# Patient Record
Sex: Female | Born: 1940
Health system: Southern US, Community
[De-identification: ages and names within clinical notes are randomized; demographics above are authoritative.]

## PROBLEM LIST (undated history)

## (undated) DIAGNOSIS — I10 Essential (primary) hypertension: Secondary | ICD-10-CM

## (undated) DIAGNOSIS — E785 Hyperlipidemia, unspecified: Secondary | ICD-10-CM

## (undated) DIAGNOSIS — M810 Age-related osteoporosis without current pathological fracture: Secondary | ICD-10-CM

## (undated) DIAGNOSIS — F419 Anxiety disorder, unspecified: Secondary | ICD-10-CM

## (undated) DIAGNOSIS — H409 Unspecified glaucoma: Secondary | ICD-10-CM

## (undated) HISTORY — DX: Essential (primary) hypertension: I10

## (undated) HISTORY — DX: Unspecified glaucoma: H40.9

## (undated) HISTORY — DX: Hyperlipidemia, unspecified: E78.5

## (undated) HISTORY — DX: Anxiety disorder, unspecified: F41.9

## (undated) HISTORY — DX: Age-related osteoporosis without current pathological fracture: M81.0

---

## 2003-01-25 ENCOUNTER — Other Ambulatory Visit: Admission: RE | Admit: 2003-01-25 | Discharge: 2003-01-25 | Payer: Self-pay | Admitting: *Deleted

## 2005-01-27 ENCOUNTER — Other Ambulatory Visit: Admission: RE | Admit: 2005-01-27 | Discharge: 2005-01-27 | Payer: Self-pay | Admitting: *Deleted

## 2007-01-30 ENCOUNTER — Other Ambulatory Visit: Admission: RE | Admit: 2007-01-30 | Discharge: 2007-01-30 | Payer: Self-pay | Admitting: *Deleted

## 2010-02-05 ENCOUNTER — Other Ambulatory Visit: Admission: RE | Admit: 2010-02-05 | Discharge: 2010-02-05 | Payer: Self-pay | Admitting: Family Medicine

## 2010-05-06 ENCOUNTER — Observation Stay (HOSPITAL_COMMUNITY): Admission: EM | Admit: 2010-05-06 | Discharge: 2010-05-07 | Payer: Self-pay | Admitting: Emergency Medicine

## 2010-05-07 ENCOUNTER — Encounter (INDEPENDENT_AMBULATORY_CARE_PROVIDER_SITE_OTHER): Payer: Self-pay | Admitting: Internal Medicine

## 2010-05-07 ENCOUNTER — Ambulatory Visit: Payer: Self-pay | Admitting: Vascular Surgery

## 2010-11-27 LAB — CARDIAC PANEL(CRET KIN+CKTOT+MB+TROPI)
CK, MB: 1.6 ng/mL (ref 0.3–4.0)
CK, MB: 1.7 ng/mL (ref 0.3–4.0)
Relative Index: INVALID (ref 0.0–2.5)
Relative Index: INVALID (ref 0.0–2.5)
Total CK: 89 U/L (ref 7–177)
Total CK: 96 U/L (ref 7–177)
Troponin I: 0.01 ng/mL (ref 0.00–0.06)
Troponin I: 0.06 ng/mL (ref 0.00–0.06)

## 2010-11-27 LAB — POCT I-STAT, CHEM 8
BUN: 26 mg/dL — ABNORMAL HIGH (ref 6–23)
Calcium, Ion: 1.17 mmol/L (ref 1.12–1.32)
Chloride: 107 mEq/L (ref 96–112)
Creatinine, Ser: 1.2 mg/dL (ref 0.4–1.2)
Glucose, Bld: 120 mg/dL — ABNORMAL HIGH (ref 70–99)
HCT: 45 % (ref 36.0–46.0)
Hemoglobin: 15.3 g/dL — ABNORMAL HIGH (ref 12.0–15.0)
Potassium: 4.2 mEq/L (ref 3.5–5.1)
Sodium: 143 mEq/L (ref 135–145)
TCO2: 28 mmol/L (ref 0–100)

## 2010-11-27 LAB — DIFFERENTIAL
Basophils Absolute: 0 10*3/uL (ref 0.0–0.1)
Basophils Relative: 0 % (ref 0–1)
Eosinophils Absolute: 0.3 10*3/uL (ref 0.0–0.7)
Eosinophils Relative: 4 % (ref 0–5)
Lymphocytes Relative: 38 % (ref 12–46)
Lymphs Abs: 3 10*3/uL (ref 0.7–4.0)
Monocytes Absolute: 0.7 10*3/uL (ref 0.1–1.0)
Monocytes Relative: 9 % (ref 3–12)
Neutro Abs: 3.8 10*3/uL (ref 1.7–7.7)
Neutrophils Relative %: 49 % (ref 43–77)

## 2010-11-27 LAB — URINALYSIS, ROUTINE W REFLEX MICROSCOPIC
Glucose, UA: NEGATIVE mg/dL
Hgb urine dipstick: NEGATIVE
Ketones, ur: 15 mg/dL — AB
Nitrite: NEGATIVE
Protein, ur: NEGATIVE mg/dL
Specific Gravity, Urine: 1.026 (ref 1.005–1.030)
Urobilinogen, UA: 0.2 mg/dL (ref 0.0–1.0)
pH: 5 (ref 5.0–8.0)

## 2010-11-27 LAB — CK TOTAL AND CKMB (NOT AT ARMC)
CK, MB: 2 ng/mL (ref 0.3–4.0)
Relative Index: 2 (ref 0.0–2.5)
Total CK: 102 U/L (ref 7–177)

## 2010-11-27 LAB — CBC
HCT: 43.7 % (ref 36.0–46.0)
Hemoglobin: 15.1 g/dL — ABNORMAL HIGH (ref 12.0–15.0)
MCH: 31.6 pg (ref 26.0–34.0)
MCHC: 34.6 g/dL (ref 30.0–36.0)
MCV: 91.4 fL (ref 78.0–100.0)
Platelets: 211 10*3/uL (ref 150–400)
RBC: 4.78 MIL/uL (ref 3.87–5.11)
RDW: 12.8 % (ref 11.5–15.5)
WBC: 7.8 10*3/uL (ref 4.0–10.5)

## 2010-11-27 LAB — POCT CARDIAC MARKERS
CKMB, poc: <1 ng/mL — ABNORMAL LOW (ref 1.0–8.0)
Myoglobin, poc: 58.5 ng/mL (ref 12–200)
Troponin i, poc: <0.05 ng/mL (ref 0.00–0.09)

## 2010-11-27 LAB — PROTIME-INR
INR: 0.95 (ref 0.00–1.49)
Prothrombin Time: 12.9 seconds (ref 11.6–15.2)

## 2010-11-27 LAB — TROPONIN I: Troponin I: 0.05 ng/mL (ref 0.00–0.06)

## 2010-11-27 LAB — APTT: aPTT: 25 seconds (ref 24–37)

## 2010-11-27 LAB — D-DIMER, QUANTITATIVE: D-Dimer, Quant: 0.75 ug/mL-FEU — ABNORMAL HIGH (ref 0.00–0.48)

## 2013-07-23 DIAGNOSIS — H401110 Primary open-angle glaucoma, right eye, stage unspecified: Secondary | ICD-10-CM | POA: Insufficient documentation

## 2014-12-06 ENCOUNTER — Ambulatory Visit (HOSPITAL_COMMUNITY): Payer: Self-pay

## 2014-12-13 ENCOUNTER — Encounter (HOSPITAL_COMMUNITY): Payer: Self-pay

## 2015-09-22 DIAGNOSIS — I10 Essential (primary) hypertension: Secondary | ICD-10-CM | POA: Diagnosis not present

## 2015-09-22 DIAGNOSIS — M810 Age-related osteoporosis without current pathological fracture: Secondary | ICD-10-CM | POA: Diagnosis not present

## 2015-09-22 DIAGNOSIS — M899 Disorder of bone, unspecified: Secondary | ICD-10-CM | POA: Diagnosis not present

## 2015-09-22 DIAGNOSIS — G250 Essential tremor: Secondary | ICD-10-CM | POA: Diagnosis not present

## 2015-09-22 DIAGNOSIS — Z7189 Other specified counseling: Secondary | ICD-10-CM | POA: Diagnosis not present

## 2015-09-22 DIAGNOSIS — H409 Unspecified glaucoma: Secondary | ICD-10-CM | POA: Diagnosis not present

## 2015-09-22 DIAGNOSIS — E782 Mixed hyperlipidemia: Secondary | ICD-10-CM | POA: Diagnosis not present

## 2015-09-22 DIAGNOSIS — R634 Abnormal weight loss: Secondary | ICD-10-CM | POA: Diagnosis not present

## 2015-11-24 DIAGNOSIS — R278 Other lack of coordination: Secondary | ICD-10-CM | POA: Diagnosis not present

## 2015-11-24 DIAGNOSIS — R030 Elevated blood-pressure reading, without diagnosis of hypertension: Secondary | ICD-10-CM | POA: Diagnosis not present

## 2015-11-24 DIAGNOSIS — R251 Tremor, unspecified: Secondary | ICD-10-CM | POA: Diagnosis not present

## 2015-12-11 DIAGNOSIS — D72820 Lymphocytosis (symptomatic): Secondary | ICD-10-CM | POA: Diagnosis not present

## 2015-12-11 DIAGNOSIS — R278 Other lack of coordination: Secondary | ICD-10-CM | POA: Diagnosis not present

## 2015-12-11 DIAGNOSIS — R251 Tremor, unspecified: Secondary | ICD-10-CM | POA: Diagnosis not present

## 2015-12-11 DIAGNOSIS — R030 Elevated blood-pressure reading, without diagnosis of hypertension: Secondary | ICD-10-CM | POA: Diagnosis not present

## 2015-12-17 DIAGNOSIS — F4320 Adjustment disorder, unspecified: Secondary | ICD-10-CM | POA: Diagnosis not present

## 2015-12-17 DIAGNOSIS — R251 Tremor, unspecified: Secondary | ICD-10-CM | POA: Diagnosis not present

## 2015-12-17 DIAGNOSIS — D72820 Lymphocytosis (symptomatic): Secondary | ICD-10-CM | POA: Diagnosis not present

## 2015-12-31 ENCOUNTER — Ambulatory Visit: Payer: PPO | Admitting: Neurology

## 2016-01-05 ENCOUNTER — Encounter: Payer: Self-pay | Admitting: Neurology

## 2016-01-05 ENCOUNTER — Ambulatory Visit (INDEPENDENT_AMBULATORY_CARE_PROVIDER_SITE_OTHER): Payer: PPO | Admitting: Neurology

## 2016-01-05 VITALS — BP 138/80 | HR 70 | Resp 16 | Ht 58.5 in | Wt 114.0 lb

## 2016-01-05 DIAGNOSIS — R251 Tremor, unspecified: Secondary | ICD-10-CM

## 2016-01-05 DIAGNOSIS — R258 Other abnormal involuntary movements: Secondary | ICD-10-CM | POA: Diagnosis not present

## 2016-01-05 NOTE — Patient Instructions (Addendum)
We will do a brain scan, called MRI and call you with the test results. We will have to schedule you for this on a separate date. This test requires authorization from your insurance, and we will take care of the insurance process.  We will consider low dose parkinson's medication down the road, and there is another brain scan called DaT scan that helps with diagnosis of parkinsonian disorders, which we will consider.    I am not exactly sure as to the nature of your tremor. Repeat evaluation and following you clinically is important, as discussed.

## 2016-01-05 NOTE — Progress Notes (Signed)
Subjective:    Patient ID: Selena Branch is a 75 y.o. female.  HPI     Star Age, MD, PhD [facility] [address] [place], [place] [address]  Dear Dr. Addison Lank,    I saw your patient, Selena Branch, upon your kind request in my neurologic clinic today for initial consultation of her tremors. The patient is unaccompanied today. As you know, Ms. Blakley is a 75 year old right-handed woman with an underlying medical history of hypertension, hyperlipidemia, osteoporosis, glaucoma, and depression, who reports a bilateral hand and lip tremor for the past several months, starting late last year in December, when she went to the dentist, she was noted to have jaw and lip tremor. Of note she has had a right hand tremor for the past few years. She recalls having had an incident, potentially a tendon injury to her right arm when she was playing with her then 21 or 50 year old granddaughter. This happened about 8 years ago. Granddaughter tripped and fell while they were both sitting on the floor playing and she fell on the patient's right arm. No bony injuries were sustained. She has had a right arm and hand tremor since then, particularly affecting her right thumb. In January she started noticing left hand tremor. She feels that this came on fairly suddenly. She has had some fine motor problems in grip strength weakness she feels. She tries to stay active. She is having more stress because of her husband's health. He is 16 years old and has dementia. She also had to take care of him when he had an infection recently. She was not sleeping well at the time. Sleep in general is a little better but she is not a very deep sleeper she admits. She has cut back on sodas and has increased her water intake. She denies any history of dream enactments. She denies any snoring. She is retired. She was a bookkeeper. She does not smoke or drink alcohol. She drinks caffeine in the  form of coffee, 2 cups per day. She lives with her husband. She has one daughter, who lives in [place] and 1 granddaughter. I reviewed your office note from 12/17/2015, which you kindly included. Of note, her stress and anxiety and mood irritability she was started on sertraline low-dose a few weeks ago, on 12/17/2015 this was increased to 50 mg once daily. Her tremors may have become worse after starting this perhaps , definitely no better.   She has no FHx of PD or ET. She has no history of taking antipsychotic medication or Reglan. She had lab work through your office on 11/24/15, including TSH, RPR, BMP and CBC with differential. Labs were okay, per patient. I did not have results today. She had a CTH wo contrast on 05/06/10 for syncopal event: IMPRESSION: Normal for age.  Her Past Medical History Is Significant For: Past Medical History  Diagnosis Date  . Hypertension   . Hyperlipemia   . Glaucoma   . Anxiety   . Osteoporosis     Her Past Surgical History Is Significant For: No past surgical history on file.  Her Family History Is Significant For: Family History  Problem Relation Age of Onset  . Stroke Mother   . Lung cancer Father     Her Social History Is Significant For: Social History   Social History  . Marital Status: Married    Spouse Name: N/A  . Number of Children: 1  . Years of  Education: HS   Occupational History  . Retired    Social History Main Topics  . Smoking status: Never Smoker   . Smokeless tobacco: None  . Alcohol Use: No  . Drug Use: No  . Sexual Activity: Not Asked   Other Topics Concern  . None   Social History Narrative   Drinks 2 cups of coffee    Her Allergies Are:  Allergies  Allergen Reactions  . Codeine Nausea Only  :   Her Current Medications Are:  Outpatient Encounter Prescriptions as of 01/05/2016  Medication Sig  . amLODipine (NORVASC) 5 MG tablet   . atorvastatin (LIPITOR) 40 MG tablet Take by mouth.  . brimonidine  (ALPHAGAN) 0.15 % ophthalmic solution   . Cholecalciferol (VITAMIN D3) 2000 units TABS Take by mouth.  . dorzolamide-timolol (COSOPT) 22.3-6.8 MG/ML ophthalmic solution   . fenoprofen (NALFON) 600 MG TABS tablet Take 600 mg by mouth.  . furosemide (LASIX) 40 MG tablet Take by mouth.  . ibandronate (BONIVA) 150 MG tablet   . latanoprost (XALATAN) 0.005 % ophthalmic solution   . Multiple Vitamin (MULTIVITAMIN) capsule Take 1 capsule by mouth daily.  . quinapril (ACCUPRIL) 40 MG tablet Take by mouth.  . sertraline (ZOLOFT) 25 MG tablet    No facility-administered encounter medications on file as of 01/05/2016.  :   Review of Systems:  Out of a complete 14 point review of systems, all are reviewed and negative with the exception of these symptoms as listed below:  Review of Systems  Neurological:       Patient reports that she started having a lower jaw tremor in 08/2015. Then started having tremor in both hands in 09/2015. States that she had a R arm injury several years ago which left her with some tremor in R arm.     Objective:  Neurologic Exam  Physical Exam Physical Examination:   Filed Vitals:   01/05/16 1055  BP: 138/80  Pulse: 70  Resp: 16    General Examination: The patient is a very pleasant 75 y.o. female in no acute distress. She appears well-developed and well-nourished and well groomed. She is mildly anxious appearing.  HEENT: Normocephalic, atraumatic, pupils are equal, round and reactive to light and accommodation. Funduscopic exam is normal with sharp disc margins noted. Mild b/l cataracts, b/l contact lenses in place. Extraocular tracking is good without limitation to gaze excursion or nystagmus noted. Normal smooth pursuit is noted. Hearing is grossly intact. Face is symmetric with normal facial animation and normal facial sensation. Speech is clear with no dysarthria noted. There is no hypophonia. There is fairly prominent lower lip and jaw tremor. Neck perhaps  mildly rigid with full range of passive and active motion. There are no carotid bruits on auscultation. Oropharynx exam reveals: mild mouth dryness, adequate dental hygiene and mild airway crowding, due to smaller airway entry and longer appearing tongue. Mallampati is class II. Tongue protrudes centrally and palate elevates symmetrically.   Chest: Clear to auscultation without wheezing, rhonchi or crackles noted.  Heart: S1+S2+0, regular and normal without murmurs, rubs or gallops noted.   Abdomen: Soft, non-tender and non-distended with normal bowel sounds appreciated on auscultation.  Extremities: There is no pitting edema in the distal lower extremities bilaterally. Pedal pulses are intact.  Skin: Warm and dry without trophic changes noted. There are no varicose veins, only mild in the left leg, otherwise mild spider veins.  Musculoskeletal: exam reveals no obvious joint deformities, tenderness or joint swelling or  erythema.   Neurologically:  Mental status: The patient is awake, alert and oriented in all 4 spheres. Her immediate and remote memory, attention, language skills and fund of knowledge are appropriate. There is no evidence of aphasia, agnosia, apraxia or anomia. Speech is clear with normal prosody and enunciation. Thought process is linear. Mood is normal and affect is normal.  Cranial nerves II - XII are as described above under HEENT exam. In addition: shoulder shrug is normal with equal shoulder height noted. Motor exam: Normal bulk, strength and tone is noted, perhaps slight catch in the right upper extremity but she does have a hard time relaxing. There is no drift, or rebound. She has a mild resting tremor fairly fast infrequency, faster than typically seen and Parkinson's and small amplitude, in both hands, left a little worse than right. She has a mild to moderate postural tremor, left seems a little worse than right, mild action tremor. On fine motor skill testing with finger  taps she has mild impairment on the left, minimal on the right. Foot taps and foot agility are near normal for age, perhaps slightly impaired on the left side. She stands up with no difficulty. She denies lightheadedness or vertigo symptoms with standing and can stand narrow based. Romberg is negative. Reflexes are 1-2+ bilaterally. No intention tremor, no past pointing.   On Archimedes spiral drawing she has no significant trembling with the right hand, mild trembling with the left is noted, no larger or coarse movements, handwriting is legible and minimally tremulous, perhaps slightly smaller. e testing. Heel to shin is unremarkable bilaterally. There is no truncal or gait ataxia.  Sensory exam: intact to light touch, pinprick, vibration, temperature sense in the upper and lower extremities.   Posture is age-appropriate. She walks with normal stride length and fairly normal pace, slight decrease in arm swing, right more than left. She had slight insecurity with turned once, no freezing, no festination, no shuffling noted. Tandem walk slightly difficult for her.  Assessment and Plan:   In summary, ANDERA RAGAIN is a very pleasant 75 y.o.-year old female with an underlying medical history of hypertension, hyperlipidemia, osteoporosis, glaucoma, and depression, whopresents for initial consultation of a lip and bilateral hand tremor, lip tremor since December 2016, hand tremor on the left noted since January 2017 but overall a hand tremor on the right noted several years ago, fairly stable as I understand, starting perhaps 8 years ago or so. 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 She  does not have a history and physical exam telltale for essential tremor. This would be highly unusual. Thyroid function per her verbal report recently normal. She had a head CT several years ago which was non-acute and non-focal. I would like to proceed with a brain MRI with and without contrast. She does report a fairly quick onset of tremors but admits that she has had a right hand tremor for years. I would like to follow her clinically and we did brush upon parkinsonism and potential symptomatic treatments. We mutually agreed to hold off on medications. She is reluctant to try anything new at this time. Of note, she could not tolerate  the higher dose of sertraline due to sleepiness and dizziness reported and she is back on 25 mg once daily. She is advised that we could also consider another brain scan called DaT scan in the near future which we utilize sometimes for help diagnostically for tremor disorders and parkinsonism. I would like to see her back in 3 months for recheck and we will be in touch over the phone as to her MRI results. I answered all her questions today and the patient was in agreement.   Thank you very much for allowing me to participate in the care of this nice patient. If I can be of any further assistance to you please do not hesitate to call me at [phone].  Sincerely,   Star Age, MD, PhD

## 2016-01-08 DIAGNOSIS — H401121 Primary open-angle glaucoma, left eye, mild stage: Secondary | ICD-10-CM | POA: Diagnosis not present

## 2016-01-08 DIAGNOSIS — H4010X1 Unspecified open-angle glaucoma, mild stage: Secondary | ICD-10-CM | POA: Diagnosis not present

## 2016-01-08 DIAGNOSIS — H401131 Primary open-angle glaucoma, bilateral, mild stage: Secondary | ICD-10-CM | POA: Diagnosis not present

## 2016-01-14 ENCOUNTER — Ambulatory Visit
Admission: RE | Admit: 2016-01-14 | Discharge: 2016-01-14 | Disposition: A | Discharge status: Home / Self Care | Payer: PPO | Source: Ambulatory Visit | Attending: Neurology | Admitting: Neurology

## 2016-01-14 DIAGNOSIS — R251 Tremor, unspecified: Secondary | ICD-10-CM | POA: Diagnosis not present

## 2016-01-14 DIAGNOSIS — R258 Other abnormal involuntary movements: Principal | ICD-10-CM

## 2016-01-14 MED ORDER — GADOBENATE DIMEGLUMINE 529 MG/ML IV SOLN
10.0000 mL | Freq: Once | INTRAVENOUS | Status: AC | PRN
  Administered 2016-01-14: 10 mL via INTRAVENOUS

## 2016-01-16 NOTE — Progress Notes (Signed)
Quick Note:  Please call patient regarding the recent brain MRI: The brain scan showed a normal structure of the brain and moderate volume loss which we call atrophy. There were changes in the deeper structures of the brain, which we call white matter changes or microvascular changes. These were reported as minimal or mild in Her case. These are tiny white spots, that occur with time and are seen in a variety of conditions, including with normal aging, chronic hypertension, chronic headaches, especially migraine HAs, chronic diabetes, chronic hyperlipidemia. These are not strokes and no mass or lesion were seen which is reassuring. Again, there were no acute findings, such as a stroke, or mass or blood products. No further action is required on this test at this time, other than re-enforcing the importance of good blood pressure control, good cholesterol control, good blood sugar control, and weight management. Please remind patient to keep any upcoming appointments or tests and to call us with any interim questions, concerns, problems or updates. Thanks,  Star Age, MD, PhD    ______

## 2016-01-19 ENCOUNTER — Telehealth: Payer: Self-pay

## 2016-01-19 DIAGNOSIS — R251 Tremor, unspecified: Secondary | ICD-10-CM

## 2016-01-19 DIAGNOSIS — R258 Other abnormal involuntary movements: Principal | ICD-10-CM

## 2016-01-19 NOTE — Telephone Encounter (Signed)
I will request a DaT scan for further diagnostic help. This is a special scan for which we have to refer her out. I like to send patients to [facility], if okay with her.

## 2016-01-19 NOTE — Telephone Encounter (Signed)
I spoke to patient and she is willing to proceed with DaT scan but is concerned about copay (if she can afford it or not). Once we get precert for scan please advise patient of copay.

## 2016-01-19 NOTE — Telephone Encounter (Signed)
Called and spoke to patient relayed I will know how much she has to pay in 2-5 business day's her insurance company has been contacted.  Patient want's to know what's the difference between MRI and DAT Scan ?

## 2016-01-19 NOTE — Telephone Encounter (Signed)
Diana: Please call patient back re: DaT scan: This is a specialized brain scan designed to help with diagnosis of tremor disorders. A radioactive marker gets injected and the uptake is measured in the brain and compared to normal controls and right side is compared to the left, a change in uptake can help with diagnosis of certain tremor disorders. A brain MRI on the other hand is a brain scan that helps look at the brain structure in more detail overall and look for age-related changes, blood vessel related changes and look for stroke and volume loss which we call atrophy.

## 2016-01-19 NOTE — Telephone Encounter (Signed)
Patient is aware of results and recommendations. She asks to proceed with further testing to find cause or start medications to treat her symptoms. How would you like to proceed?

## 2016-01-19 NOTE — Telephone Encounter (Signed)
-----   Message from Star Age, MD sent at 01/16/2016 12:27 PM EDT ----- Please call patient regarding the recent brain MRI: The brain scan showed a normal structure of the brain and moderate volume loss which we call atrophy. There were changes in the deeper structures of the brain, which we call white matter changes or microvascular changes. These were reported as minimal or mild in Her case. These are tiny white spots, that occur with time and are seen in a variety of conditions, including with normal aging, chronic hypertension, chronic headaches, especially migraine HAs, chronic diabetes, chronic hyperlipidemia. These are not strokes and no mass or lesion were seen which is reassuring. Again, there were no acute findings, such as a stroke, or mass or blood products. No further action is required on this test at this time, other than re-enforcing the importance of good blood pressure control, good cholesterol control, good blood sugar control, and weight management. Please remind patient to keep any upcoming appointments or tests and to call us with any interim questions, concerns, problems or updates. Thanks,  Star Age, MD, PhD

## 2016-01-20 NOTE — Telephone Encounter (Signed)
I spoke to patient and went over information below. She voiced understand and stated that the information was helpful. I advised her to call back if any further questions.

## 2016-01-22 DIAGNOSIS — H401131 Primary open-angle glaucoma, bilateral, mild stage: Secondary | ICD-10-CM | POA: Diagnosis not present

## 2016-01-29 NOTE — Telephone Encounter (Signed)
Patient was approved for DAT Scan approval number [id number] - [id number] -  Dates 01/28/2016 -07/28/2016 . Copay will be $ 200.00 and they can work with her on payment plan. I will call patient with details.

## 2016-01-29 NOTE — Telephone Encounter (Signed)
When I spoke to patient she is going to wait until the end of June to have . Patient is approved until 07/28/2016.

## 2016-02-05 DIAGNOSIS — H401122 Primary open-angle glaucoma, left eye, moderate stage: Secondary | ICD-10-CM | POA: Diagnosis not present

## 2016-02-05 DIAGNOSIS — H401112 Primary open-angle glaucoma, right eye, moderate stage: Secondary | ICD-10-CM | POA: Diagnosis not present

## 2016-03-23 DIAGNOSIS — Z0000 Encounter for general adult medical examination without abnormal findings: Secondary | ICD-10-CM | POA: Diagnosis not present

## 2016-03-23 DIAGNOSIS — E782 Mixed hyperlipidemia: Secondary | ICD-10-CM | POA: Diagnosis not present

## 2016-03-23 DIAGNOSIS — M810 Age-related osteoporosis without current pathological fracture: Secondary | ICD-10-CM | POA: Diagnosis not present

## 2016-03-23 DIAGNOSIS — I10 Essential (primary) hypertension: Secondary | ICD-10-CM | POA: Diagnosis not present

## 2016-03-23 DIAGNOSIS — F4320 Adjustment disorder, unspecified: Secondary | ICD-10-CM | POA: Diagnosis not present

## 2016-03-23 DIAGNOSIS — D72820 Lymphocytosis (symptomatic): Secondary | ICD-10-CM | POA: Diagnosis not present

## 2016-03-23 DIAGNOSIS — H409 Unspecified glaucoma: Secondary | ICD-10-CM | POA: Diagnosis not present

## 2016-03-23 DIAGNOSIS — H6121 Impacted cerumen, right ear: Secondary | ICD-10-CM | POA: Diagnosis not present

## 2016-03-23 DIAGNOSIS — R030 Elevated blood-pressure reading, without diagnosis of hypertension: Secondary | ICD-10-CM | POA: Diagnosis not present

## 2016-03-23 DIAGNOSIS — R278 Other lack of coordination: Secondary | ICD-10-CM | POA: Diagnosis not present

## 2016-03-23 DIAGNOSIS — E559 Vitamin D deficiency, unspecified: Secondary | ICD-10-CM | POA: Diagnosis not present

## 2016-03-23 DIAGNOSIS — R251 Tremor, unspecified: Secondary | ICD-10-CM | POA: Diagnosis not present

## 2016-03-25 ENCOUNTER — Other Ambulatory Visit: Payer: Self-pay | Admitting: Family Medicine

## 2016-03-25 DIAGNOSIS — D72820 Lymphocytosis (symptomatic): Secondary | ICD-10-CM | POA: Diagnosis not present

## 2016-03-25 DIAGNOSIS — Z0000 Encounter for general adult medical examination without abnormal findings: Secondary | ICD-10-CM | POA: Diagnosis not present

## 2016-03-25 DIAGNOSIS — M810 Age-related osteoporosis without current pathological fracture: Secondary | ICD-10-CM | POA: Diagnosis not present

## 2016-03-25 DIAGNOSIS — E782 Mixed hyperlipidemia: Secondary | ICD-10-CM | POA: Diagnosis not present

## 2016-03-25 DIAGNOSIS — I10 Essential (primary) hypertension: Secondary | ICD-10-CM | POA: Diagnosis not present

## 2016-03-25 DIAGNOSIS — F4320 Adjustment disorder, unspecified: Secondary | ICD-10-CM | POA: Diagnosis not present

## 2016-03-25 DIAGNOSIS — G250 Essential tremor: Secondary | ICD-10-CM | POA: Diagnosis not present

## 2016-03-25 DIAGNOSIS — E559 Vitamin D deficiency, unspecified: Secondary | ICD-10-CM | POA: Diagnosis not present

## 2016-03-25 DIAGNOSIS — Z01419 Encounter for gynecological examination (general) (routine) without abnormal findings: Secondary | ICD-10-CM | POA: Diagnosis not present

## 2016-03-25 DIAGNOSIS — H409 Unspecified glaucoma: Secondary | ICD-10-CM | POA: Diagnosis not present

## 2016-03-25 DIAGNOSIS — Z1231 Encounter for screening mammogram for malignant neoplasm of breast: Secondary | ICD-10-CM

## 2016-04-02 ENCOUNTER — Ambulatory Visit: Payer: PPO

## 2016-04-06 ENCOUNTER — Ambulatory Visit: Payer: PPO

## 2016-04-07 ENCOUNTER — Encounter: Payer: Self-pay | Admitting: Neurology

## 2016-04-07 ENCOUNTER — Ambulatory Visit (INDEPENDENT_AMBULATORY_CARE_PROVIDER_SITE_OTHER): Payer: PPO | Admitting: Neurology

## 2016-04-07 VITALS — BP 148/72 | HR 72 | Resp 16 | Ht 58.5 in | Wt 115.0 lb

## 2016-04-07 DIAGNOSIS — R258 Other abnormal involuntary movements: Secondary | ICD-10-CM

## 2016-04-07 DIAGNOSIS — R251 Tremor, unspecified: Secondary | ICD-10-CM

## 2016-04-07 NOTE — Patient Instructions (Signed)
We will continue to monitor your symptoms. Let's check back in about 6 months, sooner if you need to.  We may pursue the DaT scan at a later date.  Try to drink more water, stay active physically.

## 2016-04-07 NOTE — Progress Notes (Signed)
Subjective:    Patient ID: Selena Branch is a 75 y.o. female.  HPI      Interim history:   Selena Branch is a 75 year old right-handed woman with an underlying medical history of hypertension, hyperlipidemia, osteoporosis, glaucoma, and depression, who presents for follow-up consultation of Selena Branch tremors including hand tremor and lip tremor. The patient is unaccompanied today. I first met Selena Branch on 01/05/2016, at which time Selena Branch reported bilateral hand and lip tremors for several months, probably starting around Selena Branch in December 2016 and Selena Branch was noted to have a lip tremor by Selena Branch dentist at the time. I felt Selena Branch had some parkinsonism in Selena Branch examination and presentation. I suggested we proceed with a brain MRI. We also talked about pursuing a DaT scan in the future for help with diagnostic clarification. I did not suggest starting Selena Branch on any new medications at the time.  Selena Branch had a brain MRI with and without contrast on 01/14/2016:    IMPRESSION:  Mildly abnormal MRI brain (without) demonstrating: 1. Moderate perisylvian atrophy. 2. Few scattered subcortical foci of non-specific gliosis.  3. No acute findings.  We called Selena Branch with Selena Branch test results.   I also suggested we pursue the DaT scan. Patient requested to hold off.  Today, 04/07/2016: Selena Branch reports doing better, tremors better, especially since anxiety is less, on sertraline low dose. Selena Branch has had no new symptoms. Is aware, that   Previously:   01/05/2016: Selena Branch reports a bilateral hand and lip tremor for the past several months, starting late last year in December, when Selena Branch went to the dentist, Selena Branch was noted to have jaw and lip tremor. Of note Selena Branch has had a right hand tremor for the past few years. Selena Branch recalls having had an incident, potentially a tendon injury to Selena Branch right arm when Selena Branch was playing with Selena Branch then 75 or 5 year old granddaughter. This happened about 8 years ago. Granddaughter tripped and fell while they were both sitting on the floor  playing and Selena Branch fell on the patient's right arm. No bony injuries were sustained. Selena Branch has had a right arm and hand tremor since then, particularly affecting Selena Branch right thumb. In January Selena Branch started noticing left hand tremor. Selena Branch feels that this came on fairly suddenly. Selena Branch has had some fine motor problems in grip strength weakness Selena Branch feels. Selena Branch tries to stay active. Selena Branch is having more stress because of Selena Branch husband's health. He is 62 years old and has dementia. Selena Branch also had to take care of him when he had an infection recently. Selena Branch was not sleeping well at the time. Sleep in general is a little better but Selena Branch is not a very deep sleeper Selena Branch admits. Selena Branch has cut back on sodas and has increased Selena Branch water intake. Selena Branch denies any history of dream enactments. Selena Branch denies any snoring. Selena Branch is retired. 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 Selena Branch does not smoke or drink alcohol. Selena Branch drinks caffeine in the form of coffee, 2 cups per day. Selena Branch lives with Selena Branch husband. Selena Branch has one daughter, who lives in [place] and 1 granddaughter. I reviewed your office note from 12/17/2015, which you kindly included. Of note, Selena Branch stress and anxiety and mood irritability Selena Branch was started on sertraline low-dose a few weeks ago, on 12/17/2015 this was increased to 50 mg once daily. Selena Branch tremors may have become worse after starting this perhaps , definitely no better.   Selena Branch has no FHx of PD or ET. Selena Branch has no history of taking antipsychotic medication or Reglan.  Selena Branch had lab work through your office on 11/24/15, including TSH, RPR, BMP and CBC with differential. Labs were okay, per patient. I did not have results today. Selena Branch had a CTH wo contrast on 05/06/10 for syncopal event: IMPRESSION: Normal for age.  Selena Branch Past Medical History Is Significant For: Past Medical History:  Diagnosis Date  . Anxiety   . Glaucoma   . Hyperlipemia   . Hypertension   . Osteoporosis     Selena Branch Past Surgical History Is Significant For: No past surgical history on file.  Selena Branch Family  History Is Significant For: Family History  Problem Relation Age of Onset  . Stroke Mother   . Lung cancer Father     Selena Branch Social History Is Significant For: Social History   Social History  . Marital status: Married    Spouse name: N/A  . Number of children: 1  . Years of education: HS   Occupational History  . Retired    Social History Main Topics  . Smoking status: Never Smoker  . Smokeless tobacco: None  . Alcohol use No  . Drug use: No  . Sexual activity: Not Asked   Other Topics Concern  . None   Social History Narrative   Drinks 2 cups of coffee    Selena Branch Allergies Are:  Allergies  Allergen Reactions  . Codeine Nausea Only  :   Selena Branch Current Medications Are:  Outpatient Encounter Prescriptions as of 04/07/2016  Medication Sig  . amLODipine (NORVASC) 5 MG tablet   . atorvastatin (LIPITOR) 40 MG tablet Take by mouth.  . brimonidine (ALPHAGAN) 0.15 % ophthalmic solution   . Cholecalciferol (VITAMIN D3) 2000 units TABS Take by mouth.  . dorzolamide-timolol (COSOPT) 22.3-6.8 MG/ML ophthalmic solution   . fenoprofen (NALFON) 600 MG TABS tablet Take 600 mg by mouth.  . furosemide (LASIX) 40 MG tablet Take by mouth.  . ibandronate (BONIVA) 150 MG tablet   . latanoprost (XALATAN) 0.005 % ophthalmic solution   . Multiple Vitamin (MULTIVITAMIN) capsule Take 1 capsule by mouth daily.  . quinapril (ACCUPRIL) 40 MG tablet Take by mouth.  . sertraline (ZOLOFT) 25 MG tablet    No facility-administered encounter medications on file as of 04/07/2016.   :  Review of Systems:  Out of a complete 14 point review of systems, all are reviewed and negative with the exception of these symptoms as listed below:  Review of Systems  Neurological:       No new concerns per patient.     Objective:  Neurologic Exam  Physical Exam Physical Examination:   Vitals:   04/07/16 1256  BP: (!) 148/72  Pulse: 72  Resp: 16    General Examination: The patient is a very pleasant 75  y.o. female in no acute distress. Selena Branch appears well-developed and well-nourished and well groomed. Selena Branch is less anxious appearing.  HEENT: Normocephalic, atraumatic, pupils are equal, round and reactive to light and accommodation. Funduscopic exam is normal with sharp disc margins noted. Mild b/l cataracts, b/l contact lenses in place. Extraocular tracking is good without limitation to gaze excursion or nystagmus noted. Normal smooth pursuit is noted. Hearing is grossly intact. Face is symmetric with normal facial animation and normal facial sensation. Speech is clear with no dysarthria noted. There is no hypophonia. There is fairly prominent lower lip and jaw tremor. Neck perhaps mildly rigid with full range of passive and active motion. There are no carotid bruits on auscultation. Oropharynx exam reveals: mild mouth dryness, adequate  dental hygiene and mild airway crowding, due to smaller airway entry and longer appearing tongue. Mallampati is class II. Tongue protrudes centrally and palate elevates symmetrically.   Chest: Clear to auscultation without wheezing, rhonchi or crackles noted.  Heart: S1+S2+0, regular and normal without murmurs, rubs or gallops noted.   Abdomen: Soft, non-tender and non-distended with normal bowel sounds appreciated on auscultation.  Extremities: There is no pitting edema in the distal lower extremities bilaterally. Pedal pulses are intact.  Skin: Warm and dry without trophic changes noted. There are no varicose veins, only mild in the left leg, otherwise mild spider veins.  Musculoskeletal: exam reveals no obvious joint deformities, tenderness or joint swelling or erythema.   Neurologically:  Mental status: The patient is awake, alert and oriented in all 4 spheres. Selena Branch immediate and remote memory, attention, language skills and fund of knowledge are appropriate. There is no evidence of aphasia, agnosia, apraxia or anomia. Speech is clear with normal prosody and  enunciation. Thought process is linear. Mood is normal and affect is normal.  Cranial nerves II - XII are as described above under HEENT exam. In addition: shoulder shrug is normal with equal shoulder height noted. Motor exam: Normal bulk, strength and tone is noted, perhaps slight catch in the right upper extremity but Selena Branch does have a hard time relaxing. There is no drift, or rebound. Selena Branch has a mild resting tremor fairly fast infrequency, faster than typically seen and Parkinson's and small amplitude, in both hands, left a little worse than right. Selena Branch has a mild to moderate postural tremor, left seems a little worse than right, mild action tremor. On fine motor skill testing with finger taps Selena Branch has mild impairment on the left, minimal on the right. Foot taps and foot agility are near normal for age, perhaps slightly impaired on the left side. Selena Branch stands up with no difficulty. Selena Branch denies lightheadedness or vertigo symptoms with standing and can stand narrow based. Romberg is negative. Reflexes are 1-2+ bilaterally. No intention tremor, no past pointing.   (first visit: On Archimedes spiral drawing Selena Branch has no significant trembling with the right hand, mild trembling with the left is noted, no larger or coarse movements, handwriting is legible and minimally tremulous, perhaps slightly smaller).  Heel to shin is unremarkable bilaterally. There is no truncal or gait ataxia.  Sensory exam: intact to light touch in the upper and lower extremities.   Posture is age-appropriate. Selena Branch walks with normal stride length and fairly normal pace, slight decrease in arm swing, right more than left. Selena Branch had slight insecurity with turns, no freezing, no festination, no shuffling noted. Tandem walk slightly difficult for Selena Branch.  Assessment and Plan:   In summary, IZELLA YBANEZ is a very pleasant 75 year old female with an underlying medical history of hypertension, hyperlipidemia, osteoporosis, glaucoma, and depression, who  presents for follow up consultation of Selena Branch lip and bilateral hand tremor, lip tremor since December 2016, hand tremor on the left noted since January 2017 but overall a History of hand tremor on the right noted several years ago, fairly stable for years, maybe as long as 8 years or so. 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 We talked about Selena Branch brain MRI results, Selena Branch had fairly age-appropriate findings. Selena Branch feels a little improved after Selena Branch has been on anxiety medication, we mutually agreed to monitor Selena Branch symptoms and Selena Branch exam. We may pursue a DaT scan in the future,  Selena Branch has been approved till November of this year. We mutually agreed to reevaluate things in about 6 months, sooner as needed. I advised Selena Branch to stay well-hydrated and exercise regularly. Selena Branch exam looks stable or slightly better from last time. We talked about tremor triggers again today. Selena Branch has noted a flareup of tremors when Selena Branch is stressed out or anxious. In addition, if Selena Branch were to be dehydrated of not fully rested, or hypoglycemic Selena Branch would notice an increase in Selena Branch tremors I explained to Selena Branch. I answered all Selena Branch questions today and Selena Branch was in agreement with the plan.  I spent 25 minutes in total face-to-face time with the patient, more than 50% of which was spent in counseling and coordination of care, reviewing test results, reviewing medication and discussing or reviewing the diagnosis of tremors, the prognosis and treatment options.

## 2016-04-12 DIAGNOSIS — Z1231 Encounter for screening mammogram for malignant neoplasm of breast: Secondary | ICD-10-CM | POA: Diagnosis not present

## 2016-06-03 DIAGNOSIS — H401132 Primary open-angle glaucoma, bilateral, moderate stage: Secondary | ICD-10-CM | POA: Diagnosis not present

## 2016-06-03 DIAGNOSIS — H5213 Myopia, bilateral: Secondary | ICD-10-CM | POA: Diagnosis not present

## 2016-06-03 DIAGNOSIS — H52223 Regular astigmatism, bilateral: Secondary | ICD-10-CM | POA: Diagnosis not present

## 2016-09-28 DIAGNOSIS — F4320 Adjustment disorder, unspecified: Secondary | ICD-10-CM | POA: Diagnosis not present

## 2016-09-28 DIAGNOSIS — I10 Essential (primary) hypertension: Secondary | ICD-10-CM | POA: Diagnosis not present

## 2016-09-28 DIAGNOSIS — H409 Unspecified glaucoma: Secondary | ICD-10-CM | POA: Diagnosis not present

## 2016-09-28 DIAGNOSIS — E559 Vitamin D deficiency, unspecified: Secondary | ICD-10-CM | POA: Diagnosis not present

## 2016-09-28 DIAGNOSIS — E782 Mixed hyperlipidemia: Secondary | ICD-10-CM | POA: Diagnosis not present

## 2016-09-28 DIAGNOSIS — G250 Essential tremor: Secondary | ICD-10-CM | POA: Diagnosis not present

## 2016-09-28 DIAGNOSIS — M810 Age-related osteoporosis without current pathological fracture: Secondary | ICD-10-CM | POA: Diagnosis not present

## 2016-09-28 DIAGNOSIS — D72820 Lymphocytosis (symptomatic): Secondary | ICD-10-CM | POA: Diagnosis not present

## 2016-10-07 DIAGNOSIS — H401131 Primary open-angle glaucoma, bilateral, mild stage: Secondary | ICD-10-CM | POA: Diagnosis not present

## 2016-10-11 ENCOUNTER — Ambulatory Visit: Payer: PPO | Admitting: Neurology

## 2017-02-01 DIAGNOSIS — H401131 Primary open-angle glaucoma, bilateral, mild stage: Secondary | ICD-10-CM | POA: Diagnosis not present

## 2017-04-05 DIAGNOSIS — E559 Vitamin D deficiency, unspecified: Secondary | ICD-10-CM | POA: Diagnosis not present

## 2017-04-05 DIAGNOSIS — E782 Mixed hyperlipidemia: Secondary | ICD-10-CM | POA: Diagnosis not present

## 2017-04-05 DIAGNOSIS — H409 Unspecified glaucoma: Secondary | ICD-10-CM | POA: Diagnosis not present

## 2017-04-05 DIAGNOSIS — I10 Essential (primary) hypertension: Secondary | ICD-10-CM | POA: Diagnosis not present

## 2017-04-05 DIAGNOSIS — Z0000 Encounter for general adult medical examination without abnormal findings: Secondary | ICD-10-CM | POA: Diagnosis not present

## 2017-04-05 DIAGNOSIS — M810 Age-related osteoporosis without current pathological fracture: Secondary | ICD-10-CM | POA: Diagnosis not present

## 2017-04-05 DIAGNOSIS — F4320 Adjustment disorder, unspecified: Secondary | ICD-10-CM | POA: Diagnosis not present

## 2017-04-05 DIAGNOSIS — D72820 Lymphocytosis (symptomatic): Secondary | ICD-10-CM | POA: Diagnosis not present

## 2017-04-05 DIAGNOSIS — G250 Essential tremor: Secondary | ICD-10-CM | POA: Diagnosis not present

## 2017-04-08 DIAGNOSIS — I10 Essential (primary) hypertension: Secondary | ICD-10-CM | POA: Diagnosis not present

## 2017-04-08 DIAGNOSIS — M810 Age-related osteoporosis without current pathological fracture: Secondary | ICD-10-CM | POA: Diagnosis not present

## 2017-04-08 DIAGNOSIS — D72820 Lymphocytosis (symptomatic): Secondary | ICD-10-CM | POA: Diagnosis not present

## 2017-04-08 DIAGNOSIS — H409 Unspecified glaucoma: Secondary | ICD-10-CM | POA: Diagnosis not present

## 2017-04-08 DIAGNOSIS — Z1389 Encounter for screening for other disorder: Secondary | ICD-10-CM | POA: Diagnosis not present

## 2017-04-08 DIAGNOSIS — G250 Essential tremor: Secondary | ICD-10-CM | POA: Diagnosis not present

## 2017-04-08 DIAGNOSIS — Z0000 Encounter for general adult medical examination without abnormal findings: Secondary | ICD-10-CM | POA: Diagnosis not present

## 2017-04-08 DIAGNOSIS — F4320 Adjustment disorder, unspecified: Secondary | ICD-10-CM | POA: Diagnosis not present

## 2017-04-08 DIAGNOSIS — E559 Vitamin D deficiency, unspecified: Secondary | ICD-10-CM | POA: Diagnosis not present

## 2017-04-08 DIAGNOSIS — E782 Mixed hyperlipidemia: Secondary | ICD-10-CM | POA: Diagnosis not present

## 2017-05-25 DIAGNOSIS — H52223 Regular astigmatism, bilateral: Secondary | ICD-10-CM | POA: Diagnosis not present

## 2017-05-25 DIAGNOSIS — H524 Presbyopia: Secondary | ICD-10-CM | POA: Diagnosis not present

## 2017-05-25 DIAGNOSIS — H5213 Myopia, bilateral: Secondary | ICD-10-CM | POA: Diagnosis not present

## 2017-05-25 DIAGNOSIS — H401132 Primary open-angle glaucoma, bilateral, moderate stage: Secondary | ICD-10-CM | POA: Diagnosis not present

## 2017-05-26 DIAGNOSIS — M8588 Other specified disorders of bone density and structure, other site: Secondary | ICD-10-CM | POA: Diagnosis not present

## 2017-06-17 DIAGNOSIS — H401132 Primary open-angle glaucoma, bilateral, moderate stage: Secondary | ICD-10-CM | POA: Diagnosis not present

## 2017-06-17 DIAGNOSIS — H5340 Unspecified visual field defects: Secondary | ICD-10-CM | POA: Diagnosis not present

## 2017-06-17 DIAGNOSIS — H4051X1 Glaucoma secondary to other eye disorders, right eye, mild stage: Secondary | ICD-10-CM | POA: Diagnosis not present

## 2017-06-17 DIAGNOSIS — H4052X1 Glaucoma secondary to other eye disorders, left eye, mild stage: Secondary | ICD-10-CM | POA: Diagnosis not present

## 2017-07-13 DIAGNOSIS — D72820 Lymphocytosis (symptomatic): Secondary | ICD-10-CM | POA: Diagnosis not present

## 2017-07-13 DIAGNOSIS — G250 Essential tremor: Secondary | ICD-10-CM | POA: Diagnosis not present

## 2017-07-13 DIAGNOSIS — F4320 Adjustment disorder, unspecified: Secondary | ICD-10-CM | POA: Diagnosis not present

## 2017-07-13 DIAGNOSIS — H409 Unspecified glaucoma: Secondary | ICD-10-CM | POA: Diagnosis not present

## 2017-07-13 DIAGNOSIS — E559 Vitamin D deficiency, unspecified: Secondary | ICD-10-CM | POA: Diagnosis not present

## 2017-07-13 DIAGNOSIS — E782 Mixed hyperlipidemia: Secondary | ICD-10-CM | POA: Diagnosis not present

## 2017-07-13 DIAGNOSIS — M810 Age-related osteoporosis without current pathological fracture: Secondary | ICD-10-CM | POA: Diagnosis not present

## 2017-07-13 DIAGNOSIS — Z0000 Encounter for general adult medical examination without abnormal findings: Secondary | ICD-10-CM | POA: Diagnosis not present

## 2017-07-13 DIAGNOSIS — I10 Essential (primary) hypertension: Secondary | ICD-10-CM | POA: Diagnosis not present

## 2017-10-10 DIAGNOSIS — F4320 Adjustment disorder, unspecified: Secondary | ICD-10-CM | POA: Diagnosis not present

## 2017-10-10 DIAGNOSIS — G250 Essential tremor: Secondary | ICD-10-CM | POA: Diagnosis not present

## 2017-10-10 DIAGNOSIS — E782 Mixed hyperlipidemia: Secondary | ICD-10-CM | POA: Diagnosis not present

## 2017-10-10 DIAGNOSIS — D72820 Lymphocytosis (symptomatic): Secondary | ICD-10-CM | POA: Diagnosis not present

## 2017-10-10 DIAGNOSIS — M810 Age-related osteoporosis without current pathological fracture: Secondary | ICD-10-CM | POA: Diagnosis not present

## 2017-10-10 DIAGNOSIS — I10 Essential (primary) hypertension: Secondary | ICD-10-CM | POA: Diagnosis not present

## 2017-10-10 DIAGNOSIS — E559 Vitamin D deficiency, unspecified: Secondary | ICD-10-CM | POA: Diagnosis not present

## 2017-10-10 DIAGNOSIS — H409 Unspecified glaucoma: Secondary | ICD-10-CM | POA: Diagnosis not present

## 2017-10-10 DIAGNOSIS — Z23 Encounter for immunization: Secondary | ICD-10-CM | POA: Diagnosis not present

## 2017-11-02 DIAGNOSIS — H401132 Primary open-angle glaucoma, bilateral, moderate stage: Secondary | ICD-10-CM | POA: Diagnosis not present

## 2018-02-21 DIAGNOSIS — H401131 Primary open-angle glaucoma, bilateral, mild stage: Secondary | ICD-10-CM | POA: Diagnosis not present

## 2018-03-14 DIAGNOSIS — H401132 Primary open-angle glaucoma, bilateral, moderate stage: Secondary | ICD-10-CM | POA: Diagnosis not present

## 2018-03-14 DIAGNOSIS — H5340 Unspecified visual field defects: Secondary | ICD-10-CM | POA: Diagnosis not present

## 2018-04-10 DIAGNOSIS — E559 Vitamin D deficiency, unspecified: Secondary | ICD-10-CM | POA: Diagnosis not present

## 2018-04-10 DIAGNOSIS — Z0000 Encounter for general adult medical examination without abnormal findings: Secondary | ICD-10-CM | POA: Diagnosis not present

## 2018-04-10 DIAGNOSIS — G250 Essential tremor: Secondary | ICD-10-CM | POA: Diagnosis not present

## 2018-04-10 DIAGNOSIS — M810 Age-related osteoporosis without current pathological fracture: Secondary | ICD-10-CM | POA: Diagnosis not present

## 2018-04-10 DIAGNOSIS — D72820 Lymphocytosis (symptomatic): Secondary | ICD-10-CM | POA: Diagnosis not present

## 2018-04-10 DIAGNOSIS — I10 Essential (primary) hypertension: Secondary | ICD-10-CM | POA: Diagnosis not present

## 2018-04-10 DIAGNOSIS — E782 Mixed hyperlipidemia: Secondary | ICD-10-CM | POA: Diagnosis not present

## 2018-04-10 DIAGNOSIS — F4320 Adjustment disorder, unspecified: Secondary | ICD-10-CM | POA: Diagnosis not present

## 2018-04-10 DIAGNOSIS — H409 Unspecified glaucoma: Secondary | ICD-10-CM | POA: Diagnosis not present

## 2018-04-13 DIAGNOSIS — D72820 Lymphocytosis (symptomatic): Secondary | ICD-10-CM | POA: Diagnosis not present

## 2018-04-13 DIAGNOSIS — M810 Age-related osteoporosis without current pathological fracture: Secondary | ICD-10-CM | POA: Diagnosis not present

## 2018-04-13 DIAGNOSIS — E559 Vitamin D deficiency, unspecified: Secondary | ICD-10-CM | POA: Diagnosis not present

## 2018-04-13 DIAGNOSIS — E782 Mixed hyperlipidemia: Secondary | ICD-10-CM | POA: Diagnosis not present

## 2018-04-13 DIAGNOSIS — F4320 Adjustment disorder, unspecified: Secondary | ICD-10-CM | POA: Diagnosis not present

## 2018-04-13 DIAGNOSIS — Z1389 Encounter for screening for other disorder: Secondary | ICD-10-CM | POA: Diagnosis not present

## 2018-04-13 DIAGNOSIS — H409 Unspecified glaucoma: Secondary | ICD-10-CM | POA: Diagnosis not present

## 2018-04-13 DIAGNOSIS — G250 Essential tremor: Secondary | ICD-10-CM | POA: Diagnosis not present

## 2018-04-13 DIAGNOSIS — Z0000 Encounter for general adult medical examination without abnormal findings: Secondary | ICD-10-CM | POA: Diagnosis not present

## 2018-04-13 DIAGNOSIS — I10 Essential (primary) hypertension: Secondary | ICD-10-CM | POA: Diagnosis not present

## 2018-07-14 DIAGNOSIS — H409 Unspecified glaucoma: Secondary | ICD-10-CM | POA: Diagnosis not present

## 2018-07-14 DIAGNOSIS — D72820 Lymphocytosis (symptomatic): Secondary | ICD-10-CM | POA: Diagnosis not present

## 2018-07-14 DIAGNOSIS — G250 Essential tremor: Secondary | ICD-10-CM | POA: Diagnosis not present

## 2018-07-14 DIAGNOSIS — I10 Essential (primary) hypertension: Secondary | ICD-10-CM | POA: Diagnosis not present

## 2018-07-14 DIAGNOSIS — E782 Mixed hyperlipidemia: Secondary | ICD-10-CM | POA: Diagnosis not present

## 2018-07-14 DIAGNOSIS — E559 Vitamin D deficiency, unspecified: Secondary | ICD-10-CM | POA: Diagnosis not present

## 2018-07-14 DIAGNOSIS — Z0000 Encounter for general adult medical examination without abnormal findings: Secondary | ICD-10-CM | POA: Diagnosis not present

## 2018-07-14 DIAGNOSIS — M810 Age-related osteoporosis without current pathological fracture: Secondary | ICD-10-CM | POA: Diagnosis not present

## 2018-07-18 DIAGNOSIS — H401131 Primary open-angle glaucoma, bilateral, mild stage: Secondary | ICD-10-CM | POA: Diagnosis not present

## 2018-08-09 ENCOUNTER — Encounter (HOSPITAL_COMMUNITY): Payer: Self-pay | Admitting: Emergency Medicine

## 2018-08-09 ENCOUNTER — Emergency Department (HOSPITAL_COMMUNITY): Payer: PPO

## 2018-08-09 ENCOUNTER — Other Ambulatory Visit: Payer: Self-pay

## 2018-08-09 ENCOUNTER — Emergency Department (HOSPITAL_COMMUNITY)
Admission: EM | Admit: 2018-08-09 | Discharge: 2018-08-09 | Disposition: A | Discharge status: Home / Self Care | Payer: PPO | Attending: Emergency Medicine | Admitting: Emergency Medicine

## 2018-08-09 DIAGNOSIS — Y999 Unspecified external cause status: Secondary | ICD-10-CM | POA: Insufficient documentation

## 2018-08-09 DIAGNOSIS — I10 Essential (primary) hypertension: Secondary | ICD-10-CM | POA: Insufficient documentation

## 2018-08-09 DIAGNOSIS — R55 Syncope and collapse: Secondary | ICD-10-CM | POA: Diagnosis not present

## 2018-08-09 DIAGNOSIS — S00502A Unspecified superficial injury of oral cavity, initial encounter: Secondary | ICD-10-CM | POA: Diagnosis not present

## 2018-08-09 DIAGNOSIS — I959 Hypotension, unspecified: Secondary | ICD-10-CM | POA: Diagnosis not present

## 2018-08-09 DIAGNOSIS — W0110XA Fall on same level from slipping, tripping and stumbling with subsequent striking against unspecified object, initial encounter: Secondary | ICD-10-CM | POA: Diagnosis not present

## 2018-08-09 DIAGNOSIS — Z79899 Other long term (current) drug therapy: Secondary | ICD-10-CM | POA: Insufficient documentation

## 2018-08-09 DIAGNOSIS — S01112A Laceration without foreign body of left eyelid and periocular area, initial encounter: Secondary | ICD-10-CM | POA: Insufficient documentation

## 2018-08-09 DIAGNOSIS — S199XXA Unspecified injury of neck, initial encounter: Secondary | ICD-10-CM | POA: Diagnosis not present

## 2018-08-09 DIAGNOSIS — Z23 Encounter for immunization: Secondary | ICD-10-CM | POA: Insufficient documentation

## 2018-08-09 DIAGNOSIS — R110 Nausea: Secondary | ICD-10-CM | POA: Diagnosis not present

## 2018-08-09 DIAGNOSIS — R0902 Hypoxemia: Secondary | ICD-10-CM | POA: Diagnosis not present

## 2018-08-09 DIAGNOSIS — Y939 Activity, unspecified: Secondary | ICD-10-CM | POA: Diagnosis not present

## 2018-08-09 DIAGNOSIS — Y929 Unspecified place or not applicable: Secondary | ICD-10-CM | POA: Diagnosis not present

## 2018-08-09 DIAGNOSIS — S0993XA Unspecified injury of face, initial encounter: Secondary | ICD-10-CM | POA: Diagnosis not present

## 2018-08-09 DIAGNOSIS — S0181XA Laceration without foreign body of other part of head, initial encounter: Secondary | ICD-10-CM | POA: Diagnosis not present

## 2018-08-09 LAB — I-STAT CHEM 8, ED
BUN: 24 mg/dL — ABNORMAL HIGH (ref 8–23)
Calcium, Ion: 0.99 mmol/L — ABNORMAL LOW (ref 1.15–1.40)
Chloride: 110 mmol/L (ref 98–111)
Creatinine, Ser: 0.8 mg/dL (ref 0.44–1.00)
Glucose, Bld: 122 mg/dL — ABNORMAL HIGH (ref 70–99)
HCT: 41 % (ref 36.0–46.0)
Hemoglobin: 13.9 g/dL (ref 12.0–15.0)
Potassium: 3.9 mmol/L (ref 3.5–5.1)
Sodium: 140 mmol/L (ref 135–145)
TCO2: 24 mmol/L (ref 22–32)

## 2018-08-09 LAB — CBC WITH DIFFERENTIAL/PLATELET
Abs Immature Granulocytes: 0 10*3/uL (ref 0.00–0.07)
Basophils Absolute: 0 10*3/uL (ref 0.0–0.1)
Basophils Relative: 0 %
Eosinophils Absolute: 0.5 10*3/uL (ref 0.0–0.5)
Eosinophils Relative: 3 %
HCT: 44.1 % (ref 36.0–46.0)
Hemoglobin: 13.7 g/dL (ref 12.0–15.0)
Lymphocytes Relative: 30 %
Lymphs Abs: 5.4 10*3/uL — ABNORMAL HIGH (ref 0.7–4.0)
MCH: 30.8 pg (ref 26.0–34.0)
MCHC: 31.1 g/dL (ref 30.0–36.0)
MCV: 99.1 fL (ref 80.0–100.0)
Monocytes Absolute: 0.5 10*3/uL (ref 0.1–1.0)
Monocytes Relative: 3 %
Neutro Abs: 11.5 10*3/uL — ABNORMAL HIGH (ref 1.7–7.7)
Neutrophils Relative %: 64 %
Platelets: 183 10*3/uL (ref 150–400)
RBC: 4.45 MIL/uL (ref 3.87–5.11)
RDW: 13.1 % (ref 11.5–15.5)
WBC: 18 10*3/uL — ABNORMAL HIGH (ref 4.0–10.5)
nRBC: 0 /100 WBC
nRBC: 0.5 % — ABNORMAL HIGH (ref 0.0–0.2)

## 2018-08-09 LAB — CBG MONITORING, ED: Glucose-Capillary: 109 mg/dL — ABNORMAL HIGH (ref 70–99)

## 2018-08-09 MED ORDER — LIDOCAINE-EPINEPHRINE (PF) 2 %-1:200000 IJ SOLN
10.0000 mL | Freq: Once | INTRAMUSCULAR | Status: AC
  Administered 2018-08-09: 10 mL
  Filled 2018-08-09: qty 10

## 2018-08-09 MED ORDER — TETANUS-DIPHTH-ACELL PERTUSSIS 5-2.5-18.5 LF-MCG/0.5 IM SUSP: 0.5000 mL | Freq: Once | INTRAMUSCULAR | Status: DC

## 2018-08-09 NOTE — ED Provider Notes (Signed)
Pt under care of Dr Sabra Heck. Please see his note for full details. Briefly pt presents for syncope resulting in left eye brow laceration requiring suture repair.  Physical Exam  BP (!) 129/58 (BP Location: Right Arm)   Pulse (!) 58   Temp 97.6 F (36.4 C) (Oral)   Resp 18   SpO2 98%   Physical Exam  Constitutional: She is oriented to person, place, and time. She appears well-developed and well-nourished.  Non-toxic appearance.  HENT:  Head: Normocephalic.  Right Ear: External ear normal.  Left Ear: External ear normal.  Nose: Nose normal.  Symmetric rise of eyebrows bilaterally. Sensation to light touch in trigeminal nerve distribution intact bilaterally.   Eyes: Conjunctivae and EOM are normal.  Neck: Full passive range of motion without pain.  Cardiovascular: Normal rate.  Pulmonary/Chest: Effort normal. No tachypnea. No respiratory distress.  Musculoskeletal: Normal range of motion.  Neurological: She is alert and oriented to person, place, and time.  Skin: Skin is warm and dry. Capillary refill takes less than 2 seconds.  2 cm laceration through tail of left eye brow with surrounding ecchymosis and tenderness.    Psychiatric: Her behavior is normal. Thought content normal.    ED Course/Procedures   Clinical Course as of Aug 09 1202  Wed Aug 09, 2018  1142 CT scan of the head and cervical spine are unremarkable without any signs of fracture, just chronic arthritis.  I personally looked at these x-rays and I agree with the radiologist.   [BM]    Clinical Course User Index [BM] Noemi Chapel, MD    ..Laceration Repair Date/Time: 08/09/2018 12:05 PM Performed by: Kinnie Feil, PA-C Authorized by: Kinnie Feil, PA-C   Consent:    Consent obtained:  Verbal   Consent given by:  Patient   Risks discussed:  Infection, pain and poor cosmetic result Anesthesia (see MAR for exact dosages):    Anesthesia method:  Local infiltration   Local anesthetic:  Lidocaine 2%  WITH epi Laceration details:    Location:  Face   Face location:  L eyebrow   Length (cm):  2 Repair type:    Repair type:  Intermediate Pre-procedure details:    Preparation:  Patient was prepped and draped in usual sterile fashion Exploration:    Wound exploration: entire depth of wound probed and visualized     Wound extent: no foreign bodies/material noted, no nerve damage noted and no underlying fracture noted     Contaminated: no   Treatment:    Area cleansed with:  Betadine   Amount of cleaning:  Standard   Irrigation solution:  Sterile saline   Irrigation volume:  10 cc    Irrigation method:  Pressure wash   Visualized foreign bodies/material removed: no   Skin repair:    Repair method:  Sutures   Suture size:  5-0   Wound skin closure material used: ethilon.   Suture technique:  Simple interrupted   Number of sutures:  2 Approximation:    Approximation:  Close Post-procedure details:    Dressing:  Antibiotic ointment   Patient tolerance of procedure:  Tolerated well, no immediate complications    MDM   No immediate complications after procedure. Full ROM of facial muscles bilaterally. Sensation intact post procedure.       Kinnie Feil, PA-C 08/09/18 1206    Noemi Chapel, MD 08/10/18 [phone]

## 2018-08-09 NOTE — ED Triage Notes (Signed)
Pt arrives via EMS from home after a syncopal episode that caused her to fall and hit the left side of her head. Laceration noted to L brow and upper lip. Pt alert x4 now.

## 2018-08-09 NOTE — ED Notes (Signed)
Patient left at this time with all belongings. 

## 2018-08-09 NOTE — Discharge Instructions (Signed)
Please have your family doctor take the stitches out within the next 5 days, Please have your family doctor recheck your white blood cell count within the next week, it was slightly elevated today but likely related to passing out If you have chest pain difficulty breathing or feel like your heart is racing or become weak, return to the emergency department immediately. Please rest for the next 2 days drinking plenty of fluids

## 2018-08-09 NOTE — ED Provider Notes (Signed)
[facility] EMERGENCY DEPARTMENT Provider Note   CSN: [id number] Arrival date & time: 08/09/18  1031     History   Chief Complaint Chief Complaint  Patient presents with  . Fall  . Laceration    HPI Selena Branch is a 77 y.o. female.  HPI  The patient is a 77 year old female, she has a known history of anxiety hypertension and hyperlipidemia, she had been constipated and thus last night took Dulcolax.  This morning she woke up nauseated and has had 4 bowel movements which she states were voluminous but not liquid or watery.  She became more lightheaded and finally as she was walking from one room to another had a syncopal event.  She states that she felt like she was getting lightheaded and dizzy prior to falling.  She struck her head but is unsure what she struck it on and suffered a laceration to the left eyebrow.  She had a loss of consciousness and by the time she was coming around the paramedic truck was arriving.  She has minimal headache at this time and denies any weakness numbness changes in vision chest pain coughing shortness of breath palpitations dysuria rashes or swelling.  She has had nothing to eat today.  She has been working on morning to prepare Thanksgiving dinner.  She is not on anticoagulants.  Past Medical History:  Diagnosis Date  . Anxiety   . Glaucoma   . Hyperlipemia   . Hypertension   . Osteoporosis     There are no active problems to display for this patient.   History reviewed. No pertinent surgical history.   OB History   None      Home Medications    Prior to Admission medications   Medication Sig Start Date End Date Taking? Authorizing Provider  amLODipine (NORVASC) 5 MG tablet  11/11/15   [provider]  atorvastatin (LIPITOR) 40 MG tablet Take by mouth.    [provider]  brimonidine (ALPHAGAN) 0.15 % ophthalmic solution  10/13/15   [provider]  Cholecalciferol (VITAMIN D3) 2000  units TABS Take by mouth.    [provider]  dorzolamide-timolol (COSOPT) 22.3-6.8 MG/ML ophthalmic solution  12/10/15   [provider]  fenoprofen (NALFON) 600 MG TABS tablet Take 600 mg by mouth.    [provider]  furosemide (LASIX) 40 MG tablet Take by mouth.    [provider]  ibandronate (BONIVA) 150 MG tablet  12/10/15   [provider]  latanoprost (XALATAN) 0.005 % ophthalmic solution  12/10/15   [provider]  Multiple Vitamin (MULTIVITAMIN) capsule Take 1 capsule by mouth daily.    [provider]  quinapril (ACCUPRIL) 40 MG tablet Take by mouth.    [provider]  sertraline (ZOLOFT) 25 MG tablet  11/28/15   [provider]    Family History Family History  Problem Relation Age of Onset  . Stroke Mother   . Lung cancer Father     Social History Social History   Tobacco Use  . Smoking status: Never Smoker  . Smokeless tobacco: Never Used  Substance Use Topics  . Alcohol use: No    Alcohol/week: 0.0 standard drinks  . Drug use: No     Allergies   Codeine   Review of Systems Review of Systems  All other systems reviewed and are negative.    Physical Exam Updated Vital Signs BP (!) 142/66   Pulse 60  Temp 97.6 F (36.4 C) (Oral)   Resp 19   SpO2 98%   Physical Exam  Constitutional: She appears well-developed and well-nourished. No distress.  HENT:  Head: Normocephalic.  Mouth/Throat: Oropharynx is clear and moist. No oropharyngeal exudate.  2 cm laceration to the L eyebrow, minimal bruising around this  -  No other signs of trauma.  Eyes: Pupils are equal, round, and reactive to light. Conjunctivae and EOM are normal. Right eye exhibits no discharge. Left eye exhibits no discharge. No scleral icterus.  Neck: Normal range of motion. Neck supple. No JVD present. No thyromegaly present.  Cardiovascular: Normal rate, regular rhythm, normal heart sounds and intact distal  pulses. Exam reveals no gallop and no friction rub.  No murmur heard. Pulmonary/Chest: Effort normal and breath sounds normal. No respiratory distress. She has no wheezes. She has no rales.  Abdominal: Soft. Bowel sounds are normal. She exhibits no distension and no mass. There is no tenderness.  Musculoskeletal: Normal range of motion. She exhibits no edema or tenderness.  Lymphadenopathy:    She has no cervical adenopathy.  Neurological: She is alert. Coordination normal.  Speech is clear, cranial nerves III through XII are intact, memory is intact, strength is normal in all 4 extremities including grips, sensation is intact to light touch and pinprick in all 4 extremities. Coordination as tested by finger-nose-finger is normal, no limb ataxia.  Skin: Skin is warm and dry. No rash noted. No erythema.  Psychiatric: She has a normal mood and affect. Her behavior is normal.  Nursing note and vitals reviewed.    ED Treatments / Results  Labs (all labs ordered are listed, but only abnormal results are displayed) Labs Reviewed  CBC WITH DIFFERENTIAL/PLATELET - Abnormal; Notable for the following components:      Result Value   WBC 18.0 (*)    nRBC 0.5 (*)    Neutro Abs 11.5 (*)    Lymphs Abs 5.4 (*)    All other components within normal limits  I-STAT CHEM 8, ED - Abnormal; Notable for the following components:   BUN 24 (*)    Glucose, Bld 122 (*)    Calcium, Ion 0.99 (*)    All other components within normal limits  CBG MONITORING, ED - Abnormal; Notable for the following components:   Glucose-Capillary 109 (*)    All other components within normal limits    EKG EKG Interpretation  Date/Time:  Wednesday August 09 2018 10:41:49 EST Ventricular Rate:  55 PR Interval:    QRS Duration: 87 QT Interval:  434 QTC Calculation: 416 R Axis:   39 Text Interpretation:  Sinus rhythm Since last tracing ectopy no  longer present ST abnormality in the inferior lead 3 similar to 2011  Confirmed by Noemi Chapel [phone]) on 08/09/2018 10:44:42 AM   Radiology Ct Head Wo Contrast  Result Date: 08/09/2018 CLINICAL DATA:  Fall, right forehead laceration. EXAM: CT HEAD WITHOUT CONTRAST CT CERVICAL SPINE WITHOUT CONTRAST TECHNIQUE: Multidetector CT imaging of the head and cervical spine was performed following the standard protocol without intravenous contrast. Multiplanar CT image reconstructions of the cervical spine were also generated. COMPARISON:  None. FINDINGS: CT HEAD FINDINGS Brain: No acute intracranial abnormality. Specifically, no hemorrhage, hydrocephalus, mass lesion, acute infarction, or significant intracranial injury. Vascular: No hyperdense vessel or unexpected calcification. Skull: No acute calvarial abnormality. Sinuses/Orbits: Visualized paranasal sinuses and mastoids clear. Orbital soft tissues unremarkable. Other: Soft tissue swelling and gas in the left lower forehead. CT CERVICAL  SPINE FINDINGS Alignment: No subluxation Skull base and vertebrae: No acute fracture. No primary bone lesion or focal pathologic process. Soft tissues and spinal canal: No prevertebral fluid or swelling. No visible canal hematoma. Disc levels:  Diffuse degenerative disc disease and facet disease. Upper chest: No acute findings Other: Aortic and carotid artery calcifications. IMPRESSION: No acute intracranial abnormality. Diffuse cervical spondylosis.  No acute bony abnormality. Electronically Signed   By: Rolm Baptise M.D.   On: 08/09/2018 11:38   Ct Cervical Spine Wo Contrast  Result Date: 08/09/2018 CLINICAL DATA:  Fall, right forehead laceration. EXAM: CT HEAD WITHOUT CONTRAST CT CERVICAL SPINE WITHOUT CONTRAST TECHNIQUE: Multidetector CT imaging of the head and cervical spine was performed following the standard protocol without intravenous contrast. Multiplanar CT image reconstructions of the cervical spine were also generated. COMPARISON:  None. FINDINGS: CT HEAD FINDINGS Brain: No  acute intracranial abnormality. Specifically, no hemorrhage, hydrocephalus, mass lesion, acute infarction, or significant intracranial injury. Vascular: No hyperdense vessel or unexpected calcification. Skull: No acute calvarial abnormality. Sinuses/Orbits: Visualized paranasal sinuses and mastoids clear. Orbital soft tissues unremarkable. Other: Soft tissue swelling and gas in the left lower forehead. CT CERVICAL SPINE FINDINGS Alignment: No subluxation Skull base and vertebrae: No acute fracture. No primary bone lesion or focal pathologic process. Soft tissues and spinal canal: No prevertebral fluid or swelling. No visible canal hematoma. Disc levels:  Diffuse degenerative disc disease and facet disease. Upper chest: No acute findings Other: Aortic and carotid artery calcifications. IMPRESSION: No acute intracranial abnormality. Diffuse cervical spondylosis.  No acute bony abnormality. Electronically Signed   By: Rolm Baptise M.D.   On: 08/09/2018 11:38    Procedures Procedures (including critical care time)  Medications Ordered in ED Medications  lidocaine-EPINEPHrine (XYLOCAINE W/EPI) 2 %-1:200000 (PF) injection 10 mL (10 mLs Infiltration Given by Other 08/09/18 1130)     Initial Impression / Assessment and Plan / ED Course  I have reviewed the triage vital signs and the nursing notes.  Pertinent labs & imaging results that were available during my care of the patient were reviewed by me and considered in my medical decision making (see chart for details).  Clinical Course as of Aug 10 1142  Wed Aug 09, 2018  1142 CT scan of the head and cervical spine are unremarkable without any signs of fracture, just chronic arthritis.  I personally looked at these x-rays and I agree with the radiologist.   [BM]    Clinical Course User Index [BM] Noemi Chapel, MD    Syncopal episode likely a combination of not eating, multiple bowel movements after laxative and nausea.  She has a laceration which  will need repair, she has had a head injury and given her age I think a CT scan is warranted.  Otherwise the patient does not appear to have any cardiac symptoms and has not had any arrhythmia today as far as she has palpated.  EKG shows normal sinus rhythm, no signs of arrhythmia or ischemia.  I supervised the laceration repair by Ms. Donney Rankins - well approximated with good hemostasis - pt stable for d/c - no other findings on w/u to suggest cause of the syncope - family at the bedside at the time of d/c and are agreeable to the plan.  Final Clinical Impressions(s) / ED Diagnoses   Final diagnoses:  Syncope and collapse  Eyebrow laceration, left, initial encounter  Tooth injury, initial encounter    ED Discharge Orders    None  Noemi Chapel, MD 08/10/18 [phone]

## 2018-08-16 DIAGNOSIS — R55 Syncope and collapse: Secondary | ICD-10-CM | POA: Diagnosis not present

## 2018-08-16 DIAGNOSIS — S01112D Laceration without foreign body of left eyelid and periocular area, subsequent encounter: Secondary | ICD-10-CM | POA: Diagnosis not present

## 2018-10-24 DIAGNOSIS — F4320 Adjustment disorder, unspecified: Secondary | ICD-10-CM | POA: Diagnosis not present

## 2018-10-24 DIAGNOSIS — M810 Age-related osteoporosis without current pathological fracture: Secondary | ICD-10-CM | POA: Diagnosis not present

## 2018-10-24 DIAGNOSIS — D72820 Lymphocytosis (symptomatic): Secondary | ICD-10-CM | POA: Diagnosis not present

## 2018-10-24 DIAGNOSIS — H409 Unspecified glaucoma: Secondary | ICD-10-CM | POA: Diagnosis not present

## 2018-10-24 DIAGNOSIS — E782 Mixed hyperlipidemia: Secondary | ICD-10-CM | POA: Diagnosis not present

## 2018-10-24 DIAGNOSIS — E559 Vitamin D deficiency, unspecified: Secondary | ICD-10-CM | POA: Diagnosis not present

## 2018-10-24 DIAGNOSIS — I10 Essential (primary) hypertension: Secondary | ICD-10-CM | POA: Diagnosis not present

## 2018-10-24 DIAGNOSIS — G250 Essential tremor: Secondary | ICD-10-CM | POA: Diagnosis not present

## 2018-11-06 DIAGNOSIS — N750 Cyst of Bartholin's gland: Secondary | ICD-10-CM | POA: Diagnosis not present

## 2018-11-12 DIAGNOSIS — C9110 Chronic lymphocytic leukemia of B-cell type not having achieved remission: Secondary | ICD-10-CM

## 2018-11-12 HISTORY — DX: Chronic lymphocytic leukemia of B-cell type not having achieved remission: C91.10

## 2018-11-22 DIAGNOSIS — H524 Presbyopia: Secondary | ICD-10-CM | POA: Diagnosis not present

## 2018-11-23 ENCOUNTER — Telehealth: Payer: Self-pay | Admitting: Internal Medicine

## 2018-11-23 NOTE — Telephone Encounter (Signed)
A new hem appt has been scheduled for the pt to see Dr. [place] Field on 3/16 at 1050am. Pt aware to arrive 20 minutes early. Letter mailed.

## 2018-11-27 ENCOUNTER — Telehealth: Payer: Self-pay | Admitting: Internal Medicine

## 2018-11-27 ENCOUNTER — Inpatient Hospital Stay: Payer: PPO | Attending: Internal Medicine | Admitting: Internal Medicine

## 2018-11-27 ENCOUNTER — Other Ambulatory Visit: Payer: Self-pay

## 2018-11-27 ENCOUNTER — Encounter: Payer: Self-pay | Admitting: Internal Medicine

## 2018-11-27 ENCOUNTER — Inpatient Hospital Stay: Payer: PPO

## 2018-11-27 VITALS — BP 175/65 | HR 59 | Temp 98.2°F | Resp 19 | Wt 114.3 lb

## 2018-11-27 DIAGNOSIS — D72820 Lymphocytosis (symptomatic): Secondary | ICD-10-CM | POA: Diagnosis not present

## 2018-11-27 DIAGNOSIS — I10 Essential (primary) hypertension: Secondary | ICD-10-CM | POA: Diagnosis not present

## 2018-11-27 DIAGNOSIS — C9110 Chronic lymphocytic leukemia of B-cell type not having achieved remission: Secondary | ICD-10-CM | POA: Diagnosis not present

## 2018-11-27 DIAGNOSIS — Z79899 Other long term (current) drug therapy: Secondary | ICD-10-CM | POA: Diagnosis not present

## 2018-11-27 DIAGNOSIS — M818 Other osteoporosis without current pathological fracture: Secondary | ICD-10-CM

## 2018-11-27 LAB — CMP (CANCER CENTER ONLY)
ALT: 14 U/L (ref 0–44)
AST: 22 U/L (ref 15–41)
Albumin: 4.6 g/dL (ref 3.5–5.0)
Alkaline Phosphatase: 74 U/L (ref 38–126)
Anion gap: 11 (ref 5–15)
BUN: 16 mg/dL (ref 8–23)
CO2: 24 mmol/L (ref 22–32)
Calcium: 9.8 mg/dL (ref 8.9–10.3)
Chloride: 106 mmol/L (ref 98–111)
Creatinine: 0.87 mg/dL (ref 0.44–1.00)
GFR, Est AFR Am: >60 mL/min (ref >60)
GFR, Estimated: >60 mL/min (ref >60)
Glucose, Bld: 97 mg/dL (ref 70–99)
Potassium: 3.8 mmol/L (ref 3.5–5.1)
Sodium: 141 mmol/L (ref 135–145)
Total Bilirubin: 0.6 mg/dL (ref 0.3–1.2)
Total Protein: 7.5 g/dL (ref 6.5–8.1)

## 2018-11-27 LAB — CBC WITH DIFFERENTIAL (CANCER CENTER ONLY)
Abs Immature Granulocytes: 0 10*3/uL (ref 0.00–0.07)
Basophils Absolute: 0 10*3/uL (ref 0.0–0.1)
Basophils Relative: 0 %
Eosinophils Absolute: 0.2 10*3/uL (ref 0.0–0.5)
Eosinophils Relative: 1 %
HCT: 44.9 % (ref 36.0–46.0)
Hemoglobin: 14.9 g/dL (ref 12.0–15.0)
Lymphocytes Relative: 87 %
Lymphs Abs: 21 10*3/uL — ABNORMAL HIGH (ref 0.7–4.0)
MCH: 31.8 pg (ref 26.0–34.0)
MCHC: 33.2 g/dL (ref 30.0–36.0)
MCV: 95.9 fL (ref 80.0–100.0)
Monocytes Absolute: 0.2 10*3/uL (ref 0.1–1.0)
Monocytes Relative: 1 %
Neutro Abs: 2.7 10*3/uL (ref 1.7–17.7)
Neutrophils Relative %: 11 %
Platelet Count: 182 10*3/uL (ref 150–400)
RBC: 4.68 MIL/uL (ref 3.87–5.11)
RDW: 12.9 % (ref 11.5–15.5)
WBC Count: 24.1 10*3/uL — ABNORMAL HIGH (ref 4.0–10.5)
nRBC: 0 % (ref 0.0–0.2)

## 2018-11-27 LAB — LACTATE DEHYDROGENASE: LDH: 260 U/L — ABNORMAL HIGH (ref 98–192)

## 2018-11-27 LAB — C-REACTIVE PROTEIN: CRP: <0.8 mg/dL (ref <1.0)

## 2018-11-27 LAB — SEDIMENTATION RATE: Sed Rate: 4 mm/hr (ref 0–22)

## 2018-11-27 NOTE — Telephone Encounter (Signed)
Scheduled appt per 03/16 los. ° °Printed calendar and avs °

## 2018-11-27 NOTE — Progress Notes (Signed)
Referring Physician:  [name], MD  Diagnosis Lymphocytosis - Plan: CBC with Differential ([facility] Only), CMP ([facility] only), Lactate dehydrogenase (LDH), Flow Cytometry, Sedimentation rate, Hepatitis B surface antibody, Hepatitis B surface antigen, Hepatitis B core antibody, total, Hepatitis C antibody, C-reactive protein, Rheumatoid factor  Staging Cancer Staging No matching staging information was found for the patient.  Assessment and Plan:  1.  Leucocytosis.  78 year old female referred for consultation due to Leucocytosis.  Labs done March 23, 2016 showed a white count of 9.3 hemoglobin 14.5 platelets 203,000.  She had 52% lymphocytes at that time.  Labs done 04/05/2017 showed a white count of 14 hemoglobin 14.7 platelets 210,000.  She had 60% lymphocytes at that time.  Labs done 10/10/2017 showed a white count of 13 hemoglobin 15 platelets 212,000.  She had 75% lymphocytes at that time.  Labs done July 14, 2018 showed a white count of 16 hemoglobin 14.1 platelets 190,000.  She had 82% lymphocytes at that time.  Recent labs done 10/24/2018 showed a white count of 18 hemoglobin 14.7 platelets 190,000.  She has 81% lymphocytes.  Patient denies fever, chills, night sweats, weight loss, and has noted no adenopathy.  She denies any recent infections.  She denies any new medications.  She denies any family history of leukemias or lymphomas.  Her father had lung cancer but was a heavy smoker.  The patient is a non-smoker.  She is seen today for consultation due to progressive leukocytosis and lymphocytosis.  Labs done today reviewed and showed WBC 24.1 HB 14.9 plts 182,000.  Smear review shows 87% lymphocytes with smudge cells noted.  Chemistries WNL with K+ 3.8 Cr 0.87 and normal LFTs.  LDH 260.  Awaiting results of flow cytometry.  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  She has normal HB and plt count.  Pt asymptomatic.  She will RTC in 2 weeks to go over  results.  All questions answered and pt expressed understanding of information presented.    2.  Hypertension.  BP is 175/65.  Follow-up with PCP.    3.  Health maintenance.  Colonoscopy screening and mammograms as recommended.    40 minutes spent with more than 50% spent in review of records, counseling and coordination of care.    HPI:  78 year old female referred for consultation due to Leucocytosis.  Labs done March 23, 2016 showed a white count of 9.3 hemoglobin 14.5 platelets 203,000.  She had 52% lymphocytes at that time.  Labs done 04/05/2017 showed a white count of 14 hemoglobin 14.7 platelets 210,000.  She had 60% lymphocytes at that time.  Labs done 10/10/2017 showed a white count of 13 hemoglobin 15 platelets 212,000.  She had 75% lymphocytes at that time.  Labs done July 14, 2018 showed a white count of 16 hemoglobin 14.1 platelets 190,000.  She had 82% lymphocytes at that time.  Recent labs done 10/24/2018 showed a white count of 18 hemoglobin 14.7 platelets 190,000.  She has 81% lymphocytes.  Patient denies fever, chills, night sweats, weight loss, and has noted no adenopathy.  She denies any recent infections.  She denies any new medications.  She denies any family history of leukemias or lymphomas.  Her father had lung cancer but was a heavy smoker.  The patient is a non-smoker.  She is seen today for consultation due to progressive leukocytosis and lymphocytosis  Problem List There are no active problems to display for this  patient.   Past Medical History Past Medical History:  Diagnosis Date  . Anxiety   . Glaucoma   . Hyperlipemia   . Hypertension   . Osteoporosis     Past Surgical History History reviewed. No pertinent surgical history.  Family History Family History  Problem Relation Age of Onset  . Stroke Mother   . Lung cancer Father      Social History  reports that she has never smoked. She has never used smokeless tobacco. She reports that she does not  drink alcohol or use drugs.  Medications  Current Outpatient Medications:  .  amLODipine (NORVASC) 5 MG tablet, , Disp: , Rfl:  .  atorvastatin (LIPITOR) 40 MG tablet, Take by mouth., Disp: , Rfl:  .  brimonidine (ALPHAGAN) 0.15 % ophthalmic solution, , Disp: , Rfl:  .  Cholecalciferol (VITAMIN D3) 2000 units TABS, Take by mouth., Disp: , Rfl:  .  dorzolamide-timolol (COSOPT) 22.3-6.8 MG/ML ophthalmic solution, , Disp: , Rfl:  .  fenoprofen (NALFON) 600 MG TABS tablet, Take 600 mg by mouth., Disp: , Rfl:  .  furosemide (LASIX) 40 MG tablet, Take by mouth., Disp: , Rfl:  .  ibandronate (BONIVA) 150 MG tablet, , Disp: , Rfl:  .  latanoprost (XALATAN) 0.005 % ophthalmic solution, , Disp: , Rfl:  .  Multiple Vitamin (MULTIVITAMIN) capsule, Take 1 capsule by mouth daily., Disp: , Rfl:  .  quinapril (ACCUPRIL) 40 MG tablet, Take by mouth., Disp: , Rfl:  .  sertraline (ZOLOFT) 25 MG tablet, , Disp: , Rfl:   Allergies Codeine  Review of Systems Review of Systems - Oncology ROS negative   Physical Exam  Vitals Wt Readings from Last 3 Encounters:  11/27/18 114 lb 4.8 oz (51.8 kg)  04/07/16 115 lb (52.2 kg)  01/05/16 114 lb (51.7 kg)   Temp Readings from Last 3 Encounters:  11/27/18 98.2 F (36.8 C) (Oral)  08/09/18 97.6 F (36.4 C) (Oral)   BP Readings from Last 3 Encounters:  11/27/18 (!) 175/65  08/09/18 (!) 142/66  04/07/16 (!) 148/72   Pulse Readings from Last 3 Encounters:  11/27/18 (!) 59  08/09/18 60  04/07/16 72   Constitutional: Well-developed, well-nourished, and in no distress.   HENT: Head: Normocephalic and atraumatic.  Mouth/Throat: No oropharyngeal exudate. Mucosa moist. Eyes: Pupils are equal, round, and reactive to light. Conjunctivae are normal. No scleral icterus.  Neck: Normal range of motion. Neck supple. No JVD present.  Cardiovascular: Normal rate, regular rhythm and normal heart sounds.  Exam reveals no gallop and no friction rub.   No murmur  heard. Pulmonary/Chest: Effort normal and breath sounds normal. No respiratory distress. No wheezes.No rales.  Abdominal: Soft. Bowel sounds are normal. No distension. There is no tenderness. There is no guarding.  Musculoskeletal: No edema or tenderness.  Lymphadenopathy: No cervical,axillary or supraclavicular adenopathy.  Neurological: Alert and oriented to person, place, and time. No cranial nerve deficit.  Skin: Skin is warm and dry. No rash noted. No erythema. No pallor.  Psychiatric: Affect and judgment normal.   Labs Appointment on 11/27/2018  Component Date Value Ref Range Status  . WBC Count 11/27/2018 24.1* 4.0 - 10.5 K/uL Final  . RBC 11/27/2018 4.68  3.87 - 5.11 MIL/uL Final  . Hemoglobin 11/27/2018 14.9  12.0 - 15.0 g/dL Final  . HCT 11/27/2018 44.9  36.0 - 46.0 % Final  . MCV 11/27/2018 95.9  80.0 - 100.0 fL Final  . MCH 11/27/2018 31.8  26.0 - 34.0 pg Final  . MCHC 11/27/2018 33.2  30.0 - 36.0 g/dL Final  . RDW 11/27/2018 12.9  11.5 - 15.5 % Final  . Platelet Count 11/27/2018 182  150 - 400 K/uL Final  . nRBC 11/27/2018 0.0  0.0 - 0.2 % Final  . Neutrophils Relative % 11/27/2018 11  % Final  . Neutro Abs 11/27/2018 2.7  1.7 - 17.7 K/uL Final  . Lymphocytes Relative 11/27/2018 87  % Final  . Lymphs Abs 11/27/2018 21.0* 0.7 - 4.0 K/uL Final  . Monocytes Relative 11/27/2018 1  % Final  . Monocytes Absolute 11/27/2018 0.2  0.1 - 1.0 K/uL Final  . Eosinophils Relative 11/27/2018 1  % Final  . Eosinophils Absolute 11/27/2018 0.2  0.0 - 0.5 K/uL Final  . Basophils Relative 11/27/2018 0  % Final  . Basophils Absolute 11/27/2018 0.0  0.0 - 0.1 K/uL Final  . WBC Morphology 11/27/2018 VARIANT LYMPHS   Final  . Abs Immature Granulocytes 11/27/2018 0.00  0.00 - 0.07 K/uL Final  . Smudge Cells 11/27/2018 PRESENT   Final   Performed at [facility], [place] [address], [place], [place] [address]  . Sodium 11/27/2018 141  135 - 145 mmol/L Final  . Potassium  11/27/2018 3.8  3.5 - 5.1 mmol/L Final  . Chloride 11/27/2018 106  98 - 111 mmol/L Final  . CO2 11/27/2018 24  22 - 32 mmol/L Final  . Glucose, Bld 11/27/2018 97  70 - 99 mg/dL Final  . BUN 11/27/2018 16  8 - 23 mg/dL Final  . Creatinine 11/27/2018 0.87  0.44 - 1.00 mg/dL Final  . Calcium 11/27/2018 9.8  8.9 - 10.3 mg/dL Final  . Total Protein 11/27/2018 7.5  6.5 - 8.1 g/dL Final  . Albumin 11/27/2018 4.6  3.5 - 5.0 g/dL Final  . AST 11/27/2018 22  15 - 41 U/L Final  . ALT 11/27/2018 14  0 - 44 U/L Final  . Alkaline Phosphatase 11/27/2018 74  38 - 126 U/L Final  . Total Bilirubin 11/27/2018 0.6  0.3 - 1.2 mg/dL Final  . GFR, Est Non Af Am 11/27/2018 >60  >60 mL/min Final  . GFR, Est AFR Am 11/27/2018 >60  >60 mL/min Final  . Anion gap 11/27/2018 11  5 - 15 Final   Performed at [facility], [place] [address], [place], [place] [address]  . LDH 11/27/2018 260* 98 - 192 U/L Final   Performed at [facility], [place] [address], [place], [place] [address]  . CRP 11/27/2018 <0.8  <1.0 mg/dL Final   Performed at [facility] [address], [place], [place] [address]     Pathology Orders Placed This Encounter  Procedures  . CBC with Differential ([facility] Only)    Standing Status:   Future    Number of Occurrences:   1    Standing Expiration Date:   11/27/2019  . CMP ([facility] only)    Standing Status:   Future    Number of Occurrences:   1    Standing Expiration Date:   11/27/2019  . Lactate dehydrogenase (LDH)    Standing Status:   Future    Number of Occurrences:   1    Standing Expiration Date:   11/27/2019  . Flow Cytometry    Standing Status:   Future    Number of Occurrences:   1    Standing Expiration Date:  11/27/2019  . Sedimentation rate    Standing Status:   Future    Number of Occurrences:   1    Standing Expiration Date:   11/27/2019  . Hepatitis B surface antibody    Standing Status:   Future     Number of Occurrences:   1    Standing Expiration Date:   11/27/2019  . Hepatitis B surface antigen    Standing Status:   Future    Number of Occurrences:   1    Standing Expiration Date:   11/27/2019  . Hepatitis B core antibody, total    Standing Status:   Future    Number of Occurrences:   1    Standing Expiration Date:   11/27/2019  . Hepatitis C antibody    Standing Status:   Future    Number of Occurrences:   1    Standing Expiration Date:   11/27/2019  . C-reactive protein    Standing Status:   Future    Number of Occurrences:   1    Standing Expiration Date:   11/27/2019  . Rheumatoid factor    Standing Status:   Future    Number of Occurrences:   1    Standing Expiration Date:   11/27/2019       Zoila Shutter MD

## 2018-11-28 LAB — HEPATITIS B CORE ANTIBODY, TOTAL: Hep B Core Total Ab: NEGATIVE

## 2018-11-28 LAB — HEPATITIS B SURFACE ANTIGEN: Hepatitis B Surface Ag: NEGATIVE

## 2018-11-28 LAB — HEPATITIS C ANTIBODY: HCV Ab: <0.1 s/co ratio (ref 0.0–0.9)

## 2018-11-28 LAB — HEPATITIS B SURFACE ANTIBODY,QUALITATIVE: Hep B S Ab: NONREACTIVE

## 2018-11-28 LAB — RHEUMATOID FACTOR: Rhuematoid fact SerPl-aCnc: <10 IU/mL (ref 0.0–13.9)

## 2018-11-30 LAB — FLOW CYTOMETRY

## 2018-12-01 ENCOUNTER — Telehealth: Payer: Self-pay | Admitting: Internal Medicine

## 2018-12-01 NOTE — Telephone Encounter (Signed)
Per 3/20 schedule message moved 3/30 f/u to 3/24. Confirmed with patient.

## 2018-12-05 ENCOUNTER — Telehealth: Payer: Self-pay | Admitting: Internal Medicine

## 2018-12-05 ENCOUNTER — Inpatient Hospital Stay (HOSPITAL_BASED_OUTPATIENT_CLINIC_OR_DEPARTMENT_OTHER): Payer: PPO | Admitting: Internal Medicine

## 2018-12-05 ENCOUNTER — Other Ambulatory Visit: Payer: Self-pay

## 2018-12-05 VITALS — BP 180/66 | HR 65 | Temp 98.3°F | Resp 17 | Wt 114.2 lb

## 2018-12-05 DIAGNOSIS — I10 Essential (primary) hypertension: Secondary | ICD-10-CM

## 2018-12-05 DIAGNOSIS — C9110 Chronic lymphocytic leukemia of B-cell type not having achieved remission: Secondary | ICD-10-CM

## 2018-12-05 DIAGNOSIS — M818 Other osteoporosis without current pathological fracture: Secondary | ICD-10-CM | POA: Diagnosis not present

## 2018-12-05 NOTE — Telephone Encounter (Signed)
Scheduled appt per 3/24 los. ° °Printed calendar and avs. °

## 2018-12-05 NOTE — Progress Notes (Signed)
Diagnosis CLL (chronic lymphocytic leukemia) ([place]) - Plan: CBC with Differential ([place] Only), CMP ([place] only), Lactate dehydrogenase (LDH), FISH, CLL Prognostic Panel  Staging Cancer Staging No matching staging information was found for the patient.  Assessment and Plan:   1. Stage 0 CLL.   78 year old female initially seen for consultation due to Leucocytosis.  Labs done March 23, 2016 showed a white count of 9.3 hemoglobin 14.5 platelets 203,000.  She had 52% lymphocytes at that time.  Labs done 04/05/2017 showed a white count of 14 hemoglobin 14.7 platelets 210,000.  She had 60% lymphocytes at that time.  Labs done 10/10/2017 showed a white count of 13 hemoglobin 15 platelets 212,000.  She had 75% lymphocytes at that time.  Labs done July 14, 2018 showed a white count of 16 hemoglobin 14.1 platelets 190,000.  She had 82% lymphocytes at that time.  Recent labs done 10/24/2018 showed a white count of 18 hemoglobin 14.7 platelets 190,000.  She has 81% lymphocytes.  Patient denies fever, chills, night sweats, weight loss, and has noted no adenopathy.  She denies any recent infections.  She denies any new medications.  She denies any family history of leukemias or lymphomas.  Her father had lung cancer but was a heavy smoker.  The patient is a non-smoker.  She is seen today for consultation due to progressive leukocytosis and lymphocytosis.  Labs done 11/27/2018 reviewed and showed WBC 24.1 HB 14.9 plts 182,000.  Smear review shows 87% lymphocytes with smudge cells noted.  Chemistries WNL with K+ 3.8 Cr 0.87 and normal LFTs.  LDH 260.  Flow cytometry is consistent with CLL.  Pt had history of lymphocytosis dating back to 2017.  She has normal HB and plt count.  Pt asymptomatic.  Natural history of CLL discussed with pt and based on stage I discussed with her due to showing no evidence of anemia or thrombocytopenia she is recommended for observation.  She will RTC in 3 months with repeat labs  and will check CLL prognostic panel at that time.  She should notify the office if any change in symptoms prior to her next visit.  Pt was provided written information regarding  All questions answered and pt expressed understanding of information presented.    2.  Hypertension.  BP is 180/66.   Follow-up with PCP.    3.  Health maintenance.  Colonoscopy screening and mammograms as recommended.    25  minutes spent with more than 50% spent in review of records, counseling and coordination of care.    Interval History:  Historical data obtained from note dated 11/27/2018.  78 year old female initially seen for consultation due to Leucocytosis.  Labs done March 23, 2016 showed a white count of 9.3 hemoglobin 14.5 platelets 203,000.  She had 52% lymphocytes at that time.  Labs done 04/05/2017 showed a white count of 14 hemoglobin 14.7 platelets 210,000.  She had 60% lymphocytes at that time.  Labs done 10/10/2017 showed a white count of 13 hemoglobin 15 platelets 212,000.  She had 75% lymphocytes at that time.  Labs done July 14, 2018 showed a white count of 16 hemoglobin 14.1 platelets 190,000.  She had 82% lymphocytes at that time.  Recent labs done 10/24/2018 showed a white count of 18 hemoglobin 14.7 platelets 190,000.  She has 81% lymphocytes.  Patient denies fever, chills, night sweats, weight loss, and has noted no adenopathy.  She denies any recent infections.  She denies any new  medications.  She denies any family history of leukemias or lymphomas.  Her father had lung cancer but was a heavy smoker.  The patient is a non-smoker.    Current Status:  Pt is seen today for follow-up.  She is here to go over labs.  She denies fevers, chills, night sweats, weight loss, abdominal pain and has noted no adenopathy.    Peripheral Blood Flow Cytometry done 11/27/2018 - MONOCLONAL B-CELL POPULATION IDENTIFIED. - SEE NOTE. Diagnosis Comment: The overall findings favor chronic lymphocytic leukemia.  Problem  List There are no active problems to display for this patient.   Past Medical History Past Medical History:  Diagnosis Date  . Anxiety   . Glaucoma   . Hyperlipemia   . Hypertension   . Osteoporosis     Past Surgical History No past surgical history on file.  Family History Family History  Problem Relation Age of Onset  . Stroke Mother   . Lung cancer Father      Social History  reports that she has never smoked. She has never used smokeless tobacco. She reports that she does not drink alcohol or use drugs.  Medications  Current Outpatient Medications:  .  amLODipine (NORVASC) 5 MG tablet, , Disp: , Rfl:  .  atorvastatin (LIPITOR) 40 MG tablet, Take by mouth., Disp: , Rfl:  .  brimonidine (ALPHAGAN) 0.15 % ophthalmic solution, , Disp: , Rfl:  .  Cholecalciferol (VITAMIN D3) 2000 units TABS, Take by mouth., Disp: , Rfl:  .  dorzolamide-timolol (COSOPT) 22.3-6.8 MG/ML ophthalmic solution, , Disp: , Rfl:  .  fenoprofen (NALFON) 600 MG TABS tablet, Take 600 mg by mouth., Disp: , Rfl:  .  furosemide (LASIX) 40 MG tablet, Take by mouth., Disp: , Rfl:  .  ibandronate (BONIVA) 150 MG tablet, , Disp: , Rfl:  .  latanoprost (XALATAN) 0.005 % ophthalmic solution, , Disp: , Rfl:  .  Multiple Vitamin (MULTIVITAMIN) capsule, Take 1 capsule by mouth daily., Disp: , Rfl:  .  quinapril (ACCUPRIL) 40 MG tablet, Take by mouth., Disp: , Rfl:  .  sertraline (ZOLOFT) 25 MG tablet, , Disp: , Rfl:   Allergies Codeine  Review of Systems Review of Systems - Oncology ROS negative   Physical Exam  Vitals Wt Readings from Last 3 Encounters:  12/05/18 114 lb 3.2 oz (51.8 kg)  11/27/18 114 lb 4.8 oz (51.8 kg)  04/07/16 115 lb (52.2 kg)   Temp Readings from Last 3 Encounters:  12/05/18 98.3 F (36.8 C) (Oral)  11/27/18 98.2 F (36.8 C) (Oral)  08/09/18 97.6 F (36.4 C) (Oral)   BP Readings from Last 3 Encounters:  12/05/18 (!) 180/66  11/27/18 (!) 175/65  08/09/18 (!) 142/66    Pulse Readings from Last 3 Encounters:  12/05/18 65  11/27/18 (!) 59  08/09/18 60   Constitutional: Well-developed, well-nourished, and in no distress.   HENT: Head: Normocephalic and atraumatic.  Mouth/Throat: No oropharyngeal exudate. Mucosa moist. Eyes: Pupils are equal, round, and reactive to light. Conjunctivae are normal. No scleral icterus.  Neck: Normal range of motion. Neck supple. No JVD present.  Cardiovascular: Normal rate, regular rhythm and normal heart sounds.  Exam reveals no gallop and no friction rub.   No murmur heard. Pulmonary/Chest: Effort normal and breath sounds normal. No respiratory distress. No wheezes.No rales.  Abdominal: Soft. Bowel sounds are normal. No distension. There is no tenderness. There is no guarding.  Musculoskeletal: No edema or tenderness.  Lymphadenopathy: No cervical,  axillary or supraclavicular adenopathy.  Neurological: Alert and oriented to person, place, and time. No cranial nerve deficit.  Skin: Skin is warm and dry. No rash noted. No erythema. No pallor.  Psychiatric: Affect and judgment normal.   Labs No visits with results within 3 Day(s) from this visit.  Latest known visit with results is:  Clinical Support on 11/27/2018  Component Date Value Ref Range Status  . WBC Count 11/27/2018 24.1* 4.0 - 10.5 K/uL Final  . RBC 11/27/2018 4.68  3.87 - 5.11 MIL/uL Final  . Hemoglobin 11/27/2018 14.9  12.0 - 15.0 g/dL Final  . HCT 11/27/2018 44.9  36.0 - 46.0 % Final  . MCV 11/27/2018 95.9  80.0 - 100.0 fL Final  . MCH 11/27/2018 31.8  26.0 - 34.0 pg Final  . MCHC 11/27/2018 33.2  30.0 - 36.0 g/dL Final  . RDW 11/27/2018 12.9  11.5 - 15.5 % Final  . Platelet Count 11/27/2018 182  150 - 400 K/uL Final  . nRBC 11/27/2018 0.0  0.0 - 0.2 % Final  . Neutrophils Relative % 11/27/2018 11  % Final  . Neutro Abs 11/27/2018 2.7  1.7 - 17.7 K/uL Final  . Lymphocytes Relative 11/27/2018 87  % Final  . Lymphs Abs 11/27/2018 21.0* 0.7 - 4.0 K/uL  Final  . Monocytes Relative 11/27/2018 1  % Final  . Monocytes Absolute 11/27/2018 0.2  0.1 - 1.0 K/uL Final  . Eosinophils Relative 11/27/2018 1  % Final  . Eosinophils Absolute 11/27/2018 0.2  0.0 - 0.5 K/uL Final  . Basophils Relative 11/27/2018 0  % Final  . Basophils Absolute 11/27/2018 0.0  0.0 - 0.1 K/uL Final  . WBC Morphology 11/27/2018 VARIANT LYMPHS   Final  . Abs Immature Granulocytes 11/27/2018 0.00  0.00 - 0.07 K/uL Final  . Smudge Cells 11/27/2018 PRESENT   Final   Performed at [facility], [place] [address], [place], [place] [address]  . Sodium 11/27/2018 141  135 - 145 mmol/L Final  . Potassium 11/27/2018 3.8  3.5 - 5.1 mmol/L Final  . Chloride 11/27/2018 106  98 - 111 mmol/L Final  . CO2 11/27/2018 24  22 - 32 mmol/L Final  . Glucose, Bld 11/27/2018 97  70 - 99 mg/dL Final  . BUN 11/27/2018 16  8 - 23 mg/dL Final  . Creatinine 11/27/2018 0.87  0.44 - 1.00 mg/dL Final  . Calcium 11/27/2018 9.8  8.9 - 10.3 mg/dL Final  . Total Protein 11/27/2018 7.5  6.5 - 8.1 g/dL Final  . Albumin 11/27/2018 4.6  3.5 - 5.0 g/dL Final  . AST 11/27/2018 22  15 - 41 U/L Final  . ALT 11/27/2018 14  0 - 44 U/L Final  . Alkaline Phosphatase 11/27/2018 74  38 - 126 U/L Final  . Total Bilirubin 11/27/2018 0.6  0.3 - 1.2 mg/dL Final  . GFR, Est Non Af Am 11/27/2018 >60  >60 mL/min Final  . GFR, Est AFR Am 11/27/2018 >60  >60 mL/min Final  . Anion gap 11/27/2018 11  5 - 15 Final   Performed at [facility], [place] [address], [place], [place] [address]  . LDH 11/27/2018 260* 98 - 192 U/L Final   Performed at [facility], [place] [address] [place], [place] [address]  . Flow Cytometry 11/27/2018 See Scanned report in [place]   Final   Performed at [facility], [address] [place],  [place] [address]  . Sed Rate 11/27/2018 4  0 - 22 mm/hr Final   Performed at [facility],  [place] [address] [place], [place] [address]  . Hep B S Ab 11/27/2018 Non Reactive   Final   Comment: (NOTE)              Non Reactive: Inconsistent with immunity,                            less than 10 mIU/mL              Reactive:     Consistent with immunity,                            greater than 9.9 mIU/mL Performed At: [facility] [place], [place] [id number] Rush Farmer MD RK:[id number]   . Hepatitis B Surface Ag 11/27/2018 Negative  Negative Final   Comment: (NOTE) Performed At: [facility] [place], [place] [id number] Rush Farmer MD VP:[id number]   . Hep B Core Total Ab 11/27/2018 Negative  Negative Final   Comment: (NOTE) Performed At: [facility] [place], [place] [id number] Rush Farmer MD XF:[id number]   . HCV Ab 11/27/2018 <0.1  0.0 - 0.9 s/co ratio Final   Comment: (NOTE)                                  Negative:     < 0.8                             Indeterminate: 0.8 - 0.9                                  Positive:     > 0.9 The CDC recommends that a positive HCV antibody result be followed up with a HCV Nucleic Acid Amplification test ([id number]). Performed At: [facility] [place], [place] [id number] Rush Farmer MD [id number]   . CRP 11/27/2018 <0.8  <1.0 mg/dL Final   Performed at [place] [address], [place], [place] [address]  . Rhuematoid fact SerPl-aCnc 11/27/2018 <10.0  0.0 - 13.9 IU/mL Final   Comment: (NOTE) Performed At: [facility] [place], [place] [id number] Rush Farmer MD YP:[id number]      Pathology Orders Placed This Encounter  Procedures  . CBC with Differential ([facility] Only)    Standing Status:   Future    Standing Expiration Date:   12/05/2019  . CMP ([place] only)    Standing Status:   Future    Standing Expiration Date:   12/05/2019  . Lactate dehydrogenase  (LDH)    Standing Status:   Future    Standing Expiration Date:   12/05/2019  . FISH, CLL Prognostic Panel    Standing Status:   Future    Standing Expiration Date:   12/05/2019       Zoila Shutter MD

## 2018-12-11 ENCOUNTER — Ambulatory Visit: Payer: PPO | Admitting: Internal Medicine

## 2019-01-15 ENCOUNTER — Telehealth: Payer: Self-pay | Admitting: Internal Medicine

## 2019-01-15 NOTE — Telephone Encounter (Signed)
[facility] out of office 6/23 moved f/u to 6/24. Other appointments remain the same. Confirmed with patient.

## 2019-02-08 DIAGNOSIS — H409 Unspecified glaucoma: Secondary | ICD-10-CM | POA: Diagnosis not present

## 2019-02-08 DIAGNOSIS — M810 Age-related osteoporosis without current pathological fracture: Secondary | ICD-10-CM | POA: Diagnosis not present

## 2019-02-08 DIAGNOSIS — I10 Essential (primary) hypertension: Secondary | ICD-10-CM | POA: Diagnosis not present

## 2019-02-08 DIAGNOSIS — E782 Mixed hyperlipidemia: Secondary | ICD-10-CM | POA: Diagnosis not present

## 2019-02-15 DIAGNOSIS — I10 Essential (primary) hypertension: Secondary | ICD-10-CM | POA: Diagnosis not present

## 2019-02-15 DIAGNOSIS — E782 Mixed hyperlipidemia: Secondary | ICD-10-CM | POA: Diagnosis not present

## 2019-02-15 DIAGNOSIS — H409 Unspecified glaucoma: Secondary | ICD-10-CM | POA: Diagnosis not present

## 2019-02-15 DIAGNOSIS — M810 Age-related osteoporosis without current pathological fracture: Secondary | ICD-10-CM | POA: Diagnosis not present

## 2019-02-27 ENCOUNTER — Other Ambulatory Visit: Payer: Self-pay

## 2019-02-27 ENCOUNTER — Inpatient Hospital Stay: Payer: PPO | Attending: Internal Medicine

## 2019-02-27 DIAGNOSIS — Z79899 Other long term (current) drug therapy: Secondary | ICD-10-CM | POA: Insufficient documentation

## 2019-02-27 DIAGNOSIS — Z801 Family history of malignant neoplasm of trachea, bronchus and lung: Secondary | ICD-10-CM | POA: Insufficient documentation

## 2019-02-27 DIAGNOSIS — C9110 Chronic lymphocytic leukemia of B-cell type not having achieved remission: Secondary | ICD-10-CM | POA: Diagnosis not present

## 2019-02-27 DIAGNOSIS — I10 Essential (primary) hypertension: Secondary | ICD-10-CM | POA: Diagnosis not present

## 2019-02-27 DIAGNOSIS — Z885 Allergy status to narcotic agent status: Secondary | ICD-10-CM | POA: Diagnosis not present

## 2019-02-27 DIAGNOSIS — Z823 Family history of stroke: Secondary | ICD-10-CM | POA: Insufficient documentation

## 2019-02-27 LAB — CBC WITH DIFFERENTIAL (CANCER CENTER ONLY)
Abs Immature Granulocytes: 0 10*3/uL (ref 0.00–0.07)
Basophils Absolute: 0 10*3/uL (ref 0.0–0.1)
Basophils Relative: 0 %
Eosinophils Absolute: 0.6 10*3/uL — ABNORMAL HIGH (ref 0.0–0.5)
Eosinophils Relative: 2 %
HCT: 47.2 % — ABNORMAL HIGH (ref 36.0–46.0)
Hemoglobin: 15.4 g/dL — ABNORMAL HIGH (ref 12.0–15.0)
Lymphocytes Relative: 88 %
Lymphs Abs: 25.3 10*3/uL — ABNORMAL HIGH (ref 0.7–4.0)
MCH: 32.1 pg (ref 26.0–34.0)
MCHC: 32.6 g/dL (ref 30.0–36.0)
MCV: 98.3 fL (ref 80.0–100.0)
Monocytes Absolute: 0 10*3/uL — ABNORMAL LOW (ref 0.1–1.0)
Monocytes Relative: 0 %
Neutro Abs: 2.9 10*3/uL (ref 1.7–17.7)
Neutrophils Relative %: 10 %
Platelet Count: 187 10*3/uL (ref 150–400)
RBC: 4.8 MIL/uL (ref 3.87–5.11)
RDW: 12.9 % (ref 11.5–15.5)
WBC Count: 28.7 10*3/uL — ABNORMAL HIGH (ref 4.0–10.5)
nRBC: 0 % (ref 0.0–0.2)

## 2019-02-27 LAB — CMP (CANCER CENTER ONLY)
ALT: 12 U/L (ref 0–44)
AST: 24 U/L (ref 15–41)
Albumin: 4.8 g/dL (ref 3.5–5.0)
Alkaline Phosphatase: 66 U/L (ref 38–126)
Anion gap: 12 (ref 5–15)
BUN: 16 mg/dL (ref 8–23)
CO2: 27 mmol/L (ref 22–32)
Calcium: 10.2 mg/dL (ref 8.9–10.3)
Chloride: 103 mmol/L (ref 98–111)
Creatinine: 1 mg/dL (ref 0.44–1.00)
GFR, Est AFR Am: >60 mL/min (ref >60)
GFR, Estimated: 54 mL/min — ABNORMAL LOW (ref >60)
Glucose, Bld: 88 mg/dL (ref 70–99)
Potassium: 4 mmol/L (ref 3.5–5.1)
Sodium: 142 mmol/L (ref 135–145)
Total Bilirubin: 0.6 mg/dL (ref 0.3–1.2)
Total Protein: 7.5 g/dL (ref 6.5–8.1)

## 2019-02-27 LAB — LACTATE DEHYDROGENASE: LDH: 236 U/L — ABNORMAL HIGH (ref 98–192)

## 2019-03-06 ENCOUNTER — Ambulatory Visit: Payer: PPO | Admitting: Internal Medicine

## 2019-03-07 ENCOUNTER — Encounter: Payer: Self-pay | Admitting: Internal Medicine

## 2019-03-07 ENCOUNTER — Other Ambulatory Visit: Payer: Self-pay

## 2019-03-07 ENCOUNTER — Other Ambulatory Visit: Payer: Self-pay | Admitting: Internal Medicine

## 2019-03-07 ENCOUNTER — Telehealth: Payer: Self-pay | Admitting: Internal Medicine

## 2019-03-07 ENCOUNTER — Inpatient Hospital Stay (HOSPITAL_BASED_OUTPATIENT_CLINIC_OR_DEPARTMENT_OTHER): Payer: PPO | Admitting: Internal Medicine

## 2019-03-07 VITALS — BP 147/67 | HR 64 | Temp 98.2°F | Resp 17 | Ht 58.5 in | Wt 113.9 lb

## 2019-03-07 DIAGNOSIS — Z79899 Other long term (current) drug therapy: Secondary | ICD-10-CM | POA: Diagnosis not present

## 2019-03-07 DIAGNOSIS — C9110 Chronic lymphocytic leukemia of B-cell type not having achieved remission: Secondary | ICD-10-CM

## 2019-03-07 DIAGNOSIS — Z885 Allergy status to narcotic agent status: Secondary | ICD-10-CM

## 2019-03-07 DIAGNOSIS — Z801 Family history of malignant neoplasm of trachea, bronchus and lung: Secondary | ICD-10-CM

## 2019-03-07 DIAGNOSIS — Z823 Family history of stroke: Secondary | ICD-10-CM

## 2019-03-07 DIAGNOSIS — I10 Essential (primary) hypertension: Secondary | ICD-10-CM | POA: Diagnosis not present

## 2019-03-07 NOTE — Telephone Encounter (Signed)
Scheduled lab, will schedule f/u when template opens. Mailed printout

## 2019-03-07 NOTE — Progress Notes (Signed)
Diagnosis CLL (chronic lymphocytic leukemia) ([place]) - Plan: CBC with Differential ([place] Only), CMP ([place] only), Lactate dehydrogenase (LDH), Beta 2 microglobulin, serum  Staging Cancer Staging No matching staging information was found for the patient.  Assessment and Plan:  1. Stage 0 CLL.   78 year old female initially seen for consultation due to Leucocytosis.  Labs done March 23, 2016 showed a white count of 9.3 hemoglobin 14.5 platelets 203,000.  She had 52% lymphocytes at that time.  Labs done 04/05/2017 showed a white count of 14 hemoglobin 14.7 platelets 210,000.  She had 60% lymphocytes at that time.  Labs done 10/10/2017 showed a white count of 13 hemoglobin 15 platelets 212,000.  She had 75% lymphocytes at that time.  Labs done July 14, 2018 showed a white count of 16 hemoglobin 14.1 platelets 190,000.  She had 82% lymphocytes at that time.  Recent labs done 10/24/2018 showed a white count of 18 hemoglobin 14.7 platelets 190,000.  She has 81% lymphocytes.  Patient denies fever, chills, night sweats, weight loss, and has noted no adenopathy.  She denies any recent infections.  She denies any new medications.  She denies any family history of leukemias or lymphomas.  Her father had lung cancer but was a heavy smoker.  The patient is a non-smoker.  She is seen today for consultation due to progressive leukocytosis and lymphocytosis.  Labs done 11/27/2018 showed WBC 24.1 HB 14.9 plts 182,000.  Smear review shows 87% lymphocytes with smudge cells noted.  Chemistries WNL with K+ 3.8 Cr 0.87 and normal LFTs.  LDH 260.  Flow cytometry is consistent with CLL.  Pt had history of lymphocytosis dating back to 2017.  She has normal HB and plt count.    Labs done 02/27/2019 reviewed and showed WBC 28.7 HB 15.4 plts 187,000.  She has 88% lymphocytes.  Chemistries showed K+ 4 Cr 1 and normal LFTs.  Pt remains asymptomatic.    Previously, natural history of CLL discussed with pt and based on  stage,  and showing no evidence of anemia or thrombocytopenia, she is recommended for observation.  Pt will RTC in 06/2019 for repeat labs.  She should notify the office if any change in symptoms prior to her next visit.   2.  Hypertension.  BP is 147/67.   Follow-up with PCP.    3.  Health maintenance.  Colonoscopy screening and mammograms as recommended.    25  minutes spent with more than 50% spent in counseling and coordination of care.    Interval History:  Historical data obtained from note dated 11/27/2018.  78 year old female initially seen for consultation due to Leucocytosis.  Labs done March 23, 2016 showed a white count of 9.3 hemoglobin 14.5 platelets 203,000.  She had 52% lymphocytes at that time.  Labs done 04/05/2017 showed a white count of 14 hemoglobin 14.7 platelets 210,000.  She had 60% lymphocytes at that time.  Labs done 10/10/2017 showed a white count of 13 hemoglobin 15 platelets 212,000.  She had 75% lymphocytes at that time.  Labs done July 14, 2018 showed a white count of 16 hemoglobin 14.1 platelets 190,000.  She had 82% lymphocytes at that time.  Recent labs done 10/24/2018 showed a white count of 18 hemoglobin 14.7 platelets 190,000.  She has 81% lymphocytes.  Patient denies fever, chills, night sweats, weight loss, and has noted no adenopathy.  She denies any recent infections.  She denies any new medications.  She denies any  family history of leukemias or lymphomas.  Her father had lung cancer but was a heavy smoker.  The patient is a non-smoker.    Current Status:  Pt is seen today for follow-up.  She is here to go over labs.  She denies fevers, chills, night sweats, weight loss, and has noted no adenopathy.    Peripheral Blood Flow Cytometry done 11/27/2018 - MONOCLONAL B-CELL POPULATION IDENTIFIED. - SEE NOTE. Diagnosis Comment: The overall findings favor chronic lymphocytic leukemia.   Problem List Patient Active Problem List   Diagnosis Date Noted  . CLL  (chronic lymphocytic leukemia) ([place]) [C91.10]     Past Medical History Past Medical History:  Diagnosis Date  . Anxiety   . CLL (chronic lymphocytic leukemia) ([place])   . Glaucoma   . Hyperlipemia   . Hypertension   . Osteoporosis     Past Surgical History No past surgical history on file.  Family History Family History  Problem Relation Age of Onset  . Stroke Mother   . Lung cancer Father      Social History  reports that she has never smoked. She has never used smokeless tobacco. She reports that she does not drink alcohol or use drugs.  Medications  Current Outpatient Medications:  .  amLODipine (NORVASC) 5 MG tablet, , Disp: , Rfl:  .  atorvastatin (LIPITOR) 40 MG tablet, Take by mouth., Disp: , Rfl:  .  brimonidine (ALPHAGAN) 0.15 % ophthalmic solution, , Disp: , Rfl:  .  Cholecalciferol (VITAMIN D3) 2000 units TABS, Take by mouth., Disp: , Rfl:  .  dorzolamide-timolol (COSOPT) 22.3-6.8 MG/ML ophthalmic solution, , Disp: , Rfl:  .  fenoprofen (NALFON) 600 MG TABS tablet, Take 600 mg by mouth., Disp: , Rfl:  .  furosemide (LASIX) 40 MG tablet, Take by mouth., Disp: , Rfl:  .  ibandronate (BONIVA) 150 MG tablet, , Disp: , Rfl:  .  latanoprost (XALATAN) 0.005 % ophthalmic solution, , Disp: , Rfl:  .  Multiple Vitamin (MULTIVITAMIN) capsule, Take 1 capsule by mouth daily., Disp: , Rfl:  .  quinapril (ACCUPRIL) 40 MG tablet, Take by mouth., Disp: , Rfl:  .  sertraline (ZOLOFT) 25 MG tablet, , Disp: , Rfl:   Allergies Codeine  Review of Systems Review of Systems - Oncology ROS negative   Physical Exam  Vitals Wt Readings from Last 3 Encounters:  03/07/19 113 lb 14.4 oz (51.7 kg)  12/05/18 114 lb 3.2 oz (51.8 kg)  11/27/18 114 lb 4.8 oz (51.8 kg)   Temp Readings from Last 3 Encounters:  03/07/19 98.2 F (36.8 C) (Oral)  12/05/18 98.3 F (36.8 C) (Oral)  11/27/18 98.2 F (36.8 C) (Oral)   BP Readings from Last 3 Encounters:  03/07/19 (!) 147/67   12/05/18 (!) 180/66  11/27/18 (!) 175/65   Pulse Readings from Last 3 Encounters:  03/07/19 64  12/05/18 65  11/27/18 (!) 59   Constitutional: Well-developed, well-nourished, and in no distress.   HENT: Head: Normocephalic and atraumatic.  Mouth/Throat: No oropharyngeal exudate. Mucosa moist. Eyes: Pupils are equal, round, and reactive to light. Conjunctivae are normal. No scleral icterus.  Neck: Normal range of motion. Neck supple. No JVD present.  Cardiovascular: Normal rate, regular rhythm and normal heart sounds.  Exam reveals no gallop and no friction rub.   No murmur heard. Pulmonary/Chest: Effort normal and breath sounds normal. No respiratory distress. No wheezes.No rales.  Abdominal: Soft. Bowel sounds are normal. No distension. There is no tenderness. There  is no guarding.  Musculoskeletal: No edema or tenderness.  Lymphadenopathy: No cervical, axillary or supraclavicular adenopathy.  Neurological: Alert and oriented to person, place, and time. No cranial nerve deficit.  Skin: Skin is warm and dry. No rash noted. No erythema. No pallor.  Psychiatric: Affect and judgment normal.   Labs No visits with results within 3 Day(s) from this visit.  Latest known visit with results is:  Appointment on 02/27/2019  Component Date Value Ref Range Status  . LDH 02/27/2019 236* 98 - 192 U/L Final   Performed at [facility], [place] [address], [place], [place] [address]  . Sodium 02/27/2019 142  135 - 145 mmol/L Final  . Potassium 02/27/2019 4.0  3.5 - 5.1 mmol/L Final  . Chloride 02/27/2019 103  98 - 111 mmol/L Final  . CO2 02/27/2019 27  22 - 32 mmol/L Final  . Glucose, Bld 02/27/2019 88  70 - 99 mg/dL Final  . BUN 02/27/2019 16  8 - 23 mg/dL Final  . Creatinine 02/27/2019 1.00  0.44 - 1.00 mg/dL Final  . Calcium 02/27/2019 10.2  8.9 - 10.3 mg/dL Final  . Total Protein 02/27/2019 7.5  6.5 - 8.1 g/dL Final  . Albumin 02/27/2019 4.8  3.5 - 5.0 g/dL Final  .  AST 02/27/2019 24  15 - 41 U/L Final  . ALT 02/27/2019 12  0 - 44 U/L Final  . Alkaline Phosphatase 02/27/2019 66  38 - 126 U/L Final  . Total Bilirubin 02/27/2019 0.6  0.3 - 1.2 mg/dL Final  . GFR, Est Non Af Am 02/27/2019 54* >60 mL/min Final  . GFR, Est AFR Am 02/27/2019 >60  >60 mL/min Final  . Anion gap 02/27/2019 12  5 - 15 Final   Performed at [facility], [place] [address], [place], [place] [address]  . WBC Count 02/27/2019 28.7* 4.0 - 10.5 K/uL Final  . RBC 02/27/2019 4.80  3.87 - 5.11 MIL/uL Final  . Hemoglobin 02/27/2019 15.4* 12.0 - 15.0 g/dL Final  . HCT 02/27/2019 47.2* 36.0 - 46.0 % Final  . MCV 02/27/2019 98.3  80.0 - 100.0 fL Final  . MCH 02/27/2019 32.1  26.0 - 34.0 pg Final  . MCHC 02/27/2019 32.6  30.0 - 36.0 g/dL Final  . RDW 02/27/2019 12.9  11.5 - 15.5 % Final  . Platelet Count 02/27/2019 187  150 - 400 K/uL Final  . nRBC 02/27/2019 0.0  0.0 - 0.2 % Final  . Neutrophils Relative % 02/27/2019 10  % Final  . Neutro Abs 02/27/2019 2.9  1.7 - 17.7 K/uL Final  . Lymphocytes Relative 02/27/2019 88  % Final  . Lymphs Abs 02/27/2019 25.3* 0.7 - 4.0 K/uL Final  . Monocytes Relative 02/27/2019 0  % Final  . Monocytes Absolute 02/27/2019 0.0* 0.1 - 1.0 K/uL Final  . Eosinophils Relative 02/27/2019 2  % Final  . Eosinophils Absolute 02/27/2019 0.6* 0.0 - 0.5 K/uL Final  . Basophils Relative 02/27/2019 0  % Final  . Basophils Absolute 02/27/2019 0.0  0.0 - 0.1 K/uL Final  . WBC Morphology 02/27/2019 ATYPICAL LYMPHS   Final  . Abs Immature Granulocytes 02/27/2019 0.00  0.00 - 0.07 K/uL Final  . Smudge Cells 02/27/2019 PRESENT   Final   Performed at [facility], [place] [address], [place], [place] [address]     Pathology Orders Placed This Encounter  Procedures  . CBC with Differential ([place])  Standing Status:   Future    Standing Expiration Date:   03/06/2020  . CMP ([place] only)    Standing Status:    Future    Standing Expiration Date:   03/06/2020  . Lactate dehydrogenase (LDH)    Standing Status:   Future    Standing Expiration Date:   03/06/2020  . Beta 2 microglobulin, serum    Standing Status:   Future    Standing Expiration Date:   03/06/2020       Zoila Shutter MD

## 2019-03-15 LAB — FISH,CLL PROGNOSTIC PANEL

## 2019-04-13 DIAGNOSIS — I10 Essential (primary) hypertension: Secondary | ICD-10-CM | POA: Diagnosis not present

## 2019-04-13 DIAGNOSIS — E782 Mixed hyperlipidemia: Secondary | ICD-10-CM | POA: Diagnosis not present

## 2019-04-13 DIAGNOSIS — E559 Vitamin D deficiency, unspecified: Secondary | ICD-10-CM | POA: Diagnosis not present

## 2019-04-13 DIAGNOSIS — H409 Unspecified glaucoma: Secondary | ICD-10-CM | POA: Diagnosis not present

## 2019-04-13 DIAGNOSIS — G250 Essential tremor: Secondary | ICD-10-CM | POA: Diagnosis not present

## 2019-04-13 DIAGNOSIS — F4320 Adjustment disorder, unspecified: Secondary | ICD-10-CM | POA: Diagnosis not present

## 2019-04-13 DIAGNOSIS — M810 Age-related osteoporosis without current pathological fracture: Secondary | ICD-10-CM | POA: Diagnosis not present

## 2019-04-13 DIAGNOSIS — D72820 Lymphocytosis (symptomatic): Secondary | ICD-10-CM | POA: Diagnosis not present

## 2019-04-17 DIAGNOSIS — E782 Mixed hyperlipidemia: Secondary | ICD-10-CM | POA: Diagnosis not present

## 2019-04-17 DIAGNOSIS — F4320 Adjustment disorder, unspecified: Secondary | ICD-10-CM | POA: Diagnosis not present

## 2019-04-17 DIAGNOSIS — M810 Age-related osteoporosis without current pathological fracture: Secondary | ICD-10-CM | POA: Diagnosis not present

## 2019-04-17 DIAGNOSIS — Z0001 Encounter for general adult medical examination with abnormal findings: Secondary | ICD-10-CM | POA: Diagnosis not present

## 2019-04-17 DIAGNOSIS — I10 Essential (primary) hypertension: Secondary | ICD-10-CM | POA: Diagnosis not present

## 2019-04-17 DIAGNOSIS — C9110 Chronic lymphocytic leukemia of B-cell type not having achieved remission: Secondary | ICD-10-CM | POA: Diagnosis not present

## 2019-04-17 DIAGNOSIS — H409 Unspecified glaucoma: Secondary | ICD-10-CM | POA: Diagnosis not present

## 2019-04-18 ENCOUNTER — Other Ambulatory Visit: Payer: Self-pay | Admitting: Family Medicine

## 2019-04-18 DIAGNOSIS — H401132 Primary open-angle glaucoma, bilateral, moderate stage: Secondary | ICD-10-CM | POA: Diagnosis not present

## 2019-04-18 DIAGNOSIS — H524 Presbyopia: Secondary | ICD-10-CM | POA: Diagnosis not present

## 2019-04-18 DIAGNOSIS — H5340 Unspecified visual field defects: Secondary | ICD-10-CM | POA: Diagnosis not present

## 2019-04-18 DIAGNOSIS — H5213 Myopia, bilateral: Secondary | ICD-10-CM | POA: Diagnosis not present

## 2019-04-18 DIAGNOSIS — H52223 Regular astigmatism, bilateral: Secondary | ICD-10-CM | POA: Diagnosis not present

## 2019-04-18 DIAGNOSIS — M810 Age-related osteoporosis without current pathological fracture: Secondary | ICD-10-CM

## 2019-05-03 ENCOUNTER — Telehealth: Payer: Self-pay

## 2019-05-03 NOTE — Telephone Encounter (Signed)
Patient called and stated she recently went to her PCP [name] and was told that she "has moved into the Leukemia stage". Patient was formerly seen by Dr. Walden Field who is no longer in the office. Dr. Burr Medico reviewed labs and stated patient should be seen in the next 2-3 weeks by Dr. Irene Limbo or one of the other [place] physicians. Audie Clear in scheduling made aware and will get appointment scheduled. Patient aware to expect a call from scheduling.

## 2019-05-10 ENCOUNTER — Telehealth: Payer: Self-pay | Admitting: Hematology

## 2019-05-10 NOTE — Telephone Encounter (Signed)
[facility] transfer to [place]. Confirmed 9/17 appointment with patient.

## 2019-05-29 NOTE — Progress Notes (Signed)
HEMATOLOGY/ONCOLOGY CONSULTATION NOTE  Date of Service: 05/29/2019  Patient Care Team: [name], MD as PCP - General (Family Medicine)  CHIEF COMPLAINTS/PURPOSE OF CONSULTATION:  CLL  HISTORY OF PRESENTING ILLNESS:   Selena Branch is a wonderful 78 y.o. female who has been transferred to [place] from Dr. Mathis Dad [place] for evaluation and management of CLL. The pt reports that she is doing well overall.  The pt reports that has been slightly fatigued in the past 1-2 months, but thinks it is from the heat. The fatigue does not affect her ability to function. Denies fevers, chills, and night sweats. She reports that she saw her PCP in August, who noticed that her blood counts were high and referred her to Dr. Irene Limbo.  Lab results today (05/31/2019) of CBC w diff and CMP is as follows: all values are WNL except for WBC at 39.1k, neutro abs at 1.6k, lymphs abs at 36.8k, monocytes abs at 0.0k, eosinophils abs at 0.8k, BUN at 27, Total protein at 6.4 05/31/2019 LDH is pending  On review of systems, pt reports mild fatigue and denies belly pain, infection issues and any other symptoms.   MEDICAL HISTORY:  Past Medical History:  Diagnosis Date   Anxiety    CLL (chronic lymphocytic leukemia) (HCC)    Glaucoma    Hyperlipemia    Hypertension    Osteoporosis     SURGICAL HISTORY: No past surgical history on file.  SOCIAL HISTORY: Social History   Socioeconomic History   Marital status: Married    Spouse name: Not on file   Number of children: 1   Years of education: HS   Highest education level: Not on file  Occupational History   Occupation: Retired  Scientist, product/process development strain: Not on file   Food insecurity    Worry: Not on file    Inability: Not on Lexicographer needs    Medical: Not on file    Non-medical: Not on file  Tobacco Use   Smoking status: Never Smoker   Smokeless tobacco: Never Used  Substance and Sexual Activity    Alcohol use: No    Alcohol/week: 0.0 standard drinks   Drug use: No   Sexual activity: Not on file  Lifestyle   Physical activity    Days per week: Not on file    Minutes per session: Not on file   Stress: Not on file  Relationships   Social connections    Talks on phone: Not on file    Gets together: Not on file    Attends religious service: Not on file    Active member of club or organization: Not on file    Attends meetings of clubs or organizations: Not on file    Relationship status: Not on file   Intimate partner violence    Fear of current or ex partner: Not on file    Emotionally abused: Not on file    Physically abused: Not on file    Forced sexual activity: Not on file  Other Topics Concern   Not on file  Social History Narrative   Drinks 2 cups of coffee    FAMILY HISTORY: Family History  Problem Relation Age of Onset   Stroke Mother    Lung cancer Father     ALLERGIES:  is allergic to codeine.  MEDICATIONS:  Current Outpatient Medications  Medication Sig Dispense Refill   amLODipine (NORVASC) 5 MG tablet  atorvastatin (LIPITOR) 40 MG tablet Take by mouth.     brimonidine (ALPHAGAN) 0.15 % ophthalmic solution      Cholecalciferol (VITAMIN D3) 2000 units TABS Take by mouth.     dorzolamide-timolol (COSOPT) 22.3-6.8 MG/ML ophthalmic solution      fenoprofen (NALFON) 600 MG TABS tablet Take 600 mg by mouth.     furosemide (LASIX) 40 MG tablet Take by mouth.     ibandronate (BONIVA) 150 MG tablet      latanoprost (XALATAN) 0.005 % ophthalmic solution      Multiple Vitamin (MULTIVITAMIN) capsule Take 1 capsule by mouth daily.     quinapril (ACCUPRIL) 40 MG tablet Take by mouth.     sertraline (ZOLOFT) 25 MG tablet      No current facility-administered medications for this visit.     REVIEW OF SYSTEMS:    A 10+ POINT REVIEW OF SYSTEMS WAS OBTAINED including neurology, dermatology, psychiatry, cardiac, respiratory, lymph,  extremities, GI, GU, Musculoskeletal, constitutional, breasts, reproductive, HEENT.  All pertinent positives are noted in the HPI.  All others are negative.   PHYSICAL EXAMINATION: ECOG PERFORMANCE STATUS: 1 - Symptomatic but completely ambulatory  . Vitals:   05/31/19 0852  BP: 135/80  Pulse: 66  Resp: 17  Temp: 97.7 F (36.5 C)  SpO2: 100%   Filed Weights   05/31/19 0852  Weight: 113 lb 6.4 oz (51.4 kg)   .Body mass index is 23.3 kg/m.  GENERAL:alert, in no acute distress and comfortable SKIN: no acute rashes, no significant lesions EYES: conjunctiva are pink and non-injected, sclera anicteric OROPHARYNX: MMM, no exudates, no oropharyngeal erythema or ulceration NECK: supple, no JVD LYMPH:  no palpable lymphadenopathy in the cervical, axillary or inguinal regions LUNGS: clear to auscultation b/l with normal respiratory effort HEART: regular rate & rhythm ABDOMEN:  normoactive bowel sounds , non tender, not distended. Extremity: no pedal edema PSYCH: alert & oriented x 3 with fluent speech NEURO: no focal motor/sensory deficits  LABORATORY DATA:  I have reviewed the data as listed  . CBC Latest Ref Rng & Units 02/27/2019 11/27/2018 08/09/2018  WBC 4.0 - 10.5 K/uL 28.7(H) 24.1(H) -  Hemoglobin 12.0 - 15.0 g/dL 15.4(H) 14.9 13.9  Hematocrit 36.0 - 46.0 % 47.2(H) 44.9 41.0  Platelets 150 - 400 K/uL 187 182 -    . CMP Latest Ref Rng & Units 02/27/2019 11/27/2018 08/09/2018  Glucose 70 - 99 mg/dL 88 97 122(H)  BUN 8 - 23 mg/dL 16 16 24(H)  Creatinine 0.44 - 1.00 mg/dL 1.00 0.87 0.80  Sodium 135 - 145 mmol/L 142 141 140  Potassium 3.5 - 5.1 mmol/L 4.0 3.8 3.9  Chloride 98 - 111 mmol/L 103 106 110  CO2 22 - 32 mmol/L 27 24 -  Calcium 8.9 - 10.3 mg/dL 10.2 9.8 -  Total Protein 6.5 - 8.1 g/dL 7.5 7.5 -  Total Bilirubin 0.3 - 1.2 mg/dL 0.6 0.6 -  Alkaline Phos 38 - 126 U/L 66 74 -  AST 15 - 41 U/L 24 22 -  ALT 0 - 44 U/L 12 14 -   02/27/2019 FISH:    RADIOGRAPHIC  STUDIES: I have personally reviewed the radiological images as listed and agreed with the findings in the report. No results found.  ASSESSMENT & PLAN:   #1 Stage 0 CLL -genetic tests are negative  PLAN: -Discussed patient's most recent labs from 03/07/2019, blood counts are high, chemistries are stable -Discussed that the pt has stage 0 CLL -- white blood  counts are increased, but pt does not have splenomegaly or other abnormal blood counts. -Discussed that at this stage, her CLL does not meet treatment criteria. If the pt experiences debilitating fatigue, or her PLT count changes, will consider treatment.  -Plan to monitor overtime with labs -All genetic tests were negative -The pt was given handouts with information about CLL. -Will see the pt back in 3 months. If stable, will space out appointments.   RTC with Dr Irene Limbo with labs in 3 months   Orders Placed This Encounter  Procedures   CBC with Differential/Platelet    Standing Status:   Future    Standing Expiration Date:   07/04/2020   CMP ([place] only)    Standing Status:   Future    Standing Expiration Date:   05/30/2020   Lactate dehydrogenase    Standing Status:   Future    Standing Expiration Date:   05/30/2020    All of the patients questions were answered with apparent satisfaction. The patient knows to call the clinic with any problems, questions or concerns.  I spent 20 counseling the patient face to face. The total time spent in the appointment was 33mins and more than 50% was on counseling and direct patient cares.   Sullivan Lone MD MS AAHIVMS [facility] Hematology/Oncology Physician [facility]  (Office):       [phone] (Work cell):  [phone] (Fax):           [phone]  05/29/2019 3:14 PM  I, De Burrs, am acting as a scribe for Dr. Irene Limbo  .I have reviewed the above documentation for accuracy and completeness, and I agree with the above. [name] MD

## 2019-05-30 ENCOUNTER — Other Ambulatory Visit: Payer: Self-pay | Admitting: *Deleted

## 2019-05-30 DIAGNOSIS — C9110 Chronic lymphocytic leukemia of B-cell type not having achieved remission: Secondary | ICD-10-CM

## 2019-05-31 ENCOUNTER — Telehealth: Payer: Self-pay | Admitting: Hematology

## 2019-05-31 ENCOUNTER — Other Ambulatory Visit: Payer: Self-pay

## 2019-05-31 ENCOUNTER — Inpatient Hospital Stay: Payer: PPO | Attending: Hematology | Admitting: Hematology

## 2019-05-31 ENCOUNTER — Inpatient Hospital Stay: Payer: PPO

## 2019-05-31 VITALS — BP 135/80 | HR 66 | Temp 97.7°F | Resp 17 | Ht 58.5 in | Wt 113.4 lb

## 2019-05-31 DIAGNOSIS — I10 Essential (primary) hypertension: Secondary | ICD-10-CM | POA: Insufficient documentation

## 2019-05-31 DIAGNOSIS — Z79899 Other long term (current) drug therapy: Secondary | ICD-10-CM | POA: Diagnosis not present

## 2019-05-31 DIAGNOSIS — C9110 Chronic lymphocytic leukemia of B-cell type not having achieved remission: Secondary | ICD-10-CM | POA: Diagnosis not present

## 2019-05-31 DIAGNOSIS — R5383 Other fatigue: Secondary | ICD-10-CM | POA: Diagnosis not present

## 2019-05-31 DIAGNOSIS — E785 Hyperlipidemia, unspecified: Secondary | ICD-10-CM | POA: Insufficient documentation

## 2019-05-31 LAB — CMP (CANCER CENTER ONLY)
ALT: 17 U/L (ref 0–44)
AST: 25 U/L (ref 15–41)
Albumin: 4.2 g/dL (ref 3.5–5.0)
Alkaline Phosphatase: 64 U/L (ref 38–126)
Anion gap: 7 (ref 5–15)
BUN: 27 mg/dL — ABNORMAL HIGH (ref 8–23)
CO2: 25 mmol/L (ref 22–32)
Calcium: 9.3 mg/dL (ref 8.9–10.3)
Chloride: 110 mmol/L (ref 98–111)
Creatinine: 0.85 mg/dL (ref 0.44–1.00)
GFR, Est AFR Am: >60 mL/min (ref >60)
GFR, Estimated: >60 mL/min (ref >60)
Glucose, Bld: 99 mg/dL (ref 70–99)
Potassium: 4.5 mmol/L (ref 3.5–5.1)
Sodium: 142 mmol/L (ref 135–145)
Total Bilirubin: 0.5 mg/dL (ref 0.3–1.2)
Total Protein: 6.4 g/dL — ABNORMAL LOW (ref 6.5–8.1)

## 2019-05-31 LAB — LACTATE DEHYDROGENASE: LDH: 266 U/L — ABNORMAL HIGH (ref 98–192)

## 2019-05-31 LAB — CBC WITH DIFFERENTIAL (CANCER CENTER ONLY)
Abs Immature Granulocytes: 0 10*3/uL (ref 0.00–0.07)
Basophils Absolute: 0 10*3/uL (ref 0.0–0.1)
Basophils Relative: 0 %
Eosinophils Absolute: 0.8 10*3/uL — ABNORMAL HIGH (ref 0.0–0.5)
Eosinophils Relative: 2 %
HCT: 40.7 % (ref 36.0–46.0)
Hemoglobin: 13.7 g/dL (ref 12.0–15.0)
Lymphocytes Relative: 94 %
Lymphs Abs: 36.8 10*3/uL — ABNORMAL HIGH (ref 0.7–4.0)
MCH: 31.9 pg (ref 26.0–34.0)
MCHC: 33.7 g/dL (ref 30.0–36.0)
MCV: 94.7 fL (ref 80.0–100.0)
Monocytes Absolute: 0 10*3/uL — ABNORMAL LOW (ref 0.1–1.0)
Monocytes Relative: 0 %
Neutro Abs: 1.6 10*3/uL — ABNORMAL LOW (ref 1.7–17.7)
Neutrophils Relative %: 4 %
Platelet Count: 176 10*3/uL (ref 150–400)
RBC: 4.3 MIL/uL (ref 3.87–5.11)
RDW: 12.8 % (ref 11.5–15.5)
WBC Count: 39.1 10*3/uL — ABNORMAL HIGH (ref 4.0–10.5)
nRBC: 0 % (ref 0.0–0.2)

## 2019-05-31 NOTE — Telephone Encounter (Signed)
Scheduled per 09/17 los, patient received after visit summary and calender. °

## 2019-07-02 ENCOUNTER — Ambulatory Visit
Admission: RE | Admit: 2019-07-02 | Discharge: 2019-07-02 | Disposition: A | Discharge status: Home / Self Care | Payer: PPO | Source: Ambulatory Visit | Attending: Family Medicine | Admitting: Family Medicine

## 2019-07-02 ENCOUNTER — Other Ambulatory Visit: Payer: Self-pay

## 2019-07-02 DIAGNOSIS — M8589 Other specified disorders of bone density and structure, multiple sites: Secondary | ICD-10-CM | POA: Diagnosis not present

## 2019-07-02 DIAGNOSIS — M810 Age-related osteoporosis without current pathological fracture: Secondary | ICD-10-CM

## 2019-07-02 DIAGNOSIS — Z780 Asymptomatic menopausal state: Secondary | ICD-10-CM | POA: Diagnosis not present

## 2019-07-11 ENCOUNTER — Other Ambulatory Visit: Payer: PPO

## 2019-08-22 DIAGNOSIS — H52223 Regular astigmatism, bilateral: Secondary | ICD-10-CM | POA: Diagnosis not present

## 2019-08-22 DIAGNOSIS — H5213 Myopia, bilateral: Secondary | ICD-10-CM | POA: Diagnosis not present

## 2019-08-22 DIAGNOSIS — H524 Presbyopia: Secondary | ICD-10-CM | POA: Diagnosis not present

## 2019-08-22 DIAGNOSIS — H401132 Primary open-angle glaucoma, bilateral, moderate stage: Secondary | ICD-10-CM | POA: Diagnosis not present

## 2019-08-29 NOTE — Progress Notes (Signed)
HEMATOLOGY/ONCOLOGY CONSULTATION NOTE  Date of Service: 08/30/2019  Patient Care Team: [name], MD as PCP - General (Family Medicine)  CHIEF COMPLAINTS/PURPOSE OF CONSULTATION:  CLL  HISTORY OF PRESENTING ILLNESS:   Selena Branch is a wonderful 78 y.o. female who has been transferred to [place] from Dr. Mathis Dad [place] for evaluation and management of CLL. The pt reports that she is doing well overall.  The pt reports that has been slightly fatigued in the past 1-2 months, but thinks it is from the heat. The fatigue does not affect her ability to function. Denies fevers, chills, and night sweats. She reports that she saw her PCP in August, who noticed that her blood counts were high and referred her to Dr. Irene Limbo.  Lab results today (05/31/2019) of CBC w diff and CMP is as follows: all values are WNL except for WBC at 39.1k, neutro abs at 1.6k, lymphs abs at 36.8k, monocytes abs at 0.0k, eosinophils abs at 0.8k, BUN at 27, Total protein at 6.4 05/31/2019 LDH is pending  On review of systems, pt reports mild fatigue and denies belly pain, infection issues and any other symptoms.   INTERVAL HISTORY:  Selena Branch is a 78 y.o. 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 The patient's last visit with [place] was on 05/31/2019. The pt reports that she is doing well overall.  The pt reports she has lost 4lbs since her visit on 05/31/2019. She is not activity trying to lose weight.  She is not taking any new medications or any new herbal supplements.  She has always bruised easily but she hasn't noticed any significant changes.   Lab results today 08/30/19 of CBC w/diff and CMP is as follows: all values are WNL except for Glucose Bld at 100, Total protein at 6.3, LDH at 548, ,HGB at 11.4, WBC at 77.6, PLT <5..  On review of systems, pt reports some weight loss, acid relux and denies fevers, chills, night sweats, changes in energy, nose bleeds, gum bleeds, back pain, abdominal pain,  pedal edema and any other symptoms.   MEDICAL HISTORY:  Past Medical History:  Diagnosis Date  . Anxiety   . CLL (chronic lymphocytic leukemia) ([place])   . Glaucoma   . Hyperlipemia   . Hypertension   . Osteoporosis     SURGICAL HISTORY: No past surgical history on file.  SOCIAL HISTORY: Social History   Socioeconomic History  . Marital status: Married    Spouse name: Not on file  . Number of children: 1  . Years of education: HS  . Highest education level: Not on file  Occupational History  . Occupation: Retired  Tobacco Use  . Smoking status: Never Smoker  . Smokeless tobacco: Never Used  Substance and Sexual Activity  . Alcohol use: No    Alcohol/week: 0.0 standard drinks  . Drug use: No  . Sexual activity: Not on file  Other Topics Concern  . Not on file  Social History Narrative   Drinks 2 cups of coffee   Social Determinants of Health   Financial Resource Strain:   . Difficulty of Paying Living Expenses: Not on file  Food Insecurity:   . Worried About Charity fundraiser in the Last Year: Not on file  . Ran Out of Food in the Last Year: Not on file  Transportation Needs:   . Lack of Transportation (Medical): Not on file  . Lack of Transportation (Non-Medical): Not on file  Physical  Activity:   . Days of Exercise per Week: Not on file  . Minutes of Exercise per Session: Not on file  Stress:   . Feeling of Stress : Not on file  Social Connections:   . Frequency of Communication with Friends and Family: Not on file  . Frequency of Social Gatherings with Friends and Family: Not on file  . Attends Religious Services: Not on file  . Active Member of Clubs or Organizations: Not on file  . Attends Archivist Meetings: Not on file  . Marital Status: Not on file  Intimate Partner Violence:   . Fear of Current or Ex-Partner: Not on file  . Emotionally Abused: Not on file  . Physically Abused: Not on file  . Sexually Abused: Not on file    FAMILY  HISTORY: Family History  Problem Relation Age of Onset  . Stroke Mother   . Lung cancer Father     ALLERGIES:  is allergic to codeine.  MEDICATIONS:  Current Outpatient Medications  Medication Sig Dispense Refill  . amLODipine (NORVASC) 5 MG tablet     . atorvastatin (LIPITOR) 40 MG tablet Take by mouth.    . brimonidine (ALPHAGAN) 0.15 % ophthalmic solution     . Cholecalciferol (VITAMIN D3) 2000 units TABS Take by mouth.    . dorzolamide-timolol (COSOPT) 22.3-6.8 MG/ML ophthalmic solution     . fenoprofen (NALFON) 600 MG TABS tablet Take 600 mg by mouth.    . furosemide (LASIX) 40 MG tablet Take by mouth.    . ibandronate (BONIVA) 150 MG tablet     . latanoprost (XALATAN) 0.005 % ophthalmic solution     . Multiple Vitamin (MULTIVITAMIN) capsule Take 1 capsule by mouth daily.    . quinapril (ACCUPRIL) 40 MG tablet Take by mouth.    . sertraline (ZOLOFT) 25 MG tablet      No current facility-administered medications for this visit.    REVIEW OF SYSTEMS:   A 10+ POINT REVIEW OF SYSTEMS WAS OBTAINED including neurology, dermatology, psychiatry, cardiac, respiratory, lymph, extremities, GI, GU, Musculoskeletal, constitutional, breasts, reproductive, HEENT.  All pertinent positives are noted in the HPI.  All others are negative.    PHYSICAL EXAMINATION: ECOG FS:2 - Symptomatic, <50% confined to bed  Vitals:   08/30/19 0945  BP: (!) 168/66  Pulse: 81  Resp: 18  Temp: 97.8 F (36.6 C)  SpO2: 100%   Wt Readings from Last 3 Encounters:  08/30/19 109 lb 11.2 oz (49.8 kg)  05/31/19 113 lb 6.4 oz (51.4 kg)  03/07/19 113 lb 14.4 oz (51.7 kg)   Body mass index is 22.54 kg/m.    GENERAL:alert, in no acute distress and comfortable SKIN: no acute rashes, no significant lesions EYES: conjunctiva are pink and non-injected, sclera anicteric OROPHARYNX: MMM, no exudates, no oropharyngeal erythema or ulceration NECK: supple, no JVD LYMPH:  no palpable lymphadenopathy in the  cervical, axillary or inguinal regions LUNGS: clear to auscultation b/l with normal respiratory effort HEART: regular rate & rhythm ABDOMEN:  normoactive bowel sounds , non tender, not distended. Extremity: no pedal edema PSYCH: alert & oriented x 3 with fluent speech NEURO: no focal motor/sensory deficits  LABORATORY DATA:  I have reviewed the data as listed  . CBC Latest Ref Rng & Units 08/30/2019 08/30/2019 05/31/2019  WBC 4.0 - 10.5 K/uL 84.3(HH) 77.6(HH) 39.1(H)  Hemoglobin 12.0 - 15.0 g/dL 11.7(L) 11.4(L) 13.7  Hematocrit 36.0 - 46.0 % 36.8 35.9(L) 40.7  Platelets 150 - 400  K/uL 6(LL) <5(LL) 176    . CMP Latest Ref Rng & Units 08/30/2019 08/30/2019 05/31/2019  Glucose 70 - 99 mg/dL 144(H) 100(H) 99  BUN 8 - 23 mg/dL 25(H) 23 27(H)  Creatinine 0.44 - 1.00 mg/dL 0.91 0.82 0.85  Sodium 135 - 145 mmol/L 139 144 142  Potassium 3.5 - 5.1 mmol/L 4.7 4.5 4.5  Chloride 98 - 111 mmol/L 109 110 110  CO2 22 - 32 mmol/L 20(L) 25 25  Calcium 8.9 - 10.3 mg/dL 9.8 9.3 9.3  Total Protein 6.5 - 8.1 g/dL - 6.3(L) 6.4(L)  Total Bilirubin 0.3 - 1.2 mg/dL - 0.7 0.5  Alkaline Phos 38 - 126 U/L - 95 64  AST 15 - 41 U/L - 32 25  ALT 0 - 44 U/L - 18 17   02/27/2019 FISH:    RADIOGRAPHIC STUDIES: I have personally reviewed the radiological images as listed and agreed with the findings in the report. No results found.  ASSESSMENT & PLAN:   #1 CLL Previous CLL prognostic FISH panel without detectable mutations. Has been Rai stage 0.. Now with new anemia and severe thrombocytopenia. WBC counts have increased significantly to 84k from 39k 3 months ago.  Concerning for change in tempo of her disease.  #2 severe thrombocytopenia with platelet counts around 5k likely related to CLL.  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  However cannot rule out bone marrow involvement as an etiology of thrombocytopenia as well.  #3 new mild normocytic anemia.  Hemoglobin 11.7  with an MCV of 97.4. Likely related to her CLL. Elevated LDH level in the 500s would need to rule out autoimmune hemolysis. Haptoglobin pending Coombs test positive for IgG suggesting could be an element of autoimmune hemolysis.  PLAN: -Discussed pt labwork today, 08/30/19; all values are WNL except for Glucose Bld at 100, Total protein at 6.3, LDH at 548,HGB at 11.4, WBC at 77.6, PLT <5. -Discussed 08/30/19 Glucose at 100 -Discussed 08/30/19 total protein at 6.3 -Discussed 08/30/19 LDH at 548 -Discussed 08/30/19 Platelets at 5. Advised pt that I would like to redo labs due to significantly low numbers before determining if further treatment is needed.   -Patient's labs were repeated and the platelet counts are stable very low at [place] . -She was recommended to proceed to the emergency room for further evaluation and management given her seriously low platelet counts. -Would recommend transfusion of 1 unit of platelets with a 30-minute and 60-minute post count to determine if there is a bump in platelets or rapid removal of platelets like with ITP. -CT chest abdomen pelvis for evaluation of her CLL. -We will likely need bone marrow biopsy. 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 -Might need to consider treating CLL if CT chest abdomen pelvis and bone marrow biopsy suggest significant burden of disease.  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 Further follow-up based on inpatient treatment outcome.    Orders Placed This Encounter  Procedures  . CBC with Differential/Platelet    Standing Status:   Future    Number of Occurrences:   1    Standing Expiration Date:   10/03/2020  . Lactate dehydrogenase    Standing Status:   Future    Number of Occurrences:  1    Standing Expiration Date:   08/29/2020  . Haptoglobin    Standing Status:   Future    Number of Occurrences:   1    Standing Expiration Date:   08/29/2020  . Reticulocytes    Standing Status:   Future    Number of Occurrences:   1    Standing Expiration Date:   08/29/2020  . Direct antiglobulin test    Standing Status:   Future    Number of Occurrences:   1    Standing Expiration Date:   10/03/2020    The total time spent in the appt was 40 minutes and more than 50% was on counseling and direct patient cares.  All of the patient's questions were answered with apparent satisfaction. The patient knows to call the clinic with any problems, questions or concerns.    Sullivan Lone MD MS AAHIVMS [facility] Hematology/Oncology Physician [facility]  (Office):       [phone] (Work cell):  [phone] (Fax):           [phone]  08/29/2019 4:17 PM  I, Scot Dock, am acting as a scribe for Dr. Sullivan Lone.   .I have reviewed the above documentation for accuracy and completeness, and I agree with the above. [name] MD

## 2019-08-30 ENCOUNTER — Telehealth: Payer: Self-pay | Admitting: *Deleted

## 2019-08-30 ENCOUNTER — Inpatient Hospital Stay (HOSPITAL_COMMUNITY)
Admission: EM | Admit: 2019-08-30 | Discharge: 2019-09-01 | DRG: 813 | Disposition: A | Discharge status: Home / Self Care | Payer: PPO | Source: Ambulatory Visit | Attending: Internal Medicine | Admitting: Internal Medicine

## 2019-08-30 ENCOUNTER — Inpatient Hospital Stay (HOSPITAL_BASED_OUTPATIENT_CLINIC_OR_DEPARTMENT_OTHER): Payer: PPO | Admitting: Hematology

## 2019-08-30 ENCOUNTER — Encounter (HOSPITAL_COMMUNITY): Payer: Self-pay | Admitting: Emergency Medicine

## 2019-08-30 ENCOUNTER — Other Ambulatory Visit: Payer: Self-pay

## 2019-08-30 ENCOUNTER — Inpatient Hospital Stay: Payer: PPO | Attending: Hematology

## 2019-08-30 ENCOUNTER — Telehealth: Payer: Self-pay | Admitting: Oncology

## 2019-08-30 ENCOUNTER — Inpatient Hospital Stay: Payer: PPO

## 2019-08-30 VITALS — BP 168/66 | HR 81 | Temp 97.8°F | Resp 18 | Ht 58.5 in | Wt 109.7 lb

## 2019-08-30 DIAGNOSIS — C9110 Chronic lymphocytic leukemia of B-cell type not having achieved remission: Secondary | ICD-10-CM

## 2019-08-30 DIAGNOSIS — M810 Age-related osteoporosis without current pathological fracture: Secondary | ICD-10-CM | POA: Diagnosis present

## 2019-08-30 DIAGNOSIS — D696 Thrombocytopenia, unspecified: Secondary | ICD-10-CM

## 2019-08-30 DIAGNOSIS — D630 Anemia in neoplastic disease: Secondary | ICD-10-CM | POA: Diagnosis present

## 2019-08-30 DIAGNOSIS — Z79899 Other long term (current) drug therapy: Secondary | ICD-10-CM

## 2019-08-30 DIAGNOSIS — D649 Anemia, unspecified: Secondary | ICD-10-CM | POA: Diagnosis not present

## 2019-08-30 DIAGNOSIS — I10 Essential (primary) hypertension: Secondary | ICD-10-CM | POA: Insufficient documentation

## 2019-08-30 DIAGNOSIS — R7402 Elevation of levels of lactic acid dehydrogenase (LDH): Secondary | ICD-10-CM | POA: Diagnosis present

## 2019-08-30 DIAGNOSIS — Z801 Family history of malignant neoplasm of trachea, bronchus and lung: Secondary | ICD-10-CM | POA: Insufficient documentation

## 2019-08-30 DIAGNOSIS — M7989 Other specified soft tissue disorders: Secondary | ICD-10-CM | POA: Diagnosis present

## 2019-08-30 DIAGNOSIS — D693 Immune thrombocytopenic purpura: Secondary | ICD-10-CM | POA: Diagnosis present

## 2019-08-30 DIAGNOSIS — Z7983 Long term (current) use of bisphosphonates: Secondary | ICD-10-CM | POA: Diagnosis not present

## 2019-08-30 DIAGNOSIS — H409 Unspecified glaucoma: Secondary | ICD-10-CM | POA: Diagnosis present

## 2019-08-30 DIAGNOSIS — F419 Anxiety disorder, unspecified: Secondary | ICD-10-CM | POA: Diagnosis present

## 2019-08-30 DIAGNOSIS — Z823 Family history of stroke: Secondary | ICD-10-CM

## 2019-08-30 DIAGNOSIS — R609 Edema, unspecified: Secondary | ICD-10-CM | POA: Diagnosis not present

## 2019-08-30 DIAGNOSIS — E785 Hyperlipidemia, unspecified: Secondary | ICD-10-CM | POA: Diagnosis present

## 2019-08-30 DIAGNOSIS — Z20828 Contact with and (suspected) exposure to other viral communicable diseases: Secondary | ICD-10-CM | POA: Diagnosis present

## 2019-08-30 DIAGNOSIS — Z03818 Encounter for observation for suspected exposure to other biological agents ruled out: Secondary | ICD-10-CM | POA: Diagnosis not present

## 2019-08-30 LAB — CBC WITH DIFFERENTIAL/PLATELET
Abs Immature Granulocytes: 0.13 10*3/uL — ABNORMAL HIGH (ref 0.00–0.07)
Basophils Absolute: 0 10*3/uL (ref 0.0–0.1)
Basophils Absolute: 0.3 10*3/uL — ABNORMAL HIGH (ref 0.0–0.1)
Basophils Relative: 0 %
Basophils Relative: 0 %
Eosinophils Absolute: 2.3 10*3/uL — ABNORMAL HIGH (ref 0.0–0.5)
Eosinophils Absolute: 0.7 10*3/uL — ABNORMAL HIGH (ref 0.0–0.5)
Eosinophils Relative: 3 %
Eosinophils Relative: 1 %
HCT: 35.9 % — ABNORMAL LOW (ref 36.0–46.0)
HCT: 36.8 % (ref 36.0–46.0)
Hemoglobin: 11.4 g/dL — ABNORMAL LOW (ref 12.0–15.0)
Hemoglobin: 11.7 g/dL — ABNORMAL LOW (ref 12.0–15.0)
Immature Granulocytes: 0 %
Lymphocytes Relative: 90 %
Lymphocytes Relative: 80 %
Lymphs Abs: 69.8 10*3/uL — ABNORMAL HIGH (ref 0.7–4.0)
Lymphs Abs: 67.3 10*3/uL — ABNORMAL HIGH (ref 0.7–4.0)
MCH: 30.8 pg (ref 26.0–34.0)
MCH: 31 pg (ref 26.0–34.0)
MCHC: 31.8 g/dL (ref 30.0–36.0)
MCHC: 31.8 g/dL (ref 30.0–36.0)
MCV: 97.1 fL (ref 80.0–100.0)
MCV: 97.4 fL (ref 80.0–100.0)
Monocytes Absolute: 1.6 10*3/uL — ABNORMAL HIGH (ref 0.1–1.0)
Monocytes Absolute: 12.3 10*3/uL — ABNORMAL HIGH (ref 0.1–1.0)
Monocytes Relative: 2 %
Monocytes Relative: 15 %
Neutro Abs: 3.9 10*3/uL (ref 1.7–7.7)
Neutro Abs: 3.6 10*3/uL (ref 1.7–7.7)
Neutrophils Relative %: 5 %
Neutrophils Relative %: 4 %
Platelets: <5 10*3/uL — CL (ref 150–400)
Platelets: 6 10*3/uL — CL (ref 150–400)
RBC: 3.7 MIL/uL — ABNORMAL LOW (ref 3.87–5.11)
RBC: 3.78 MIL/uL — ABNORMAL LOW (ref 3.87–5.11)
RDW: 14 % (ref 11.5–15.5)
RDW: 14 % (ref 11.5–15.5)
WBC: 77.6 10*3/uL (ref 4.0–10.5)
WBC: 84.3 10*3/uL (ref 4.0–10.5)
nRBC: 0 % (ref 0.0–0.2)
nRBC: 0.1 % (ref 0.0–0.2)

## 2019-08-30 LAB — CMP (CANCER CENTER ONLY)
ALT: 18 U/L (ref 0–44)
AST: 32 U/L (ref 15–41)
Albumin: 3.9 g/dL (ref 3.5–5.0)
Alkaline Phosphatase: 95 U/L (ref 38–126)
Anion gap: 9 (ref 5–15)
BUN: 23 mg/dL (ref 8–23)
CO2: 25 mmol/L (ref 22–32)
Calcium: 9.3 mg/dL (ref 8.9–10.3)
Chloride: 110 mmol/L (ref 98–111)
Creatinine: 0.82 mg/dL (ref 0.44–1.00)
GFR, Est AFR Am: >60 mL/min (ref >60)
GFR, Estimated: >60 mL/min (ref >60)
Glucose, Bld: 100 mg/dL — ABNORMAL HIGH (ref 70–99)
Potassium: 4.5 mmol/L (ref 3.5–5.1)
Sodium: 144 mmol/L (ref 135–145)
Total Bilirubin: 0.7 mg/dL (ref 0.3–1.2)
Total Protein: 6.3 g/dL — ABNORMAL LOW (ref 6.5–8.1)

## 2019-08-30 LAB — RETICULOCYTES
Immature Retic Fract: 23.9 % — ABNORMAL HIGH (ref 2.3–15.9)
RBC.: 4.03 MIL/uL (ref 3.87–5.11)
Retic Count, Absolute: 91.5 10*3/uL (ref 19.0–186.0)
Retic Ct Pct: 2.3 % (ref 0.4–3.1)

## 2019-08-30 LAB — BASIC METABOLIC PANEL
Anion gap: 10 (ref 5–15)
BUN: 25 mg/dL — ABNORMAL HIGH (ref 8–23)
CO2: 20 mmol/L — ABNORMAL LOW (ref 22–32)
Calcium: 9.8 mg/dL (ref 8.9–10.3)
Chloride: 109 mmol/L (ref 98–111)
Creatinine, Ser: 0.91 mg/dL (ref 0.44–1.00)
GFR calc Af Amer: >60 mL/min (ref >60)
GFR calc non Af Amer: >60 mL/min (ref >60)
Glucose, Bld: 144 mg/dL — ABNORMAL HIGH (ref 70–99)
Potassium: 4.7 mmol/L (ref 3.5–5.1)
Sodium: 139 mmol/L (ref 135–145)

## 2019-08-30 LAB — DIRECT ANTIGLOBULIN TEST (NOT AT ARMC)
DAT, IgG: POSITIVE
DAT, complement: NEGATIVE

## 2019-08-30 LAB — SARS CORONAVIRUS 2 (TAT 6-24 HRS): SARS Coronavirus 2: NEGATIVE

## 2019-08-30 LAB — ABO/RH: ABO/RH(D): O POS

## 2019-08-30 LAB — LACTATE DEHYDROGENASE
LDH: 548 U/L — ABNORMAL HIGH (ref 98–192)
LDH: 584 U/L — ABNORMAL HIGH (ref 98–192)

## 2019-08-30 MED ORDER — FUROSEMIDE 40 MG PO TABS
40.0000 mg | ORAL_TABLET | Freq: Every day | ORAL | Status: DC
  Administered 2019-08-31 – 2019-09-01 (×2): 40 mg via ORAL
  Filled 2019-08-30 (×2): qty 1

## 2019-08-30 MED ORDER — HYDRALAZINE HCL 25 MG PO TABS: 25.0000 mg | ORAL_TABLET | Freq: Four times a day (QID) | ORAL | Status: DC | PRN

## 2019-08-30 MED ORDER — METHYLPREDNISOLONE SODIUM SUCC 125 MG IJ SOLR
60.0000 mg | Freq: Once | INTRAMUSCULAR | Status: AC | PRN
  Administered 2019-08-31: 60 mg via INTRAVENOUS
  Filled 2019-08-30: qty 2

## 2019-08-30 MED ORDER — DEXAMETHASONE 6 MG PO TABS
20.0000 mg | ORAL_TABLET | Freq: Every day | ORAL | Status: DC
  Administered 2019-08-31 – 2019-09-01 (×3): 20 mg via ORAL
  Filled 2019-08-30: qty 1
  Filled 2019-08-30: qty 5
  Filled 2019-08-30: qty 1

## 2019-08-30 MED ORDER — ONDANSETRON HCL 4 MG/2ML IJ SOLN
4.0000 mg | Freq: Once | INTRAMUSCULAR | Status: DC | PRN
  Administered 2019-09-01: 4 mg via INTRAVENOUS
  Filled 2019-08-30: qty 2

## 2019-08-30 MED ORDER — SERTRALINE HCL 50 MG PO TABS
25.0000 mg | ORAL_TABLET | Freq: Every day | ORAL | Status: DC
  Administered 2019-08-31 – 2019-09-01 (×2): 25 mg via ORAL
  Filled 2019-08-30 (×2): qty 1

## 2019-08-30 MED ORDER — AMLODIPINE BESYLATE 5 MG PO TABS
5.0000 mg | ORAL_TABLET | Freq: Every day | ORAL | Status: DC
  Administered 2019-08-30 – 2019-09-01 (×3): 5 mg via ORAL
  Filled 2019-08-30 (×3): qty 1

## 2019-08-30 MED ORDER — IMMUNE GLOBULIN (HUMAN) 10 GM/100ML IV SOLN
1.0000 g/kg | INTRAVENOUS | Status: AC
  Administered 2019-08-31 – 2019-09-01 (×2): 50 g via INTRAVENOUS
  Filled 2019-08-30: qty 500
  Filled 2019-08-30 (×2): qty 100
  Filled 2019-08-30: qty 500

## 2019-08-30 MED ORDER — DORZOLAMIDE HCL-TIMOLOL MAL 2-0.5 % OP SOLN
1.0000 [drp] | Freq: Two times a day (BID) | OPHTHALMIC | Status: DC
  Administered 2019-08-31 – 2019-09-01 (×2): 1 [drp] via OPHTHALMIC
  Filled 2019-08-30: qty 10

## 2019-08-30 MED ORDER — LATANOPROST 0.005 % OP SOLN
1.0000 [drp] | Freq: Every day | OPHTHALMIC | Status: DC
  Administered 2019-08-30 – 2019-08-31 (×2): 1 [drp] via OPHTHALMIC
  Filled 2019-08-30: qty 2.5

## 2019-08-30 MED ORDER — ATORVASTATIN CALCIUM 40 MG PO TABS
40.0000 mg | ORAL_TABLET | Freq: Every day | ORAL | Status: DC
  Administered 2019-08-31: 40 mg via ORAL
  Filled 2019-08-30: qty 1

## 2019-08-30 MED ORDER — ACETAMINOPHEN 325 MG PO TABS
650.0000 mg | ORAL_TABLET | Freq: Once | ORAL | Status: DC | PRN
  Administered 2019-09-01: 650 mg via ORAL
  Filled 2019-08-30: qty 2

## 2019-08-30 MED ORDER — BRIMONIDINE TARTRATE 0.15 % OP SOLN: 1.0000 [drp] | Freq: Three times a day (TID) | OPHTHALMIC | Status: DC

## 2019-08-30 MED ORDER — SODIUM CHLORIDE 0.9 % IV SOLN: 10.0000 mL/h | Freq: Once | INTRAVENOUS | Status: DC

## 2019-08-30 NOTE — Telephone Encounter (Signed)
CRITICAL VALUE STICKER  CRITICAL VALUE: Plt < 5 (potential clumping)  DATE & TIME NOTIFIED: 08/30/2019, [id number]:[id number]  MESSENGER (representative from lab): Pam  MD NOTIFIED: Dr. Irene Limbo   TIME OF NOTIFICATION:1003  RESPONSE: MD Olene Craven

## 2019-08-30 NOTE — ED Triage Notes (Signed)
Pt sent by her oncologist for low platelets. Pt denies bleeding, no complaints at this time. NAD in triage

## 2019-08-30 NOTE — ED Notes (Signed)
Blood bank called regarding platelets needing to be irradiated. Spoke with Bodenhiemer and was advised to have platelets irradiated. Updated blood bank.

## 2019-08-30 NOTE — ED Notes (Signed)
Date and time results received: 08/30/19  (use smartphrase ".now" to insert current time)  Test: WBC (84.3), PLT (6) Critical Value: WBC (84.3), PLT (6)   Name of Provider Notified: Carlota Raspberry, PA  Orders Received? Or Actions Taken?: Actions Taken: pa at bedside

## 2019-08-30 NOTE — Telephone Encounter (Deleted)
Per 12/163 los Additional labs today F/u based on lab results.

## 2019-08-30 NOTE — H&P (Signed)
History and Physical    [name] [id number] DOB: Dec 10, 1940 DOA: 08/30/2019  PCP: [name], MD   Patient coming from: Home  I have personally briefly reviewed patient's old medical records in [place]  Chief Complaint:   HPI: Selena Branch is a 78 y.o. 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  Patient has been on her usual health condition untill about a week ago, she started to notice some rash on her legs, but she denied any easy bruising or bleeding, no fever chills no cough no short of breath no chest pains, no joint pain no weight loss.  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  ED Course: CBC showed her platelet 6000, and her white count significant increased compared to 3 months ago  Review of Systems: As per HPI otherwise 10 point review of systems negative.    Past Medical History:  Diagnosis Date  . Anxiety   . CLL (chronic lymphocytic leukemia) ([place])   . Glaucoma   . Hyperlipemia   . Hypertension   . Osteoporosis     History reviewed. No pertinent surgical history.   reports that she has never smoked. She has never used smokeless tobacco. She reports that she does not drink alcohol or use drugs.  Allergies  Allergen Reactions  . Codeine Nausea Only    Family History  Problem Relation Age of Onset  . Stroke Mother   . Lung cancer Father      Prior to Admission medications   Medication Sig Start Date End Date Taking? Authorizing Provider  amLODipine (NORVASC) 5 MG tablet  11/11/15   [provider]  atorvastatin (LIPITOR) 40 MG tablet Take by mouth.    [provider]  brimonidine (ALPHAGAN) 0.15 % ophthalmic solution  10/13/15   [provider]  Cholecalciferol (VITAMIN D3) 2000 units TABS Take by mouth.    [provider]  dorzolamide-timolol (COSOPT) 22.3-6.8 MG/ML ophthalmic solution  12/10/15   [provider]  fenoprofen (NALFON) 600 MG TABS tablet Take 600 mg by mouth.    [provider]  furosemide (LASIX) 40 MG tablet Take by mouth.    [provider]  ibandronate (BONIVA) 150 MG tablet  12/10/15   [provider]  latanoprost (XALATAN) 0.005 % ophthalmic solution  12/10/15   [provider]  Multiple Vitamin (MULTIVITAMIN) capsule Take 1 capsule by mouth daily.    [provider]  quinapril (ACCUPRIL) 40 MG tablet Take by mouth.    [provider]  sertraline (ZOLOFT) 25 MG tablet  11/28/15   [provider]    Physical Exam: Vitals:   08/30/19 1548 08/30/19 1734  BP: (!) 177/74 (!) 172/70  Pulse: (!) 103 90  Resp: 16 20  Temp: 98.8 F (37.1 C)   TempSrc: Oral   SpO2: 99% 98%    Constitutional: NAD, calm, comfortable Vitals:   08/30/19 1548 08/30/19 1734  BP: (!) 177/74 (!) 172/70  Pulse: (!) 103 90  Resp: 16 20  Temp: 98.8 F (37.1 C)   TempSrc: Oral   SpO2: 99% 98%   Eyes: PERRL, lids and conjunctivae normal ENMT: Mucous membranes are moist. Posterior pharynx clear of any exudate or lesions.Normal dentition.  Neck:  normal, supple, no masses, no thyromegaly Respiratory: clear to auscultation bilaterally, no wheezing, no crackles. Normal respiratory effort. No accessory muscle use.  Cardiovascular: Regular rate and rhythm, no murmurs / rubs / gallops. No extremity edema. 2+ pedal pulses. No carotid bruits.  Abdomen: no tenderness, no masses palpated. No hepatosplenomegaly. Bowel sounds positive.  Musculoskeletal: no clubbing / cyanosis. No joint deformity upper and lower extremities. Good ROM, no contractures. Normal muscle tone.  Skin: Macular rash noticed on bilateral lower extremities, petechial-like,  nontender. Neurologic: CN 2-12 grossly intact. Sensation intact, DTR normal. Strength 5/5 in all 4.  Psychiatric: Normal judgment and insight. Alert and oriented x 3. Normal mood.     Labs on Admission: I have personally reviewed following labs and imaging studies  CBC: Recent Labs  Lab 08/30/19 0845 08/30/19 1611  WBC 77.6* 84.3*  NEUTROABS 3.9 3.6  HGB 11.4* 11.7*  HCT 35.9* 36.8  MCV 97.1 97.4  PLT <5* 6*   Basic Metabolic Panel: Recent Labs  Lab 08/30/19 0845 08/30/19 1611  NA 144 139  K 4.5 4.7  CL 110 109  CO2 25 20*  GLUCOSE 100* 144*  BUN 23 25*  CREATININE 0.82 0.91  CALCIUM 9.3 9.8   GFR: Estimated Creatinine Clearance: 33.3 mL/min (by C-G formula based on SCr of 0.91 mg/dL). Liver Function Tests: Recent Labs  Lab 08/30/19 0845  AST 32  ALT 18  ALKPHOS 95  BILITOT 0.7  PROT 6.3*  ALBUMIN 3.9   No results for input(s): LIPASE, AMYLASE in the last 168 hours. No results for input(s): AMMONIA in the last 168 hours. Coagulation Profile: No results for input(s): INR, PROTIME in the last 168 hours. Cardiac Enzymes: No results for input(s): CKTOTAL, CKMB, CKMBINDEX, TROPONINI in the last 168 hours. BNP (last 3 results) No results for input(s): PROBNP in the last 8760 hours. HbA1C: No results for input(s): HGBA1C in the last 72 hours. CBG: No results for input(s): GLUCAP in the last 168 hours. Lipid Profile: No results for input(s): CHOL, HDL, LDLCALC, TRIG, CHOLHDL, LDLDIRECT in the last 72 hours. Thyroid Function Tests: No results for input(s): TSH, T4TOTAL, FREET4, T3FREE, THYROIDAB in the last 72 hours. Anemia Panel: Recent Labs    08/30/19 1034  RETICCTPCT 2.3   Urine analysis:    Component Value Date/Time   COLORURINE YELLOW 05/06/2010 1611   APPEARANCEUR CLEAR 05/06/2010 1611   LABSPEC 1.026 05/06/2010 1611   PHURINE 5.0 05/06/2010 1611   GLUCOSEU NEGATIVE 05/06/2010 1611   HGBUR NEGATIVE 05/06/2010 1611   BILIRUBINUR SMALL (A)  05/06/2010 1611   KETONESUR 15 (A) 05/06/2010 1611   PROTEINUR NEGATIVE 05/06/2010 1611   UROBILINOGEN 0.2 05/06/2010 1611   NITRITE NEGATIVE 05/06/2010 1611   LEUKOCYTESUR  05/06/2010 1611    NEGATIVE MICROSCOPIC NOT DONE ON URINES WITH NEGATIVE PROTEIN, BLOOD, LEUKOCYTES, NITRITE, OR GLUCOSE <1000 mg/dL.    Radiological Exams on Admission: No results found.  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  As of now, there is no symptoms or signs of active infection.  Anemia, new onset compared to 3 months ago, probably related to bone marrow suppression with a possible CLL transformation?  Poorly controlled hypertension, continue home meds, as needed hydralazine.  Hyperlipidemia, continue home  meds.  Glucoma, on eyedrops.    DVT prophylaxis: Hold chemical DVT prophylaxis for thrombocytopenia. Code Status: Full code Family Communication: None at bedside  disposition Plan: Home Consults called: Oncologist was contacted by ED physician Admission status: MedSurg Obs   Lequita Halt MD [facility] Pager [phone]  If 7PM-7AM, please contact night-coverage [email or web address] Password [facility]  08/30/2019, 6:40 PM

## 2019-08-30 NOTE — ED Provider Notes (Signed)
[facility] EMERGENCY DEPARTMENT Provider Note   CSN: [id number] Arrival date & time: 08/30/19  1535     History Chief Complaint  Patient presents with  . Abnormal Lab    Selena Branch is a 78 y.o. female with PMH/o CLL, HLD, HTn who presents for evaluation of low platelets.  Patient had blood work done today and was called by her oncology team and notified that she had low platelets.  She was instructed to go emergency department for further evaluation.  She currently has CLL but she has not had any treatment.  She states that she has not noted any bleeding.  She denies any fevers, chest pain with breathing, abdominal pain, nausea/vomiting comfortably.  The history is provided by the patient.       Past Medical History:  Diagnosis Date  . Anxiety   . CLL (chronic lymphocytic leukemia) ([place])   . Glaucoma   . Hyperlipemia   . Hypertension   . Osteoporosis     Patient Active Problem List   Diagnosis Date Noted  . Thrombocytopenia ([place]) 08/30/2019  . Anemia   . CLL (chronic lymphocytic leukemia) ([place])     History reviewed. No pertinent surgical history.   OB History   No obstetric history on file.     Family History  Problem Relation Age of Onset  . Stroke Mother   . Lung cancer Father     Social History   Tobacco Use  . Smoking status: Never Smoker  . Smokeless tobacco: Never Used  Substance Use Topics  . Alcohol use: No    Alcohol/week: 0.0 standard drinks  . Drug use: No    Home Medications Prior to Admission medications   Medication Sig Start Date End Date Taking? Authorizing Provider  amLODipine (NORVASC) 5 MG tablet Take 5 mg by mouth at bedtime.  11/11/15  Yes [provider]  atorvastatin (LIPITOR) 40 MG tablet Take 40 mg by mouth daily.    Yes [provider]  Cholecalciferol (VITAMIN D3) 2000 units TABS Take 2,000 Units by mouth daily.    Yes [provider]  dorzolamide-timolol (COSOPT) 22.3-6.8  MG/ML ophthalmic solution Place 1 drop into both eyes daily.  12/10/15  Yes [provider]  furosemide (LASIX) 20 MG tablet Take 20 mg by mouth daily.    Yes [provider]  latanoprost (XALATAN) 0.005 % ophthalmic solution Place 1 drop into both eyes at bedtime.  12/10/15  Yes [provider]  Multiple Vitamin (MULTIVITAMIN) capsule Take 1 capsule by mouth daily.   Yes [provider]  quinapril (ACCUPRIL) 40 MG tablet Take 40 mg by mouth daily.    Yes [provider]  sertraline (ZOLOFT) 25 MG tablet Take 25 mg by mouth daily.  11/28/15  Yes [provider]  SIMBRINZA 1-0.2 % SUSP Place 1 drop into both eyes 2 (two) times daily. 08/17/19  Yes [provider]    Allergies    Codeine  Review of Systems   Review of Systems  Constitutional: Negative for fever.  Respiratory: Negative for cough and shortness of breath.   Cardiovascular: Negative for chest pain.  Gastrointestinal: Negative for abdominal pain, nausea and vomiting.  Genitourinary: Negative for dysuria and hematuria.  Neurological: Negative for headaches.  All other systems reviewed and are negative.   Physical Exam Updated Vital Signs BP (!) 160/55   Pulse 99   Temp 98.8 F (37.1 C) (Oral)   Resp 18   SpO2  98%   Physical Exam Vitals and nursing note reviewed.  Constitutional:      Appearance: Normal appearance. She is well-developed.  HENT:     Head: Normocephalic and atraumatic.  Eyes:     General: Lids are normal.     Conjunctiva/sclera: Conjunctivae normal.     Pupils: Pupils are equal, round, and reactive to light.  Cardiovascular:     Rate and Rhythm: Normal rate and regular rhythm.     Pulses: Normal pulses.     Heart sounds: Normal heart sounds. No murmur. No friction rub. No gallop.   Pulmonary:     Effort: Pulmonary effort is normal.     Breath sounds: Normal breath sounds.     Comments: Lungs clear to auscultation bilaterally.  Symmetric  chest rise.  No wheezing, rales, rhonchi. Abdominal:     Palpations: Abdomen is soft. Abdomen is not rigid.     Tenderness: There is no abdominal tenderness. There is no guarding.     Comments: Abdomen is soft, non-distended, non-tender. No rigidity, No guarding. No peritoneal signs.  Musculoskeletal:        General: Normal range of motion.     Cervical back: Full passive range of motion without pain.  Skin:    General: Skin is warm and dry.     Capillary Refill: Capillary refill takes less than 2 seconds.  Neurological:     Mental Status: She is alert and oriented to person, place, and time.  Psychiatric:        Speech: Speech normal.     ED Results / Procedures / Treatments   Labs (all labs ordered are listed, but only abnormal results are displayed) Labs Reviewed  CBC WITH DIFFERENTIAL/PLATELET - Abnormal; Notable for the following components:      Result Value   WBC 84.3 (*)    RBC 3.78 (*)    Hemoglobin 11.7 (*)    Platelets 6 (*)    Lymphs Abs 67.3 (*)    Monocytes Absolute 12.3 (*)    Eosinophils Absolute 0.7 (*)    Basophils Absolute 0.3 (*)    Abs Immature Granulocytes 0.13 (*)    All other components within normal limits  BASIC METABOLIC PANEL - Abnormal; Notable for the following components:   CO2 20 (*)    Glucose, Bld 144 (*)    BUN 25 (*)    All other components within normal limits  SARS CORONAVIRUS 2 (TAT 6-24 HRS)  CBC  CBC WITH DIFFERENTIAL/PLATELET  IMMATURE PLATELET FRACTION  COMPREHENSIVE METABOLIC PANEL  PREPARE PLATELET PHERESIS  TYPE AND SCREEN  ABO/RH    EKG None  Radiology No results found.  Procedures Procedures (including critical care time)  Medications Ordered in ED Medications  0.9 %  sodium chloride infusion (has no administration in time range)  amLODipine (NORVASC) tablet 5 mg (5 mg Oral Given 08/30/19 2135)  atorvastatin (LIPITOR) tablet 40 mg (has no administration in time range)  furosemide (LASIX) tablet 40 mg (has  no administration in time range)  sertraline (ZOLOFT) tablet 25 mg (25 mg Oral Not Given 08/30/19 2029)  dorzolamide-timolol (COSOPT) 22.3-6.8 MG/ML ophthalmic solution 1 drop (has no administration in time range)  latanoprost (XALATAN) 0.005 % ophthalmic solution 1 drop (has no administration in time range)  hydrALAZINE (APRESOLINE) tablet 25 mg (has no administration in time range)  dexamethasone (DECADRON) tablet 20 mg (has no administration in time range)  Immune Globulin 10% (PRIVIGEN) IV infusion 50 g (has no administration in  time range)  acetaminophen (TYLENOL) tablet 650 mg (has no administration in time range)  ondansetron (ZOFRAN) injection 4 mg (has no administration in time range)  methylPREDNISolone sodium succinate (SOLU-MEDROL) 125 mg/2 mL injection 60 mg (has no administration in time range)    ED Course  I have reviewed the triage vital signs and the nursing notes.  Pertinent labs & imaging results that were available during my care of the patient were reviewed by me and considered in my medical decision making (see chart for details).    MDM Rules/Calculators/A&P                      78 year old female with past medical story CLL who presents for evaluation of low platelets.  Initially arrival, she is afebrile, nontoxic-appearing.  She is slightly tachycardic.  Vitals otherwise stable.  Plan check labs.  CBC shows leukocytosis of 84.3.  Hemoglobin 11.7.  Platelets are 6.  BMP shows BUN of 25, creatinine of 0.91.  Discussed patient with Dr. Irene Limbo (Oncology).  He would like patient admitted for ITP.  Plan for platelets, IVIG, steroids.  Patient can have Covid test.  We will plan to admit to medicine.  Discussed patient with hospitalist who accepts patient for admission.   Portions of this note were generated with Lobbyist. Dictation errors may occur despite best attempts at proofreading.  Final Clinical Impression(s) / ED Diagnoses Final diagnoses:   Idiopathic thrombocytopenia [facility])    Rx / DC Orders ED Discharge Orders    None       Desma Mcgregor 08/30/19 2233    Ezequiel Essex, MD 08/30/19 2306

## 2019-08-30 NOTE — ED Notes (Signed)
Verbal order for labs from Dr. Ronnald Nian.

## 2019-08-30 NOTE — Progress Notes (Signed)
[facility]   HEMATOLOGY/ONCOLOGY INPATIENT PROGRESS NOTE  Date of Service: 08/30/2019  Inpatient Attending: .Lequita Halt, MD   SUBJECTIVE  I was consulted by ED physician to help mx of CLL and associated severe thrombocytopenia and mild anemia. Patient was seen in clinic and was referred to ED for severe thrombocytopenia. She notes extensive petechiae on b/l lower extremities but other overt bleeding.   OBJECTIVE:  NAD  PHYSICAL EXAMINATION: . Vitals:   08/30/19 2000 08/30/19 2015 08/30/19 2134 08/30/19 2135  BP: (!) 168/61 113/72 (!) 160/55 (!) 160/55  Pulse: 86 (!) 58 99   Resp: 16 (!) 23 18   Temp:      TempSrc:      SpO2: 97% 97% 98%    GENERAL:alert, in no acute distress and comfortable SKIN: b/l lower extremity petechiae OROPHARYNX:no exudate, no erythema , no gum bleeding.  NECK: supple, no JVD, thyroid normal size, non-tender, without nodularity LYMPH:  no palpable lymphadenopathy in the cervical, axillary or inguinal LUNGS: clear to auscultation with normal respiratory effort HEART: regular rate & rhythm,  no murmurs and no lower extremity edema ABDOMEN: abdomen soft, non-tender, no significant palpable splenomegaly. Musculoskeletal: no cyanosis of digits and no clubbing  PSYCH: alert & oriented x 3 with fluent speech NEURO: no focal motor/sensory deficits  MEDICAL HISTORY:  Past Medical History:  Diagnosis Date  . Anxiety   . CLL (chronic lymphocytic leukemia) ([place])   . Glaucoma   . Hyperlipemia   . Hypertension   . Osteoporosis     SURGICAL HISTORY: History reviewed. No pertinent surgical history.  SOCIAL HISTORY: Social History   Socioeconomic History  . Marital status: Married    Spouse name: Not on file  . Number of children: 1  . Years of education: HS  . Highest education level: Not on file  Occupational History  . Occupation: Retired  Tobacco Use  . Smoking status: Never Smoker  . Smokeless tobacco: Never Used  Substance and Sexual  Activity  . Alcohol use: No    Alcohol/week: 0.0 standard drinks  . Drug use: No  . Sexual activity: Not on file  Other Topics Concern  . Not on file  Social History Narrative   Drinks 2 cups of coffee   Social Determinants of Health   Financial Resource Strain:   . Difficulty of Paying Living Expenses: Not on file  Food Insecurity:   . Worried About Charity fundraiser in the Last Year: Not on file  . Ran Out of Food in the Last Year: Not on file  Transportation Needs:   . Lack of Transportation (Medical): Not on file  . Lack of Transportation (Non-Medical): Not on file  Physical Activity:   . Days of Exercise per Week: Not on file  . Minutes of Exercise per Session: Not on file  Stress:   . Feeling of Stress : Not on file  Social Connections:   . Frequency of Communication with Friends and Family: Not on file  . Frequency of Social Gatherings with Friends and Family: Not on file  . Attends Religious Services: Not on file  . Active Member of Clubs or Organizations: Not on file  . Attends Archivist Meetings: Not on file  . Marital Status: Not on file  Intimate Partner Violence:   . Fear of Current or Ex-Partner: Not on file  . Emotionally Abused: Not on file  . Physically Abused: Not on file  . Sexually Abused: Not on file  FAMILY HISTORY: Family History  Problem Relation Age of Onset  . Stroke Mother   . Lung cancer Father     ALLERGIES:  is allergic to codeine.  MEDICATIONS:  Scheduled Meds: . amLODipine  5 mg Oral Daily  . [START ON 08/31/2019] atorvastatin  40 mg Oral q1800  . dexamethasone  20 mg Oral Daily  . dorzolamide-timolol  1 drop Both Eyes BID  . [START ON 08/31/2019] furosemide  40 mg Oral Daily  . latanoprost  1 drop Both Eyes QHS  . sertraline  25 mg Oral Daily   Continuous Infusions: . sodium chloride    . Immune Globulin 10%     PRN Meds:.acetaminophen, hydrALAZINE, methylPREDNISolone (SOLU-MEDROL) injection, ondansetron  (ZOFRAN) IV  REVIEW OF SYSTEMS:    10 Point review of Systems was done is negative except as noted above.   LABORATORY DATA:  I have reviewed the data as listed  . CBC Latest Ref Rng & Units 08/30/2019 08/30/2019 05/31/2019  WBC 4.0 - 10.5 K/uL 84.3(HH) 77.6(HH) 39.1(H)  Hemoglobin 12.0 - 15.0 g/dL 11.7(L) 11.4(L) 13.7  Hematocrit 36.0 - 46.0 % 36.8 35.9(L) 40.7  Platelets 150 - 400 K/uL 6(LL) <5(LL) 176    . CMP Latest Ref Rng & Units 08/30/2019 08/30/2019 05/31/2019  Glucose 70 - 99 mg/dL 144(H) 100(H) 99  BUN 8 - 23 mg/dL 25(H) 23 27(H)  Creatinine 0.44 - 1.00 mg/dL 0.91 0.82 0.85  Sodium 135 - 145 mmol/L 139 144 142  Potassium 3.5 - 5.1 mmol/L 4.7 4.5 4.5  Chloride 98 - 111 mmol/L 109 110 110  CO2 22 - 32 mmol/L 20(L) 25 25  Calcium 8.9 - 10.3 mg/dL 9.8 9.3 9.3  Total Protein 6.5 - 8.1 g/dL - 6.3(L) 6.4(L)  Total Bilirubin 0.3 - 1.2 mg/dL - 0.7 0.5  Alkaline Phos 38 - 126 U/L - 95 64  AST 15 - 41 U/L - 32 25  ALT 0 - 44 U/L - 18 17   Component     Latest Ref Rng & Units 08/30/2019  Retic Ct Pct     0.4 - 3.1 % 2.3  RBC.     3.87 - 5.11 MIL/uL 4.03  Retic Count, Absolute     19.0 - 186.0 K/uL 91.5  Immature Retic Fract     2.3 - 15.9 % 23.9 (H)  DAT, complement      NEG  DAT, IgG      POS . . .  LDH     98 - 192 U/L 584 (H)    RADIOGRAPHIC STUDIES: I have personally reviewed the radiological images as listed and agreed with the findings in the report. No results found.  ASSESSMENT & PLAN:   #1 CLL Previous CLL prognostic FISH panel without detectable mutations. Has been Rai stage 0.. Now with new anemia and severe thrombocytopenia. WBC counts have increased significantly to 84k from 39k 3 months ago.  Concerning for change in tempo of her disease  No overt palpable LNadenopathy or hepatosplenomegaly  #2 severe thrombocytopenia with platelet counts around 5k likely related to CLL.  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   However cannot rule out bone marrow involvement as an etiology of thrombocytopenia as well.  #3 new mild normocytic anemia.  Hemoglobin 11.7 with an MCV of 97.4. Likely related to her CLL. Elevated LDH level in the 500s would need to rule out autoimmune hemolysis. Haptoglobin pending, normal bilirubin Coombs test positive for IgG suggesting  could be an element of autoimmune hemolysis.  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 -Would recommend transfusion of 1 unit of platelets with a 30-minute and 60-minute post count to determine if there is a bump in platelets or rapid removal of platelets like with ITP. -transfuse platelets to maintain PLT count of >10kor if bleeding. -CT chest abdomen pelvis for evaluation of her CLL.-- ordered for tommorrow -We will likely need bone marrow biopsy next week. -all of patient questions answered. -admitted to hospitalist service. -discussed plan of care with ED physician. -will continue to f/u daily  I spent 25 minutes counseling the patient face to face. The total time spent in the appointment was 40 minutes and more than 50% was on counseling and direct patient cares.and co-ordination of cares    GSullivan LoneMD MPimaAAHIVMS SMercy Medical CenterCSilver Cross Hospital And Medical CentersHematology/Oncology Physician CWest [facility] (Office):       [phone](Work cell):  [phone](Fax):           [phone] 08/30/2019 9:55 PM

## 2019-08-30 NOTE — Telephone Encounter (Signed)
Per 12/17 los additional labs today.  F/u based on lab results

## 2019-08-30 NOTE — Telephone Encounter (Signed)
Per Dr. Irene Limbo directions: Contacted patient and advised her to go to ED now. Her platelets are less than 5. Dr. Irene Limbo will call her to discuss, but she should go to ED as soon as possible. She verbalized understanding.

## 2019-08-30 NOTE — Telephone Encounter (Signed)
Provider was Irene Limbo, put wrong provider on accident.

## 2019-08-31 ENCOUNTER — Observation Stay (HOSPITAL_COMMUNITY): Payer: PPO

## 2019-08-31 DIAGNOSIS — M810 Age-related osteoporosis without current pathological fracture: Secondary | ICD-10-CM | POA: Diagnosis present

## 2019-08-31 DIAGNOSIS — Z20828 Contact with and (suspected) exposure to other viral communicable diseases: Secondary | ICD-10-CM | POA: Diagnosis present

## 2019-08-31 DIAGNOSIS — Z7983 Long term (current) use of bisphosphonates: Secondary | ICD-10-CM | POA: Diagnosis not present

## 2019-08-31 DIAGNOSIS — R7402 Elevation of levels of lactic acid dehydrogenase (LDH): Secondary | ICD-10-CM | POA: Diagnosis present

## 2019-08-31 DIAGNOSIS — F419 Anxiety disorder, unspecified: Secondary | ICD-10-CM | POA: Diagnosis present

## 2019-08-31 DIAGNOSIS — D693 Immune thrombocytopenic purpura: Secondary | ICD-10-CM | POA: Diagnosis present

## 2019-08-31 DIAGNOSIS — Z801 Family history of malignant neoplasm of trachea, bronchus and lung: Secondary | ICD-10-CM | POA: Diagnosis not present

## 2019-08-31 DIAGNOSIS — H409 Unspecified glaucoma: Secondary | ICD-10-CM | POA: Diagnosis present

## 2019-08-31 DIAGNOSIS — D630 Anemia in neoplastic disease: Secondary | ICD-10-CM | POA: Diagnosis present

## 2019-08-31 DIAGNOSIS — I10 Essential (primary) hypertension: Secondary | ICD-10-CM | POA: Diagnosis present

## 2019-08-31 DIAGNOSIS — R609 Edema, unspecified: Secondary | ICD-10-CM

## 2019-08-31 DIAGNOSIS — C9110 Chronic lymphocytic leukemia of B-cell type not having achieved remission: Secondary | ICD-10-CM | POA: Diagnosis present

## 2019-08-31 DIAGNOSIS — D696 Thrombocytopenia, unspecified: Secondary | ICD-10-CM | POA: Diagnosis present

## 2019-08-31 DIAGNOSIS — Z79899 Other long term (current) drug therapy: Secondary | ICD-10-CM | POA: Diagnosis not present

## 2019-08-31 DIAGNOSIS — E785 Hyperlipidemia, unspecified: Secondary | ICD-10-CM | POA: Diagnosis present

## 2019-08-31 DIAGNOSIS — M7989 Other specified soft tissue disorders: Secondary | ICD-10-CM | POA: Diagnosis present

## 2019-08-31 DIAGNOSIS — Z823 Family history of stroke: Secondary | ICD-10-CM | POA: Diagnosis not present

## 2019-08-31 LAB — CBC WITH DIFFERENTIAL/PLATELET
Abs Immature Granulocytes: 0.18 10*3/uL — ABNORMAL HIGH (ref 0.00–0.07)
Abs Immature Granulocytes: 0 10*3/uL (ref 0.00–0.07)
Basophils Absolute: 0.3 10*3/uL — ABNORMAL HIGH (ref 0.0–0.1)
Basophils Absolute: 0 10*3/uL (ref 0.0–0.1)
Basophils Relative: 0 %
Basophils Relative: 0 %
Eosinophils Absolute: 0.6 10*3/uL — ABNORMAL HIGH (ref 0.0–0.5)
Eosinophils Absolute: 0 10*3/uL (ref 0.0–0.5)
Eosinophils Relative: 1 %
Eosinophils Relative: 0 %
HCT: 34.4 % — ABNORMAL LOW (ref 36.0–46.0)
HCT: 32.4 % — ABNORMAL LOW (ref 36.0–46.0)
Hemoglobin: 11 g/dL — ABNORMAL LOW (ref 12.0–15.0)
Hemoglobin: 10.6 g/dL — ABNORMAL LOW (ref 12.0–15.0)
Immature Granulocytes: 0 %
Lymphocytes Relative: 77 %
Lymphocytes Relative: 86 %
Lymphs Abs: 62.1 10*3/uL — ABNORMAL HIGH (ref 0.7–4.0)
Lymphs Abs: 59.6 10*3/uL — ABNORMAL HIGH (ref 0.7–4.0)
MCH: 30.4 pg (ref 26.0–34.0)
MCH: 30.5 pg (ref 26.0–34.0)
MCHC: 32 g/dL (ref 30.0–36.0)
MCHC: 32.7 g/dL (ref 30.0–36.0)
MCV: 95 fL (ref 80.0–100.0)
MCV: 93.1 fL (ref 80.0–100.0)
Monocytes Absolute: 12.8 10*3/uL — ABNORMAL HIGH (ref 0.1–1.0)
Monocytes Absolute: 0.7 10*3/uL (ref 0.1–1.0)
Monocytes Relative: 16 %
Monocytes Relative: 1 %
Neutro Abs: 5.2 10*3/uL (ref 1.7–7.7)
Neutro Abs: 9 10*3/uL — ABNORMAL HIGH (ref 1.7–7.7)
Neutrophils Relative %: 6 %
Neutrophils Relative %: 13 %
Platelets: 24 10*3/uL — CL (ref 150–400)
Platelets: 30 10*3/uL — ABNORMAL LOW (ref 150–400)
RBC: 3.62 MIL/uL — ABNORMAL LOW (ref 3.87–5.11)
RBC: 3.48 MIL/uL — ABNORMAL LOW (ref 3.87–5.11)
RDW: 13.9 % (ref 11.5–15.5)
RDW: 13.8 % (ref 11.5–15.5)
WBC: 81.1 10*3/uL (ref 4.0–10.5)
WBC: 69.3 10*3/uL (ref 4.0–10.5)
nRBC: 0 % (ref 0.0–0.2)
nRBC: 0.2 % (ref 0.0–0.2)

## 2019-08-31 LAB — HAPTOGLOBIN: Haptoglobin: <10 mg/dL — ABNORMAL LOW (ref 42–346)

## 2019-08-31 LAB — COMPREHENSIVE METABOLIC PANEL
ALT: 22 U/L (ref 0–44)
AST: 41 U/L (ref 15–41)
Albumin: 3.8 g/dL (ref 3.5–5.0)
Alkaline Phosphatase: 85 U/L (ref 38–126)
Anion gap: 10 (ref 5–15)
BUN: 21 mg/dL (ref 8–23)
CO2: 21 mmol/L — ABNORMAL LOW (ref 22–32)
Calcium: 9.2 mg/dL (ref 8.9–10.3)
Chloride: 108 mmol/L (ref 98–111)
Creatinine, Ser: 0.77 mg/dL (ref 0.44–1.00)
GFR calc Af Amer: >60 mL/min (ref >60)
GFR calc non Af Amer: >60 mL/min (ref >60)
Glucose, Bld: 132 mg/dL — ABNORMAL HIGH (ref 70–99)
Potassium: 4.5 mmol/L (ref 3.5–5.1)
Sodium: 139 mmol/L (ref 135–145)
Total Bilirubin: 1 mg/dL (ref 0.3–1.2)
Total Protein: 6.6 g/dL (ref 6.5–8.1)

## 2019-08-31 LAB — BPAM PLATELET PHERESIS
Blood Product Expiration Date: 202101092359
ISSUE DATE / TIME: 202012170359
Unit Type and Rh: 5100

## 2019-08-31 LAB — IMMATURE PLATELET FRACTION: Immature Platelet Fraction: 9.2 % — ABNORMAL HIGH (ref 1.2–8.6)

## 2019-08-31 LAB — PREPARE PLATELET PHERESIS: Unit division: 0

## 2019-08-31 MED ORDER — DIPHENHYDRAMINE HCL 50 MG/ML IJ SOLN
25.0000 mg | Freq: Once | INTRAMUSCULAR | Status: AC
  Administered 2019-08-31: 25 mg via INTRAVENOUS
  Filled 2019-08-31: qty 1

## 2019-08-31 MED ORDER — METHYLPREDNISOLONE SODIUM SUCC 125 MG IJ SOLR: 125.0000 mg | Freq: Once | INTRAMUSCULAR | Status: DC

## 2019-08-31 MED ORDER — IOHEXOL 300 MG/ML  SOLN
100.0000 mL | Freq: Once | INTRAMUSCULAR | Status: AC | PRN
  Administered 2019-08-31: 12:00:00 100 mL via INTRAVENOUS

## 2019-08-31 NOTE — ED Notes (Addendum)
Pt c/o new rash to right hand. Platelets paused. Text/page Midlevel, ordered to reduce administration by half and administer benadryl

## 2019-08-31 NOTE — Progress Notes (Signed)
Progress Note    Selena Branch  MIW:[id number] DOB: 09-May-1941  DOA: 08/30/2019 PCP: [name], MD    Brief Narrative:     Medical records reviewed and are as summarized below:  Selena Branch is an 78 y.o. 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  Patient has been on her usual health condition untill about a week ago, she started to notice some rash on her legs, but she denied any easy bruising or bleeding, no fever chills no cough no short of breath no chest pains, no joint pain no weight loss.  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  Assessment/Plan:   Active Problems:   Thrombocytopenia (HCC)    thrombocytopenia -oncology consult appreciated -plan: high dose steroids and IVIG:  -Dexamethasone 86m po daily x 4 doses and then prednisone taper -IVIG 1g/kg q24h x 2 doses  - will likely need bone marrow biopsy next week. Per oncology: -if platelets > 50k on labs this afternoon could hold 2nd dose of IVIG and discharge home to complete 4 days total of Dexamethasone 260mpo daily with breakfast. -we shall setup patient for outpatient labs on 09/05/2019 with platelet transfusion as needed and labs and f/u with me on 09/16/2018. -if platelets <50k would give 2nd dose of IVIG this evening and patient could discharge with same plan with steroids if plt>30k tomorrow AM.   Hyperlipidemia, continue home meds.  Glucoma, on eyedrops.    Family Communication/Anticipated D/C date and plan/Code Status   DVT prophylaxis: scd Code Status: Full Code.  Family Communication: Disposition Plan:    Medical Consultants:    Hematology  Subjective:   Says Dr. KaIrene Limboaid she may go home in the AM if her  counts are better  Objective:    Vitals:   08/31/19 0631 08/31/19 0645 08/31/19 1110 08/31/19 1226  BP: (!) 145/57 (!) 149/54 (!) 166/69 (!) 141/71  Pulse: 98 98 98 100  Resp: 16 15 16 16   Temp: 98.4 F (36.9 C) 98.5 F (36.9 C) 98.3 F (36.8 C) 97.8 F (36.6 C)  TempSrc: Oral Oral Oral Oral  SpO2: 95% 97% 96% 93%  Weight:      Height:        Intake/Output Summary (Last 24 hours) at 08/31/2019 1541 Last data filed at 08/30/2019 2249 Gross per 24 hour  Intake 218 ml  Output --  Net 218 ml   Filed Weights   08/31/19 0300  Weight: 49.4 kg    Exam: In bed, talking on phone rrr No increased work of breathing A+Ox3  Data Reviewed:   I have personally reviewed following labs and imaging studies:  Labs: Labs show the following:   Basic Metabolic Panel: Recent Labs  Lab 08/30/19 0845 08/30/19 1611 08/31/19 0337  NA 144 139 139  K 4.5 4.7 4.5  CL 110 109 108  CO2 25 20* 21*  GLUCOSE 100* 144* 132*  BUN 23 25* 21  CREATININE 0.82 0.91 0.77  CALCIUM 9.3 9.8 9.2   GFR Estimated Creatinine Clearance: 39.9 mL/min (by C-G formula based on SCr of 0.77 mg/dL). Liver Function Tests: Recent Labs  Lab 08/30/19 0845 08/31/19 0337  AST 32 41  ALT 18 22  ALKPHOS 95 85  BILITOT  0.7 1.0  PROT 6.3* 6.6  ALBUMIN 3.9 3.8   No results for input(s): LIPASE, AMYLASE in the last 168 hours. No results for input(s): AMMONIA in the last 168 hours. Coagulation profile No results for input(s): INR, PROTIME in the last 168 hours.  CBC: Recent Labs  Lab 08/30/19 0845 08/30/19 1611 08/31/19 0337  WBC 77.6* 84.3* 81.1*  NEUTROABS 3.9 3.6 5.2  HGB 11.4* 11.7* 11.0*  HCT 35.9* 36.8 34.4*  MCV 97.1 97.4 95.0  PLT <5* 6* 24*   Cardiac Enzymes: No results for input(s): CKTOTAL, CKMB, CKMBINDEX, TROPONINI in the last 168 hours. BNP (last 3 results) No results for input(s): PROBNP in the last 8760 hours. CBG: No results for input(s): GLUCAP in the last 168  hours. D-Dimer: No results for input(s): DDIMER in the last 72 hours. Hgb A1c: No results for input(s): HGBA1C in the last 72 hours. Lipid Profile: No results for input(s): CHOL, HDL, LDLCALC, TRIG, CHOLHDL, LDLDIRECT in the last 72 hours. Thyroid function studies: No results for input(s): TSH, T4TOTAL, T3FREE, THYROIDAB in the last 72 hours.  Invalid input(s): FREET3 Anemia work up: Recent Labs    08/30/19 1034  RETICCTPCT 2.3   Sepsis Labs: Recent Labs  Lab 08/30/19 0845 08/30/19 1611 08/31/19 0337  WBC 77.6* 84.3* 81.1*    Microbiology Recent Results (from the past 240 hour(s))  SARS CORONAVIRUS 2 (TAT 6-24 HRS) Nasopharyngeal Nasopharyngeal Swab     Status: None   Collection Time: 08/30/19  6:01 PM   Specimen: Nasopharyngeal Swab  Result Value Ref Range Status   SARS Coronavirus 2 NEGATIVE NEGATIVE Final    Comment: (NOTE) SARS-CoV-2 target nucleic acids are NOT DETECTED. The SARS-CoV-2 RNA is generally detectable in upper and lower respiratory specimens during the acute phase of infection. Negative results do not preclude SARS-CoV-2 infection, do not rule out co-infections with other pathogens, and should not be used as the sole basis for treatment or other patient management decisions. Negative results must be combined with clinical observations, patient history, and epidemiological information. The expected result is Negative. Fact Sheet for Patients: SugarRoll.be Fact Sheet for Healthcare Providers: https://www.woods-mathews.com/ This test is not yet approved or cleared by the [place] FDA and  has been authorized for detection and/or diagnosis of SARS-CoV-2 by FDA under an Emergency Use Authorization (EUA). This EUA will remain  in effect (meaning this test can be used) for the duration of the COVID-19 declaration under Section 56 4(b)(1) of the Act, 21 U.S.C. section 360bbb-3(b)(1), unless the authorization is  terminated or revoked sooner. Performed at [facility], [place] [address] [place], [place] [address]     Procedures and diagnostic studies:  CT CHEST W CONTRAST  Result Date: 08/31/2019 CLINICAL DATA:  Inpatient. CLL, presenting for staging. New anemia and severe thrombocytopenia. EXAM: CT CHEST, ABDOMEN, AND PELVIS WITH CONTRAST TECHNIQUE: Multidetector CT imaging of the chest, abdomen and pelvis was performed following the standard protocol during bolus administration of intravenous contrast. CONTRAST:  165m OMNIPAQUE IOHEXOL 300 MG/ML  SOLN COMPARISON:  05/07/2010 chest CT angiogram. FINDINGS: CT CHEST FINDINGS Cardiovascular: Normal heart size. No significant pericardial effusion/thickening. Left anterior descending coronary atherosclerosis. Atherosclerotic nonaneurysmal thoracic aorta. Normal caliber pulmonary arteries. No central pulmonary emboli. Mediastinum/Nodes: No discrete thyroid nodules. Unremarkable esophagus. No pathologically enlarged axillary, mediastinal or hilar lymph nodes. Lungs/Pleura: No pneumothorax. No pleural effusion. No acute consolidative airspace disease or lung masses. A few scattered perifissural and subpleural solid pulmonary nodules in both lungs measuring up  to 5 mm in the anterior right lower lobe (series 5/image 75), all stable since 05/07/2010 chest CT, considered benign. No new significant pulmonary nodules. Mild platelike scarring versus atelectasis at the dependent lung bases bilaterally. Musculoskeletal: No aggressive appearing focal osseous lesions. Mild thoracic spondylosis. Chronic mild posterior T10 vertebral compression deformity. CT ABDOMEN PELVIS FINDINGS Hepatobiliary: Normal liver with no liver mass. Normal gallbladder with no radiopaque cholelithiasis. No biliary ductal dilatation. Small periampullary duodenal diverticulum. Pancreas: Normal, with no mass or duct dilation. Spleen: Normal size. No mass. Adrenals/Urinary Tract: Normal adrenals.  Normal kidneys with no hydronephrosis and no renal mass. Normal bladder. Stomach/Bowel: Small hiatal hernia. Otherwise normal nondistended stomach. Normal caliber small bowel with no small bowel wall thickening. Normal appendix. Oral contrast transits to the left colon. Moderate sigmoid diverticulosis, with no large bowel wall thickening or significant pericolonic fat stranding. Vascular/Lymphatic: Atherosclerotic nonaneurysmal abdominal aorta. Patent portal, splenic, hepatic and renal veins. Mildly enlarged 1.0 cm right inguinal node (series 3/image 105). Mild bilateral external iliac adenopathy up to 1.1 cm on the right (series 3/image 97) and 1.0 cm on the left (series 3/image 99). Mildly enlarged 1.2 cm left common iliac node (series 3/image 82). Left para-aortic adenopathy up to 1.2 cm (series 3/image 70). Aortocaval adenopathy up to 1.1 cm (series 3/image 73). Reproductive: Normal uterus. No right adnexal mass. Simple 5.3 cm left adnexal cyst (series 3/image 94). Other: No pneumoperitoneum, ascites or focal fluid collection. Musculoskeletal: No aggressive appearing focal osseous lesions. Moderate lower lumbar degenerative changes. IMPRESSION: 1. Mild multistation lymphadenopathy in the retroperitoneum and bilateral pelvis as detailed, compatible with lymphoma. Normal size spleen. No thoracic adenopathy. 2. One vessel coronary atherosclerosis. 3. Moderate sigmoid diverticulosis. 4. Simple 5.3 cm left adnexal cyst. Pelvic ultrasound evaluation recommended. This recommendation follows ACR consensus guidelines: White Paper of the ACR Incidental Findings Committee II on Adnexal Findings. J Am Coll Radiol 2013:10:675-681. 5.  Aortic Atherosclerosis (ICD10-I70.0). Electronically Signed   By: Ilona Sorrel M.D.   On: 08/31/2019 12:14   CT ABDOMEN PELVIS W CONTRAST  Result Date: 08/31/2019 CLINICAL DATA:  Inpatient. CLL, presenting for staging. New anemia and severe thrombocytopenia. EXAM: CT CHEST, ABDOMEN, AND  PELVIS WITH CONTRAST TECHNIQUE: Multidetector CT imaging of the chest, abdomen and pelvis was performed following the standard protocol during bolus administration of intravenous contrast. CONTRAST:  154m OMNIPAQUE IOHEXOL 300 MG/ML  SOLN COMPARISON:  05/07/2010 chest CT angiogram. FINDINGS: CT CHEST FINDINGS Cardiovascular: Normal heart size. No significant pericardial effusion/thickening. Left anterior descending coronary atherosclerosis. Atherosclerotic nonaneurysmal thoracic aorta. Normal caliber pulmonary arteries. No central pulmonary emboli. Mediastinum/Nodes: No discrete thyroid nodules. Unremarkable esophagus. No pathologically enlarged axillary, mediastinal or hilar lymph nodes. Lungs/Pleura: No pneumothorax. No pleural effusion. No acute consolidative airspace disease or lung masses. A few scattered perifissural and subpleural solid pulmonary nodules in both lungs measuring up to 5 mm in the anterior right lower lobe (series 5/image 75), all stable since 05/07/2010 chest CT, considered benign. No new significant pulmonary nodules. Mild platelike scarring versus atelectasis at the dependent lung bases bilaterally. Musculoskeletal: No aggressive appearing focal osseous lesions. Mild thoracic spondylosis. Chronic mild posterior T10 vertebral compression deformity. CT ABDOMEN PELVIS FINDINGS Hepatobiliary: Normal liver with no liver mass. Normal gallbladder with no radiopaque cholelithiasis. No biliary ductal dilatation. Small periampullary duodenal diverticulum. Pancreas: Normal, with no mass or duct dilation. Spleen: Normal size. No mass. Adrenals/Urinary Tract: Normal adrenals. Normal kidneys with no hydronephrosis and no renal mass. Normal bladder. Stomach/Bowel: Small hiatal hernia. Otherwise normal nondistended  stomach. Normal caliber small bowel with no small bowel wall thickening. Normal appendix. Oral contrast transits to the left colon. Moderate sigmoid diverticulosis, with no large bowel wall  thickening or significant pericolonic fat stranding. Vascular/Lymphatic: Atherosclerotic nonaneurysmal abdominal aorta. Patent portal, splenic, hepatic and renal veins. Mildly enlarged 1.0 cm right inguinal node (series 3/image 105). Mild bilateral external iliac adenopathy up to 1.1 cm on the right (series 3/image 97) and 1.0 cm on the left (series 3/image 99). Mildly enlarged 1.2 cm left common iliac node (series 3/image 82). Left para-aortic adenopathy up to 1.2 cm (series 3/image 70). Aortocaval adenopathy up to 1.1 cm (series 3/image 73). Reproductive: Normal uterus. No right adnexal mass. Simple 5.3 cm left adnexal cyst (series 3/image 94). Other: No pneumoperitoneum, ascites or focal fluid collection. Musculoskeletal: No aggressive appearing focal osseous lesions. Moderate lower lumbar degenerative changes. IMPRESSION: 1. Mild multistation lymphadenopathy in the retroperitoneum and bilateral pelvis as detailed, compatible with lymphoma. Normal size spleen. No thoracic adenopathy. 2. One vessel coronary atherosclerosis. 3. Moderate sigmoid diverticulosis. 4. Simple 5.3 cm left adnexal cyst. Pelvic ultrasound evaluation recommended. This recommendation follows ACR consensus guidelines: White Paper of the ACR Incidental Findings Committee II on Adnexal Findings. J Am Coll Radiol 2013:10:675-681. 5.  Aortic Atherosclerosis (ICD10-I70.0). Electronically Signed   By: Ilona Sorrel M.D.   On: 08/31/2019 12:14   VAS [place] LOWER EXTREMITY VENOUS (DVT)  Result Date: 08/31/2019  Lower Venous Study Indications: Edema.  Comparison Study: no prior Performing Technologist: June Leap RDMS, RVT  Examination Guidelines: A complete evaluation includes B-mode imaging, spectral Doppler, color Doppler, and power Doppler as needed of all accessible portions of each vessel. Bilateral testing is considered an integral part of a complete examination. Limited examinations for reoccurring indications may be performed as noted.   +-----+---------------+---------+-----------+----------+--------------+ RIGHTCompressibilityPhasicitySpontaneityPropertiesThrombus Aging +-----+---------------+---------+-----------+----------+--------------+ CFV  Full           Yes      Yes                                 +-----+---------------+---------+-----------+----------+--------------+   +---------+---------------+---------+-----------+----------+--------------+ LEFT     CompressibilityPhasicitySpontaneityPropertiesThrombus Aging +---------+---------------+---------+-----------+----------+--------------+ CFV      Full           Yes      Yes                                 +---------+---------------+---------+-----------+----------+--------------+ SFJ      Full                                                        +---------+---------------+---------+-----------+----------+--------------+ FV Prox  Full                                                        +---------+---------------+---------+-----------+----------+--------------+ FV Mid   Full                                                        +---------+---------------+---------+-----------+----------+--------------+  FV DistalFull                                                        +---------+---------------+---------+-----------+----------+--------------+ PFV      Full                                                        +---------+---------------+---------+-----------+----------+--------------+ POP      Full           Yes      Yes                                 +---------+---------------+---------+-----------+----------+--------------+ PTV      Full                                                        +---------+---------------+---------+-----------+----------+--------------+ PERO     Full                                                         +---------+---------------+---------+-----------+----------+--------------+     Summary: Right: No evidence of common femoral vein obstruction. Left: There is no evidence of deep vein thrombosis in the lower extremity. No cystic structure found in the popliteal fossa.  *See table(s) above for measurements and observations.    Preliminary     Medications:   . amLODipine  5 mg Oral Daily  . atorvastatin  40 mg Oral q1800  . dexamethasone  20 mg Oral Daily  . dorzolamide-timolol  1 drop Both Eyes BID  . furosemide  40 mg Oral Daily  . latanoprost  1 drop Both Eyes QHS  . sertraline  25 mg Oral Daily   Continuous Infusions: . sodium chloride    . Immune Globulin 10% Stopped (08/31/19 1110)     LOS: 0 days   Geradine Girt  [facility]   How to contact the [facility] Attending or Consulting provider [place] or covering provider during after hours [place], for this patient?  1. Check the care team in [facility] and look for a) attending/consulting [facility] provider listed and b) the [facility] team listed 2. Log into [email or web address] and use [place]'s universal password to access. If you do not have the password, please contact the hospital operator. 3. Locate the [facility] provider you are looking for under [facility] and page to a number that you can be directly reached. 4. If you still have difficulty reaching the provider, please page the [facility] (Director on Call) for the Hospitalists listed on amion for assistance.  08/31/2019, 3:41 PM

## 2019-08-31 NOTE — Progress Notes (Signed)
LLE venous duplex       has been completed. Preliminary results can be found under CV proc through chart review. Savhanna Sliva, BS, RDMS, RVT    

## 2019-08-31 NOTE — Progress Notes (Signed)
IVIG started at 0530 by iv team, rate at 14.9, rate increased to 29.9 per order, vss, no s/s of adverse reaction noted, will cont to monitor.

## 2019-08-31 NOTE — Progress Notes (Addendum)
Selena Branch   HEMATOLOGY/ONCOLOGY INPATIENT PROGRESS NOTE  Date of Service: 08/31/2019  Inpatient Attending: .Geradine Girt, DO   SUBJECTIVE  Status post 1 unit of platelets on 08/31/2019.  She is currently receiving dexamethasone 20 mg daily x4 doses and has received 2 doses of IVIG so far.  Platelet count 24,000 today. She denies active bleeding.  She has extensive petechiae on her bilateral upper and lower extremities.  She notices swelling in her left lower extremity which she states comes and goes.  It usually worsens when she has been up walking.  Denies pain to her left leg.  Had a CT of the chest, abdomen, pelvis earlier today and results are pending.  OBJECTIVE:  NAD  PHYSICAL EXAMINATION: . Vitals:   08/31/19 0631 08/31/19 0645 08/31/19 1110 08/31/19 1226  BP: (!) 145/57 (!) 149/54 (!) 166/69 (!) 141/71  Pulse: 98 98 98 100  Resp: _0 Temp: 98.4 F (36.9 C) 98.5 F (36.9 C) 98.3 F (36.8 C) 97.8 F (36.6 C)  TempSrc: Oral Oral Oral Oral  SpO2: 95% 97% 96% 93%  Weight:      Height:       GENERAL:alert, in no acute distress and comfortable SKIN: b/l lower extremity petechiae OROPHARYNX:no exudate, no erythema , no gum bleeding.  NECK: supple, no JVD, thyroid normal size, non-tender, without nodularity LYMPH:  no palpable lymphadenopathy in the cervical, axillary or inguinal LUNGS: clear to auscultation with normal respiratory effort HEART: regular rate & rhythm,  no murmurs.  Left lower extremity edema noted.  No edema on the right. ABDOMEN: abdomen soft, non-tender, no significant palpable splenomegaly. Musculoskeletal: no cyanosis of digits and no clubbing  PSYCH: alert & oriented x 3 with fluent speech NEURO: no focal motor/sensory deficits  MEDICAL HISTORY:  Past Medical History:  Diagnosis Date  . Anxiety   . CLL (chronic lymphocytic leukemia) ([place])   . Glaucoma   . Hyperlipemia   . Hypertension   . Osteoporosis     SURGICAL HISTORY: History  reviewed. No pertinent surgical history.  SOCIAL HISTORY: Social History   Socioeconomic History  . Marital status: Married    Spouse name: Not on file  . Number of children: 1  . Years of education: HS  . Highest education level: Not on file  Occupational History  . Occupation: Retired  Tobacco Use  . Smoking status: Never Smoker  . Smokeless tobacco: Never Used  Substance and Sexual Activity  . Alcohol use: No    Alcohol/week: 0.0 standard drinks  . Drug use: No  . Sexual activity: Not on file  Other Topics Concern  . Not on file  Social History Narrative   Drinks 2 cups of coffee   Social Determinants of Health   Financial Resource Strain:   . Difficulty of Paying Living Expenses: Not on file  Food Insecurity:   . Worried About Charity fundraiser in the Last Year: Not on file  . Ran Out of Food in the Last Year: Not on file  Transportation Needs:   . Lack of Transportation (Medical): Not on file  . Lack of Transportation (Non-Medical): Not on file  Physical Activity:   . Days of Exercise per Week: Not on file  . Minutes of Exercise per Session: Not on file  Stress:   . Feeling of Stress : Not on file  Social Connections:   . Frequency of Communication with Friends and Family: Not on file  . Frequency of  Social Gatherings with Friends and Family: Not on file  . Attends Religious Services: Not on file  . Active Member of Clubs or Organizations: Not on file  . Attends Archivist Meetings: Not on file  . Marital Status: Not on file  Intimate Partner Violence:   . Fear of Current or Ex-Partner: Not on file  . Emotionally Abused: Not on file  . Physically Abused: Not on file  . Sexually Abused: Not on file    FAMILY HISTORY: Family History  Problem Relation Age of Onset  . Stroke Mother   . Lung cancer Father     ALLERGIES:  is allergic to codeine.  MEDICATIONS:  Scheduled Meds: . amLODipine  5 mg Oral Daily  . atorvastatin  40 mg Oral q1800    . dexamethasone  20 mg Oral Daily  . dorzolamide-timolol  1 drop Both Eyes BID  . furosemide  40 mg Oral Daily  . latanoprost  1 drop Both Eyes QHS  . sertraline  25 mg Oral Daily   Continuous Infusions: . sodium chloride    . Immune Globulin 10% Stopped (08/31/19 1110)   PRN Meds:.acetaminophen, hydrALAZINE, ondansetron (ZOFRAN) IV  REVIEW OF SYSTEMS:    10 Point review of Systems was done is negative except as noted above.   LABORATORY DATA:  I have reviewed the data as listed  . CBC Latest Ref Rng & Units 08/31/2019 08/30/2019 08/30/2019  WBC 4.0 - 10.5 K/uL 81.1(HH) 84.3(HH) 77.6(HH)  Hemoglobin 12.0 - 15.0 g/dL 11.0(L) 11.7(L) 11.4(L)  Hematocrit 36.0 - 46.0 % 34.4(L) 36.8 35.9(L)  Platelets 150 - 400 K/uL 24(LL) 6(LL) <5(LL)    . CMP Latest Ref Rng & Units 08/31/2019 08/30/2019 08/30/2019  Glucose 70 - 99 mg/dL 132(H) 144(H) 100(H)  BUN 8 - 23 mg/dL 21 25(H) 23  Creatinine 0.44 - 1.00 mg/dL 0.77 0.91 0.82  Sodium 135 - 145 mmol/L 139 139 144  Potassium 3.5 - 5.1 mmol/L 4.5 4.7 4.5  Chloride 98 - 111 mmol/L 108 109 110  CO2 22 - 32 mmol/L 21(L) 20(L) 25  Calcium 8.9 - 10.3 mg/dL 9.2 9.8 9.3  Total Protein 6.5 - 8.1 g/dL 6.6 - 6.3(L)  Total Bilirubin 0.3 - 1.2 mg/dL 1.0 - 0.7  Alkaline Phos 38 - 126 U/L 85 - 95  AST 15 - 41 U/L 41 - 32  ALT 0 - 44 U/L 22 - 18   Component     Latest Ref Rng & Units 08/30/2019  Retic Ct Pct     0.4 - 3.1 % 2.3  RBC.     3.87 - 5.11 MIL/uL 4.03  Retic Count, Absolute     19.0 - 186.0 K/uL 91.5  Immature Retic Fract     2.3 - 15.9 % 23.9 (H)  DAT, complement      NEG  DAT, IgG      POS . . .  LDH     98 - 192 U/L 584 (H)    RADIOGRAPHIC STUDIES: I have personally reviewed the radiological images as listed and agreed with the findings in the report. CT CHEST W CONTRAST  Result Date: 08/31/2019 CLINICAL DATA:  Inpatient. CLL, presenting for staging. New anemia and severe thrombocytopenia. EXAM: CT CHEST, ABDOMEN, AND  PELVIS WITH CONTRAST TECHNIQUE: Multidetector CT imaging of the chest, abdomen and pelvis was performed following the standard protocol during bolus administration of intravenous contrast. CONTRAST:  19m OMNIPAQUE IOHEXOL 300 MG/ML  SOLN COMPARISON:  05/07/2010 chest CT  angiogram. FINDINGS: CT CHEST FINDINGS Cardiovascular: Normal heart size. No significant pericardial effusion/thickening. Left anterior descending coronary atherosclerosis. Atherosclerotic nonaneurysmal thoracic aorta. Normal caliber pulmonary arteries. No central pulmonary emboli. Mediastinum/Nodes: No discrete thyroid nodules. Unremarkable esophagus. No pathologically enlarged axillary, mediastinal or hilar lymph nodes. Lungs/Pleura: No pneumothorax. No pleural effusion. No acute consolidative airspace disease or lung masses. A few scattered perifissural and subpleural solid pulmonary nodules in both lungs measuring up to 5 mm in the anterior right lower lobe (series 5/image 75), all stable since 05/07/2010 chest CT, considered benign. No new significant pulmonary nodules. Mild platelike scarring versus atelectasis at the dependent lung bases bilaterally. Musculoskeletal: No aggressive appearing focal osseous lesions. Mild thoracic spondylosis. Chronic mild posterior T10 vertebral compression deformity. CT ABDOMEN PELVIS FINDINGS Hepatobiliary: Normal liver with no liver mass. Normal gallbladder with no radiopaque cholelithiasis. No biliary ductal dilatation. Small periampullary duodenal diverticulum. Pancreas: Normal, with no mass or duct dilation. Spleen: Normal size. No mass. Adrenals/Urinary Tract: Normal adrenals. Normal kidneys with no hydronephrosis and no renal mass. Normal bladder. Stomach/Bowel: Small hiatal hernia. Otherwise normal nondistended stomach. Normal caliber small bowel with no small bowel wall thickening. Normal appendix. Oral contrast transits to the left colon. Moderate sigmoid diverticulosis, with no large bowel wall  thickening or significant pericolonic fat stranding. Vascular/Lymphatic: Atherosclerotic nonaneurysmal abdominal aorta. Patent portal, splenic, hepatic and renal veins. Mildly enlarged 1.0 cm right inguinal node (series 3/image 105). Mild bilateral external iliac adenopathy up to 1.1 cm on the right (series 3/image 97) and 1.0 cm on the left (series 3/image 99). Mildly enlarged 1.2 cm left common iliac node (series 3/image 82). Left para-aortic adenopathy up to 1.2 cm (series 3/image 70). Aortocaval adenopathy up to 1.1 cm (series 3/image 73). Reproductive: Normal uterus. No right adnexal mass. Simple 5.3 cm left adnexal cyst (series 3/image 94). Other: No pneumoperitoneum, ascites or focal fluid collection. Musculoskeletal: No aggressive appearing focal osseous lesions. Moderate lower lumbar degenerative changes. IMPRESSION: 1. Mild multistation lymphadenopathy in the retroperitoneum and bilateral pelvis as detailed, compatible with lymphoma. Normal size spleen. No thoracic adenopathy. 2. One vessel coronary atherosclerosis. 3. Moderate sigmoid diverticulosis. 4. Simple 5.3 cm left adnexal cyst. Pelvic ultrasound evaluation recommended. This recommendation follows ACR consensus guidelines: White Paper of the ACR Incidental Findings Committee II on Adnexal Findings. J Am Coll Radiol 2013:10:675-681. 5.  Aortic Atherosclerosis (ICD10-I70.0). Electronically Signed   By: Ilona Sorrel M.D.   On: 08/31/2019 12:14   CT ABDOMEN PELVIS W CONTRAST  Result Date: 08/31/2019 CLINICAL DATA:  Inpatient. CLL, presenting for staging. New anemia and severe thrombocytopenia. EXAM: CT CHEST, ABDOMEN, AND PELVIS WITH CONTRAST TECHNIQUE: Multidetector CT imaging of the chest, abdomen and pelvis was performed following the standard protocol during bolus administration of intravenous contrast. CONTRAST:  154m OMNIPAQUE IOHEXOL 300 MG/ML  SOLN COMPARISON:  05/07/2010 chest CT angiogram. FINDINGS: CT CHEST FINDINGS Cardiovascular:  Normal heart size. No significant pericardial effusion/thickening. Left anterior descending coronary atherosclerosis. Atherosclerotic nonaneurysmal thoracic aorta. Normal caliber pulmonary arteries. No central pulmonary emboli. Mediastinum/Nodes: No discrete thyroid nodules. Unremarkable esophagus. No pathologically enlarged axillary, mediastinal or hilar lymph nodes. Lungs/Pleura: No pneumothorax. No pleural effusion. No acute consolidative airspace disease or lung masses. A few scattered perifissural and subpleural solid pulmonary nodules in both lungs measuring up to 5 mm in the anterior right lower lobe (series 5/image 75), all stable since 05/07/2010 chest CT, considered benign. No new significant pulmonary nodules. Mild platelike scarring versus atelectasis at the dependent lung bases bilaterally. Musculoskeletal: No aggressive  appearing focal osseous lesions. Mild thoracic spondylosis. Chronic mild posterior T10 vertebral compression deformity. CT ABDOMEN PELVIS FINDINGS Hepatobiliary: Normal liver with no liver mass. Normal gallbladder with no radiopaque cholelithiasis. No biliary ductal dilatation. Small periampullary duodenal diverticulum. Pancreas: Normal, with no mass or duct dilation. Spleen: Normal size. No mass. Adrenals/Urinary Tract: Normal adrenals. Normal kidneys with no hydronephrosis and no renal mass. Normal bladder. Stomach/Bowel: Small hiatal hernia. Otherwise normal nondistended stomach. Normal caliber small bowel with no small bowel wall thickening. Normal appendix. Oral contrast transits to the left colon. Moderate sigmoid diverticulosis, with no large bowel wall thickening or significant pericolonic fat stranding. Vascular/Lymphatic: Atherosclerotic nonaneurysmal abdominal aorta. Patent portal, splenic, hepatic and renal veins. Mildly enlarged 1.0 cm right inguinal node (series 3/image 105). Mild bilateral external iliac adenopathy up to 1.1 cm on the right (series 3/image 97) and 1.0 cm  on the left (series 3/image 99). Mildly enlarged 1.2 cm left common iliac node (series 3/image 82). Left para-aortic adenopathy up to 1.2 cm (series 3/image 70). Aortocaval adenopathy up to 1.1 cm (series 3/image 73). Reproductive: Normal uterus. No right adnexal mass. Simple 5.3 cm left adnexal cyst (series 3/image 94). Other: No pneumoperitoneum, ascites or focal fluid collection. Musculoskeletal: No aggressive appearing focal osseous lesions. Moderate lower lumbar degenerative changes. IMPRESSION: 1. Mild multistation lymphadenopathy in the retroperitoneum and bilateral pelvis as detailed, compatible with lymphoma. Normal size spleen. No thoracic adenopathy. 2. One vessel coronary atherosclerosis. 3. Moderate sigmoid diverticulosis. 4. Simple 5.3 cm left adnexal cyst. Pelvic ultrasound evaluation recommended. This recommendation follows ACR consensus guidelines: White Paper of the ACR Incidental Findings Committee II on Adnexal Findings. J Am Coll Radiol 2013:10:675-681. 5.  Aortic Atherosclerosis (ICD10-I70.0). Electronically Signed   By: Ilona Sorrel M.D.   On: 08/31/2019 12:14    ASSESSMENT & PLAN:   #1 CLL Previous CLL prognostic FISH panel without detectable mutations. Has been Rai stage 0.. Now with new anemia and severe thrombocytopenia. WBC counts have increased significantly to 84k from 39k 3 months - WBC down slightly today to 81.1k  ago.  Concerning for change in tempo of her disease  No overt palpable LNadenopathy or hepatosplenomegaly  #2 severe thrombocytopenia and presented with platelet counts around 5k likely related to CLL.  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  However cannot rule out bone marrow involvement as an etiology of thrombocytopenia as well.  #3 new mild normocytic anemia.  Presented with hemoglobin 11.7 with an MCV of 97.4. Likely related to her CLL. Elevated LDH level in the 500s would need to rule out autoimmune  hemolysis. Haptoglobin undetectable (<10), normal bilirubin Coombs test positive for IgG suggesting could be an element of autoimmune hemolysis.  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 -Platelet count has improved to 24,000 today and she has no active bleeding.  No platelet transfusion indicated at this time. -transfuse platelets to maintain PLT count of >10kor if bleeding. -We will recheck CBC later today around 1500. -CT chest abdomen pelvis for evaluation of her CLL. Mild abdominal/pelvic LNadenopathy no evidence of  -We will likely need bone marrow biopsy next week. -Left lower extremity swelling noted.   Doppler ultrasound of her lower extremities  to evaluate for DVT was done and prelim was neg for DVT. -if platelets > 50k on labs this afternoon could hold 2nd dose of IVIG and discharge home to complete 4 days total of Dexamethasone 36m po daily with breakfast. -we shall setup patient for outpatient labs on 09/05/2019 with platelet transfusion as needed and labs and f/u with me on 09/16/2018. -if platelets <50k would give 2nd dose of IVIG this evening and patient could discharge with same plan with steroids if plt>30k tomorrow AM.   KMikey Bussing DNP, AGPCNP-BC, AOCNP   08/31/2019 12:40 PM   ADDENDUM  .Patient was Personally and independently interviewed, examined and relevant elements of the history of present illness were reviewed in details and an assessment and plan was created. All elements of the patient's history of present illness , assessment and plan were discussed in details with KMikey Bussing DNP. The above documentation reflects our combined findings  assessment and plan.  GSullivan LoneMD MS

## 2019-09-01 LAB — CBC
HCT: 32.3 % — ABNORMAL LOW (ref 36.0–46.0)
Hemoglobin: 10.3 g/dL — ABNORMAL LOW (ref 12.0–15.0)
MCH: 30.4 pg (ref 26.0–34.0)
MCHC: 31.9 g/dL (ref 30.0–36.0)
MCV: 95.3 fL (ref 80.0–100.0)
Platelets: 63 10*3/uL — ABNORMAL LOW (ref 150–400)
RBC: 3.39 MIL/uL — ABNORMAL LOW (ref 3.87–5.11)
RDW: 14.1 % (ref 11.5–15.5)
WBC: 56.9 10*3/uL (ref 4.0–10.5)
nRBC: 0 % (ref 0.0–0.2)

## 2019-09-01 LAB — PATHOLOGIST SMEAR REVIEW

## 2019-09-01 MED ORDER — FUROSEMIDE 20 MG PO TABS: ORAL_TABLET | ORAL | Status: DC

## 2019-09-01 MED ORDER — DEXAMETHASONE 4 MG PO TABS: ORAL_TABLET | ORAL | 0 refills | Status: DC

## 2019-09-01 NOTE — Discharge Summary (Signed)
Physician Discharge Summary  Selena Branch WGN:[id number] DOB: March 28, 1941 DOA: 08/30/2019  PCP: [name], MD  Admit date: 08/30/2019 Discharge date: 09/01/2019  Admitted From: home Discharge disposition: home   Recommendations for Outpatient Follow-Up:   1. Cbc 1 week re: platlets   Discharge Diagnosis:   Active Problems:   Thrombocytopenia ([place])   Acute ITP ([place])    Discharge Condition: Improved.  Diet recommendation: Regular.  Wound care: None.  Code status: Full.   History of Present Illness:   Selena Branch is a 78 y.o. 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  Patient has been on her usual health condition untill about a week ago, she started to notice some rash on her legs, but she denied any easy bruising or bleeding, no fever chills no cough no short of breath no chest pains, no joint pain no weight loss.  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   Hospital Course by Problem:   thrombocytopenia -oncology consult appreciated -plan: steroids and IVIG:  -Dexamethasone 8m po daily x 4 doses and then taper -IVIG 1g/kg q24h x 2 doses  - will likely need bone marrow biopsy next week. -plts today are 63!  CLL -defer to Dr. KDyane Dustmaneyedrops.  Anemia of unknown type -defer to oncology -Hgb stable   Medical Consultants:      Discharge Exam:   Vitals:   09/01/19 1255 09/01/19 1314  BP: 138/62 (!) 132/55  Pulse: 97 94  Resp: 16 16  Temp: 98.6 F (37 C) 98.9 F (37.2 C)  SpO2: 93% 94%   Vitals:   09/01/19 1238 09/01/19 1251 09/01/19 1255 09/01/19 1314  BP: (!) 141/58  138/62 (!) 132/55  Pulse: 97  97 94  Resp:   16 16  Temp: 99.5 F (37.5  C) 98.6 F (37 C) 98.6 F (37 C) 98.9 F (37.2 C)  TempSrc: Oral Oral Oral Oral  SpO2: 94%  93% 94%  Weight:      Height:        General exam: Appears calm and comfortable. LE rash fading on day of discharge    The results of significant diagnostics from this hospitalization (including imaging, microbiology, ancillary and laboratory) are listed below for reference.     Procedures and Diagnostic Studies:   CT CHEST W CONTRAST  Result Date: 08/31/2019 CLINICAL DATA:  Inpatient. CLL, presenting for staging. New anemia and severe thrombocytopenia. EXAM: CT CHEST, ABDOMEN, AND PELVIS WITH CONTRAST TECHNIQUE: Multidetector CT imaging of the chest, abdomen and pelvis was performed following the standard protocol during bolus administration of intravenous contrast. CONTRAST:  1076mOMNIPAQUE IOHEXOL 300 MG/ML  SOLN COMPARISON:  05/07/2010 chest CT angiogram. FINDINGS: CT CHEST FINDINGS Cardiovascular: Normal heart size. No significant pericardial effusion/thickening. Left anterior descending coronary atherosclerosis. Atherosclerotic nonaneurysmal thoracic aorta. Normal caliber pulmonary arteries. No central pulmonary emboli. Mediastinum/Nodes: No discrete thyroid nodules. Unremarkable esophagus. No pathologically enlarged axillary, mediastinal or hilar lymph nodes. Lungs/Pleura: No pneumothorax. No pleural effusion. No acute consolidative airspace disease or lung masses. A few scattered perifissural and subpleural solid pulmonary nodules in both lungs measuring up to 5 mm in the anterior right lower lobe (series 5/image 75), all stable since 05/07/2010 chest CT, considered benign.  No new significant pulmonary nodules. Mild platelike scarring versus atelectasis at the dependent lung bases bilaterally. Musculoskeletal: No aggressive appearing focal osseous lesions. Mild thoracic spondylosis. Chronic mild posterior T10 vertebral compression deformity. CT ABDOMEN PELVIS FINDINGS Hepatobiliary: Normal liver  with no liver mass. Normal gallbladder with no radiopaque cholelithiasis. No biliary ductal dilatation. Small periampullary duodenal diverticulum. Pancreas: Normal, with no mass or duct dilation. Spleen: Normal size. No mass. Adrenals/Urinary Tract: Normal adrenals. Normal kidneys with no hydronephrosis and no renal mass. Normal bladder. Stomach/Bowel: Small hiatal hernia. Otherwise normal nondistended stomach. Normal caliber small bowel with no small bowel wall thickening. Normal appendix. Oral contrast transits to the left colon. Moderate sigmoid diverticulosis, with no large bowel wall thickening or significant pericolonic fat stranding. Vascular/Lymphatic: Atherosclerotic nonaneurysmal abdominal aorta. Patent portal, splenic, hepatic and renal veins. Mildly enlarged 1.0 cm right inguinal node (series 3/image 105). Mild bilateral external iliac adenopathy up to 1.1 cm on the right (series 3/image 97) and 1.0 cm on the left (series 3/image 99). Mildly enlarged 1.2 cm left common iliac node (series 3/image 82). Left para-aortic adenopathy up to 1.2 cm (series 3/image 70). Aortocaval adenopathy up to 1.1 cm (series 3/image 73). Reproductive: Normal uterus. No right adnexal mass. Simple 5.3 cm left adnexal cyst (series 3/image 94). Other: No pneumoperitoneum, ascites or focal fluid collection. Musculoskeletal: No aggressive appearing focal osseous lesions. Moderate lower lumbar degenerative changes. IMPRESSION: 1. Mild multistation lymphadenopathy in the retroperitoneum and bilateral pelvis as detailed, compatible with lymphoma. Normal size spleen. No thoracic adenopathy. 2. One vessel coronary atherosclerosis. 3. Moderate sigmoid diverticulosis. 4. Simple 5.3 cm left adnexal cyst. Pelvic ultrasound evaluation recommended. This recommendation follows ACR consensus guidelines: White Paper of the ACR Incidental Findings Committee II on Adnexal Findings. J Am Coll Radiol 2013:10:675-681. 5.  Aortic Atherosclerosis  (ICD10-I70.0). Electronically Signed   By: Ilona Sorrel M.D.   On: 08/31/2019 12:14   CT ABDOMEN PELVIS W CONTRAST  Result Date: 08/31/2019 CLINICAL DATA:  Inpatient. CLL, presenting for staging. New anemia and severe thrombocytopenia. EXAM: CT CHEST, ABDOMEN, AND PELVIS WITH CONTRAST TECHNIQUE: Multidetector CT imaging of the chest, abdomen and pelvis was performed following the standard protocol during bolus administration of intravenous contrast. CONTRAST:  159m OMNIPAQUE IOHEXOL 300 MG/ML  SOLN COMPARISON:  05/07/2010 chest CT angiogram. FINDINGS: CT CHEST FINDINGS Cardiovascular: Normal heart size. No significant pericardial effusion/thickening. Left anterior descending coronary atherosclerosis. Atherosclerotic nonaneurysmal thoracic aorta. Normal caliber pulmonary arteries. No central pulmonary emboli. Mediastinum/Nodes: No discrete thyroid nodules. Unremarkable esophagus. No pathologically enlarged axillary, mediastinal or hilar lymph nodes. Lungs/Pleura: No pneumothorax. No pleural effusion. No acute consolidative airspace disease or lung masses. A few scattered perifissural and subpleural solid pulmonary nodules in both lungs measuring up to 5 mm in the anterior right lower lobe (series 5/image 75), all stable since 05/07/2010 chest CT, considered benign. No new significant pulmonary nodules. Mild platelike scarring versus atelectasis at the dependent lung bases bilaterally. Musculoskeletal: No aggressive appearing focal osseous lesions. Mild thoracic spondylosis. Chronic mild posterior T10 vertebral compression deformity. CT ABDOMEN PELVIS FINDINGS Hepatobiliary: Normal liver with no liver mass. Normal gallbladder with no radiopaque cholelithiasis. No biliary ductal dilatation. Small periampullary duodenal diverticulum. Pancreas: Normal, with no mass or duct dilation. Spleen: Normal size. No mass. Adrenals/Urinary Tract: Normal adrenals. Normal kidneys with no hydronephrosis and no renal mass. Normal  bladder. Stomach/Bowel: Small hiatal hernia. Otherwise normal nondistended stomach. Normal caliber small bowel with no small bowel wall thickening. Normal appendix. Oral contrast transits to the left  colon. Moderate sigmoid diverticulosis, with no large bowel wall thickening or significant pericolonic fat stranding. Vascular/Lymphatic: Atherosclerotic nonaneurysmal abdominal aorta. Patent portal, splenic, hepatic and renal veins. Mildly enlarged 1.0 cm right inguinal node (series 3/image 105). Mild bilateral external iliac adenopathy up to 1.1 cm on the right (series 3/image 97) and 1.0 cm on the left (series 3/image 99). Mildly enlarged 1.2 cm left common iliac node (series 3/image 82). Left para-aortic adenopathy up to 1.2 cm (series 3/image 70). Aortocaval adenopathy up to 1.1 cm (series 3/image 73). Reproductive: Normal uterus. No right adnexal mass. Simple 5.3 cm left adnexal cyst (series 3/image 94). Other: No pneumoperitoneum, ascites or focal fluid collection. Musculoskeletal: No aggressive appearing focal osseous lesions. Moderate lower lumbar degenerative changes. IMPRESSION: 1. Mild multistation lymphadenopathy in the retroperitoneum and bilateral pelvis as detailed, compatible with lymphoma. Normal size spleen. No thoracic adenopathy. 2. One vessel coronary atherosclerosis. 3. Moderate sigmoid diverticulosis. 4. Simple 5.3 cm left adnexal cyst. Pelvic ultrasound evaluation recommended. This recommendation follows ACR consensus guidelines: White Paper of the ACR Incidental Findings Committee II on Adnexal Findings. J Am Coll Radiol 2013:10:675-681. 5.  Aortic Atherosclerosis (ICD10-I70.0). Electronically Signed   By: Ilona Sorrel M.D.   On: 08/31/2019 12:14   VAS [place] LOWER EXTREMITY VENOUS (DVT)  Result Date: 08/31/2019  Lower Venous Study Indications: Edema.  Comparison Study: no prior Performing Technologist: June Leap RDMS, RVT  Examination Guidelines: A complete evaluation includes B-mode  imaging, spectral Doppler, color Doppler, and power Doppler as needed of all accessible portions of each vessel. Bilateral testing is considered an integral part of a complete examination. Limited examinations for reoccurring indications may be performed as noted.  +-----+---------------+---------+-----------+----------+--------------+ RIGHTCompressibilityPhasicitySpontaneityPropertiesThrombus Aging +-----+---------------+---------+-----------+----------+--------------+ CFV  Full           Yes      Yes                                 +-----+---------------+---------+-----------+----------+--------------+   +---------+---------------+---------+-----------+----------+--------------+ LEFT     CompressibilityPhasicitySpontaneityPropertiesThrombus Aging +---------+---------------+---------+-----------+----------+--------------+ CFV      Full           Yes      Yes                                 +---------+---------------+---------+-----------+----------+--------------+ SFJ      Full                                                        +---------+---------------+---------+-----------+----------+--------------+ FV Prox  Full                                                        +---------+---------------+---------+-----------+----------+--------------+ FV Mid   Full                                                        +---------+---------------+---------+-----------+----------+--------------+ FV DistalFull                                                        +---------+---------------+---------+-----------+----------+--------------+  PFV      Full                                                        +---------+---------------+---------+-----------+----------+--------------+ POP      Full           Yes      Yes                                 +---------+---------------+---------+-----------+----------+--------------+ PTV      Full                                                         +---------+---------------+---------+-----------+----------+--------------+ PERO     Full                                                        +---------+---------------+---------+-----------+----------+--------------+     Summary: Right: No evidence of common femoral vein obstruction. Left: There is no evidence of deep vein thrombosis in the lower extremity. No cystic structure found in the popliteal fossa.  *See table(s) above for measurements and observations.    Preliminary      Labs:   Basic Metabolic Panel: Recent Labs  Lab 08/30/19 0845 08/30/19 1611 08/31/19 0337  NA 144 139 139  K 4.5 4.7 4.5  CL 110 109 108  CO2 25 20* 21*  GLUCOSE 100* 144* 132*  BUN 23 25* 21  CREATININE 0.82 0.91 0.77  CALCIUM 9.3 9.8 9.2   GFR Estimated Creatinine Clearance: 39.9 mL/min (by C-G formula based on SCr of 0.77 mg/dL). Liver Function Tests: Recent Labs  Lab 08/30/19 0845 08/31/19 0337  AST 32 41  ALT 18 22  ALKPHOS 95 85  BILITOT 0.7 1.0  PROT 6.3* 6.6  ALBUMIN 3.9 3.8   No results for input(s): LIPASE, AMYLASE in the last 168 hours. No results for input(s): AMMONIA in the last 168 hours. Coagulation profile No results for input(s): INR, PROTIME in the last 168 hours.  CBC: Recent Labs  Lab 08/30/19 0845 08/30/19 1611 08/31/19 0337 08/31/19 1532 09/01/19 0132  WBC 77.6* 84.3* 81.1* 69.3* 56.9*  NEUTROABS 3.9 3.6 5.2 9.0*  --   HGB 11.4* 11.7* 11.0* 10.6* 10.3*  HCT 35.9* 36.8 34.4* 32.4* 32.3*  MCV 97.1 97.4 95.0 93.1 95.3  PLT <5* 6* 24* 30* 63*   Cardiac Enzymes: No results for input(s): CKTOTAL, CKMB, CKMBINDEX, TROPONINI in the last 168 hours. BNP: Invalid input(s): POCBNP CBG: No results for input(s): GLUCAP in the last 168 hours. D-Dimer No results for input(s): DDIMER in the last 72 hours. Hgb A1c No results for input(s): HGBA1C in the last 72 hours. Lipid Profile No results for input(s): CHOL,  HDL, LDLCALC, TRIG, CHOLHDL, LDLDIRECT in the last 72 hours. Thyroid function studies No results for input(s): TSH, T4TOTAL, T3FREE, THYROIDAB in the last 72 hours.  Invalid input(s): FREET3 Anemia work up [employer]  08/30/19 1034  RETICCTPCT 2.3   Microbiology Recent Results (from the past 240 hour(s))  SARS CORONAVIRUS 2 (TAT 6-24 HRS) Nasopharyngeal Nasopharyngeal Swab     Status: None   Collection Time: 08/30/19  6:01 PM   Specimen: Nasopharyngeal Swab  Result Value Ref Range Status   SARS Coronavirus 2 NEGATIVE NEGATIVE Final    Comment: (NOTE) SARS-CoV-2 target nucleic acids are NOT DETECTED. The SARS-CoV-2 RNA is generally detectable in upper and lower respiratory specimens during the acute phase of infection. Negative results do not preclude SARS-CoV-2 infection, do not rule out co-infections with other pathogens, and should not be used as the sole basis for treatment or other patient management decisions. Negative results must be combined with clinical observations, patient history, and epidemiological information. The expected result is Negative. Fact Sheet for Patients: SugarRoll.be Fact Sheet for Healthcare Providers: https://www.woods-mathews.com/ This test is not yet approved or cleared by the [place] FDA and  has been authorized for detection and/or diagnosis of SARS-CoV-2 by FDA under an Emergency Use Authorization (EUA). This EUA will remain  in effect (meaning this test can be used) for the duration of the COVID-19 declaration under Section 56 4(b)(1) of the Act, 21 U.S.C. section 360bbb-3(b)(1), unless the authorization is terminated or revoked sooner. Performed at [facility], [place] [address], [place], [place] [address]      Discharge Instructions:   Discharge Instructions    Diet - low sodium heart healthy   Complete by: As directed    Discharge instructions   Complete by: As directed     Lasix: Take 40 mg for 3 days then resume your 20 mg daily   Increase activity slowly   Complete by: As directed      Allergies as of 09/01/2019      Reactions   Codeine Nausea Only      Medication List    TAKE these medications   amLODipine 5 MG tablet Commonly known as: NORVASC Take 5 mg by mouth at bedtime.   atorvastatin 40 MG tablet Commonly known as: LIPITOR Take 40 mg by mouth daily.   dexamethasone 4 MG tablet Commonly known as: DECADRON Dexamethasone 56m po daily x 1 day then 16 mg x 1 day then 12 mg x 1 day then 8 mg x 1 day then 4 mg x 1 day then stop Start taking on: September 02, 2019   dorzolamide-timolol 22.3-6.8 MG/ML ophthalmic solution Commonly known as: COSOPT Place 1 drop into both eyes daily.   furosemide 20 MG tablet Commonly known as: LASIX Take 40 mg for 3 days then resume your 20 mg daily What changed:   how much to take  how to take this  when to take this  additional instructions   latanoprost 0.005 % ophthalmic solution Commonly known as: XALATAN Place 1 drop into both eyes at bedtime.   multivitamin capsule Take 1 capsule by mouth daily.   quinapril 40 MG tablet Commonly known as: ACCUPRIL Take 40 mg by mouth daily.   sertraline 25 MG tablet Commonly known as: ZOLOFT Take 25 mg by mouth daily.   Simbrinza 1-0.2 % Susp Generic drug: Brinzolamide-Brimonidine Place 1 drop into both eyes 2 (two) times daily.   Vitamin D3 50 MCG (2000 UT) Tabs Take 2,000 Units by mouth daily.      Follow-up Information    MCari Caraway MD Follow up in 1 week(s).   Specialty: Family Medicine Contact information: [id number]  [phone]        [name], MD Follow up in 1 week(s).   Specialties: Hematology, Oncology Why: cbc to follow platlets Contact information: [place] [address] [phone]            Time coordinating discharge: 35  min  Signed:  Geradine Girt DO  [facility] 09/01/2019, 2:54 PM

## 2019-09-01 NOTE — Progress Notes (Signed)
RN gave pt discharge instructions and pt stated understanding 1 medication escribed to pt home pharmacy. Pt did not have any questions, she packed all her belongings. IV has been removed.

## 2019-09-03 ENCOUNTER — Telehealth: Payer: Self-pay | Admitting: Hematology

## 2019-09-03 LAB — TYPE AND SCREEN
ABO/RH(D): O POS
Antibody Screen: POSITIVE
DAT, IgG: POSITIVE
Unit division: 0
Unit division: 0

## 2019-09-03 LAB — BPAM RBC
Blood Product Expiration Date: 202101052359
Blood Product Expiration Date: 202101102359
Unit Type and Rh: 5100
Unit Type and Rh: 5100

## 2019-09-03 NOTE — Telephone Encounter (Signed)
Scheduled appt per 12/18 sch message - pt is aware of appt date and time   

## 2019-09-05 ENCOUNTER — Inpatient Hospital Stay: Payer: PPO

## 2019-09-05 ENCOUNTER — Other Ambulatory Visit: Payer: Self-pay

## 2019-09-05 DIAGNOSIS — C9110 Chronic lymphocytic leukemia of B-cell type not having achieved remission: Secondary | ICD-10-CM

## 2019-09-05 DIAGNOSIS — D693 Immune thrombocytopenic purpura: Secondary | ICD-10-CM | POA: Diagnosis not present

## 2019-09-05 DIAGNOSIS — E785 Hyperlipidemia, unspecified: Secondary | ICD-10-CM | POA: Diagnosis not present

## 2019-09-05 DIAGNOSIS — Z801 Family history of malignant neoplasm of trachea, bronchus and lung: Secondary | ICD-10-CM | POA: Diagnosis not present

## 2019-09-05 DIAGNOSIS — I10 Essential (primary) hypertension: Secondary | ICD-10-CM | POA: Diagnosis not present

## 2019-09-05 LAB — CBC WITH DIFFERENTIAL (CANCER CENTER ONLY)
Abs Immature Granulocytes: 0.06 10*3/uL (ref 0.00–0.07)
Basophils Absolute: 0 10*3/uL (ref 0.0–0.1)
Basophils Relative: 0 %
Eosinophils Absolute: 0 10*3/uL (ref 0.0–0.5)
Eosinophils Relative: 0 %
HCT: 34.3 % — ABNORMAL LOW (ref 36.0–46.0)
Hemoglobin: 11.1 g/dL — ABNORMAL LOW (ref 12.0–15.0)
Immature Granulocytes: 0 %
Lymphocytes Relative: 71 %
Lymphs Abs: 19.1 10*3/uL — ABNORMAL HIGH (ref 0.7–4.0)
MCH: 30.7 pg (ref 26.0–34.0)
MCHC: 32.4 g/dL (ref 30.0–36.0)
MCV: 95 fL (ref 80.0–100.0)
Monocytes Absolute: 1.9 10*3/uL — ABNORMAL HIGH (ref 0.1–1.0)
Monocytes Relative: 7 %
Neutro Abs: 5.8 10*3/uL (ref 1.7–7.7)
Neutrophils Relative %: 22 %
Platelet Count: 239 10*3/uL (ref 150–400)
RBC: 3.61 MIL/uL — ABNORMAL LOW (ref 3.87–5.11)
RDW: 14.3 % (ref 11.5–15.5)
WBC Count: 26.9 10*3/uL — ABNORMAL HIGH (ref 4.0–10.5)
nRBC: 0 % (ref 0.0–0.2)

## 2019-09-05 LAB — CMP (CANCER CENTER ONLY)
ALT: 22 U/L (ref 0–44)
AST: 26 U/L (ref 15–41)
Albumin: 3.7 g/dL (ref 3.5–5.0)
Alkaline Phosphatase: 82 U/L (ref 38–126)
Anion gap: 8 (ref 5–15)
BUN: 29 mg/dL — ABNORMAL HIGH (ref 8–23)
CO2: 21 mmol/L — ABNORMAL LOW (ref 22–32)
Calcium: 8.7 mg/dL — ABNORMAL LOW (ref 8.9–10.3)
Chloride: 106 mmol/L (ref 98–111)
Creatinine: 0.78 mg/dL (ref 0.44–1.00)
GFR, Est AFR Am: >60 mL/min (ref >60)
GFR, Estimated: >60 mL/min (ref >60)
Glucose, Bld: 173 mg/dL — ABNORMAL HIGH (ref 70–99)
Potassium: 4.1 mmol/L (ref 3.5–5.1)
Sodium: 135 mmol/L (ref 135–145)
Total Bilirubin: 0.5 mg/dL (ref 0.3–1.2)
Total Protein: 8 g/dL (ref 6.5–8.1)

## 2019-09-05 LAB — LACTATE DEHYDROGENASE: LDH: 275 U/L — ABNORMAL HIGH (ref 98–192)

## 2019-09-05 NOTE — Progress Notes (Signed)
Dr. Irene Limbo reviewed lab results today.  Pt did not meet transfusion parameters.  Explanations given to pt.  Pt aware of next office visit with Dr. Irene Limbo.

## 2019-09-06 LAB — BETA 2 MICROGLOBULIN, SERUM: Beta-2 Microglobulin: 2 mg/L (ref 0.6–2.4)

## 2019-09-11 ENCOUNTER — Inpatient Hospital Stay (HOSPITAL_BASED_OUTPATIENT_CLINIC_OR_DEPARTMENT_OTHER): Payer: PPO | Admitting: Medical

## 2019-09-11 ENCOUNTER — Telehealth: Payer: Self-pay | Admitting: *Deleted

## 2019-09-11 ENCOUNTER — Other Ambulatory Visit: Payer: Self-pay | Admitting: *Deleted

## 2019-09-11 ENCOUNTER — Inpatient Hospital Stay: Payer: PPO

## 2019-09-11 ENCOUNTER — Other Ambulatory Visit: Payer: Self-pay

## 2019-09-11 VITALS — BP 146/63 | HR 74 | Temp 98.4°F | Resp 16 | Ht <= 58 in | Wt 105.9 lb

## 2019-09-11 DIAGNOSIS — D693 Immune thrombocytopenic purpura: Secondary | ICD-10-CM

## 2019-09-11 DIAGNOSIS — C9110 Chronic lymphocytic leukemia of B-cell type not having achieved remission: Secondary | ICD-10-CM

## 2019-09-11 LAB — CMP (CANCER CENTER ONLY)
ALT: 25 U/L (ref 0–44)
AST: 29 U/L (ref 15–41)
Albumin: 3.5 g/dL (ref 3.5–5.0)
Alkaline Phosphatase: 64 U/L (ref 38–126)
Anion gap: 7 (ref 5–15)
BUN: 24 mg/dL — ABNORMAL HIGH (ref 8–23)
CO2: 25 mmol/L (ref 22–32)
Calcium: 9 mg/dL (ref 8.9–10.3)
Chloride: 109 mmol/L (ref 98–111)
Creatinine: 0.63 mg/dL (ref 0.44–1.00)
GFR, Est AFR Am: >60 mL/min (ref >60)
GFR, Estimated: >60 mL/min (ref >60)
Glucose, Bld: 106 mg/dL — ABNORMAL HIGH (ref 70–99)
Potassium: 4.3 mmol/L (ref 3.5–5.1)
Sodium: 141 mmol/L (ref 135–145)
Total Bilirubin: 0.7 mg/dL (ref 0.3–1.2)
Total Protein: 7.3 g/dL (ref 6.5–8.1)

## 2019-09-11 LAB — CBC WITH DIFFERENTIAL (CANCER CENTER ONLY)
Abs Immature Granulocytes: 0 10*3/uL (ref 0.00–0.07)
Band Neutrophils: 0 %
Basophils Absolute: 0 10*3/uL (ref 0.0–0.1)
Basophils Relative: 0 %
Blasts: 0 %
Eosinophils Absolute: 1 10*3/uL — ABNORMAL HIGH (ref 0.0–0.5)
Eosinophils Relative: 4 %
HCT: 35.5 % — ABNORMAL LOW (ref 36.0–46.0)
Hemoglobin: 11.3 g/dL — ABNORMAL LOW (ref 12.0–15.0)
Lymphocytes Relative: 73 %
Lymphs Abs: 17.4 10*3/uL — ABNORMAL HIGH (ref 0.7–4.0)
MCH: 30.7 pg (ref 26.0–34.0)
MCHC: 31.8 g/dL (ref 30.0–36.0)
MCV: 96.5 fL (ref 80.0–100.0)
Metamyelocytes Relative: 0 %
Monocytes Absolute: 0.2 10*3/uL (ref 0.1–1.0)
Monocytes Relative: 1 %
Myelocytes: 0 %
Neutro Abs: 5.3 10*3/uL (ref 1.7–7.7)
Neutrophils Relative %: 22 %
Other: 0 %
Platelet Count: <5 10*3/uL — CL (ref 150–400)
Promyelocytes Relative: 0 %
RBC: 3.68 MIL/uL — ABNORMAL LOW (ref 3.87–5.11)
RDW: 14.6 % (ref 11.5–15.5)
WBC Count: 23.9 10*3/uL — ABNORMAL HIGH (ref 4.0–10.5)
nRBC: 0 % (ref 0.0–0.2)
nRBC: 0 /100 WBC

## 2019-09-11 LAB — SAMPLE TO BLOOD BANK

## 2019-09-11 LAB — LACTATE DEHYDROGENASE: LDH: 210 U/L — ABNORMAL HIGH (ref 98–192)

## 2019-09-11 MED ORDER — SODIUM CHLORIDE 0.9 % IV SOLN
Freq: Once | INTRAVENOUS | Status: AC
  Filled 2019-09-11: qty 250

## 2019-09-11 MED ORDER — ACETAMINOPHEN 325 MG PO TABS
ORAL_TABLET | ORAL | Status: AC
  Filled 2019-09-11: qty 2

## 2019-09-11 MED ORDER — ACETAMINOPHEN 325 MG PO TABS
650.0000 mg | ORAL_TABLET | Freq: Once | ORAL | Status: AC
  Administered 2019-09-11: 650 mg via ORAL

## 2019-09-11 NOTE — Progress Notes (Signed)
Platelets less than 5, critical lab received form pharmacy.  PA Lucianne Lei aware.

## 2019-09-11 NOTE — Patient Instructions (Addendum)

## 2019-09-11 NOTE — Telephone Encounter (Signed)
Patient called office this morning (had  contacted after hours nurse triage at [phone] am this morning with same concerns): States she has lots of little red dots on her upper body and her legs and was advised on discharge from hospital last week to call office for any changes. She believes her platelets might be beginning to get low again. She states she was supposed to get platelets here last Wednesday, but did not need them.  Patient has appt with MD on Monday, but does not feel this should wait. She also states she has an itchy rash on her L side (tried OTC hydrocortisone with no relief) but does not know if it is related or not. Patient information/concerns shared with [facility] - scheduled for appts today: lab at 1:30pm and appt with PA in [facility] at 2 pm. Contacted patient with appt times - patient verbalized understanding of times of appts today stating she will be here then

## 2019-09-12 LAB — BPAM PLATELET PHERESIS
Blood Product Expiration Date: 202012312359
ISSUE DATE / TIME: 202012291604
Unit Type and Rh: 7300

## 2019-09-12 LAB — PREPARE PLATELET PHERESIS: Unit division: 0

## 2019-09-12 LAB — TYPE AND SCREEN
ABO/RH(D): O POS
Antibody Screen: POSITIVE
DAT, IgG: POSITIVE

## 2019-09-12 NOTE — Progress Notes (Signed)
[facility] Progress Note   Selena Branch [id number] [birth year]  78 y.o.  Selena Branch is managed by Dr. 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[facility]) - Plan: Practitioner attestation of consent, Complete patient signature process for consent form, Type and screen, Care order/instruction, Prepare Pheresed Platelets, Transfuse platelet pheresis, acetaminophen (TYLENOL) tablet 650 mg, Prepare Pheresed Platelets, Care order/instruction, Type and screen, Complete patient signature process for consent form, Practitioner attestation of consent, 0.9 %  sodium chloride infusion, Transfuse platelet pheresis  CLL (chronic lymphocytic leukemia) ([place])   ITP: Receiving chemotherapy today with a platelet count less than 5.  The patient was transfused with 1 pack of platelets.  She will return as scheduled to see Dr. Sullivan Lone on 09/17/2019.  Chronic lymphocytic leukemia: CBC returned today with a WBC of 23.9.  She will return to clinic as scheduled on 09/17/2019.  Please see After Visit Summary for patient specific instructions.  Future Appointments  Date Time Provider [place]  09/17/2019  3:00 PM [facility] 1 [facility] None  09/17/2019  3:20 PM Kale, Cloria [place], MD [facility] None    Orders Placed This Encounter  Procedures  . Practitioner attestation of consent  . Complete patient signature process for consent form  . Care order/instruction  . Type and screen  . Prepare Pheresed Platelets       Subjective:   Patient ID:  Selena Branch is a 78 y.o. (DOB 13-Feb-1941) female.  Chief Complaint:  Chief Complaint  Patient presents with  . Bleeding/Bruising    HPI Selena Branch  Is a 78 y.o. female with a diagnosis of a chronic lymphocytic leukemia.  She is managed by Dr. Irene Limbo and is scheduled to see him in follow-up on 09/17/2019.  She she also has a history of ITP and presents to the clinic  today with a report of petechiae over her hands and thighs bilaterally.  She denies weakness, dizziness, chest pain, shortness of breath, taxes, bright red blood per rectum, melena, hemoptysis or hematemesis.  Medications: I have reviewed the patient's current medications.  Allergies:  Allergies  Allergen Reactions  . Codeine Nausea Only    Past Medical History:  Diagnosis Date  . Anxiety   . CLL (chronic lymphocytic leukemia) ([place])   . Glaucoma   . Hyperlipemia   . Hypertension   . Osteoporosis     No past surgical history on file.  Family History  Problem Relation Age of Onset  . Stroke Mother   . Lung cancer Father     Social History   Socioeconomic History  . Marital status: Married    Spouse name: Not on file  . Number of children: 1  . Years of education: HS  . Highest education level: Not on file  Occupational History  . Occupation: Retired  Tobacco Use  . Smoking status: Never Smoker  . Smokeless tobacco: Never Used  Substance and Sexual Activity  . Alcohol use: No    Alcohol/week: 0.0 standard drinks  . Drug use: No  . Sexual activity: Not on file  Other Topics Concern  . Not on file  Social History Narrative   Drinks 2 cups of coffee   Social Determinants of Health   Financial Resource Strain:   . Difficulty of Paying Living Expenses: Not on file  Food Insecurity:   . Worried About Charity fundraiser in the Last Year: Not  on file  . Ran Out of Food in the Last Year: Not on file  Transportation Needs:   . Lack of Transportation (Medical): Not on file  . Lack of Transportation (Non-Medical): Not on file  Physical Activity:   . Days of Exercise per Week: Not on file  . Minutes of Exercise per Session: Not on file  Stress:   . Feeling of Stress : Not on file  Social Connections:   . Frequency of Communication with Friends and Family: Not on file  . Frequency of Social Gatherings with Friends and Family: Not on file  . Attends Religious  Services: Not on file  . Active Member of Clubs or Organizations: Not on file  . Attends Archivist Meetings: Not on file  . Marital Status: Not on file  Intimate Partner Violence:   . Fear of Current or Ex-Partner: Not on file  . Emotionally Abused: Not on file  . Physically Abused: Not on file  . Sexually Abused: Not on file    Past Medical History, Surgical history, Social history, and Family history were reviewed and updated as appropriate.   Please see review of systems for further details on the patient's review from today.   Review of Systems:  Review of Systems  Constitutional: Negative for chills, diaphoresis and fever.  HENT: Negative for facial swelling, nosebleeds and trouble swallowing.   Respiratory: Negative for cough, chest tightness and shortness of breath.   Cardiovascular: Negative for chest pain.  Gastrointestinal: Negative for anal bleeding and blood in stool.  Genitourinary: Negative for hematuria and vaginal bleeding.  Skin:       Petechiae over the bilateral hands and thighs.    Objective:   Physical Exam:  BP (!) 146/63   Pulse 74   Temp 98.4 F (36.9 C) (Oral)   Resp 16   Ht 4\' 10"  (1.473 m)   Wt 105 lb 14.4 oz (48 kg)   SpO2 100%   BMI 22.13 kg/m  ECOG: 0  Physical Exam Constitutional:      General: She is not in acute distress.    Appearance: Normal appearance. She is not ill-appearing, toxic-appearing or diaphoretic.  HENT:     Head: Normocephalic and atraumatic.  Eyes:     General: No scleral icterus.       Right eye: No discharge.        Left eye: No discharge.  Skin:    General: Skin is warm.     Findings: Rash present.     Comments: Petechial rash over the bilateral dorsal hands and forearms.  Neurological:     Mental Status: She is alert.     Coordination: Coordination normal.     Gait: Gait normal.  Psychiatric:        Mood and Affect: Mood normal.        Behavior: Behavior normal.        Thought Content:  Thought content normal.        Judgment: Judgment normal.     Lab Review:     Component Value Date/Time   NA 141 09/11/2019 1335   K 4.3 09/11/2019 1335   CL 109 09/11/2019 1335   CO2 25 09/11/2019 1335   GLUCOSE 106 (H) 09/11/2019 1335   BUN 24 (H) 09/11/2019 1335   CREATININE 0.63 09/11/2019 1335   CALCIUM 9.0 09/11/2019 1335   PROT 7.3 09/11/2019 1335   ALBUMIN 3.5 09/11/2019 1335   AST 29 09/11/2019 1335  ALT 25 09/11/2019 1335   ALKPHOS 64 09/11/2019 1335   BILITOT 0.7 09/11/2019 1335   GFRNONAA >60 09/11/2019 1335   GFRAA >60 09/11/2019 1335       Component Value Date/Time   WBC 23.9 (H) 09/11/2019 1335   WBC 56.9 (HH) 09/01/2019 0132   RBC 3.68 (L) 09/11/2019 1335   HGB 11.3 (L) 09/11/2019 1335   HCT 35.5 (L) 09/11/2019 1335   PLT <5 (LL) 09/11/2019 1335   MCV 96.5 09/11/2019 1335   MCH 30.7 09/11/2019 1335   MCHC 31.8 09/11/2019 1335   RDW 14.6 09/11/2019 1335   LYMPHSABS 17.4 (H) 09/11/2019 1335   MONOABS 0.2 09/11/2019 1335   EOSABS 1.0 (H) 09/11/2019 1335   BASOSABS 0.0 09/11/2019 1335   -------------------------------  Imaging from last 24 hours (if applicable):  Radiology interpretation: CT CHEST W CONTRAST  Result Date: 08/31/2019 CLINICAL DATA:  Inpatient. CLL, presenting for staging. New anemia and severe thrombocytopenia. EXAM: CT CHEST, ABDOMEN, AND PELVIS WITH CONTRAST TECHNIQUE: Multidetector CT imaging of the chest, abdomen and pelvis was performed following the standard protocol during bolus administration of intravenous contrast. CONTRAST:  114mL OMNIPAQUE IOHEXOL 300 MG/ML  SOLN COMPARISON:  05/07/2010 chest CT angiogram. FINDINGS: CT CHEST FINDINGS Cardiovascular: Normal heart size. No significant pericardial effusion/thickening. Left anterior descending coronary atherosclerosis. Atherosclerotic nonaneurysmal thoracic aorta. Normal caliber pulmonary arteries. No central pulmonary emboli. Mediastinum/Nodes: No discrete thyroid nodules.  Unremarkable esophagus. No pathologically enlarged axillary, mediastinal or hilar lymph nodes. Lungs/Pleura: No pneumothorax. No pleural effusion. No acute consolidative airspace disease or lung masses. A few scattered perifissural and subpleural solid pulmonary nodules in both lungs measuring up to 5 mm in the anterior right lower lobe (series 5/image 75), all stable since 05/07/2010 chest CT, considered benign. No new significant pulmonary nodules. Mild platelike scarring versus atelectasis at the dependent lung bases bilaterally. Musculoskeletal: No aggressive appearing focal osseous lesions. Mild thoracic spondylosis. Chronic mild posterior T10 vertebral compression deformity. CT ABDOMEN PELVIS FINDINGS Hepatobiliary: Normal liver with no liver mass. Normal gallbladder with no radiopaque cholelithiasis. No biliary ductal dilatation. Small periampullary duodenal diverticulum. Pancreas: Normal, with no mass or duct dilation. Spleen: Normal size. No mass. Adrenals/Urinary Tract: Normal adrenals. Normal kidneys with no hydronephrosis and no renal mass. Normal bladder. Stomach/Bowel: Small hiatal hernia. Otherwise normal nondistended stomach. Normal caliber small bowel with no small bowel wall thickening. Normal appendix. Oral contrast transits to the left colon. Moderate sigmoid diverticulosis, with no large bowel wall thickening or significant pericolonic fat stranding. Vascular/Lymphatic: Atherosclerotic nonaneurysmal abdominal aorta. Patent portal, splenic, hepatic and renal veins. Mildly enlarged 1.0 cm right inguinal node (series 3/image 105). Mild bilateral external iliac adenopathy up to 1.1 cm on the right (series 3/image 97) and 1.0 cm on the left (series 3/image 99). Mildly enlarged 1.2 cm left common iliac node (series 3/image 82). Left para-aortic adenopathy up to 1.2 cm (series 3/image 70). Aortocaval adenopathy up to 1.1 cm (series 3/image 73). Reproductive: Normal uterus. No right adnexal mass. Simple  5.3 cm left adnexal cyst (series 3/image 94). Other: No pneumoperitoneum, ascites or focal fluid collection. Musculoskeletal: No aggressive appearing focal osseous lesions. Moderate lower lumbar degenerative changes. IMPRESSION: 1. Mild multistation lymphadenopathy in the retroperitoneum and bilateral pelvis as detailed, compatible with lymphoma. Normal size spleen. No thoracic adenopathy. 2. One vessel coronary atherosclerosis. 3. Moderate sigmoid diverticulosis. 4. Simple 5.3 cm left adnexal cyst. Pelvic ultrasound evaluation recommended. This recommendation follows ACR consensus guidelines: White Paper of the ACR Incidental Findings Committee II  on Adnexal Findings. J Am Coll Radiol 2013:10:675-681. 5.  Aortic Atherosclerosis (ICD10-I70.0). Electronically Signed   By: Ilona Sorrel M.D.   On: 08/31/2019 12:14   CT ABDOMEN PELVIS W CONTRAST  Result Date: 08/31/2019 CLINICAL DATA:  Inpatient. CLL, presenting for staging. New anemia and severe thrombocytopenia. EXAM: CT CHEST, ABDOMEN, AND PELVIS WITH CONTRAST TECHNIQUE: Multidetector CT imaging of the chest, abdomen and pelvis was performed following the standard protocol during bolus administration of intravenous contrast. CONTRAST:  132mL OMNIPAQUE IOHEXOL 300 MG/ML  SOLN COMPARISON:  05/07/2010 chest CT angiogram. FINDINGS: CT CHEST FINDINGS Cardiovascular: Normal heart size. No significant pericardial effusion/thickening. Left anterior descending coronary atherosclerosis. Atherosclerotic nonaneurysmal thoracic aorta. Normal caliber pulmonary arteries. No central pulmonary emboli. Mediastinum/Nodes: No discrete thyroid nodules. Unremarkable esophagus. No pathologically enlarged axillary, mediastinal or hilar lymph nodes. Lungs/Pleura: No pneumothorax. No pleural effusion. No acute consolidative airspace disease or lung masses. A few scattered perifissural and subpleural solid pulmonary nodules in both lungs measuring up to 5 mm in the anterior right lower  lobe (series 5/image 75), all stable since 05/07/2010 chest CT, considered benign. No new significant pulmonary nodules. Mild platelike scarring versus atelectasis at the dependent lung bases bilaterally. Musculoskeletal: No aggressive appearing focal osseous lesions. Mild thoracic spondylosis. Chronic mild posterior T10 vertebral compression deformity. CT ABDOMEN PELVIS FINDINGS Hepatobiliary: Normal liver with no liver mass. Normal gallbladder with no radiopaque cholelithiasis. No biliary ductal dilatation. Small periampullary duodenal diverticulum. Pancreas: Normal, with no mass or duct dilation. Spleen: Normal size. No mass. Adrenals/Urinary Tract: Normal adrenals. Normal kidneys with no hydronephrosis and no renal mass. Normal bladder. Stomach/Bowel: Small hiatal hernia. Otherwise normal nondistended stomach. Normal caliber small bowel with no small bowel wall thickening. Normal appendix. Oral contrast transits to the left colon. Moderate sigmoid diverticulosis, with no large bowel wall thickening or significant pericolonic fat stranding. Vascular/Lymphatic: Atherosclerotic nonaneurysmal abdominal aorta. Patent portal, splenic, hepatic and renal veins. Mildly enlarged 1.0 cm right inguinal node (series 3/image 105). Mild bilateral external iliac adenopathy up to 1.1 cm on the right (series 3/image 97) and 1.0 cm on the left (series 3/image 99). Mildly enlarged 1.2 cm left common iliac node (series 3/image 82). Left para-aortic adenopathy up to 1.2 cm (series 3/image 70). Aortocaval adenopathy up to 1.1 cm (series 3/image 73). Reproductive: Normal uterus. No right adnexal mass. Simple 5.3 cm left adnexal cyst (series 3/image 94). Other: No pneumoperitoneum, ascites or focal fluid collection. Musculoskeletal: No aggressive appearing focal osseous lesions. Moderate lower lumbar degenerative changes. IMPRESSION: 1. Mild multistation lymphadenopathy in the retroperitoneum and bilateral pelvis as detailed, compatible  with lymphoma. Normal size spleen. No thoracic adenopathy. 2. One vessel coronary atherosclerosis. 3. Moderate sigmoid diverticulosis. 4. Simple 5.3 cm left adnexal cyst. Pelvic ultrasound evaluation recommended. This recommendation follows ACR consensus guidelines: White Paper of the ACR Incidental Findings Committee II on Adnexal Findings. J Am Coll Radiol 2013:10:675-681. 5.  Aortic Atherosclerosis (ICD10-I70.0). Electronically Signed   By: Ilona Sorrel M.D.   On: 08/31/2019 12:14   VAS [place] LOWER EXTREMITY VENOUS (DVT)  Result Date: 09/01/2019  Lower Venous Study Indications: Edema.  Comparison Study: no prior Performing Technologist: June Leap RDMS, RVT  Examination Guidelines: A complete evaluation includes B-mode imaging, spectral Doppler, color Doppler, and power Doppler as needed of all accessible portions of each vessel. Bilateral testing is considered an integral part of a complete examination. Limited examinations for reoccurring indications may be performed as noted.  +-----+---------------+---------+-----------+----------+--------------+ RIGHTCompressibilityPhasicitySpontaneityPropertiesThrombus Aging +-----+---------------+---------+-----------+----------+--------------+ CFV  Full  Yes      Yes                                 +-----+---------------+---------+-----------+----------+--------------+   +---------+---------------+---------+-----------+----------+--------------+ LEFT     CompressibilityPhasicitySpontaneityPropertiesThrombus Aging +---------+---------------+---------+-----------+----------+--------------+ CFV      Full           Yes      Yes                                 +---------+---------------+---------+-----------+----------+--------------+ SFJ      Full                                                        +---------+---------------+---------+-----------+----------+--------------+ FV Prox  Full                                                         +---------+---------------+---------+-----------+----------+--------------+ FV Mid   Full                                                        +---------+---------------+---------+-----------+----------+--------------+ FV DistalFull                                                        +---------+---------------+---------+-----------+----------+--------------+ PFV      Full                                                        +---------+---------------+---------+-----------+----------+--------------+ POP      Full           Yes      Yes                                 +---------+---------------+---------+-----------+----------+--------------+ PTV      Full                                                        +---------+---------------+---------+-----------+----------+--------------+ PERO     Full                                                        +---------+---------------+---------+-----------+----------+--------------+       Summary: Right: No evidence of common femoral vein obstruction. Left: There is no evidence of deep vein thrombosis in the lower extremity. No cystic structure found in the popliteal fossa.  *See table(s) above for measurements and observations. Electronically signed by Harold Barban MD on 09/01/2019 at 4:38:59 PM.    Final

## 2019-09-13 ENCOUNTER — Other Ambulatory Visit: Payer: Self-pay | Admitting: *Deleted

## 2019-09-13 DIAGNOSIS — D649 Anemia, unspecified: Secondary | ICD-10-CM

## 2019-09-13 DIAGNOSIS — C9110 Chronic lymphocytic leukemia of B-cell type not having achieved remission: Secondary | ICD-10-CM

## 2019-09-13 DIAGNOSIS — D693 Immune thrombocytopenic purpura: Secondary | ICD-10-CM

## 2019-09-15 ENCOUNTER — Encounter (HOSPITAL_COMMUNITY): Payer: Self-pay | Admitting: Emergency Medicine

## 2019-09-15 ENCOUNTER — Other Ambulatory Visit: Payer: Self-pay

## 2019-09-15 ENCOUNTER — Inpatient Hospital Stay (HOSPITAL_COMMUNITY)
Admission: EM | Admit: 2019-09-15 | Discharge: 2019-09-20 | DRG: 840 | Disposition: A | Discharge status: Home / Self Care | Payer: PPO | Attending: Internal Medicine | Admitting: Internal Medicine

## 2019-09-15 DIAGNOSIS — Z03818 Encounter for observation for suspected exposure to other biological agents ruled out: Secondary | ICD-10-CM | POA: Diagnosis not present

## 2019-09-15 DIAGNOSIS — C9112 Chronic lymphocytic leukemia of B-cell type in relapse: Principal | ICD-10-CM | POA: Diagnosis present

## 2019-09-15 DIAGNOSIS — C9110 Chronic lymphocytic leukemia of B-cell type not having achieved remission: Secondary | ICD-10-CM

## 2019-09-15 DIAGNOSIS — M810 Age-related osteoporosis without current pathological fracture: Secondary | ICD-10-CM | POA: Diagnosis present

## 2019-09-15 DIAGNOSIS — H409 Unspecified glaucoma: Secondary | ICD-10-CM | POA: Diagnosis present

## 2019-09-15 DIAGNOSIS — G9340 Encephalopathy, unspecified: Secondary | ICD-10-CM

## 2019-09-15 DIAGNOSIS — Z20822 Contact with and (suspected) exposure to covid-19: Secondary | ICD-10-CM | POA: Diagnosis present

## 2019-09-15 DIAGNOSIS — E785 Hyperlipidemia, unspecified: Secondary | ICD-10-CM | POA: Diagnosis present

## 2019-09-15 DIAGNOSIS — I10 Essential (primary) hypertension: Secondary | ICD-10-CM | POA: Diagnosis not present

## 2019-09-15 DIAGNOSIS — Z801 Family history of malignant neoplasm of trachea, bronchus and lung: Secondary | ICD-10-CM | POA: Diagnosis not present

## 2019-09-15 DIAGNOSIS — Z66 Do not resuscitate: Secondary | ICD-10-CM | POA: Diagnosis not present

## 2019-09-15 DIAGNOSIS — R21 Rash and other nonspecific skin eruption: Secondary | ICD-10-CM | POA: Diagnosis present

## 2019-09-15 DIAGNOSIS — D6959 Other secondary thrombocytopenia: Secondary | ICD-10-CM | POA: Diagnosis not present

## 2019-09-15 DIAGNOSIS — J9601 Acute respiratory failure with hypoxia: Secondary | ICD-10-CM | POA: Diagnosis not present

## 2019-09-15 DIAGNOSIS — E871 Hypo-osmolality and hyponatremia: Secondary | ICD-10-CM | POA: Diagnosis not present

## 2019-09-15 DIAGNOSIS — K068 Other specified disorders of gingiva and edentulous alveolar ridge: Secondary | ICD-10-CM | POA: Diagnosis not present

## 2019-09-15 DIAGNOSIS — D693 Immune thrombocytopenic purpura: Secondary | ICD-10-CM | POA: Diagnosis present

## 2019-09-15 DIAGNOSIS — I611 Nontraumatic intracerebral hemorrhage in hemisphere, cortical: Secondary | ICD-10-CM | POA: Diagnosis not present

## 2019-09-15 DIAGNOSIS — I609 Nontraumatic subarachnoid hemorrhage, unspecified: Secondary | ICD-10-CM | POA: Diagnosis not present

## 2019-09-15 DIAGNOSIS — D689 Coagulation defect, unspecified: Secondary | ICD-10-CM | POA: Diagnosis not present

## 2019-09-15 DIAGNOSIS — D649 Anemia, unspecified: Secondary | ICD-10-CM

## 2019-09-15 DIAGNOSIS — Z823 Family history of stroke: Secondary | ICD-10-CM | POA: Diagnosis not present

## 2019-09-15 DIAGNOSIS — I639 Cerebral infarction, unspecified: Secondary | ICD-10-CM | POA: Diagnosis not present

## 2019-09-15 DIAGNOSIS — R7989 Other specified abnormal findings of blood chemistry: Secondary | ICD-10-CM | POA: Diagnosis present

## 2019-09-15 DIAGNOSIS — G936 Cerebral edema: Secondary | ICD-10-CM | POA: Diagnosis present

## 2019-09-15 DIAGNOSIS — I629 Nontraumatic intracranial hemorrhage, unspecified: Secondary | ICD-10-CM | POA: Diagnosis not present

## 2019-09-15 DIAGNOSIS — I619 Nontraumatic intracerebral hemorrhage, unspecified: Secondary | ICD-10-CM | POA: Insufficient documentation

## 2019-09-15 DIAGNOSIS — R4701 Aphasia: Secondary | ICD-10-CM | POA: Diagnosis not present

## 2019-09-15 DIAGNOSIS — Z298 Encounter for other specified prophylactic measures: Secondary | ICD-10-CM | POA: Diagnosis not present

## 2019-09-15 DIAGNOSIS — Z79899 Other long term (current) drug therapy: Secondary | ICD-10-CM

## 2019-09-15 DIAGNOSIS — Z885 Allergy status to narcotic agent status: Secondary | ICD-10-CM

## 2019-09-15 DIAGNOSIS — F419 Anxiety disorder, unspecified: Secondary | ICD-10-CM | POA: Diagnosis not present

## 2019-09-15 DIAGNOSIS — G939 Disorder of brain, unspecified: Secondary | ICD-10-CM | POA: Diagnosis present

## 2019-09-15 DIAGNOSIS — E876 Hypokalemia: Secondary | ICD-10-CM | POA: Diagnosis not present

## 2019-09-15 DIAGNOSIS — F329 Major depressive disorder, single episode, unspecified: Secondary | ICD-10-CM | POA: Diagnosis present

## 2019-09-15 DIAGNOSIS — R4182 Altered mental status, unspecified: Secondary | ICD-10-CM | POA: Diagnosis present

## 2019-09-15 DIAGNOSIS — R040 Epistaxis: Secondary | ICD-10-CM | POA: Diagnosis present

## 2019-09-15 DIAGNOSIS — D696 Thrombocytopenia, unspecified: Secondary | ICD-10-CM

## 2019-09-15 DIAGNOSIS — I739 Peripheral vascular disease, unspecified: Secondary | ICD-10-CM | POA: Diagnosis present

## 2019-09-15 LAB — BASIC METABOLIC PANEL
Anion gap: 9 (ref 5–15)
BUN: 18 mg/dL (ref 8–23)
CO2: 24 mmol/L (ref 22–32)
Calcium: 9.1 mg/dL (ref 8.9–10.3)
Chloride: 107 mmol/L (ref 98–111)
Creatinine, Ser: 0.81 mg/dL (ref 0.44–1.00)
GFR calc Af Amer: >60 mL/min (ref >60)
GFR calc non Af Amer: >60 mL/min (ref >60)
Glucose, Bld: 138 mg/dL — ABNORMAL HIGH (ref 70–99)
Potassium: 3.8 mmol/L (ref 3.5–5.1)
Sodium: 140 mmol/L (ref 135–145)

## 2019-09-15 LAB — CBC
HCT: 33.2 % — ABNORMAL LOW (ref 36.0–46.0)
Hemoglobin: 11 g/dL — ABNORMAL LOW (ref 12.0–15.0)
MCH: 30.7 pg (ref 26.0–34.0)
MCHC: 33.1 g/dL (ref 30.0–36.0)
MCV: 92.7 fL (ref 80.0–100.0)
Platelets: <5 10*3/uL — CL (ref 150–400)
RBC: 3.58 MIL/uL — ABNORMAL LOW (ref 3.87–5.11)
RDW: 14 % (ref 11.5–15.5)
WBC: 20.2 10*3/uL — ABNORMAL HIGH (ref 4.0–10.5)
nRBC: 0 % (ref 0.0–0.2)

## 2019-09-15 LAB — CBC WITH DIFFERENTIAL/PLATELET
Abs Immature Granulocytes: 0 10*3/uL (ref 0.00–0.07)
Basophils Absolute: 0 10*3/uL (ref 0.0–0.1)
Basophils Relative: 0 %
Eosinophils Absolute: 0 10*3/uL (ref 0.0–0.5)
Eosinophils Relative: 0 %
HCT: 34.4 % — ABNORMAL LOW (ref 36.0–46.0)
Hemoglobin: 11 g/dL — ABNORMAL LOW (ref 12.0–15.0)
Lymphocytes Relative: 74 %
Lymphs Abs: 13.6 10*3/uL — ABNORMAL HIGH (ref 0.7–4.0)
MCH: 31 pg (ref 26.0–34.0)
MCHC: 32 g/dL (ref 30.0–36.0)
MCV: 96.9 fL (ref 80.0–100.0)
Monocytes Absolute: 0.6 10*3/uL (ref 0.1–1.0)
Monocytes Relative: 3 %
Neutro Abs: 4.2 10*3/uL (ref 1.7–7.7)
Neutrophils Relative %: 23 %
Platelets: <5 10*3/uL — CL (ref 150–400)
RBC: 3.55 MIL/uL — ABNORMAL LOW (ref 3.87–5.11)
RDW: 14.2 % (ref 11.5–15.5)
WBC: 18.4 10*3/uL — ABNORMAL HIGH (ref 4.0–10.5)
nRBC: 0 % (ref 0.0–0.2)

## 2019-09-15 LAB — LACTATE DEHYDROGENASE: LDH: 276 U/L — ABNORMAL HIGH (ref 98–192)

## 2019-09-15 LAB — HEPATIC FUNCTION PANEL
ALT: 22 U/L (ref 0–44)
AST: 35 U/L (ref 15–41)
Albumin: 3.5 g/dL (ref 3.5–5.0)
Alkaline Phosphatase: 53 U/L (ref 38–126)
Bilirubin, Direct: 0.1 mg/dL (ref 0.0–0.2)
Indirect Bilirubin: 0.7 mg/dL (ref 0.3–0.9)
Total Bilirubin: 0.8 mg/dL (ref 0.3–1.2)
Total Protein: 6.7 g/dL (ref 6.5–8.1)

## 2019-09-15 LAB — RESPIRATORY PANEL BY RT PCR (FLU A&B, COVID)
Influenza A by PCR: NEGATIVE
Influenza B by PCR: NEGATIVE
SARS Coronavirus 2 by RT PCR: NEGATIVE

## 2019-09-15 LAB — APTT: aPTT: 24 seconds (ref 24–36)

## 2019-09-15 LAB — PROTIME-INR
INR: 1 (ref 0.8–1.2)
Prothrombin Time: 12.6 seconds (ref 11.4–15.2)

## 2019-09-15 MED ORDER — LATANOPROST 0.005 % OP SOLN
1.0000 [drp] | Freq: Every day | OPHTHALMIC | Status: DC
  Administered 2019-09-15 – 2019-09-19 (×5): 1 [drp] via OPHTHALMIC
  Filled 2019-09-15: qty 2.5

## 2019-09-15 MED ORDER — ONDANSETRON HCL 4 MG PO TABS: 4.0000 mg | ORAL_TABLET | Freq: Four times a day (QID) | ORAL | Status: DC | PRN

## 2019-09-15 MED ORDER — QUINAPRIL HCL 10 MG PO TABS
40.0000 mg | ORAL_TABLET | Freq: Every day | ORAL | Status: DC
  Administered 2019-09-16: 40 mg via ORAL
  Filled 2019-09-15: qty 4

## 2019-09-15 MED ORDER — SERTRALINE HCL 50 MG PO TABS
25.0000 mg | ORAL_TABLET | Freq: Every day | ORAL | Status: DC
  Administered 2019-09-16: 25 mg via ORAL
  Filled 2019-09-15: qty 1

## 2019-09-15 MED ORDER — DOCUSATE SODIUM 100 MG PO CAPS
100.0000 mg | ORAL_CAPSULE | Freq: Two times a day (BID) | ORAL | Status: DC
  Filled 2019-09-15 (×2): qty 1

## 2019-09-15 MED ORDER — HYDRALAZINE HCL 20 MG/ML IJ SOLN
5.0000 mg | INTRAMUSCULAR | Status: DC | PRN
  Administered 2019-09-15: 17:00:00 5 mg via INTRAVENOUS
  Filled 2019-09-15: qty 1

## 2019-09-15 MED ORDER — FUROSEMIDE 20 MG PO TABS
20.0000 mg | ORAL_TABLET | Freq: Every day | ORAL | Status: DC
  Administered 2019-09-16: 20 mg via ORAL
  Filled 2019-09-15: qty 1

## 2019-09-15 MED ORDER — ONDANSETRON HCL 4 MG/2ML IJ SOLN
4.0000 mg | Freq: Four times a day (QID) | INTRAMUSCULAR | Status: DC | PRN
  Administered 2019-09-16: 4 mg via INTRAVENOUS
  Filled 2019-09-15: qty 2

## 2019-09-15 MED ORDER — DORZOLAMIDE HCL-TIMOLOL MAL 2-0.5 % OP SOLN
1.0000 [drp] | Freq: Every day | OPHTHALMIC | Status: DC
  Administered 2019-09-16 – 2019-09-20 (×5): 1 [drp] via OPHTHALMIC
  Filled 2019-09-15: qty 10

## 2019-09-15 MED ORDER — ACETAMINOPHEN 650 MG RE SUPP: 650.0000 mg | Freq: Four times a day (QID) | RECTAL | Status: DC | PRN

## 2019-09-15 MED ORDER — OXYMETAZOLINE HCL 0.05 % NA SOLN
2.0000 | Freq: Once | NASAL | Status: AC
  Administered 2019-09-15: 10:00:00 2 via NASAL
  Filled 2019-09-15: qty 30

## 2019-09-15 MED ORDER — AMLODIPINE BESYLATE 5 MG PO TABS
5.0000 mg | ORAL_TABLET | Freq: Every day | ORAL | Status: DC
  Administered 2019-09-15: 5 mg via ORAL
  Filled 2019-09-15: qty 1

## 2019-09-15 MED ORDER — ATORVASTATIN CALCIUM 40 MG PO TABS
40.0000 mg | ORAL_TABLET | Freq: Every day | ORAL | Status: DC
  Administered 2019-09-16: 40 mg via ORAL
  Filled 2019-09-15: qty 1

## 2019-09-15 MED ORDER — PREDNISONE 20 MG PO TABS
50.0000 mg | ORAL_TABLET | Freq: Once | ORAL | Status: AC
  Administered 2019-09-15: 14:00:00 50 mg via ORAL
  Filled 2019-09-15 (×2): qty 1

## 2019-09-15 MED ORDER — ACETAMINOPHEN 325 MG PO TABS: 650.0000 mg | ORAL_TABLET | Freq: Four times a day (QID) | ORAL | Status: DC | PRN

## 2019-09-15 NOTE — ED Provider Notes (Addendum)
[facility] Provider Note   CSN: [id number] Arrival date & time: 09/15/19  0800     History Chief Complaint  Patient presents with  . Rash  . Coagulation Disorder    gum and nose bleeding    Selena Branch is a 79 y.o. female.  HPI   Patient is a 79 year old female with a history of anxiety, CLL, hyperlipidemia, hypertension, osteoporosis, ITP, who presents to the emergency department today for evaluation of epistaxis and rash.   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  She states that she had a platelet infusion last week on 12/29 that she felt like she did not react well to.  She states that yesterday she noticed she was having some bleeding from the left nares as well as bleeding to her gums.  She does report that she had a petechial rash to the arms and legs last week but this morning she woke up and the rash was worse and she has continued to have bleeding from her gums and nose.  She is also had some worse bruising this week.  She also reports some nausea but denies any vomiting, hematemesis bloody stools, vaginal bleeding or bleeding elsewhere.  She denies any fevers, chills, chest pain, shortness of breath, headaches, vision changes, numbness/weakness.  She is not on any blood thinning medication.  Past Medical History:  Diagnosis Date  . Anxiety   . 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 Glaucoma   . Hyperlipemia   . Hypertension   . Osteoporosis     Patient Active Problem List   Diagnosis Date Noted  . Acute ITP ([place])   . Thrombocytopenia ([place]) 08/30/2019  . Anemia   . CLL (chronic lymphocytic leukemia) ([place])     History reviewed. No pertinent surgical history.   OB History   No obstetric history on file.     Family History  Problem Relation Age of Onset  . Stroke Mother   . Lung cancer Father   . Leukemia  Neg Hx     Social History   Tobacco Use  . Smoking status: Never Smoker  . Smokeless tobacco: Never Used  Substance Use Topics  . Alcohol use: No    Alcohol/week: 0.0 standard drinks  . Drug use: No    Home Medications Prior to Admission medications   Medication Sig Start Date End Date Taking? Authorizing Provider  amLODipine (NORVASC) 5 MG tablet Take 5 mg by mouth at bedtime.  11/11/15   [provider]  atorvastatin (LIPITOR) 40 MG tablet Take 40 mg by mouth daily.     [provider]  Cholecalciferol (VITAMIN D3) 2000 units TABS Take 2,000 Units by mouth daily.     [provider]  dorzolamide-timolol (COSOPT) 22.3-6.8 MG/ML ophthalmic solution Place 1 drop into both eyes daily.  12/10/15   [provider]  furosemide (LASIX) 20 MG tablet Take 40 mg for 3 days then resume your 20 mg daily 09/01/19   Eulogio Bear U, DO  latanoprost (XALATAN) 0.005 % ophthalmic solution Place 1 drop into both eyes at bedtime.  12/10/15   [provider]  Multiple Vitamin (MULTIVITAMIN) capsule Take 1 capsule by mouth daily.    [provider]  quinapril (ACCUPRIL) 40 MG tablet Take 40 mg by mouth daily.     [provider]  sertraline (ZOLOFT) 25 MG tablet Take 25 mg by mouth  daily.  11/28/15   [provider]  SIMBRINZA 1-0.2 % SUSP Place 1 drop into both eyes 2 (two) times daily. 08/17/19   [provider]    Allergies    Codeine  Review of Systems   Review of Systems  Constitutional: Negative for chills and fever.  HENT: Negative for ear pain and sore throat.        Bleeding gums, epistaxis  Eyes: Negative for visual disturbance.  Respiratory: Negative for cough and shortness of breath.   Cardiovascular: Negative for chest pain and palpitations.  Gastrointestinal: Negative for abdominal pain and vomiting.  Genitourinary: Negative for dysuria and hematuria.  Musculoskeletal: Negative for arthralgias and back  pain.  Skin: Negative for color change and rash.  Neurological: Negative for seizures, syncope, weakness, numbness and headaches.  All other systems reviewed and are negative.   Physical Exam Updated Vital Signs BP (!) 170/59   Pulse 84   Temp 97.9 F (36.6 C) (Oral)   Resp 16   SpO2 96%   Physical Exam Vitals and nursing note reviewed.  Constitutional:      General: She is not in acute distress.    Appearance: She is well-developed.  HENT:     Head: Normocephalic and atraumatic.     Mouth/Throat:     Comments: Small amount of bleeding from the left upper gums and anterior lower gums. Small area of ecchymosis and swelling to left upper lip, lower lip and anterior tongue where pt previously bit  Eyes:     Extraocular Movements: Extraocular movements intact.     Conjunctiva/sclera: Conjunctivae normal.     Comments: bilat pupils round and reactive to light. Left pupil is slightly larger than left (baseline per pt)  Cardiovascular:     Rate and Rhythm: Normal rate and regular rhythm.     Heart sounds: Normal heart sounds. No murmur.  Pulmonary:     Effort: Pulmonary effort is normal. No respiratory distress.     Breath sounds: Normal breath sounds. No wheezing, rhonchi or rales.  Abdominal:     General: Bowel sounds are normal.     Palpations: Abdomen is soft.     Tenderness: There is no abdominal tenderness. There is no guarding or rebound.  Musculoskeletal:     Cervical back: Neck supple.  Skin:    General: Skin is warm and dry.     Findings: Rash (petechial rashes to the BUE, BLE and trunk) present.     Comments: Multiple bruises to the skin in various stages of healing  Neurological:     Mental Status: She is alert.     Comments: Mental Status:  Alert, thought content appropriate, able to give a coherent history. Speech fluent without evidence of aphasia. Able to follow 2 step commands without difficulty.  Cranial Nerves:  II:  See eye exam III,IV, VI: ptosis not  present, extra-ocular motions intact bilaterally  V,VII: smile symmetric, facial light touch sensation equal VIII: hearing grossly normal to voice  X: uvula elevates symmetrically  XI: bilateral shoulder shrug symmetric and strong XII: midline tongue extension without fassiculations Motor:  Normal tone. 5/5 strength of BUE and BLE major muscle groups including strong and equal grip strength and dorsiflexion/plantar flexion Sensory: light touch normal in all extremities.      ED Results / Procedures / Treatments   Labs (all labs ordered are listed, but only abnormal results are displayed) Labs Reviewed  CBC WITH DIFFERENTIAL/PLATELET - Abnormal; Notable for the following components:  Result Value   WBC 18.4 (*)    RBC 3.55 (*)    Hemoglobin 11.0 (*)    HCT 34.4 (*)    Platelets <5 (*)    Lymphs Abs 13.6 (*)    All other components within normal limits  BASIC METABOLIC PANEL - Abnormal; Notable for the following components:   Glucose, Bld 138 (*)    All other components within normal limits  APTT  PROTIME-INR  HEPATIC FUNCTION PANEL  LACTATE DEHYDROGENASE  CBC  TYPE AND SCREEN    EKG None  Radiology No results found.  Procedures Procedures (including critical care time) CRITICAL CARE Performed by: Rodney Booze   Total critical care time: 31 minutes  Critical care time was exclusive of separately billable procedures and treating other patients.  Critical care was necessary to treat or prevent imminent or life-threatening deterioration.  Critical care was time spent personally by me on the following activities: development of treatment plan with patient and/or surrogate as well as nursing, discussions with consultants, evaluation of patient's response to treatment, examination of patient, obtaining history from patient or surrogate, ordering and performing treatments and interventions, ordering and review of laboratory studies, ordering and review of  radiographic studies, pulse oximetry and re-evaluation of patient's condition.   Medications Ordered in ED Medications  predniSONE (DELTASONE) tablet 50 mg (has no administration in time range)  amLODipine (NORVASC) tablet 5 mg (has no administration in time range)  atorvastatin (LIPITOR) tablet 40 mg (has no administration in time range)  furosemide (LASIX) tablet 20 mg (has no administration in time range)  quinapril (ACCUPRIL) tablet 40 mg (has no administration in time range)  sertraline (ZOLOFT) tablet 25 mg (has no administration in time range)  dorzolamide-timolol (COSOPT) 22.3-6.8 MG/ML ophthalmic solution 1 drop (has no administration in time range)  latanoprost (XALATAN) 0.005 % ophthalmic solution 1 drop (has no administration in time range)  acetaminophen (TYLENOL) tablet 650 mg (has no administration in time range)    Or  acetaminophen (TYLENOL) suppository 650 mg (has no administration in time range)  docusate sodium (COLACE) capsule 100 mg (has no administration in time range)  ondansetron (ZOFRAN) tablet 4 mg (has no administration in time range)    Or  ondansetron (ZOFRAN) injection 4 mg (has no administration in time range)  oxymetazoline (AFRIN) 0.05 % nasal spray 2 spray (2 sprays Each Nare Given 09/15/19 1005)    ED Course  I have reviewed the triage vital signs and the nursing notes.  Pertinent labs & imaging results that were available during my care of the patient were reviewed by me and considered in my medical decision making (see chart for details).    MDM Rules/Calculators/A&P                     79 y/o F with a h/o CLL and recent dx of ITP presenting with bleeding gums, epistaxis and worsening petechial rash.   Reviewed records, pt had labs 12/29 with plt <5. She received plt last week but has persistent sxs.  Initially tachycardic and hypertensive, otherwise normal VS. On my eval no longer tachycardic. BP improving to 180s (took BP meds shortly PTA).    CBC with leukocytosis at 18.4, decreasing from prior.  Anemia present but at baseline.  Thrombocytopenia with platelet count less than 5. BMP nonacute Hepatic function panel within normal limits PTINR wnl APTT wnl  Pt given afrin for her nosebleed and on reassessment she was no longer bleeding.  11:25 PM CONSULT With Dr. Earlie Server with oncology who recommends giving 1 mg/kg of prednisone and adding and LDH. He recommends admission and monitoring patient response prior to any other interventions.   12:23 PM CONSULT with Dr. Lorin Mercy who accepts patient for admission.   Final Clinical Impression(s) / ED Diagnoses Final diagnoses:  Acute ITP [facility])    Rx / DC Orders ED Discharge Orders    None       Rodney Booze, PA-C 09/15/19 1341    Frankye Schwegel S, PA-C 09/15/19 1342    Pattricia Boss, MD 09/16/19 1458

## 2019-09-15 NOTE — ED Notes (Signed)
ED Provider at bedside. 

## 2019-09-15 NOTE — Progress Notes (Signed)
Texted paged BP results to DR. Yates 198/66. Will continue to monitor patient

## 2019-09-15 NOTE — ED Notes (Signed)
Blood bank contacted this RN and stated that patient has positive antibodies and that it will take longer to match her for a transfusion if one is needed. PA Couture made aware.

## 2019-09-15 NOTE — ED Notes (Signed)
DNR band placed.

## 2019-09-15 NOTE — Progress Notes (Signed)
Selena Branch is a 79 y.o. female patient admitted. Awake, alert - oriented  X 4 - no acute distress noted.  VSS - Blood pressure (Abnormal) 198/66, pulse 100, temperature 98.5 F (36.9 C), temperature source Oral, resp. rate 17, height 4\' 10"  (1.473 m), weight 47.7 kg, SpO2 99 %.    IV in place, occlusive dsg intact without redness.  Orientation to room, and floor completed.  Admission INP armband ID verified with patient/family, and in place.   SR up x 2, fall assessment complete, with patient able to verbalize understanding of risk associated with falls, and verbalized understanding to call nsg before up out of bed.  Call light within reach, patient able to voice, and demonstrate understanding.      Will cont to eval and treat per MD orders.  Theora Master, RN 09/15/2019 4:17 PM

## 2019-09-15 NOTE — ED Triage Notes (Signed)
Pt states she has leukemia and received platelets on Tuesday.  Bleeding to gums and nose since yesterday.  Woke up this morning with petechiae to bilateral legs.

## 2019-09-15 NOTE — ED Notes (Signed)
Patient's daughter/POA cindy rager [phone]) updated by PA couture and would like updates as plan of care is decided.

## 2019-09-15 NOTE — H&P (Addendum)
History and Physical    Selena Branch ZOX:[id number] DOB: 04-Aug-1941 DOA: 09/15/2019  PCP: [name], MD Consultants:  Irene Limbo - oncology  Patient coming from:  Home - lives with husband Marcello Moores, has dementia); NOK: Daughter, [name], [phone]   Chief Complaint: Recurrent acute ITP   HPI: Selena Branch is a 79 y.o. female with medical history significant of HTN; HLD; CLL; and glaucoma presenting with bleeding of gums and nose, petechiae.  She was diagnosed with CLL in March and was going into to the [facility] for ongoing monitoring every 3 months.  She was due to go Monday to develop a treatment plan and things went crazy on Monday.  Patient was last admitted from 12/17-19 with thrombocytopenia associated with CLL; oncology was consulted and she was treated with Dexamethasone and IVIG.  Platelets were 63 at the time of d/c and she was recommended to have a BM biopsy the following week.  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  Clearly, her blood did not like the platelets and she had to come back.  She was given platelets Tuesday noticed bleeding from the gums and nose Thursday-Friday and then developed diffuse petechiae.  Things were worse this AM and she "realized I really had an issue."  Her prior hospitalization kept her without symptoms through 12/29.  No falls.  No headaches.   ED Course:  H/o CLL without treatment yet.  ITP earlier this week, recurrent bleeding none active).  Platelets <5.  Dr. Earlie Server suggests 1 mg/kg prednisone and follow.   Review of Systems: As per HPI; otherwise review of systems reviewed and negative.    Ambulatory Status:  Ambulates without assistance  Past Medical History:  Diagnosis Date  . Anxiety   . 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 Glaucoma   . Hyperlipemia   . Hypertension   . Osteoporosis      History reviewed. No pertinent surgical history.  Social History   Socioeconomic History  . Marital status: Married    Spouse name: Not on file  . Number of children: 1  . Years of education: HS  . Highest education level: Not on file  Occupational History  . Occupation: Retired  Tobacco Use  . Smoking status: Never Smoker  . Smokeless tobacco: Never Used  Substance and Sexual Activity  . Alcohol use: No    Alcohol/week: 0.0 standard drinks  . Drug use: No  . Sexual activity: Not on file  Other Topics Concern  . Not on file  Social History Narrative   Drinks 2 cups of coffee   Social Determinants of Health   Financial Resource Strain:   . Difficulty of Paying Living Expenses: Not on file  Food Insecurity:   . Worried About Charity fundraiser in the Last Year: Not on file  . Ran Out of Food in the Last Year: Not on file  Transportation Needs:   . Lack of Transportation (Medical): Not on file  . Lack of Transportation (Non-Medical): Not on file  Physical Activity:   . Days of Exercise per Week: Not on file  . Minutes of Exercise per Session: Not on file  Stress:   . Feeling of Stress : Not on file  Social Connections:   . Frequency of Communication with Friends and Family: Not on file  . Frequency  of Social Gatherings with Friends and Family: Not on file  . Attends Religious Services: Not on file  . Active Member of Clubs or Organizations: Not on file  . Attends Archivist Meetings: Not on file  . Marital Status: Not on file  Intimate Partner Violence:   . Fear of Current or Ex-Partner: Not on file  . Emotionally Abused: Not on file  . Physically Abused: Not on file  . Sexually Abused: Not on file    Allergies  Allergen Reactions  . Codeine Nausea Only    Family History  Problem Relation Age of Onset  . Stroke Mother   . Lung cancer Father   . Leukemia Neg Hx     Prior to Admission medications   Medication Sig Start Date End Date  Taking? Authorizing Provider  amLODipine (NORVASC) 5 MG tablet Take 5 mg by mouth at bedtime.  11/11/15   [provider]  atorvastatin (LIPITOR) 40 MG tablet Take 40 mg by mouth daily.     [provider]  Cholecalciferol (VITAMIN D3) 2000 units TABS Take 2,000 Units by mouth daily.     [provider]  dexamethasone (DECADRON) 4 MG tablet Dexamethasone 52m po daily x 1 day then 16 mg x 1 day then 12 mg x 1 day then 8 mg x 1 day then 4 mg x 1 day then stop 09/02/19   VEulogio BearU, DO  dorzolamide-timolol (COSOPT) 22.3-6.8 MG/ML ophthalmic solution Place 1 drop into both eyes daily.  12/10/15   [provider]  furosemide (LASIX) 20 MG tablet Take 40 mg for 3 days then resume your 20 mg daily 09/01/19   VEulogio BearU, DO  latanoprost (XALATAN) 0.005 % ophthalmic solution Place 1 drop into both eyes at bedtime.  12/10/15   [provider]  Multiple Vitamin (MULTIVITAMIN) capsule Take 1 capsule by mouth daily.    [provider]  quinapril (ACCUPRIL) 40 MG tablet Take 40 mg by mouth daily.     [provider]  sertraline (ZOLOFT) 25 MG tablet Take 25 mg by mouth daily.  11/28/15   [provider]  SIMBRINZA 1-0.2 % SUSP Place 1 drop into both eyes 2 (two) times daily. 08/17/19   [provider]    Physical Exam: Vitals:   09/15/19 1245 09/15/19 1330 09/15/19 1430 09/15/19 1609  BP: (!) 170/59 (!) 157/63 (!) 159/66 (!) 198/66  Pulse: 84 79 78 100  Resp:    17  Temp:    98.5 F (36.9 C)  TempSrc:    Oral  SpO2: 96% 97% 97% 99%  Weight:    47.7 kg  Height:    _0  (1.473 m)     . General:  Appears calm and comfortable and is NAD . Eyes:  PERRL, EOMI, normal lids, small capillary breaks with subchorionic hemorrhages (minimal) . ENT:  grossly normal hearing, lips, mmm; petechiae along her palate with evidence of mucosal/gingival bleeding . Neck:  no LAD, masses or thyromegaly . Cardiovascular:  RRR, no  m/r/g. No LE edema.  .Marland KitchenRespiratory:   CTA bilaterally with no wheezes/rales/rhonchi.  Normal respiratory effort. . Abdomen:  soft, NT, ND, NABS . Skin:  Petechial rash primarily on LE but diffusely on abdomen, chest, back, arms; also with scattered but fairly diffuse ecchymoses including B hands     . Musculoskeletal:  grossly normal tone BUE/BLE, good ROM, no bony abnormality . Psychiatric:  grossly normal mood and affect, speech fluent  and appropriate, AOx3 . Neurologic:  CN 2-12 grossly intact, moves all extremities in coordinated fashion    Radiological Exams on Admission: No results found.  EKG: not done   Labs on Admission: I have personally reviewed the available labs and imaging studies at the time of the admission.  Pertinent labs:   Glucose 138 WBC 18.4; 23.9 on 12/29 Hgb 11.0; 11.3 on 12/29 Platelets <5; also <5 on 12/29 - up to 239 on 12/23 INR 1.0 +Smudge cells LDH 276 Respiratory panel negative   Assessment/Plan Principal Problem:   Acute ITP (HCC) Active Problems:   CLL (chronic lymphocytic leukemia) (HCC)   Anemia   Essential hypertension   Dyslipidemia   Acute ITP -Patient with prior diagnosis of CLL presenting with recurrent episodes of acute ITP -She was hospitalized 12/17-19 with this issue and treated with Dexamethasone and IVIG x 2 doses with marked improvement -Dr. Irene Limbo recommended on 12/18 that she have platelet transfusions to maintain platelet count of >10k, 25k with bleeding -She had recurrence on 12/29 and was transfused platelets at the [facility] -She returned today with diffuse petechial rash, as well as mild epistaxis and gingival bleeding -Patient was discussed with Dr. Julien Nordmann, who was asked by the ER to consult on the patient  -He recommended Prednisone 50 mg PO x 1 without platelet transfusion and ongoing monitoring -She was previously recommended to have bone marrow biopsy but her platelets will clearly need to be stabilized  prior -Will admit for ongoing monitoring and stabilization -I offered admission at [facility] since this appears to be an oncologic admission but Dr. Earlie Server did not recommend transfer at this time -Will repeat CBC at [place] and again tomorrow at [place] -patient with prognostic FISH panel without detectable mutations, Rai stage 0 -New anemia and thrombocytopenia with markedly increased WBC count - creating concern for progressive disease -She was scheduled to have 1/4 appointment to develop a treatment plan for this issue but her recurrent ITP appears to be pushing the issue -Awaiting oncology consult  HTN -Continue Norvasc, Accupril -Continue home Lasix -Elevated BP on admission, will cover with IV hydralazine prn  HLD -Continue Lipitor  Depression -Continue Zoloft     Note: This patient has been tested and is negative for the novel coronavirus COVID-19.  DVT prophylaxis: None for now given markedly low platelets Code Status: DNR - confirmed with patient Family Communication: None present; I spoke with her daughter by telephone and she requests to be frequently updated on the plan Disposition Plan:  Home once clinically improved Consults called: Oncology Admission status:Admit - It is my clinical opinion that admission to INPATIENT is reasonable and necessary because of the expectation that this patient will require hospital care that crosses at least 2 midnights to treat this condition based on the medical complexity of the problems presented.  Given the aforementioned information, the predictability of an adverse outcome is felt to be significant.    Karmen Bongo MD [facility]   How to contact the [facility] Attending or Consulting provider [place] or covering provider during after hours [place], for this patient?  1. Check the care team in [facility] and look for a) attending/consulting TR[facility] provider listed and b) the [facility] team listed 2. Log into [email or web address] and use [facility]'s universal password to access. If you do not have the password, please contact the hospital operator. 3. Locate the [facility] provider you are looking for under [facility] and page to a  number that you can be directly reached. 4. If you still have difficulty reaching the provider, please page the [facility] (Director on Call) for the Hospitalists listed on amion for assistance.   09/15/2019, 5:34 PM

## 2019-09-15 NOTE — ED Notes (Signed)
Husband would like an update when possible.

## 2019-09-16 ENCOUNTER — Inpatient Hospital Stay (HOSPITAL_COMMUNITY): Payer: PPO

## 2019-09-16 DIAGNOSIS — I629 Nontraumatic intracranial hemorrhage, unspecified: Secondary | ICD-10-CM

## 2019-09-16 DIAGNOSIS — G9340 Encephalopathy, unspecified: Secondary | ICD-10-CM

## 2019-09-16 DIAGNOSIS — I611 Nontraumatic intracerebral hemorrhage in hemisphere, cortical: Secondary | ICD-10-CM

## 2019-09-16 DIAGNOSIS — D693 Immune thrombocytopenic purpura: Secondary | ICD-10-CM

## 2019-09-16 LAB — CBC
HCT: 31.2 % — ABNORMAL LOW (ref 36.0–46.0)
Hemoglobin: 10.4 g/dL — ABNORMAL LOW (ref 12.0–15.0)
MCH: 31 pg (ref 26.0–34.0)
MCHC: 33.3 g/dL (ref 30.0–36.0)
MCV: 92.9 fL (ref 80.0–100.0)
Platelets: <5 10*3/uL — CL (ref 150–400)
RBC: 3.36 MIL/uL — ABNORMAL LOW (ref 3.87–5.11)
RDW: 14.1 % (ref 11.5–15.5)
WBC: 21.9 10*3/uL — ABNORMAL HIGH (ref 4.0–10.5)
nRBC: 0 % (ref 0.0–0.2)

## 2019-09-16 LAB — BASIC METABOLIC PANEL
Anion gap: 8 (ref 5–15)
BUN: 17 mg/dL (ref 8–23)
CO2: 22 mmol/L (ref 22–32)
Calcium: 9 mg/dL (ref 8.9–10.3)
Chloride: 108 mmol/L (ref 98–111)
Creatinine, Ser: 0.64 mg/dL (ref 0.44–1.00)
GFR calc Af Amer: >60 mL/min (ref >60)
GFR calc non Af Amer: >60 mL/min (ref >60)
Glucose, Bld: 126 mg/dL — ABNORMAL HIGH (ref 70–99)
Potassium: 4.2 mmol/L (ref 3.5–5.1)
Sodium: 138 mmol/L (ref 135–145)

## 2019-09-16 LAB — CBC WITH DIFFERENTIAL/PLATELET
Abs Immature Granulocytes: 0.07 10*3/uL (ref 0.00–0.07)
Basophils Absolute: 0.1 10*3/uL (ref 0.0–0.1)
Basophils Relative: 0 %
Eosinophils Absolute: 0.2 10*3/uL (ref 0.0–0.5)
Eosinophils Relative: 1 %
HCT: 28.7 % — ABNORMAL LOW (ref 36.0–46.0)
Hemoglobin: 9.7 g/dL — ABNORMAL LOW (ref 12.0–15.0)
Immature Granulocytes: 0 %
Lymphocytes Relative: 62 %
Lymphs Abs: 15.9 10*3/uL — ABNORMAL HIGH (ref 0.7–4.0)
MCH: 31.3 pg (ref 26.0–34.0)
MCHC: 33.8 g/dL (ref 30.0–36.0)
MCV: 92.6 fL (ref 80.0–100.0)
Monocytes Absolute: 2.9 10*3/uL — ABNORMAL HIGH (ref 0.1–1.0)
Monocytes Relative: 11 %
Neutro Abs: 6.8 10*3/uL (ref 1.7–7.7)
Neutrophils Relative %: 26 %
Platelets: 104 10*3/uL — ABNORMAL LOW (ref 150–400)
RBC: 3.1 MIL/uL — ABNORMAL LOW (ref 3.87–5.11)
RDW: 14.3 % (ref 11.5–15.5)
WBC: 26 10*3/uL — ABNORMAL HIGH (ref 4.0–10.5)
nRBC: 0 % (ref 0.0–0.2)

## 2019-09-16 LAB — PROTIME-INR
INR: 1 (ref 0.8–1.2)
Prothrombin Time: 13 seconds (ref 11.4–15.2)

## 2019-09-16 LAB — APTT: aPTT: 23 seconds — ABNORMAL LOW (ref 24–36)

## 2019-09-16 LAB — GLUCOSE, CAPILLARY: Glucose-Capillary: 110 mg/dL — ABNORMAL HIGH (ref 70–99)

## 2019-09-16 LAB — MRSA PCR SCREENING: MRSA by PCR: NEGATIVE

## 2019-09-16 MED ORDER — IMMUNE GLOBULIN (HUMAN) 10 GM/100ML IV SOLN
90.0000 g | INTRAVENOUS | Status: AC
  Administered 2019-09-16 – 2019-09-17 (×2): 90 g via INTRAVENOUS
  Filled 2019-09-16: qty 100
  Filled 2019-09-16: qty 800

## 2019-09-16 MED ORDER — DIPHENHYDRAMINE HCL 50 MG/ML IJ SOLN
25.0000 mg | Freq: Once | INTRAMUSCULAR | Status: AC
  Administered 2019-09-16: 25 mg via INTRAVENOUS
  Filled 2019-09-16: qty 1

## 2019-09-16 MED ORDER — PREDNISONE 20 MG PO TABS
50.0000 mg | ORAL_TABLET | Freq: Every day | ORAL | Status: DC
  Administered 2019-09-17 – 2019-09-20 (×4): 50 mg via ORAL
  Filled 2019-09-16: qty 3
  Filled 2019-09-16: qty 2
  Filled 2019-09-16: qty 3
  Filled 2019-09-16: qty 2

## 2019-09-16 MED ORDER — ACETAMINOPHEN 325 MG PO TABS
650.0000 mg | ORAL_TABLET | Freq: Once | ORAL | Status: AC
  Administered 2019-09-16: 650 mg via ORAL
  Filled 2019-09-16: qty 2

## 2019-09-16 MED ORDER — CHLORHEXIDINE GLUCONATE CLOTH 2 % EX PADS
6.0000 | MEDICATED_PAD | Freq: Every day | CUTANEOUS | Status: DC
  Administered 2019-09-16 – 2019-09-17 (×2): 6 via TOPICAL

## 2019-09-16 MED ORDER — SODIUM CHLORIDE 0.9% IV SOLUTION: Freq: Once | INTRAVENOUS | Status: AC

## 2019-09-16 MED ORDER — CLEVIDIPINE BUTYRATE 0.5 MG/ML IV EMUL
0.0000 mg/h | INTRAVENOUS | Status: DC
  Administered 2019-09-16: 16 mg/h via INTRAVENOUS
  Administered 2019-09-16: 18 mg/h via INTRAVENOUS
  Administered 2019-09-16: 16 mg/h via INTRAVENOUS
  Administered 2019-09-16: 2 mg/h via INTRAVENOUS
  Administered 2019-09-17 (×3): 21 mg/h via INTRAVENOUS
  Filled 2019-09-16 (×3): qty 100
  Filled 2019-09-16 (×4): qty 50
  Filled 2019-09-16: qty 100
  Filled 2019-09-16 (×2): qty 50

## 2019-09-16 NOTE — Code Documentation (Addendum)
I received a call from nurse with concerns of patient having acute onset of word finding difficulty and slurred speech. LSN 950. Upon arrival, patient was aphasic and had some mild dysarthria, nursing staff informed me that the patient's platelet count was less < 5 and currently her SBP in was in the 180s. I immediately activated a Code Stroke and proceeded to CT for STAT HEAD CT. CT + 2 cm left temporal lobe parenchymal hemorrhage with mild surrounding edema. Trace surrounding subarachnoid hemorrhage. Additional 3 mm hyperdense focus within the right parietal cortex.This may reflect an additional tiny parenchymal hemorrhage or other hyperdense lesion. Cleviprex started in CT 2 for SBP in the 160s and patient was taken back to 6N03. PCCM was consulted along with Hem/Onc. VAST RN was paged, another PIV was established.   NIH remained 7. Patient pending transfer to NTICU once bed is available.   At 1125 - did endorse some nausea, I administered Zofran 4 mg IV x 1 and nausea subsided. No acute neuro change and denies HA.   Hem/Onc MD came to the bedside -- Plan IVIG and Platelet transfusions  Transferred 4N21 at 1315

## 2019-09-16 NOTE — Progress Notes (Signed)
On rounds at 1000 patient was found with slurred speech. LKW was 0950. Rapid response nurse and Dr. Wyonia Hough were notified. BP 184/75. CBG 110

## 2019-09-16 NOTE — Progress Notes (Signed)
Patient transferred to 4N21. Left a message on daughter ,[name] , voice message letting her know of the transfer and the phone number to 4N

## 2019-09-16 NOTE — Consult Note (Addendum)
NEURO HOSPITALIST  CONSULT   Requesting Physician: Dr. Wyonia Hough    Chief Complaint: Code stroke AMS/ slurred speech  History obtained from:  Chart review HPI:                                                                                                                                         Selena Branch is an 79 y.o. 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 Code stroke called 1/3.  LKW was 0950 on 1/3. Patient had sudden onset of slurred speech. Rapid response nurse initiated a code stroke on the floor. Interval history: patient diagnosed with CLL in march. Was being monitored q3 months at the cancer center. 12/17 was admitted for thrombocytopenia d/ CLL. On 12/29 she noticed petechiae. She was diagnosed with acute ITP and given platelets and transfused 1 pack of platelets.  She was given platelets Tuesday gums and nose began bleeding Thursday and Friday and then she developed diffuse petechiae. Re-admitted 09/15/19. 09/15/2018: code stroke called for acute change; slurred speech.  She was admitted to [facility] for treatment of above when she suddenly became affected with slurred speech.   CTH  2 CM left temporal lobe parenchymal hemorrhage with mild surrounding edema; trace surrounding subarachnoid hemorrhage.  Additional 3 mm hyperdense focus within right parietal cortex BP: 184/75   tPA Given:no; hemorrhage Last known well 9:50 AM today  Intracerebral Hemorrhage (ICH) Score  Total:  0  Past Medical History:  Diagnosis Date  . Anxiety   . 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 Glaucoma   . Hyperlipemia   . Hypertension   . Osteoporosis     History reviewed. No pertinent surgical history.  Family History  Problem Relation Age of Onset  . Stroke Mother   . Lung cancer Father   . Leukemia Neg Hx         Social History:  reports that she has never smoked. She has never used  smokeless tobacco. She reports that she does not drink alcohol or use drugs.  Allergies:  Allergies  Allergen Reactions  . Codeine Nausea Only    Medications:  Scheduled: . amLODipine  5 mg Oral QHS  . atorvastatin  40 mg Oral Daily  . docusate sodium  100 mg Oral BID  . dorzolamide-timolol  1 drop Both Eyes Daily  . furosemide  20 mg Oral Daily  . latanoprost  1 drop Both Eyes QHS  . quinapril  40 mg Oral Daily  . sertraline  25 mg Oral Daily   Continuous:  [id number] **OR** acetaminophen, hydrALAZINE, ondansetron **OR** ondansetron (ZOFRAN) IV   ROS:                                                                                                                                       Unable to obtain due to aphasia   General Examination:                                                                                                      Blood pressure (!) 184/75, pulse 88, temperature 98.2 F (36.8 C), temperature source Oral, resp. rate 17, height 4\' 10"  (1.473 m), weight 47.7 kg, SpO2 90 %.  Physical Exam  Constitutional: Appears well-developed and well-nourished.  Psych: Affect appropriate to situation Eyes: Normal external eye and conjunctiva. HENT: Normocephalic, dried blood in mouth Musculoskeletal-no joint tenderness, deformity or swelling;  Cardiovascular: Normal rate and regular rhythm.  Respiratory: Effort normal, non-labored breathing saturations WNL GI: Soft.  No distension. There is no tenderness.  Skin: WDI, diffuse petechiae.  Neurological Examination Mental Status: Alert,  Speech and language Is able to say some words and short sentences clearly, not able to follow commands. Able to mimic some commands. Cranial Nerves:  blinks to threat bilaterally  ptosis not present, extra-ocular motions intact bilaterally, pupils equal,  round, reactive to light and accommodation  face appears symmetric,  hard of hearing in right ear  Motor/sensory: Able to raise all 4 extremities anti- gravity Tone and bulk:normal tone throughout; no atrophy noted Cerebellar: No ataxia noted Gait: deferred NIHSS-4   Lab Results: Basic Metabolic Panel: Recent Labs  Lab 09/11/19 1335 09/15/19 0904 09/16/19 0133  NA 141 140 138  K 4.3 3.8 4.2  CL 109 107 108  CO2 25 24 22   GLUCOSE 106* 138* 126*  BUN 24* 18 17  CREATININE 0.63 0.81 0.64  CALCIUM 9.0 9.1 9.0    CBC: Recent Labs  Lab 09/11/19 1335 09/15/19 0904 09/15/19 1942 09/16/19 0133  WBC 23.9* 18.4* 20.2* 21.9*  NEUTROABS 5.3 4.2  --   --   HGB 11.3*  11.0* 11.0* 10.4*  HCT 35.5* 34.4* 33.2* 31.2*  MCV 96.5 96.9 92.7 92.9  PLT <5* <5* <5* <5*    CBG: Recent Labs  Lab 09/16/19 1009  GLUCAP 110*    Imaging: CT HEAD CODE STROKE WO CONTRAST  Result Date: 09/16/2019 CLINICAL DATA:  Code stroke. Neuro deficit, acute, stroke suspected. Additional history provided: Last known normal 9:50 AM, slurred speech and confusion EXAM: CT HEAD WITHOUT CONTRAST TECHNIQUE: Contiguous axial images were obtained from the base of the skull through the vertex without intravenous contrast. COMPARISON:  CT head 08/09/2018, brain MRI 01/14/2016 FINDINGS: Brain: There is an acute parenchymal hemorrhage centered within the left temporal lobe with mild surrounding edema. The hemorrhage measures 1.6 x 1.6 x 2.0 cm. Trace surrounding subarachnoid hemorrhage. Minimal regional mass effect. No effacement of the ventricular system or midline shift. Additional 3 mm hyperdense focus within the cortex of the right parietal lobe (series 6, image 11) (series 3, image 17). No extra-axial fluid collection. Mild ill-defined hypoattenuation within the cerebral white matter is nonspecific, but consistent with chronic small vessel ischemic disease. Mild generalized parenchymal atrophy. Vascular: No hyperdense  vessel.  Atherosclerotic calcifications. Skull: Normal. Negative for fracture or focal lesion. Sinuses/Orbits: Visualized orbits demonstrate no acute abnormality. Other: No significant paranasal sinus disease or mastoid effusion at the imaged levels. These results were called by telephone at the time of interpretation on 09/16/2019 at 10:32 am to provider Dr. Rory Percy, who verbally acknowledged these results. IMPRESSION: 2 cm left temporal lobe parenchymal hemorrhage with mild surrounding edema. Trace surrounding subarachnoid hemorrhage. Contrast-enhanced brain MRI is recommended once the hematoma involutes to assess for an underlying lesion. Additional 3 mm hyperdense focus within the right parietal cortex. This may reflect an additional tiny parenchymal hemorrhage or other hyperdense lesion. This too should have MRI follow-up. Generalized parenchymal atrophy and chronic small vessel ischemic disease. Electronically Signed   By: Kellie Simmering DO   On: 09/16/2019 10:34    Laurey Morale, MSN, NP-C [facility] [phone]  09/16/2019, 10:20 AM   Attending physician note to follow with Assessment and plan .  Attending addendum Patient seen and examined as acute code stroke Patient with an ITP, CLL, being treated for thrombocytopenia with platelet count less than 5000. Received transfusion of platelets with platelet count still remaining under 5000. Sudden onset of speech difficulty this morning. NIH stroke scale-4  Assessment: 79 y.o. female admitted for ITP. Had sudden onset of slurred speech.  Had significant receptive aphasia on exam.  Code stroke initiated. CTH revealed a 2cm parenchymal hemorrhage in the left hemisphere and possibly punctate area in the right hemisphere as well..  Patient hypertensive and thrombocytopenic. Cleviprex drip initiated.  Consulted CCM for admission.  Also consulted hematology, Dr. Earlie Server, who recommends IVIG. Stroke Risk Factors -malignancy,  thrombocytopenia, hypertension Etiology : cerebral hemorrhage d/t ITP   Recommendations: -- BP goal : SBP< 140 -- start cleviprex drip --Telemetry monitoring --Frequent neuro checks --No antiplatelets or anticoagulants --Management of thrombocytopenia per hematology. --Repeat head CT in 6 hours --Updated daughter over the phone. --Discussed plan with Dr. Julien Nordmann and Dr. Chase Caller  -- Amie Portland, MD [facility] Pager: [phone] If 7pm to 7am, please call on call as listed on AMION.  CRITICAL CARE ATTESTATION Performed by: Amie Portland, MD Total critical care time: 55 minutes Critical care time was exclusive of separately billable procedures and treating other patients and/or supervising APPs/Residents/Students Critical care was necessary to treat or prevent imminent or life-threatening deterioration due to  acute intracerebral hemorrhage, thrombocytopenia, hypertensive emergency This patient is critically ill and at significant risk for neurological worsening and/or death and care requires constant monitoring. Critical care was time spent personally by me on the following activities: development of treatment plan with patient and/or surrogate as well as nursing, discussions with consultants, evaluation of patient's response to treatment, examination of patient, obtaining history from patient or surrogate, ordering and performing treatments and interventions, ordering and review of laboratory studies, ordering and review of radiographic studies, pulse oximetry, re-evaluation of patient's condition, participation in multidisciplinary rounds and medical decision making of high complexity in the care of this patient.

## 2019-09-16 NOTE — Progress Notes (Signed)
PROGRESS NOTE    Selena Branch  [id number] DOB: 1941/01/04 DOA: 09/15/2019 PCP: [name], MD   Brief Narrative:  Per admitting physician: Selena Branch is a 79 y.o. female with medical history significant of HTN; HLD; CLL; and glaucoma presenting with bleeding of gums and nose, petechiae.  She was diagnosed with CLL in March and was going into to the [facility] for ongoing monitoring every 3 months.  She was due to go Monday to develop a treatment plan and things went crazy on Monday.  Patient was last admitted from 12/17-19 with thrombocytopenia associated with CLL; oncology was consulted and she was treated with Dexamethasone and IVIG.  Platelets were 63 at the time of d/c and she was recommended to have a BM biopsy the following week.  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  Clearly, her blood did not like the platelets and she had to come back.  She was given platelets Tuesday noticed bleeding from the gums and nose Thursday-Friday and then developed diffuse petechiae.  Things were worse this AM and she "realized I really had an issue."  Her prior hospitalization kept her without symptoms through 12/29.  No falls.  No headaches.   ED Course:  H/o CLL without treatment yet.  ITP earlier this week, recurrent bleeding none active).  Platelets <5.  Dr. Earlie Server suggests 1 mg/kg prednisone and follow   Assessment & Plan:   Principal Problem:   Acute ITP ([place]) Active Problems:   CLL (chronic lymphocytic leukemia) ([place])   Anemia   Essential hypertension   Dyslipidemia   Intracranial bleed (HCC)   ITP with intracranial head bleed.: Patient had acute onset mental status changes this morning Stat head CT revealed intracranial hemorrhage Case discussed with neurology and critical care medicine Patient transferred to ICU Heme-onc also involved with  planned Platelets, IVIG,    DVT prophylaxis: None:Platelets less than 5 high risk of bleeding Code Status: DNR    Code Status Orders  (From admission, onward)         Start     Ordered   09/15/19 1316  Do not attempt resuscitation (DNR)  Continuous    Question Answer Comment  In the event of cardiac or respiratory ARREST Do not call a "code blue"   In the event of cardiac or respiratory ARREST Do not perform Intubation, CPR, defibrillation or ACLS   In the event of cardiac or respiratory ARREST Use medication by any route, position, wound care, and other measures to relive pain and suffering. May use oxygen, suction and manual treatment of airway obstruction as needed for comfort.      09/15/19 1316        Code Status History    Date Active Date Inactive Code Status Order ID Comments User Context   08/30/2019 1825 09/01/2019 1956 Full Code [id number]  Lequita Halt, MD ED   Advance Care Planning Activity    Advance Directive Documentation     Most Recent Value  Type of Advance Directive  Healthcare Power of Attorney  Pre-existing out of facility DNR order (yellow form or pink MOST form)  --  "MOST" Form in Place?  --     Family Communication: Discussed with both husband and daughter Disposition Plan: Patient transferred to ICU under critical care management Consults called: Critical care medicine, neurology, hematology  oncology Admission status: Inpatient   Consultants:   As above  Procedures:  CT CHEST W CONTRAST  Result Date: 08/31/2019 CLINICAL DATA:  Inpatient. CLL, presenting for staging. New anemia and severe thrombocytopenia. EXAM: CT CHEST, ABDOMEN, AND PELVIS WITH CONTRAST TECHNIQUE: Multidetector CT imaging of the chest, abdomen and pelvis was performed following the standard protocol during bolus administration of intravenous contrast. CONTRAST:  198mL OMNIPAQUE IOHEXOL 300 MG/ML  SOLN COMPARISON:  05/07/2010 chest CT angiogram. FINDINGS: CT CHEST FINDINGS  Cardiovascular: Normal heart size. No significant pericardial effusion/thickening. Left anterior descending coronary atherosclerosis. Atherosclerotic nonaneurysmal thoracic aorta. Normal caliber pulmonary arteries. No central pulmonary emboli. Mediastinum/Nodes: No discrete thyroid nodules. Unremarkable esophagus. No pathologically enlarged axillary, mediastinal or hilar lymph nodes. Lungs/Pleura: No pneumothorax. No pleural effusion. No acute consolidative airspace disease or lung masses. A few scattered perifissural and subpleural solid pulmonary nodules in both lungs measuring up to 5 mm in the anterior right lower lobe (series 5/image 75), all stable since 05/07/2010 chest CT, considered benign. No new significant pulmonary nodules. Mild platelike scarring versus atelectasis at the dependent lung bases bilaterally. Musculoskeletal: No aggressive appearing focal osseous lesions. Mild thoracic spondylosis. Chronic mild posterior T10 vertebral compression deformity. CT ABDOMEN PELVIS FINDINGS Hepatobiliary: Normal liver with no liver mass. Normal gallbladder with no radiopaque cholelithiasis. No biliary ductal dilatation. Small periampullary duodenal diverticulum. Pancreas: Normal, with no mass or duct dilation. Spleen: Normal size. No mass. Adrenals/Urinary Tract: Normal adrenals. Normal kidneys with no hydronephrosis and no renal mass. Normal bladder. Stomach/Bowel: Small hiatal hernia. Otherwise normal nondistended stomach. Normal caliber small bowel with no small bowel wall thickening. Normal appendix. Oral contrast transits to the left colon. Moderate sigmoid diverticulosis, with no large bowel wall thickening or significant pericolonic fat stranding. Vascular/Lymphatic: Atherosclerotic nonaneurysmal abdominal aorta. Patent portal, splenic, hepatic and renal veins. Mildly enlarged 1.0 cm right inguinal node (series 3/image 105). Mild bilateral external iliac adenopathy up to 1.1 cm on the right (series 3/image  97) and 1.0 cm on the left (series 3/image 99). Mildly enlarged 1.2 cm left common iliac node (series 3/image 82). Left para-aortic adenopathy up to 1.2 cm (series 3/image 70). Aortocaval adenopathy up to 1.1 cm (series 3/image 73). Reproductive: Normal uterus. No right adnexal mass. Simple 5.3 cm left adnexal cyst (series 3/image 94). Other: No pneumoperitoneum, ascites or focal fluid collection. Musculoskeletal: No aggressive appearing focal osseous lesions. Moderate lower lumbar degenerative changes. IMPRESSION: 1. Mild multistation lymphadenopathy in the retroperitoneum and bilateral pelvis as detailed, compatible with lymphoma. Normal size spleen. No thoracic adenopathy. 2. One vessel coronary atherosclerosis. 3. Moderate sigmoid diverticulosis. 4. Simple 5.3 cm left adnexal cyst. Pelvic ultrasound evaluation recommended. This recommendation follows ACR consensus guidelines: White Paper of the ACR Incidental Findings Committee II on Adnexal Findings. J Am Coll Radiol 2013:10:675-681. 5.  Aortic Atherosclerosis (ICD10-I70.0). Electronically Signed   By: Ilona Sorrel M.D.   On: 08/31/2019 12:14   CT ABDOMEN PELVIS W CONTRAST  Result Date: 08/31/2019 CLINICAL DATA:  Inpatient. CLL, presenting for staging. New anemia and severe thrombocytopenia. EXAM: CT CHEST, ABDOMEN, AND PELVIS WITH CONTRAST TECHNIQUE: Multidetector CT imaging of the chest, abdomen and pelvis was performed following the standard protocol during bolus administration of intravenous contrast. CONTRAST:  153mL OMNIPAQUE IOHEXOL 300 MG/ML  SOLN COMPARISON:  05/07/2010 chest CT angiogram. FINDINGS: CT CHEST FINDINGS Cardiovascular: Normal heart size. No significant pericardial effusion/thickening. Left anterior descending coronary atherosclerosis. Atherosclerotic nonaneurysmal thoracic aorta. Normal caliber pulmonary arteries. No central pulmonary emboli. Mediastinum/Nodes: No discrete thyroid  nodules. Unremarkable esophagus. No pathologically  enlarged axillary, mediastinal or hilar lymph nodes. Lungs/Pleura: No pneumothorax. No pleural effusion. No acute consolidative airspace disease or lung masses. A few scattered perifissural and subpleural solid pulmonary nodules in both lungs measuring up to 5 mm in the anterior right lower lobe (series 5/image 75), all stable since 05/07/2010 chest CT, considered benign. No new significant pulmonary nodules. Mild platelike scarring versus atelectasis at the dependent lung bases bilaterally. Musculoskeletal: No aggressive appearing focal osseous lesions. Mild thoracic spondylosis. Chronic mild posterior T10 vertebral compression deformity. CT ABDOMEN PELVIS FINDINGS Hepatobiliary: Normal liver with no liver mass. Normal gallbladder with no radiopaque cholelithiasis. No biliary ductal dilatation. Small periampullary duodenal diverticulum. Pancreas: Normal, with no mass or duct dilation. Spleen: Normal size. No mass. Adrenals/Urinary Tract: Normal adrenals. Normal kidneys with no hydronephrosis and no renal mass. Normal bladder. Stomach/Bowel: Small hiatal hernia. Otherwise normal nondistended stomach. Normal caliber small bowel with no small bowel wall thickening. Normal appendix. Oral contrast transits to the left colon. Moderate sigmoid diverticulosis, with no large bowel wall thickening or significant pericolonic fat stranding. Vascular/Lymphatic: Atherosclerotic nonaneurysmal abdominal aorta. Patent portal, splenic, hepatic and renal veins. Mildly enlarged 1.0 cm right inguinal node (series 3/image 105). Mild bilateral external iliac adenopathy up to 1.1 cm on the right (series 3/image 97) and 1.0 cm on the left (series 3/image 99). Mildly enlarged 1.2 cm left common iliac node (series 3/image 82). Left para-aortic adenopathy up to 1.2 cm (series 3/image 70). Aortocaval adenopathy up to 1.1 cm (series 3/image 73). Reproductive: Normal uterus. No right adnexal mass. Simple 5.3 cm left adnexal cyst (series 3/image  94). Other: No pneumoperitoneum, ascites or focal fluid collection. Musculoskeletal: No aggressive appearing focal osseous lesions. Moderate lower lumbar degenerative changes. IMPRESSION: 1. Mild multistation lymphadenopathy in the retroperitoneum and bilateral pelvis as detailed, compatible with lymphoma. Normal size spleen. No thoracic adenopathy. 2. One vessel coronary atherosclerosis. 3. Moderate sigmoid diverticulosis. 4. Simple 5.3 cm left adnexal cyst. Pelvic ultrasound evaluation recommended. This recommendation follows ACR consensus guidelines: White Paper of the ACR Incidental Findings Committee II on Adnexal Findings. J Am Coll Radiol 2013:10:675-681. 5.  Aortic Atherosclerosis (ICD10-I70.0). Electronically Signed   By: Ilona Sorrel M.D.   On: 08/31/2019 12:14   CT HEAD CODE STROKE WO CONTRAST  Result Date: 09/16/2019 CLINICAL DATA:  Code stroke. Neuro deficit, acute, stroke suspected. Additional history provided: Last known normal 9:50 AM, slurred speech and confusion EXAM: CT HEAD WITHOUT CONTRAST TECHNIQUE: Contiguous axial images were obtained from the base of the skull through the vertex without intravenous contrast. COMPARISON:  CT head 08/09/2018, brain MRI 01/14/2016 FINDINGS: Brain: There is an acute parenchymal hemorrhage centered within the left temporal lobe with mild surrounding edema. The hemorrhage measures 1.6 x 1.6 x 2.0 cm. Trace surrounding subarachnoid hemorrhage. Minimal regional mass effect. No effacement of the ventricular system or midline shift. Additional 3 mm hyperdense focus within the cortex of the right parietal lobe (series 6, image 11) (series 3, image 17). No extra-axial fluid collection. Mild ill-defined hypoattenuation within the cerebral white matter is nonspecific, but consistent with chronic small vessel ischemic disease. Mild generalized parenchymal atrophy. Vascular: No hyperdense vessel.  Atherosclerotic calcifications. Skull: Normal. Negative for fracture or  focal lesion. Sinuses/Orbits: Visualized orbits demonstrate no acute abnormality. Other: No significant paranasal sinus disease or mastoid effusion at the imaged levels. These results were called by telephone at the time of interpretation on 09/16/2019 at 10:32 am to provider Dr. Rory Percy, who verbally acknowledged  these results. IMPRESSION: 2 cm left temporal lobe parenchymal hemorrhage with mild surrounding edema. Trace surrounding subarachnoid hemorrhage. Contrast-enhanced brain MRI is recommended once the hematoma involutes to assess for an underlying lesion. Additional 3 mm hyperdense focus within the right parietal cortex. This may reflect an additional tiny parenchymal hemorrhage or other hyperdense lesion. This too should have MRI follow-up. Generalized parenchymal atrophy and chronic small vessel ischemic disease. Electronically Signed   By: Kellie Simmering DO   On: 09/16/2019 10:34   VAS [place] LOWER EXTREMITY VENOUS (DVT)  Result Date: 09/01/2019  Lower Venous Study Indications: Edema.  Comparison Study: no prior Performing Technologist: June Leap RDMS, RVT  Examination Guidelines: A complete evaluation includes B-mode imaging, spectral Doppler, color Doppler, and power Doppler as needed of all accessible portions of each vessel. Bilateral testing is considered an integral part of a complete examination. Limited examinations for reoccurring indications may be performed as noted.  +-----+---------------+---------+-----------+----------+--------------+ RIGHTCompressibilityPhasicitySpontaneityPropertiesThrombus Aging +-----+---------------+---------+-----------+----------+--------------+ CFV  Full           Yes      Yes                                 +-----+---------------+---------+-----------+----------+--------------+   +---------+---------------+---------+-----------+----------+--------------+ LEFT     CompressibilityPhasicitySpontaneityPropertiesThrombus Aging  +---------+---------------+---------+-----------+----------+--------------+ CFV      Full           Yes      Yes                                 +---------+---------------+---------+-----------+----------+--------------+ SFJ      Full                                                        +---------+---------------+---------+-----------+----------+--------------+ FV Prox  Full                                                        +---------+---------------+---------+-----------+----------+--------------+ FV Mid   Full                                                        +---------+---------------+---------+-----------+----------+--------------+ FV DistalFull                                                        +---------+---------------+---------+-----------+----------+--------------+ PFV      Full                                                        +---------+---------------+---------+-----------+----------+--------------+ POP      Full  Yes      Yes                                 +---------+---------------+---------+-----------+----------+--------------+ PTV      Full                                                        +---------+---------------+---------+-----------+----------+--------------+ PERO     Full                                                        +---------+---------------+---------+-----------+----------+--------------+     Summary: Right: No evidence of common femoral vein obstruction. Left: There is no evidence of deep vein thrombosis in the lower extremity. No cystic structure found in the popliteal fossa.  *See table(s) above for measurements and observations. Electronically signed by Harold Barban MD on 09/01/2019 at 4:38:59 PM.    Final      Antimicrobials:   None   Subjective: Patient with acute events this morning CT identified head bleed as noted above Patient transferred to ICU for critical  care treatment Neurology hematology also involved  Objective: Vitals:   09/16/19 1231 09/16/19 1237 09/16/19 1241 09/16/19 1247  BP: (!) 135/49 (!) 124/51 (!) 133/49 (!) 127/55  Pulse: 97 (!) 103 (!) 104 (!) 105  Resp:      Temp:      TempSrc:      SpO2: 95% 97% 95% 97%  Weight:      Height:        Intake/Output Summary (Last 24 hours) at 09/16/2019 1303 Last data filed at 09/16/2019 1200 Gross per 24 hour  Intake 340.87 ml  Output --  Net 340.87 ml   Filed Weights   09/15/19 1609  Weight: 47.7 kg    Examination:  General exam: Appears calm and comfortable, initial exam with subsequent exam showing confusion slurred speech and some word finding difficulty Respiratory system: Clear to auscultation. Respiratory effort normal. Cardiovascular system: S1 & S2 heard, RRR. No JVD, murmurs, rubs, gallops or clicks. No pedal edema. Gastrointestinal system: Abdomen is nondistended, soft and nontender. No organomegaly or masses felt. Normal bowel sounds heard. Central nervous system: Alert, confused, slurred speech, Extremities: Symmetric 5 x 5 power. Skin: Petechia consistent with thrombocytopenia Psychiatry: Judgement and insight are impaired.      Data Reviewed: I have personally reviewed following labs and imaging studies  CBC: Recent Labs  Lab 09/11/19 1335 09/15/19 0904 09/15/19 1942 09/16/19 0133 09/16/19 1141  WBC 23.9* 18.4* 20.2* 21.9* 24.8*  NEUTROABS 5.3 4.2  --   --  PENDING  HGB 11.3* 11.0* 11.0* 10.4* 10.2*  HCT 35.5* 34.4* 33.2* 31.2* 30.5*  MCV 96.5 96.9 92.7 92.9 93.0  PLT <5* <5* <5* <5* 2*   Basic Metabolic Panel: Recent Labs  Lab 09/11/19 1335 09/15/19 0904 09/16/19 0133  NA 141 140 138  K 4.3 3.8 4.2  CL 109 107 108  CO2 25 24 22   GLUCOSE 106* 138* 126*  BUN 24* 18 17  CREATININE 0.63 0.81 0.64  CALCIUM 9.0 9.1 9.0   GFR: Estimated  Creatinine Clearance: 36.8 mL/min (by C-G formula based on SCr of 0.64 mg/dL). Liver Function  Tests: Recent Labs  Lab 09/11/19 1335 09/15/19 0904  AST 29 35  ALT 25 22  ALKPHOS 64 53  BILITOT 0.7 0.8  PROT 7.3 6.7  ALBUMIN 3.5 3.5   No results for input(s): LIPASE, AMYLASE in the last 168 hours. No results for input(s): AMMONIA in the last 168 hours. Coagulation Profile: Recent Labs  Lab 09/15/19 1008 09/16/19 1141  INR 1.0 1.0   Cardiac Enzymes: No results for input(s): CKTOTAL, CKMB, CKMBINDEX, TROPONINI in the last 168 hours. BNP (last 3 results) No results for input(s): PROBNP in the last 8760 hours. HbA1C: No results for input(s): HGBA1C in the last 72 hours. CBG: Recent Labs  Lab 09/16/19 1009  GLUCAP 110*   Lipid Profile: No results for input(s): CHOL, HDL, LDLCALC, TRIG, CHOLHDL, LDLDIRECT in the last 72 hours. Thyroid Function Tests: No results for input(s): TSH, T4TOTAL, FREET4, T3FREE, THYROIDAB in the last 72 hours. Anemia Panel: No results for input(s): VITAMINB12, FOLATE, FERRITIN, TIBC, IRON, RETICCTPCT in the last 72 hours. Sepsis Labs: No results for input(s): PROCALCITON, LATICACIDVEN in the last 168 hours.  Recent Results (from the past 240 hour(s))  Respiratory Panel by RT PCR (Flu A&B, Covid) - Nasopharyngeal Swab     Status: None   Collection Time: 09/15/19  3:40 PM   Specimen: Nasopharyngeal Swab  Result Value Ref Range Status   SARS Coronavirus 2 by RT PCR NEGATIVE NEGATIVE Final    Comment: (NOTE) SARS-CoV-2 target nucleic acids are NOT DETECTED. The SARS-CoV-2 RNA is generally detectable in upper respiratoy specimens during the acute phase of infection. The lowest concentration of SARS-CoV-2 viral copies this assay can detect is 131 copies/mL. A negative result does not preclude SARS-Cov-2 infection and should not be used as the sole basis for treatment or other patient management decisions. A negative result may occur with  improper specimen collection/handling, submission of specimen other than nasopharyngeal swab, presence  of viral mutation(s) within the areas targeted by this assay, and inadequate number of viral copies (<131 copies/mL). A negative result must be combined with clinical observations, patient history, and epidemiological information. The expected result is Negative. Fact Sheet for Patients:  PinkCheek.be Fact Sheet for Healthcare Providers:  GravelBags.it This test is not yet ap proved or cleared by the [place] FDA and  has been authorized for detection and/or diagnosis of SARS-CoV-2 by FDA under an Emergency Use Authorization (EUA). This EUA will remain  in effect (meaning this test can be used) for the duration of the COVID-19 declaration under Section 564(b)(1) of the Act, 21 U.S.C. section 360bbb-3(b)(1), unless the authorization is terminated or revoked sooner.    Influenza A by PCR NEGATIVE NEGATIVE Final   Influenza B by PCR NEGATIVE NEGATIVE Final    Comment: (NOTE) The Xpert Xpress SARS-CoV-2/FLU/RSV assay is intended as an aid in  the diagnosis of influenza from Nasopharyngeal swab specimens and  should not be used as a sole basis for treatment. Nasal washings and  aspirates are unacceptable for Xpert Xpress SARS-CoV-2/FLU/RSV  testing. Fact Sheet for Patients: PinkCheek.be Fact Sheet for Healthcare Providers: GravelBags.it This test is not yet approved or cleared by the [place] FDA and  has been authorized for detection and/or diagnosis of SARS-CoV-2 by  FDA under an Emergency Use Authorization (EUA). This EUA will remain  in effect (meaning this test can be used) for the duration of the  Covid-19 declaration  under Section 564(b)(1) of the Act, 21  U.S.C. section 360bbb-3(b)(1), unless the authorization is  terminated or revoked. Performed at [facility], [place] [address] [place], [place] [address]          Radiology Studies: CT HEAD  CODE STROKE WO CONTRAST  Result Date: 09/16/2019 CLINICAL DATA:  Code stroke. Neuro deficit, acute, stroke suspected. Additional history provided: Last known normal 9:50 AM, slurred speech and confusion EXAM: CT HEAD WITHOUT CONTRAST TECHNIQUE: Contiguous axial images were obtained from the base of the skull through the vertex without intravenous contrast. COMPARISON:  CT head 08/09/2018, brain MRI 01/14/2016 FINDINGS: Brain: There is an acute parenchymal hemorrhage centered within the left temporal lobe with mild surrounding edema. The hemorrhage measures 1.6 x 1.6 x 2.0 cm. Trace surrounding subarachnoid hemorrhage. Minimal regional mass effect. No effacement of the ventricular system or midline shift. Additional 3 mm hyperdense focus within the cortex of the right parietal lobe (series 6, image 11) (series 3, image 17). No extra-axial fluid collection. Mild ill-defined hypoattenuation within the cerebral white matter is nonspecific, but consistent with chronic small vessel ischemic disease. Mild generalized parenchymal atrophy. Vascular: No hyperdense vessel.  Atherosclerotic calcifications. Skull: Normal. Negative for fracture or focal lesion. Sinuses/Orbits: Visualized orbits demonstrate no acute abnormality. Other: No significant paranasal sinus disease or mastoid effusion at the imaged levels. These results were called by telephone at the time of interpretation on 09/16/2019 at 10:32 am to provider Dr. Rory Percy, who verbally acknowledged these results. IMPRESSION: 2 cm left temporal lobe parenchymal hemorrhage with mild surrounding edema. Trace surrounding subarachnoid hemorrhage. Contrast-enhanced brain MRI is recommended once the hematoma involutes to assess for an underlying lesion. Additional 3 mm hyperdense focus within the right parietal cortex. This may reflect an additional tiny parenchymal hemorrhage or other hyperdense lesion. This too should have MRI follow-up. Generalized parenchymal atrophy and  chronic small vessel ischemic disease. Electronically Signed   By: Kellie Simmering DO   On: 09/16/2019 10:34        Scheduled Meds: . sodium chloride   Intravenous Once  . acetaminophen  650 mg Oral Once  . diphenhydrAMINE  25 mg Intravenous Once  . dorzolamide-timolol  1 drop Both Eyes Daily  . latanoprost  1 drop Both Eyes QHS   Continuous Infusions: . clevidipine 16 mg/hr (09/16/19 1230)  . Immune Globulin 10%       LOS: 1 day    Time spent: 46 MIN    Nicolette Bang, MD [facility]  If 7PM-7AM, please contact night-coverage  09/16/2019, 1:03 PM

## 2019-09-16 NOTE — Consult Note (Signed)
PULMONARY / CRITICAL CARE MEDICINE   NAME:  Selena Branch, MRN:  [id number], DOB:  [birth year], LOS: 1 ADMISSION DATE:  09/15/2019, CONSULTATION DATE:  09/16/2019 REFERRING MD: Karmen Bongo, CHIEF COMPLAINT:  Acute ITP  BRIEF HISTORY:    [name] is a 79 y/o female with a PMH of HTN, CLL, HLD, and anxiety. PCCM was consulted to manage her acute ITP and L temproal lobe parenchymal hemorrhage.  HISTORY OF PRESENT ILLNESS   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 She was originally diagnosed with CLL on 11/2018, and has been following up with the cancer center for monitoring. Patient has had a previous admissions on 12/17 for CLL associated thrombocytopenia and was treated with Dexamethasone and IVIG. During this admission course it was found that she had diffuse petechia on her upper and lower extremities, with a platelet count of <5, and she was admitted. On 09/16/2019, a Rapid Response was called after patient exhibited aphasia. CT shows showed L  Temporal lobe parenchymal hemorrhage. Neurology and PCCM were consulted on 09/16/2019.  SIGNIFICANT PAST MEDICAL HISTORY   HTN CLL Anxiety Thrombocytosis  SIGNIFICANT EVENTS:  L temporal lobe parenchymal hemorrhage 09/16/2019 Thrombocytopenia 09/15/2019 STUDIES:   CT Head: 09/16/2019 2 cm L temporal Lobe parenchymal hemorrhage with surrounding edema. Trace surrounding subarachnoid hemorrhage.  Additional 3 mm hyperdense focus within R parietal cortex.  Follow up with MRI once stable.    CULTURES:  N/A  ANTIBIOTICS:  N/A  LINES/TUBES:  Peripheral IV LUE 09/15/2019 Peripheral IV R Hand 09/16/2019 External Catheter 09/16/2019 CONSULTANTS:  Neurology  Oncology  CONSTITUTIONAL: BP (!) 117/45   Pulse (!) 106   Temp 99.2 F (37.3 C) (Oral)   Resp 17   Ht 4\' 10"  (1.473 m)   Wt 48.6 kg   SpO2 99%   BMI 22.39 kg/m   No intake/output data recorded.         PHYSICAL EXAM: General:  Sitting comfortably in bed, no acute distress Neuro:  CNII-XII intact HEENT: normocephalic, atraumatic, PERRLA, poor dentition.  Cardiovascular:  Systolic murmur appreciated on physical examination.  Lungs:  Clear to auscultation bilaterally  Abdomen:  BS normal, no pain on palpation.  Musculoskeletal:  5/5 bilaterally in the Upper and Lower extremities.  Skin: Diffuse petechia with bruising on the lower extremities bilaterally.   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   Plan:   Acute ITP:  Patient admitted with thrombocytopenia 2/2 to CLL.  - Oncology consulted, we appreciate their recommendations. - Per oncology, continue prednisone 1 mg/kg QD with platelet transfusion with a goal of >20,000, they are arranging IVIG for today and tomorrow, and follow up with Dr. Irene Limbo, patient's primary hematologist. - Continue trending CBC - Continue to monitor vitals.  - reordered 50 mg  prednisone per oncology recommendations.    Temporal Parenchymal Hemorrhage:  Patient found with 2 m L temporal parenchymal hemorrhage with 3 mm hyperdense focus in the R parietal cortex.  - Neurology was consulted, we appreciate their recommendations.  - BP Goal: SBP <140 - Continue Cleviprex drip - Continue monitoring vitals - Frequent neuro checks - Continued management per oncology/hematology.  - Repeat CT in 6 hours - Continue hydralazine  Q4H PRN - Continue Telemetry    SUMMARY OF TODAY'S PLAN:  Continue treating ITP with platelet transfusion with goal of >20,000 and treating per hematology/oncology, while monitoring vitals with a goal of SBP <140.   Best Practice / Goals of Care / Disposition.   DVT PROPHYLAXIS: ITP With platelet count <5000 SUP: N/A NUTRITION: NPO  MOBILITY: GOALS OF  CARE:Optimize patient in order to complete ADLs FAMILY DISCUSSIONS: Spoke with daughter, Selena Branch, to reaffirm DNR status. Patient reaffirmed that she is DNR.  DISPOSITION:   LABS  Glucose Recent Labs  Lab 09/16/19 1009  GLUCAP 110*    BMET Recent Labs  Lab 09/11/19 1335 09/15/19 0904 09/16/19 0133  NA 141 140 138  K 4.3 3.8 4.2  CL 109 107 108  CO2 25 24 22   BUN 24* 18 17  CREATININE 0.63 0.81 0.64  GLUCOSE 106* 138* 126*    Liver Enzymes Recent Labs  Lab 09/11/19 1335 09/15/19 0904  AST 29 35  ALT 25 22  ALKPHOS 64 53  BILITOT 0.7 0.8  ALBUMIN 3.5 3.5    Electrolytes Recent Labs  Lab 09/11/19 1335 09/15/19 0904 09/16/19 0133  CALCIUM 9.0 9.1 9.0    CBC Recent Labs  Lab 09/15/19 1942 09/16/19 0133 09/16/19 1141  WBC 20.2* 21.9* 24.8*  HGB 11.0* 10.4* 10.2*  HCT 33.2* 31.2* 30.5*  PLT <5* <5* <5*    ABG No results for input(s): PHART, PCO2ART, PO2ART in the last 168 hours.  Coag's Recent Labs  Lab 09/15/19 1008 09/16/19 1141  APTT 24 23*  INR 1.0 1.0    Sepsis Markers No results for input(s): LATICACIDVEN, PROCALCITON, O2SATVEN in the last 168 hours.  Cardiac Enzymes No results for input(s): TROPONINI, PROBNP in the last 168 hours.  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  PAST SURGICAL HISTORY:  She  has no past surgical history on file.  Allergies  Allergen Reactions  . Codeine Nausea Only    No current facility-administered medications on file prior to encounter.   Current Outpatient Medications on File Prior to Encounter  Medication Sig  . amLODipine (NORVASC) 5 MG tablet Take 5 mg by mouth at bedtime.   Marland Kitchen atorvastatin (LIPITOR) 40 MG tablet Take 40 mg by mouth daily.   . Brinzolamide-Brimonidine (SIMBRINZA) 1-0.2 % SUSP Place 1 drop into both eyes daily at 12 noon.  . Calcium  Carbonate-Vitamin D (CALCIUM-D PO) Take 1 tablet by mouth daily.  . Cholecalciferol (VITAMIN D3) 2000 units TABS Take 2,000 Units by mouth daily.   . dorzolamide-timolol (COSOPT) 22.3-6.8 MG/ML ophthalmic solution Place 1 drop into both eyes daily.   . furosemide (LASIX) 20 MG tablet Take 40 mg for 3 days then resume your 20 mg daily (Patient taking differently: Take 20 mg by mouth daily. )  . latanoprost (XALATAN) 0.005 % ophthalmic solution Place 1 drop into both eyes at bedtime.   . Multiple Vitamin (MULTIVITAMIN WITH MINERALS) TABS tablet Take 1 tablet by mouth daily.  . quinapril (ACCUPRIL) 40 MG tablet Take 40 mg by mouth daily.   . sertraline (ZOLOFT) 25 MG tablet Take 25 mg by mouth daily.     FAMILY HISTORY:   Her family history includes Lung cancer in her father; Stroke in her mother. There is no history of Leukemia.  SOCIAL HISTORY:  She  reports that she has never smoked. She has  never used smokeless tobacco. She reports that she does not drink alcohol or use drugs.  REVIEW OF SYSTEMS:    Denies nausea, vomiting, headaches, vision changes, chest pain, abdominal pain, or sudden weakness.

## 2019-09-16 NOTE — Consult Note (Signed)
[place] [phone]   [phone]  CONSULT NOTE  REFERRING PHYSICIAN: Dr. Karmen Bongo  REASON FOR CONSULTATION:  79 years old white female with severe thrombocytopenia  HPI Selena Branch is a 79 y.o. 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 Irene Limbo.  The patient was seen at the cancer center on 09/11/2019 and was found to have low platelets of less than 5.  She received 1 unit of platelet transfusion at that time.  She continues to feel worse and she develop a lot of petechiae in her upper and lower extremities.  She presented to the emergency department yesterday with the same complaint.  Repeat CBC showed platelets count less than 5000.  Her white blood count was still elevated at 13.4 and hemoglobin 11.0 with hematocrit 34.4%.  The patient was treated in the past with steroids as well as IVIG with improvement of her platelets count up to 239,000 on September 05, 2019.  But this declined quickly to less than 5000 on September 11, 2019.  The patient was feeling fine until earlier today when she started having aphasia.  The stroke team was called and she had CT of the head that showed 2.0 cm left temporal lobe parenchymal hemorrhage with mild surrounding edema in addition to trace surrounding subarachnoid hemorrhage.  There was also an additional 3 mm hypodense focus within the right parietal cortex that may reflect an additional tiny parenchymal hemorrhage or other hypodense lesion.  I was consulted for evaluation and recommendation regarding her current condition.  When seen today the patient was able to communicate reasonably well except for the aphasia.  She denied having any other concerning complaints.  She had significant petechiae on her upper and lower extremities. She denied having any current fever or chills.  She has no nausea, vomiting, diarrhea or constipation.  She has no headache  or visual changes.  Previous imaging studies showed no hepatosplenomegaly. HPI  Past Medical History:  Diagnosis Date  . Anxiety   . CLL (chronic lymphocytic leukemia) ([place]) 11/2018  . Glaucoma   . Hyperlipemia   . Hypertension   . Osteoporosis     History reviewed. No pertinent surgical history.  Family History  Problem Relation Age of Onset  . Stroke Mother   . Lung cancer Father   . Leukemia Neg Hx     Social History Social History   Tobacco Use  . Smoking status: Never Smoker  . Smokeless tobacco: Never Used  Substance Use Topics  . Alcohol use: No    Alcohol/week: 0.0 standard drinks  . Drug use: No    Allergies  Allergen Reactions  . Codeine Nausea Only    Current Facility-Administered Medications  Medication Dose Route Frequency Provider Last Rate Last Admin  . acetaminophen (TYLENOL) suppository 650 mg  650 mg Rectal Q6H PRN Karmen Bongo, MD      . clevidipine (CLEVIPREX) infusion 0.5 mg/mL  0-21 mg/hr Intravenous Continuous Amie Portland, MD 16 mL/hr at 09/16/19 1046 8 mg/hr at 09/16/19 1046  . dorzolamide-timolol (COSOPT) 22.3-6.8 MG/ML ophthalmic solution 1 drop  1 drop Both Eyes Daily Karmen Bongo, MD   1 drop at 09/16/19 0803  . hydrALAZINE (APRESOLINE) injection 5 mg  5 mg Intravenous Q4H PRN Karmen Bongo, MD   5 mg at 09/15/19 1716  . Immune Globulin 10% (PRIVIGEN) IV infusion 95 g  2 g/kg Intravenous Q24 Hr x 2 Ensign, Enigma,  MD      . latanoprost (XALATAN) 0.005 % ophthalmic solution 1 drop  1 drop Both Eyes QHS Karmen Bongo, MD   1 drop at 09/15/19 2040  . ondansetron (ZOFRAN) tablet 4 mg  4 mg Oral Q6H PRN Karmen Bongo, MD       Or  . ondansetron [facility]) injection 4 mg  4 mg Intravenous Q6H PRN Karmen Bongo, MD   4 mg at 09/16/19 1125    Review of Systems  Constitutional: positive for fatigue Eyes: negative Ears, nose, mouth, throat, and face: negative Respiratory: negative Cardiovascular: negative Gastrointestinal:  negative Genitourinary:negative Integument/breast: negative Hematologic/lymphatic: positive for easy bruising and petechiae Musculoskeletal:negative Neurological: negative Behavioral/Psych: negative Endocrine: negative Allergic/Immunologic: negative  Physical Exam  [id number], healthy, no distress, well nourished, well developed and comfortable SKIN: positive for: HEAD: Petechiae over the upper and lower extremities EYES: normal, PERRLA, Conjunctiva are pink and non-injected EARS: External ears normal, Canals clear OROPHARYNX:no exudate and no erythema  NECK: supple, no adenopathy, no JVD LYMPH:  no palpable lymphadenopathy, no hepatosplenomegaly BREAST:not examined LUNGS: clear to auscultation , and palpation HEART: regular rate & rhythm and no murmurs ABDOMEN:abdomen soft, non-tender, normal bowel sounds and no masses or organomegaly BACK: Back symmetric, no curvature., No CVA tenderness EXTREMITIES: Significant amount of petechiae on the upper and lower extremities. NEURO: Aphasia.  LABORATORY DATA: Lab Results  Component Value Date   WBC 21.9 (H) 09/16/2019   HGB 10.4 (L) 09/16/2019   HCT 31.2 (L) 09/16/2019   MCV 92.9 09/16/2019   PLT <5 (LL) 09/16/2019    @LASTCHEM @  RADIOGRAPHIC STUDIES: CT CHEST W CONTRAST  Result Date: 08/31/2019 CLINICAL DATA:  Inpatient. CLL, presenting for staging. New anemia and severe thrombocytopenia. EXAM: CT CHEST, ABDOMEN, AND PELVIS WITH CONTRAST TECHNIQUE: Multidetector CT imaging of the chest, abdomen and pelvis was performed following the standard protocol during bolus administration of intravenous contrast. CONTRAST:  194mL OMNIPAQUE IOHEXOL 300 MG/ML  SOLN COMPARISON:  05/07/2010 chest CT angiogram. FINDINGS: CT CHEST FINDINGS Cardiovascular: Normal heart size. No significant pericardial effusion/thickening. Left anterior descending coronary atherosclerosis. Atherosclerotic nonaneurysmal thoracic aorta. Normal caliber pulmonary  arteries. No central pulmonary emboli. Mediastinum/Nodes: No discrete thyroid nodules. Unremarkable esophagus. No pathologically enlarged axillary, mediastinal or hilar lymph nodes. Lungs/Pleura: No pneumothorax. No pleural effusion. No acute consolidative airspace disease or lung masses. A few scattered perifissural and subpleural solid pulmonary nodules in both lungs measuring up to 5 mm in the anterior right lower lobe (series 5/image 75), all stable since 05/07/2010 chest CT, considered benign. No new significant pulmonary nodules. Mild platelike scarring versus atelectasis at the dependent lung bases bilaterally. Musculoskeletal: No aggressive appearing focal osseous lesions. Mild thoracic spondylosis. Chronic mild posterior T10 vertebral compression deformity. CT ABDOMEN PELVIS FINDINGS Hepatobiliary: Normal liver with no liver mass. Normal gallbladder with no radiopaque cholelithiasis. No biliary ductal dilatation. Small periampullary duodenal diverticulum. Pancreas: Normal, with no mass or duct dilation. Spleen: Normal size. No mass. Adrenals/Urinary Tract: Normal adrenals. Normal kidneys with no hydronephrosis and no renal mass. Normal bladder. Stomach/Bowel: Small hiatal hernia. Otherwise normal nondistended stomach. Normal caliber small bowel with no small bowel wall thickening. Normal appendix. Oral contrast transits to the left colon. Moderate sigmoid diverticulosis, with no large bowel wall thickening or significant pericolonic fat stranding. Vascular/Lymphatic: Atherosclerotic nonaneurysmal abdominal aorta. Patent portal, splenic, hepatic and renal veins. Mildly enlarged 1.0 cm right inguinal node (series 3/image 105). Mild bilateral external iliac adenopathy up to 1.1 cm on the right (series 3/image 97) and  1.0 cm on the left (series 3/image 99). Mildly enlarged 1.2 cm left common iliac node (series 3/image 82). Left para-aortic adenopathy up to 1.2 cm (series 3/image 70). Aortocaval adenopathy up to  1.1 cm (series 3/image 73). Reproductive: Normal uterus. No right adnexal mass. Simple 5.3 cm left adnexal cyst (series 3/image 94). Other: No pneumoperitoneum, ascites or focal fluid collection. Musculoskeletal: No aggressive appearing focal osseous lesions. Moderate lower lumbar degenerative changes. IMPRESSION: 1. Mild multistation lymphadenopathy in the retroperitoneum and bilateral pelvis as detailed, compatible with lymphoma. Normal size spleen. No thoracic adenopathy. 2. One vessel coronary atherosclerosis. 3. Moderate sigmoid diverticulosis. 4. Simple 5.3 cm left adnexal cyst. Pelvic ultrasound evaluation recommended. This recommendation follows ACR consensus guidelines: White Paper of the ACR Incidental Findings Committee II on Adnexal Findings. J Am Coll Radiol 2013:10:675-681. 5.  Aortic Atherosclerosis (ICD10-I70.0). Electronically Signed   By: Ilona Sorrel M.D.   On: 08/31/2019 12:14   CT ABDOMEN PELVIS W CONTRAST  Result Date: 08/31/2019 CLINICAL DATA:  Inpatient. CLL, presenting for staging. New anemia and severe thrombocytopenia. EXAM: CT CHEST, ABDOMEN, AND PELVIS WITH CONTRAST TECHNIQUE: Multidetector CT imaging of the chest, abdomen and pelvis was performed following the standard protocol during bolus administration of intravenous contrast. CONTRAST:  140mL OMNIPAQUE IOHEXOL 300 MG/ML  SOLN COMPARISON:  05/07/2010 chest CT angiogram. FINDINGS: CT CHEST FINDINGS Cardiovascular: Normal heart size. No significant pericardial effusion/thickening. Left anterior descending coronary atherosclerosis. Atherosclerotic nonaneurysmal thoracic aorta. Normal caliber pulmonary arteries. No central pulmonary emboli. Mediastinum/Nodes: No discrete thyroid nodules. Unremarkable esophagus. No pathologically enlarged axillary, mediastinal or hilar lymph nodes. Lungs/Pleura: No pneumothorax. No pleural effusion. No acute consolidative airspace disease or lung masses. A few scattered perifissural and subpleural  solid pulmonary nodules in both lungs measuring up to 5 mm in the anterior right lower lobe (series 5/image 75), all stable since 05/07/2010 chest CT, considered benign. No new significant pulmonary nodules. Mild platelike scarring versus atelectasis at the dependent lung bases bilaterally. Musculoskeletal: No aggressive appearing focal osseous lesions. Mild thoracic spondylosis. Chronic mild posterior T10 vertebral compression deformity. CT ABDOMEN PELVIS FINDINGS Hepatobiliary: Normal liver with no liver mass. Normal gallbladder with no radiopaque cholelithiasis. No biliary ductal dilatation. Small periampullary duodenal diverticulum. Pancreas: Normal, with no mass or duct dilation. Spleen: Normal size. No mass. Adrenals/Urinary Tract: Normal adrenals. Normal kidneys with no hydronephrosis and no renal mass. Normal bladder. Stomach/Bowel: Small hiatal hernia. Otherwise normal nondistended stomach. Normal caliber small bowel with no small bowel wall thickening. Normal appendix. Oral contrast transits to the left colon. Moderate sigmoid diverticulosis, with no large bowel wall thickening or significant pericolonic fat stranding. Vascular/Lymphatic: Atherosclerotic nonaneurysmal abdominal aorta. Patent portal, splenic, hepatic and renal veins. Mildly enlarged 1.0 cm right inguinal node (series 3/image 105). Mild bilateral external iliac adenopathy up to 1.1 cm on the right (series 3/image 97) and 1.0 cm on the left (series 3/image 99). Mildly enlarged 1.2 cm left common iliac node (series 3/image 82). Left para-aortic adenopathy up to 1.2 cm (series 3/image 70). Aortocaval adenopathy up to 1.1 cm (series 3/image 73). Reproductive: Normal uterus. No right adnexal mass. Simple 5.3 cm left adnexal cyst (series 3/image 94). Other: No pneumoperitoneum, ascites or focal fluid collection. Musculoskeletal: No aggressive appearing focal osseous lesions. Moderate lower lumbar degenerative changes. IMPRESSION: 1. Mild  multistation lymphadenopathy in the retroperitoneum and bilateral pelvis as detailed, compatible with lymphoma. Normal size spleen. No thoracic adenopathy. 2. One vessel coronary atherosclerosis. 3. Moderate sigmoid diverticulosis. 4. Simple 5.3 cm left adnexal cyst.  Pelvic ultrasound evaluation recommended. This recommendation follows ACR consensus guidelines: White Paper of the ACR Incidental Findings Committee II on Adnexal Findings. J Am Coll Radiol 2013:10:675-681. 5.  Aortic Atherosclerosis (ICD10-I70.0). Electronically Signed   By: Ilona Sorrel M.D.   On: 08/31/2019 12:14   CT HEAD CODE STROKE WO CONTRAST  Result Date: 09/16/2019 CLINICAL DATA:  Code stroke. Neuro deficit, acute, stroke suspected. Additional history provided: Last known normal 9:50 AM, slurred speech and confusion EXAM: CT HEAD WITHOUT CONTRAST TECHNIQUE: Contiguous axial images were obtained from the base of the skull through the vertex without intravenous contrast. COMPARISON:  CT head 08/09/2018, brain MRI 01/14/2016 FINDINGS: Brain: There is an acute parenchymal hemorrhage centered within the left temporal lobe with mild surrounding edema. The hemorrhage measures 1.6 x 1.6 x 2.0 cm. Trace surrounding subarachnoid hemorrhage. Minimal regional mass effect. No effacement of the ventricular system or midline shift. Additional 3 mm hyperdense focus within the cortex of the right parietal lobe (series 6, image 11) (series 3, image 17). No extra-axial fluid collection. Mild ill-defined hypoattenuation within the cerebral white matter is nonspecific, but consistent with chronic small vessel ischemic disease. Mild generalized parenchymal atrophy. Vascular: No hyperdense vessel.  Atherosclerotic calcifications. Skull: Normal. Negative for fracture or focal lesion. Sinuses/Orbits: Visualized orbits demonstrate no acute abnormality. Other: No significant paranasal sinus disease or mastoid effusion at the imaged levels. These results were called by  telephone at the time of interpretation on 09/16/2019 at 10:32 am to provider Dr. Rory Percy, who verbally acknowledged these results. IMPRESSION: 2 cm left temporal lobe parenchymal hemorrhage with mild surrounding edema. Trace surrounding subarachnoid hemorrhage. Contrast-enhanced brain MRI is recommended once the hematoma involutes to assess for an underlying lesion. Additional 3 mm hyperdense focus within the right parietal cortex. This may reflect an additional tiny parenchymal hemorrhage or other hyperdense lesion. This too should have MRI follow-up. Generalized parenchymal atrophy and chronic small vessel ischemic disease. Electronically Signed   By: Kellie Simmering DO   On: 09/16/2019 10:34   VAS [place] LOWER EXTREMITY VENOUS (DVT)  Result Date: 09/01/2019  Lower Venous Study Indications: Edema.  Comparison Study: no prior Performing Technologist: June Leap RDMS, RVT  Examination Guidelines: A complete evaluation includes B-mode imaging, spectral Doppler, color Doppler, and power Doppler as needed of all accessible portions of each vessel. Bilateral testing is considered an integral part of a complete examination. Limited examinations for reoccurring indications may be performed as noted.  +-----+---------------+---------+-----------+----------+--------------+ RIGHTCompressibilityPhasicitySpontaneityPropertiesThrombus Aging +-----+---------------+---------+-----------+----------+--------------+ CFV  Full           Yes      Yes                                 +-----+---------------+---------+-----------+----------+--------------+   +---------+---------------+---------+-----------+----------+--------------+ LEFT     CompressibilityPhasicitySpontaneityPropertiesThrombus Aging +---------+---------------+---------+-----------+----------+--------------+ CFV      Full           Yes      Yes                                  +---------+---------------+---------+-----------+----------+--------------+ SFJ      Full                                                        +---------+---------------+---------+-----------+----------+--------------+  FV Prox  Full                                                        +---------+---------------+---------+-----------+----------+--------------+ FV Mid   Full                                                        +---------+---------------+---------+-----------+----------+--------------+ FV DistalFull                                                        +---------+---------------+---------+-----------+----------+--------------+ PFV      Full                                                        +---------+---------------+---------+-----------+----------+--------------+ POP      Full           Yes      Yes                                 +---------+---------------+---------+-----------+----------+--------------+ PTV      Full                                                        +---------+---------------+---------+-----------+----------+--------------+ PERO     Full                                                        +---------+---------------+---------+-----------+----------+--------------+     Summary: Right: No evidence of common femoral vein obstruction. Left: There is no evidence of deep vein thrombosis in the lower extremity. No cystic structure found in the popliteal fossa.  *See table(s) above for measurements and observations. Electronically signed by Harold Barban MD on 09/01/2019 at 4:38:59 PM.    Final     ASSESSMENT: This is a very pleasant 79 years old white female with history of chronic lymphocytic leukemia, CLL as well as immune mediated thrombocytopenia, ITP with current platelet count of less than 5000.   PLAN: I had a lengthy discussion with the patient today about her current condition and treatment  options. Because of the current situation and the intracranial bleed, we will continue supportive care with platelet transfusion to keep her platelets count above 20,000 if possible.  Unfortunately most of her platelets will be destroyed after transfusion because of the immune mediated process but with the treatment with steroids as well as IVIG we hope that she may maintain reasonable number of  platelets count onto recovery of her own platelet. I recommended for the patient to continue prednisone 1 mg/KG on daily basis and this will be tapered after the patient has normal platelets count. I will also arrange for the patient to receive high-dose IVIG today and tomorrow. I will leave the additional recommendation regarding treatment with Rituxan or Promacta to Dr. Irene Limbo, her primary hematologist.  He is expected to see the patient tomorrow for additional recommendation. For the recent hemorrhagic stroke, she will need to have good control of her blood pressure during this time. The patient voices understanding of current disease status and treatment options and is in agreement with the current care plan.  All questions were answered. The patient knows to call the clinic with any problems, questions or concerns. We can certainly see the patient much sooner if necessary.  Thank you so much for allowing me to participate in the care of Selena Branch. I will continue to follow up the patient with you and assist in her care.  Please call if you have any additional questions.   Disclaimer: This note was dictated with voice recognition software. Similar sounding words can inadvertently be transcribed and may not be corrected upon review.   Eilleen Kempf September 16, 2019, 11:55 AM

## 2019-09-17 ENCOUNTER — Inpatient Hospital Stay: Payer: PPO

## 2019-09-17 ENCOUNTER — Inpatient Hospital Stay: Payer: PPO | Admitting: Hematology

## 2019-09-17 DIAGNOSIS — I619 Nontraumatic intracerebral hemorrhage, unspecified: Secondary | ICD-10-CM | POA: Insufficient documentation

## 2019-09-17 DIAGNOSIS — I10 Essential (primary) hypertension: Secondary | ICD-10-CM

## 2019-09-17 DIAGNOSIS — D649 Anemia, unspecified: Secondary | ICD-10-CM

## 2019-09-17 DIAGNOSIS — J9601 Acute respiratory failure with hypoxia: Secondary | ICD-10-CM

## 2019-09-17 DIAGNOSIS — C9110 Chronic lymphocytic leukemia of B-cell type not having achieved remission: Secondary | ICD-10-CM

## 2019-09-17 LAB — COMPREHENSIVE METABOLIC PANEL
ALT: 15 U/L (ref 0–44)
AST: 31 U/L (ref 15–41)
Albumin: 2.8 g/dL — ABNORMAL LOW (ref 3.5–5.0)
Alkaline Phosphatase: 43 U/L (ref 38–126)
Anion gap: 6 (ref 5–15)
BUN: 18 mg/dL (ref 8–23)
CO2: 21 mmol/L — ABNORMAL LOW (ref 22–32)
Calcium: 7.9 mg/dL — ABNORMAL LOW (ref 8.9–10.3)
Chloride: 104 mmol/L (ref 98–111)
Creatinine, Ser: 0.77 mg/dL (ref 0.44–1.00)
GFR calc Af Amer: >60 mL/min (ref >60)
GFR calc non Af Amer: >60 mL/min (ref >60)
Glucose, Bld: 118 mg/dL — ABNORMAL HIGH (ref 70–99)
Potassium: 3.7 mmol/L (ref 3.5–5.1)
Sodium: 131 mmol/L — ABNORMAL LOW (ref 135–145)
Total Bilirubin: 0.6 mg/dL (ref 0.3–1.2)
Total Protein: 7.9 g/dL (ref 6.5–8.1)

## 2019-09-17 LAB — CBC WITH DIFFERENTIAL/PLATELET
Abs Immature Granulocytes: 0.05 10*3/uL (ref 0.00–0.07)
Abs Immature Granulocytes: 0 10*3/uL (ref 0.00–0.07)
Basophils Absolute: 0 10*3/uL (ref 0.0–0.1)
Basophils Absolute: 0.1 10*3/uL (ref 0.0–0.1)
Basophils Absolute: 0.2 10*3/uL — ABNORMAL HIGH (ref 0.0–0.1)
Basophils Relative: 0 %
Basophils Relative: 0 %
Basophils Relative: 1 %
Eosinophils Absolute: 0 10*3/uL (ref 0.0–0.5)
Eosinophils Absolute: 0.3 10*3/uL (ref 0.0–0.5)
Eosinophils Absolute: 0 10*3/uL (ref 0.0–0.5)
Eosinophils Relative: 0 %
Eosinophils Relative: 1 %
Eosinophils Relative: 0 %
HCT: 30.5 % — ABNORMAL LOW (ref 36.0–46.0)
HCT: 26.6 % — ABNORMAL LOW (ref 36.0–46.0)
HCT: 25.5 % — ABNORMAL LOW (ref 36.0–46.0)
Hemoglobin: 10.2 g/dL — ABNORMAL LOW (ref 12.0–15.0)
Hemoglobin: 8.8 g/dL — ABNORMAL LOW (ref 12.0–15.0)
Hemoglobin: 8.5 g/dL — ABNORMAL LOW (ref 12.0–15.0)
Immature Granulocytes: 0 %
Lymphocytes Relative: 62 %
Lymphocytes Relative: 60 %
Lymphocytes Relative: 38 %
Lymphs Abs: 15.4 10*3/uL — ABNORMAL HIGH (ref 0.7–4.0)
Lymphs Abs: 11 10*3/uL — ABNORMAL HIGH (ref 0.7–4.0)
Lymphs Abs: 7.1 10*3/uL — ABNORMAL HIGH (ref 0.7–4.0)
MCH: 31.1 pg (ref 26.0–34.0)
MCH: 31.4 pg (ref 26.0–34.0)
MCH: 31 pg (ref 26.0–34.0)
MCHC: 33.4 g/dL (ref 30.0–36.0)
MCHC: 33.1 g/dL (ref 30.0–36.0)
MCHC: 33.3 g/dL (ref 30.0–36.0)
MCV: 93 fL (ref 80.0–100.0)
MCV: 95 fL (ref 80.0–100.0)
MCV: 93.1 fL (ref 80.0–100.0)
Monocytes Absolute: 2.2 10*3/uL — ABNORMAL HIGH (ref 0.1–1.0)
Monocytes Absolute: 2.2 10*3/uL — ABNORMAL HIGH (ref 0.1–1.0)
Monocytes Absolute: 0 10*3/uL — ABNORMAL LOW (ref 0.1–1.0)
Monocytes Relative: 9 %
Monocytes Relative: 12 %
Monocytes Relative: 0 %
Neutro Abs: 7.2 10*3/uL (ref 1.7–7.7)
Neutro Abs: 5 10*3/uL (ref 1.7–7.7)
Neutro Abs: 11.3 10*3/uL — ABNORMAL HIGH (ref 1.7–7.7)
Neutrophils Relative %: 29 %
Neutrophils Relative %: 27 %
Neutrophils Relative %: 61 %
Platelets: <5 10*3/uL — CL (ref 150–400)
Platelets: 27 10*3/uL — CL (ref 150–400)
Platelets: 62 10*3/uL — ABNORMAL LOW (ref 150–400)
RBC: 3.28 MIL/uL — ABNORMAL LOW (ref 3.87–5.11)
RBC: 2.8 MIL/uL — ABNORMAL LOW (ref 3.87–5.11)
RBC: 2.74 MIL/uL — ABNORMAL LOW (ref 3.87–5.11)
RDW: 14.2 % (ref 11.5–15.5)
RDW: 14.2 % (ref 11.5–15.5)
RDW: 14.2 % (ref 11.5–15.5)
WBC: 24.8 10*3/uL — ABNORMAL HIGH (ref 4.0–10.5)
WBC: 18.5 10*3/uL — ABNORMAL HIGH (ref 4.0–10.5)
WBC: 18.6 10*3/uL — ABNORMAL HIGH (ref 4.0–10.5)
nRBC: 0 % (ref 0.0–0.2)
nRBC: 0 % (ref 0.0–0.2)
nRBC: 0 % (ref 0.0–0.2)
nRBC: 0 /100 WBC

## 2019-09-17 LAB — PREPARE PLATELET PHERESIS
Unit division: 0
Unit division: 0

## 2019-09-17 LAB — BPAM PLATELET PHERESIS
Blood Product Expiration Date: 202101052359
Blood Product Expiration Date: 202101052359
ISSUE DATE / TIME: 202101031327
ISSUE DATE / TIME: 202101031327
Unit Type and Rh: 6200
Unit Type and Rh: 7300

## 2019-09-17 LAB — MAGNESIUM: Magnesium: 1.8 mg/dL (ref 1.7–2.4)

## 2019-09-17 LAB — LACTIC ACID, PLASMA: Lactic Acid, Venous: 0.8 mmol/L (ref 0.5–1.9)

## 2019-09-17 LAB — PHOSPHORUS: Phosphorus: 3.2 mg/dL (ref 2.5–4.6)

## 2019-09-17 LAB — IMMATURE PLATELET FRACTION: Immature Platelet Fraction: 3.1 % (ref 1.2–8.6)

## 2019-09-17 MED ORDER — LABETALOL HCL 5 MG/ML IV SOLN
10.0000 mg | INTRAVENOUS | Status: DC | PRN
  Administered 2019-09-19: 10 mg via INTRAVENOUS
  Filled 2019-09-17 (×2): qty 4

## 2019-09-17 MED ORDER — SODIUM CHLORIDE 0.9% IV SOLUTION: Freq: Once | INTRAVENOUS | Status: AC

## 2019-09-17 MED ORDER — SODIUM CHLORIDE 0.9% FLUSH: 10.0000 mL | INTRAVENOUS | Status: DC | PRN

## 2019-09-17 MED ORDER — HEPARIN SOD (PORK) LOCK FLUSH 100 UNIT/ML IV SOLN
250.0000 [IU] | INTRAVENOUS | Status: DC | PRN
  Filled 2019-09-17: qty 2.5

## 2019-09-17 MED ORDER — SERTRALINE HCL 50 MG PO TABS: 25.0000 mg | ORAL_TABLET | Freq: Every day | ORAL | Status: DC

## 2019-09-17 MED ORDER — HEPARIN SOD (PORK) LOCK FLUSH 100 UNIT/ML IV SOLN
500.0000 [IU] | Freq: Every day | INTRAVENOUS | Status: DC | PRN
  Filled 2019-09-17: qty 5

## 2019-09-17 MED ORDER — AMLODIPINE BESYLATE 5 MG PO TABS: 5.0000 mg | ORAL_TABLET | Freq: Every day | ORAL | Status: DC

## 2019-09-17 MED ORDER — AMLODIPINE BESYLATE 10 MG PO TABS
10.0000 mg | ORAL_TABLET | Freq: Every day | ORAL | Status: DC
  Administered 2019-09-17 – 2019-09-19 (×3): 10 mg via ORAL
  Filled 2019-09-17 (×3): qty 1

## 2019-09-17 MED ORDER — SODIUM CHLORIDE 0.9% FLUSH: 3.0000 mL | INTRAVENOUS | Status: DC | PRN

## 2019-09-17 MED ORDER — SODIUM CHLORIDE 0.9% IV SOLUTION: 250.0000 mL | Freq: Once | INTRAVENOUS | Status: AC

## 2019-09-17 MED ORDER — HYDRALAZINE HCL 20 MG/ML IJ SOLN
5.0000 mg | INTRAMUSCULAR | Status: DC | PRN
  Administered 2019-09-19: 5 mg via INTRAVENOUS
  Filled 2019-09-17: qty 1

## 2019-09-17 MED ORDER — ACETAMINOPHEN 325 MG PO TABS
650.0000 mg | ORAL_TABLET | Freq: Once | ORAL | Status: AC
  Administered 2019-09-17: 650 mg via ORAL
  Filled 2019-09-17: qty 2

## 2019-09-17 MED ORDER — ATORVASTATIN CALCIUM 40 MG PO TABS
40.0000 mg | ORAL_TABLET | Freq: Every day | ORAL | Status: DC
  Administered 2019-09-17 – 2019-09-20 (×4): 40 mg via ORAL
  Filled 2019-09-17 (×4): qty 1

## 2019-09-17 MED ORDER — LISINOPRIL 10 MG PO TABS
10.0000 mg | ORAL_TABLET | Freq: Every day | ORAL | Status: DC
  Administered 2019-09-17 – 2019-09-20 (×4): 10 mg via ORAL
  Filled 2019-09-17 (×4): qty 1

## 2019-09-17 NOTE — Progress Notes (Addendum)
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  She was started on steroids and IVIG yesterday.  Receiving a platelet transfusion today at the time my visit.  The patient reports that she no longer has any bleeding.  Still has petechiae.  She denies headaches and vision changes.  She has no other complaints.  REVIEW OF SYSTEMS:   As noted in the HPI.  I have reviewed the past medical history, past surgical history, social history and family history with the patient and they are unchanged from previous note.   PHYSICAL EXAMINATION:  Vitals:   09/17/19 1300 09/17/19 1306  BP: 113/61 113/61  Pulse: 95 92  Resp: 19 (!) 25  Temp:  99 F (37.2 C)  SpO2: 99% 95%   Filed Weights   09/15/19 1609 09/16/19 1300  Weight: 105 lb 2.6 oz (47.7 kg) 107 lb 2.3 oz (48.6 kg)    Intake/Output from previous day: 01/03 0701 - 01/04 0700 In: 2134.2 [P.O.:320; I.V.:601.5; Blood:1212.7] Out: 1600 [Urine:1600]  GENERAL:alert, no distress and comfortable SKIN: Multiple bruises and petechiae on her arms, chest, and legs EYES: normal, Conjunctiva are pink and non-injected, sclera clear OROPHARYNX:no exudate, no erythema and lips, buccal mucosa, and tongue normal  NECK: supple, thyroid normal size, non-tender, without nodularity LYMPH:  no palpable lymphadenopathy in the cervical, axillary or inguinal LUNGS: clear to auscultation and percussion with normal breathing effort HEART: regular rate & rhythm and no murmurs and no lower extremity edema ABDOMEN:abdomen soft, non-tender and normal bowel sounds Musculoskeletal:no cyanosis of digits and no clubbing  NEURO: alert & oriented x 3 with fluent speech, no focal motor/sensory deficits  LABORATORY DATA:  I have reviewed the data as listed CMP Latest Ref Rng & Units 09/17/2019 09/16/2019 09/15/2019  Glucose 70 - 99 mg/dL 118(H) 126(H) 138(H)  BUN 8 - 23 mg/dL 18 17 18    Creatinine 0.44 - 1.00 mg/dL 0.77 0.64 0.81  Sodium 135 - 145 mmol/L 131(L) 138 140  Potassium 3.5 - 5.1 mmol/L 3.7 4.2 3.8  Chloride 98 - 111 mmol/L 104 108 107  CO2 22 - 32 mmol/L 21(L) 22 24  Calcium 8.9 - 10.3 mg/dL 7.9(L) 9.0 9.1  Total Protein 6.5 - 8.1 g/dL 7.9 - 6.7  Total Bilirubin 0.3 - 1.2 mg/dL 0.6 - 0.8  Alkaline Phos 38 - 126 U/L 43 - 53  AST 15 - 41 U/L 31 - 35  ALT 0 - 44 U/L 15 - 22    Lab Results  Component Value Date   WBC 18.5 (H) 09/17/2019   HGB 8.8 (L) 09/17/2019   HCT 26.6 (L) 09/17/2019   MCV 95.0 09/17/2019   PLT 27 (LL) 09/17/2019   NEUTROABS 5.0 09/17/2019    CT HEAD WO CONTRAST  Result Date: 09/16/2019 CLINICAL DATA:  Follow-up intracranial hemorrhage. EXAM: CT HEAD WITHOUT CONTRAST TECHNIQUE: Contiguous axial images were obtained from the base of the skull through the vertex without intravenous contrast. COMPARISON:  Earlier same day.  08/09/2018. FINDINGS: Brain: No change since earlier today. Maximal 21 mm in diameter intraparenchymal hemorrhage in the lateral left temporal lobe with vasogenic edema and a small amount of adjacent subarachnoid blood. Second 3.5 mm hyperdense focus present along the gyral surface of the right frontal parietal junction, axial image 17, also unchanged. Considerable concern for hemorrhagic metastases in this case. No evidence of midline shift. No sign of typical ischemic infarction. No hydrocephalus or extra-axial  collection. Vascular: There is atherosclerotic calcification of the major vessels at the base of the brain. Skull: Negative Sinuses/Orbits: Clear/normal Other: None IMPRESSION: No change since earlier today. 21 mm hemorrhage or hemorrhagic lesion in the lateral left temporal lobe with surrounding vasogenic edema and a small amount of adjacent subarachnoid hemorrhage. Second 3.5 mm hyperdense focus affecting the gyral surface in the right frontoparietal junction. Presence of this second lesion elevates the likelihood of  metastatic disease. This would be an unusual primary hemorrhage. Electronically Signed   By: Nelson Chimes M.D.   On: 09/16/2019 16:50   CT CHEST W CONTRAST  Result Date: 08/31/2019 CLINICAL DATA:  Inpatient. CLL, presenting for staging. New anemia and severe thrombocytopenia. EXAM: CT CHEST, ABDOMEN, AND PELVIS WITH CONTRAST TECHNIQUE: Multidetector CT imaging of the chest, abdomen and pelvis was performed following the standard protocol during bolus administration of intravenous contrast. CONTRAST:  137mL OMNIPAQUE IOHEXOL 300 MG/ML  SOLN COMPARISON:  05/07/2010 chest CT angiogram. FINDINGS: CT CHEST FINDINGS Cardiovascular: Normal heart size. No significant pericardial effusion/thickening. Left anterior descending coronary atherosclerosis. Atherosclerotic nonaneurysmal thoracic aorta. Normal caliber pulmonary arteries. No central pulmonary emboli. Mediastinum/Nodes: No discrete thyroid nodules. Unremarkable esophagus. No pathologically enlarged axillary, mediastinal or hilar lymph nodes. Lungs/Pleura: No pneumothorax. No pleural effusion. No acute consolidative airspace disease or lung masses. A few scattered perifissural and subpleural solid pulmonary nodules in both lungs measuring up to 5 mm in the anterior right lower lobe (series 5/image 75), all stable since 05/07/2010 chest CT, considered benign. No new significant pulmonary nodules. Mild platelike scarring versus atelectasis at the dependent lung bases bilaterally. Musculoskeletal: No aggressive appearing focal osseous lesions. Mild thoracic spondylosis. Chronic mild posterior T10 vertebral compression deformity. CT ABDOMEN PELVIS FINDINGS Hepatobiliary: Normal liver with no liver mass. Normal gallbladder with no radiopaque cholelithiasis. No biliary ductal dilatation. Small periampullary duodenal diverticulum. Pancreas: Normal, with no mass or duct dilation. Spleen: Normal size. No mass. Adrenals/Urinary Tract: Normal adrenals. Normal kidneys with no  hydronephrosis and no renal mass. Normal bladder. Stomach/Bowel: Small hiatal hernia. Otherwise normal nondistended stomach. Normal caliber small bowel with no small bowel wall thickening. Normal appendix. Oral contrast transits to the left colon. Moderate sigmoid diverticulosis, with no large bowel wall thickening or significant pericolonic fat stranding. Vascular/Lymphatic: Atherosclerotic nonaneurysmal abdominal aorta. Patent portal, splenic, hepatic and renal veins. Mildly enlarged 1.0 cm right inguinal node (series 3/image 105). Mild bilateral external iliac adenopathy up to 1.1 cm on the right (series 3/image 97) and 1.0 cm on the left (series 3/image 99). Mildly enlarged 1.2 cm left common iliac node (series 3/image 82). Left para-aortic adenopathy up to 1.2 cm (series 3/image 70). Aortocaval adenopathy up to 1.1 cm (series 3/image 73). Reproductive: Normal uterus. No right adnexal mass. Simple 5.3 cm left adnexal cyst (series 3/image 94). Other: No pneumoperitoneum, ascites or focal fluid collection. Musculoskeletal: No aggressive appearing focal osseous lesions. Moderate lower lumbar degenerative changes. IMPRESSION: 1. Mild multistation lymphadenopathy in the retroperitoneum and bilateral pelvis as detailed, compatible with lymphoma. Normal size spleen. No thoracic adenopathy. 2. One vessel coronary atherosclerosis. 3. Moderate sigmoid diverticulosis. 4. Simple 5.3 cm left adnexal cyst. Pelvic ultrasound evaluation recommended. This recommendation follows ACR consensus guidelines: White Paper of the ACR Incidental Findings Committee II on Adnexal Findings. J Am Coll Radiol 2013:10:675-681. 5.  Aortic Atherosclerosis (ICD10-I70.0). Electronically Signed   By: Ilona Sorrel M.D.   On: 08/31/2019 12:14   CT ABDOMEN PELVIS W CONTRAST  Result Date: 08/31/2019 CLINICAL DATA:  Inpatient. CLL,  presenting for staging. New anemia and severe thrombocytopenia. EXAM: CT CHEST, ABDOMEN, AND PELVIS WITH CONTRAST  TECHNIQUE: Multidetector CT imaging of the chest, abdomen and pelvis was performed following the standard protocol during bolus administration of intravenous contrast. CONTRAST:  156mL OMNIPAQUE IOHEXOL 300 MG/ML  SOLN COMPARISON:  05/07/2010 chest CT angiogram. FINDINGS: CT CHEST FINDINGS Cardiovascular: Normal heart size. No significant pericardial effusion/thickening. Left anterior descending coronary atherosclerosis. Atherosclerotic nonaneurysmal thoracic aorta. Normal caliber pulmonary arteries. No central pulmonary emboli. Mediastinum/Nodes: No discrete thyroid nodules. Unremarkable esophagus. No pathologically enlarged axillary, mediastinal or hilar lymph nodes. Lungs/Pleura: No pneumothorax. No pleural effusion. No acute consolidative airspace disease or lung masses. A few scattered perifissural and subpleural solid pulmonary nodules in both lungs measuring up to 5 mm in the anterior right lower lobe (series 5/image 75), all stable since 05/07/2010 chest CT, considered benign. No new significant pulmonary nodules. Mild platelike scarring versus atelectasis at the dependent lung bases bilaterally. Musculoskeletal: No aggressive appearing focal osseous lesions. Mild thoracic spondylosis. Chronic mild posterior T10 vertebral compression deformity. CT ABDOMEN PELVIS FINDINGS Hepatobiliary: Normal liver with no liver mass. Normal gallbladder with no radiopaque cholelithiasis. No biliary ductal dilatation. Small periampullary duodenal diverticulum. Pancreas: Normal, with no mass or duct dilation. Spleen: Normal size. No mass. Adrenals/Urinary Tract: Normal adrenals. Normal kidneys with no hydronephrosis and no renal mass. Normal bladder. Stomach/Bowel: Small hiatal hernia. Otherwise normal nondistended stomach. Normal caliber small bowel with no small bowel wall thickening. Normal appendix. Oral contrast transits to the left colon. Moderate sigmoid diverticulosis, with no large bowel wall thickening or significant  pericolonic fat stranding. Vascular/Lymphatic: Atherosclerotic nonaneurysmal abdominal aorta. Patent portal, splenic, hepatic and renal veins. Mildly enlarged 1.0 cm right inguinal node (series 3/image 105). Mild bilateral external iliac adenopathy up to 1.1 cm on the right (series 3/image 97) and 1.0 cm on the left (series 3/image 99). Mildly enlarged 1.2 cm left common iliac node (series 3/image 82). Left para-aortic adenopathy up to 1.2 cm (series 3/image 70). Aortocaval adenopathy up to 1.1 cm (series 3/image 73). Reproductive: Normal uterus. No right adnexal mass. Simple 5.3 cm left adnexal cyst (series 3/image 94). Other: No pneumoperitoneum, ascites or focal fluid collection. Musculoskeletal: No aggressive appearing focal osseous lesions. Moderate lower lumbar degenerative changes. IMPRESSION: 1. Mild multistation lymphadenopathy in the retroperitoneum and bilateral pelvis as detailed, compatible with lymphoma. Normal size spleen. No thoracic adenopathy. 2. One vessel coronary atherosclerosis. 3. Moderate sigmoid diverticulosis. 4. Simple 5.3 cm left adnexal cyst. Pelvic ultrasound evaluation recommended. This recommendation follows ACR consensus guidelines: White Paper of the ACR Incidental Findings Committee II on Adnexal Findings. J Am Coll Radiol 2013:10:675-681. 5.  Aortic Atherosclerosis (ICD10-I70.0). Electronically Signed   By: Ilona Sorrel M.D.   On: 08/31/2019 12:14   CT HEAD CODE STROKE WO CONTRAST  Result Date: 09/16/2019 CLINICAL DATA:  Code stroke. Neuro deficit, acute, stroke suspected. Additional history provided: Last known normal 9:50 AM, slurred speech and confusion EXAM: CT HEAD WITHOUT CONTRAST TECHNIQUE: Contiguous axial images were obtained from the base of the skull through the vertex without intravenous contrast. COMPARISON:  CT head 08/09/2018, brain MRI 01/14/2016 FINDINGS: Brain: There is an acute parenchymal hemorrhage centered within the left temporal lobe with mild  surrounding edema. The hemorrhage measures 1.6 x 1.6 x 2.0 cm. Trace surrounding subarachnoid hemorrhage. Minimal regional mass effect. No effacement of the ventricular system or midline shift. Additional 3 mm hyperdense focus within the cortex of the right parietal lobe (series 6, image 11) (series 3,  image 17). No extra-axial fluid collection. Mild ill-defined hypoattenuation within the cerebral white matter is nonspecific, but consistent with chronic small vessel ischemic disease. Mild generalized parenchymal atrophy. Vascular: No hyperdense vessel.  Atherosclerotic calcifications. Skull: Normal. Negative for fracture or focal lesion. Sinuses/Orbits: Visualized orbits demonstrate no acute abnormality. Other: No significant paranasal sinus disease or mastoid effusion at the imaged levels. These results were called by telephone at the time of interpretation on 09/16/2019 at 10:32 am to provider Dr. Rory Percy, who verbally acknowledged these results. IMPRESSION: 2 cm left temporal lobe parenchymal hemorrhage with mild surrounding edema. Trace surrounding subarachnoid hemorrhage. Contrast-enhanced brain MRI is recommended once the hematoma involutes to assess for an underlying lesion. Additional 3 mm hyperdense focus within the right parietal cortex. This may reflect an additional tiny parenchymal hemorrhage or other hyperdense lesion. This too should have MRI follow-up. Generalized parenchymal atrophy and chronic small vessel ischemic disease. Electronically Signed   By: Kellie Simmering DO   On: 09/16/2019 10:34   VAS [place] LOWER EXTREMITY VENOUS (DVT)  Result Date: 09/01/2019  Lower Venous Study Indications: Edema.  Comparison Study: no prior Performing Technologist: June Leap RDMS, RVT  Examination Guidelines: A complete evaluation includes B-mode imaging, spectral Doppler, color Doppler, and power Doppler as needed of all accessible portions of each vessel. Bilateral testing is considered an integral part of a  complete examination. Limited examinations for reoccurring indications may be performed as noted.  +-----+---------------+---------+-----------+----------+--------------+ RIGHTCompressibilityPhasicitySpontaneityPropertiesThrombus Aging +-----+---------------+---------+-----------+----------+--------------+ CFV  Full           Yes      Yes                                 +-----+---------------+---------+-----------+----------+--------------+   +---------+---------------+---------+-----------+----------+--------------+ LEFT     CompressibilityPhasicitySpontaneityPropertiesThrombus Aging +---------+---------------+---------+-----------+----------+--------------+ CFV      Full           Yes      Yes                                 +---------+---------------+---------+-----------+----------+--------------+ SFJ      Full                                                        +---------+---------------+---------+-----------+----------+--------------+ FV Prox  Full                                                        +---------+---------------+---------+-----------+----------+--------------+ FV Mid   Full                                                        +---------+---------------+---------+-----------+----------+--------------+ FV DistalFull                                                        +---------+---------------+---------+-----------+----------+--------------+  PFV      Full                                                        +---------+---------------+---------+-----------+----------+--------------+ POP      Full           Yes      Yes                                 +---------+---------------+---------+-----------+----------+--------------+ PTV      Full                                                        +---------+---------------+---------+-----------+----------+--------------+ PERO     Full                                                         +---------+---------------+---------+-----------+----------+--------------+     Summary: Right: No evidence of common femoral vein obstruction. Left: There is no evidence of deep vein thrombosis in the lower extremity. No cystic structure found in the popliteal fossa.  *See table(s) above for measurements and observations. Electronically signed by Harold Barban MD on 09/01/2019 at 4:38:59 PM.    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   Patient had excellent response with previous induction dexamethasone and IVIG with normalization of platelets.  Had rapid relapse suggesting some degree of refractoriness to initial treatment for her acute ITP. Plan -Continue prednisone 50 mg daily -We will discontinue Zoloft due to concerns with platelet dysfunction in the setting of significant thrombocytopenia. -Avoid any antiplatelet therapies. -Received first dose of IVIG yesterday.  To receive second dose of 1 g/kg of IVIG today. -Blood pressure mx and neurology co-management appreciated -Would transfuse 1 additional unit of platelets today to try to keep the platelets at more than 30k for now given labile ITP and in the setting of intraparenchymal bleed. -Repeat CT was noted to be stable without progression of her bleed. -Will likely need to start Rituxan weekly x4 and consider eventually adding venetoclax for management of her underlying CLL which would be a driver for her recurrent ITP that has not gone into remission with first-line induction steroid therapy. -Hematology will continue to follow closely.     LOS: 2 days   Mikey Bussing, DNP, AGPCNP-BC, AOCNP 09/17/19   ADDENDUM  .Patient was Personally and independently interviewed, examined and relevant elements of the history of present illness were reviewed in details and an assessment and plan was created. All elements of the patient's history of present illness , assessment and plan  were discussed in details with Mikey Bussing, DNP. The above documentation reflects our combined findings assessment and plan. Platelet counts this evening are up from 27k to 62k . Patient receiving second dose of IVIG without any overt side effects. Spent significant period of time discussing the need for additional treatment of her  CLL and acute ITP.  We discussed starting weekly Rituxan x4.  I went over the benefits, potential adverse effects and alternatives in detail with the patient.  She was given a handout regarding Rituxan.  We discussed that depending on her discharge plan by the primary team and nursing and pharmacy logistics will plan to do first dose of Rituxan as inpatient versus outpatient. Patient had nosebleeds gum bleeds and rectal bleeding on admission.  She notes that she has not had any further bleeding since hospitalization.  Drop in hemoglobin could be reflection of previous blood loss that is completed reflecting after volume replacement.  However will need continued monitoring of her hemoglobin and for source of bleeding and transfuse as needed to maintain hemoglobin more than 8. Transfuse platelets as needed to maintain platelets count more than 30k given labile ITP and intraparenchymal bleed. Hematology will continue to follow.  Sullivan Lone MD MS Total time spent 40 minutes more than 50% of time on direct patient contact counseling and coordination of care

## 2019-09-17 NOTE — Progress Notes (Signed)
STROKE TEAM PROGRESS NOTE   INTERVAL HISTORY I have personally reviewed history of presenting illness, electronic medical records and imaging films in PACS.  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  She received platelet transfusion and follow-up CT scan shows stable appearance of hemorrhage.  Blood pressure adequately controlled.  She has been treated with prednisone, IVIG and platelet transfusion for her ITP  Vitals:   09/17/19 1400 09/17/19 1415 09/17/19 1430 09/17/19 1445  BP: (!) 116/51 (!) 113/45 (!) 101/43 (!) 127/47  Pulse: 92 87 83 84  Resp: 18 19 19 19   Temp:      TempSrc:      SpO2: 95% 93% 95% 92%  Weight:      Height:        CBC:  Recent Labs  Lab 09/16/19 1700 09/17/19 0131  WBC 26.0* 18.5*  NEUTROABS 6.8 5.0  HGB 9.7* 8.8*  HCT 28.7* 26.6*  MCV 92.6 95.0  PLT 104* 27*    Basic Metabolic Panel:  Recent Labs  Lab 09/16/19 0133 09/17/19 0131  NA 138 131*  K 4.2 3.7  CL 108 104  CO2 22 21*  GLUCOSE 126* 118*  BUN 17 18  CREATININE 0.64 0.77  CALCIUM 9.0 7.9*  MG  --  1.8  PHOS  --  3.2   Lipid Panel: No results found for: CHOL, TRIG, HDL, CHOLHDL, VLDL, LDLCALC HgbA1c: No results found for: HGBA1C Urine Drug Screen: No results found for: LABOPIA, COCAINSCRNUR, LABBENZ, AMPHETMU, THCU, LABBARB  Alcohol Level No results found for: ETH  IMAGING past 48 hours CT HEAD WO CONTRAST  Result Date: 09/16/2019 CLINICAL DATA:  Follow-up intracranial hemorrhage. EXAM: CT HEAD WITHOUT CONTRAST TECHNIQUE: Contiguous axial images were obtained from the base of the skull through the vertex without intravenous contrast. COMPARISON:  Earlier same day.  08/09/2018. FINDINGS: Brain: No change since earlier today. Maximal 21 mm in diameter intraparenchymal hemorrhage in the lateral left temporal lobe with vasogenic edema and a small amount of adjacent subarachnoid  blood. Second 3.5 mm hyperdense focus present along the gyral surface of the right frontal parietal junction, axial image 17, also unchanged. Considerable concern for hemorrhagic metastases in this case. No evidence of midline shift. No sign of typical ischemic infarction. No hydrocephalus or extra-axial collection. Vascular: There is atherosclerotic calcification of the major vessels at the base of the brain. Skull: Negative Sinuses/Orbits: Clear/normal Other: None IMPRESSION: No change since earlier today. 21 mm hemorrhage or hemorrhagic lesion in the lateral left temporal lobe with surrounding vasogenic edema and a small amount of adjacent subarachnoid hemorrhage. Second 3.5 mm hyperdense focus affecting the gyral surface in the right frontoparietal junction. Presence of this second lesion elevates the likelihood of metastatic disease. This would be an unusual primary hemorrhage. Electronically Signed   By: Nelson Chimes M.D.   On: 09/16/2019 16:50   CT HEAD CODE STROKE WO CONTRAST  Result Date: 09/16/2019 CLINICAL DATA:  Code stroke. Neuro deficit, acute, stroke suspected. Additional history provided: Last known normal 9:50 AM, slurred speech and confusion EXAM: CT HEAD WITHOUT CONTRAST TECHNIQUE: Contiguous axial images were obtained from the base of the skull through the vertex without intravenous contrast. COMPARISON:  CT head 08/09/2018, brain MRI 01/14/2016 FINDINGS: Brain: There is an acute parenchymal hemorrhage centered within the left temporal lobe with mild surrounding edema. The hemorrhage measures 1.6 x 1.6 x 2.0 cm. Trace surrounding subarachnoid hemorrhage. Minimal  regional mass effect. No effacement of the ventricular system or midline shift. Additional 3 mm hyperdense focus within the cortex of the right parietal lobe (series 6, image 11) (series 3, image 17). No extra-axial fluid collection. Mild ill-defined hypoattenuation within the cerebral white matter is nonspecific, but consistent with  chronic small vessel ischemic disease. Mild generalized parenchymal atrophy. Vascular: No hyperdense vessel.  Atherosclerotic calcifications. Skull: Normal. Negative for fracture or focal lesion. Sinuses/Orbits: Visualized orbits demonstrate no acute abnormality. Other: No significant paranasal sinus disease or mastoid effusion at the imaged levels. These results were called by telephone at the time of interpretation on 09/16/2019 at 10:32 am to provider Dr. Rory Percy, who verbally acknowledged these results. IMPRESSION: 2 cm left temporal lobe parenchymal hemorrhage with mild surrounding edema. Trace surrounding subarachnoid hemorrhage. Contrast-enhanced brain MRI is recommended once the hematoma involutes to assess for an underlying lesion. Additional 3 mm hyperdense focus within the right parietal cortex. This may reflect an additional tiny parenchymal hemorrhage or other hyperdense lesion. This too should have MRI follow-up. Generalized parenchymal atrophy and chronic small vessel ischemic disease. Electronically Signed   By: Kellie Simmering DO   On: 09/16/2019 10:34    PHYSICAL EXAM Frail pleasant elderly Caucasian lady currently not in distress.  Skin shows multiple scattered petechiae throughout her body. . Afebrile. Head is nontraumatic. Neck is supple without bruit.    Cardiac exam no murmur or gallop. Lungs are clear to auscultation. Distal pulses are well felt. Neurological Examination: Awake alert oriented to time place and person.  Mild dysarthria and nonfluent speech and some word finding difficulty.  Able to follow simple midline and one-step commands only.  Slight difficulty with repetition and naming.  Comprehension seems better preserved.  Extraocular movements are full range without nystagmus.  Blinks to threat bilaterally.  Hearing is diminished bilaterally.  Tongue midline.  Motor system exam symmetric upper and lower extremity strength with no focal weakness but does have mild action tremor of the  right upper extremity.  Sensation is preserved bilaterally.  Gait not tested. ASSESSMENT/PLAN Selena Branch is a 79 y.o. female with history of HTN, HLD, CLL who presented to [facility] on 09/15/19 for acute ITP who developed altered mental status and slurred speech 1/3.    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 R parietal hyperdensity felt to be ICH. Small vessel disease. Atrophy.   Repeat CT head 1/3 stable  Repeat CT head 1/5 pending   SCDs for VTE prophylaxis  No antithrombotic prior to admission given ITP, now on No antithrombotic given hemorrhage. Avoid APLTS  Therapy recommendations:  pending   Disposition:  pending   Hypertension  Stable . SBP goal < 140 them < 160 at 24h . Long-term BP goal normotensive  Hyperlipidemia  Home meds:  liptior 28, resumed in hospital  Continue statin at discharge  Other Stroke Risk Factors  Advanced age  Family hx stroke (mother)  Other Active Problems  Acute ITP in setting of CLL. hematology on board. On prednisone. IVIG. Transfuse additional PLTS today.  Hyponatremia  Anemia  Hospital day # 2  I have personally obtained history,examined this patient, reviewed notes, independently viewed imaging studies, participated in medical decision making and plan of care.ROS completed by me personally and pertinent positives fully documented  I have made any additions or clarifications directly to the  above note.  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  Continue strict control of blood pressure is systolic goal below 0000000 and avoid antiplatelet agents.  Management of ITP as per hematology with steroids, IVIG and Rituxan We will repeat CT  scan of the head tomorrow morning.  Mobilize out of bed.  Therapy consults. Discussed with critical care team This patient is critically ill and at significant risk of neurological worsening, death and care requires constant monitoring of vital signs, hemodynamics,respiratory and cardiac monitoring, extensive review of multiple databases, frequent neurological assessment, discussion with family, other specialists and medical decision making of high complexity.I have made any additions or clarifications directly to the above note.This critical care time does not reflect procedure time, or teaching time or supervisory time of PA/NP/Med Resident etc but could involve care discussion time.  I spent 30 minutes of neurocritical care time  in the care of  this patient.     Antony Contras, MD Medical Director [facility] Stroke [facility] Pager: [phone] 09/17/2019 3:52 PM   To contact Stroke Continuity provider, please refer to [email or web address] After hours, contact General Neurology

## 2019-09-17 NOTE — Progress Notes (Signed)
CRITICAL VALUE ALERT  Critical Value:  Plt 27  Date & Time Notied:  09/16/18 0200  Provider Notified: Stretch  Orders Received/Actions taken: No orders as of now. Will assess at morning rounds

## 2019-09-17 NOTE — Progress Notes (Addendum)
PULMONARY / CRITICAL CARE MEDICINE   NAME:  ANJELIKA BEAS, MRN:  [id number], DOB:  March 21, 1941, LOS: 2 ADMISSION DATE:  09/15/2019, CONSULTATION DATE:  09/16/2019 REFERRING MD: Karmen Bongo, CHIEF COMPLAINT:  Acute ITP  BRIEF HISTORY:    Ms. Seville Wantz is a 79 y/o female with a PMH of HTN, CLL, HLD, and anxiety. PCCM was consulted to manage her acute ITP and L temproal lobe parenchymal hemorrhage.  HISTORY OF PRESENT ILLNESS   Ms. Farrel Sisemore is a 79 y/o female with a PMH of HTN, CLL, HLD, and anxiety, who presented originally due to ITP 2/2 to CLL. She was originally diagnosed with CLL on 11/2018, and has been following up with the cancer center for monitoring. Patient has had a previous admissions on 12/17 for CLL associated thrombocytopenia and was treated with Dexamethasone and IVIG. During this admission course it was found that she had diffuse petechia on her upper and lower extremities, with a platelet count of <5, and she was admitted. On 09/16/2019, a Rapid Response was called after patient exhibited aphasia. CT shows showed L  Temporal lobe parenchymal hemorrhage. Neurology and PCCM were consulted on 09/16/2019.  SIGNIFICANT PAST MEDICAL HISTORY   HTN CLL Anxiety Thrombocytosis  SIGNIFICANT EVENTS:  L temporal lobe parenchymal hemorrhage 09/16/2019 Thrombocytopenia 09/15/2019 STUDIES:   CT Head: 09/16/2019 2 cm L temporal Lobe parenchymal hemorrhage with surrounding edema. Trace surrounding subarachnoid hemorrhage.  Additional 3 mm hyperdense focus within R parietal cortex.  Follow up with MRI once stable.   CT Head 1/3:  No change since earlier today. 21 mm hemorrhage or hemorrhagic lesion in the lateral left temporal lobe with surrounding vasogenic edema and a small amount of adjacent subarachnoid hemorrhage. Second 3.5 mm hyperdense focus affecting the gyral surface in the right frontoparietal junction. Presence of this second lesion elevates the likelihood of metastatic disease. This  would be an unusual primary hemorrhage.   CULTURES:  SARS Coronavirus 2 negative MRSA surveillance negative  Influenza A/B negative    ANTIBIOTICS:  N/A  LINES/TUBES:  Peripheral IV LUE 09/15/2019 Peripheral IV R Hand 09/16/2019 External Catheter 09/16/2019 CONSULTANTS:  Neurology  Oncology  CONSTITUTIONAL: BP (!) 109/53   Pulse 100   Temp 99.9 F (37.7 C) (Oral)   Resp 18   Ht 4\' 10"  (1.473 m)   Wt 48.6 kg   SpO2 95%   BMI 22.39 kg/m   I/O last 3 completed shifts: In: 2134.2 [P.O.:320; I.V.:601.5; Blood:1212.7] Out: 1600 [Urine:1600]        PHYSICAL EXAM: General: Small frame, fragile appearing elderly, well-developed, well nourished female.  NAD HENT: Normocephalic, PERRL. Moist mucus membranes Neck: No JVD. Trachea midline. No thyromegaly, no lymphadenopathy CV: RRR. S1S2. No MRG. +2 distal pulses Lungs: BBS present, clear, FNL, symmetrical ABD: +BS x4. SNT/ND. No masses, guarding or rigidity GU: No Foley EXT: MAE well. No edema Skin: PWD. In tact. No rashes or lesions. Diffuse petechiae over lower EXT  Neuro: A&Ox3. CN II-XII in tact. No focal deficits Psych: Appropriate mood, insight and judgment for time and situation   Subjective/Events: Hungry, wants to eat. No other complaints. PLT 27K  Remains on high dose cleviprex.  On RA.   RESOLVED PROBLEM LIST   ASSESSMENT AND PLAN   Assesment:  Mella Angeletti is a 79 y/o female, PMH of HTN, CLL, ITP, HLD, and anxiety, who was admitted for acute ITP with new onset L temporal parenchymal hemorrhage.   Plan:   Acute ITP:  Patient admitted with  thrombocytopenia 2/2 to CLL.  - Oncology following, per same: - continue prednisone 50mg  QD  - D/c zoloft d/t concern for PLT dysfunction  - transfuse PLT today with goal for >/=30K per recs - IVIG - Continue trending CBC - Continue to monitor vitals. - Will likely need to start Rituxan weekly x4 and consider eventually adding venetoclax for management of her  underlying CLL which would be a driver for her recurrent ITP that has not gone into remission with first-line induction steroid therapy.    Temporal Parenchymal Hemorrhage:  Patient found with 2 m L temporal parenchymal hemorrhage with 3 mm hyperdense focus in the R parietal cortex.  - Neurology following  - BP Goal: SBP <160 per neuro  - Restart home norvasc at increased dose 10Mg  QD to facilitate wean of Cleviprex drip -on home accupril. Start lisinopril 10mg  per formulary ACEI-may need to up titrate  - Continue monitoring vitals - Frequent neuro checks - Continued management per oncology/hematology.  - Continue hydralazine Q4H PRN - Continue Telemetry   Hyponatremia-Notable drop from 1/2, [id number]  -Monitor   Anemia. Heme/Onc following  -monitor closely   SUMMARY OF TODAY'S PLAN:  Continue treating ITP with platelet transfusion with goal of >30,000 and treating per hematology/oncology, while monitoring vitals with a goal of SBP <140.   Best Practice / Goals of Care / Disposition.   DVT PROPHYLAXIS: ITP With platelet count <5000 SUP: N/A NUTRITION: Passed bedside swallow eval. ADAT MOBILITY: PT/OT GOALS OF CARE:Optimize patient in order to complete ADLs FAMILY DISCUSSIONS: will update  DISPOSITION: continue ICU   LABS  Glucose Recent Labs  Lab 09/16/19 1009  GLUCAP 110*    BMET Recent Labs  Lab 09/15/19 0904 09/16/19 0133 09/17/19 0131  NA 140 138 131*  K 3.8 4.2 3.7  CL 107 108 104  CO2 24 22 21*  BUN 18 17 18   CREATININE 0.81 0.64 0.77  GLUCOSE 138* 126* 118*    Liver Enzymes Recent Labs  Lab 09/11/19 1335 09/15/19 0904 09/17/19 0131  AST 29 35 31  ALT 25 22 15   ALKPHOS 64 53 43  BILITOT 0.7 0.8 0.6  ALBUMIN 3.5 3.5 2.8*    Electrolytes Recent Labs  Lab 09/15/19 0904 09/16/19 0133 09/17/19 0131  CALCIUM 9.1 9.0 7.9*  MG  --   --  1.8  PHOS  --   --  3.2    CBC Recent Labs  Lab 09/16/19 1141 09/16/19 1700 09/17/19 0131  WBC  24.8* 26.0* 18.5*  HGB 10.2* 9.7* 8.8*  HCT 30.5* 28.7* 26.6*  PLT <5* 104* 27*    ABG No results for input(s): PHART, PCO2ART, PO2ART in the last 168 hours.  Coag's Recent Labs  Lab 09/15/19 1008 09/16/19 1141  APTT 24 23*  INR 1.0 1.0    Sepsis Markers Recent Labs  Lab 09/17/19 0131  LATICACIDVEN 0.8     The patient is critically ill with hemorrhagic stroke and ITP. She requires ICU for high complexity decision making, titration of high alert medications,  and interpretation of advanced monitoring.    I personally spent 35 minutes providing critical care services including personally reviewing test results, discussing care with nursing staff/other physicians and completing orders pertaining to this patient.  Time was exclusive to the patient and does not include time spent teaching or in procedures.  Voice recognition software was used in the production of this record.  Errors in interpretation may have been inadvertently missed during review.  Francine Graven, MSN,  AGACNP  [facility]  Attending Note:  79 year old female with CLL presenting with acute ITP and a small left temporal bleed that was transferred to the ICU for BP control via cleveprix.  Patient had no further events overnight.  On exam, she is alert and interactive, moving all ext to command with clear lungs.  I reviewed CXR myself, no acute disease noted.  H/O and neurology following for above.  No need for intubation at this time.  Will continue active BP control.  Restart BP home medications.  Will re-examine in the afternoon, if off cleveprix drip will consider transfer out of the ICU and to [facility] service with PCCM off 1/5.  The patient is critically ill with multiple organ systems failure and requires high complexity decision making for assessment and support, frequent evaluation and titration of therapies, application of advanced monitoring technologies and extensive interpretation of  multiple databases.   Critical Care Time devoted to patient care services described in this note is  31  Minutes. This time reflects time of care of this signee Dr Jennet Maduro. This critical care time does not reflect procedure time, or teaching time or supervisory time of PA/NP/Med student/Med Resident etc but could involve care discussion time.  Rush Farmer, M.D. [facility] Pulmonary/Critical Care Medicine.

## 2019-09-17 NOTE — Progress Notes (Signed)
Hematology Short note  . CBC Latest Ref Rng & Units 09/17/2019 09/16/2019 09/16/2019  WBC 4.0 - 10.5 K/uL 18.5(H) 26.0(H) 24.8(H)  Hemoglobin 12.0 - 15.0 g/dL 8.8(L) 9.7(L) 10.2(L)  Hematocrit 36.0 - 46.0 % 26.6(L) 28.7(L) 30.5(L)  Platelets 150 - 400 K/uL 27(LL) 104(L) <5(LL)   1) acute ITP in the setting of CLL.   Patient had excellent response with previous induction dexamethasone and IVIG with normalization of platelets.  Had rapid relapse suggesting some degree of refractoriness to initial treatment for her acute ITP. Plan -Continue prednisone 50 mg daily -We will discontinue Zoloft due to concerns with platelet dysfunction in the setting of significant thrombocytopenia. -Avoid any antiplatelet therapies. -Received first dose of IVIG yesterday.  To receive second dose of 1 g/kg of IVIG today. -Blood pressure mx and neurology co-management appreciated -Would transfuse 1 additional unit of platelets today to try to keep the platelets at more than 30k for now given labile ITP and in the setting of intraparenchymal bleed. -Repeat CT was noted to be stable without progression of her bleed. -Will likely need to start Rituxan weekly x4 and consider eventually adding venetoclax for management of her underlying CLL which would be a driver for her recurrent ITP that has not gone into remission with first-line induction steroid therapy. -Hematology will continue to follow closely. -Please call me if any acute hematologic questions at [phone].  Sullivan Lone MD MS Hematology/Oncology Physician [facility]

## 2019-09-18 ENCOUNTER — Other Ambulatory Visit: Payer: Self-pay | Admitting: Hematology

## 2019-09-18 ENCOUNTER — Inpatient Hospital Stay (HOSPITAL_COMMUNITY): Payer: PPO

## 2019-09-18 DIAGNOSIS — E785 Hyperlipidemia, unspecified: Secondary | ICD-10-CM

## 2019-09-18 LAB — CBC WITH DIFFERENTIAL/PLATELET
Abs Immature Granulocytes: 0.06 K/uL (ref 0.00–0.07)
Basophils Absolute: 0 K/uL (ref 0.0–0.1)
Basophils Relative: 0 %
Eosinophils Absolute: 0 K/uL (ref 0.0–0.5)
Eosinophils Relative: 0 %
HCT: 22.3 % — ABNORMAL LOW (ref 36.0–46.0)
Hemoglobin: 7.3 g/dL — ABNORMAL LOW (ref 12.0–15.0)
Immature Granulocytes: 0 %
Lymphocytes Relative: 64 %
Lymphs Abs: 10 K/uL — ABNORMAL HIGH (ref 0.7–4.0)
MCH: 30.9 pg (ref 26.0–34.0)
MCHC: 32.7 g/dL (ref 30.0–36.0)
MCV: 94.5 fL (ref 80.0–100.0)
Monocytes Absolute: 1.6 K/uL — ABNORMAL HIGH (ref 0.1–1.0)
Monocytes Relative: 10 %
Neutro Abs: 4.2 K/uL (ref 1.7–7.7)
Neutrophils Relative %: 26 %
Platelets: 47 K/uL — ABNORMAL LOW (ref 150–400)
RBC: 2.36 MIL/uL — ABNORMAL LOW (ref 3.87–5.11)
RDW: 14.4 % (ref 11.5–15.5)
WBC: 15.9 K/uL — ABNORMAL HIGH (ref 4.0–10.5)
nRBC: 0 % (ref 0.0–0.2)

## 2019-09-18 LAB — URIC ACID: Uric Acid, Serum: 4.6 mg/dL (ref 2.5–7.1)

## 2019-09-18 LAB — HEPATITIS C ANTIBODY: HCV Ab: REACTIVE — AB

## 2019-09-18 LAB — PREPARE PLATELET PHERESIS: Unit division: 0

## 2019-09-18 LAB — RETICULOCYTES
Immature Retic Fract: 20 % — ABNORMAL HIGH (ref 2.3–15.9)
RBC.: 2.67 MIL/uL — ABNORMAL LOW (ref 3.87–5.11)
Retic Count, Absolute: 85.7 K/uL (ref 19.0–186.0)
Retic Ct Pct: 3.2 % — ABNORMAL HIGH (ref 0.4–3.1)

## 2019-09-18 LAB — PREPARE RBC (CROSSMATCH)

## 2019-09-18 LAB — HEPATITIS B SURFACE ANTIGEN: Hepatitis B Surface Ag: NONREACTIVE

## 2019-09-18 LAB — PHOSPHORUS: Phosphorus: 3 mg/dL (ref 2.5–4.6)

## 2019-09-18 LAB — DIRECT ANTIGLOBULIN TEST (NOT AT ARMC)
DAT, IgG: POSITIVE
DAT, complement: POSITIVE

## 2019-09-18 LAB — MAGNESIUM: Magnesium: 1.9 mg/dL (ref 1.7–2.4)

## 2019-09-18 LAB — BASIC METABOLIC PANEL WITH GFR
Anion gap: 7 (ref 5–15)
BUN: 22 mg/dL (ref 8–23)
CO2: 19 mmol/L — ABNORMAL LOW (ref 22–32)
Calcium: 7.9 mg/dL — ABNORMAL LOW (ref 8.9–10.3)
Chloride: 107 mmol/L (ref 98–111)
Creatinine, Ser: 0.7 mg/dL (ref 0.44–1.00)
GFR calc Af Amer: >60 mL/min
GFR calc non Af Amer: >60 mL/min
Glucose, Bld: 109 mg/dL — ABNORMAL HIGH (ref 70–99)
Potassium: 3.1 mmol/L — ABNORMAL LOW (ref 3.5–5.1)
Sodium: 133 mmol/L — ABNORMAL LOW (ref 135–145)

## 2019-09-18 LAB — BPAM PLATELET PHERESIS
Blood Product Expiration Date: 202101052359
ISSUE DATE / TIME: 202101041205
Unit Type and Rh: 600

## 2019-09-18 LAB — HEPATITIS B CORE ANTIBODY, TOTAL: Hep B Core Total Ab: REACTIVE — AB

## 2019-09-18 LAB — LACTATE DEHYDROGENASE: LDH: 227 U/L — ABNORMAL HIGH (ref 98–192)

## 2019-09-18 LAB — PATHOLOGIST SMEAR REVIEW

## 2019-09-18 MED ORDER — HEPARIN SOD (PORK) LOCK FLUSH 100 UNIT/ML IV SOLN
500.0000 [IU] | Freq: Every day | INTRAVENOUS | Status: DC | PRN
  Filled 2019-09-18: qty 5

## 2019-09-18 MED ORDER — ACETAMINOPHEN 325 MG PO TABS
650.0000 mg | ORAL_TABLET | Freq: Once | ORAL | Status: AC
  Administered 2019-09-18: 650 mg via ORAL
  Filled 2019-09-18: qty 2

## 2019-09-18 MED ORDER — SODIUM CHLORIDE 0.9% IV SOLUTION
250.0000 mL | Freq: Once | INTRAVENOUS | Status: AC
  Administered 2019-09-18: 250 mL via INTRAVENOUS

## 2019-09-18 MED ORDER — SODIUM CHLORIDE 0.9% FLUSH: 3.0000 mL | INTRAVENOUS | Status: DC | PRN

## 2019-09-18 MED ORDER — POTASSIUM CHLORIDE CRYS ER 20 MEQ PO TBCR
40.0000 meq | EXTENDED_RELEASE_TABLET | Freq: Once | ORAL | Status: AC
  Administered 2019-09-18: 40 meq via ORAL
  Filled 2019-09-18: qty 2

## 2019-09-18 MED ORDER — HEPARIN SOD (PORK) LOCK FLUSH 100 UNIT/ML IV SOLN
250.0000 [IU] | INTRAVENOUS | Status: DC | PRN
  Filled 2019-09-18: qty 2.5

## 2019-09-18 MED ORDER — METHYLPREDNISOLONE SODIUM SUCC 40 MG IJ SOLR
40.0000 mg | Freq: Once | INTRAMUSCULAR | Status: AC
  Administered 2019-09-18: 40 mg via INTRAVENOUS
  Filled 2019-09-18: qty 1

## 2019-09-18 MED ORDER — SODIUM CHLORIDE 0.9% FLUSH: 10.0000 mL | INTRAVENOUS | Status: DC | PRN

## 2019-09-18 NOTE — Progress Notes (Signed)
Hematology Short Note  Labs reviewed. PLT 47k, hgb down to 7.3 likely reflecting previous blood loss prior to admission. No overt bleeding since hospitalization. CT head - stable. Discussed with Dr Ree Kida . Plan to proceed with 1st dose of Rituxan tomorrow if logistically possible from nursing and pharmacy standpoint. Order set for Rituxan placed and inpatient chemotherapy email sent. Would recommend 1 unit of PRBC for dropping hgb @ 7.3 today. Hep B sAg neg Hep B cAB +ve -- likely from IVIG Hep C ab +ve - likely fromIVIG -will check hep b and hep c VL - though less likely to be positive  CBC    Component Value Date/Time   WBC 15.9 (H) 09/18/2019 0245   RBC 2.67 (L) 09/18/2019 0856   RBC 2.36 (L) 09/18/2019 0245   HGB 7.3 (L) 09/18/2019 0245   HGB 11.3 (L) 09/11/2019 1335   HCT 22.3 (L) 09/18/2019 0245   PLT 47 (L) 09/18/2019 0245   PLT <5 (LL) 09/11/2019 1335   MCV 94.5 09/18/2019 0245   MCH 30.9 09/18/2019 0245   MCHC 32.7 09/18/2019 0245   RDW 14.4 09/18/2019 0245   LYMPHSABS 10.0 (H) 09/18/2019 0245   MONOABS 1.6 (H) 09/18/2019 0245   EOSABS 0.0 09/18/2019 0245   BASOSABS 0.0 09/18/2019 0245   Shall continue to follow   Selena Branch

## 2019-09-18 NOTE — Evaluation (Signed)
Physical Therapy Evaluation and Discharge Patient Details Name: Selena Branch MRN: [id number] DOB: February 24, 1941 Today's Date: 09/18/2019   History of Present Illness  79 y.o. 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  She received platelet transfusion and follow-up CT scan shows stable appearance of hemorrhage.   Clinical Impression   Patient evaluated by Physical Therapy with no further acute PT needs identified. All education has been completed and the patient has no further questions. Patient walked 200 ft with supervision for safety only (due to low platelets). She is able to stand 15 sec in [place] with eyes closed and no incr sway. She is typically very active, including using her treadmill twice per day (2 miles total) and caring for her husband with dementia. PT is signing off. Thank you for this referral.       Follow Up Recommendations No PT follow up    Equipment Recommendations  None recommended by PT    Recommendations for Other Services       Precautions / Restrictions Precautions Precautions: Other (comment) Precaution Comments: low platelets and Hgb; denied dizziness Restrictions Weight Bearing Restrictions: No      Mobility  Bed Mobility Overal bed mobility: Independent                Transfers Overall transfer level: Independent Equipment used: None                Ambulation/Gait Ambulation/Gait assistance: Min Gaffer (Feet): 200 Feet Assistive device: None Gait Pattern/deviations: WFL(Within Functional Limits)   Gait velocity interpretation: >2.62 ft/sec, indicative of community ambulatory General Gait Details: patient reported she normally walks faster than we could walk in the ICU  Stairs            Wheelchair Mobility    Modified Rankin (Stroke Patients  Only)       Balance Overall balance assessment: Independent                         [place] - Eyes Closed: 15(then opens her eyes; no imbalance)                 Pertinent Vitals/Pain Pain Assessment: No/denies pain    Home Living Family/patient expects to be discharged to:: Private residence Living Arrangements: Spouse/significant other Available Help at Discharge: Family;Available PRN/intermittently Type of Home: House Home Access: Stairs to enter Entrance Stairs-Rails: Psychiatric nurse of Steps: 8 Home Layout: One level Home Equipment: Grab bars - tub/shower;Grab bars - toilet(husband has equipment from hip surgery; she is too petite )      Prior Function Level of Independence: Independent         Comments: she sits down in the tub to bathe     Hand Dominance   Dominant Hand: Right    Extremity/Trunk Assessment   Upper Extremity Assessment Upper Extremity Assessment: Defer to OT evaluation    Lower Extremity Assessment Lower Extremity Assessment: Overall WFL for tasks assessed    Cervical / Trunk Assessment Cervical / Trunk Assessment: Normal  Communication   Communication: No difficulties  Cognition Arousal/Alertness: Awake/alert Behavior During Therapy: WFL for tasks assessed/performed Overall Cognitive Status: Impaired/Different from baseline Area of Impairment: Attention;Problem solving                   Current Attention Level:  Alternating;Divided         Problem Solving: Difficulty sequencing        General Comments      Exercises     Assessment/Plan    PT Assessment Patent does not need any further PT services  PT Problem List         PT Treatment Interventions      PT Goals (Current goals can be found in the Care Plan section)  Acute Rehab PT Goals Patient Stated Goal: To take care of spouse  PT Goal Formulation: All assessment and education complete, DC therapy    Frequency      Barriers to discharge        Co-evaluation               AM-PAC PT "6 Clicks" Mobility  Outcome Measure Help needed turning from your back to your side while in a flat bed without using bedrails?: None Help needed moving from lying on your back to sitting on the side of a flat bed without using bedrails?: None Help needed moving to and from a bed to a chair (including a wheelchair)?: None Help needed standing up from a chair using your arms (e.g., wheelchair or bedside chair)?: None Help needed to walk in hospital room?: None Help needed climbing 3-5 steps with a railing? : None 6 Click Score: 24    End of Session Equipment Utilized During Treatment: Gait belt Activity Tolerance: Patient tolerated treatment well Patient left: in chair;with call bell/phone within reach;Other (comment)(with OT present (arrived at end of session) Nurse Communication: Mobility status PT Visit Diagnosis: Other symptoms and signs involving the nervous system (R29.898)    Time: 0930-1005 PT Time Calculation (min) (ACUTE ONLY): 35 min   Charges:   PT Evaluation $PT Eval Moderate Complexity: 1 Mod PT Treatments $Therapeutic Activity: 8-22 mins         Arby Barrette, PT Pager [phone]   Rexanne Mano 09/18/2019, 2:41 PM

## 2019-09-18 NOTE — Progress Notes (Addendum)
PROGRESS NOTE    Selena Branch  [id number] DOB: 26-May-1941 DOA: 09/15/2019 PCP: [name], MD   Brief Narrative:  HPI on 09/15/2019 by Dr. Karmen Bongo Selena Branch is a 79 y.o. female with medical history significant of HTN; HLD; CLL; and glaucoma presenting with bleeding of gums and nose, petechiae.  She was diagnosed with CLL in March and was going into to the [facility] for ongoing monitoring every 3 months.  She was due to go Monday to develop a treatment plan and things went crazy on Monday.  Patient was last admitted from 12/17-19 with thrombocytopenia associated with CLL; oncology was consulted and she was treated with Dexamethasone and IVIG.  Platelets were 63 at the time of d/c and she was recommended to have a BM biopsy the following week.  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  Clearly, her blood did not like the platelets and she had to come back.  She was given platelets Tuesday noticed bleeding from the gums and nose Thursday-Friday and then developed diffuse petechiae.  Things were worse this AM and she "realized I really had an issue."  Her prior hospitalization kept her without symptoms through 12/29.  No falls.  No headaches.  Interim history Patient admitted with intracranial bleed and ITP. Patient was transferred to ICU. Neurology and hem/onc consulted. Patient was started on IVIG and steroids.   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 Irene Limbo, hem/onc, patient has received 2 doses of IVIG, high-dose.  He plans to start Rituxan in the hospital possibly tomorrow, 09/19/2019. Goal for platelets >30K. Zoloft  discontinued. -Continue to monitor CBC  Left temporal lobe and right parietal lobe ICH -In the setting of ITP -CT head on 09/15/2018: 77mm hemorrhage or hemorrhagic lesion of the lateral left temporal lobe with surrounding vasogenic edema and small amount of adjacent subarachnoid hemorrhage.  Second 3.5 mm hypodense focus affecting the gyral surface on the right frontoparietal junction.  Presence of the second lesion elevates the likelihood of metastatic disease. -Neurology consulted and appreciated -Patient was placed on Cleviprex drip -repeat CT head today: Size stable sites of cerebral hemorrhage.  No new abnormality or significant mass-effect. -Discussed with Dr. Leonie Man, patient is stable from a neurology standpoint -PT/OT consulted  Hyponateremia -Sodium was 131, is up to 133 today -continue to monitor BMP   Chronic normocytic anemia -hemoglobin currently 7.3, was 11 on admission -Suspect drop was dilutional component along with the above -Continue to monitor CBC  Hypokalemia -Potassium 3.1, will replace and continue monitor BMP -Will obtain magnesium level  Leukocytosis -Suspect reactive as well as steroid use -continue to monitor CBC   Hyperlipidemia -continue statin  Essential hypertension -Continue amlodipine, lisinopril  DVT Prophylaxis  SCDs  Code Status: DNR  Family Communication: None at bedside  Disposition Plan: Admitted.  Pending PT and OT.  Pending further recommendations from hematology/oncology.  Disposition TBD  Consultants Neurology Hematology/oncology PCCM  Procedures  None  Antibiotics   Anti-infectives (From admission, onward)   None      Subjective:   Selena Branch seen and examined today.  Patient with no complaints this morning.  Denies any dizziness or headache.  States she is feeling better today.  Denies current chest pain, shortness of breath, nausea or vomiting, diarrhea or constipation.  Objective:   Vitals:   09/18/19 0800  09/18/19 0900 09/18/19 1000 09/18/19 1100  BP:   (!) 190/63 (!) 150/67  Pulse: 81 87 93 81  Resp: (!) 21 17 (!) 26 (!) 22  Temp: 97.8 F (36.6 C)     TempSrc: Oral     SpO2: 100% 100% 100% 93%  Weight:      Height:        Intake/Output Summary (Last 24 hours) at 09/18/2019 1150 Last data filed at 09/18/2019 0000 Gross per 24 hour  Intake 3115.03 ml  Output 950 ml  Net 2165.03 ml   Filed Weights   09/15/19 1609 09/16/19 1300  Weight: 47.7 kg 48.6 kg    Exam  General: Well developed, well nourished, NAD, appears stated age  65: NCAT, mucous membranes moist.   Cardiovascular: S1 S2 auscultated, RRR  Respiratory: Clear to auscultation bilaterally   Abdomen: Soft, nontender, nondistended, + bowel sounds  Extremities: warm dry without cyanosis clubbing or edema  Neuro: AAOx3, nonfocal  Psych: Normal affect and demeanor, pleasant   Data Reviewed: I have personally reviewed following labs and imaging studies  CBC: Recent Labs  Lab 09/16/19 1141 09/16/19 1700 09/17/19 0131 09/17/19 1549 09/18/19 0245  WBC 24.8* 26.0* 18.5* 18.6* 15.9*  NEUTROABS 7.2 6.8 5.0 11.3* 4.2  HGB 10.2* 9.7* 8.8* 8.5* 7.3*  HCT 30.5* 28.7* 26.6* 25.5* 22.3*  MCV 93.0 92.6 95.0 93.1 94.5  PLT <5* 104* 27* 62* 47*   Basic Metabolic Panel: Recent Labs  Lab 09/11/19 1335 09/15/19 0904 09/16/19 0133 09/17/19 0131 09/18/19 0245  NA 141 140 138 131* 133*  K 4.3 3.8 4.2 3.7 3.1*  CL 109 107 108 104 107  CO2 25 24 22  21* 19*  GLUCOSE 106* 138* 126* 118* 109*  BUN 24* 18 17 18 22   CREATININE 0.63 0.81 0.64 0.77 0.70  CALCIUM 9.0 9.1 9.0 7.9* 7.9*  MG  --   --   --  1.8 1.9  PHOS  --   --   --  3.2 3.0   GFR: Estimated Creatinine Clearance: 36.8 mL/min (by C-G formula based on SCr of 0.7 mg/dL). Liver Function Tests: Recent Labs  Lab 09/11/19 1335 09/15/19 0904 09/17/19 0131  AST 29 35 31  ALT 25 22 15   ALKPHOS 64 53 43  BILITOT 0.7 0.8 0.6  PROT 7.3 6.7 7.9  ALBUMIN 3.5  3.5 2.8*   No results for input(s): LIPASE, AMYLASE in the last 168 hours. No results for input(s): AMMONIA in the last 168 hours. Coagulation Profile: Recent Labs  Lab 09/15/19 1008 09/16/19 1141  INR 1.0 1.0   Cardiac Enzymes: No results for input(s): CKTOTAL, CKMB, CKMBINDEX, TROPONINI in the last 168 hours. BNP (last 3 results) No results for input(s): PROBNP in the last 8760 hours. HbA1C: No results for input(s): HGBA1C in the last 72 hours. CBG: Recent Labs  Lab 09/16/19 1009  GLUCAP 110*   Lipid Profile: No results for input(s): CHOL, HDL, LDLCALC, TRIG, CHOLHDL, LDLDIRECT in the last 72 hours. Thyroid Function Tests: No results for input(s): TSH, T4TOTAL, FREET4, T3FREE, THYROIDAB in the last 72 hours. Anemia Panel: Recent Labs    09/18/19 0856  RETICCTPCT 3.2*   Urine analysis:    Component Value Date/Time   COLORURINE YELLOW 05/06/2010 1611   APPEARANCEUR CLEAR 05/06/2010 1611   LABSPEC 1.026 05/06/2010  1611   PHURINE 5.0 05/06/2010 1611   GLUCOSEU NEGATIVE 05/06/2010 1611   HGBUR NEGATIVE 05/06/2010 1611   BILIRUBINUR SMALL (A) 05/06/2010 1611   KETONESUR 15 (A) 05/06/2010 1611   PROTEINUR NEGATIVE 05/06/2010 1611   UROBILINOGEN 0.2 05/06/2010 1611   NITRITE NEGATIVE 05/06/2010 1611   LEUKOCYTESUR  05/06/2010 1611    NEGATIVE MICROSCOPIC NOT DONE ON URINES WITH NEGATIVE PROTEIN, BLOOD, LEUKOCYTES, NITRITE, OR GLUCOSE <1000 mg/dL.   Sepsis Labs: @LABRCNTIP (procalcitonin:4,lacticidven:4)  ) Recent Results (from the past 240 hour(s))  Respiratory Panel by RT PCR (Flu A&B, Covid) - Nasopharyngeal Swab     Status: None   Collection Time: 09/15/19  3:40 PM   Specimen: Nasopharyngeal Swab  Result Value Ref Range Status   SARS Coronavirus 2 by RT PCR NEGATIVE NEGATIVE Final    Comment: (NOTE) SARS-CoV-2 target nucleic acids are NOT DETECTED. The SARS-CoV-2 RNA is generally detectable in upper respiratoy specimens during the acute phase of infection.  The lowest concentration of SARS-CoV-2 viral copies this assay can detect is 131 copies/mL. A negative result does not preclude SARS-Cov-2 infection and should not be used as the sole basis for treatment or other patient management decisions. A negative result may occur with  improper specimen collection/handling, submission of specimen other than nasopharyngeal swab, presence of viral mutation(s) within the areas targeted by this assay, and inadequate number of viral copies (<131 copies/mL). A negative result must be combined with clinical observations, patient history, and epidemiological information. The expected result is Negative. Fact Sheet for Patients:  PinkCheek.be Fact Sheet for Healthcare Providers:  GravelBags.it This test is not yet ap proved or cleared by the [place] FDA and  has been authorized for detection and/or diagnosis of SARS-CoV-2 by FDA under an Emergency Use Authorization (EUA). This EUA will remain  in effect (meaning this test can be used) for the duration of the COVID-19 declaration under Section 564(b)(1) of the Act, 21 U.S.C. section 360bbb-3(b)(1), unless the authorization is terminated or revoked sooner.    Influenza A by PCR NEGATIVE NEGATIVE Final   Influenza B by PCR NEGATIVE NEGATIVE Final    Comment: (NOTE) The Xpert Xpress SARS-CoV-2/FLU/RSV assay is intended as an aid in  the diagnosis of influenza from Nasopharyngeal swab specimens and  should not be used as a sole basis for treatment. Nasal washings and  aspirates are unacceptable for Xpert Xpress SARS-CoV-2/FLU/RSV  testing. Fact Sheet for Patients: PinkCheek.be Fact Sheet for Healthcare Providers: GravelBags.it This test is not yet approved or cleared by the [place] FDA and  has been authorized for detection and/or diagnosis of SARS-CoV-2 by  FDA under an  Emergency Use Authorization (EUA). This EUA will remain  in effect (meaning this test can be used) for the duration of the  Covid-19 declaration under Section 564(b)(1) of the Act, 21  U.S.C. section 360bbb-3(b)(1), unless the authorization is  terminated or revoked. Performed at [facility], [place] [address] [place], [place] [address]   MRSA PCR Screening     Status: None   Collection Time: 09/16/19  1:14 PM   Specimen: Nasal Mucosa; Nasopharyngeal  Result Value Ref Range Status   MRSA by PCR NEGATIVE NEGATIVE Final    Comment:        The GeneXpert MRSA Assay (FDA approved for NASAL specimens only), is one component of a comprehensive MRSA colonization surveillance program. It is not intended to diagnose MRSA infection nor to guide or monitor treatment for MRSA infections. Performed at  [facility], [place] [address], [place], [place] [address]       Radiology Studies: CT HEAD WO CONTRAST  Result Date: 09/18/2019 CLINICAL DATA:  Stroke follow-up. EXAM: CT HEAD WITHOUT CONTRAST TECHNIQUE: Contiguous axial images were obtained from the base of the skull through the vertex without intravenous contrast. COMPARISON:  Two days ago FINDINGS: Brain: 20 mm hematoma in the superior and anterior left temporal subcortical lobe with rim of edema. 3 mm hyperdensity along the right posterior frontal cortex. No interval change. No visible infarct, hydrocephalus, or midline shift. Vascular: No hyperdense vessel or unexpected calcification. Skull: Normal. Negative for fracture or focal lesion. Sinuses/Orbits: Unremarkable IMPRESSION: Size stable sites of cerebral hemorrhage. No new abnormality or significant mass effect. Electronically Signed   By: Monte Fantasia M.D.   On: 09/18/2019 04:35   CT HEAD WO CONTRAST  Result Date: 09/16/2019 CLINICAL DATA:  Follow-up intracranial hemorrhage. EXAM: CT HEAD WITHOUT CONTRAST TECHNIQUE: Contiguous axial images were obtained from the base of the  skull through the vertex without intravenous contrast. COMPARISON:  Earlier same day.  08/09/2018. FINDINGS: Brain: No change since earlier today. Maximal 21 mm in diameter intraparenchymal hemorrhage in the lateral left temporal lobe with vasogenic edema and a small amount of adjacent subarachnoid blood. Second 3.5 mm hyperdense focus present along the gyral surface of the right frontal parietal junction, axial image 17, also unchanged. Considerable concern for hemorrhagic metastases in this case. No evidence of midline shift. No sign of typical ischemic infarction. No hydrocephalus or extra-axial collection. Vascular: There is atherosclerotic calcification of the major vessels at the base of the brain. Skull: Negative Sinuses/Orbits: Clear/normal Other: None IMPRESSION: No change since earlier today. 21 mm hemorrhage or hemorrhagic lesion in the lateral left temporal lobe with surrounding vasogenic edema and a small amount of adjacent subarachnoid hemorrhage. Second 3.5 mm hyperdense focus affecting the gyral surface in the right frontoparietal junction. Presence of this second lesion elevates the likelihood of metastatic disease. This would be an unusual primary hemorrhage. Electronically Signed   By: Nelson Chimes M.D.   On: 09/16/2019 16:50     Scheduled Meds: . amLODipine  10 mg Oral QHS  . atorvastatin  40 mg Oral Daily  . Chlorhexidine Gluconate Cloth  6 each Topical Daily  . dorzolamide-timolol  1 drop Both Eyes Daily  . latanoprost  1 drop Both Eyes QHS  . lisinopril  10 mg Oral Daily  . predniSONE  50 mg Oral Q breakfast   Continuous Infusions:   LOS: 3 days   Time Spent in minutes   45 minutes  Fronia Depass D.O. on 09/18/2019 at 11:50 AM  Between 7am to 7pm - Please see pager noted on [email or web address]  After 7pm go to [email or web address]  And look for the night coverage person covering for me after hours  [facility] Office  [phone]

## 2019-09-18 NOTE — Progress Notes (Signed)
START OFF PATHWAY REGIMEN - Lymphoma and CLL   [id number]:Rituximab (IV/Subcut) D1 Weekly:   A cycle is every 7 days:     Rituximab-xxxx      Rituximab and hyaluronidase human   **Always confirm dose/schedule in your pharmacy ordering system**  Patient Characteristics: Chronic Lymphocytic Leukemia (CLL), First Line, Treatment Indicated, 17p del(-)/Unknown and ATM Mutation Negative/Unknown and TP53 Mutation Negative/Unknown, Age ? 60 or Frail (Any Age) Disease Type: Chronic Lymphocytic Leukemia (CLL) Disease Type: Not Applicable Disease Type: Not Applicable Line of Therapy: First Line RAI Stage: IV Treatment Indicated<= Treatment Indicated ATM Mutation Status: Negative 17p Deletion Status: Negative TP53 Mutation Status: Negative Patient Age: ? 60 Patient Condition: Frail Patient Intent of Therapy: Non-Curative / Palliative Intent, Discussed with Patient 

## 2019-09-18 NOTE — Progress Notes (Signed)
STROKE TEAM PROGRESS NOTE   INTERVAL HISTORY Patient is resting comfortably in bed.  Medical hospitalist Dr. Ree Branch at the bedside.  Patient states his speech is improved.  Follow-up CT scan of the head this morning shows stable appearance of both hematomas in the brain.  Blood pressure adequately controlled.  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  Hematologist plans to start Rituxan today.  Platelet counts are improving  Vitals:   09/18/19 1200 09/18/19 1300 09/18/19 1400 09/18/19 1431  BP: (!) 159/59   (!) 152/63  Pulse: 79 84 83 82  Resp: (!) 24 (!) 9 (!) 22   Temp: 98.1 F (36.7 C)   98 F (36.7 C)  TempSrc: Oral   Oral  SpO2: 97% 99% 100% 100%  Weight:      Height:        CBC:  Recent Labs  Lab 09/17/19 1549 09/18/19 0245  WBC 18.6* 15.9*  NEUTROABS 11.3* 4.2  HGB 8.5* 7.3*  HCT 25.5* 22.3*  MCV 93.1 94.5  PLT 62* 47*    Basic Metabolic Panel:  Recent Labs  Lab 09/17/19 0131 09/18/19 0245  NA 131* 133*  K 3.7 3.1*  CL 104 107  CO2 21* 19*  GLUCOSE 118* 109*  BUN 18 22  CREATININE 0.77 0.70  CALCIUM 7.9* 7.9*  MG 1.8 1.9  PHOS 3.2 3.0   Lipid Panel: No results found for: CHOL, TRIG, HDL, CHOLHDL, VLDL, LDLCALC HgbA1c: No results found for: HGBA1C Urine Drug Screen: No results found for: LABOPIA, COCAINSCRNUR, LABBENZ, AMPHETMU, THCU, LABBARB  Alcohol Level No results found for: ETH  IMAGING past 48 hours CT HEAD WO CONTRAST  Result Date: 09/18/2019 CLINICAL DATA:  Stroke follow-up. EXAM: CT HEAD WITHOUT CONTRAST TECHNIQUE: Contiguous axial images were obtained from the base of the skull through the vertex without intravenous contrast. COMPARISON:  Two days ago FINDINGS: Brain: 20 mm hematoma in the superior and anterior left temporal subcortical lobe with rim of edema. 3 mm hyperdensity along the right posterior frontal cortex. No interval change. No visible infarct, hydrocephalus, or midline shift. Vascular: No  hyperdense vessel or unexpected calcification. Skull: Normal. Negative for fracture or focal lesion. Sinuses/Orbits: Unremarkable IMPRESSION: Size stable sites of cerebral hemorrhage. No new abnormality or significant mass effect. Electronically Signed   By: Selena Branch M.D.   On: 09/18/2019 04:35   CT HEAD WO CONTRAST  Result Date: 09/16/2019 CLINICAL DATA:  Follow-up intracranial hemorrhage. EXAM: CT HEAD WITHOUT CONTRAST TECHNIQUE: Contiguous axial images were obtained from the base of the skull through the vertex without intravenous contrast. COMPARISON:  Earlier same day.  08/09/2018. FINDINGS: Brain: No change since earlier today. Maximal 21 mm in diameter intraparenchymal hemorrhage in the lateral left temporal lobe with vasogenic edema and a small amount of adjacent subarachnoid blood. Second 3.5 mm hyperdense focus present along the gyral surface of the right frontal parietal junction, axial image 17, also unchanged. Considerable concern for hemorrhagic metastases in this case. No evidence of midline shift. No sign of typical ischemic infarction. No hydrocephalus or extra-axial collection. Vascular: There is atherosclerotic calcification of the major vessels at the base of the brain. Skull: Negative Sinuses/Orbits: Clear/normal Other: None IMPRESSION: No change since earlier today. 21 mm hemorrhage or hemorrhagic lesion in the lateral left temporal lobe with surrounding vasogenic edema and a small amount of adjacent subarachnoid hemorrhage. Second 3.5 mm hyperdense focus affecting the gyral surface in the right frontoparietal junction. Presence of  this second lesion elevates the likelihood of metastatic disease. This would be an unusual primary hemorrhage. Electronically Signed   By: Selena Branch M.D.   On: 09/16/2019 16:50    PHYSICAL EXAM Frail pleasant elderly Caucasian lady currently not in distress.  Skin shows multiple scattered petechiae throughout her body. . Afebrile. Head is  nontraumatic. Neck is supple without bruit.    Cardiac exam no murmur or gallop. Lungs are clear to auscultation. Distal pulses are well felt. Neurological Examination: Awake alert oriented to time place and person.  Mild dysarthria and nonfluent speech and occasional word finding difficulty.  Able to follow simple midline and one-step commands only.  Slight difficulty with repetition and naming.  Comprehension seems better preserved.  Extraocular movements are full range without nystagmus.  Blinks to threat bilaterally.  Hearing is diminished bilaterally.  Tongue midline.  Motor system exam symmetric upper and lower extremity strength with no focal weakness but does have mild action tremor of the right upper extremity.  Sensation is preserved bilaterally.  Gait not tested.  ASSESSMENT/PLAN [name] is a 79 y.o. female with history of HTN, HLD, CLL who presented to [facility] on 09/15/19 for acute ITP who developed altered mental status and slurred speech 1/3.    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 R parietal hyperdensity felt to be ICH. Small vessel disease. Atrophy.   Repeat CT head 1/3 stable  Repeat CT head 1/5 pending   SCDs for VTE prophylaxis  No antithrombotic prior to admission given ITP, now on No antithrombotic given hemorrhage. Avoid APLTS  Therapy recommendations:  pending   Disposition:  pending   Hypertension  Stable . SBP goal < 140 them < 160 at 24h . 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 hematology on board. On prednisone. IVIG. Transfuse additional PLTS today.  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  Continue strict control of blood pressure is systolic goal below 0000000 and avoid antiplatelet agents.  Management of ITP as per hematology with steroids, IVIG and Rituxan  .  Mobilize out of bed.  Transfer to floor bed.  Therapy consults. Discussed with Dr. Ree Branch   Greater than 50% time during this 35-minute visit was spent on counseling and coordination of care and discussion with care team.  Stroke team will sign off.  Kindly call for questions. Antony Contras, MD Medical Director [facility] Pager: [phone] 09/18/2019 2:39 PM   To contact Stroke Continuity provider, please refer to [email or web address] After hours, contact General Neurology

## 2019-09-18 NOTE — Evaluation (Signed)
Occupational Therapy Evaluation Patient Details Name: Selena Branch MRN: [id number] DOB: June 08, 1941 Today's Date: 09/18/2019    History of Present Illness 79 y.o. 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  She received platelet transfusion and follow-up CT scan shows stable appearance of hemorrhage.    Clinical Impression   Pt admitted with above. She demonstrates the below listed deficits and will benefit from continued OT to maximize safety and independence with BADLs.  Pt presents to OT with ? Mild cognitive deficits. She scored a 2/28 on the Short Blessed Test, which is [facility], however, pt reports she is familiar with the cognitive tests and frequently practices reciting the mos backwards, and was surprised that she struggled or made any errors - she reports she doesn't feel this is her baseline - will continue to assess her functional cognition. Anticipate she doesn't need OT follow up unless further testing yields deficits.       Follow Up Recommendations  No OT follow up(unless further testing yields cognitive deficit )    Equipment Recommendations  None recommended by OT    Recommendations for Other Services       Precautions / Restrictions Precautions Precautions: Other (comment) Precaution Comments: low platelets and Hgb; denied dizziness      Mobility Bed Mobility Overal bed mobility: Independent                Transfers Overall transfer level: Modified independent                    Balance Overall balance assessment: Mild deficits observed, not formally tested                                         ADL either performed or assessed with clinical judgement   ADL Overall ADL's : Modified independent                                             Vision Baseline  Vision/History: Glaucoma(wears contacts, but doesn't have them in ) Patient Visual Report: No change from baseline Vision Assessment?: Yes Eye Alignment: Within Functional Limits Ocular Range of Motion: Within Functional Limits Alignment/Gaze Preference: Within Defined Limits Tracking/Visual Pursuits: Able to track stimulus in all quads without difficulty Visual Fields: Right visual field deficit Additional Comments: Pt demonstrates Rt eye superior temporal quadrant field loss that she report 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  She reports that she undergoes regular ophthalmology appointements and has undergone visual field testing "he told me it was almost black in this area"  superior temporal quadrant      Perception Perception Perception Tested?: Yes   Praxis Praxis Praxis tested?: Within functional limits    Pertinent Vitals/Pain Pain Assessment: No/denies pain     Hand Dominance Right   Extremity/Trunk Assessment Upper Extremity Assessment Upper Extremity Assessment: RUE deficits/detail RUE Deficits / Details: Pt with tremor that has been long standing, but she reports has been worse of late  RUE Coordination: decreased fine motor;decreased gross motor   Lower Extremity Assessment Lower Extremity Assessment: Defer to PT evaluation   Cervical / Trunk Assessment  Cervical / Trunk Assessment: Normal   Communication Communication Communication: No difficulties   Cognition Arousal/Alertness: Awake/alert Behavior During Therapy: WFL for tasks assessed/performed Overall Cognitive Status: Impaired/Different from baseline Area of Impairment: Attention;Problem solving                   Current Attention Level: Alternating;Divided         Problem Solving: Difficulty sequencing General Comments: Pt scored 2/28 on the Short Blessed Test, but she is familiar with many of the questions due to her husband's diagnosis of Alzheimer's and he has undergone  cognitive testing.  She states she often practices reciting months of the year in reverse order with him, and while she only made 1 error, was suprised that she struggled a bit with this and reports this is not her baseline   General Comments  BP 183/69    Exercises     Shoulder Instructions      Home Living Family/patient expects to be discharged to:: Private residence Living Arrangements: Spouse/significant other Available Help at Discharge: Family;Available PRN/intermittently Type of Home: House Home Access: Stairs to enter [employer] of Steps: 8 Entrance Stairs-Rails: Right;Left Home Layout: One level     Bathroom Shower/Tub: Tub/shower unit         Home Equipment: Grab bars - tub/shower;Grab bars - toilet(husband has equipment from hip surgery; she is too petite )          Prior Functioning/Environment Level of Independence: Independent        Comments: she sits down in the tub to bathe        OT Problem List: Decreased cognition      OT Treatment/Interventions: Self-care/ADL training;Cognitive remediation/compensation;Therapeutic activities;Patient/family education    OT Goals(Current goals can be found in the care plan section) Acute Rehab OT Goals Patient Stated Goal: To take care of spouse  OT Goal Formulation: With patient Time For Goal Achievement: 10/02/19 Potential to Achieve Goals: Good ADL Goals Additional ADL Goal #1: Pt will be able to manage medications mod I Additional ADL Goal #2: Pt will perform path finding mod I  OT Frequency: Min 2X/week   Barriers to D/C: Decreased caregiver support  Pt is cargiver to spouse        Co-evaluation              AM-PAC OT "6 Clicks" Daily Activity     Outcome Measure Help from another person eating meals?: None Help from another person taking care of personal grooming?: None Help from another person toileting, which includes using toliet, bedpan, or urinal?: None Help from  another person bathing (including washing, rinsing, drying)?: None Help from another person to put on and taking off regular upper body clothing?: None Help from another person to put on and taking off regular lower body clothing?: None 6 Click Score: 24   End of Session Equipment Utilized During Treatment: Gait belt Nurse Communication: Mobility status  Activity Tolerance: Patient tolerated treatment well Patient left: in bed;with call bell/phone within reach  OT Visit Diagnosis: Cognitive communication deficit (R41.841) Symptoms and signs involving cognitive functions: Cerebral infarction                Time: [id number] OT Time Calculation (min): 21 min Charges:  OT General Charges $OT Visit: 1 Visit OT Evaluation $OT Eval Low Complexity: 1 Low  Nilsa Nutting., OTR/L Acute Rehabilitation Services Pager [phone] Office [phone]   Lucille Passy M 09/18/2019, 1:16 PM

## 2019-09-19 DIAGNOSIS — Z298 Encounter for other specified prophylactic measures: Secondary | ICD-10-CM

## 2019-09-19 LAB — COMPREHENSIVE METABOLIC PANEL
ALT: 24 U/L (ref 0–44)
AST: 31 U/L (ref 15–41)
Albumin: 2.6 g/dL — ABNORMAL LOW (ref 3.5–5.0)
Alkaline Phosphatase: 49 U/L (ref 38–126)
Anion gap: 5 (ref 5–15)
BUN: 19 mg/dL (ref 8–23)
CO2: 21 mmol/L — ABNORMAL LOW (ref 22–32)
Calcium: 8.4 mg/dL — ABNORMAL LOW (ref 8.9–10.3)
Chloride: 111 mmol/L (ref 98–111)
Creatinine, Ser: 0.68 mg/dL (ref 0.44–1.00)
GFR calc Af Amer: >60 mL/min (ref >60)
GFR calc non Af Amer: >60 mL/min (ref >60)
Glucose, Bld: 118 mg/dL — ABNORMAL HIGH (ref 70–99)
Potassium: 4 mmol/L (ref 3.5–5.1)
Sodium: 137 mmol/L (ref 135–145)
Total Bilirubin: 0.9 mg/dL (ref 0.3–1.2)
Total Protein: 9.8 g/dL — ABNORMAL HIGH (ref 6.5–8.1)

## 2019-09-19 LAB — CBC WITH DIFFERENTIAL/PLATELET
Abs Immature Granulocytes: 0.07 10*3/uL (ref 0.00–0.07)
Basophils Absolute: 0 10*3/uL (ref 0.0–0.1)
Basophils Relative: 0 %
Eosinophils Absolute: 0 10*3/uL (ref 0.0–0.5)
Eosinophils Relative: 0 %
HCT: 31.1 % — ABNORMAL LOW (ref 36.0–46.0)
Hemoglobin: 10.6 g/dL — ABNORMAL LOW (ref 12.0–15.0)
Immature Granulocytes: 0 %
Lymphocytes Relative: 68 %
Lymphs Abs: 11.2 10*3/uL — ABNORMAL HIGH (ref 0.7–4.0)
MCH: 31.3 pg (ref 26.0–34.0)
MCHC: 34.1 g/dL (ref 30.0–36.0)
MCV: 91.7 fL (ref 80.0–100.0)
Monocytes Absolute: 0.4 10*3/uL (ref 0.1–1.0)
Monocytes Relative: 2 %
Neutro Abs: 4.9 10*3/uL (ref 1.7–7.7)
Neutrophils Relative %: 30 %
Platelets: 67 10*3/uL — ABNORMAL LOW (ref 150–400)
RBC: 3.39 MIL/uL — ABNORMAL LOW (ref 3.87–5.11)
RDW: 15.2 % (ref 11.5–15.5)
WBC: 16.6 10*3/uL — ABNORMAL HIGH (ref 4.0–10.5)
nRBC: 0.1 % (ref 0.0–0.2)

## 2019-09-19 LAB — TYPE AND SCREEN
ABO/RH(D): O POS
Antibody Screen: POSITIVE
DAT, IgG: POSITIVE
Unit division: 0
Unit division: 0

## 2019-09-19 LAB — BPAM RBC
Blood Product Expiration Date: 202101302359
Blood Product Expiration Date: 202101302359
ISSUE DATE / TIME: 202101052305
Unit Type and Rh: 5100
Unit Type and Rh: 5100

## 2019-09-19 LAB — MAGNESIUM: Magnesium: 2.1 mg/dL (ref 1.7–2.4)

## 2019-09-19 LAB — HAPTOGLOBIN: Haptoglobin: 12 mg/dL — ABNORMAL LOW (ref 42–346)

## 2019-09-19 LAB — PHOSPHORUS: Phosphorus: 3.3 mg/dL (ref 2.5–4.6)

## 2019-09-19 MED ORDER — SODIUM CHLORIDE 0.9 % IV SOLN
375.0000 mg/m2 | Freq: Once | INTRAVENOUS | Status: AC
  Administered 2019-09-19: 500 mg via INTRAVENOUS
  Filled 2019-09-19: qty 50

## 2019-09-19 MED ORDER — METHYLPREDNISOLONE SODIUM SUCC 125 MG IJ SOLR
125.0000 mg | Freq: Once | INTRAMUSCULAR | Status: AC
  Administered 2019-09-19: 125 mg via INTRAVENOUS
  Filled 2019-09-19: qty 2

## 2019-09-19 MED ORDER — DIPHENHYDRAMINE HCL 25 MG PO CAPS
50.0000 mg | ORAL_CAPSULE | Freq: Once | ORAL | Status: AC
  Administered 2019-09-19: 50 mg via ORAL
  Filled 2019-09-19: qty 2

## 2019-09-19 MED ORDER — ACETAMINOPHEN 325 MG PO TABS: 650.0000 mg | ORAL_TABLET | Freq: Once | ORAL | Status: DC

## 2019-09-19 MED ORDER — ACETAMINOPHEN 325 MG PO TABS
650.0000 mg | ORAL_TABLET | Freq: Once | ORAL | Status: AC
  Administered 2019-09-19: 650 mg via ORAL
  Filled 2019-09-19: qty 2

## 2019-09-19 MED ORDER — METHYLPREDNISOLONE SODIUM SUCC 125 MG IJ SOLR: 125.0000 mg | Freq: Once | INTRAMUSCULAR | Status: DC

## 2019-09-19 MED ORDER — DIPHENHYDRAMINE HCL 25 MG PO CAPS: 50.0000 mg | ORAL_CAPSULE | Freq: Once | ORAL | Status: DC

## 2019-09-19 MED ORDER — SODIUM CHLORIDE 0.9 % IV SOLN: Freq: Once | INTRAVENOUS | Status: DC

## 2019-09-19 NOTE — Progress Notes (Signed)
STROKE TEAM PROGRESS NOTE   INTERVAL HISTORY Patient is resting comfortably in bed. No changes  Blood pressure adequately controlled.  Selena Branch has been treated with prednisone, IVIG , Tituxanand platelet transfusion for her ITP.     Platelet counts are improving  Vitals:   09/19/19 0245 09/19/19 0406 09/19/19 0720 09/19/19 1127  BP:   (!) 177/81 (!) 163/69  Pulse:   76 76  Resp:   16 18  Temp: 98.2 F (36.8 C) 98.2 F (36.8 C) 98.2 F (36.8 C) 98.2 F (36.8 C)  TempSrc: Oral Oral Oral Oral  SpO2:   96% 92%  Weight:    51.9 kg  Height:    4\' 10"  (1.473 m)    CBC:  Recent Labs  Lab 09/18/19 0245 09/19/19 0332  WBC 15.9* 16.6*  NEUTROABS 4.2 4.9  HGB 7.3* 10.6*  HCT 22.3* 31.1*  MCV 94.5 91.7  PLT 47* 67*    Basic Metabolic Panel:  Recent Labs  Lab 09/18/19 0245 09/19/19 0332  NA 133* 137  K 3.1* 4.0  CL 107 111  CO2 19* 21*  GLUCOSE 109* 118*  BUN 22 19  CREATININE 0.70 0.68  CALCIUM 7.9* 8.4*  MG 1.9 2.1  PHOS 3.0 3.3   Lipid Panel: No results found for: CHOL, TRIG, HDL, CHOLHDL, VLDL, LDLCALC HgbA1c: No results found for: HGBA1C Urine Drug Screen: No results found for: LABOPIA, COCAINSCRNUR, LABBENZ, AMPHETMU, THCU, LABBARB  Alcohol Level No results found for: ETH  IMAGING past 48 hours CT HEAD WO CONTRAST  Result Date: 09/18/2019 CLINICAL DATA:  Stroke follow-up. EXAM: CT HEAD WITHOUT CONTRAST TECHNIQUE: Contiguous axial images were obtained from the base of the skull through the vertex without intravenous contrast. COMPARISON:  Two days ago FINDINGS: Brain: 20 mm hematoma in the superior and anterior left temporal subcortical lobe with rim of edema. 3 mm hyperdensity along the right posterior frontal cortex. No interval change. No visible infarct, hydrocephalus, or midline shift. Vascular: No hyperdense vessel or unexpected calcification. Skull: Normal. Negative for fracture or focal lesion. Sinuses/Orbits: Unremarkable IMPRESSION: Size stable sites of cerebral  hemorrhage. No new abnormality or significant mass effect. Electronically Signed   By: Monte Fantasia M.D.   On: 09/18/2019 04:35    PHYSICAL EXAM Frail pleasant elderly Caucasian lady currently not in distress.  Skin shows multiple scattered petechiae throughout her body. . Afebrile. Head is nontraumatic. Neck is supple without bruit.    Cardiac exam no murmur or gallop. Lungs are clear to auscultation. Distal pulses are well felt. Neurological Examination: Awake alert oriented to time place and person.  Mild dysarthria and nonfluent speech and occasional word finding difficulty.  Able to follow simple midline and one-step commands only.  Slight difficulty with repetition and naming.  Comprehension seems better preserved.  Extraocular movements are full range without nystagmus.  Blinks to threat bilaterally.  Hearing is diminished bilaterally.  Tongue midline.  Motor system exam symmetric upper and lower extremity strength with no focal weakness but does have mild action tremor of the right upper extremity.  Sensation is preserved bilaterally.  Gait not tested.  ASSESSMENT/PLAN Selena Branch is a 79 y.o. female with history of HTN, HLD, CLL who presented to [facility] on 09/15/19 for acute ITP who developed altered mental status and slurred speech 1/3.    Stroke:   L temporal lobe and tiny R parietal lobe ICH in setting of acute ITP w/ PLTS 5  Code Stroke CT head L temporal lobe  ICH w/ mild edema. R parietal hyperdensity felt to be ICH. Small vessel disease. Atrophy.   Repeat CT head 1/3 stable  Repeat CT head 1/5 pending   SCDs for VTE prophylaxis  No antithrombotic prior to admission given ITP, now on No antithrombotic given hemorrhage. Avoid APLTS  Therapy recommendations:  pending   Disposition:  pending   Hypertension  Stable . SBP goal < 140 them < 160 at 24h . 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 hematology on board. On prednisone. IVIG. Transfuse additional PLTS today.  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  Continue strict control of blood pressure is systolic goal below 0000000 and avoid antiplatelet agents.  Management of ITP as per hematology with steroids, IVIG and Rituxan  .  Continue ongoing therapies.   Greater than 50% time during this 15-minute visit was spent on counseling and coordination of care and discussion with care team.  Stroke team will sign off.  Kindly call for questions. Antony Contras, MD Medical Director [facility] Pager: [phone] 09/19/2019 1:11 PM   To contact Stroke Continuity provider, please refer to [email or web address] After hours, contact General Neurology

## 2019-09-19 NOTE — Care Management Important Message (Signed)
Important Message  Patient Details  Name: Selena Branch MRN: [id number] Date of Birth: [birth year]   Medicare Important Message Given:  Yes     Orbie Pyo 09/19/2019, 2:02 PM

## 2019-09-19 NOTE — Progress Notes (Addendum)
Patient received Rituxan infusion Started at 1545 and completed at 80. First dose was titrated, the max rate achieved was 350 mg/ghr due to remainder of bag volume Patient tolerated treatment well No side effects or adverse events Vital signs remained stable- see flowsheet  Peripheral IV L forearm flushed with 25cc NS upon completion of Rituxan   Bag and tubing removed from IV pole and placed in chemotherapy biohazard container.

## 2019-09-19 NOTE — Progress Notes (Signed)
Spoke to RN ask her to obtain an updated height, weight and order chemo bucket and gowns. Selena Branch

## 2019-09-19 NOTE — Progress Notes (Signed)
PROGRESS NOTE    Selena Branch  [id number] DOB: 27-Jun-1941 DOA: 09/15/2019 PCP: [name], MD   Brief Narrative:  HPI on 09/15/2019 by Dr. Karmen Bongo Selena Branch is a 79 y.o. female with medical history significant of HTN; HLD; CLL; and glaucoma presenting with bleeding of gums and nose, petechiae.  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  She was due to go Monday to develop a treatment plan and things went crazy on Monday.  Patient was last admitted from 12/17-19 with thrombocytopenia associated with CLL; oncology was consulted and she was treated with Dexamethasone and IVIG.  Platelets were 63 at the time of d/c and she was recommended to have a BM biopsy the following week.  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  Clearly, her blood did not like the platelets and she had to come back.  She was given platelets Tuesday noticed bleeding from the gums and nose Thursday-Friday and then developed diffuse petechiae.  Things were worse this AM and she "realized I really had an issue."  Her prior hospitalization kept her without symptoms through 12/29.  No falls.  No headaches.  Interim history Patient admitted with intracranial bleed and ITP. Patient was transferred to ICU. Neurology and hem/onc consulted. Patient was started on IVIG and steroids. Oncology planning to start Rituxan today.  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 Irene Limbo, hem/onc, patient has received 2 doses of IVIG, high-dose. Planning for Rituxan dose today. Goal for platelets >30K. Zoloft  discontinued. -Currently platelets are stable, 67 -Continue to monitor CBC  Left temporal lobe and right parietal lobe ICH -In the setting of ITP -CT head on 09/15/2018: 56mm hemorrhage or hemorrhagic lesion of the lateral left temporal lobe with surrounding vasogenic edema and small amount of adjacent subarachnoid hemorrhage.  Second 3.5 mm hypodense focus affecting the gyral surface on the right frontoparietal junction.  Presence of the second lesion elevates the likelihood of metastatic disease. -Neurology consulted and appreciated -Patient was placed on Cleviprex drip -repeat CT head today: Size stable sites of cerebral hemorrhage.  No new abnormality or significant mass-effect. -Discussed with Dr. Leonie Man, patient is stable from a neurology standpoint -PT/OT consulted- no further therapy needs  Hyponateremia -Sodium was 131, is up to 137 today -continue to monitor BMP   Chronic normocytic anemia -hemoglobin was 11 on admission -Suspect drop was dilutional component along with the above -patient was given 1u PRBCs on 09/17/2018 -Hemoglobin currently 10.6 -Continue to monitor CBC  Hypokalemia -Resolved with replacement -magnesium 2.1 -Continue to monitor BMP  Leukocytosis -Suspect reactive as well as steroid use -continue to monitor CBC   Hyperlipidemia -continue statin  Essential hypertension -Continue amlodipine, lisinopril  DVT Prophylaxis  SCDs  Code Status: DNR  Family Communication: None at bedside  Disposition Plan: Admitted.  Oncology planning for Rituxan dose today. Disposition likely home when cleared by hematology/oncology.   Consultants Neurology Hematology/oncology PCCM  Procedures  None  Antibiotics   Anti-infectives (From admission, onward)   None      Subjective:   Aneka Dantona seen and examined today.  Patient with no complaints this morning.  Denies headache dizziness or headache, chest pain or shortness of breath, abdominal pain, nausea or  vomiting, diarrhea or constipation. Objective:   Vitals:   09/19/19 0244 09/19/19 0245 09/19/19 0406 09/19/19 0720  BP: (!) 169/90   (!) 177/81  Pulse: 70   76  Resp: 18   16  Temp: 98.2 F (36.8 C) 98.2 F (36.8 C) 98.2 F (36.8 C) 98.2 F (36.8 C)  TempSrc:  Oral Oral Oral  SpO2:    96%  Weight:      Height:        Intake/Output Summary (Last 24 hours) at 09/19/2019 0932 Last data filed at 09/19/2019 0244 Gross per 24 hour  Intake 466.25 ml  Output -  Net 466.25 ml   Filed Weights   09/15/19 1609 09/16/19 1300  Weight: 47.7 kg 48.6 kg   Exam  General: Well developed, well nourished, NAD, appears stated age  36: NCAT, mucous membranes moist.    Cardiovascular: S1 S2 auscultated, RRR  Respiratory: Clear to auscultation bilaterally  Abdomen: Soft, nontender, nondistended, + bowel sounds  Extremities: warm dry without cyanosis clubbing or edema  Neuro: AAOx3, nonfocal  Psych: Appropriate mood and affect, pleasant   Data Reviewed: I have personally reviewed following labs and imaging studies  CBC: Recent Labs  Lab 09/16/19 1700 09/17/19 0131 09/17/19 1549 09/18/19 0245 09/19/19 0332  WBC 26.0* 18.5* 18.6* 15.9* 16.6*  NEUTROABS 6.8 5.0 11.3* 4.2 4.9  HGB 9.7* 8.8* 8.5* 7.3* 10.6*  HCT 28.7* 26.6* 25.5* 22.3* 31.1*  MCV 92.6 95.0 93.1 94.5 91.7  PLT 104* 27* 62* 47* 67*   Basic Metabolic Panel: Recent Labs  Lab 09/15/19 0904 09/16/19 0133 09/17/19 0131 09/18/19 0245 09/19/19 0332  NA 140 138 131* 133* 137  K 3.8 4.2 3.7 3.1* 4.0  CL 107 108 104 107 111  CO2 24 22 21* 19* 21*  GLUCOSE 138* 126* 118* 109* 118*  BUN 18 17 18 22 19   CREATININE 0.81 0.64 0.77 0.70 0.68  CALCIUM 9.1 9.0 7.9* 7.9* 8.4*  MG  --   --  1.8 1.9 2.1  PHOS  --   --  3.2 3.0 3.3   GFR: Estimated Creatinine Clearance: 36.8 mL/min (by C-G formula based on SCr of 0.68 mg/dL). Liver Function Tests: Recent Labs  Lab 09/15/19 0904 09/17/19 0131 09/19/19 0332  AST 35  31 31  ALT 22 15 24   ALKPHOS 53 43 49  BILITOT 0.8 0.6 0.9  PROT 6.7 7.9 9.8*  ALBUMIN 3.5 2.8* 2.6*   No results for input(s): LIPASE, AMYLASE in the last 168 hours. No results for input(s): AMMONIA in the last 168 hours. Coagulation Profile: Recent Labs  Lab 09/15/19 1008 09/16/19 1141  INR 1.0 1.0   Cardiac Enzymes: No results for input(s): CKTOTAL, CKMB, CKMBINDEX, TROPONINI in the last 168 hours. BNP (last 3 results) No results for input(s): PROBNP in the last 8760 hours. HbA1C: No results for input(s): HGBA1C in the last 72 hours. CBG: Recent Labs  Lab 09/16/19 1009  GLUCAP 110*   Lipid Profile: No results for input(s): CHOL, HDL, LDLCALC, TRIG, CHOLHDL, LDLDIRECT in the last 72 hours. Thyroid Function Tests: No results for input(s): TSH, T4TOTAL, FREET4, T3FREE, THYROIDAB in the last 72 hours. Anemia Panel: Recent Labs    09/18/19 0856  RETICCTPCT 3.2*   Urine analysis:    Component Value Date/Time   COLORURINE YELLOW 05/06/2010 [place] 05/06/2010 1611  LABSPEC 1.026 05/06/2010 1611   PHURINE 5.0 05/06/2010 1611   GLUCOSEU NEGATIVE 05/06/2010 1611   HGBUR NEGATIVE 05/06/2010 1611   BILIRUBINUR SMALL (A) 05/06/2010 1611   KETONESUR 15 (A) 05/06/2010 1611   PROTEINUR NEGATIVE 05/06/2010 1611   UROBILINOGEN 0.2 05/06/2010 1611   NITRITE NEGATIVE 05/06/2010 1611   LEUKOCYTESUR  05/06/2010 1611    NEGATIVE MICROSCOPIC NOT DONE ON URINES WITH NEGATIVE PROTEIN, BLOOD, LEUKOCYTES, NITRITE, OR GLUCOSE <1000 mg/dL.   Sepsis Labs: @LABRCNTIP (procalcitonin:4,lacticidven:4)  ) Recent Results (from the past 240 hour(s))  Respiratory Panel by RT PCR (Flu A&B, Covid) - Nasopharyngeal Swab     Status: None   Collection Time: 09/15/19  3:40 PM   Specimen: Nasopharyngeal Swab  Result Value Ref Range Status   SARS Coronavirus 2 by RT PCR NEGATIVE NEGATIVE Final    Comment: (NOTE) SARS-CoV-2 target nucleic acids are NOT DETECTED. The SARS-CoV-2  RNA is generally detectable in upper respiratoy specimens during the acute phase of infection. The lowest concentration of SARS-CoV-2 viral copies this assay can detect is 131 copies/mL. A negative result does not preclude SARS-Cov-2 infection and should not be used as the sole basis for treatment or other patient management decisions. A negative result may occur with  improper specimen collection/handling, submission of specimen other than nasopharyngeal swab, presence of viral mutation(s) within the areas targeted by this assay, and inadequate number of viral copies (<131 copies/mL). A negative result must be combined with clinical observations, patient history, and epidemiological information. The expected result is Negative. Fact Sheet for Patients:  PinkCheek.be Fact Sheet for Healthcare Providers:  GravelBags.it This test is not yet ap proved or cleared by the [place] FDA and  has been authorized for detection and/or diagnosis of SARS-CoV-2 by FDA under an Emergency Use Authorization (EUA). This EUA will remain  in effect (meaning this test can be used) for the duration of the COVID-19 declaration under Section 564(b)(1) of the Act, 21 U.S.C. section 360bbb-3(b)(1), unless the authorization is terminated or revoked sooner.    Influenza A by PCR NEGATIVE NEGATIVE Final   Influenza B by PCR NEGATIVE NEGATIVE Final    Comment: (NOTE) The Xpert Xpress SARS-CoV-2/FLU/RSV assay is intended as an aid in  the diagnosis of influenza from Nasopharyngeal swab specimens and  should not be used as a sole basis for treatment. Nasal washings and  aspirates are unacceptable for Xpert Xpress SARS-CoV-2/FLU/RSV  testing. Fact Sheet for Patients: PinkCheek.be Fact Sheet for Healthcare Providers: GravelBags.it This test is not yet approved or cleared by the [place] FDA  and  has been authorized for detection and/or diagnosis of SARS-CoV-2 by  FDA under an Emergency Use Authorization (EUA). This EUA will remain  in effect (meaning this test can be used) for the duration of the  Covid-19 declaration under Section 564(b)(1) of the Act, 21  U.S.C. section 360bbb-3(b)(1), unless the authorization is  terminated or revoked. Performed at [facility], [place] [address] [place], [place] [address]   MRSA PCR Screening     Status: None   Collection Time: 09/16/19  1:14 PM   Specimen: Nasal Mucosa; Nasopharyngeal  Result Value Ref Range Status   MRSA by PCR NEGATIVE NEGATIVE Final    Comment:        The GeneXpert MRSA Assay (FDA approved for NASAL specimens only), is one component of a comprehensive MRSA colonization surveillance program. It is not intended to diagnose MRSA infection nor to guide or monitor treatment for MRSA  infections. Performed at [facility], [place] [address], [place], [place] [address]       Radiology Studies: CT HEAD WO CONTRAST  Result Date: 09/18/2019 CLINICAL DATA:  Stroke follow-up. EXAM: CT HEAD WITHOUT CONTRAST TECHNIQUE: Contiguous axial images were obtained from the base of the skull through the vertex without intravenous contrast. COMPARISON:  Two days ago FINDINGS: Brain: 20 mm hematoma in the superior and anterior left temporal subcortical lobe with rim of edema. 3 mm hyperdensity along the right posterior frontal cortex. No interval change. No visible infarct, hydrocephalus, or midline shift. Vascular: No hyperdense vessel or unexpected calcification. Skull: Normal. Negative for fracture or focal lesion. Sinuses/Orbits: Unremarkable IMPRESSION: Size stable sites of cerebral hemorrhage. No new abnormality or significant mass effect. Electronically Signed   By: Monte Fantasia M.D.   On: 09/18/2019 04:35     Scheduled Meds: . amLODipine  10 mg Oral QHS  . atorvastatin  40 mg Oral Daily  . dorzolamide-timolol  1  drop Both Eyes Daily  . latanoprost  1 drop Both Eyes QHS  . lisinopril  10 mg Oral Daily  . predniSONE  50 mg Oral Q breakfast   Continuous Infusions:   LOS: 4 days   Time Spent in minutes   45 minutes  Ozetta Flatley D.O. on 09/19/2019 at 9:32 AM  Between 7am to 7pm - Please see pager noted on [email or web address]  After 7pm go to [email or web address]  And look for the night coverage person covering for me after hours  [facility] Office  [phone]

## 2019-09-19 NOTE — Progress Notes (Signed)
Spoke to [facility] (pharmacy) advised I will work with Dr. Irene Limbo to possibly begin the infusion tomorrow 09/20/19. Selena Branch

## 2019-09-19 NOTE — Progress Notes (Signed)
Jethro Bolus, RN from [facility] will administer Rituxan this afternoon.  Rituxan being mixed at [facility] and will be transported by Clarise Cruz to [facility].  Notified patient's bedside nurse of plan and to go ahead and administer pre-medications as soon as available.  Zandra Abts [facility]  09/19/2019  2:34 PM

## 2019-09-19 NOTE — Progress Notes (Addendum)
HEMATOLOGY-ONCOLOGY PROGRESS NOTE  SUBJECTIVE: Denies current bleeding.  Reports ongoing rash to her bilateral lower extremities but she thinks this is getting lighter. IVIG very well.  Remains on prednisone 50 mg daily.  Scheduled to begin rituximab later today.  Platelet count 67,000 this morning and hemoglobin has improved to 10.6.  Received 1 unit PRBCs yesterday.  Asking to go home.  REVIEW OF SYSTEMS:   As noted in the HPI.  I have reviewed the past medical history, past surgical history, social history and family history with the patient and they are unchanged from previous note.   PHYSICAL EXAMINATION:  Vitals:   09/19/19 1127 09/19/19 1524  BP: (!) 163/69 (!) 170/71  Pulse: 76 80  Resp: 18 16  Temp: 98.2 F (36.8 C)   SpO2: 92%    Filed Weights   09/15/19 1609 09/16/19 1300 09/19/19 1127  Weight: 105 lb 2.6 oz (47.7 kg) 107 lb 2.3 oz (48.6 kg) 114 lb 6.7 oz (51.9 kg)    Intake/Output from previous day: 01/05 0701 - 01/06 0700 In: 466.3 [Blood:466.3] Out: -   GENERAL:alert, no distress and comfortable SKIN: Multiple bruises and petechiae on her arms, chest, and legs EYES: normal, Conjunctiva are pink and non-injected, sclera clear OROPHARYNX: Petechiae noted in the posterior pharynx NECK: supple, thyroid normal size, non-tender, without nodularity LYMPH:  no palpable lymphadenopathy in the cervical, axillary or inguinal LUNGS: clear to auscultation and percussion with normal breathing effort HEART: regular rate & rhythm and no murmurs and no lower extremity edema ABDOMEN:abdomen soft, non-tender and normal bowel sounds Musculoskeletal:no cyanosis of digits and no clubbing  NEURO: alert & oriented x 3 with fluent speech, no focal motor/sensory deficits  LABORATORY DATA:  I have reviewed the data as listed CMP Latest Ref Rng & Units 09/19/2019 09/18/2019 09/17/2019  Glucose 70 - 99 mg/dL 118(H) 109(H) 118(H)  BUN 8 - 23 mg/dL 19 22 18   Creatinine 0.44 - 1.00 mg/dL 0.68  0.70 0.77  Sodium 135 - 145 mmol/L 137 133(L) 131(L)  Potassium 3.5 - 5.1 mmol/L 4.0 3.1(L) 3.7  Chloride 98 - 111 mmol/L 111 107 104  CO2 22 - 32 mmol/L 21(L) 19(L) 21(L)  Calcium 8.9 - 10.3 mg/dL 8.4(L) 7.9(L) 7.9(L)  Total Protein 6.5 - 8.1 g/dL 9.8(H) - 7.9  Total Bilirubin 0.3 - 1.2 mg/dL 0.9 - 0.6  Alkaline Phos 38 - 126 U/L 49 - 43  AST 15 - 41 U/L 31 - 31  ALT 0 - 44 U/L 24 - 15    Lab Results  Component Value Date   WBC 16.6 (H) 09/19/2019   HGB 10.6 (L) 09/19/2019   HCT 31.1 (L) 09/19/2019   MCV 91.7 09/19/2019   PLT 67 (L) 09/19/2019   NEUTROABS 4.9 09/19/2019    CT HEAD WO CONTRAST  Result Date: 09/18/2019 CLINICAL DATA:  Stroke follow-up. EXAM: CT HEAD WITHOUT CONTRAST TECHNIQUE: Contiguous axial images were obtained from the base of the skull through the vertex without intravenous contrast. COMPARISON:  Two days ago FINDINGS: Brain: 20 mm hematoma in the superior and anterior left temporal subcortical lobe with rim of edema. 3 mm hyperdensity along the right posterior frontal cortex. No interval change. No visible infarct, hydrocephalus, or midline shift. Vascular: No hyperdense vessel or unexpected calcification. Skull: Normal. Negative for fracture or focal lesion. Sinuses/Orbits: Unremarkable IMPRESSION: Size stable sites of cerebral hemorrhage. No new abnormality or significant mass effect. Electronically Signed   By: Monte Fantasia M.D.   On: 09/18/2019  04:35   CT HEAD WO CONTRAST  Result Date: 09/16/2019 CLINICAL DATA:  Follow-up intracranial hemorrhage. EXAM: CT HEAD WITHOUT CONTRAST TECHNIQUE: Contiguous axial images were obtained from the base of the skull through the vertex without intravenous contrast. COMPARISON:  Earlier same day.  08/09/2018. FINDINGS: Brain: No change since earlier today. Maximal 21 mm in diameter intraparenchymal hemorrhage in the lateral left temporal lobe with vasogenic edema and a small amount of adjacent subarachnoid blood. Second 3.5 mm  hyperdense focus present along the gyral surface of the right frontal parietal junction, axial image 17, also unchanged. Considerable concern for hemorrhagic metastases in this case. No evidence of midline shift. No sign of typical ischemic infarction. No hydrocephalus or extra-axial collection. Vascular: There is atherosclerotic calcification of the major vessels at the base of the brain. Skull: Negative Sinuses/Orbits: Clear/normal Other: None IMPRESSION: No change since earlier today. 21 mm hemorrhage or hemorrhagic lesion in the lateral left temporal lobe with surrounding vasogenic edema and a small amount of adjacent subarachnoid hemorrhage. Second 3.5 mm hyperdense focus affecting the gyral surface in the right frontoparietal junction. Presence of this second lesion elevates the likelihood of metastatic disease. This would be an unusual primary hemorrhage. Electronically Signed   By: Nelson Chimes M.D.   On: 09/16/2019 16:50   CT CHEST W CONTRAST  Result Date: 08/31/2019 CLINICAL DATA:  Inpatient. CLL, presenting for staging. New anemia and severe thrombocytopenia. EXAM: CT CHEST, ABDOMEN, AND PELVIS WITH CONTRAST TECHNIQUE: Multidetector CT imaging of the chest, abdomen and pelvis was performed following the standard protocol during bolus administration of intravenous contrast. CONTRAST:  116mL OMNIPAQUE IOHEXOL 300 MG/ML  SOLN COMPARISON:  05/07/2010 chest CT angiogram. FINDINGS: CT CHEST FINDINGS Cardiovascular: Normal heart size. No significant pericardial effusion/thickening. Left anterior descending coronary atherosclerosis. Atherosclerotic nonaneurysmal thoracic aorta. Normal caliber pulmonary arteries. No central pulmonary emboli. Mediastinum/Nodes: No discrete thyroid nodules. Unremarkable esophagus. No pathologically enlarged axillary, mediastinal or hilar lymph nodes. Lungs/Pleura: No pneumothorax. No pleural effusion. No acute consolidative airspace disease or lung masses. A few scattered  perifissural and subpleural solid pulmonary nodules in both lungs measuring up to 5 mm in the anterior right lower lobe (series 5/image 75), all stable since 05/07/2010 chest CT, considered benign. No new significant pulmonary nodules. Mild platelike scarring versus atelectasis at the dependent lung bases bilaterally. Musculoskeletal: No aggressive appearing focal osseous lesions. Mild thoracic spondylosis. Chronic mild posterior T10 vertebral compression deformity. CT ABDOMEN PELVIS FINDINGS Hepatobiliary: Normal liver with no liver mass. Normal gallbladder with no radiopaque cholelithiasis. No biliary ductal dilatation. Small periampullary duodenal diverticulum. Pancreas: Normal, with no mass or duct dilation. Spleen: Normal size. No mass. Adrenals/Urinary Tract: Normal adrenals. Normal kidneys with no hydronephrosis and no renal mass. Normal bladder. Stomach/Bowel: Small hiatal hernia. Otherwise normal nondistended stomach. Normal caliber small bowel with no small bowel wall thickening. Normal appendix. Oral contrast transits to the left colon. Moderate sigmoid diverticulosis, with no large bowel wall thickening or significant pericolonic fat stranding. Vascular/Lymphatic: Atherosclerotic nonaneurysmal abdominal aorta. Patent portal, splenic, hepatic and renal veins. Mildly enlarged 1.0 cm right inguinal node (series 3/image 105). Mild bilateral external iliac adenopathy up to 1.1 cm on the right (series 3/image 97) and 1.0 cm on the left (series 3/image 99). Mildly enlarged 1.2 cm left common iliac node (series 3/image 82). Left para-aortic adenopathy up to 1.2 cm (series 3/image 70). Aortocaval adenopathy up to 1.1 cm (series 3/image 73). Reproductive: Normal uterus. No right adnexal mass. Simple 5.3 cm left adnexal cyst (series  3/image 94). Other: No pneumoperitoneum, ascites or focal fluid collection. Musculoskeletal: No aggressive appearing focal osseous lesions. Moderate lower lumbar degenerative changes.  IMPRESSION: 1. Mild multistation lymphadenopathy in the retroperitoneum and bilateral pelvis as detailed, compatible with lymphoma. Normal size spleen. No thoracic adenopathy. 2. One vessel coronary atherosclerosis. 3. Moderate sigmoid diverticulosis. 4. Simple 5.3 cm left adnexal cyst. Pelvic ultrasound evaluation recommended. This recommendation follows ACR consensus guidelines: White Paper of the ACR Incidental Findings Committee II on Adnexal Findings. J Am Coll Radiol 2013:10:675-681. 5.  Aortic Atherosclerosis (ICD10-I70.0). Electronically Signed   By: Ilona Sorrel M.D.   On: 08/31/2019 12:14   CT ABDOMEN PELVIS W CONTRAST  Result Date: 08/31/2019 CLINICAL DATA:  Inpatient. CLL, presenting for staging. New anemia and severe thrombocytopenia. EXAM: CT CHEST, ABDOMEN, AND PELVIS WITH CONTRAST TECHNIQUE: Multidetector CT imaging of the chest, abdomen and pelvis was performed following the standard protocol during bolus administration of intravenous contrast. CONTRAST:  187mL OMNIPAQUE IOHEXOL 300 MG/ML  SOLN COMPARISON:  05/07/2010 chest CT angiogram. FINDINGS: CT CHEST FINDINGS Cardiovascular: Normal heart size. No significant pericardial effusion/thickening. Left anterior descending coronary atherosclerosis. Atherosclerotic nonaneurysmal thoracic aorta. Normal caliber pulmonary arteries. No central pulmonary emboli. Mediastinum/Nodes: No discrete thyroid nodules. Unremarkable esophagus. No pathologically enlarged axillary, mediastinal or hilar lymph nodes. Lungs/Pleura: No pneumothorax. No pleural effusion. No acute consolidative airspace disease or lung masses. A few scattered perifissural and subpleural solid pulmonary nodules in both lungs measuring up to 5 mm in the anterior right lower lobe (series 5/image 75), all stable since 05/07/2010 chest CT, considered benign. No new significant pulmonary nodules. Mild platelike scarring versus atelectasis at the dependent lung bases bilaterally.  Musculoskeletal: No aggressive appearing focal osseous lesions. Mild thoracic spondylosis. Chronic mild posterior T10 vertebral compression deformity. CT ABDOMEN PELVIS FINDINGS Hepatobiliary: Normal liver with no liver mass. Normal gallbladder with no radiopaque cholelithiasis. No biliary ductal dilatation. Small periampullary duodenal diverticulum. Pancreas: Normal, with no mass or duct dilation. Spleen: Normal size. No mass. Adrenals/Urinary Tract: Normal adrenals. Normal kidneys with no hydronephrosis and no renal mass. Normal bladder. Stomach/Bowel: Small hiatal hernia. Otherwise normal nondistended stomach. Normal caliber small bowel with no small bowel wall thickening. Normal appendix. Oral contrast transits to the left colon. Moderate sigmoid diverticulosis, with no large bowel wall thickening or significant pericolonic fat stranding. Vascular/Lymphatic: Atherosclerotic nonaneurysmal abdominal aorta. Patent portal, splenic, hepatic and renal veins. Mildly enlarged 1.0 cm right inguinal node (series 3/image 105). Mild bilateral external iliac adenopathy up to 1.1 cm on the right (series 3/image 97) and 1.0 cm on the left (series 3/image 99). Mildly enlarged 1.2 cm left common iliac node (series 3/image 82). Left para-aortic adenopathy up to 1.2 cm (series 3/image 70). Aortocaval adenopathy up to 1.1 cm (series 3/image 73). Reproductive: Normal uterus. No right adnexal mass. Simple 5.3 cm left adnexal cyst (series 3/image 94). Other: No pneumoperitoneum, ascites or focal fluid collection. Musculoskeletal: No aggressive appearing focal osseous lesions. Moderate lower lumbar degenerative changes. IMPRESSION: 1. Mild multistation lymphadenopathy in the retroperitoneum and bilateral pelvis as detailed, compatible with lymphoma. Normal size spleen. No thoracic adenopathy. 2. One vessel coronary atherosclerosis. 3. Moderate sigmoid diverticulosis. 4. Simple 5.3 cm left adnexal cyst. Pelvic ultrasound evaluation  recommended. This recommendation follows ACR consensus guidelines: White Paper of the ACR Incidental Findings Committee II on Adnexal Findings. J Am Coll Radiol 2013:10:675-681. 5.  Aortic Atherosclerosis (ICD10-I70.0). Electronically Signed   By: Ilona Sorrel M.D.   On: 08/31/2019 12:14   CT HEAD CODE STROKE  WO CONTRAST  Result Date: 09/16/2019 CLINICAL DATA:  Code stroke. Neuro deficit, acute, stroke suspected. Additional history provided: Last known normal 9:50 AM, slurred speech and confusion EXAM: CT HEAD WITHOUT CONTRAST TECHNIQUE: Contiguous axial images were obtained from the base of the skull through the vertex without intravenous contrast. COMPARISON:  CT head 08/09/2018, brain MRI 01/14/2016 FINDINGS: Brain: There is an acute parenchymal hemorrhage centered within the left temporal lobe with mild surrounding edema. The hemorrhage measures 1.6 x 1.6 x 2.0 cm. Trace surrounding subarachnoid hemorrhage. Minimal regional mass effect. No effacement of the ventricular system or midline shift. Additional 3 mm hyperdense focus within the cortex of the right parietal lobe (series 6, image 11) (series 3, image 17). No extra-axial fluid collection. Mild ill-defined hypoattenuation within the cerebral white matter is nonspecific, but consistent with chronic small vessel ischemic disease. Mild generalized parenchymal atrophy. Vascular: No hyperdense vessel.  Atherosclerotic calcifications. Skull: Normal. Negative for fracture or focal lesion. Sinuses/Orbits: Visualized orbits demonstrate no acute abnormality. Other: No significant paranasal sinus disease or mastoid effusion at the imaged levels. These results were called by telephone at the time of interpretation on 09/16/2019 at 10:32 am to provider Dr. Rory Percy, who verbally acknowledged these results. IMPRESSION: 2 cm left temporal lobe parenchymal hemorrhage with mild surrounding edema. Trace surrounding subarachnoid hemorrhage. Contrast-enhanced brain MRI is  recommended once the hematoma involutes to assess for an underlying lesion. Additional 3 mm hyperdense focus within the right parietal cortex. This may reflect an additional tiny parenchymal hemorrhage or other hyperdense lesion. This too should have MRI follow-up. Generalized parenchymal atrophy and chronic small vessel ischemic disease. Electronically Signed   By: Kellie Simmering DO   On: 09/16/2019 10:34   VAS [place] LOWER EXTREMITY VENOUS (DVT)  Result Date: 09/01/2019  Lower Venous Study Indications: Edema.  Comparison Study: no prior Performing Technologist: June Leap RDMS, RVT  Examination Guidelines: A complete evaluation includes B-mode imaging, spectral Doppler, color Doppler, and power Doppler as needed of all accessible portions of each vessel. Bilateral testing is considered an integral part of a complete examination. Limited examinations for reoccurring indications may be performed as noted.  +-----+---------------+---------+-----------+----------+--------------+ RIGHTCompressibilityPhasicitySpontaneityPropertiesThrombus Aging +-----+---------------+---------+-----------+----------+--------------+ CFV  Full           Yes      Yes                                 +-----+---------------+---------+-----------+----------+--------------+   +---------+---------------+---------+-----------+----------+--------------+ LEFT     CompressibilityPhasicitySpontaneityPropertiesThrombus Aging +---------+---------------+---------+-----------+----------+--------------+ CFV      Full           Yes      Yes                                 +---------+---------------+---------+-----------+----------+--------------+ SFJ      Full                                                        +---------+---------------+---------+-----------+----------+--------------+ FV Prox  Full                                                         +---------+---------------+---------+-----------+----------+--------------+  FV Mid   Full                                                        +---------+---------------+---------+-----------+----------+--------------+ FV DistalFull                                                        +---------+---------------+---------+-----------+----------+--------------+ PFV      Full                                                        +---------+---------------+---------+-----------+----------+--------------+ POP      Full           Yes      Yes                                 +---------+---------------+---------+-----------+----------+--------------+ PTV      Full                                                        +---------+---------------+---------+-----------+----------+--------------+ PERO     Full                                                        +---------+---------------+---------+-----------+----------+--------------+     Summary: Right: No evidence of common femoral vein obstruction. Left: There is no evidence of deep vein thrombosis in the lower extremity. No cystic structure found in the popliteal fossa.  *See table(s) above for measurements and observations. Electronically signed by Harold Barban MD on 09/01/2019 at 4:38:59 PM.    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   Patient had excellent response with previous induction dexamethasone and IVIG with normalization of platelets.  Had rapid relapse suggesting some degree of refractoriness to initial treatment for her acute ITP. Plan -Continue prednisone 50 mg daily -Zoloft has been discontinued due to concerns with platelet dysfunction in the setting of significant thrombocytopenia. -Avoid any antiplatelet therapies. -Status post 2 doses of IVIG with improvement of her platelet count. -Repeat CT was noted to be stable without progression of her bleed. -We will plan to  begin Rituxan weekly x4 starting today.  Adverse effects of been discussed with the patient and she is agreeable to proceed.  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 -Hematology will continue to follow closely.     LOS: 4 days   Mikey Bussing, DNP, AGPCNP-BC, AOCNP 09/19/19   ADDENDUM  .  Patient was Personally and independently interviewed, examined and relevant elements of the history of present illness were reviewed in details and an assessment and plan was created. All elements of the patient's history of present illness , assessment and plan were discussed in details with Mikey Bussing, DNP. The above documentation reflects our combined findings assessment and plan.  Patient reports no issues with bleeding. Spent significant care time developing and implementing logistics of getting her first dose of Rituxan in the hospital.  Patient was nearly done with Rituxan when I saw her in the evening and was tolerating it well without any prohibitive toxicities or infusion reactions. Platelet counts are uptrending at [facility] today with no issues with bleeding. Blood pressure optimization per hospitalist and neurology. CBC the following morning on 09/20/2019 shows platelet counts have improved 110k. Patient is stable to discharge from hematology standpoint.  She will be set up for second dose of Rituxan labs and MD visit in 1 week. Would recommend continuing prednisone at 50 mg daily on discharge. All of the patient's questions answered in details Blood pressure optimization and recommendations per hospitalist with a plan to follow-up with PCP to continue optimizing blood pressure control. Appreciate excellent input from hospitalist and neurology teams. Patient may discharge on 09/20/2019 from hematology standpoint.  Sullivan Lone MD MS Total  time 40 minutes more than 50% of time on direct patient contact counseling and coordination of care.

## 2019-09-19 NOTE — Progress Notes (Signed)
Occupational Therapy Treatment Patient Details Name: Selena Branch MRN: [id number] DOB: [birth year] Today's Date: 09/19/2019    History of present illness 79 y.o. 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  She received platelet transfusion and follow-up CT scan shows stable appearance of hemorrhage.    OT comments  Today's session focused on further assessment of cognition utilizing the Pill Box Test. Pt required increased time to complete the assessment indicating a failed score. She did not make any mistakes, but she demonstrated difficulty with organization and working memory, forgetting which bottles she had or had not completed.  She reports she does not feel she is at her baseline cognition level. Pt manages her medication and her husband's medication and due to limitations cognitively put pt and her husband at an increased safety risk. Pt would greatly benefit from outpatient OT to address higher level executive functioning skills to maximize pt's independence and safety with IADL completion. No additional acute OT needs identified, all additional OT needs to be addressed at next venue. OT is signing off. Thank you for referral.    Follow Up Recommendations  Outpatient OT    Equipment Recommendations  None recommended by OT    Recommendations for Other Services      Precautions / Restrictions         Mobility Bed Mobility Overal bed mobility: Independent                Transfers Overall transfer level: Independent Equipment used: None                  Balance Overall balance assessment: Independent                                         ADL either performed or assessed with clinical judgement   ADL Overall ADL's : Modified independent                                        General ADL Comments: ambulated to the bathroom with assistance to manage lines, however feel pt could have managed these indpendently;intermittent stabilization on furniture, no loss of balance     Vision   Vision Assessment?: Yes Visual Fields: Right visual field deficit Additional Comments: Pt with visual field deficit, demonstrates independence with compensatory strategies during functional mobility, reading, and ADL completion   Perception     Praxis      Cognition Arousal/Alertness: Awake/alert Behavior During Therapy: WFL for tasks assessed/performed Overall Cognitive Status: Impaired/Different from baseline Area of Impairment: Attention;Problem solving                   Current Attention Level: Alternating         Problem Solving: Slow processing;Difficulty sequencing General Comments: upon arrival, pt requesting medication;good awareness of time and adherence to medication as she had not received her BP medication yet;pt required increased time to complete the pillbox assessment, She did not make an error during the 5 min, but due to increased time required pt failed this assessment. Pt demonstrated difficutly with organizing pill bottles and working memory (forgetting which bottles she had already completed);pt reports she manages  her husband's medication as well as her own. Pt reports not feeling like she is at her baseline and is having  difficulty with processing speed.         Exercises     Shoulder Instructions       General Comments BP 188/83 RN notified    Pertinent Vitals/ Pain       Pain Assessment: No/denies pain  Home Living                                          Prior Functioning/Environment              Frequency  Min 2X/week        Progress Toward Goals  OT Goals(current goals can now be found in the care plan section)  Progress towards OT goals: Progressing toward goals  Acute Rehab OT Goals Patient  Stated Goal: to get back to baseline cognition OT Goal Formulation: With patient Time For Goal Achievement: 10/02/19 Potential to Achieve Goals: Good ADL Goals Additional ADL Goal #1: Pt will be able to manage medications mod I Additional ADL Goal #2: Pt will perform path finding mod I  Plan Discharge plan needs to be updated    Co-evaluation                 AM-PAC OT "6 Clicks" Daily Activity     Outcome Measure   Help from another person eating meals?: None Help from another person taking care of personal grooming?: None Help from another person toileting, which includes using toliet, bedpan, or urinal?: None Help from another person bathing (including washing, rinsing, drying)?: None Help from another person to put on and taking off regular upper body clothing?: None Help from another person to put on and taking off regular lower body clothing?: None 6 Click Score: 24    End of Session    OT Visit Diagnosis: Cognitive communication deficit (R41.841) Symptoms and signs involving cognitive functions: Cerebral infarction   Activity Tolerance Patient tolerated treatment well   Patient Left in bed;with call bell/phone within reach   Nurse Communication Mobility status        Time: [id number] OT Time Calculation (min): 35 min  Charges: OT General Charges $OT Visit: 1 Visit OT Treatments $Self Care/Home Management : 8-22 mins $Cognitive Funtion inital: Initial 15 mins  Selena Branch OTR/L Acute Rehabilitation Services Office: [phone]    Selena Branch 09/19/2019, 10:57 AM

## 2019-09-20 ENCOUNTER — Other Ambulatory Visit: Payer: Self-pay | Admitting: *Deleted

## 2019-09-20 DIAGNOSIS — D649 Anemia, unspecified: Secondary | ICD-10-CM

## 2019-09-20 DIAGNOSIS — D693 Immune thrombocytopenic purpura: Secondary | ICD-10-CM

## 2019-09-20 DIAGNOSIS — Z298 Encounter for other specified prophylactic measures: Secondary | ICD-10-CM

## 2019-09-20 DIAGNOSIS — C9110 Chronic lymphocytic leukemia of B-cell type not having achieved remission: Secondary | ICD-10-CM

## 2019-09-20 LAB — BASIC METABOLIC PANEL
Anion gap: 10 (ref 5–15)
BUN: 23 mg/dL (ref 8–23)
CO2: 19 mmol/L — ABNORMAL LOW (ref 22–32)
Calcium: 8.1 mg/dL — ABNORMAL LOW (ref 8.9–10.3)
Chloride: 109 mmol/L (ref 98–111)
Creatinine, Ser: 0.63 mg/dL (ref 0.44–1.00)
GFR calc Af Amer: >60 mL/min (ref >60)
GFR calc non Af Amer: >60 mL/min (ref >60)
Glucose, Bld: 108 mg/dL — ABNORMAL HIGH (ref 70–99)
Potassium: 3.7 mmol/L (ref 3.5–5.1)
Sodium: 138 mmol/L (ref 135–145)

## 2019-09-20 LAB — CBC WITH DIFFERENTIAL/PLATELET
Abs Immature Granulocytes: 0.09 10*3/uL — ABNORMAL HIGH (ref 0.00–0.07)
Basophils Absolute: 0 10*3/uL (ref 0.0–0.1)
Basophils Relative: 0 %
Eosinophils Absolute: 0 10*3/uL (ref 0.0–0.5)
Eosinophils Relative: 0 %
HCT: 30.4 % — ABNORMAL LOW (ref 36.0–46.0)
Hemoglobin: 10 g/dL — ABNORMAL LOW (ref 12.0–15.0)
Immature Granulocytes: 1 %
Lymphocytes Relative: 61 %
Lymphs Abs: 9.2 10*3/uL — ABNORMAL HIGH (ref 0.7–4.0)
MCH: 30.3 pg (ref 26.0–34.0)
MCHC: 32.9 g/dL (ref 30.0–36.0)
MCV: 92.1 fL (ref 80.0–100.0)
Monocytes Absolute: 0.5 10*3/uL (ref 0.1–1.0)
Monocytes Relative: 4 %
Neutro Abs: 5 10*3/uL (ref 1.7–7.7)
Neutrophils Relative %: 34 %
Platelets: 110 10*3/uL — ABNORMAL LOW (ref 150–400)
RBC: 3.3 MIL/uL — ABNORMAL LOW (ref 3.87–5.11)
RDW: 15.9 % — ABNORMAL HIGH (ref 11.5–15.5)
WBC: 14.8 10*3/uL — ABNORMAL HIGH (ref 4.0–10.5)
nRBC: 0.1 % (ref 0.0–0.2)

## 2019-09-20 LAB — HEPATITIS B DNA, ULTRAQUANTITATIVE, PCR
HBV DNA SERPL PCR-ACNC: NOT DETECTED IU/mL
HBV DNA SERPL PCR-LOG IU: UNDETERMINED log10 IU/mL

## 2019-09-20 LAB — HCV RNA QUANT: HCV Quantitative: NOT DETECTED IU/mL (ref >50)

## 2019-09-20 LAB — PHOSPHORUS: Phosphorus: 2.8 mg/dL (ref 2.5–4.6)

## 2019-09-20 LAB — MAGNESIUM: Magnesium: 2.1 mg/dL (ref 1.7–2.4)

## 2019-09-20 MED ORDER — AMLODIPINE BESYLATE 10 MG PO TABS: 10.0000 mg | ORAL_TABLET | Freq: Every day | ORAL | 0 refills | Status: AC

## 2019-09-20 MED ORDER — PREDNISONE 50 MG PO TABS: 50.0000 mg | ORAL_TABLET | Freq: Every day | ORAL | 0 refills | Status: DC

## 2019-09-20 NOTE — Progress Notes (Signed)
Patient was stable at discharge. I removed her IVs. We reviewed the discharge education. Patient and daughter verbalized understanding and had no further questions. Patient left with belongings in hand. Patient aware that case manager will follow up with her via phone today to set up outpatient occupational therapy. Patient and daughter aware of medication changes and follow up appointment needs.

## 2019-09-20 NOTE — TOC Transition Note (Signed)
Transition of Care [facility]) - CM/SW Discharge Note   Patient Details  Name: Selena Branch MRN: [id number] Date of Birth: [birth year]  Transition of Care [facility]) CM/SW Contact:  Sharin Mons, RN Phone Number: 09/20/2019, 10:37 AM   Clinical Narrative:    Pt will transition to home today. NCM did received consult : Outpatient OT. NCM spoke with pt via phone. Pt agreeable to outpatient pt. Referral made [facility] and noted on AVS. Pt states has transportation to outpatient center. Pt 's # [phone]  Husband to provide transportation to home .   Final next level of care: Home/Self Care Barriers to Discharge: No Barriers Identified   Patient Goals and CMS Choice        Discharge Placement                       Discharge Plan and Services                                     Social Determinants of Health (SDOH) Interventions     Readmission Risk Interventions No flowsheet data found.

## 2019-09-20 NOTE — Discharge Summary (Signed)
Physician Discharge Summary  Selena Branch [id number] DOB: December 18, 1940 DOA: 09/15/2019  PCP: [name], MD  Admit date: 09/15/2019 Discharge date: 09/20/2019  Time spent: 45 minutes  Recommendations for Outpatient Follow-up:  Patient will be discharged to home with outpatient occupational therapy.  Patient will need to follow up with primary care provider within one week of discharge and discuss blood pressure management. Follow up with Dr. Irene Limbo, oncologist, in one week. Patient should continue medications as prescribed.  Patient should follow a heart healthy diet.   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 Calvert Cantor Gurleyis a 79 y.o.femalewith medical history significant ofHTN; HLD; CLL; and glaucoma presenting with bleeding of gums and nose, petechiae.She was diagnosed with CLL in March and was going into to the [facility] for ongoing monitoring every 3 months. She was due to go Monday to develop a treatment plan and things went crazy on Monday. Patient was last admitted from 12/17-19 with thrombocytopenia associated with CLL; oncology was consulted and she was treated with Dexamethasone and IVIG. Platelets were 63 at the time of d/c and she was recommended to have a BM biopsy the following week. 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 Clearly, her blood did not like the platelets and she had to come back. She was given platelets Tuesday noticed bleeding from the gums and nose  Thursday-Friday and then developed diffuse petechiae. Things were worse this AM and she "realized I really had an issue." Her prior hospitalization kept her without symptoms through 12/29. No falls. No headaches.  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 Irene Limbo, hem/onc, patient has received 2 doses of IVIG, high-dose. Goal for platelets >30K. Zoloft discontinued. 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  Second 3.5 mm hypodense focus affecting the gyral surface on the right frontoparietal junction.  Presence of the second lesion elevates the likelihood of metastatic disease. -Neurology consulted and appreciated -Patient was placed on Cleviprex drip -repeat CT head today: Size stable sites of cerebral hemorrhage.  No new abnormality or significant mass-effect. -Discussed with Dr. Leonie Man, patient is stable from a neurology standpoint -PT recommended no further therapy needs -OT recommended outpatient OT  Hyponateremia -Sodium was 131, is up to 138 today -Repeat in one wek  Chronic normocytic anemia -hemoglobin was 11 on admission -Suspect drop was dilutional component along with the above -patient was given 1u PRBCs on 09/17/2018 -Hemoglobin currently 10 -Repeat CBC with diff in one week  Hypokalemia -Resolved with replacement -magnesium 2.1  Leukocytosis -Suspect reactive as well as steroid use  Hyperlipidemia -continue statin  Essential  hypertension -Continue amlodipine, lisinopril  Code status: DNR  Consultants Neurology Hematology/oncology PCCM  Procedures  None  Discharge Exam: Vitals:   09/20/19 0400 09/20/19 0849  BP: 140/60 (!) 188/76  Pulse: 68   Resp: 17   Temp: 98 F (36.7 C) 97.7 F (36.5 C)  SpO2: 99%      General: Well developed, well nourished, NAD, appears stated age  HEENT: NCAT, mucous membranes moist.  Neck: Supple, no JVD, no masses  Cardiovascular: S1 S2 auscultated, RRR  Respiratory: Clear to auscultation bilaterally with equal chest rise  Abdomen: Soft, nontender, nondistended, + bowel sounds  Extremities: warm dry without cyanosis clubbing or edema  Neuro: AAOx3, nonfocal  Psych: Appropriate mood and affect, pleasant   Discharge Instructions Discharge Instructions    Care order/instruction   Complete by: As directed    Transfuse Parameters   Care order/instruction   Complete by: As directed    Transfuse Parameters   Complete patient signature process for consent form   Complete by: As directed    Complete patient signature process for consent form   Complete by: As directed    Discharge instructions   Complete by: As directed    Patient will be discharged to home with outpatient occupational therapy.  Patient will need to follow up with primary care provider within one week of discharge and discuss blood pressure management. Follow up with Dr. Irene Limbo, oncologist, in one week. Patient should continue medications as prescribed.  Patient should follow a regular diet   Practitioner attestation of consent   Complete by: As directed    I, the ordering practitioner, attest that I have discussed with the patient the benefits, risks, side effects, alternatives, likelihood of achieving goals and potential problems during recovery for the procedure listed.   Procedure: Blood Product(s)   Practitioner attestation of consent   Complete by: As directed    I, the ordering  practitioner, attest that I have discussed with the patient the benefits, risks, side effects, alternatives, likelihood of achieving goals and potential problems during recovery for the procedure listed.   Procedure: Blood Product(s)   Type and screen   Complete by: As directed    [place]    Type and screen   Complete by: As directed    [place]      Allergies as of 09/20/2019      Reactions   Codeine Nausea Only      Medication List    TAKE these medications   amLODipine 10 MG tablet Commonly known as: NORVASC Take 1 tablet (10 mg total) by mouth at bedtime. What changed:   medication strength  how much to take   atorvastatin 40 MG tablet Commonly known as: LIPITOR Take 40 mg by mouth daily.   CALCIUM-D PO Take 1 tablet by mouth daily.   dorzolamide-timolol 22.3-6.8 MG/ML ophthalmic solution Commonly known as: COSOPT Place 1 drop into both eyes daily.   furosemide 20 MG tablet Commonly known as: LASIX Take 40 mg for 3 days then resume your 20 mg daily What changed:   how much to take  how to take this  when to take this  additional instructions   latanoprost 0.005 % ophthalmic solution Commonly known as: XALATAN Place 1 drop into both eyes at bedtime.   multivitamin with minerals Tabs tablet Take 1 tablet by mouth daily.   predniSONE 50 MG tablet Commonly known as: DELTASONE Take 1 tablet (50 mg total)  by mouth daily with breakfast. Start taking on: September 21, 2019   quinapril 40 MG tablet Commonly known as: ACCUPRIL Take 40 mg by mouth daily.   sertraline 25 MG tablet Commonly known as: ZOLOFT Take 25 mg by mouth daily.   Simbrinza 1-0.2 % Susp Generic drug: Brinzolamide-Brimonidine Place 1 drop into both eyes daily at 12 noon.   Vitamin D3 50 MCG (2000 UT) Tabs Take 2,000 Units by mouth daily.      Allergies  Allergen Reactions  . Codeine Nausea Only   Follow-up Information    [name], MD. Schedule an appointment as soon as possible for a visit in 1 week(s).   Specialties: Hematology, Oncology Why: Hospital follow up Contact information: [place] [address] [id number]        Garvin Fila, MD. Schedule an appointment as soon as possible for a visit in 4 week(s).   Specialties: Neurology, Radiology Why: Hospital follow up Contact information: [address] [place] [address] [phone]        [name], MD. Schedule an appointment as soon as possible for a visit in 1 week(s).   Specialty: Family Medicine Why: Hospital follow up Contact information: [place] [address] [phone]            The results of significant diagnostics from this hospitalization (including imaging, microbiology, ancillary and laboratory) are listed below for reference.    Significant Diagnostic Studies: CT HEAD WO CONTRAST  Result Date: 09/18/2019 CLINICAL DATA:  Stroke follow-up. EXAM: CT HEAD WITHOUT CONTRAST TECHNIQUE: Contiguous axial images were obtained from the base of the skull through the vertex without intravenous contrast. COMPARISON:  Two days ago FINDINGS: Brain: 20 mm hematoma in the superior and anterior left temporal subcortical lobe with rim of edema. 3 mm hyperdensity along the right posterior frontal cortex. No interval change. No visible infarct, hydrocephalus, or midline shift. Vascular: No hyperdense vessel or unexpected calcification. Skull: Normal. Negative for fracture or focal lesion. Sinuses/Orbits: Unremarkable IMPRESSION: Size stable sites of cerebral hemorrhage. No new abnormality or significant mass effect. Electronically Signed   By: Monte Fantasia M.D.   On: 09/18/2019 04:35   CT HEAD WO CONTRAST  Result Date: 09/16/2019 CLINICAL DATA:  Follow-up intracranial hemorrhage. EXAM: CT HEAD WITHOUT CONTRAST TECHNIQUE: Contiguous axial images were obtained from the base  of the skull through the vertex without intravenous contrast. COMPARISON:  Earlier same day.  08/09/2018. FINDINGS: Brain: No change since earlier today. Maximal 21 mm in diameter intraparenchymal hemorrhage in the lateral left temporal lobe with vasogenic edema and a small amount of adjacent subarachnoid blood. Second 3.5 mm hyperdense focus present along the gyral surface of the right frontal parietal junction, axial image 17, also unchanged. Considerable concern for hemorrhagic metastases in this case. No evidence of midline shift. No sign of typical ischemic infarction. No hydrocephalus or extra-axial collection. Vascular: There is atherosclerotic calcification of the major vessels at the base of the brain. Skull: Negative Sinuses/Orbits: Clear/normal Other: None IMPRESSION: No change since earlier today. 21 mm hemorrhage or hemorrhagic lesion in the lateral left temporal lobe with surrounding vasogenic edema and a small amount of adjacent subarachnoid hemorrhage. Second 3.5 mm hyperdense focus affecting the gyral surface in the right frontoparietal junction. Presence of this second lesion elevates the likelihood of metastatic disease. This would be an unusual primary hemorrhage. Electronically Signed   By: Nelson Chimes M.D.   On: 09/16/2019 16:50  CT CHEST W CONTRAST  Result Date: 08/31/2019 CLINICAL DATA:  Inpatient. CLL, presenting for staging. New anemia and severe thrombocytopenia. EXAM: CT CHEST, ABDOMEN, AND PELVIS WITH CONTRAST TECHNIQUE: Multidetector CT imaging of the chest, abdomen and pelvis was performed following the standard protocol during bolus administration of intravenous contrast. CONTRAST:  120mL OMNIPAQUE IOHEXOL 300 MG/ML  SOLN COMPARISON:  05/07/2010 chest CT angiogram. FINDINGS: CT CHEST FINDINGS Cardiovascular: Normal heart size. No significant pericardial effusion/thickening. Left anterior descending coronary atherosclerosis. Atherosclerotic nonaneurysmal thoracic aorta. Normal  caliber pulmonary arteries. No central pulmonary emboli. Mediastinum/Nodes: No discrete thyroid nodules. Unremarkable esophagus. No pathologically enlarged axillary, mediastinal or hilar lymph nodes. Lungs/Pleura: No pneumothorax. No pleural effusion. No acute consolidative airspace disease or lung masses. A few scattered perifissural and subpleural solid pulmonary nodules in both lungs measuring up to 5 mm in the anterior right lower lobe (series 5/image 75), all stable since 05/07/2010 chest CT, considered benign. No new significant pulmonary nodules. Mild platelike scarring versus atelectasis at the dependent lung bases bilaterally. Musculoskeletal: No aggressive appearing focal osseous lesions. Mild thoracic spondylosis. Chronic mild posterior T10 vertebral compression deformity. CT ABDOMEN PELVIS FINDINGS Hepatobiliary: Normal liver with no liver mass. Normal gallbladder with no radiopaque cholelithiasis. No biliary ductal dilatation. Small periampullary duodenal diverticulum. Pancreas: Normal, with no mass or duct dilation. Spleen: Normal size. No mass. Adrenals/Urinary Tract: Normal adrenals. Normal kidneys with no hydronephrosis and no renal mass. Normal bladder. Stomach/Bowel: Small hiatal hernia. Otherwise normal nondistended stomach. Normal caliber small bowel with no small bowel wall thickening. Normal appendix. Oral contrast transits to the left colon. Moderate sigmoid diverticulosis, with no large bowel wall thickening or significant pericolonic fat stranding. Vascular/Lymphatic: Atherosclerotic nonaneurysmal abdominal aorta. Patent portal, splenic, hepatic and renal veins. Mildly enlarged 1.0 cm right inguinal node (series 3/image 105). Mild bilateral external iliac adenopathy up to 1.1 cm on the right (series 3/image 97) and 1.0 cm on the left (series 3/image 99). Mildly enlarged 1.2 cm left common iliac node (series 3/image 82). Left para-aortic adenopathy up to 1.2 cm (series 3/image 70).  Aortocaval adenopathy up to 1.1 cm (series 3/image 73). Reproductive: Normal uterus. No right adnexal mass. Simple 5.3 cm left adnexal cyst (series 3/image 94). Other: No pneumoperitoneum, ascites or focal fluid collection. Musculoskeletal: No aggressive appearing focal osseous lesions. Moderate lower lumbar degenerative changes. IMPRESSION: 1. Mild multistation lymphadenopathy in the retroperitoneum and bilateral pelvis as detailed, compatible with lymphoma. Normal size spleen. No thoracic adenopathy. 2. One vessel coronary atherosclerosis. 3. Moderate sigmoid diverticulosis. 4. Simple 5.3 cm left adnexal cyst. Pelvic ultrasound evaluation recommended. This recommendation follows ACR consensus guidelines: White Paper of the ACR Incidental Findings Committee II on Adnexal Findings. J Am Coll Radiol 2013:10:675-681. 5.  Aortic Atherosclerosis (ICD10-I70.0). Electronically Signed   By: Ilona Sorrel M.D.   On: 08/31/2019 12:14   CT ABDOMEN PELVIS W CONTRAST  Result Date: 08/31/2019 CLINICAL DATA:  Inpatient. CLL, presenting for staging. New anemia and severe thrombocytopenia. EXAM: CT CHEST, ABDOMEN, AND PELVIS WITH CONTRAST TECHNIQUE: Multidetector CT imaging of the chest, abdomen and pelvis was performed following the standard protocol during bolus administration of intravenous contrast. CONTRAST:  171mL OMNIPAQUE IOHEXOL 300 MG/ML  SOLN COMPARISON:  05/07/2010 chest CT angiogram. FINDINGS: CT CHEST FINDINGS Cardiovascular: Normal heart size. No significant pericardial effusion/thickening. Left anterior descending coronary atherosclerosis. Atherosclerotic nonaneurysmal thoracic aorta. Normal caliber pulmonary arteries. No central pulmonary emboli. Mediastinum/Nodes: No discrete thyroid nodules. Unremarkable esophagus. No pathologically enlarged axillary, mediastinal or hilar lymph nodes. Lungs/Pleura: No  pneumothorax. No pleural effusion. No acute consolidative airspace disease or lung masses. A few scattered  perifissural and subpleural solid pulmonary nodules in both lungs measuring up to 5 mm in the anterior right lower lobe (series 5/image 75), all stable since 05/07/2010 chest CT, considered benign. No new significant pulmonary nodules. Mild platelike scarring versus atelectasis at the dependent lung bases bilaterally. Musculoskeletal: No aggressive appearing focal osseous lesions. Mild thoracic spondylosis. Chronic mild posterior T10 vertebral compression deformity. CT ABDOMEN PELVIS FINDINGS Hepatobiliary: Normal liver with no liver mass. Normal gallbladder with no radiopaque cholelithiasis. No biliary ductal dilatation. Small periampullary duodenal diverticulum. Pancreas: Normal, with no mass or duct dilation. Spleen: Normal size. No mass. Adrenals/Urinary Tract: Normal adrenals. Normal kidneys with no hydronephrosis and no renal mass. Normal bladder. Stomach/Bowel: Small hiatal hernia. Otherwise normal nondistended stomach. Normal caliber small bowel with no small bowel wall thickening. Normal appendix. Oral contrast transits to the left colon. Moderate sigmoid diverticulosis, with no large bowel wall thickening or significant pericolonic fat stranding. Vascular/Lymphatic: Atherosclerotic nonaneurysmal abdominal aorta. Patent portal, splenic, hepatic and renal veins. Mildly enlarged 1.0 cm right inguinal node (series 3/image 105). Mild bilateral external iliac adenopathy up to 1.1 cm on the right (series 3/image 97) and 1.0 cm on the left (series 3/image 99). Mildly enlarged 1.2 cm left common iliac node (series 3/image 82). Left para-aortic adenopathy up to 1.2 cm (series 3/image 70). Aortocaval adenopathy up to 1.1 cm (series 3/image 73). Reproductive: Normal uterus. No right adnexal mass. Simple 5.3 cm left adnexal cyst (series 3/image 94). Other: No pneumoperitoneum, ascites or focal fluid collection. Musculoskeletal: No aggressive appearing focal osseous lesions. Moderate lower lumbar degenerative changes.  IMPRESSION: 1. Mild multistation lymphadenopathy in the retroperitoneum and bilateral pelvis as detailed, compatible with lymphoma. Normal size spleen. No thoracic adenopathy. 2. One vessel coronary atherosclerosis. 3. Moderate sigmoid diverticulosis. 4. Simple 5.3 cm left adnexal cyst. Pelvic ultrasound evaluation recommended. This recommendation follows ACR consensus guidelines: White Paper of the ACR Incidental Findings Committee II on Adnexal Findings. J Am Coll Radiol 2013:10:675-681. 5.  Aortic Atherosclerosis (ICD10-I70.0). Electronically Signed   By: Ilona Sorrel M.D.   On: 08/31/2019 12:14   CT HEAD CODE STROKE WO CONTRAST  Result Date: 09/16/2019 CLINICAL DATA:  Code stroke. Neuro deficit, acute, stroke suspected. Additional history provided: Last known normal 9:50 AM, slurred speech and confusion EXAM: CT HEAD WITHOUT CONTRAST TECHNIQUE: Contiguous axial images were obtained from the base of the skull through the vertex without intravenous contrast. COMPARISON:  CT head 08/09/2018, brain MRI 01/14/2016 FINDINGS: Brain: There is an acute parenchymal hemorrhage centered within the left temporal lobe with mild surrounding edema. The hemorrhage measures 1.6 x 1.6 x 2.0 cm. Trace surrounding subarachnoid hemorrhage. Minimal regional mass effect. No effacement of the ventricular system or midline shift. Additional 3 mm hyperdense focus within the cortex of the right parietal lobe (series 6, image 11) (series 3, image 17). No extra-axial fluid collection. Mild ill-defined hypoattenuation within the cerebral white matter is nonspecific, but consistent with chronic small vessel ischemic disease. Mild generalized parenchymal atrophy. Vascular: No hyperdense vessel.  Atherosclerotic calcifications. Skull: Normal. Negative for fracture or focal lesion. Sinuses/Orbits: Visualized orbits demonstrate no acute abnormality. Other: No significant paranasal sinus disease or mastoid effusion at the imaged levels. These  results were called by telephone at the time of interpretation on 09/16/2019 at 10:32 am to provider Dr. Rory Percy, who verbally acknowledged these results. IMPRESSION: 2 cm left temporal lobe parenchymal hemorrhage with mild surrounding edema.  Trace surrounding subarachnoid hemorrhage. Contrast-enhanced brain MRI is recommended once the hematoma involutes to assess for an underlying lesion. Additional 3 mm hyperdense focus within the right parietal cortex. This may reflect an additional tiny parenchymal hemorrhage or other hyperdense lesion. This too should have MRI follow-up. Generalized parenchymal atrophy and chronic small vessel ischemic disease. Electronically Signed   By: Kellie Simmering DO   On: 09/16/2019 10:34   VAS [place] LOWER EXTREMITY VENOUS (DVT)  Result Date: 09/01/2019  Lower Venous Study Indications: Edema.  Comparison Study: no prior Performing Technologist: June Leap RDMS, RVT  Examination Guidelines: A complete evaluation includes B-mode imaging, spectral Doppler, color Doppler, and power Doppler as needed of all accessible portions of each vessel. Bilateral testing is considered an integral part of a complete examination. Limited examinations for reoccurring indications may be performed as noted.  +-----+---------------+---------+-----------+----------+--------------+ RIGHTCompressibilityPhasicitySpontaneityPropertiesThrombus Aging +-----+---------------+---------+-----------+----------+--------------+ CFV  Full           Yes      Yes                                 +-----+---------------+---------+-----------+----------+--------------+   +---------+---------------+---------+-----------+----------+--------------+ LEFT     CompressibilityPhasicitySpontaneityPropertiesThrombus Aging +---------+---------------+---------+-----------+----------+--------------+ CFV      Full           Yes      Yes                                  +---------+---------------+---------+-----------+----------+--------------+ SFJ      Full                                                        +---------+---------------+---------+-----------+----------+--------------+ FV Prox  Full                                                        +---------+---------------+---------+-----------+----------+--------------+ FV Mid   Full                                                        +---------+---------------+---------+-----------+----------+--------------+ FV DistalFull                                                        +---------+---------------+---------+-----------+----------+--------------+ PFV      Full                                                        +---------+---------------+---------+-----------+----------+--------------+ POP      Full           Yes  Yes                                 +---------+---------------+---------+-----------+----------+--------------+ PTV      Full                                                        +---------+---------------+---------+-----------+----------+--------------+ PERO     Full                                                        +---------+---------------+---------+-----------+----------+--------------+     Summary: Right: No evidence of common femoral vein obstruction. Left: There is no evidence of deep vein thrombosis in the lower extremity. No cystic structure found in the popliteal fossa.  *See table(s) above for measurements and observations. Electronically signed by Harold Barban MD on 09/01/2019 at 4:38:59 PM.    Final     Microbiology: Recent Results (from the past 240 hour(s))  Respiratory Panel by RT PCR (Flu A&B, Covid) - Nasopharyngeal Swab     Status: None   Collection Time: 09/15/19  3:40 PM   Specimen: Nasopharyngeal Swab  Result Value Ref Range Status   SARS Coronavirus 2 by RT PCR NEGATIVE NEGATIVE Final    Comment:  (NOTE) SARS-CoV-2 target nucleic acids are NOT DETECTED. The SARS-CoV-2 RNA is generally detectable in upper respiratoy specimens during the acute phase of infection. The lowest concentration of SARS-CoV-2 viral copies this assay can detect is 131 copies/mL. A negative result does not preclude SARS-Cov-2 infection and should not be used as the sole basis for treatment or other patient management decisions. A negative result may occur with  improper specimen collection/handling, submission of specimen other than nasopharyngeal swab, presence of viral mutation(s) within the areas targeted by this assay, and inadequate number of viral copies (<131 copies/mL). A negative result must be combined with clinical observations, patient history, and epidemiological information. The expected result is Negative. Fact Sheet for Patients:  PinkCheek.be Fact Sheet for Healthcare Providers:  GravelBags.it This test is not yet ap proved or cleared by the [place] FDA and  has been authorized for detection and/or diagnosis of SARS-CoV-2 by FDA under an Emergency Use Authorization (EUA). This EUA will remain  in effect (meaning this test can be used) for the duration of the COVID-19 declaration under Section 564(b)(1) of the Act, 21 U.S.C. section 360bbb-3(b)(1), unless the authorization is terminated or revoked sooner.    Influenza A by PCR NEGATIVE NEGATIVE Final   Influenza B by PCR NEGATIVE NEGATIVE Final    Comment: (NOTE) The Xpert Xpress SARS-CoV-2/FLU/RSV assay is intended as an aid in  the diagnosis of influenza from Nasopharyngeal swab specimens and  should not be used as a sole basis for treatment. Nasal washings and  aspirates are unacceptable for Xpert Xpress SARS-CoV-2/FLU/RSV  testing. Fact Sheet for Patients: PinkCheek.be Fact Sheet for Healthcare  Providers: GravelBags.it This test is not yet approved or cleared by the [place] FDA and  has been authorized for detection and/or diagnosis of SARS-CoV-2 by  FDA under an Emergency Use Authorization (EUA). This EUA will remain  in effect (meaning this test can be used) for the duration of the  Covid-19 declaration under Section 564(b)(1) of the Act, 21  U.S.C. section 360bbb-3(b)(1), unless the authorization is  terminated or revoked. Performed at [facility], [place] [address] [place], [place] [address]   MRSA PCR Screening     Status: None   Collection Time: 09/16/19  1:14 PM   Specimen: Nasal Mucosa; Nasopharyngeal  Result Value Ref Range Status   MRSA by PCR NEGATIVE NEGATIVE Final    Comment:        The GeneXpert MRSA Assay (FDA approved for NASAL specimens only), is one component of a comprehensive MRSA colonization surveillance program. It is not intended to diagnose MRSA infection nor to guide or monitor treatment for MRSA infections. Performed at [facility], [place] [address] [place], [place] [address]      Labs: Basic Metabolic Panel: Recent Labs  Lab 09/16/19 0133 09/17/19 0131 09/18/19 0245 09/19/19 0332 09/20/19 0415  NA 138 131* 133* 137 138  K 4.2 3.7 3.1* 4.0 3.7  CL 108 104 107 111 109  CO2 22 21* 19* 21* 19*  GLUCOSE 126* 118* 109* 118* 108*  BUN 17 18 22 19 23   CREATININE 0.64 0.77 0.70 0.68 0.63  CALCIUM 9.0 7.9* 7.9* 8.4* 8.1*  MG  --  1.8 1.9 2.1 2.1  PHOS  --  3.2 3.0 3.3 2.8   Liver Function Tests: Recent Labs  Lab 09/15/19 0904 09/17/19 0131 09/19/19 0332  AST 35 31 31  ALT 22 15 24   ALKPHOS 53 43 49  BILITOT 0.8 0.6 0.9  PROT 6.7 7.9 9.8*  ALBUMIN 3.5 2.8* 2.6*   No results for input(s): LIPASE, AMYLASE in the last 168 hours. No results for input(s): AMMONIA in the last 168 hours. CBC: Recent Labs  Lab 09/17/19 0131 09/17/19 1549 09/18/19 0245 09/19/19 0332 09/20/19 0415   WBC 18.5* 18.6* 15.9* 16.6* 14.8*  NEUTROABS 5.0 11.3* 4.2 4.9 5.0  HGB 8.8* 8.5* 7.3* 10.6* 10.0*  HCT 26.6* 25.5* 22.3* 31.1* 30.4*  MCV 95.0 93.1 94.5 91.7 92.1  PLT 27* 62* 47* 67* 110*   Cardiac Enzymes: No results for input(s): CKTOTAL, CKMB, CKMBINDEX, TROPONINI in the last 168 hours. BNP: BNP (last 3 results) No results for input(s): BNP in the last 8760 hours.  ProBNP (last 3 results) No results for input(s): PROBNP in the last 8760 hours.  CBG: Recent Labs  Lab 09/16/19 1009  GLUCAP 110*       Signed:  Yides Saidi  [facility] 09/20/2019, 9:23 AM

## 2019-09-20 NOTE — Plan of Care (Signed)
Pt's care plan needs met. She is adequate for discharge.

## 2019-09-21 ENCOUNTER — Telehealth: Payer: Self-pay | Admitting: Hematology

## 2019-09-21 NOTE — Telephone Encounter (Signed)
Scheduled appt per 1/7 sch message - pt is aware of appt date and time   

## 2019-09-27 NOTE — Progress Notes (Signed)
HEMATOLOGY/ONCOLOGY CONSULTATION NOTE  Date of Service: 09/28/2019  Patient Care Team: [name], MD as PCP - General (Family Medicine) Barbaraann Faster, RN as [facility]  CHIEF COMPLAINTS/PURPOSE OF CONSULTATION:  CLL  HISTORY OF PRESENTING ILLNESS:   Selena Branch is a wonderful 79 y.o. female who has been transferred to [place] from Dr. Mathis Dad Higgs for evaluation and management of CLL. The pt reports that she is doing well overall.  The pt reports that has been slightly fatigued in the past 1-2 months, but thinks it is from the heat. The fatigue does not affect her ability to function. Denies fevers, chills, and night sweats. She reports that she saw her PCP in August, who noticed that her blood counts were high and referred her to Dr. Irene Limbo.  Lab results today (05/31/2019) of CBC w diff and CMP is as follows: all values are WNL except for WBC at 39.1k, neutro abs at 1.6k, lymphs abs at 36.8k, monocytes abs at 0.0k, eosinophils abs at 0.8k, BUN at 27, Total protein at 6.4 05/31/2019 LDH is pending  On review of systems, pt reports mild fatigue and denies belly pain, infection issues and any other symptoms.   INTERVAL HISTORY:  Selena Branch is a 79 y.o. 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 She is here for C2 of Rituxan. The patient's last visit with [place] was on 08/30/2019. The pt reports that she is doing well overall.  The pt reports that she has been feeling well but is concerned about her BP.  Pt has continued taking 50 mg of Prednisone per day directly after breakfast. The bruising on her leg is disappearing. She denies any issues with her first dose of Rituxan.   Lab results today (09/28/19) of CBC w/diff and CMP is as follows: all values are WNL except for WBC at 20.8K, Glucose at 126, Calcium at 8.7, Total Protein at 8.6, Albumin at 3.4.  09/28/2019 LDH at 185  On review of systems, pt reports improved leg bruising and denies  headaches, leg swelling, vision changes, abdominal pain and any other symptoms.   MEDICAL HISTORY:  Past Medical History:  Diagnosis Date  . Anxiety   . 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 Glaucoma   . Hyperlipemia   . Hypertension   . Osteoporosis     SURGICAL HISTORY: No past surgical history on file.  SOCIAL HISTORY: Social History   Socioeconomic History  . Marital status: Married    Spouse name: Not on file  . Number of children: 1  . Years of education: HS  . Highest education level: Not on file  Occupational History  . Occupation: Retired  Tobacco Use  . Smoking status: Never Smoker  . Smokeless tobacco: Never Used  Substance and Sexual Activity  . Alcohol use: No    Alcohol/week: 0.0 standard drinks  . Drug use: No  . Sexual activity: Not on file  Other Topics Concern  . Not on file  Social History Narrative   Drinks 2 cups of coffee   Social Determinants of Health   Financial Resource Strain:   . Difficulty of Paying Living Expenses: Not on file  Food Insecurity:   . Worried About Charity fundraiser in the Last Year: Not on file  . Ran Out of Food in the Last Year: Not on file  Transportation Needs:   . Lack of Transportation (Medical): Not on file  . Lack of  Transportation (Non-Medical): Not on file  Physical Activity:   . Days of Exercise per Week: Not on file  . Minutes of Exercise per Session: Not on file  Stress:   . Feeling of Stress : Not on file  Social Connections:   . Frequency of Communication with Friends and Family: Not on file  . Frequency of Social Gatherings with Friends and Family: Not on file  . Attends Religious Services: Not on file  . Active Member of Clubs or Organizations: Not on file  . Attends Archivist Meetings: Not on file  . Marital Status: Not on file  Intimate Partner Violence:   . Fear of Current or Ex-Partner: Not on file  . Emotionally Abused: Not on file  . Physically Abused: Not on  file  . Sexually Abused: Not on file    FAMILY HISTORY: Family History  Problem Relation Age of Onset  . Stroke Mother   . Lung cancer Father   . Leukemia Neg Hx     ALLERGIES:  is allergic to codeine.  MEDICATIONS:  Current Outpatient Medications  Medication Sig Dispense Refill  . amLODipine (NORVASC) 10 MG tablet Take 1 tablet (10 mg total) by mouth at bedtime. 30 tablet 0  . atorvastatin (LIPITOR) 40 MG tablet Take 40 mg by mouth daily.     . Brinzolamide-Brimonidine (SIMBRINZA) 1-0.2 % SUSP Place 1 drop into both eyes daily at 12 noon.    . Calcium Carbonate-Vitamin D (CALCIUM-D PO) Take 1 tablet by mouth daily.    . Cholecalciferol (VITAMIN D3) 2000 units TABS Take 2,000 Units by mouth daily.     . dorzolamide-timolol (COSOPT) 22.3-6.8 MG/ML ophthalmic solution Place 1 drop into both eyes daily.     . furosemide (LASIX) 20 MG tablet Take 40 mg for 3 days then resume your 20 mg daily (Patient taking differently: Take 20 mg by mouth daily. ) 30 tablet   . latanoprost (XALATAN) 0.005 % ophthalmic solution Place 1 drop into both eyes at bedtime.     . Multiple Vitamin (MULTIVITAMIN WITH MINERALS) TABS tablet Take 1 tablet by mouth daily.    . predniSONE (DELTASONE) 50 MG tablet Take 1 tablet (50 mg total) by mouth daily with breakfast. 14 tablet 0  . quinapril (ACCUPRIL) 40 MG tablet Take 40 mg by mouth daily.     . sertraline (ZOLOFT) 25 MG tablet Take 25 mg by mouth daily.      No current facility-administered medications for this visit.    REVIEW OF SYSTEMS:   A 10+ POINT REVIEW OF SYSTEMS WAS OBTAINED including neurology, dermatology, psychiatry, cardiac, respiratory, lymph, extremities, GI, GU, Musculoskeletal, constitutional, breasts, reproductive, HEENT.  All pertinent positives are noted in the HPI.  All others are negative.   PHYSICAL EXAMINATION: ECOG FS:2 - Symptomatic, <50% confined to bed  Vitals:   09/28/19 1014  BP: (!) 162/90  Pulse: 75  Resp: 17  Temp:  98.5 F (36.9 C)  SpO2: 98%   Wt Readings from Last 3 Encounters:  09/28/19 104 lb 6.4 oz (47.4 kg)  09/19/19 114 lb 6.7 oz (51.9 kg)  09/11/19 105 lb 14.4 oz (48 kg)   Body mass index is 21.82 kg/m.    Exam was given in a chair   GENERAL:alert, in no acute distress and comfortable SKIN: no acute rashes, no significant lesions EYES: conjunctiva are pink and non-injected, sclera anicteric OROPHARYNX: MMM, no exudates, no oropharyngeal erythema or ulceration NECK: supple, no JVD LYMPH:  no  palpable lymphadenopathy in the cervical, axillary or inguinal regions LUNGS: clear to auscultation b/l with normal respiratory effort HEART: regular rate & rhythm ABDOMEN:  normoactive bowel sounds , non tender, not distended. No palpable hepatosplenomegaly.  Extremity: no pedal edema PSYCH: alert & oriented x 3 with fluent speech NEURO: no focal motor/sensory deficits  LABORATORY DATA:  I have reviewed the data as listed  . CBC Latest Ref Rng & Units 09/28/2019 09/20/2019 09/19/2019  WBC 4.0 - 10.5 K/uL 20.8(H) 14.8(H) 16.6(H)  Hemoglobin 12.0 - 15.0 g/dL 13.2 10.0(L) 10.6(L)  Hematocrit 36.0 - 46.0 % 40.8 30.4(L) 31.1(L)  Platelets 150 - 400 K/uL 151 110(L) 67(L)    . CMP Latest Ref Rng & Units 09/28/2019 09/20/2019 09/19/2019  Glucose 70 - 99 mg/dL 126(H) 108(H) 118(H)  BUN 8 - 23 mg/dL 23 23 19   Creatinine 0.44 - 1.00 mg/dL 0.85 0.63 0.68  Sodium 135 - 145 mmol/L 139 138 137  Potassium 3.5 - 5.1 mmol/L 3.8 3.7 4.0  Chloride 98 - 111 mmol/L 107 109 111  CO2 22 - 32 mmol/L 24 19(L) 21(L)  Calcium 8.9 - 10.3 mg/dL 8.7(L) 8.1(L) 8.4(L)  Total Protein 6.5 - 8.1 g/dL 8.6(H) - 9.8(H)  Total Bilirubin 0.3 - 1.2 mg/dL 0.5 - 0.9  Alkaline Phos 38 - 126 U/L 60 - 49  AST 15 - 41 U/L 30 - 31  ALT 0 - 44 U/L 30 - 24   02/27/2019 FISH:    RADIOGRAPHIC STUDIES: I have personally reviewed the radiological images as listed and agreed with the findings in the report. CT HEAD WO CONTRAST  Result  Date: 09/18/2019 CLINICAL DATA:  Stroke follow-up. EXAM: CT HEAD WITHOUT CONTRAST TECHNIQUE: Contiguous axial images were obtained from the base of the skull through the vertex without intravenous contrast. COMPARISON:  Two days ago FINDINGS: Brain: 20 mm hematoma in the superior and anterior left temporal subcortical lobe with rim of edema. 3 mm hyperdensity along the right posterior frontal cortex. No interval change. No visible infarct, hydrocephalus, or midline shift. Vascular: No hyperdense vessel or unexpected calcification. Skull: Normal. Negative for fracture or focal lesion. Sinuses/Orbits: Unremarkable IMPRESSION: Size stable sites of cerebral hemorrhage. No new abnormality or significant mass effect. Electronically Signed   By: Monte Fantasia M.D.   On: 09/18/2019 04:35   CT HEAD WO CONTRAST  Result Date: 09/16/2019 CLINICAL DATA:  Follow-up intracranial hemorrhage. EXAM: CT HEAD WITHOUT CONTRAST TECHNIQUE: Contiguous axial images were obtained from the base of the skull through the vertex without intravenous contrast. COMPARISON:  Earlier same day.  08/09/2018. FINDINGS: Brain: No change since earlier today. Maximal 21 mm in diameter intraparenchymal hemorrhage in the lateral left temporal lobe with vasogenic edema and a small amount of adjacent subarachnoid blood. Second 3.5 mm hyperdense focus present along the gyral surface of the right frontal parietal junction, axial image 17, also unchanged. Considerable concern for hemorrhagic metastases in this case. No evidence of midline shift. No sign of typical ischemic infarction. No hydrocephalus or extra-axial collection. Vascular: There is atherosclerotic calcification of the major vessels at the base of the brain. Skull: Negative Sinuses/Orbits: Clear/normal Other: None IMPRESSION: No change since earlier today. 21 mm hemorrhage or hemorrhagic lesion in the lateral left temporal lobe with surrounding vasogenic edema and a small amount of adjacent  subarachnoid hemorrhage. Second 3.5 mm hyperdense focus affecting the gyral surface in the right frontoparietal junction. Presence of this second lesion elevates the likelihood of metastatic disease. This would be an unusual  primary hemorrhage. Electronically Signed   By: Nelson Chimes M.D.   On: 09/16/2019 16:50   CT CHEST W CONTRAST  Result Date: 08/31/2019 CLINICAL DATA:  Inpatient. CLL, presenting for staging. New anemia and severe thrombocytopenia. EXAM: CT CHEST, ABDOMEN, AND PELVIS WITH CONTRAST TECHNIQUE: Multidetector CT imaging of the chest, abdomen and pelvis was performed following the standard protocol during bolus administration of intravenous contrast. CONTRAST:  139mL OMNIPAQUE IOHEXOL 300 MG/ML  SOLN COMPARISON:  05/07/2010 chest CT angiogram. FINDINGS: CT CHEST FINDINGS Cardiovascular: Normal heart size. No significant pericardial effusion/thickening. Left anterior descending coronary atherosclerosis. Atherosclerotic nonaneurysmal thoracic aorta. Normal caliber pulmonary arteries. No central pulmonary emboli. Mediastinum/Nodes: No discrete thyroid nodules. Unremarkable esophagus. No pathologically enlarged axillary, mediastinal or hilar lymph nodes. Lungs/Pleura: No pneumothorax. No pleural effusion. No acute consolidative airspace disease or lung masses. A few scattered perifissural and subpleural solid pulmonary nodules in both lungs measuring up to 5 mm in the anterior right lower lobe (series 5/image 75), all stable since 05/07/2010 chest CT, considered benign. No new significant pulmonary nodules. Mild platelike scarring versus atelectasis at the dependent lung bases bilaterally. Musculoskeletal: No aggressive appearing focal osseous lesions. Mild thoracic spondylosis. Chronic mild posterior T10 vertebral compression deformity. CT ABDOMEN PELVIS FINDINGS Hepatobiliary: Normal liver with no liver mass. Normal gallbladder with no radiopaque cholelithiasis. No biliary ductal dilatation. Small  periampullary duodenal diverticulum. Pancreas: Normal, with no mass or duct dilation. Spleen: Normal size. No mass. Adrenals/Urinary Tract: Normal adrenals. Normal kidneys with no hydronephrosis and no renal mass. Normal bladder. Stomach/Bowel: Small hiatal hernia. Otherwise normal nondistended stomach. Normal caliber small bowel with no small bowel wall thickening. Normal appendix. Oral contrast transits to the left colon. Moderate sigmoid diverticulosis, with no large bowel wall thickening or significant pericolonic fat stranding. Vascular/Lymphatic: Atherosclerotic nonaneurysmal abdominal aorta. Patent portal, splenic, hepatic and renal veins. Mildly enlarged 1.0 cm right inguinal node (series 3/image 105). Mild bilateral external iliac adenopathy up to 1.1 cm on the right (series 3/image 97) and 1.0 cm on the left (series 3/image 99). Mildly enlarged 1.2 cm left common iliac node (series 3/image 82). Left para-aortic adenopathy up to 1.2 cm (series 3/image 70). Aortocaval adenopathy up to 1.1 cm (series 3/image 73). Reproductive: Normal uterus. No right adnexal mass. Simple 5.3 cm left adnexal cyst (series 3/image 94). Other: No pneumoperitoneum, ascites or focal fluid collection. Musculoskeletal: No aggressive appearing focal osseous lesions. Moderate lower lumbar degenerative changes. IMPRESSION: 1. Mild multistation lymphadenopathy in the retroperitoneum and bilateral pelvis as detailed, compatible with lymphoma. Normal size spleen. No thoracic adenopathy. 2. One vessel coronary atherosclerosis. 3. Moderate sigmoid diverticulosis. 4. Simple 5.3 cm left adnexal cyst. Pelvic ultrasound evaluation recommended. This recommendation follows ACR consensus guidelines: White Paper of the ACR Incidental Findings Committee II on Adnexal Findings. J Am Coll Radiol 2013:10:675-681. 5.  Aortic Atherosclerosis (ICD10-I70.0). Electronically Signed   By: Ilona Sorrel M.D.   On: 08/31/2019 12:14   CT ABDOMEN PELVIS W  CONTRAST  Result Date: 08/31/2019 CLINICAL DATA:  Inpatient. CLL, presenting for staging. New anemia and severe thrombocytopenia. EXAM: CT CHEST, ABDOMEN, AND PELVIS WITH CONTRAST TECHNIQUE: Multidetector CT imaging of the chest, abdomen and pelvis was performed following the standard protocol during bolus administration of intravenous contrast. CONTRAST:  118mL OMNIPAQUE IOHEXOL 300 MG/ML  SOLN COMPARISON:  05/07/2010 chest CT angiogram. FINDINGS: CT CHEST FINDINGS Cardiovascular: Normal heart size. No significant pericardial effusion/thickening. Left anterior descending coronary atherosclerosis. Atherosclerotic nonaneurysmal thoracic aorta. Normal caliber pulmonary arteries. No central pulmonary emboli.  Mediastinum/Nodes: No discrete thyroid nodules. Unremarkable esophagus. No pathologically enlarged axillary, mediastinal or hilar lymph nodes. Lungs/Pleura: No pneumothorax. No pleural effusion. No acute consolidative airspace disease or lung masses. A few scattered perifissural and subpleural solid pulmonary nodules in both lungs measuring up to 5 mm in the anterior right lower lobe (series 5/image 75), all stable since 05/07/2010 chest CT, considered benign. No new significant pulmonary nodules. Mild platelike scarring versus atelectasis at the dependent lung bases bilaterally. Musculoskeletal: No aggressive appearing focal osseous lesions. Mild thoracic spondylosis. Chronic mild posterior T10 vertebral compression deformity. CT ABDOMEN PELVIS FINDINGS Hepatobiliary: Normal liver with no liver mass. Normal gallbladder with no radiopaque cholelithiasis. No biliary ductal dilatation. Small periampullary duodenal diverticulum. Pancreas: Normal, with no mass or duct dilation. Spleen: Normal size. No mass. Adrenals/Urinary Tract: Normal adrenals. Normal kidneys with no hydronephrosis and no renal mass. Normal bladder. Stomach/Bowel: Small hiatal hernia. Otherwise normal nondistended stomach. Normal caliber small  bowel with no small bowel wall thickening. Normal appendix. Oral contrast transits to the left colon. Moderate sigmoid diverticulosis, with no large bowel wall thickening or significant pericolonic fat stranding. Vascular/Lymphatic: Atherosclerotic nonaneurysmal abdominal aorta. Patent portal, splenic, hepatic and renal veins. Mildly enlarged 1.0 cm right inguinal node (series 3/image 105). Mild bilateral external iliac adenopathy up to 1.1 cm on the right (series 3/image 97) and 1.0 cm on the left (series 3/image 99). Mildly enlarged 1.2 cm left common iliac node (series 3/image 82). Left para-aortic adenopathy up to 1.2 cm (series 3/image 70). Aortocaval adenopathy up to 1.1 cm (series 3/image 73). Reproductive: Normal uterus. No right adnexal mass. Simple 5.3 cm left adnexal cyst (series 3/image 94). Other: No pneumoperitoneum, ascites or focal fluid collection. Musculoskeletal: No aggressive appearing focal osseous lesions. Moderate lower lumbar degenerative changes. IMPRESSION: 1. Mild multistation lymphadenopathy in the retroperitoneum and bilateral pelvis as detailed, compatible with lymphoma. Normal size spleen. No thoracic adenopathy. 2. One vessel coronary atherosclerosis. 3. Moderate sigmoid diverticulosis. 4. Simple 5.3 cm left adnexal cyst. Pelvic ultrasound evaluation recommended. This recommendation follows ACR consensus guidelines: White Paper of the ACR Incidental Findings Committee II on Adnexal Findings. J Am Coll Radiol 2013:10:675-681. 5.  Aortic Atherosclerosis (ICD10-I70.0). Electronically Signed   By: Ilona Sorrel M.D.   On: 08/31/2019 12:14   CT HEAD CODE STROKE WO CONTRAST  Result Date: 09/16/2019 CLINICAL DATA:  Code stroke. Neuro deficit, acute, stroke suspected. Additional history provided: Last known normal 9:50 AM, slurred speech and confusion EXAM: CT HEAD WITHOUT CONTRAST TECHNIQUE: Contiguous axial images were obtained from the base of the skull through the vertex without  intravenous contrast. COMPARISON:  CT head 08/09/2018, brain MRI 01/14/2016 FINDINGS: Brain: There is an acute parenchymal hemorrhage centered within the left temporal lobe with mild surrounding edema. The hemorrhage measures 1.6 x 1.6 x 2.0 cm. Trace surrounding subarachnoid hemorrhage. Minimal regional mass effect. No effacement of the ventricular system or midline shift. Additional 3 mm hyperdense focus within the cortex of the right parietal lobe (series 6, image 11) (series 3, image 17). No extra-axial fluid collection. Mild ill-defined hypoattenuation within the cerebral white matter is nonspecific, but consistent with chronic small vessel ischemic disease. Mild generalized parenchymal atrophy. Vascular: No hyperdense vessel.  Atherosclerotic calcifications. Skull: Normal. Negative for fracture or focal lesion. Sinuses/Orbits: Visualized orbits demonstrate no acute abnormality. Other: No significant paranasal sinus disease or mastoid effusion at the imaged levels. These results were called by telephone at the time of interpretation on 09/16/2019 at 10:32 am to provider Dr. Rory Percy,  who verbally acknowledged these results. IMPRESSION: 2 cm left temporal lobe parenchymal hemorrhage with mild surrounding edema. Trace surrounding subarachnoid hemorrhage. Contrast-enhanced brain MRI is recommended once the hematoma involutes to assess for an underlying lesion. Additional 3 mm hyperdense focus within the right parietal cortex. This may reflect an additional tiny parenchymal hemorrhage or other hyperdense lesion. This too should have MRI follow-up. Generalized parenchymal atrophy and chronic small vessel ischemic disease. Electronically Signed   By: Kellie Simmering DO   On: 09/16/2019 10:34   VAS [place] LOWER EXTREMITY VENOUS (DVT)  Result Date: 09/01/2019  Lower Venous Study Indications: Edema.  Comparison Study: no prior Performing Technologist: June Leap RDMS, RVT  Examination Guidelines: A complete evaluation  includes B-mode imaging, spectral Doppler, color Doppler, and power Doppler as needed of all accessible portions of each vessel. Bilateral testing is considered an integral part of a complete examination. Limited examinations for reoccurring indications may be performed as noted.  +-----+---------------+---------+-----------+----------+--------------+ RIGHTCompressibilityPhasicitySpontaneityPropertiesThrombus Aging +-----+---------------+---------+-----------+----------+--------------+ CFV  Full           Yes      Yes                                 +-----+---------------+---------+-----------+----------+--------------+   +---------+---------------+---------+-----------+----------+--------------+ LEFT     CompressibilityPhasicitySpontaneityPropertiesThrombus Aging +---------+---------------+---------+-----------+----------+--------------+ CFV      Full           Yes      Yes                                 +---------+---------------+---------+-----------+----------+--------------+ SFJ      Full                                                        +---------+---------------+---------+-----------+----------+--------------+ FV Prox  Full                                                        +---------+---------------+---------+-----------+----------+--------------+ FV Mid   Full                                                        +---------+---------------+---------+-----------+----------+--------------+ FV DistalFull                                                        +---------+---------------+---------+-----------+----------+--------------+ PFV      Full                                                        +---------+---------------+---------+-----------+----------+--------------+ POP  Full           Yes      Yes                                 +---------+---------------+---------+-----------+----------+--------------+ PTV       Full                                                        +---------+---------------+---------+-----------+----------+--------------+ PERO     Full                                                        +---------+---------------+---------+-----------+----------+--------------+     Summary: Right: No evidence of common femoral vein obstruction. Left: There is no evidence of deep vein thrombosis in the lower extremity. No cystic structure found in the popliteal fossa.  *See table(s) above for measurements and observations. Electronically signed by Harold Barban MD on 09/01/2019 at 4:38:59 PM.    Final     ASSESSMENT & PLAN:   #1 CLL Previous CLL prognostic FISH panel without detectable mutations. Has been Rai stage 0.. Now with new anemia and severe thrombocytopenia. WBC counts have increased significantly to 84k from 39k 3 months ago.  Concerning for change in tempo of her disease. -08/31/2019 CT C/A/P ([id number]) ([id number]) revealed "1. Mild multistation lymphadenopathy in the retroperitoneum and bilateral pelvis as detailed, compatible with lymphoma. Normal size spleen. 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  However cannot rule out bone marrow involvement as an etiology of thrombocytopenia as well.  #3 new mild normocytic anemia.  Hemoglobin 11.7 with an MCV of 97.4. Likely related to her CLL. Elevated LDH level in the 500s would need to rule out autoimmune hemolysis. Haptoglobin pending Coombs test positive for IgG suggesting could be an element of autoimmune hemolysis.  #4 Recent intraparenchymal bleed PLAN: -Discussed pt labwork today, 09/28/19; all values are WNL except for WBC at 20.8K, Glucose at 126, Calcium at 8.7, Total Protein at 8.6, Albumin at 3.4.  -Discussed 09/28/2019 LDH is WNL at 185 -Hgb has improved (10.0 -> 13.2),  anemia appears to be related to CLL -platelets have improved to 151k. -Advised pt to continue Prednisone at 40 mg per day for 1 week -The pt has no prohibitive toxicities from continuing C2 of Rituxan at this time -Plan to complete four cycles of Rituxan and then will consider need for Venetoclax -Refill Prednisone. -Will see back in 1 week with labs -continue f/u with PCP to optimize BP control  FOLLOW UP: Please schedule for cycle 3 and cycle 4 of weekly Rituxan with labs and MD visit as per orders   The total time spent in the appt was 30 minutes and more than 50% was on counseling and direct patient cares.  All of the patient's questions were answered with apparent satisfaction. The patient knows to call the clinic with any problems, questions or concerns.    Sullivan Lone MD [place] AAHIVMS [facility] Hematology/Oncology  Physician [place]  (Office):       [phone] (Work cell):  [phone] (Fax):           [phone]  09/28/2019 10:58 AM  I, Yevette Edwards, am acting as a scribe for Dr. Sullivan Lone.   .I have reviewed the above documentation for accuracy and completeness, and I agree with the above. [name] MD

## 2019-09-28 ENCOUNTER — Inpatient Hospital Stay (HOSPITAL_BASED_OUTPATIENT_CLINIC_OR_DEPARTMENT_OTHER): Payer: PPO | Admitting: Hematology

## 2019-09-28 ENCOUNTER — Other Ambulatory Visit: Payer: Self-pay

## 2019-09-28 ENCOUNTER — Inpatient Hospital Stay: Payer: PPO

## 2019-09-28 ENCOUNTER — Other Ambulatory Visit: Payer: Self-pay | Admitting: *Deleted

## 2019-09-28 ENCOUNTER — Inpatient Hospital Stay: Payer: PPO | Attending: Hematology

## 2019-09-28 ENCOUNTER — Telehealth: Payer: Self-pay | Admitting: Hematology

## 2019-09-28 VITALS — BP 151/69 | HR 77 | Temp 98.3°F | Resp 20

## 2019-09-28 VITALS — BP 162/90 | HR 75 | Temp 98.5°F | Resp 17 | Ht <= 58 in | Wt 104.4 lb

## 2019-09-28 DIAGNOSIS — D693 Immune thrombocytopenic purpura: Secondary | ICD-10-CM

## 2019-09-28 DIAGNOSIS — Z5112 Encounter for antineoplastic immunotherapy: Secondary | ICD-10-CM | POA: Diagnosis not present

## 2019-09-28 DIAGNOSIS — C9110 Chronic lymphocytic leukemia of B-cell type not having achieved remission: Secondary | ICD-10-CM

## 2019-09-28 DIAGNOSIS — D696 Thrombocytopenia, unspecified: Secondary | ICD-10-CM | POA: Insufficient documentation

## 2019-09-28 DIAGNOSIS — E785 Hyperlipidemia, unspecified: Secondary | ICD-10-CM | POA: Diagnosis not present

## 2019-09-28 DIAGNOSIS — Z7952 Long term (current) use of systemic steroids: Secondary | ICD-10-CM | POA: Diagnosis not present

## 2019-09-28 DIAGNOSIS — D649 Anemia, unspecified: Secondary | ICD-10-CM | POA: Insufficient documentation

## 2019-09-28 DIAGNOSIS — R7402 Elevation of levels of lactic acid dehydrogenase (LDH): Secondary | ICD-10-CM | POA: Diagnosis not present

## 2019-09-28 DIAGNOSIS — F419 Anxiety disorder, unspecified: Secondary | ICD-10-CM | POA: Insufficient documentation

## 2019-09-28 DIAGNOSIS — Z79899 Other long term (current) drug therapy: Secondary | ICD-10-CM | POA: Insufficient documentation

## 2019-09-28 DIAGNOSIS — I10 Essential (primary) hypertension: Secondary | ICD-10-CM | POA: Insufficient documentation

## 2019-09-28 DIAGNOSIS — H409 Unspecified glaucoma: Secondary | ICD-10-CM | POA: Diagnosis not present

## 2019-09-28 DIAGNOSIS — R5383 Other fatigue: Secondary | ICD-10-CM | POA: Insufficient documentation

## 2019-09-28 LAB — CMP (CANCER CENTER ONLY)
ALT: 30 U/L (ref 0–44)
AST: 30 U/L (ref 15–41)
Albumin: 3.4 g/dL — ABNORMAL LOW (ref 3.5–5.0)
Alkaline Phosphatase: 60 U/L (ref 38–126)
Anion gap: 8 (ref 5–15)
BUN: 23 mg/dL (ref 8–23)
CO2: 24 mmol/L (ref 22–32)
Calcium: 8.7 mg/dL — ABNORMAL LOW (ref 8.9–10.3)
Chloride: 107 mmol/L (ref 98–111)
Creatinine: 0.85 mg/dL (ref 0.44–1.00)
GFR, Est AFR Am: >60 mL/min (ref >60)
GFR, Estimated: >60 mL/min (ref >60)
Glucose, Bld: 126 mg/dL — ABNORMAL HIGH (ref 70–99)
Potassium: 3.8 mmol/L (ref 3.5–5.1)
Sodium: 139 mmol/L (ref 135–145)
Total Bilirubin: 0.5 mg/dL (ref 0.3–1.2)
Total Protein: 8.6 g/dL — ABNORMAL HIGH (ref 6.5–8.1)

## 2019-09-28 LAB — CBC WITH DIFFERENTIAL (CANCER CENTER ONLY)
Abs Immature Granulocytes: 0.05 10*3/uL (ref 0.00–0.07)
Basophils Absolute: 0 10*3/uL (ref 0.0–0.1)
Basophils Relative: 0 %
Eosinophils Absolute: 0.1 10*3/uL (ref 0.0–0.5)
Eosinophils Relative: 1 %
HCT: 40.8 % (ref 36.0–46.0)
Hemoglobin: 13.2 g/dL (ref 12.0–15.0)
Immature Granulocytes: 0 %
Lymphocytes Relative: 66 %
Lymphs Abs: 13.7 10*3/uL — ABNORMAL HIGH (ref 0.7–4.0)
MCH: 31.2 pg (ref 26.0–34.0)
MCHC: 32.4 g/dL (ref 30.0–36.0)
MCV: 96.5 fL (ref 80.0–100.0)
Monocytes Absolute: 0.4 10*3/uL (ref 0.1–1.0)
Monocytes Relative: 2 %
Neutro Abs: 6.5 10*3/uL (ref 1.7–7.7)
Neutrophils Relative %: 31 %
Platelet Count: 151 10*3/uL (ref 150–400)
RBC: 4.23 MIL/uL (ref 3.87–5.11)
RDW: 15.5 % (ref 11.5–15.5)
WBC Count: 20.8 10*3/uL — ABNORMAL HIGH (ref 4.0–10.5)
nRBC: 0 % (ref 0.0–0.2)

## 2019-09-28 LAB — LACTATE DEHYDROGENASE: LDH: 185 U/L (ref 98–192)

## 2019-09-28 LAB — SAMPLE TO BLOOD BANK

## 2019-09-28 MED ORDER — SODIUM CHLORIDE 0.9 % IV SOLN
375.0000 mg/m2 | Freq: Once | INTRAVENOUS | Status: AC
  Administered 2019-09-28: 500 mg via INTRAVENOUS
  Filled 2019-09-28: qty 50

## 2019-09-28 MED ORDER — SODIUM CHLORIDE 0.9 % IV SOLN: 375.0000 mg/m2 | Freq: Once | INTRAVENOUS | Status: DC

## 2019-09-28 MED ORDER — DIPHENHYDRAMINE HCL 25 MG PO CAPS
50.0000 mg | ORAL_CAPSULE | Freq: Once | ORAL | Status: AC
  Administered 2019-09-28: 50 mg via ORAL

## 2019-09-28 MED ORDER — PREDNISONE 10 MG PO TABS: ORAL_TABLET | ORAL | 0 refills | Status: DC

## 2019-09-28 MED ORDER — SODIUM CHLORIDE 0.9 % IV SOLN
Freq: Once | INTRAVENOUS | Status: AC
  Filled 2019-09-28: qty 250

## 2019-09-28 MED ORDER — METHYLPREDNISOLONE SODIUM SUCC 125 MG IJ SOLR
80.0000 mg | Freq: Once | INTRAMUSCULAR | Status: AC
  Administered 2019-09-28: 80 mg via INTRAVENOUS

## 2019-09-28 MED ORDER — ACETAMINOPHEN 325 MG PO TABS
650.0000 mg | ORAL_TABLET | Freq: Once | ORAL | Status: AC
  Administered 2019-09-28: 650 mg via ORAL

## 2019-09-28 MED ORDER — DIPHENHYDRAMINE HCL 25 MG PO CAPS
ORAL_CAPSULE | ORAL | Status: AC
  Filled 2019-09-28: qty 2

## 2019-09-28 MED ORDER — METHYLPREDNISOLONE SODIUM SUCC 125 MG IJ SOLR
INTRAMUSCULAR | Status: AC
  Filled 2019-09-28: qty 2

## 2019-09-28 MED ORDER — ACETAMINOPHEN 325 MG PO TABS
ORAL_TABLET | ORAL | Status: AC
  Filled 2019-09-28: qty 2

## 2019-09-28 NOTE — Patient Outreach (Signed)
Selena Branch [facility]) Care Management  09/28/2019  Selena Branch [birth year] [id number]   EMMI- stroke On APL RED ON EMMI ALERT Day # 6 Date: 09/27/19 1000 Red Alert Reason: Feeling worse overall? 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 with outpatient occupational therapy (OT) 08/20/19 admission for idiopathic thrombocytopenia, CLL (chronic lymphocytic leukemia)   Transition of care services noted to be completed by primary care MD office staff -Dr Blanchard Mane family Medicine at [place] attempt # 1 unsuccessful  Her husband Tommy answered.he states [name] is not at home and is getting oncology treatment. [facility] RN CM left HIPAA compliant voicemail message along with CM's contact info.    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 Lavina Hamman, RN, BSN, [place] Coordinator Office number [phone] Mobile number [phone]  Main [facility] number [phone] Fax number [phone]

## 2019-09-28 NOTE — Patient Outreach (Addendum)
[facility]) Care Management  09/28/2019  Selena Branch 05-09-1941 [id number]   EMMI- stroke On APL RED ON EMMI ALERT Day # 6 Date: 09/27/19 1000 Red Alert Reason: Feeling worse overall? 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 with outpatient occupational therapy (OT) 08/20/19 admission for idiopathic thrombocytopenia, CLL (chronic lymphocytic leukemia)   Transition of care services noted to be completed by primary care MD office staff -Dr Blanchard Mane family Medicine at [place]  Patient returned a call to [facility] RN CM Patient is able to verify HIPAA, DOB and Address Reviewed and addressed EMMI red alert with patient  EMMI  Selena Branch shares she was not able to get the [facility] automated services to understand her and that all the recorded answers are incorrect She reports she is not feeling worse overall nor does she have any new problems walking, talking, speaking or seeing  She states her recent stroke was related to her CLL. She states her oncologist does not want he to see the neurologist until after her "four treatments. I have just completed two."  She reports she at this time do not feel she is needing neuro rehab outpatient services She reports it has been "two weeks and I have been managing my own medicines"   Selena Branch reports she has been "out since nine this morning" she reports the need to rest but agrees to a follow call  Social: Selena Branch is a 79 year old retired married patient who lives at home with her husband, Selena Branch. Selena Branch reports Mr Selena Branch has dementia and Selena Branch's primary care giver is her daughter, Selena Branch.  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, Hyperlipidemia (HLD), anemia, intracranial bleed, hx of acute encephalopathy, hyponatremia, hypokalemia, glaucoma left and right eyes, 09/15/19 SARS negative   Denies falls  DME: none Medications: She denies concerns with taking medications as prescribed, affording medications, side effects of medications and questions about medications   Appointments: Weekly oncology visits with Dr Inetta Fermo, infusions starting 09/28/19 09/28/19 oncology visits with Dr Irene Limbo, infusions -chemo Ruxience   Advance Directives: her daughter Selena Branch is her POA  Consent: [facility] RN CM reviewed [facility] services with patient. Patient gave verbal consent for [facility] telephonic RN CM services.  Plan: [facility] RN CM will follow up with Selena Branch within 7-14 business days, assess for further needs as agreed   Pt encouraged to return a call to [place] CM prn   [facility] RN CM discussed the outreach letter already sent on with [facility] brochure enclosed for review  Routed note to MDs/NP/PA   [place]. Lavina Hamman, RN, BSN, [place] Coordinator Office number [phone] Mobile number [phone]  Main [facility] number [phone] Fax number [phone]

## 2019-09-28 NOTE — Patient Instructions (Addendum)
[place] Discharge Instructions for Patients Receiving Chemotherapy  Today you received the following chemotherapy agents Ruxience.  To help prevent nausea and vomiting after your treatment, we encourage you to take your nausea medication.   If you develop nausea and vomiting that is not controlled by your nausea medication, call the clinic.   BELOW ARE SYMPTOMS THAT SHOULD BE REPORTED IMMEDIATELY:  *FEVER GREATER THAN 100.5 F  *CHILLS WITH OR WITHOUT FEVER  NAUSEA AND VOMITING THAT IS NOT CONTROLLED WITH YOUR NAUSEA MEDICATION  *UNUSUAL SHORTNESS OF BREATH  *UNUSUAL BRUISING OR BLEEDING  TENDERNESS IN MOUTH AND THROAT WITH OR WITHOUT PRESENCE OF ULCERS  *URINARY PROBLEMS  *BOWEL PROBLEMS  UNUSUAL RASH Items with * indicate a potential emergency and should be followed up as soon as possible.  Feel free to call the clinic should you have any questions or concerns. The clinic phone number is [phone].  Please show the [place] at check-in to the Emergency Department and triage nurse.  Rituximab injection What is this medicine? RITUXIMAB (ri TUX i mab) is a monoclonal antibody. It is used to treat certain types of cancer like non-Hodgkin lymphoma and chronic lymphocytic leukemia. It is also used to treat rheumatoid arthritis, granulomatosis with polyangiitis (or Wegener's granulomatosis), microscopic polyangiitis, and pemphigus vulgaris. This medicine may be used for other purposes; ask your health care provider or pharmacist if you have questions. COMMON BRAND NAME(S): Rituxan, RUXIENCE What should I tell my health care provider before I take this medicine? They need to know if you have any of these conditions:  heart disease  infection (especially a virus infection such as hepatitis B, chickenpox, cold sores, or herpes)  immune system problems  irregular heartbeat  kidney disease  low blood counts, like low white cell, platelet, or red  cell counts  lung or breathing disease, like asthma  recently received or scheduled to receive a vaccine  an unusual or allergic reaction to rituximab, other medicines, foods, dyes, or preservatives  pregnant or trying to get pregnant  breast-feeding How should I use this medicine? This medicine is for infusion into a vein. It is administered in a hospital or clinic by a specially trained health care professional. A special MedGuide will be given to you by the pharmacist with each prescription and refill. Be sure to read this information carefully each time. Talk to your pediatrician regarding the use of this medicine in children. This medicine is not approved for use in children. Overdosage: If you think you have taken too much of this medicine contact a poison control center or emergency room at once. NOTE: This medicine is only for you. Do not share this medicine with others. What if I miss a dose? It is important not to miss a dose. Call your doctor or health care professional if you are unable to keep an appointment. What may interact with this medicine?  cisplatin  live virus vaccines This list may not describe all possible interactions. Give your health care provider a list of all the medicines, herbs, non-prescription drugs, or dietary supplements you use. Also tell them if you smoke, drink alcohol, or use illegal drugs. Some items may interact with your medicine. What should I watch for while using this medicine? Your condition will be monitored carefully while you are receiving this medicine. You may need blood work done while you are taking this medicine. This medicine can cause serious allergic reactions. To reduce your risk you may need to  take medicine before treatment with this medicine. Take your medicine as directed. In some patients, this medicine may cause a serious brain infection that may cause death. If you have any problems seeing, thinking, speaking, walking, or  standing, tell your healthcare professional right away. If you cannot reach your healthcare professional, urgently seek other source of medical care. Call your doctor or health care professional for advice if you get a fever, chills or sore throat, or other symptoms of a cold or flu. Do not treat yourself. This drug decreases your body's ability to fight infections. Try to avoid being around people who are sick. Do not become pregnant while taking this medicine or for at least 12 months after stopping it. Women should inform their doctor if they wish to become pregnant or think they might be pregnant. There is a potential for serious side effects to an unborn child. Talk to your health care professional or pharmacist for more information. Do not breast-feed an infant while taking this medicine or for at least 6 months after stopping it. What side effects may I notice from receiving this medicine? Side effects that you should report to your doctor or health care professional as soon as possible:  allergic reactions like skin rash, itching or hives; swelling of the face, lips, or tongue  breathing problems  chest pain  changes in vision  diarrhea  headache with fever, neck stiffness, sensitivity to light, nausea, or confusion  fast, irregular heartbeat  loss of memory  low blood counts - this medicine may decrease the number of white blood cells, red blood cells and platelets. You may be at increased risk for infections and bleeding.  mouth sores  problems with balance, talking, or walking  redness, blistering, peeling or loosening of the skin, including inside the mouth  signs of infection - fever or chills, cough, sore throat, pain or difficulty passing urine  signs and symptoms of kidney injury like trouble passing urine or change in the amount of urine  signs and symptoms of liver injury like dark yellow or brown urine; general ill feeling or flu-like symptoms; light-colored  stools; loss of appetite; nausea; right upper belly pain; unusually weak or tired; yellowing of the eyes or skin  signs and symptoms of low blood pressure like dizziness; feeling faint or lightheaded, falls; unusually weak or tired  stomach pain  swelling of the ankles, feet, hands  unusual bleeding or bruising  vomiting Side effects that usually do not require medical attention (report to your doctor or health care professional if they continue or are bothersome):  headache  joint pain  muscle cramps or muscle pain  nausea  tiredness This list may not describe all possible side effects. Call your doctor for medical advice about side effects. You may report side effects to FDA at 1-800-FDA-1088. Where should I keep my medicine? This drug is given in a hospital or clinic and will not be stored at home. NOTE: This sheet is a summary. It may not cover all possible information. If you have questions about this medicine, talk to your doctor, pharmacist, or health care provider.  2020 Elsevier/Gold Standard (2018-10-11 22:01:36)

## 2019-09-28 NOTE — Telephone Encounter (Signed)
Scheduled appt per 1/15 los.  Patient will get a print out while in treatment.

## 2019-09-30 DIAGNOSIS — E782 Mixed hyperlipidemia: Secondary | ICD-10-CM | POA: Diagnosis not present

## 2019-09-30 DIAGNOSIS — I10 Essential (primary) hypertension: Secondary | ICD-10-CM | POA: Diagnosis not present

## 2019-09-30 DIAGNOSIS — M810 Age-related osteoporosis without current pathological fracture: Secondary | ICD-10-CM | POA: Diagnosis not present

## 2019-09-30 DIAGNOSIS — H409 Unspecified glaucoma: Secondary | ICD-10-CM | POA: Diagnosis not present

## 2019-09-30 DIAGNOSIS — I618 Other nontraumatic intracerebral hemorrhage: Secondary | ICD-10-CM | POA: Diagnosis not present

## 2019-10-01 ENCOUNTER — Other Ambulatory Visit: Payer: Self-pay | Admitting: *Deleted

## 2019-10-01 NOTE — Patient Outreach (Signed)
Red EMMI stroke flag received for 09/30/19 Day #9, Lost interest, sad, anxious, hopeless- Yes.  Outreach call to pt to address red flag, spoke with pt, HIPAA verified, pt reports " no that's incorrect, I didn't say that, I'm doing surprisingly well"  No new concerns voiced.  Jacqlyn Larsen [facility], [place] Coordinator [phone]

## 2019-10-02 ENCOUNTER — Other Ambulatory Visit: Payer: Self-pay | Admitting: *Deleted

## 2019-10-02 DIAGNOSIS — U071 COVID-19: Secondary | ICD-10-CM | POA: Diagnosis not present

## 2019-10-02 DIAGNOSIS — C9110 Chronic lymphocytic leukemia of B-cell type not having achieved remission: Secondary | ICD-10-CM | POA: Diagnosis not present

## 2019-10-02 DIAGNOSIS — I10 Essential (primary) hypertension: Secondary | ICD-10-CM | POA: Diagnosis not present

## 2019-10-02 DIAGNOSIS — I618 Other nontraumatic intracerebral hemorrhage: Secondary | ICD-10-CM | POA: Diagnosis not present

## 2019-10-02 DIAGNOSIS — F4320 Adjustment disorder, unspecified: Secondary | ICD-10-CM | POA: Diagnosis not present

## 2019-10-02 DIAGNOSIS — E559 Vitamin D deficiency, unspecified: Secondary | ICD-10-CM | POA: Diagnosis not present

## 2019-10-02 DIAGNOSIS — D693 Immune thrombocytopenic purpura: Secondary | ICD-10-CM | POA: Diagnosis not present

## 2019-10-02 DIAGNOSIS — H409 Unspecified glaucoma: Secondary | ICD-10-CM | POA: Diagnosis not present

## 2019-10-02 DIAGNOSIS — R600 Localized edema: Secondary | ICD-10-CM | POA: Diagnosis not present

## 2019-10-02 DIAGNOSIS — M810 Age-related osteoporosis without current pathological fracture: Secondary | ICD-10-CM | POA: Diagnosis not present

## 2019-10-02 DIAGNOSIS — E782 Mixed hyperlipidemia: Secondary | ICD-10-CM | POA: Diagnosis not present

## 2019-10-02 NOTE — Patient Outreach (Signed)
[place] [facility]) Care Management  10/02/2019  Selena Branch 1941/02/08 [id number]   10/02/19 encounter opened in error  [place] L. Lavina Hamman, RN, BSN, [place] Coordinator Office number [phone] Mobile number [phone]  Main [facility] number [phone] Fax number [phone]

## 2019-10-03 ENCOUNTER — Ambulatory Visit: Payer: PPO | Admitting: *Deleted

## 2019-10-04 ENCOUNTER — Other Ambulatory Visit: Payer: Self-pay

## 2019-10-04 ENCOUNTER — Other Ambulatory Visit: Payer: Self-pay | Admitting: *Deleted

## 2019-10-04 NOTE — Patient Outreach (Signed)
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   Successful contact Patient is able to verify HIPAA Reviewed and addressed the purpose of the follow up call with the patient  Consent: [facility] RN CM reviewed [facility] services with patient. Patient gave verbal consent for services. Today Selena Branch reports she is doing better and improving in all her health concerns She denies need of assist with pill packing,, She reports walking without need of a walker. She confirms her next chemo appointment and denies worsening symptoms after the last one She remains very active and is presently "in the middle of cooking dinner" She confirms she is not driving so her husband is driving her to appointments with her guidance She reports having BP values that are improving She provided the BP range of 119/61 (this morning) and highest value of 155/61 earlier in the week She continues to take her medications as ordered   Selena Branch denies need of further [facility] services [facility] RN CM did review with her that if there is another EMMI alert she may have an outreach call from this [facility] RN CM or another. She voiced understanding and appreciation for outreach calls "to check on me" She agrees to case closure at this time   Social: Selena Branch is a 79 year old retired married patient who  lives at home with her husband, Selena Branch. Selena Branch reports Mr Selena Branch has dementia and Selena Branch's primary care giver is her daughter, Selena Branch.  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   Pt encouraged to return a call to [facility] RN CM prn    Markell Schrier L. Lavina Hamman, RN, BSN, [place] Coordinator Office number [phone] Mobile number [phone]  Main [facility] number [phone] Fax number [phone]

## 2019-10-05 ENCOUNTER — Inpatient Hospital Stay: Payer: PPO

## 2019-10-05 ENCOUNTER — Other Ambulatory Visit: Payer: Self-pay | Admitting: *Deleted

## 2019-10-05 ENCOUNTER — Inpatient Hospital Stay (HOSPITAL_BASED_OUTPATIENT_CLINIC_OR_DEPARTMENT_OTHER): Payer: PPO | Admitting: Hematology

## 2019-10-05 ENCOUNTER — Other Ambulatory Visit: Payer: Self-pay

## 2019-10-05 ENCOUNTER — Telehealth: Payer: Self-pay | Admitting: Hematology

## 2019-10-05 ENCOUNTER — Telehealth: Payer: Self-pay | Admitting: *Deleted

## 2019-10-05 VITALS — BP 173/63 | HR 99 | Temp 98.1°F | Resp 18

## 2019-10-05 VITALS — BP 177/66 | HR 76 | Temp 98.7°F | Resp 16 | Ht <= 58 in | Wt 102.3 lb

## 2019-10-05 DIAGNOSIS — D693 Immune thrombocytopenic purpura: Secondary | ICD-10-CM

## 2019-10-05 DIAGNOSIS — D649 Anemia, unspecified: Secondary | ICD-10-CM

## 2019-10-05 DIAGNOSIS — C9110 Chronic lymphocytic leukemia of B-cell type not having achieved remission: Secondary | ICD-10-CM

## 2019-10-05 DIAGNOSIS — I629 Nontraumatic intracranial hemorrhage, unspecified: Secondary | ICD-10-CM

## 2019-10-05 DIAGNOSIS — Z5112 Encounter for antineoplastic immunotherapy: Secondary | ICD-10-CM | POA: Diagnosis not present

## 2019-10-05 LAB — CBC WITH DIFFERENTIAL (CANCER CENTER ONLY)
Abs Immature Granulocytes: 0.06 10*3/uL (ref 0.00–0.07)
Abs Immature Granulocytes: 0.1 10*3/uL — ABNORMAL HIGH (ref 0.00–0.07)
Basophils Absolute: 0 10*3/uL (ref 0.0–0.1)
Basophils Absolute: 0 10*3/uL (ref 0.0–0.1)
Basophils Relative: 0 %
Basophils Relative: 0 %
Eosinophils Absolute: 0 10*3/uL (ref 0.0–0.5)
Eosinophils Absolute: 0 10*3/uL (ref 0.0–0.5)
Eosinophils Relative: 0 %
Eosinophils Relative: 0 %
HCT: 41.7 % (ref 36.0–46.0)
HCT: 40.6 % (ref 36.0–46.0)
Hemoglobin: 13.8 g/dL (ref 12.0–15.0)
Hemoglobin: 13.3 g/dL (ref 12.0–15.0)
Immature Granulocytes: 0 %
Immature Granulocytes: 0 %
Lymphocytes Relative: 65 %
Lymphocytes Relative: 68 %
Lymphs Abs: 15.3 10*3/uL — ABNORMAL HIGH (ref 0.7–4.0)
Lymphs Abs: 18.4 10*3/uL — ABNORMAL HIGH (ref 0.7–4.0)
MCH: 31.4 pg (ref 26.0–34.0)
MCH: 30.8 pg (ref 26.0–34.0)
MCHC: 33.1 g/dL (ref 30.0–36.0)
MCHC: 32.8 g/dL (ref 30.0–36.0)
MCV: 94.8 fL (ref 80.0–100.0)
MCV: 94 fL (ref 80.0–100.0)
Monocytes Absolute: 0.3 10*3/uL (ref 0.1–1.0)
Monocytes Absolute: 0.2 10*3/uL (ref 0.1–1.0)
Monocytes Relative: 1 %
Monocytes Relative: 1 %
Neutro Abs: 7.9 10*3/uL — ABNORMAL HIGH (ref 1.7–7.7)
Neutro Abs: 8.5 10*3/uL — ABNORMAL HIGH (ref 1.7–7.7)
Neutrophils Relative %: 34 %
Neutrophils Relative %: 31 %
Platelet Count: 6 10*3/uL — CL (ref 150–400)
Platelet Count: 150 10*3/uL (ref 150–400)
RBC: 4.4 MIL/uL (ref 3.87–5.11)
RBC: 4.32 MIL/uL (ref 3.87–5.11)
RDW: 14.8 % (ref 11.5–15.5)
RDW: 14.8 % (ref 11.5–15.5)
WBC Count: 23.5 10*3/uL — ABNORMAL HIGH (ref 4.0–10.5)
WBC Count: 27.2 10*3/uL — ABNORMAL HIGH (ref 4.0–10.5)
nRBC: 0 % (ref 0.0–0.2)
nRBC: 0 % (ref 0.0–0.2)

## 2019-10-05 LAB — CMP (CANCER CENTER ONLY)
ALT: 30 U/L (ref 0–44)
AST: 30 U/L (ref 15–41)
Albumin: 3.8 g/dL (ref 3.5–5.0)
Alkaline Phosphatase: 59 U/L (ref 38–126)
Anion gap: 10 (ref 5–15)
BUN: 23 mg/dL (ref 8–23)
CO2: 24 mmol/L (ref 22–32)
Calcium: 9.3 mg/dL (ref 8.9–10.3)
Chloride: 104 mmol/L (ref 98–111)
Creatinine: 0.78 mg/dL (ref 0.44–1.00)
GFR, Est AFR Am: >60 mL/min (ref >60)
GFR, Estimated: >60 mL/min (ref >60)
Glucose, Bld: 122 mg/dL — ABNORMAL HIGH (ref 70–99)
Potassium: 3.9 mmol/L (ref 3.5–5.1)
Sodium: 138 mmol/L (ref 135–145)
Total Bilirubin: 0.7 mg/dL (ref 0.3–1.2)
Total Protein: 8.1 g/dL (ref 6.5–8.1)

## 2019-10-05 LAB — SAMPLE TO BLOOD BANK

## 2019-10-05 LAB — LACTATE DEHYDROGENASE: LDH: 204 U/L — ABNORMAL HIGH (ref 98–192)

## 2019-10-05 LAB — IMMATURE PLATELET FRACTION: Immature Platelet Fraction: 2.3 % (ref 1.2–8.6)

## 2019-10-05 MED ORDER — SODIUM CHLORIDE 0.9 % IV SOLN
Freq: Once | INTRAVENOUS | Status: AC
  Filled 2019-10-05: qty 250

## 2019-10-05 MED ORDER — SODIUM CHLORIDE 0.9 % IV SOLN
375.0000 mg/m2 | Freq: Once | INTRAVENOUS | Status: AC
  Administered 2019-10-05: 14:00:00 500 mg via INTRAVENOUS
  Filled 2019-10-05: qty 50

## 2019-10-05 MED ORDER — ROMIPLOSTIM 250 MCG ~~LOC~~ SOLR
2.0000 ug/kg | SUBCUTANEOUS | Status: DC
  Administered 2019-10-05: 15:00:00 95 ug via SUBCUTANEOUS
  Filled 2019-10-05: qty 0.19

## 2019-10-05 MED ORDER — ACETAMINOPHEN 325 MG PO TABS
650.0000 mg | ORAL_TABLET | Freq: Once | ORAL | Status: AC
  Administered 2019-10-05: 650 mg via ORAL

## 2019-10-05 MED ORDER — DIPHENHYDRAMINE HCL 25 MG PO CAPS
50.0000 mg | ORAL_CAPSULE | Freq: Once | ORAL | Status: AC
  Administered 2019-10-05: 12:00:00 50 mg via ORAL

## 2019-10-05 MED ORDER — DIPHENHYDRAMINE HCL 25 MG PO CAPS
ORAL_CAPSULE | ORAL | Status: AC
  Filled 2019-10-05: qty 1

## 2019-10-05 MED ORDER — METHYLPREDNISOLONE SODIUM SUCC 125 MG IJ SOLR
INTRAMUSCULAR | Status: AC
  Filled 2019-10-05: qty 2

## 2019-10-05 MED ORDER — METHYLPREDNISOLONE SODIUM SUCC 125 MG IJ SOLR
125.0000 mg | Freq: Once | INTRAMUSCULAR | Status: AC
  Administered 2019-10-05: 12:00:00 125 mg via INTRAVENOUS

## 2019-10-05 MED ORDER — ACETAMINOPHEN 325 MG PO TABS
ORAL_TABLET | ORAL | Status: AC
  Filled 2019-10-05: qty 2

## 2019-10-05 NOTE — Telephone Encounter (Signed)
I left a message regarding schedule  

## 2019-10-05 NOTE — Patient Outreach (Signed)
[facility]) Care Management  10/05/2019  Selena Branch Sep 15, 1940 [id number]   [place] documentation for EMMI- stroke red flag completion  RED ON EMMI ALERT Day # 13 Date: Thursday 10/04/19 1000 Red Alert Reason: Problems refilling medications? Yes   [facility] RN CM spoke with Selena Branch on 10/04/19 after this Red Alert  With outreach Selena Branch reports errors in [facility] automated communication and alerts answers being incorrect Selena Branch reports she is doing better and improving in all her health concerns She denies need of assist with pill packing,  Plans Case closure for this HTA EMMI stroke patient [facility] RN CM closed case on 10/04/19 as patient has been assessed and no needs identified/needs resolved.   Sent email information to HTA staff ([email or web address] Pt encouraged to return a call to [facility] RN CM prn   Selena Branch L. Lavina Hamman, RN, BSN, [place] Coordinator Office number [phone] Mobile number [phone]  Main [facility] number [phone] Fax number [phone]

## 2019-10-05 NOTE — Progress Notes (Signed)
Okay to treat today with plts 6, per Dr. Irene Limbo.

## 2019-10-05 NOTE — Progress Notes (Signed)
RN was about to hang 2nd bag of platelets. Noticed issue time of 1144. RN called blood bank and walked platelets back to blood back. Blood bank manager approved of proceeding with platelet infusion after examining bag of platelets.

## 2019-10-05 NOTE — Progress Notes (Signed)
HEMATOLOGY/ONCOLOGY CLINIC NOTE  Date of Service: 10/05/2019  Patient Care Team: [name], MD as PCP - General (Family Medicine) [name], MD as Consulting Physician (Hematology)  CHIEF COMPLAINTS/PURPOSE OF CONSULTATION:  CLL Cycle 3 Rituxan  HISTORY OF PRESENTING ILLNESS:   Selena Branch is a wonderful 79 y.o. female who has been transferred to [place] from Dr. Mathis Dad Higgs for evaluation and management of CLL. The pt reports that she is doing well overall.  The pt reports that has been slightly fatigued in the past 1-2 months, but thinks it is from the heat. The fatigue does not affect her ability to function. Denies fevers, chills, and night sweats. She reports that she saw her PCP in August, who noticed that her blood counts were high and referred her to Dr. Irene Limbo.  Lab results today (05/31/2019) of CBC w diff and CMP is as follows: all values are WNL except for WBC at 39.1k, neutro abs at 1.6k, lymphs abs at 36.8k, monocytes abs at 0.0k, eosinophils abs at 0.8k, BUN at 27, Total protein at 6.4 05/31/2019 LDH is pending  On review of systems, pt reports mild fatigue and denies belly pain, infection issues and any other symptoms.   INTERVAL HISTORY:  Selena Branch is a 79 y.o. 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 She is here for C2 of Rituxan. The patient's last visit with [place] was on 09/28/2019. The pt reports that she is doing well overall.  The pt reports she feels the same as she did during her last visit.  She has not noticed much change  She has noticed that her blood pressure has been harder to get under control. At home her highest as be 155/65  The prednisone has increased her appetite   She is taking amlodipine and lasix. She spoke to her pcp about increases Lasix but the doctor did not want to increase it at this time.   Lab results today (10/05/19) of CBC w/diff and CMP is as follows: all values are WNL except for WBC at 23.5, Platelet  count at 6, Glucose Bld at 122, LDH at 204,   On review of systems, pt reports eating well and denies bleeding, gum bleeds, headaches, brain issues, abdominal pain, leg swelling  and any other symptoms.   MEDICAL HISTORY:  Past Medical History:  Diagnosis Date  . Anxiety   . 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 Glaucoma   . Hyperlipemia   . Hypertension   . Osteoporosis     SURGICAL HISTORY: No past surgical history on file.  SOCIAL HISTORY: Social History   Socioeconomic History  . Marital status: Married    Spouse name: Not on file  . Number of children: 1  . Years of education: HS  . Highest education level: Not on file  Occupational History  . Occupation: Retired  Tobacco Use  . Smoking status: Never Smoker  . Smokeless tobacco: Never Used  Substance and Sexual Activity  . Alcohol use: No    Alcohol/week: 0.0 standard drinks  . Drug use: No  . Sexual activity: Not on file  Other Topics Concern  . Not on file  Social History Narrative   Drinks 2 cups of coffee   Social Determinants of Health   Financial Resource Strain:   . Difficulty of Paying Living Expenses: Not on file  Food Insecurity:   . Worried About Charity fundraiser in the Last Year: Not on file  .  Ran Out of Food in the Last Year: Not on file  Transportation Needs:   . Lack of Transportation (Medical): Not on file  . Lack of Transportation (Non-Medical): Not on file  Physical Activity:   . Days of Exercise per Week: Not on file  . Minutes of Exercise per Session: Not on file  Stress:   . Feeling of Stress : Not on file  Social Connections:   . Frequency of Communication with Friends and Family: Not on file  . Frequency of Social Gatherings with Friends and Family: Not on file  . Attends Religious Services: Not on file  . Active Member of Clubs or Organizations: Not on file  . Attends Archivist Meetings: Not on file  . Marital Status: Not on file  Intimate  Partner Violence:   . Fear of Current or Ex-Partner: Not on file  . Emotionally Abused: Not on file  . Physically Abused: Not on file  . Sexually Abused: Not on file    FAMILY HISTORY: Family History  Problem Relation Age of Onset  . Stroke Mother   . Lung cancer Father   . Leukemia Neg Hx     ALLERGIES:  is allergic to codeine.  MEDICATIONS:  Current Outpatient Medications  Medication Sig Dispense Refill  . amLODipine (NORVASC) 10 MG tablet Take 1 tablet (10 mg total) by mouth at bedtime. 30 tablet 0  . atorvastatin (LIPITOR) 40 MG tablet Take 40 mg by mouth daily.     . Brinzolamide-Brimonidine (SIMBRINZA) 1-0.2 % SUSP Place 1 drop into both eyes daily at 12 noon.    . Calcium Carbonate-Vitamin D (CALCIUM-D PO) Take 1 tablet by mouth daily.    . Cholecalciferol (VITAMIN D3) 2000 units TABS Take 2,000 Units by mouth daily.     . dorzolamide-timolol (COSOPT) 22.3-6.8 MG/ML ophthalmic solution Place 1 drop into both eyes daily.     . furosemide (LASIX) 20 MG tablet Take 40 mg for 3 days then resume your 20 mg daily (Patient taking differently: Take 20 mg by mouth daily. ) 30 tablet   . latanoprost (XALATAN) 0.005 % ophthalmic solution Place 1 drop into both eyes at bedtime.     . Multiple Vitamin (MULTIVITAMIN WITH MINERALS) TABS tablet Take 1 tablet by mouth daily.    . predniSONE (DELTASONE) 10 MG tablet Take 4 tablets (40 mg total) by mouth daily with breakfast for 7 days, THEN 3 tablets (30 mg total) daily with breakfast for 7 days, THEN 2 tablets (20 mg total) daily with breakfast for 7 days, THEN 1 tablet (10 mg total) daily with breakfast for 10 days. 73 tablet 0  . quinapril (ACCUPRIL) 40 MG tablet Take 40 mg by mouth daily.     . sertraline (ZOLOFT) 25 MG tablet Take 25 mg by mouth daily.      No current facility-administered medications for this visit.    REVIEW OF SYSTEMS:   A 10+ POINT REVIEW OF SYSTEMS WAS OBTAINED including neurology, dermatology, psychiatry,  cardiac, respiratory, lymph, extremities, GI, GU, Musculoskeletal, constitutional, breasts, reproductive, HEENT.  All pertinent positives are noted in the HPI.  All others are negative.     PHYSICAL EXAMINATION: ECOG FS:2 - Symptomatic, <50% confined to bed  Vitals:   10/05/19 1005 10/05/19 1006  BP: (!) 186/67 (!) 177/66  Pulse: 76   Resp: 16   Temp: 98.7 F (37.1 C)   SpO2: 98%    Wt Readings from Last 3 Encounters:  10/05/19 102 lb  4.8 oz (46.4 kg)  09/28/19 104 lb 6.4 oz (47.4 kg)  09/19/19 114 lb 6.7 oz (51.9 kg)   Body mass index is 21.38 kg/m.    Exam performed in a chair.  GENERAL:alert, in no acute distress and comfortable SKIN: no acute rashes, no significant lesions EYES: conjunctiva are pink and non-injected, sclera anicteric OROPHARYNX: MMM, no exudates, no oropharyngeal erythema or ulceration NECK: supple, no JVD LYMPH:  no palpable lymphadenopathy in the cervical, axillary or inguinal regions LUNGS: clear to auscultation b/l with normal respiratory effort HEART: regular rate & rhythm ABDOMEN:  normoactive bowel sounds , non tender, not distended. Extremity: no pedal edema PSYCH: alert & oriented x 3 with fluent speech NEURO: no focal motor/sensory deficits   LABORATORY DATA:  I have reviewed the data as listed  . CBC Latest Ref Rng & Units 10/05/2019 09/28/2019 09/20/2019  WBC 4.0 - 10.5 K/uL 23.5(H) 20.8(H) 14.8(H)  Hemoglobin 12.0 - 15.0 g/dL 13.8 13.2 10.0(L)  Hematocrit 36.0 - 46.0 % 41.7 40.8 30.4(L)  Platelets 150 - 400 K/uL 6(LL) 151 110(L)    . CMP Latest Ref Rng & Units 10/05/2019 09/28/2019 09/20/2019  Glucose 70 - 99 mg/dL 122(H) 126(H) 108(H)  BUN 8 - 23 mg/dL 23 23 23   Creatinine 0.44 - 1.00 mg/dL 0.78 0.85 0.63  Sodium 135 - 145 mmol/L 138 139 138  Potassium 3.5 - 5.1 mmol/L 3.9 3.8 3.7  Chloride 98 - 111 mmol/L 104 107 109  CO2 22 - 32 mmol/L 24 24 19(L)  Calcium 8.9 - 10.3 mg/dL 9.3 8.7(L) 8.1(L)  Total Protein 6.5 - 8.1 g/dL 8.1  8.6(H) -  Total Bilirubin 0.3 - 1.2 mg/dL 0.7 0.5 -  Alkaline Phos 38 - 126 U/L 59 60 -  AST 15 - 41 U/L 30 30 -  ALT 0 - 44 U/L 30 30 -   02/27/2019 FISH:    RADIOGRAPHIC STUDIES: I have personally reviewed the radiological images as listed and agreed with the findings in the report. CT HEAD WO CONTRAST  Result Date: 09/18/2019 CLINICAL DATA:  Stroke follow-up. EXAM: CT HEAD WITHOUT CONTRAST TECHNIQUE: Contiguous axial images were obtained from the base of the skull through the vertex without intravenous contrast. COMPARISON:  Two days ago FINDINGS: Brain: 20 mm hematoma in the superior and anterior left temporal subcortical lobe with rim of edema. 3 mm hyperdensity along the right posterior frontal cortex. No interval change. No visible infarct, hydrocephalus, or midline shift. Vascular: No hyperdense vessel or unexpected calcification. Skull: Normal. Negative for fracture or focal lesion. Sinuses/Orbits: Unremarkable IMPRESSION: Size stable sites of cerebral hemorrhage. No new abnormality or significant mass effect. Electronically Signed   By: Monte Fantasia M.D.   On: 09/18/2019 04:35   CT HEAD WO CONTRAST  Result Date: 09/16/2019 CLINICAL DATA:  Follow-up intracranial hemorrhage. EXAM: CT HEAD WITHOUT CONTRAST TECHNIQUE: Contiguous axial images were obtained from the base of the skull through the vertex without intravenous contrast. COMPARISON:  Earlier same day.  08/09/2018. FINDINGS: Brain: No change since earlier today. Maximal 21 mm in diameter intraparenchymal hemorrhage in the lateral left temporal lobe with vasogenic edema and a small amount of adjacent subarachnoid blood. Second 3.5 mm hyperdense focus present along the gyral surface of the right frontal parietal junction, axial image 17, also unchanged. Considerable concern for hemorrhagic metastases in this case. No evidence of midline shift. No sign of typical ischemic infarction. No hydrocephalus or extra-axial collection. Vascular:  There is atherosclerotic calcification of the major vessels  at the base of the brain. Skull: Negative Sinuses/Orbits: Clear/normal Other: None IMPRESSION: No change since earlier today. 21 mm hemorrhage or hemorrhagic lesion in the lateral left temporal lobe with surrounding vasogenic edema and a small amount of adjacent subarachnoid hemorrhage. Second 3.5 mm hyperdense focus affecting the gyral surface in the right frontoparietal junction. Presence of this second lesion elevates the likelihood of metastatic disease. This would be an unusual primary hemorrhage. Electronically Signed   By: Nelson Chimes M.D.   On: 09/16/2019 16:50   CT HEAD CODE STROKE WO CONTRAST  Result Date: 09/16/2019 CLINICAL DATA:  Code stroke. Neuro deficit, acute, stroke suspected. Additional history provided: Last known normal 9:50 AM, slurred speech and confusion EXAM: CT HEAD WITHOUT CONTRAST TECHNIQUE: Contiguous axial images were obtained from the base of the skull through the vertex without intravenous contrast. COMPARISON:  CT head 08/09/2018, brain MRI 01/14/2016 FINDINGS: Brain: There is an acute parenchymal hemorrhage centered within the left temporal lobe with mild surrounding edema. The hemorrhage measures 1.6 x 1.6 x 2.0 cm. Trace surrounding subarachnoid hemorrhage. Minimal regional mass effect. No effacement of the ventricular system or midline shift. Additional 3 mm hyperdense focus within the cortex of the right parietal lobe (series 6, image 11) (series 3, image 17). No extra-axial fluid collection. Mild ill-defined hypoattenuation within the cerebral white matter is nonspecific, but consistent with chronic small vessel ischemic disease. Mild generalized parenchymal atrophy. Vascular: No hyperdense vessel.  Atherosclerotic calcifications. Skull: Normal. Negative for fracture or focal lesion. Sinuses/Orbits: Visualized orbits demonstrate no acute abnormality. Other: No significant paranasal sinus disease or mastoid effusion  at the imaged levels. These results were called by telephone at the time of interpretation on 09/16/2019 at 10:32 am to provider Dr. Rory Percy, who verbally acknowledged these results. IMPRESSION: 2 cm left temporal lobe parenchymal hemorrhage with mild surrounding edema. Trace surrounding subarachnoid hemorrhage. Contrast-enhanced brain MRI is recommended once the hematoma involutes to assess for an underlying lesion. Additional 3 mm hyperdense focus within the right parietal cortex. This may reflect an additional tiny parenchymal hemorrhage or other hyperdense lesion. This too should have MRI follow-up. Generalized parenchymal atrophy and chronic small vessel ischemic disease. Electronically Signed   By: Kellie Simmering DO   On: 09/16/2019 10:34    ASSESSMENT & PLAN:   #1 CLL Previous CLL prognostic FISH panel without detectable mutations. Has been Rai stage 0.. Now with new anemia and severe thrombocytopenia. WBC counts have increased significantly to 84k from 39k 3 months ago.  Concerning for change in tempo of her disease. -08/31/2019 CT C/A/P ([id number]) ([id number]) revealed "1. Mild multistation lymphadenopathy in the retroperitoneum and bilateral pelvis as detailed, compatible with lymphoma. Normal size spleen. 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  However cannot rule out bone marrow involvement as an etiology of thrombocytopenia as well.  #3 new mild normocytic anemia.  Hemoglobin 11.7 with an MCV of 97.4. Likely related to her CLL. Elevated LDH level in the 500s would need to rule out autoimmune hemolysis. Haptoglobin pending Coombs test positive for IgG suggesting could be an element of autoimmune hemolysis.  #4 Recent intraparenchymal bleed  PLAN: -Discussed pt labwork today, 10/05/19; all values are WNL except for WBC at 23.5, Platelet count at 6,  Glucose Bld at 122, LDH at 204, PENDING Sample to Blood Bank. -Reviewed medicine list. -Discussed 10/05/19 RBC are at 13.8 -  Advised that we have to complete Rituxan to help reduce CLL but we will also have to add either a targeted treatment or add Romiplostim -Will increase prednisone to 60mg  po daily   -Discussed the option to be admitted as inpatient or to receive 2 units of platelets today and be treated as outpatient. -Will receive platelets today and Rituxan today, followed by additional labs to determine if she should be admitted to the hospital. -Will see if Romiplostim can be approved by insurance to start today    -Advised to holding zoloft until platelets stabilize   FOLLOW UP: 2 units of Platelets today Labs and 2 units of Platelets tomorrow Nplate starting today - weekly Labs and MD visit in 3-4 days Labs and 4th dose of Rituxan in 1 week    The total time spent in the appt was 30 minutes and more than 50% was on counseling and direct patient cares.  All of the patient's questions were answered with apparent satisfaction. The patient knows to call the clinic with any problems, questions or concerns.     Sullivan Lone MD MS AAHIVMS [facility] Hematology/Oncology Physician [facility]  (Office):       [phone] (Work cell):  [phone] (Fax):           [phone]  10/05/2019 12:49 AM  I, Scot Dock, am acting as a scribe for Dr. Sullivan Lone.   .I have reviewed the above documentation for accuracy and completeness, and I agree with the above. [name] MD    ADDENDUM  -started on weekly Nplate 77mcg/kg -received 2 units of platelets -rpt plt counts 150k -setup for labs on 1/25 and platelet transfusion as needed.  [name] MD

## 2019-10-05 NOTE — Patient Instructions (Signed)
[facility] Discharge Instructions for Patients Receiving Chemotherapy  Today you received the following chemotherapy agents Ruxience.  To help prevent nausea and vomiting after your treatment, we encourage you to take your nausea medication.   If you develop nausea and vomiting that is not controlled by your nausea medication, call the clinic.   BELOW ARE SYMPTOMS THAT SHOULD BE REPORTED IMMEDIATELY:  *FEVER GREATER THAN 100.5 F  *CHILLS WITH OR WITHOUT FEVER  NAUSEA AND VOMITING THAT IS NOT CONTROLLED WITH YOUR NAUSEA MEDICATION  *UNUSUAL SHORTNESS OF BREATH  *UNUSUAL BRUISING OR BLEEDING  TENDERNESS IN MOUTH AND THROAT WITH OR WITHOUT PRESENCE OF ULCERS  *URINARY PROBLEMS  *BOWEL PROBLEMS  UNUSUAL RASH Items with * indicate a potential emergency and should be followed up as soon as possible.  Feel free to call the clinic should you have any questions or concerns. The clinic phone number is [phone].  Please show the [place] at check-in to the Emergency Department and triage nurse.  Rituximab injection What is this medicine? RITUXIMAB (ri TUX i mab) is a monoclonal antibody. It is used to treat certain types of cancer like non-Hodgkin lymphoma and chronic lymphocytic leukemia. It is also used to treat rheumatoid arthritis, granulomatosis with polyangiitis (or Wegener's granulomatosis), microscopic polyangiitis, and pemphigus vulgaris. This medicine may be used for other purposes; ask your health care provider or pharmacist if you have questions. COMMON BRAND NAME(S): Rituxan, RUXIENCE What should I tell my health care provider before I take this medicine? They need to know if you have any of these conditions:  heart disease  infection (especially a virus infection such as hepatitis B, chickenpox, cold sores, or herpes)  immune system problems  irregular heartbeat  kidney disease  low blood counts, like low white cell, platelet, or red  cell counts  lung or breathing disease, like asthma  recently received or scheduled to receive a vaccine  an unusual or allergic reaction to rituximab, other medicines, foods, dyes, or preservatives  pregnant or trying to get pregnant  breast-feeding How should I use this medicine? This medicine is for infusion into a vein. It is administered in a hospital or clinic by a specially trained health care professional. A special MedGuide will be given to you by the pharmacist with each prescription and refill. Be sure to read this information carefully each time. Talk to your pediatrician regarding the use of this medicine in children. This medicine is not approved for use in children. Overdosage: If you think you have taken too much of this medicine contact a poison control center or emergency room at once. NOTE: This medicine is only for you. Do not share this medicine with others. What if I miss a dose? It is important not to miss a dose. Call your doctor or health care professional if you are unable to keep an appointment. What may interact with this medicine?  cisplatin  live virus vaccines This list may not describe all possible interactions. Give your health care provider a list of all the medicines, herbs, non-prescription drugs, or dietary supplements you use. Also tell them if you smoke, drink alcohol, or use illegal drugs. Some items may interact with your medicine. What should I watch for while using this medicine? Your condition will be monitored carefully while you are receiving this medicine. You may need blood work done while you are taking this medicine. This medicine can cause serious allergic reactions. To reduce your risk you may need to  take medicine before treatment with this medicine. Take your medicine as directed. In some patients, this medicine may cause a serious brain infection that may cause death. If you have any problems seeing, thinking, speaking, walking, or  standing, tell your healthcare professional right away. If you cannot reach your healthcare professional, urgently seek other source of medical care. Call your doctor or health care professional for advice if you get a fever, chills or sore throat, or other symptoms of a cold or flu. Do not treat yourself. This drug decreases your body's ability to fight infections. Try to avoid being around people who are sick. Do not become pregnant while taking this medicine or for at least 12 months after stopping it. Women should inform their doctor if they wish to become pregnant or think they might be pregnant. There is a potential for serious side effects to an unborn child. Talk to your health care professional or pharmacist for more information. Do not breast-feed an infant while taking this medicine or for at least 6 months after stopping it. What side effects may I notice from receiving this medicine? Side effects that you should report to your doctor or health care professional as soon as possible:  allergic reactions like skin rash, itching or hives; swelling of the face, lips, or tongue  breathing problems  chest pain  changes in vision  diarrhea  headache with fever, neck stiffness, sensitivity to light, nausea, or confusion  fast, irregular heartbeat  loss of memory  low blood counts - this medicine may decrease the number of white blood cells, red blood cells and platelets. You may be at increased risk for infections and bleeding.  mouth sores  problems with balance, talking, or walking  redness, blistering, peeling or loosening of the skin, including inside the mouth  signs of infection - fever or chills, cough, sore throat, pain or difficulty passing urine  signs and symptoms of kidney injury like trouble passing urine or change in the amount of urine  signs and symptoms of liver injury like dark yellow or brown urine; general ill feeling or flu-like symptoms; light-colored  stools; loss of appetite; nausea; right upper belly pain; unusually weak or tired; yellowing of the eyes or skin  signs and symptoms of low blood pressure like dizziness; feeling faint or lightheaded, falls; unusually weak or tired  stomach pain  swelling of the ankles, feet, hands  unusual bleeding or bruising  vomiting Side effects that usually do not require medical attention (report to your doctor or health care professional if they continue or are bothersome):  headache  joint pain  muscle cramps or muscle pain  nausea  tiredness This list may not describe all possible side effects. Call your doctor for medical advice about side effects. You may report side effects to FDA at 1-800-FDA-1088. Where should I keep my medicine? This drug is given in a hospital or clinic and will not be stored at home. NOTE: This sheet is a summary. It may not cover all possible information. If you have questions about this medicine, talk to your doctor, pharmacist, or health care provider.  2020 Elsevier/Gold Standard (2018-10-11 22:01:36)  Blood Transfusion, Adult, Care After This sheet gives you information about how to care for yourself after your procedure. Your doctor may also give you more specific instructions. If you have problems or questions, contact your doctor. What can I expect after the procedure? After the procedure, it is common to have:  Bruising and soreness at  the IV site.  A fever or chills on the day of the procedure. This may be your body's response to the new blood cells received.  A headache. Follow these instructions at home: Insertion site care      Follow instructions from your doctor about how to take care of your insertion site. This is where an IV tube was put into your vein. Make sure you: ? Wash your hands with soap and water before and after you change your bandage (dressing). If you cannot use soap and water, use hand sanitizer. ? Change your bandage as  told by your doctor.  Check your insertion site every day for signs of infection. Check for: ? Redness, swelling, or pain. ? Bleeding from the site. ? Warmth. ? Pus or a bad smell. General instructions  Take over-the-counter and prescription medicines only as told by your doctor.  Rest as told by your doctor.  Go back to your normal activities as told by your doctor.  Keep all follow-up visits as told by your doctor. This is important. Contact a doctor if:  You have itching or red, swollen areas of skin (hives).  You feel worried or nervous (anxious).  You feel weak after doing your normal activities.  You have redness, swelling, warmth, or pain around the insertion site.  You have blood coming from the insertion site, and the blood does not stop with pressure.  You have pus or a bad smell coming from the insertion site. Get help right away if:  You have signs of a serious reaction. This may be coming from an allergy or the body's defense system (immune system). Signs include: ? Trouble breathing or shortness of breath. ? Swelling of the face or feeling warm (flushed). ? Fever or chills. ? Head, chest, or back pain. ? Dark pee (urine) or blood in the pee. ? Widespread rash. ? Fast heartbeat. ? Feeling dizzy or light-headed. You may receive your blood transfusion in an outpatient setting. If so, you will be told whom to contact to report any reactions. These symptoms may be an emergency. Do not wait to see if the symptoms will go away. Get medical help right away. Call your local emergency services (911 in the [place] Do not drive yourself to the hospital. Summary  Bruising and soreness at the IV site are common.  Check your insertion site every day for signs of infection.  Rest as told by your doctor. Go back to your normal activities as told by your doctor.  Get help right away if you have signs of a serious reaction. This information is not intended to replace  advice given to you by your health care provider. Make sure you discuss any questions you have with your health care provider. Document Revised: 02/22/2019 Document Reviewed: 02/22/2019 Elsevier Patient Education  [place].

## 2019-10-05 NOTE — Telephone Encounter (Signed)
Verbal order per Dr. Irene Limbo given to patient per infusion RN Marzetta Board D: Increase current prednisone to 60 mg daily. She stated this to patient while on phone with this Probation officer. This writer able to hear patient verbalize understanding.

## 2019-10-05 NOTE — Progress Notes (Signed)
Pam/lab called [id number]: Plt 6, hgb 13.8. Dr. Irene Limbo notified [phone]. Appt with pt this AM

## 2019-10-05 NOTE — Progress Notes (Signed)
Nplate dose confirmed w/ Dr. Irene Limbo.  He would like to initiate tx w/ Nplate at 2 mcg/kg starting today and weekly.  Further adjustments to Nplate dose will be based on pts pltc.  Kennith Center, Pharm.D., CPP 10/05/2019@12 :15 PM

## 2019-10-06 LAB — PREPARE PLATELET PHERESIS
Unit division: 0
Unit division: 0

## 2019-10-06 LAB — BPAM PLATELET PHERESIS
Blood Product Expiration Date: 202101242359
Blood Product Expiration Date: 202101252359
ISSUE DATE / TIME: 202101221144
ISSUE DATE / TIME: 202101221144
Unit Type and Rh: 6200
Unit Type and Rh: 7300

## 2019-10-08 ENCOUNTER — Inpatient Hospital Stay: Payer: PPO

## 2019-10-08 ENCOUNTER — Inpatient Hospital Stay (HOSPITAL_BASED_OUTPATIENT_CLINIC_OR_DEPARTMENT_OTHER): Payer: PPO

## 2019-10-08 ENCOUNTER — Other Ambulatory Visit: Payer: Self-pay

## 2019-10-08 DIAGNOSIS — D649 Anemia, unspecified: Secondary | ICD-10-CM

## 2019-10-08 DIAGNOSIS — C9110 Chronic lymphocytic leukemia of B-cell type not having achieved remission: Secondary | ICD-10-CM

## 2019-10-08 DIAGNOSIS — D693 Immune thrombocytopenic purpura: Secondary | ICD-10-CM

## 2019-10-08 DIAGNOSIS — Z5112 Encounter for antineoplastic immunotherapy: Secondary | ICD-10-CM | POA: Diagnosis not present

## 2019-10-08 LAB — CMP (CANCER CENTER ONLY)
ALT: 29 U/L (ref 0–44)
AST: 25 U/L (ref 15–41)
Albumin: 4.1 g/dL (ref 3.5–5.0)
Alkaline Phosphatase: 70 U/L (ref 38–126)
Anion gap: 11 (ref 5–15)
BUN: 20 mg/dL (ref 8–23)
CO2: 27 mmol/L (ref 22–32)
Calcium: 9.8 mg/dL (ref 8.9–10.3)
Chloride: 102 mmol/L (ref 98–111)
Creatinine: 0.83 mg/dL (ref 0.44–1.00)
GFR, Est AFR Am: >60 mL/min (ref >60)
GFR, Estimated: >60 mL/min (ref >60)
Glucose, Bld: 118 mg/dL — ABNORMAL HIGH (ref 70–99)
Potassium: 3.2 mmol/L — ABNORMAL LOW (ref 3.5–5.1)
Sodium: 140 mmol/L (ref 135–145)
Total Bilirubin: 0.8 mg/dL (ref 0.3–1.2)
Total Protein: 8.4 g/dL — ABNORMAL HIGH (ref 6.5–8.1)

## 2019-10-08 LAB — CBC WITH DIFFERENTIAL (CANCER CENTER ONLY)
Abs Immature Granulocytes: 0.09 10*3/uL — ABNORMAL HIGH (ref 0.00–0.07)
Basophils Absolute: 0 10*3/uL (ref 0.0–0.1)
Basophils Relative: 0 %
Eosinophils Absolute: 0 10*3/uL (ref 0.0–0.5)
Eosinophils Relative: 0 %
HCT: 42.5 % (ref 36.0–46.0)
Hemoglobin: 13.8 g/dL (ref 12.0–15.0)
Immature Granulocytes: 0 %
Lymphocytes Relative: 60 %
Lymphs Abs: 14.8 10*3/uL — ABNORMAL HIGH (ref 0.7–4.0)
MCH: 30.6 pg (ref 26.0–34.0)
MCHC: 32.5 g/dL (ref 30.0–36.0)
MCV: 94.2 fL (ref 80.0–100.0)
Monocytes Absolute: 0.4 10*3/uL (ref 0.1–1.0)
Monocytes Relative: 2 %
Neutro Abs: 9.5 10*3/uL — ABNORMAL HIGH (ref 1.7–7.7)
Neutrophils Relative %: 38 %
Platelet Count: 71 10*3/uL — ABNORMAL LOW (ref 150–400)
RBC: 4.51 MIL/uL (ref 3.87–5.11)
RDW: 14.7 % (ref 11.5–15.5)
WBC Count: 24.9 10*3/uL — ABNORMAL HIGH (ref 4.0–10.5)
nRBC: 0 % (ref 0.0–0.2)

## 2019-10-08 LAB — LACTATE DEHYDROGENASE: LDH: 215 U/L — ABNORMAL HIGH (ref 98–192)

## 2019-10-08 LAB — IMMATURE PLATELET FRACTION: Immature Platelet Fraction: 6 % (ref 1.2–8.6)

## 2019-10-08 NOTE — Progress Notes (Signed)
Per MD Irene Limbo pt does not need platelet transfusion today.  Pt made aware, verbalized understanding of d/c tx today and of next appt w/MD.

## 2019-10-11 NOTE — Progress Notes (Signed)
HEMATOLOGY/ONCOLOGY CONSULTATION NOTE  Date of Service: 10/12/2019  Patient Care Team: [name], MD as PCP - General (Family Medicine) [name], MD as Consulting Physician (Hematology)  CHIEF COMPLAINTS/PURPOSE OF CONSULTATION:  CLL  HISTORY OF PRESENTING ILLNESS:   Selena Branch is a wonderful 79 y.o. female who has been transferred to [place] from Dr. Mathis Dad Higgs for evaluation and management of CLL. The pt reports that she is doing well overall.  The pt reports that has been slightly fatigued in the past 1-2 months, but thinks it is from the heat. The fatigue does not affect her ability to function. Denies fevers, chills, and night sweats. She reports that she saw her PCP in August, who noticed that her blood counts were high and referred her to Dr. Irene Limbo.  Lab results today (05/31/2019) of CBC w diff and CMP is as follows: all values are WNL except for WBC at 39.1k, neutro abs at 1.6k, lymphs abs at 36.8k, monocytes abs at 0.0k, eosinophils abs at 0.8k, BUN at 27, Total protein at 6.4 05/31/2019 LDH is pending  On review of systems, pt reports mild fatigue and denies belly pain, infection issues and any other symptoms.   INTERVAL HISTORY:  Selena Branch is a 79 y.o. 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 She is here for C2 of Rituxan.The patient's last visit with [place] was on 10/08/2019. The pt reports that she is doing well overall.  The pt reports that she is having some confusion today. She is unsure if she has brain bleeding.  She is taking the prednisone in the morning with breakfast and is tolerating it well.  She is still having difficulties falling asleep.  She just wants to monitor her condition and not be admitted to the hospital.   Lab results today (10/12/19) of CBC w/diff and CMP is as follows: all values are WNL except for WBC at 24.3, Neutro Abs at 8.3, Lymphs Abs at 15.4, LDH at 198,Glucose Bld at 119, BUN at 29.  On review of systems,  pt reports some confusion and denies vision issues, headaches, leg swelling, abdominal pain and any other symptoms.    MEDICAL HISTORY:  Past Medical History:  Diagnosis Date  . Anxiety   . 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 Glaucoma   . Hyperlipemia   . Hypertension   . Osteoporosis     SURGICAL HISTORY: No past surgical history on file.  SOCIAL HISTORY: Social History   Socioeconomic History  . Marital status: Married    Spouse name: Not on file  . Number of children: 1  . Years of education: HS  . Highest education level: Not on file  Occupational History  . Occupation: Retired  Tobacco Use  . Smoking status: Never Smoker  . Smokeless tobacco: Never Used  Substance and Sexual Activity  . Alcohol use: No    Alcohol/week: 0.0 standard drinks  . Drug use: No  . Sexual activity: Not on file  Other Topics Concern  . Not on file  Social History Narrative   Drinks 2 cups of coffee   Social Determinants of Health   Financial Resource Strain:   . Difficulty of Paying Living Expenses: Not on file  Food Insecurity:   . Worried About Charity fundraiser in the Last Year: Not on file  . Ran Out of Food in the Last Year: Not on file  Transportation Needs:   . Lack of Transportation (Medical):  Not on file  . Lack of Transportation (Non-Medical): Not on file  Physical Activity:   . Days of Exercise per Week: Not on file  . Minutes of Exercise per Session: Not on file  Stress:   . Feeling of Stress : Not on file  Social Connections:   . Frequency of Communication with Friends and Family: Not on file  . Frequency of Social Gatherings with Friends and Family: Not on file  . Attends Religious Services: Not on file  . Active Member of Clubs or Organizations: Not on file  . Attends Archivist Meetings: Not on file  . Marital Status: Not on file  Intimate Partner Violence:   . Fear of Current or Ex-Partner: Not on file  . Emotionally Abused: Not  on file  . Physically Abused: Not on file  . Sexually Abused: Not on file    FAMILY HISTORY: Family History  Problem Relation Age of Onset  . Stroke Mother   . Lung cancer Father   . Leukemia Neg Hx     ALLERGIES:  is allergic to codeine.  MEDICATIONS:  Current Outpatient Medications  Medication Sig Dispense Refill  . amLODipine (NORVASC) 10 MG tablet Take 1 tablet (10 mg total) by mouth at bedtime. 30 tablet 0  . atorvastatin (LIPITOR) 40 MG tablet Take 40 mg by mouth daily.     . Brinzolamide-Brimonidine (SIMBRINZA) 1-0.2 % SUSP Place 1 drop into both eyes daily at 12 noon.    . Calcium Carbonate-Vitamin D (CALCIUM-D PO) Take 1 tablet by mouth daily.    . Cholecalciferol (VITAMIN D3) 2000 units TABS Take 2,000 Units by mouth daily.     . dorzolamide-timolol (COSOPT) 22.3-6.8 MG/ML ophthalmic solution Place 1 drop into both eyes daily.     . furosemide (LASIX) 20 MG tablet Take 40 mg for 3 days then resume your 20 mg daily (Patient taking differently: Take 20 mg by mouth daily. ) 30 tablet   . latanoprost (XALATAN) 0.005 % ophthalmic solution Place 1 drop into both eyes at bedtime.     . Multiple Vitamin (MULTIVITAMIN WITH MINERALS) TABS tablet Take 1 tablet by mouth daily.    . predniSONE (DELTASONE) 10 MG tablet Take 4 tablets (40 mg total) by mouth daily with breakfast for 7 days, THEN 3 tablets (30 mg total) daily with breakfast for 7 days, THEN 2 tablets (20 mg total) daily with breakfast for 7 days, THEN 1 tablet (10 mg total) daily with breakfast for 10 days. 73 tablet 0  . quinapril (ACCUPRIL) 40 MG tablet Take 40 mg by mouth daily.      No current facility-administered medications for this visit.    REVIEW OF SYSTEMS:   A 10+ POINT REVIEW OF SYSTEMS WAS OBTAINED including neurology, dermatology, psychiatry, cardiac, respiratory, lymph, extremities, GI, GU, Musculoskeletal, constitutional, breasts, reproductive, HEENT.  All pertinent positives are noted in the HPI.  All  others are negative.    PHYSICAL EXAMINATION: ECOG FS:2 - Symptomatic, <50% confined to bed  Vitals:   10/12/19 0940  BP: (!) 161/76  Pulse: (!) 106  Resp: 19  Temp: 98 F (36.7 C)  SpO2: 96%   Wt Readings from Last 3 Encounters:  10/12/19 103 lb (46.7 kg)  10/05/19 102 lb 4.8 oz (46.4 kg)  09/28/19 104 lb 6.4 oz (47.4 kg)   Body mass index is 21.53 kg/m.    GENERAL:alert, in no acute distress and comfortable SKIN: no acute rashes, no significant lesions EYES: conjunctiva  are pink and non-injected, sclera anicteric OROPHARYNX: MMM, no exudates, no oropharyngeal erythema or ulceration NECK: supple, no JVD LYMPH:  no palpable lymphadenopathy in the cervical, axillary or inguinal regions LUNGS: clear to auscultation b/l with normal respiratory effort HEART: regular rate & rhythm ABDOMEN:  normoactive bowel sounds , non tender, not distended. Extremity: no pedal edema PSYCH: alert & oriented x 3 with fluent speech NEURO: no focal motor/sensory deficits   LABORATORY DATA:  I have reviewed the data as listed  . CBC Latest Ref Rng & Units 10/12/2019 10/08/2019 10/05/2019  WBC 4.0 - 10.5 K/uL 24.3(H) 24.9(H) 27.2(H)  Hemoglobin 12.0 - 15.0 g/dL 12.9 13.8 13.3  Hematocrit 36.0 - 46.0 % 39.2 42.5 40.6  Platelets 150 - 400 K/uL 171 71(L) 150    . CMP Latest Ref Rng & Units 10/12/2019 10/08/2019 10/05/2019  Glucose 70 - 99 mg/dL 119(H) 118(H) 122(H)  BUN 8 - 23 mg/dL 29(H) 20 23  Creatinine 0.44 - 1.00 mg/dL 0.83 0.83 0.78  Sodium 135 - 145 mmol/L 138 140 138  Potassium 3.5 - 5.1 mmol/L 4.1 3.2(L) 3.9  Chloride 98 - 111 mmol/L 104 102 104  CO2 22 - 32 mmol/L 26 27 24   Calcium 8.9 - 10.3 mg/dL 9.2 9.8 9.3  Total Protein 6.5 - 8.1 g/dL 7.3 8.4(H) 8.1  Total Bilirubin 0.3 - 1.2 mg/dL 0.7 0.8 0.7  Alkaline Phos 38 - 126 U/L 56 70 59  AST 15 - 41 U/L 24 25 30   ALT 0 - 44 U/L 28 29 30    02/27/2019 FISH:    RADIOGRAPHIC STUDIES: I have personally reviewed the radiological  images as listed and agreed with the findings in the report. CT HEAD WO CONTRAST  Result Date: 09/18/2019 CLINICAL DATA:  Stroke follow-up. EXAM: CT HEAD WITHOUT CONTRAST TECHNIQUE: Contiguous axial images were obtained from the base of the skull through the vertex without intravenous contrast. COMPARISON:  Two days ago FINDINGS: Brain: 20 mm hematoma in the superior and anterior left temporal subcortical lobe with rim of edema. 3 mm hyperdensity along the right posterior frontal cortex. No interval change. No visible infarct, hydrocephalus, or midline shift. Vascular: No hyperdense vessel or unexpected calcification. Skull: Normal. Negative for fracture or focal lesion. Sinuses/Orbits: Unremarkable IMPRESSION: Size stable sites of cerebral hemorrhage. No new abnormality or significant mass effect. Electronically Signed   By: Monte Fantasia M.D.   On: 09/18/2019 04:35   CT HEAD WO CONTRAST  Result Date: 09/16/2019 CLINICAL DATA:  Follow-up intracranial hemorrhage. EXAM: CT HEAD WITHOUT CONTRAST TECHNIQUE: Contiguous axial images were obtained from the base of the skull through the vertex without intravenous contrast. COMPARISON:  Earlier same day.  08/09/2018. FINDINGS: Brain: No change since earlier today. Maximal 21 mm in diameter intraparenchymal hemorrhage in the lateral left temporal lobe with vasogenic edema and a small amount of adjacent subarachnoid blood. Second 3.5 mm hyperdense focus present along the gyral surface of the right frontal parietal junction, axial image 17, also unchanged. Considerable concern for hemorrhagic metastases in this case. No evidence of midline shift. No sign of typical ischemic infarction. No hydrocephalus or extra-axial collection. Vascular: There is atherosclerotic calcification of the major vessels at the base of the brain. Skull: Negative Sinuses/Orbits: Clear/normal Other: None IMPRESSION: No change since earlier today. 21 mm hemorrhage or hemorrhagic lesion in the  lateral left temporal lobe with surrounding vasogenic edema and a small amount of adjacent subarachnoid hemorrhage. Second 3.5 mm hyperdense focus affecting the gyral surface in the  right frontoparietal junction. Presence of this second lesion elevates the likelihood of metastatic disease. This would be an unusual primary hemorrhage. Electronically Signed   By: Nelson Chimes M.D.   On: 09/16/2019 16:50   CT HEAD CODE STROKE WO CONTRAST  Result Date: 09/16/2019 CLINICAL DATA:  Code stroke. Neuro deficit, acute, stroke suspected. Additional history provided: Last known normal 9:50 AM, slurred speech and confusion EXAM: CT HEAD WITHOUT CONTRAST TECHNIQUE: Contiguous axial images were obtained from the base of the skull through the vertex without intravenous contrast. COMPARISON:  CT head 08/09/2018, brain MRI 01/14/2016 FINDINGS: Brain: There is an acute parenchymal hemorrhage centered within the left temporal lobe with mild surrounding edema. The hemorrhage measures 1.6 x 1.6 x 2.0 cm. Trace surrounding subarachnoid hemorrhage. Minimal regional mass effect. No effacement of the ventricular system or midline shift. Additional 3 mm hyperdense focus within the cortex of the right parietal lobe (series 6, image 11) (series 3, image 17). No extra-axial fluid collection. Mild ill-defined hypoattenuation within the cerebral white matter is nonspecific, but consistent with chronic small vessel ischemic disease. Mild generalized parenchymal atrophy. Vascular: No hyperdense vessel.  Atherosclerotic calcifications. Skull: Normal. Negative for fracture or focal lesion. Sinuses/Orbits: Visualized orbits demonstrate no acute abnormality. Other: No significant paranasal sinus disease or mastoid effusion at the imaged levels. These results were called by telephone at the time of interpretation on 09/16/2019 at 10:32 am to provider Dr. Rory Percy, who verbally acknowledged these results. IMPRESSION: 2 cm left temporal lobe parenchymal  hemorrhage with mild surrounding edema. Trace surrounding subarachnoid hemorrhage. Contrast-enhanced brain MRI is recommended once the hematoma involutes to assess for an underlying lesion. Additional 3 mm hyperdense focus within the right parietal cortex. This may reflect an additional tiny parenchymal hemorrhage or other hyperdense lesion. This too should have MRI follow-up. Generalized parenchymal atrophy and chronic small vessel ischemic disease. Electronically Signed   By: Kellie Simmering DO   On: 09/16/2019 10:34    ASSESSMENT & PLAN:   #1 CLL Previous CLL prognostic FISH panel without detectable mutations. Has been Rai stage 0.. Now with new anemia and severe thrombocytopenia. WBC counts have increased significantly to 84k from 39k 3 months ago.  Concerning for change in tempo of her disease. -08/31/2019 CT C/A/P ([id number]) ([id number]) revealed "1. Mild multistation lymphadenopathy in the retroperitoneum and bilateral pelvis as detailed, compatible with lymphoma. Normal size spleen. 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  However cannot rule out bone marrow involvement as an etiology of thrombocytopenia as well.  #3 new mild normocytic anemia.  Hemoglobin 11.7 with an MCV of 97.4. Likely related to her CLL. Elevated LDH level in the 500s would need to rule out autoimmune hemolysis. Haptoglobin pending Coombs test positive for IgG suggesting could be an element of autoimmune hemolysis.  #4 Recent intraparenchymal bleed  PLAN: -Discussed pt labwork today, 10/12/19; all values are WNL except for WBC at 24.3, Neutro Abs at 8.3, Lymphs Abs at 15.4, LDH at 198,Glucose Bld at 119, BUN at 29. -Discussed that her platelets are at 171 and that she does not need to be admitted to he hospital today  -Discussed starting Venetoclax targeted treatment    -Recommended following up with PCP for blood pressure concerns  -Discussed her blood pressure at home using her blood pressure monitor. -Discussed COVID-19 vaccine and recommended she receive it.  -Recommended she consume  appropriate water  -Will start reducing prednisone to 40 mg a day  -Will continue cycle 4, day one of Rituxan today 10/12/19   -Will receive Nplate 87mcg/kg today, 10/12/19  -Will continue weekly labs with Nplate as needed.   FOLLOW UP: Please schedule for weekly labs and Nplate-next 4 doses MD visit in 1 week and 3 weeks  The total time spent in the appt was 30 minutes and more than 50% was on counseling and direct patient cares.  All of the patient's questions were answered with apparent satisfaction. The patient knows to call the clinic with any problems, questions or concerns.     Sullivan Lone MD MS AAHIVMS [facility] Hematology/Oncology Physician [facility]  (Office):       [phone] (Work cell):  [phone] (Fax):           [phone]  10/11/2019 10:31 PM  I, Scot Dock, am acting as a scribe for Dr. Sullivan Lone.   .I have reviewed the above documentation for accuracy and completeness, and I agree with the above. [name] MD     ADDENDUM  Subsequently calculation/counting of her prednisone prescription revealed patient has taken excessive prednisone resulting in steroid induced mania/some confusion. Daughter was made aware. Steroids were held with a plan to mx thrombocytopenia with Nplate.  [name] MD

## 2019-10-12 ENCOUNTER — Other Ambulatory Visit: Payer: Self-pay

## 2019-10-12 ENCOUNTER — Inpatient Hospital Stay (HOSPITAL_BASED_OUTPATIENT_CLINIC_OR_DEPARTMENT_OTHER): Payer: PPO | Admitting: Hematology

## 2019-10-12 ENCOUNTER — Inpatient Hospital Stay: Payer: PPO

## 2019-10-12 VITALS — BP 161/87 | HR 99 | Temp 98.3°F | Resp 16

## 2019-10-12 VITALS — BP 161/76 | HR 106 | Temp 98.0°F | Resp 19 | Ht <= 58 in | Wt 103.0 lb

## 2019-10-12 DIAGNOSIS — R410 Disorientation, unspecified: Secondary | ICD-10-CM

## 2019-10-12 DIAGNOSIS — D693 Immune thrombocytopenic purpura: Secondary | ICD-10-CM

## 2019-10-12 DIAGNOSIS — Z5112 Encounter for antineoplastic immunotherapy: Secondary | ICD-10-CM | POA: Diagnosis not present

## 2019-10-12 DIAGNOSIS — C9110 Chronic lymphocytic leukemia of B-cell type not having achieved remission: Secondary | ICD-10-CM

## 2019-10-12 DIAGNOSIS — I629 Nontraumatic intracranial hemorrhage, unspecified: Secondary | ICD-10-CM

## 2019-10-12 DIAGNOSIS — D649 Anemia, unspecified: Secondary | ICD-10-CM

## 2019-10-12 LAB — CBC WITH DIFFERENTIAL/PLATELET
Abs Immature Granulocytes: 0.06 10*3/uL (ref 0.00–0.07)
Basophils Absolute: 0 10*3/uL (ref 0.0–0.1)
Basophils Relative: 0 %
Eosinophils Absolute: 0.1 10*3/uL (ref 0.0–0.5)
Eosinophils Relative: 0 %
HCT: 39.2 % (ref 36.0–46.0)
Hemoglobin: 12.9 g/dL (ref 12.0–15.0)
Immature Granulocytes: 0 %
Lymphocytes Relative: 64 %
Lymphs Abs: 15.4 10*3/uL — ABNORMAL HIGH (ref 0.7–4.0)
MCH: 30.7 pg (ref 26.0–34.0)
MCHC: 32.9 g/dL (ref 30.0–36.0)
MCV: 93.3 fL (ref 80.0–100.0)
Monocytes Absolute: 0.4 10*3/uL (ref 0.1–1.0)
Monocytes Relative: 2 %
Neutro Abs: 8.3 10*3/uL — ABNORMAL HIGH (ref 1.7–7.7)
Neutrophils Relative %: 34 %
Platelets: 171 10*3/uL (ref 150–400)
RBC: 4.2 MIL/uL (ref 3.87–5.11)
RDW: 14.8 % (ref 11.5–15.5)
WBC: 24.3 10*3/uL — ABNORMAL HIGH (ref 4.0–10.5)
nRBC: 0 % (ref 0.0–0.2)

## 2019-10-12 LAB — CMP (CANCER CENTER ONLY)
ALT: 28 U/L (ref 0–44)
AST: 24 U/L (ref 15–41)
Albumin: 3.9 g/dL (ref 3.5–5.0)
Alkaline Phosphatase: 56 U/L (ref 38–126)
Anion gap: 8 (ref 5–15)
BUN: 29 mg/dL — ABNORMAL HIGH (ref 8–23)
CO2: 26 mmol/L (ref 22–32)
Calcium: 9.2 mg/dL (ref 8.9–10.3)
Chloride: 104 mmol/L (ref 98–111)
Creatinine: 0.83 mg/dL (ref 0.44–1.00)
GFR, Est AFR Am: >60 mL/min (ref >60)
GFR, Estimated: >60 mL/min (ref >60)
Glucose, Bld: 119 mg/dL — ABNORMAL HIGH (ref 70–99)
Potassium: 4.1 mmol/L (ref 3.5–5.1)
Sodium: 138 mmol/L (ref 135–145)
Total Bilirubin: 0.7 mg/dL (ref 0.3–1.2)
Total Protein: 7.3 g/dL (ref 6.5–8.1)

## 2019-10-12 LAB — LACTATE DEHYDROGENASE: LDH: 198 U/L — ABNORMAL HIGH (ref 98–192)

## 2019-10-12 LAB — TYPE AND SCREEN
ABO/RH(D): O POS
Antibody Screen: POSITIVE
DAT, IgG: POSITIVE

## 2019-10-12 MED ORDER — METHYLPREDNISOLONE SODIUM SUCC 125 MG IJ SOLR
INTRAMUSCULAR | Status: AC
  Filled 2019-10-12: qty 2

## 2019-10-12 MED ORDER — ROMIPLOSTIM 250 MCG ~~LOC~~ SOLR
1.0000 ug/kg | SUBCUTANEOUS | Status: DC
  Administered 2019-10-12: 45 ug via SUBCUTANEOUS
  Filled 2019-10-12: qty 0.09

## 2019-10-12 MED ORDER — PREDNISONE 10 MG PO TABS: ORAL_TABLET | ORAL | 0 refills | Status: DC

## 2019-10-12 MED ORDER — DIPHENHYDRAMINE HCL 25 MG PO CAPS
ORAL_CAPSULE | ORAL | Status: AC
  Filled 2019-10-12: qty 2

## 2019-10-12 MED ORDER — DIPHENHYDRAMINE HCL 25 MG PO CAPS
50.0000 mg | ORAL_CAPSULE | Freq: Once | ORAL | Status: AC
  Administered 2019-10-12: 50 mg via ORAL

## 2019-10-12 MED ORDER — ACETAMINOPHEN 325 MG PO TABS
ORAL_TABLET | ORAL | Status: AC
  Filled 2019-10-12: qty 2

## 2019-10-12 MED ORDER — METHYLPREDNISOLONE SODIUM SUCC 125 MG IJ SOLR
125.0000 mg | Freq: Once | INTRAMUSCULAR | Status: AC
  Administered 2019-10-12: 125 mg via INTRAVENOUS

## 2019-10-12 MED ORDER — SODIUM CHLORIDE 0.9 % IV SOLN
375.0000 mg/m2 | Freq: Once | INTRAVENOUS | Status: AC
  Administered 2019-10-12: 500 mg via INTRAVENOUS
  Filled 2019-10-12: qty 50

## 2019-10-12 MED ORDER — SODIUM CHLORIDE 0.9 % IV SOLN
Freq: Once | INTRAVENOUS | Status: AC
  Filled 2019-10-12: qty 250

## 2019-10-12 MED ORDER — ACETAMINOPHEN 325 MG PO TABS
650.0000 mg | ORAL_TABLET | Freq: Once | ORAL | Status: AC
  Administered 2019-10-12: 650 mg via ORAL

## 2019-10-12 NOTE — Patient Instructions (Signed)
[facility] Discharge Instructions for Patients Receiving Chemotherapy  Today you received the following chemotherapy agents Ruxience.  To help prevent nausea and vomiting after your treatment, we encourage you to take your nausea medication.   If you develop nausea and vomiting that is not controlled by your nausea medication, call the clinic.   BELOW ARE SYMPTOMS THAT SHOULD BE REPORTED IMMEDIATELY:  *FEVER GREATER THAN 100.5 F  *CHILLS WITH OR WITHOUT FEVER  NAUSEA AND VOMITING THAT IS NOT CONTROLLED WITH YOUR NAUSEA MEDICATION  *UNUSUAL SHORTNESS OF BREATH  *UNUSUAL BRUISING OR BLEEDING  TENDERNESS IN MOUTH AND THROAT WITH OR WITHOUT PRESENCE OF ULCERS  *URINARY PROBLEMS  *BOWEL PROBLEMS  UNUSUAL RASH Items with * indicate a potential emergency and should be followed up as soon as possible.  Feel free to call the clinic should you have any questions or concerns. The clinic phone number is [phone].  Please show the [facility] at check-in to the Emergency Department and triage nurse.  Rituximab injection What is this medicine? RITUXIMAB (ri TUX i mab) is a monoclonal antibody. It is used to treat certain types of cancer like non-Hodgkin lymphoma and chronic lymphocytic leukemia. It is also used to treat rheumatoid arthritis, granulomatosis with polyangiitis (or Wegener's granulomatosis), microscopic polyangiitis, and pemphigus vulgaris. This medicine may be used for other purposes; ask your health care provider or pharmacist if you have questions. COMMON BRAND NAME(S): Rituxan, RUXIENCE What should I tell my health care provider before I take this medicine? They need to know if you have any of these conditions:  heart disease  infection (especially a virus infection such as hepatitis B, chickenpox, cold sores, or herpes)  immune system problems  irregular heartbeat  kidney disease  low blood counts, like low white cell, platelet, or red  cell counts  lung or breathing disease, like asthma  recently received or scheduled to receive a vaccine  an unusual or allergic reaction to rituximab, other medicines, foods, dyes, or preservatives  pregnant or trying to get pregnant  breast-feeding How should I use this medicine? This medicine is for infusion into a vein. It is administered in a hospital or clinic by a specially trained health care professional. A special MedGuide will be given to you by the pharmacist with each prescription and refill. Be sure to read this information carefully each time. Talk to your pediatrician regarding the use of this medicine in children. This medicine is not approved for use in children. Overdosage: If you think you have taken too much of this medicine contact a poison control center or emergency room at once. NOTE: This medicine is only for you. Do not share this medicine with others. What if I miss a dose? It is important not to miss a dose. Call your doctor or health care professional if you are unable to keep an appointment. What may interact with this medicine?  cisplatin  live virus vaccines This list may not describe all possible interactions. Give your health care provider a list of all the medicines, herbs, non-prescription drugs, or dietary supplements you use. Also tell them if you smoke, drink alcohol, or use illegal drugs. Some items may interact with your medicine. What should I watch for while using this medicine? Your condition will be monitored carefully while you are receiving this medicine. You may need blood work done while you are taking this medicine. This medicine can cause serious allergic reactions. To reduce your risk you may need to  take medicine before treatment with this medicine. Take your medicine as directed. In some patients, this medicine may cause a serious brain infection that may cause death. If you have any problems seeing, thinking, speaking, walking, or  standing, tell your healthcare professional right away. If you cannot reach your healthcare professional, urgently seek other source of medical care. Call your doctor or health care professional for advice if you get a fever, chills or sore throat, or other symptoms of a cold or flu. Do not treat yourself. This drug decreases your body's ability to fight infections. Try to avoid being around people who are sick. Do not become pregnant while taking this medicine or for at least 12 months after stopping it. Women should inform their doctor if they wish to become pregnant or think they might be pregnant. There is a potential for serious side effects to an unborn child. Talk to your health care professional or pharmacist for more information. Do not breast-feed an infant while taking this medicine or for at least 6 months after stopping it. What side effects may I notice from receiving this medicine? Side effects that you should report to your doctor or health care professional as soon as possible:  allergic reactions like skin rash, itching or hives; swelling of the face, lips, or tongue  breathing problems  chest pain  changes in vision  diarrhea  headache with fever, neck stiffness, sensitivity to light, nausea, or confusion  fast, irregular heartbeat  loss of memory  low blood counts - this medicine may decrease the number of white blood cells, red blood cells and platelets. You may be at increased risk for infections and bleeding.  mouth sores  problems with balance, talking, or walking  redness, blistering, peeling or loosening of the skin, including inside the mouth  signs of infection - fever or chills, cough, sore throat, pain or difficulty passing urine  signs and symptoms of kidney injury like trouble passing urine or change in the amount of urine  signs and symptoms of liver injury like dark yellow or brown urine; general ill feeling or flu-like symptoms; light-colored  stools; loss of appetite; nausea; right upper belly pain; unusually weak or tired; yellowing of the eyes or skin  signs and symptoms of low blood pressure like dizziness; feeling faint or lightheaded, falls; unusually weak or tired  stomach pain  swelling of the ankles, feet, hands  unusual bleeding or bruising  vomiting Side effects that usually do not require medical attention (report to your doctor or health care professional if they continue or are bothersome):  headache  joint pain  muscle cramps or muscle pain  nausea  tiredness This list may not describe all possible side effects. Call your doctor for medical advice about side effects. You may report side effects to FDA at 1-800-FDA-1088. Where should I keep my medicine? This drug is given in a hospital or clinic and will not be stored at home. NOTE: This sheet is a summary. It may not cover all possible information. If you have questions about this medicine, talk to your doctor, pharmacist, or health care provider.  2020 Elsevier/Gold Standard (2018-10-11 22:01:36)  Blood Transfusion, Adult, Care After This sheet gives you information about how to care for yourself after your procedure. Your doctor may also give you more specific instructions. If you have problems or questions, contact your doctor. What can I expect after the procedure? After the procedure, it is common to have:  Bruising and soreness at  the IV site.  A fever or chills on the day of the procedure. This may be your body's response to the new blood cells received.  A headache. Follow these instructions at home: Insertion site care      Follow instructions from your doctor about how to take care of your insertion site. This is where an IV tube was put into your vein. Make sure you: ? Wash your hands with soap and water before and after you change your bandage (dressing). If you cannot use soap and water, use hand sanitizer. ? Change your bandage as  told by your doctor.  Check your insertion site every day for signs of infection. Check for: ? Redness, swelling, or pain. ? Bleeding from the site. ? Warmth. ? Pus or a bad smell. General instructions  Take over-the-counter and prescription medicines only as told by your doctor.  Rest as told by your doctor.  Go back to your normal activities as told by your doctor.  Keep all follow-up visits as told by your doctor. This is important. Contact a doctor if:  You have itching or red, swollen areas of skin (hives).  You feel worried or nervous (anxious).  You feel weak after doing your normal activities.  You have redness, swelling, warmth, or pain around the insertion site.  You have blood coming from the insertion site, and the blood does not stop with pressure.  You have pus or a bad smell coming from the insertion site. Get help right away if:  You have signs of a serious reaction. This may be coming from an allergy or the body's defense system (immune system). Signs include: ? Trouble breathing or shortness of breath. ? Swelling of the face or feeling warm (flushed). ? Fever or chills. ? Head, chest, or back pain. ? Dark pee (urine) or blood in the pee. ? Widespread rash. ? Fast heartbeat. ? Feeling dizzy or light-headed. You may receive your blood transfusion in an outpatient setting. If so, you will be told whom to contact to report any reactions. These symptoms may be an emergency. Do not wait to see if the symptoms will go away. Get medical help right away. Call your local emergency services (911 in the [place] Do not drive yourself to the hospital. Summary  Bruising and soreness at the IV site are common.  Check your insertion site every day for signs of infection.  Rest as told by your doctor. Go back to your normal activities as told by your doctor.  Get help right away if you have signs of a serious reaction. This information is not intended to replace  advice given to you by your health care provider. Make sure you discuss any questions you have with your health care provider. Document Revised: 02/22/2019 Document Reviewed: 02/22/2019 Elsevier Patient Education  [place].  Romiplostim injection What is this medicine? ROMIPLOSTIM (roe mi PLOE stim) helps your body make more platelets. This medicine is used to treat low platelets caused by chronic idiopathic thrombocytopenic purpura (ITP). This medicine may be used for other purposes; ask your health care provider or pharmacist if you have questions. COMMON BRAND NAME(S): Nplate What should I tell my health care provider before I take this medicine? They need to know if you have any of these conditions:  bleeding disorders  bone marrow problem, like blood cancer or myelodysplastic syndrome  history of blood clots  liver disease  surgery to remove your spleen  an unusual or allergic  reaction to romiplostim, mannitol, other medicines, foods, dyes, or preservatives  pregnant or trying to get pregnant  breast-feeding How should I use this medicine? This medicine is for injection under the skin. It is given by a health care professional in a hospital or clinic setting. A special MedGuide will be given to you before your injection. Read this information carefully each time. Talk to your pediatrician regarding the use of this medicine in children. While this drug may be prescribed for children as young as 1 year for selected conditions, precautions do apply. Overdosage: If you think you have taken too much of this medicine contact a poison control center or emergency room at once. NOTE: This medicine is only for you. Do not share this medicine with others. What if I miss a dose? It is important not to miss your dose. Call your doctor or health care professional if you are unable to keep an appointment. What may interact with this medicine? Interactions are not expected. This  list may not describe all possible interactions. Give your health care provider a list of all the medicines, herbs, non-prescription drugs, or dietary supplements you use. Also tell them if you smoke, drink alcohol, or use illegal drugs. Some items may interact with your medicine. What should I watch for while using this medicine? Your condition will be monitored carefully while you are receiving this medicine. Visit your prescriber or health care professional for regular checks on your progress and for the needed blood tests. It is important to keep all appointments. What side effects may I notice from receiving this medicine? Side effects that you should report to your doctor or health care professional as soon as possible:  allergic reactions like skin rash, itching or hives, swelling of the face, lips, or tongue  signs and symptoms of bleeding such as bloody or black, tarry stools; red or dark brown urine; spitting up blood or brown material that looks like coffee grounds; red spots on the skin; unusual bruising or bleeding from the eyes, gums, or nose  signs and symptoms of a blood clot such as chest pain; shortness of breath; pain, swelling, or warmth in the leg  signs and symptoms of a stroke like changes in vision; confusion; trouble speaking or understanding; severe headaches; sudden numbness or weakness of the face, arm or leg; trouble walking; dizziness; loss of balance or coordination Side effects that usually do not require medical attention (report to your doctor or health care professional if they continue or are bothersome):  headache  pain in arms and legs  pain in mouth  stomach pain This list may not describe all possible side effects. Call your doctor for medical advice about side effects. You may report side effects to FDA at 1-800-FDA-1088. Where should I keep my medicine? This drug is given in a hospital or clinic and will not be stored at home. NOTE: This sheet is a  summary. It may not cover all possible information. If you have questions about this medicine, talk to your doctor, pharmacist, or health care provider.  2020 Elsevier/Gold Standard (2017-08-29 11:10:55)

## 2019-10-12 NOTE — Patient Instructions (Addendum)
-  Will begin to taper down Prednisone to 40 mg a day. -Will start Venetoclax targeted treatment once approved by insurance  -Will continue cycle 4, day one of Rituxan today 10/12/19  -Will receive Nplate 81mcg/kg today, 10/12/19 -Will continue weekly labs with Nplate as needed.

## 2019-10-15 ENCOUNTER — Inpatient Hospital Stay: Payer: PPO | Attending: Hematology | Admitting: Hematology

## 2019-10-15 ENCOUNTER — Inpatient Hospital Stay: Payer: PPO

## 2019-10-15 ENCOUNTER — Other Ambulatory Visit: Payer: Self-pay | Admitting: *Deleted

## 2019-10-15 VITALS — BP 222/82 | HR 90 | Temp 97.8°F | Resp 18 | Ht <= 58 in | Wt 103.3 lb

## 2019-10-15 DIAGNOSIS — Z79899 Other long term (current) drug therapy: Secondary | ICD-10-CM | POA: Insufficient documentation

## 2019-10-15 DIAGNOSIS — D649 Anemia, unspecified: Secondary | ICD-10-CM | POA: Diagnosis not present

## 2019-10-15 DIAGNOSIS — R634 Abnormal weight loss: Secondary | ICD-10-CM | POA: Diagnosis not present

## 2019-10-15 DIAGNOSIS — E785 Hyperlipidemia, unspecified: Secondary | ICD-10-CM | POA: Diagnosis not present

## 2019-10-15 DIAGNOSIS — D693 Immune thrombocytopenic purpura: Secondary | ICD-10-CM | POA: Diagnosis not present

## 2019-10-15 DIAGNOSIS — H409 Unspecified glaucoma: Secondary | ICD-10-CM | POA: Diagnosis not present

## 2019-10-15 DIAGNOSIS — F419 Anxiety disorder, unspecified: Secondary | ICD-10-CM | POA: Diagnosis not present

## 2019-10-15 DIAGNOSIS — R5383 Other fatigue: Secondary | ICD-10-CM | POA: Diagnosis not present

## 2019-10-15 DIAGNOSIS — C9110 Chronic lymphocytic leukemia of B-cell type not having achieved remission: Secondary | ICD-10-CM

## 2019-10-15 DIAGNOSIS — R7402 Elevation of levels of lactic acid dehydrogenase (LDH): Secondary | ICD-10-CM | POA: Insufficient documentation

## 2019-10-15 DIAGNOSIS — Z8673 Personal history of transient ischemic attack (TIA), and cerebral infarction without residual deficits: Secondary | ICD-10-CM | POA: Diagnosis not present

## 2019-10-15 DIAGNOSIS — Z7952 Long term (current) use of systemic steroids: Secondary | ICD-10-CM | POA: Insufficient documentation

## 2019-10-15 DIAGNOSIS — I10 Essential (primary) hypertension: Secondary | ICD-10-CM | POA: Diagnosis not present

## 2019-10-15 LAB — CMP (CANCER CENTER ONLY)
ALT: 35 U/L (ref 0–44)
AST: 29 U/L (ref 15–41)
Albumin: 4.2 g/dL (ref 3.5–5.0)
Alkaline Phosphatase: 66 U/L (ref 38–126)
Anion gap: 8 (ref 5–15)
BUN: 20 mg/dL (ref 8–23)
CO2: 30 mmol/L (ref 22–32)
Calcium: 9.6 mg/dL (ref 8.9–10.3)
Chloride: 104 mmol/L (ref 98–111)
Creatinine: 0.87 mg/dL (ref 0.44–1.00)
GFR, Est AFR Am: >60 mL/min (ref >60)
GFR, Estimated: >60 mL/min (ref >60)
Glucose, Bld: 126 mg/dL — ABNORMAL HIGH (ref 70–99)
Potassium: 3.7 mmol/L (ref 3.5–5.1)
Sodium: 142 mmol/L (ref 135–145)
Total Bilirubin: 0.8 mg/dL (ref 0.3–1.2)
Total Protein: 7.7 g/dL (ref 6.5–8.1)

## 2019-10-15 LAB — CBC WITH DIFFERENTIAL (CANCER CENTER ONLY)
Abs Immature Granulocytes: 0.07 10*3/uL (ref 0.00–0.07)
Basophils Absolute: 0 10*3/uL (ref 0.0–0.1)
Basophils Relative: 0 %
Eosinophils Absolute: 0 10*3/uL (ref 0.0–0.5)
Eosinophils Relative: 0 %
HCT: 42.1 % (ref 36.0–46.0)
Hemoglobin: 13.6 g/dL (ref 12.0–15.0)
Immature Granulocytes: 0 %
Lymphocytes Relative: 65 %
Lymphs Abs: 15.6 10*3/uL — ABNORMAL HIGH (ref 0.7–4.0)
MCH: 31.3 pg (ref 26.0–34.0)
MCHC: 32.3 g/dL (ref 30.0–36.0)
MCV: 97 fL (ref 80.0–100.0)
Monocytes Absolute: 0.2 10*3/uL (ref 0.1–1.0)
Monocytes Relative: 1 %
Neutro Abs: 8.2 10*3/uL — ABNORMAL HIGH (ref 1.7–7.7)
Neutrophils Relative %: 34 %
Platelet Count: 214 10*3/uL (ref 150–400)
RBC: 4.34 MIL/uL (ref 3.87–5.11)
RDW: 14.7 % (ref 11.5–15.5)
WBC Count: 24.1 10*3/uL — ABNORMAL HIGH (ref 4.0–10.5)
nRBC: 0 % (ref 0.0–0.2)

## 2019-10-15 LAB — URINALYSIS, COMPLETE (UACMP) WITH MICROSCOPIC
Bilirubin Urine: NEGATIVE
Glucose, UA: NEGATIVE mg/dL
Ketones, ur: NEGATIVE mg/dL
Leukocytes,Ua: NEGATIVE
Nitrite: NEGATIVE
Protein, ur: NEGATIVE mg/dL
Specific Gravity, Urine: 1.009 (ref 1.005–1.030)
pH: 7 (ref 5.0–8.0)

## 2019-10-15 NOTE — Patient Instructions (Signed)
-  Will continue Nplate and labs once per week on Fridays  -Will begin Venetoclax in 1-2 weeks (as soon as approved) -Will taper off of Prednisone. Take 40 mg for 1 week, 30 mg for 1 week, 20 mg for 1 week, and 10 mg for 1 week

## 2019-10-15 NOTE — Progress Notes (Signed)
HEMATOLOGY/ONCOLOGY CLINIC NOTE  Date of Service: 10/15/2019  Patient Care Team: [name], MD as PCP - General (Family Medicine) [name], MD as Consulting Physician (Hematology)  CHIEF COMPLAINTS/PURPOSE OF CONSULTATION:  CLL Cycle 3 Rituxan  HISTORY OF PRESENTING ILLNESS:   Selena Branch is a wonderful 79 y.o. female who has been transferred to [place] from Dr. Mathis Dad Higgs for evaluation and management of CLL. The pt reports that she is doing well overall.  The pt reports that has been slightly fatigued in the past 1-2 months, but thinks it is from the heat. The fatigue does not affect her ability to function. Denies fevers, chills, and night sweats. She reports that she saw her PCP in August, who noticed that her blood counts were high and referred her to Dr. Irene Limbo.  Lab results today (05/31/2019) of CBC w diff and CMP is as follows: all values are WNL except for WBC at 39.1k, neutro abs at 1.6k, lymphs abs at 36.8k, monocytes abs at 0.0k, eosinophils abs at 0.8k, BUN at 27, Total protein at 6.4 05/31/2019 LDH is pending  On review of systems, pt reports mild fatigue and denies belly pain, infection issues and any other symptoms.   INTERVAL HISTORY:  Selena Branch is a 79 y.o. 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 We are joined today by pt's husband, Selena Branch. The patient's last visit with [place] was on 10/12/2019. The pt reports that she is doing well overall.  The pt reports that she was confused about what the plan was for her treatment. Pt has not been sleeping well as she is having trouble getting out of the hospital routine. She notes that she has always had issues getting restful sleep. Selena Branch believes that pt has been having some difficulty staying asleep and has been focusing on cleaning the home multiple times per day.   She has no Prednisone at this time. Pt has been monitoring her BP at home and her levels have been holding steady. She notes  that the highest her systolic BP has been is around 150.   Lab results today (10/15/19) of CBC w/diff and CMP is as follows: all values are WNL except for WBC at 24.1K, Neutro Abs at 8.2K, Lymphs Abs at 15.6K, WBC Morphology shows "Variant Lymphs Present", Smudge Cells are "Present", Glucose at 126.  On review of systems, pt reports sleeplessness and denies headaches, change in vision, fevers, chills, leg swelling, abdominal pain and any other symptoms.   MEDICAL HISTORY:  Past Medical History:  Diagnosis Date  . Anxiety   . 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 Glaucoma   . Hyperlipemia   . Hypertension   . Osteoporosis     SURGICAL HISTORY: No past surgical history on file.  SOCIAL HISTORY: Social History   Socioeconomic History  . Marital status: Married    Spouse name: Not on file  . Number of children: 1  . Years of education: HS  . Highest education level: Not on file  Occupational History  . Occupation: Retired  Tobacco Use  . Smoking status: Never Smoker  . Smokeless tobacco: Never Used  Substance and Sexual Activity  . Alcohol use: No    Alcohol/week: 0.0 standard drinks  . Drug use: No  . Sexual activity: Not on file  Other Topics Concern  . Not on file  Social History Narrative   Drinks 2 cups of coffee   Social Determinants of Health  Financial Resource Strain:   . Difficulty of Paying Living Expenses: Not on file  Food Insecurity:   . Worried About Charity fundraiser in the Last Year: Not on file  . Ran Out of Food in the Last Year: Not on file  Transportation Needs:   . Lack of Transportation (Medical): Not on file  . Lack of Transportation (Non-Medical): Not on file  Physical Activity:   . Days of Exercise per Week: Not on file  . Minutes of Exercise per Session: Not on file  Stress:   . Feeling of Stress : Not on file  Social Connections:   . Frequency of Communication with Friends and Family: Not on file  . Frequency of  Social Gatherings with Friends and Family: Not on file  . Attends Religious Services: Not on file  . Active Member of Clubs or Organizations: Not on file  . Attends Archivist Meetings: Not on file  . Marital Status: Not on file  Intimate Partner Violence:   . Fear of Current or Ex-Partner: Not on file  . Emotionally Abused: Not on file  . Physically Abused: Not on file  . Sexually Abused: Not on file    FAMILY HISTORY: Family History  Problem Relation Age of Onset  . Stroke Mother   . Lung cancer Father   . Leukemia Neg Hx     ALLERGIES:  is allergic to codeine.  MEDICATIONS:  Current Outpatient Medications  Medication Sig Dispense Refill  . amLODipine (NORVASC) 10 MG tablet Take 1 tablet (10 mg total) by mouth at bedtime. 30 tablet 0  . atorvastatin (LIPITOR) 40 MG tablet Take 40 mg by mouth daily.     . Brinzolamide-Brimonidine (SIMBRINZA) 1-0.2 % SUSP Place 1 drop into both eyes daily at 12 noon.    . Calcium Carbonate-Vitamin D (CALCIUM-D PO) Take 1 tablet by mouth daily.    . Cholecalciferol (VITAMIN D3) 2000 units TABS Take 2,000 Units by mouth daily.     . dorzolamide-timolol (COSOPT) 22.3-6.8 MG/ML ophthalmic solution Place 1 drop into both eyes daily.     . furosemide (LASIX) 20 MG tablet Take 40 mg for 3 days then resume your 20 mg daily (Patient taking differently: Take 20 mg by mouth daily. ) 30 tablet   . latanoprost (XALATAN) 0.005 % ophthalmic solution Place 1 drop into both eyes at bedtime.     . Multiple Vitamin (MULTIVITAMIN WITH MINERALS) TABS tablet Take 1 tablet by mouth daily.    . predniSONE (DELTASONE) 10 MG tablet Take 4 tablets (40 mg total) by mouth daily with breakfast for 14 days, THEN 2 tablets (20 mg total) daily with breakfast for 14 days. 28 tablet 0  . quinapril (ACCUPRIL) 40 MG tablet Take 40 mg by mouth daily.      No current facility-administered medications for this visit.    REVIEW OF SYSTEMS:   A 10+ POINT REVIEW OF SYSTEMS  WAS OBTAINED including neurology, dermatology, psychiatry, cardiac, respiratory, lymph, extremities, GI, GU, Musculoskeletal, constitutional, breasts, reproductive, HEENT.  All pertinent positives are noted in the HPI.  All others are negative.    PHYSICAL EXAMINATION: ECOG FS:2 - Symptomatic, <50% confined to bed  Vitals:   10/15/19 1100  BP: (!) 222/82  Pulse: 90  Resp: 18  Temp: 97.8 F (36.6 C)  SpO2: 97%   Wt Readings from Last 3 Encounters:  10/15/19 103 lb 4.8 oz (46.9 kg)  10/12/19 103 lb (46.7 kg)  10/05/19 102 lb  4.8 oz (46.4 kg)   Body mass index is 21.59 kg/m.    Exam was given in a chair   GENERAL:alert, in no acute distress and comfortable SKIN: no acute rashes, no significant lesions EYES: conjunctiva are pink and non-injected, sclera anicteric OROPHARYNX: MMM, no exudates, no oropharyngeal erythema or ulceration NECK: supple, no JVD LYMPH:  no palpable lymphadenopathy in the cervical, axillary or inguinal regions LUNGS: clear to auscultation b/l with normal respiratory effort HEART: regular rate & rhythm ABDOMEN:  normoactive bowel sounds , non tender, not distended. No palpable hepatosplenomegaly.  Extremity: no pedal edema PSYCH: alert & oriented x 3 with fluent speech NEURO: no focal motor/sensory deficits  LABORATORY DATA:  I have reviewed the data as listed  . CBC Latest Ref Rng & Units 10/15/2019 10/12/2019 10/08/2019  WBC 4.0 - 10.5 K/uL 24.1(H) 24.3(H) 24.9(H)  Hemoglobin 12.0 - 15.0 g/dL 13.6 12.9 13.8  Hematocrit 36.0 - 46.0 % 42.1 39.2 42.5  Platelets 150 - 400 K/uL 214 171 71(L)    . CMP Latest Ref Rng & Units 10/15/2019 10/12/2019 10/08/2019  Glucose 70 - 99 mg/dL 126(H) 119(H) 118(H)  BUN 8 - 23 mg/dL 20 29(H) 20  Creatinine 0.44 - 1.00 mg/dL 0.87 0.83 0.83  Sodium 135 - 145 mmol/L 142 138 140  Potassium 3.5 - 5.1 mmol/L 3.7 4.1 3.2(L)  Chloride 98 - 111 mmol/L 104 104 102  CO2 22 - 32 mmol/L 30 26 27   Calcium 8.9 - 10.3 mg/dL 9.6 9.2  9.8  Total Protein 6.5 - 8.1 g/dL 7.7 7.3 8.4(H)  Total Bilirubin 0.3 - 1.2 mg/dL 0.8 0.7 0.8  Alkaline Phos 38 - 126 U/L 66 56 70  AST 15 - 41 U/L 29 24 25   ALT 0 - 44 U/L 35 28 29   02/27/2019 FISH:    RADIOGRAPHIC STUDIES: I have personally reviewed the radiological images as listed and agreed with the findings in the report. CT HEAD WO CONTRAST  Result Date: 09/18/2019 CLINICAL DATA:  Stroke follow-up. EXAM: CT HEAD WITHOUT CONTRAST TECHNIQUE: Contiguous axial images were obtained from the base of the skull through the vertex without intravenous contrast. COMPARISON:  Two days ago FINDINGS: Brain: 20 mm hematoma in the superior and anterior left temporal subcortical lobe with rim of edema. 3 mm hyperdensity along the right posterior frontal cortex. No interval change. No visible infarct, hydrocephalus, or midline shift. Vascular: No hyperdense vessel or unexpected calcification. Skull: Normal. Negative for fracture or focal lesion. Sinuses/Orbits: Unremarkable IMPRESSION: Size stable sites of cerebral hemorrhage. No new abnormality or significant mass effect. Electronically Signed   By: Monte Fantasia M.D.   On: 09/18/2019 04:35   CT HEAD WO CONTRAST  Result Date: 09/16/2019 CLINICAL DATA:  Follow-up intracranial hemorrhage. EXAM: CT HEAD WITHOUT CONTRAST TECHNIQUE: Contiguous axial images were obtained from the base of the skull through the vertex without intravenous contrast. COMPARISON:  Earlier same day.  08/09/2018. FINDINGS: Brain: No change since earlier today. Maximal 21 mm in diameter intraparenchymal hemorrhage in the lateral left temporal lobe with vasogenic edema and a small amount of adjacent subarachnoid blood. Second 3.5 mm hyperdense focus present along the gyral surface of the right frontal parietal junction, axial image 17, also unchanged. Considerable concern for hemorrhagic metastases in this case. No evidence of midline shift. No sign of typical ischemic infarction. No  hydrocephalus or extra-axial collection. Vascular: There is atherosclerotic calcification of the major vessels at the base of the brain. Skull: Negative Sinuses/Orbits: Clear/normal Other:  None IMPRESSION: No change since earlier today. 21 mm hemorrhage or hemorrhagic lesion in the lateral left temporal lobe with surrounding vasogenic edema and a small amount of adjacent subarachnoid hemorrhage. Second 3.5 mm hyperdense focus affecting the gyral surface in the right frontoparietal junction. Presence of this second lesion elevates the likelihood of metastatic disease. This would be an unusual primary hemorrhage. Electronically Signed   By: Nelson Chimes M.D.   On: 09/16/2019 16:50   CT HEAD CODE STROKE WO CONTRAST  Result Date: 09/16/2019 CLINICAL DATA:  Code stroke. Neuro deficit, acute, stroke suspected. Additional history provided: Last known normal 9:50 AM, slurred speech and confusion EXAM: CT HEAD WITHOUT CONTRAST TECHNIQUE: Contiguous axial images were obtained from the base of the skull through the vertex without intravenous contrast. COMPARISON:  CT head 08/09/2018, brain MRI 01/14/2016 FINDINGS: Brain: There is an acute parenchymal hemorrhage centered within the left temporal lobe with mild surrounding edema. The hemorrhage measures 1.6 x 1.6 x 2.0 cm. Trace surrounding subarachnoid hemorrhage. Minimal regional mass effect. No effacement of the ventricular system or midline shift. Additional 3 mm hyperdense focus within the cortex of the right parietal lobe (series 6, image 11) (series 3, image 17). No extra-axial fluid collection. Mild ill-defined hypoattenuation within the cerebral white matter is nonspecific, but consistent with chronic small vessel ischemic disease. Mild generalized parenchymal atrophy. Vascular: No hyperdense vessel.  Atherosclerotic calcifications. Skull: Normal. Negative for fracture or focal lesion. Sinuses/Orbits: Visualized orbits demonstrate no acute abnormality. Other: No  significant paranasal sinus disease or mastoid effusion at the imaged levels. These results were called by telephone at the time of interpretation on 09/16/2019 at 10:32 am to provider Dr. Rory Percy, who verbally acknowledged these results. IMPRESSION: 2 cm left temporal lobe parenchymal hemorrhage with mild surrounding edema. Trace surrounding subarachnoid hemorrhage. Contrast-enhanced brain MRI is recommended once the hematoma involutes to assess for an underlying lesion. Additional 3 mm hyperdense focus within the right parietal cortex. This may reflect an additional tiny parenchymal hemorrhage or other hyperdense lesion. This too should have MRI follow-up. Generalized parenchymal atrophy and chronic small vessel ischemic disease. Electronically Signed   By: Kellie Simmering DO   On: 09/16/2019 10:34    ASSESSMENT & PLAN:   #1 CLL Previous CLL prognostic FISH panel without detectable mutations. Has been Rai stage 0.. Now with new anemia and severe thrombocytopenia. WBC counts have increased significantly to 84k from 39k 3 months ago.  Concerning for change in tempo of her disease. -08/31/2019 CT C/A/P ([id number]) ([id number]) revealed "1. Mild multistation lymphadenopathy in the retroperitoneum and bilateral pelvis as detailed, compatible with lymphoma. Normal size spleen. 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  However cannot rule out bone marrow involvement as an etiology of thrombocytopenia as well.  #3 new mild normocytic anemia.  Hemoglobin 11.7 with an MCV of 97.4. Likely related to her CLL. Elevated LDH level in the 500s would need to rule out autoimmune hemolysis. Haptoglobin pending Coombs test positive for IgG suggesting could be an element of autoimmune hemolysis.  #4 Recent intraparenchymal bleed  PLAN: -Discussed pt labwork today, 10/15/19;  all  values are WNL except for WBC at 24.1K, Neutro Abs at 8.2K, Lymphs Abs at 15.6K, WBC Morphology shows "Variant Lymphs Present", Smudge Cells are "Present", Glucose at 126. -Hesitant to give sedative as it could caused increased confusion - pt agrees  -  Will give 62mcg/kg Nplate once per week - will discontinue when platelets stabilize -Will get labs once per week with Nplate -Will begin Venetoclax in 1-2 weeks (as soon as approved) -holding prednisone since patient has mistakenly overused herprescription and has developed some confusion from steroid overuse. -Advised to hold Zoloft until platelets stabilize  -Offered a repeat CT head - pt declined this option -Will see back on 11/02/2019  FOLLOW UP: -Will give Nplate q1week -Will get labs q1week -RTC with labs with Dr. Irene Limbo on 11/02/2019  The total time spent in the appt was 20 minutes and more than 50% was on counseling and direct patient cares.  All of the patient's questions were answered with apparent satisfaction. The patient knows to call the clinic with any problems, questions or concerns.    Sullivan Lone MD [place] AAHIVMS [facility] Hematology/Oncology Physician [facility]  (Office):       [phone] (Work cell):  [phone] (Fax):           [phone]  10/15/2019 12:40 PM  I, Yevette Edwards, am acting as a scribe for Dr. Sullivan Lone.   .I have reviewed the above documentation for accuracy and completeness, and I agree with the above. [name] MD

## 2019-10-16 ENCOUNTER — Telehealth: Payer: Self-pay | Admitting: *Deleted

## 2019-10-16 ENCOUNTER — Telehealth: Payer: Self-pay | Admitting: Hematology

## 2019-10-16 LAB — URINE CULTURE: Culture: NO GROWTH

## 2019-10-16 NOTE — Telephone Encounter (Signed)
Scheduled appt per 2/1 sch message - pt aware of appt date and time

## 2019-10-16 NOTE — Telephone Encounter (Signed)
Contacted by Shelton Silvas at [facility]. Patient asked for refill of prednisone, they do not have active prescription for patient. Per Dr. Irene Limbo - prescription not sent yesterday, directions given on how to take last 2 weeks of current prescription given to patient. Contacted patient to clarify how many prednisone tablets she had left (original prescription on 09/28/19 was for 4 weeks supply/ taper). Patient states she has no more prednisone left: "I'm holding the bottle and it is empty". She states she does not remember everything she has done, she feels "crazy" right now. Dr. Irene Limbo informed. Per Dr. Irene Limbo, contact patient and inform that no further prescriptions for prednisone to be given at this time. Patient will receive Nplate as scheduled and platelet counts monitored with lab tests.  Patient contacted with information as per Dr. Irene Limbo. Patient clarified information several times and verbalized understanding. Encouraged to contact office for any questions.

## 2019-10-19 ENCOUNTER — Telehealth: Payer: Self-pay | Admitting: *Deleted

## 2019-10-19 ENCOUNTER — Inpatient Hospital Stay: Payer: PPO

## 2019-10-19 ENCOUNTER — Telehealth: Payer: Self-pay | Admitting: Hematology

## 2019-10-19 ENCOUNTER — Other Ambulatory Visit: Payer: Self-pay

## 2019-10-19 VITALS — BP 191/67 | HR 107 | Temp 99.1°F | Resp 18

## 2019-10-19 DIAGNOSIS — I629 Nontraumatic intracranial hemorrhage, unspecified: Secondary | ICD-10-CM

## 2019-10-19 DIAGNOSIS — C9110 Chronic lymphocytic leukemia of B-cell type not having achieved remission: Secondary | ICD-10-CM | POA: Diagnosis not present

## 2019-10-19 DIAGNOSIS — D693 Immune thrombocytopenic purpura: Secondary | ICD-10-CM

## 2019-10-19 LAB — CBC WITH DIFFERENTIAL (CANCER CENTER ONLY)
Abs Immature Granulocytes: 0.02 10*3/uL (ref 0.00–0.07)
Basophils Absolute: 0 10*3/uL (ref 0.0–0.1)
Basophils Relative: 0 %
Eosinophils Absolute: 0.2 10*3/uL (ref 0.0–0.5)
Eosinophils Relative: 1 %
HCT: 42.5 % (ref 36.0–46.0)
Hemoglobin: 13.5 g/dL (ref 12.0–15.0)
Immature Granulocytes: 0 %
Lymphocytes Relative: 52 %
Lymphs Abs: 6.2 10*3/uL — ABNORMAL HIGH (ref 0.7–4.0)
MCH: 30.8 pg (ref 26.0–34.0)
MCHC: 31.8 g/dL (ref 30.0–36.0)
MCV: 96.8 fL (ref 80.0–100.0)
Monocytes Absolute: 0.3 10*3/uL (ref 0.1–1.0)
Monocytes Relative: 2 %
Neutro Abs: 5.5 10*3/uL (ref 1.7–7.7)
Neutrophils Relative %: 45 %
Platelet Count: 10 10*3/uL — ABNORMAL LOW (ref 150–400)
RBC: 4.39 MIL/uL (ref 3.87–5.11)
RDW: 14.7 % (ref 11.5–15.5)
WBC Count: 12.3 10*3/uL — ABNORMAL HIGH (ref 4.0–10.5)
nRBC: 0 % (ref 0.0–0.2)

## 2019-10-19 MED ORDER — ROMIPLOSTIM 250 MCG ~~LOC~~ SOLR
2.0000 ug/kg | SUBCUTANEOUS | Status: DC
  Administered 2019-10-19: 95 ug via SUBCUTANEOUS
  Filled 2019-10-19: qty 0.19

## 2019-10-19 NOTE — Telephone Encounter (Signed)
Scheduled per 02/02 los, called and spoke with patient's husband. Calender will be mailed.

## 2019-10-19 NOTE — Patient Instructions (Signed)
Romiplostim injection What is this medicine? ROMIPLOSTIM (roe mi PLOE stim) helps your body make more platelets. This medicine is used to treat low platelets caused by chronic idiopathic thrombocytopenic purpura (ITP). This medicine may be used for other purposes; ask your health care provider or pharmacist if you have questions. COMMON BRAND NAME(S): Nplate What should I tell my health care provider before I take this medicine? They need to know if you have any of these conditions:  bleeding disorders  bone marrow problem, like blood cancer or myelodysplastic syndrome  history of blood clots  liver disease  surgery to remove your spleen  an unusual or allergic reaction to romiplostim, mannitol, other medicines, foods, dyes, or preservatives  pregnant or trying to get pregnant  breast-feeding How should I use this medicine? This medicine is for injection under the skin. It is given by a health care professional in a hospital or clinic setting. A special MedGuide will be given to you before your injection. Read this information carefully each time. Talk to your pediatrician regarding the use of this medicine in children. While this drug may be prescribed for children as young as 1 year for selected conditions, precautions do apply. Overdosage: If you think you have taken too much of this medicine contact a poison control center or emergency room at once. NOTE: This medicine is only for you. Do not share this medicine with others. What if I miss a dose? It is important not to miss your dose. Call your doctor or health care professional if you are unable to keep an appointment. What may interact with this medicine? Interactions are not expected. This list may not describe all possible interactions. Give your health care provider a list of all the medicines, herbs, non-prescription drugs, or dietary supplements you use. Also tell them if you smoke, drink alcohol, or use illegal drugs.  Some items may interact with your medicine. What should I watch for while using this medicine? Your condition will be monitored carefully while you are receiving this medicine. Visit your prescriber or health care professional for regular checks on your progress and for the needed blood tests. It is important to keep all appointments. What side effects may I notice from receiving this medicine? Side effects that you should report to your doctor or health care professional as soon as possible:  allergic reactions like skin rash, itching or hives, swelling of the face, lips, or tongue  signs and symptoms of bleeding such as bloody or black, tarry stools; red or dark brown urine; spitting up blood or brown material that looks like coffee grounds; red spots on the skin; unusual bruising or bleeding from the eyes, gums, or nose  signs and symptoms of a blood clot such as chest pain; shortness of breath; pain, swelling, or warmth in the leg  signs and symptoms of a stroke like changes in vision; confusion; trouble speaking or understanding; severe headaches; sudden numbness or weakness of the face, arm or leg; trouble walking; dizziness; loss of balance or coordination Side effects that usually do not require medical attention (report to your doctor or health care professional if they continue or are bothersome):  headache  pain in arms and legs  pain in mouth  stomach pain This list may not describe all possible side effects. Call your doctor for medical advice about side effects. You may report side effects to FDA at 1-800-FDA-1088. Where should I keep my medicine? This drug is given in a hospital or clinic   and will not be stored at home. NOTE: This sheet is a summary. It may not cover all possible information. If you have questions about this medicine, talk to your doctor, pharmacist, or health care provider.  2020 Elsevier/Gold Standard (2017-08-29 11:10:55)  

## 2019-10-19 NOTE — Telephone Encounter (Signed)
Daughter called - she is concerned about mother's behavior, stating mother is not remembering things and having episodes of anger and aggression. Daughter states it began last Friday, stating her mother has changed dramatically. Daughter states mother is just not herself. Daughter found out mother had taken all prednisone ordered for 4 week taper in first 2 weeks of having prescription. She is concerned about mother taking medications appropriately since she does not remember. Advised that if she is concerned that mother will harm herself or others during weekend, she can bring her to the ED. Can also contact after hours nurse triage to reach oncall MD.  She verbalized understanding.  Daughter wanted to make sure CC staff call her following appts if she cannot come with her mother, as mother does not remember events of appts. (Pt care coordination note entered). Informed her that Dr. Irene Limbo will receive this information.

## 2019-10-26 ENCOUNTER — Other Ambulatory Visit: Payer: Self-pay

## 2019-10-26 ENCOUNTER — Inpatient Hospital Stay: Payer: PPO

## 2019-10-26 VITALS — BP 198/76 | HR 102 | Temp 98.7°F | Resp 18

## 2019-10-26 DIAGNOSIS — D693 Immune thrombocytopenic purpura: Secondary | ICD-10-CM

## 2019-10-26 DIAGNOSIS — D649 Anemia, unspecified: Secondary | ICD-10-CM

## 2019-10-26 DIAGNOSIS — I629 Nontraumatic intracranial hemorrhage, unspecified: Secondary | ICD-10-CM

## 2019-10-26 DIAGNOSIS — C9110 Chronic lymphocytic leukemia of B-cell type not having achieved remission: Secondary | ICD-10-CM

## 2019-10-26 LAB — CBC WITH DIFFERENTIAL/PLATELET
Abs Immature Granulocytes: 0.04 10*3/uL (ref 0.00–0.07)
Basophils Absolute: 0 10*3/uL (ref 0.0–0.1)
Basophils Relative: 0 %
Eosinophils Absolute: 0.6 10*3/uL — ABNORMAL HIGH (ref 0.0–0.5)
Eosinophils Relative: 5 %
HCT: 36.3 % (ref 36.0–46.0)
Hemoglobin: 12 g/dL (ref 12.0–15.0)
Immature Granulocytes: 0 %
Lymphocytes Relative: 56 %
Lymphs Abs: 6.6 10*3/uL — ABNORMAL HIGH (ref 0.7–4.0)
MCH: 30.6 pg (ref 26.0–34.0)
MCHC: 33.1 g/dL (ref 30.0–36.0)
MCV: 92.6 fL (ref 80.0–100.0)
Monocytes Absolute: 0.5 10*3/uL (ref 0.1–1.0)
Monocytes Relative: 4 %
Neutro Abs: 4.2 10*3/uL (ref 1.7–7.7)
Neutrophils Relative %: 35 %
Platelets: 191 10*3/uL (ref 150–400)
RBC: 3.92 MIL/uL (ref 3.87–5.11)
RDW: 14.2 % (ref 11.5–15.5)
WBC: 11.9 10*3/uL — ABNORMAL HIGH (ref 4.0–10.5)
nRBC: 0 % (ref 0.0–0.2)

## 2019-10-26 MED ORDER — ROMIPLOSTIM 250 MCG ~~LOC~~ SOLR
2.0000 ug/kg | SUBCUTANEOUS | Status: DC
  Administered 2019-10-26: 95 ug via SUBCUTANEOUS
  Filled 2019-10-26: qty 0.19

## 2019-10-26 NOTE — Progress Notes (Signed)
Pltc = 191 Nplate dose confirmed w/ Dr. Irene Limbo. Proceed w/ 2 mcg/kg (95 mcg) today.  Kennith Center, Pharm.D., CPP 10/26/2019@11 :25 AM

## 2019-10-26 NOTE — Progress Notes (Signed)
Ok to give per MD. Instructed daughter to contact PCP for BP issues.

## 2019-10-26 NOTE — Patient Instructions (Signed)
Romiplostim injection What is this medicine? ROMIPLOSTIM (roe mi PLOE stim) helps your body make more platelets. This medicine is used to treat low platelets caused by chronic idiopathic thrombocytopenic purpura (ITP). This medicine may be used for other purposes; ask your health care provider or pharmacist if you have questions. COMMON BRAND NAME(S): Nplate What should I tell my health care provider before I take this medicine? They need to know if you have any of these conditions:  bleeding disorders  bone marrow problem, like blood cancer or myelodysplastic syndrome  history of blood clots  liver disease  surgery to remove your spleen  an unusual or allergic reaction to romiplostim, mannitol, other medicines, foods, dyes, or preservatives  pregnant or trying to get pregnant  breast-feeding How should I use this medicine? This medicine is for injection under the skin. It is given by a health care professional in a hospital or clinic setting. A special MedGuide will be given to you before your injection. Read this information carefully each time. Talk to your pediatrician regarding the use of this medicine in children. While this drug may be prescribed for children as young as 1 year for selected conditions, precautions do apply. Overdosage: If you think you have taken too much of this medicine contact a poison control center or emergency room at once. NOTE: This medicine is only for you. Do not share this medicine with others. What if I miss a dose? It is important not to miss your dose. Call your doctor or health care professional if you are unable to keep an appointment. What may interact with this medicine? Interactions are not expected. This list may not describe all possible interactions. Give your health care provider a list of all the medicines, herbs, non-prescription drugs, or dietary supplements you use. Also tell them if you smoke, drink alcohol, or use illegal drugs.  Some items may interact with your medicine. What should I watch for while using this medicine? Your condition will be monitored carefully while you are receiving this medicine. Visit your prescriber or health care professional for regular checks on your progress and for the needed blood tests. It is important to keep all appointments. What side effects may I notice from receiving this medicine? Side effects that you should report to your doctor or health care professional as soon as possible:  allergic reactions like skin rash, itching or hives, swelling of the face, lips, or tongue  signs and symptoms of bleeding such as bloody or black, tarry stools; red or dark brown urine; spitting up blood or brown material that looks like coffee grounds; red spots on the skin; unusual bruising or bleeding from the eyes, gums, or nose  signs and symptoms of a blood clot such as chest pain; shortness of breath; pain, swelling, or warmth in the leg  signs and symptoms of a stroke like changes in vision; confusion; trouble speaking or understanding; severe headaches; sudden numbness or weakness of the face, arm or leg; trouble walking; dizziness; loss of balance or coordination Side effects that usually do not require medical attention (report to your doctor or health care professional if they continue or are bothersome):  headache  pain in arms and legs  pain in mouth  stomach pain This list may not describe all possible side effects. Call your doctor for medical advice about side effects. You may report side effects to FDA at 1-800-FDA-1088. Where should I keep my medicine? This drug is given in a hospital or clinic   and will not be stored at home. NOTE: This sheet is a summary. It may not cover all possible information. If you have questions about this medicine, talk to your doctor, pharmacist, or health care provider.  2020 Elsevier/Gold Standard (2017-08-29 11:10:55)  

## 2019-11-01 ENCOUNTER — Telehealth: Payer: Self-pay | Admitting: Hematology

## 2019-11-01 NOTE — Telephone Encounter (Signed)
Rescheduled 02/19 appointment time to later time per providers request, patient has been called and notified.

## 2019-11-02 ENCOUNTER — Inpatient Hospital Stay: Payer: PPO

## 2019-11-02 ENCOUNTER — Other Ambulatory Visit: Payer: Self-pay

## 2019-11-02 ENCOUNTER — Inpatient Hospital Stay: Payer: PPO | Admitting: Hematology

## 2019-11-02 ENCOUNTER — Inpatient Hospital Stay (HOSPITAL_BASED_OUTPATIENT_CLINIC_OR_DEPARTMENT_OTHER): Payer: PPO | Admitting: Hematology

## 2019-11-02 VITALS — BP 167/70 | HR 93 | Temp 98.9°F | Resp 19 | Ht <= 58 in | Wt 101.6 lb

## 2019-11-02 DIAGNOSIS — D693 Immune thrombocytopenic purpura: Secondary | ICD-10-CM | POA: Diagnosis not present

## 2019-11-02 DIAGNOSIS — R410 Disorientation, unspecified: Secondary | ICD-10-CM

## 2019-11-02 DIAGNOSIS — C9110 Chronic lymphocytic leukemia of B-cell type not having achieved remission: Secondary | ICD-10-CM | POA: Diagnosis not present

## 2019-11-02 DIAGNOSIS — I629 Nontraumatic intracranial hemorrhage, unspecified: Secondary | ICD-10-CM

## 2019-11-02 LAB — CBC WITH DIFFERENTIAL (CANCER CENTER ONLY)
Abs Immature Granulocytes: 0.04 10*3/uL (ref 0.00–0.07)
Basophils Absolute: 0.1 10*3/uL (ref 0.0–0.1)
Basophils Relative: 1 %
Eosinophils Absolute: 0.8 10*3/uL — ABNORMAL HIGH (ref 0.0–0.5)
Eosinophils Relative: 6 %
HCT: 40.9 % (ref 36.0–46.0)
Hemoglobin: 13.3 g/dL (ref 12.0–15.0)
Immature Granulocytes: 0 %
Lymphocytes Relative: 56 %
Lymphs Abs: 6.7 10*3/uL — ABNORMAL HIGH (ref 0.7–4.0)
MCH: 30.3 pg (ref 26.0–34.0)
MCHC: 32.5 g/dL (ref 30.0–36.0)
MCV: 93.2 fL (ref 80.0–100.0)
Monocytes Absolute: 0.8 10*3/uL (ref 0.1–1.0)
Monocytes Relative: 7 %
Neutro Abs: 3.5 10*3/uL (ref 1.7–7.7)
Neutrophils Relative %: 30 %
Platelet Count: 649 10*3/uL — ABNORMAL HIGH (ref 150–400)
RBC: 4.39 MIL/uL (ref 3.87–5.11)
RDW: 13.8 % (ref 11.5–15.5)
WBC Count: 11.8 10*3/uL — ABNORMAL HIGH (ref 4.0–10.5)
nRBC: 0 % (ref 0.0–0.2)

## 2019-11-02 NOTE — Progress Notes (Signed)
HEMATOLOGY/ONCOLOGY CLINIC NOTE  Date of Branch: 11/02/2019  Patient Care Team: [name], MD as PCP - General (Family Medicine) [name], MD as Consulting Physician (Hematology)  CHIEF COMPLAINTS/PURPOSE OF CONSULTATION:  F/u for CLL and ITP  HISTORY OF PRESENTING ILLNESS:   Selena Branch is a wonderful 79 y.o. female who has been transferred to [place] from Dr. Mathis Dad Higgs for evaluation and management of CLL. The pt reports that she is doing well overall.  The pt reports that has been slightly fatigued in the past 1-2 months, but thinks it is from the heat. The fatigue does not affect her ability to function. Denies fevers, chills, and night sweats. She reports that she saw her PCP in August, who noticed that her blood counts were high and referred her to Dr. Irene Limbo.  Lab results today (05/31/2019) of CBC w diff and CMP is as follows: all values are WNL except for WBC at 39.1k, neutro abs at 1.6k, lymphs abs at 36.8k, monocytes abs at 0.0k, eosinophils abs at 0.8k, BUN at 27, Total protein at 6.4 05/31/2019 LDH is pending  On review of systems, pt reports mild fatigue and denies belly pain, infection issues and any other symptoms.   INTERVAL HISTORY:  Selena Branch is a 79 y.o. 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 We are joined today by pt's daughter. The patient's last visit with [place] was on 10/15/2019. The pt reports that she is doing well overall.  The pt reports she misunderstood directions and stopped taking all of her medications for a time. She has since restarted taking her medications. Pt is still waking up early like she was in her hospital routine and is having trouble remembering from 01/06 on. Pt reports sleeping and previous aggression has improved. She is concerned that she has had a repeat stroke due to her having trouble with her memory. Her daughter notes that prior to her stroke pt's mental acuity was very high and her short-term memory was  better. She was not given a Neurology follow-up upon discharge. Pt is not eating as much lately and has been losing weight. She has been using Ensure supplements.   Lab results today (11/02/19) of CBC w/diff and CMP is as follows: all values are WNL except for WBC at 11.8K, Platelet Count at 649K, Lymph's Abs at 6.7K, and Eosinophils Relative 0.8K, WBC Morphology shows "Variant Lymphs".  On review of systems, pt reports having memory issues, chills, abdominal soreness, low appetite and denies new lumps/bumps, fevers, constipation, diarrhea, bleeding concerns, difficulty passing urine, sleeplessness and any other symptoms.   MEDICAL HISTORY:  Past Medical History:  Diagnosis Date  . Anxiety   . 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 Glaucoma   . Hyperlipemia   . Hypertension   . Osteoporosis     SURGICAL HISTORY: No past surgical history on file.  SOCIAL HISTORY: Social History   Socioeconomic History  . Marital status: Married    Spouse name: Not on file  . Number of children: 1  . Years of education: HS  . Highest education level: Not on file  Occupational History  . Occupation: Retired  Tobacco Use  . Smoking status: Never Smoker  . Smokeless tobacco: Never Used  Substance and Sexual Activity  . Alcohol use: No    Alcohol/week: 0.0 standard drinks  . Drug use: No  . Sexual activity: Not on file  Other Topics Concern  . Not on file  Social History Narrative   Drinks 2 cups of coffee   Social Determinants of Health   Financial Resource Strain:   . Difficulty of Paying Living Expenses: Not on file  Food Insecurity:   . Worried About Charity fundraiser in the Last Year: Not on file  . Ran Out of Food in the Last Year: Not on file  Transportation Needs:   . Lack of Transportation (Medical): Not on file  . Lack of Transportation (Non-Medical): Not on file  Physical Activity:   . Days of Exercise per Week: Not on file  . Minutes of Exercise per  Session: Not on file  Stress:   . Feeling of Stress : Not on file  Social Connections:   . Frequency of Communication with Friends and Family: Not on file  . Frequency of Social Gatherings with Friends and Family: Not on file  . Attends Religious Services: Not on file  . Active Member of Clubs or Organizations: Not on file  . Attends Archivist Meetings: Not on file  . Marital Status: Not on file  Intimate Partner Violence:   . Fear of Current or Ex-Partner: Not on file  . Emotionally Abused: Not on file  . Physically Abused: Not on file  . Sexually Abused: Not on file    FAMILY HISTORY: Family History  Problem Relation Age of Onset  . Stroke Mother   . Lung cancer Father   . Leukemia Neg Hx     ALLERGIES:  is allergic to codeine.  MEDICATIONS:  Current Outpatient Medications  Medication Sig Dispense Refill  . amLODipine (NORVASC) 10 MG tablet Take 1 tablet (10 mg total) by mouth at bedtime. 30 tablet 0  . atorvastatin (LIPITOR) 40 MG tablet Take 40 mg by mouth daily.     . Brinzolamide-Brimonidine (SIMBRINZA) 1-0.2 % SUSP Place 1 drop into both eyes daily at 12 noon.    . Calcium Carbonate-Vitamin D (CALCIUM-D PO) Take 1 tablet by mouth daily.    . Cholecalciferol (VITAMIN D3) 2000 units TABS Take 2,000 Units by mouth daily.     . dorzolamide-timolol (COSOPT) 22.3-6.8 MG/ML ophthalmic solution Place 1 drop into both eyes daily.     . furosemide (LASIX) 20 MG tablet Take 40 mg for 3 days then resume your 20 mg daily (Patient taking differently: Take 20 mg by mouth daily. ) 30 tablet   . latanoprost (XALATAN) 0.005 % ophthalmic solution Place 1 drop into both eyes at bedtime.     . Multiple Vitamin (MULTIVITAMIN WITH MINERALS) TABS tablet Take 1 tablet by mouth daily.    . predniSONE (DELTASONE) 10 MG tablet Take 4 tablets (40 mg total) by mouth daily with breakfast for 14 days, THEN 2 tablets (20 mg total) daily with breakfast for 14 days. 28 tablet 0  . quinapril  (ACCUPRIL) 40 MG tablet Take 40 mg by mouth daily.      No current facility-administered medications for this visit.    REVIEW OF SYSTEMS:   A 10+ POINT REVIEW OF SYSTEMS WAS OBTAINED including neurology, dermatology, psychiatry, cardiac, respiratory, lymph, extremities, GI, GU, Musculoskeletal, constitutional, breasts, reproductive, HEENT.  All pertinent positives are noted in the HPI.  All others are negative.   PHYSICAL EXAMINATION: ECOG FS:2 - Symptomatic, <50% confined to bed  Vitals:   11/02/19 1200 11/02/19 1201  BP: (!) 163/66 (!) 167/70  Pulse: 93   Resp: 19   Temp: 98.9 F (37.2 C)   SpO2: 97%  Wt Readings from Last 3 Encounters:  11/02/19 101 lb 9.6 oz (46.1 kg)  10/15/19 103 lb 4.8 oz (46.9 kg)  10/12/19 103 lb (46.7 kg)   Body mass index is 21.23 kg/m.    Exam was given in a chair   GENERAL:alert, in no acute distress and comfortable SKIN: no acute rashes, no significant lesions EYES: conjunctiva are pink and non-injected, sclera anicteric OROPHARYNX: MMM, no exudates, no oropharyngeal erythema or ulceration NECK: supple, no JVD LYMPH:  no palpable lymphadenopathy in the cervical, axillary or inguinal regions LUNGS: clear to auscultation b/l with normal respiratory effort HEART: regular rate & rhythm ABDOMEN:  normoactive bowel sounds , non tender, not distended. No palpable hepatosplenomegaly.  Extremity: no pedal edema PSYCH: alert & oriented x 3 with fluent speech NEURO: no focal motor/sensory deficits  LABORATORY DATA:  I have reviewed the data as listed  . CBC Latest Ref Rng & Units 11/02/2019 10/26/2019 10/19/2019  WBC 4.0 - 10.5 K/uL 11.8(H) 11.9(H) 12.3(H)  Hemoglobin 12.0 - 15.0 g/dL 13.3 12.0 13.5  Hematocrit 36.0 - 46.0 % 40.9 36.3 42.5  Platelets 150 - 400 K/uL 649(H) 191 10(L)    . CMP Latest Ref Rng & Units 10/15/2019 10/12/2019 10/08/2019  Glucose 70 - 99 mg/dL 126(H) 119(H) 118(H)  BUN 8 - 23 mg/dL 20 29(H) 20  Creatinine 0.44 - 1.00  mg/dL 0.87 0.83 0.83  Sodium 135 - 145 mmol/L 142 138 140  Potassium 3.5 - 5.1 mmol/L 3.7 4.1 3.2(L)  Chloride 98 - 111 mmol/L 104 104 102  CO2 22 - 32 mmol/L 30 26 27   Calcium 8.9 - 10.3 mg/dL 9.6 9.2 9.8  Total Protein 6.5 - 8.1 g/dL 7.7 7.3 8.4(H)  Total Bilirubin 0.3 - 1.2 mg/dL 0.8 0.7 0.8  Alkaline Phos 38 - 126 U/L 66 56 70  AST 15 - 41 U/L 29 24 25   ALT 0 - 44 U/L 35 28 29   02/27/2019 FISH:    RADIOGRAPHIC STUDIES: I have personally reviewed the radiological images as listed and agreed with the findings in the report. No results found.  ASSESSMENT & PLAN:   #1 CLL Previous CLL prognostic FISH panel without detectable mutations. Has been Rai stage 0.. Now with new anemia and severe thrombocytopenia. WBC counts have increased significantly to 84k from 39k 3 months ago.  Concerning for change in tempo of her disease. -08/31/2019 CT C/A/P ([id number]) ([id number]) revealed "1. Mild multistation lymphadenopathy in the retroperitoneum and bilateral pelvis as detailed, compatible with lymphoma. Normal size spleen. 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  However cannot rule out bone marrow involvement as an etiology of thrombocytopenia as well.  #3 new mild normocytic anemia.  Hemoglobin 11.7 with an MCV of 97.4. Likely related to her CLL. Elevated LDH level in the 500s would need to rule out autoimmune hemolysis. Haptoglobin pending Coombs test positive for IgG suggesting could be an element of autoimmune hemolysis.  #4 Recent intraparenchymal bleed  PLAN: -Discussed pt labwork today, 11/02/19; PLT have improved, other blood counts are stable -Will hold N Plate today as PLT at 649K. Will continue weekly N Plate.  -Recommend pt sleep at least 6 hours a night, keep a routine, eat meals on time, de-stress, and continue taking medications as  prescribed  -Checking to see if other medical issues being stable  -Advised pt that we would  like to begin targeted therapy (Venetoclax) to continue to treat CLL to keep ITP in remission -Advised pt that we will need to have weekly labs due to changing Venetoclax dosing -Advised pt that her recent short-term memory loss could be caused by an acute confusional state due to her hospital visit or a more significant memory disorder -Do not recommend pt operate a motorvehicle at this time - would need clearance by PCP  -Recommend pt receive COVID19 vaccine when available -Recommended that the pt continue to eat well, drink at least 48-64 oz of water each day, and walk 20-30 minutes each day.  -Recommend pt f/u Dr. Addison Lank for primary care management  -Will defer to Dr. Addison Lank for a Neurology referral for frontal-lobe stroke follow-up -Will get CT Head in 4-7 days  -Will begin Venetoclax ASAP  -Will see back 2 weeks with lab  FOLLOW UP: Please continue weekly labs and NPlate x6 CT head in 3 days RTC with Dr Irene Limbo in 2 weeks   The total time spent in the appt was 30 minutes and more than 50% was on counseling and direct patient cares.  All of the patient's questions were answered with apparent satisfaction. The patient knows to call the clinic with any problems, questions or concerns.    Sullivan Lone MD [place] AAHIVMS [facility] Hematology/Oncology Physician [facility]  (Office):       [phone] (Work cell):  [phone] (Fax):           [phone]  11/02/2019 12:59 PM  I, Yevette Edwards, am acting as a scribe for Dr. Sullivan Lone.   .I have reviewed the above documentation for accuracy and completeness, and I agree with the above. [name] MD

## 2019-11-06 ENCOUNTER — Telehealth: Payer: Self-pay | Admitting: Hematology

## 2019-11-06 MED ORDER — VENETOCLAX 10 & 50 & 100 MG PO TBPK: ORAL_TABLET | ORAL | 0 refills | Status: AC

## 2019-11-06 NOTE — Telephone Encounter (Signed)
Scheduled per 02/19 los, patient has been called and notified.  

## 2019-11-07 ENCOUNTER — Telehealth: Payer: Self-pay | Admitting: Pharmacist

## 2019-11-07 ENCOUNTER — Telehealth: Payer: Self-pay

## 2019-11-07 NOTE — Telephone Encounter (Signed)
Oral Oncology Patient Advocate Encounter  Received notification from [employer] that prior authorization for Venclexta is required.  PA submitted on CoverMyMeds Key [id number] Status is pending  Oral Oncology Clinic will continue to follow.  [place] Patient [place] Phone [phone] Fax [phone] 11/07/2019 9:06 AM

## 2019-11-07 NOTE — Telephone Encounter (Signed)
Oral Oncology Pharmacist Encounter  Received new prescription for Venclexta (venetoclax) for the treatment of CLL.  CMP from 10/15/19 assessed, no relevant lab abnormalities. CBC from 11/02/19 assessed, no relevant lab abnormalities. Repeat lab work and TLS lab moniotring ordered. Prescription dose and frequency assessed.   Current medication list in Epic reviewed, no DDIs with venetoclax identified.  Prescription has been e-scribed to the [facility] for benefits analysis and approval.  Oral Oncology Clinic will continue to follow for insurance authorization, copayment issues, initial counseling and start date.  Darl Pikes, PharmD, BCPS, [facility] Hematology/Oncology Clinical Pharmacist [facility] Oral [facility] [phone]  11/07/2019 9:29 AM

## 2019-11-07 NOTE — Telephone Encounter (Signed)
Oral Oncology Patient Advocate Encounter  Prior Authorization for Selena Branch has been approved.    PA# [id number] Effective dates: 11/07/19 through 11/06/20  Patients co-pay is $45  Oral Oncology Clinic will continue to follow.   [place] Patient [place] Phone [phone] Fax [phone] 11/07/2019 11:35 AM

## 2019-11-09 ENCOUNTER — Inpatient Hospital Stay: Payer: PPO

## 2019-11-09 ENCOUNTER — Other Ambulatory Visit: Payer: Self-pay

## 2019-11-09 ENCOUNTER — Telehealth: Payer: Self-pay | Admitting: Pharmacist

## 2019-11-09 VITALS — BP 159/56 | HR 80 | Temp 98.5°F | Resp 18

## 2019-11-09 DIAGNOSIS — I629 Nontraumatic intracranial hemorrhage, unspecified: Secondary | ICD-10-CM

## 2019-11-09 DIAGNOSIS — D693 Immune thrombocytopenic purpura: Secondary | ICD-10-CM

## 2019-11-09 DIAGNOSIS — C9110 Chronic lymphocytic leukemia of B-cell type not having achieved remission: Secondary | ICD-10-CM

## 2019-11-09 LAB — CBC WITH DIFFERENTIAL/PLATELET
Abs Immature Granulocytes: 0.02 10*3/uL (ref 0.00–0.07)
Basophils Absolute: 0.1 10*3/uL (ref 0.0–0.1)
Basophils Relative: 1 %
Eosinophils Absolute: 0.5 10*3/uL (ref 0.0–0.5)
Eosinophils Relative: 5 %
HCT: 42.2 % (ref 36.0–46.0)
Hemoglobin: 13.5 g/dL (ref 12.0–15.0)
Immature Granulocytes: 0 %
Lymphocytes Relative: 61 %
Lymphs Abs: 6.1 10*3/uL — ABNORMAL HIGH (ref 0.7–4.0)
MCH: 30 pg (ref 26.0–34.0)
MCHC: 32 g/dL (ref 30.0–36.0)
MCV: 93.8 fL (ref 80.0–100.0)
Monocytes Absolute: 0.8 10*3/uL (ref 0.1–1.0)
Monocytes Relative: 8 %
Neutro Abs: 2.5 10*3/uL (ref 1.7–7.7)
Neutrophils Relative %: 25 %
Platelets: 233 10*3/uL (ref 150–400)
RBC: 4.5 MIL/uL (ref 3.87–5.11)
RDW: 14.2 % (ref 11.5–15.5)
WBC: 10 10*3/uL (ref 4.0–10.5)
nRBC: 0 % (ref 0.0–0.2)

## 2019-11-09 MED ORDER — ROMIPLOSTIM 250 MCG ~~LOC~~ SOLR
1.0000 ug/kg | SUBCUTANEOUS | Status: DC
  Administered 2019-11-09: 45 ug via SUBCUTANEOUS
  Filled 2019-11-09: qty 0.09

## 2019-11-09 NOTE — Telephone Encounter (Signed)
Oral Chemotherapy Pharmacist Encounter  [name] plans on picking up her medication from [place] on 11/12/19. She knows to get started after picking up her medication.  Patient Education I spoke with patient for overview of new oral chemotherapy medication: Venclexta (venetoclax) for the treatment of CLL.  Counseled patient on administration, dosing, side effects, monitoring, drug-food interactions, safe handling, storage, and disposal. Patient will take 20 mg by mouth daily for 7 days, THEN 50 mg daily for 7 days, THEN 100 mg daily for 7 days, THEN 200 mg daily for 7 days.  Side effects include but not limited to: TLS, N/V, fatigue, decreased wbc.    Reviewed with patient importance of keeping a medication schedule and plan for any missed doses.  [name] voiced understanding and appreciation. All questions answered. Medication handout placed in the mail.  Provided patient with [facility] [place] [facility] phone number. Patient knows to call the office with questions or concerns. Oral Chemotherapy Navigation Clinic will continue to follow.  Darl Pikes, PharmD, BCPS, [facility] Hematology/Oncology Clinical Pharmacist [facility] Oral [place] [facility] [phone]  11/09/2019 4:32 PM

## 2019-11-09 NOTE — Progress Notes (Signed)
Per Dr. Irene Limbo, 67mcg/kg for nplate dose

## 2019-11-12 ENCOUNTER — Other Ambulatory Visit: Payer: Self-pay | Admitting: Hematology

## 2019-11-12 ENCOUNTER — Telehealth: Payer: Self-pay | Admitting: *Deleted

## 2019-11-12 MED ORDER — ALLOPURINOL 100 MG PO TABS: 100.0000 mg | ORAL_TABLET | Freq: Every day | ORAL | 0 refills | Status: DC

## 2019-11-12 MED FILL — ALLOPURINOL 100 MG TABS: 100 | 30 days supply | Qty: 30 | Fill #0

## 2019-11-12 MED FILL — VENCLEXTA STARTING PACK: 10 & 50 & 1 | 28 days supply | Qty: 42 | Fill #0

## 2019-11-12 NOTE — Telephone Encounter (Signed)
Contacted patient to review upcoming appointments for lab/IVF 2 x week for next 4 weeks. Also reviewed need to drink 64-68 ounces of water per day while taking Venetoclax. Patient verbalized understanding.

## 2019-11-14 ENCOUNTER — Other Ambulatory Visit: Payer: Self-pay

## 2019-11-14 ENCOUNTER — Encounter (HOSPITAL_COMMUNITY): Payer: Self-pay

## 2019-11-14 ENCOUNTER — Ambulatory Visit (HOSPITAL_COMMUNITY)
Admission: RE | Admit: 2019-11-14 | Discharge: 2019-11-14 | Disposition: A | Discharge status: Home / Self Care | Payer: PPO | Source: Ambulatory Visit | Attending: Hematology | Admitting: Hematology

## 2019-11-14 DIAGNOSIS — R410 Disorientation, unspecified: Secondary | ICD-10-CM | POA: Diagnosis not present

## 2019-11-14 DIAGNOSIS — R4182 Altered mental status, unspecified: Secondary | ICD-10-CM | POA: Diagnosis not present

## 2019-11-14 DIAGNOSIS — I619 Nontraumatic intracerebral hemorrhage, unspecified: Secondary | ICD-10-CM | POA: Diagnosis not present

## 2019-11-14 NOTE — Progress Notes (Signed)
HEMATOLOGY/ONCOLOGY CLINIC NOTE  Date of Service: 11/14/2019  Patient Care Team: [name], MD as PCP - General (Family Medicine) [name], MD as Consulting Physician (Hematology)  CHIEF COMPLAINTS/PURPOSE OF CONSULTATION:  F/u for CLL and ITP  HISTORY OF PRESENTING ILLNESS:   Selena Branch is a wonderful 79 y.o. female who has been transferred to [place] from Dr. Mathis Dad Higgs for evaluation and management of CLL. The pt reports that she is doing well overall.  The pt reports that has been slightly fatigued in the past 1-2 months, but thinks it is from the heat. The fatigue does not affect her ability to function. Denies fevers, chills, and night sweats. She reports that she saw her PCP in August, who noticed that her blood counts were high and referred her to Dr. Irene Limbo.  Lab results today (05/31/2019) of CBC w diff and CMP is as follows: all values are WNL except for WBC at 39.1k, neutro abs at 1.6k, lymphs abs at 36.8k, monocytes abs at 0.0k, eosinophils abs at 0.8k, BUN at 27, Total protein at 6.4 05/31/2019 LDH is pending  On review of systems, pt reports mild fatigue and denies belly pain, infection issues and any other symptoms.   INTERVAL HISTORY:  Selena Branch is a 79 y.o. 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 Pt is joined today by her daughter. The patient's last visit with [place] was on 11/02/2019. The pt reports that she is doing well overall.  The pt reports she has started taking 20mg  of venetoclax and has been tolerating it well. She has been losing weight. She has not been having as memory problems. Pt is taking multi-vitamins.   Of note since the patient's last visit, pt has had CT Head w/o Contrast ([id number]) completed on 11/14/2019 with results revealing Expected evolution left temporal parenchymal hemorrhage. No acute abnormality.  Lab results today (11/14/19) of CBC w/diff and CMP is as follows: all values are WNL except for WBC at 12.2K,  Platelets at 22K, Lymphs Abs 6.9K, Total Protein at 6.3.  On review of systems, pt reports weight loss, healthy apetite and denies memory loss, fever, chills, diarrhea, rashes, abdominal pain, irregular bowl movements  and any other symptoms.    MEDICAL HISTORY:  Past Medical History:  Diagnosis Date  . Anxiety   . 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 Glaucoma   . Hyperlipemia   . Hypertension   . Osteoporosis     SURGICAL HISTORY: No past surgical history on file.  SOCIAL HISTORY: Social History   Socioeconomic History  . Marital status: Married    Spouse name: Not on file  . Number of children: 1  . Years of education: HS  . Highest education level: Not on file  Occupational History  . Occupation: Retired  Tobacco Use  . Smoking status: Never Smoker  . Smokeless tobacco: Never Used  Substance and Sexual Activity  . Alcohol use: No    Alcohol/week: 0.0 standard drinks  . Drug use: No  . Sexual activity: Not on file  Other Topics Concern  . Not on file  Social History Narrative   Drinks 2 cups of coffee   Social Determinants of Health   Financial Resource Strain:   . Difficulty of Paying Living Expenses: Not on file  Food Insecurity:   . Worried About Charity fundraiser in the Last Year: Not on file  . Ran Out of Food in the Last Year: Not  on file  Transportation Needs:   . Lack of Transportation (Medical): Not on file  . Lack of Transportation (Non-Medical): Not on file  Physical Activity:   . Days of Exercise per Week: Not on file  . Minutes of Exercise per Session: Not on file  Stress:   . Feeling of Stress : Not on file  Social Connections:   . Frequency of Communication with Friends and Family: Not on file  . Frequency of Social Gatherings with Friends and Family: Not on file  . Attends Religious Services: Not on file  . Active Member of Clubs or Organizations: Not on file  . Attends Archivist Meetings: Not on file  .  Marital Status: Not on file  Intimate Partner Violence:   . Fear of Current or Ex-Partner: Not on file  . Emotionally Abused: Not on file  . Physically Abused: Not on file  . Sexually Abused: Not on file    FAMILY HISTORY: Family History  Problem Relation Age of Onset  . Stroke Mother   . Lung cancer Father   . Leukemia Neg Hx     ALLERGIES:  is allergic to codeine.  MEDICATIONS:  Current Outpatient Medications  Medication Sig Dispense Refill  . allopurinol (ZYLOPRIM) 100 MG tablet Take 1 tablet (100 mg total) by mouth daily. 30 tablet 0  . amLODipine (NORVASC) 10 MG tablet Take 1 tablet (10 mg total) by mouth at bedtime. 30 tablet 0  . atorvastatin (LIPITOR) 40 MG tablet Take 40 mg by mouth daily.     . Brinzolamide-Brimonidine (SIMBRINZA) 1-0.2 % SUSP Place 1 drop into both eyes daily at 12 noon.    . Calcium Carbonate-Vitamin D (CALCIUM-D PO) Take 1 tablet by mouth daily.    . Cholecalciferol (VITAMIN D3) 2000 units TABS Take 2,000 Units by mouth daily.     . dorzolamide-timolol (COSOPT) 22.3-6.8 MG/ML ophthalmic solution Place 1 drop into both eyes daily.     . furosemide (LASIX) 20 MG tablet Take 40 mg for 3 days then resume your 20 mg daily (Patient taking differently: Take 20 mg by mouth daily. ) 30 tablet   . latanoprost (XALATAN) 0.005 % ophthalmic solution Place 1 drop into both eyes at bedtime.     . Multiple Vitamin (MULTIVITAMIN WITH MINERALS) TABS tablet Take 1 tablet by mouth daily.    . quinapril (ACCUPRIL) 40 MG tablet Take 40 mg by mouth daily.     Marland Kitchen venetoclax 10 & 50 & 100 MG TBPK Take 20 mg by mouth daily for 7 days, THEN 50 mg daily for 7 days, THEN 100 mg daily for 7 days, THEN 200 mg daily for 7 days. 1 each 0   No current facility-administered medications for this visit.    REVIEW OF SYSTEMS:   A 10+ POINT REVIEW OF SYSTEMS WAS OBTAINED including neurology, dermatology, psychiatry, cardiac, respiratory, lymph, extremities, GI, GU, Musculoskeletal,  constitutional, breasts, reproductive, HEENT.  All pertinent positives are noted in the HPI.  All others are negative.    PHYSICAL EXAMINATION:  ECOG FS:2 - Symptomatic, <50% confined to bed  There were no vitals filed for this visit. Wt Readings from Last 3 Encounters:  11/02/19 101 lb 9.6 oz (46.1 kg)  10/15/19 103 lb 4.8 oz (46.9 kg)  10/12/19 103 lb (46.7 kg)   There is no height or weight on file to calculate BMI.    GENERAL:alert, in no acute distress and comfortable SKIN: no acute rashes, no significant lesions EYES:  conjunctiva are pink and non-injected, sclera anicteric OROPHARYNX: MMM, no exudates, no oropharyngeal erythema or ulceration NECK: supple, no JVD LYMPH:  no palpable lymphadenopathy in the cervical, axillary or inguinal regions LUNGS: clear to auscultation b/l with normal respiratory effort HEART: regular rate & rhythm ABDOMEN:  normoactive bowel sounds , non tender, not distended. Extremity: no pedal edema PSYCH: alert & oriented x 3 with fluent speech NEURO: no focal motor/sensory deficits  LABORATORY DATA:  I have reviewed the data as listed  . CBC Latest Ref Rng & Units 11/15/2019 11/09/2019 11/02/2019  WBC 4.0 - 10.5 K/uL 12.2(H) 10.0 11.8(H)  Hemoglobin 12.0 - 15.0 g/dL 14.2 13.5 13.3  Hematocrit 36.0 - 46.0 % 42.9 42.2 40.9  Platelets 150 - 400 K/uL 22(L) 233 649(H)    . CMP Latest Ref Rng & Units 11/15/2019 10/15/2019 10/12/2019  Glucose 70 - 99 mg/dL 97 126(H) 119(H)  BUN 8 - 23 mg/dL 19 20 29(H)  Creatinine 0.44 - 1.00 mg/dL 0.73 0.87 0.83  Sodium 135 - 145 mmol/L 141 142 138  Potassium 3.5 - 5.1 mmol/L 3.6 3.7 4.1  Chloride 98 - 111 mmol/L 106 104 104  CO2 22 - 32 mmol/L 25 30 26   Calcium 8.9 - 10.3 mg/dL 9.7 9.6 9.2  Total Protein 6.5 - 8.1 g/dL 6.3(L) 7.7 7.3  Total Bilirubin 0.3 - 1.2 mg/dL 0.6 0.8 0.7  Alkaline Phos 38 - 126 U/L 70 66 56  AST 15 - 41 U/L 24 29 24   ALT 0 - 44 U/L 14 35 28   02/27/2019 FISH:    RADIOGRAPHIC  STUDIES: I have personally reviewed the radiological images as listed and agreed with the findings in the report. No results found.  ASSESSMENT & PLAN:   #1 CLL Previous CLL prognostic FISH panel without detectable mutations. Has been Rai stage 0.. Now with new anemia and severe thrombocytopenia. WBC counts have increased significantly to 84k from 39k 3 months ago.  Concerning for change in tempo of her disease. -08/31/2019 CT C/A/P ([id number]) ([id number]) revealed "1. Mild multistation lymphadenopathy in the retroperitoneum and bilateral pelvis as detailed, compatible with lymphoma. Normal size spleen. 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  However cannot rule out bone marrow involvement as an etiology of thrombocytopenia as well.  #3 new mild normocytic anemia.  Hemoglobin 11.7 with an MCV of 97.4. Likely related to her CLL. Elevated LDH level in the 500s would need to rule out autoimmune hemolysis. Haptoglobin pending Coombs test positive for IgG suggesting could be an element of autoimmune hemolysis.  #4 Recent intraparenchymal bleed- rpt CT head stable  PLAN: -Discussed pt labwork today, (11/15/19) of CBC w/diff and CMP is as follows: all values are WNL except for WBC at 12.2K, Platelets at 22K, Lymphs Abs 6.9K, Total Protein at 6.3. -continue Nplate weekly -Discussed CT Head w/o Contrast ([id number])- no acute new lesions. -Advised checking blood pressure  -Recommends no Asprin  Or NSAID use. -Recommends monitoring blood pressure and F/u with PCP regarding optimizing HTN control. -Will continue treatment- increasing venetoclax gradually once a week  -Will see back in 2 weeks    FOLLOW UP: Continue weekly labs and Nplate x 6, IVF if needed MD visit in 2 weeks    All of the patient's questions were answered with apparent satisfaction.  The patient knows to call the clinic with any problems, questions  or concerns.Sullivan Lone MD [place] AAHIVMS [facility] Hematology/Oncology Physician [facility]  (Office):       [phone] (Work cell):  [phone] (Fax):           [phone]  11/14/2019 11:13 AM  I, Dawayne Cirri am acting as a Education administrator for Dr. Sullivan Lone.   .I have reviewed the above documentation for accuracy and completeness, and I agree with the above. [name] MD

## 2019-11-15 ENCOUNTER — Inpatient Hospital Stay (HOSPITAL_BASED_OUTPATIENT_CLINIC_OR_DEPARTMENT_OTHER): Payer: PPO | Admitting: Hematology

## 2019-11-15 ENCOUNTER — Ambulatory Visit: Payer: PPO

## 2019-11-15 ENCOUNTER — Inpatient Hospital Stay: Payer: PPO

## 2019-11-15 ENCOUNTER — Inpatient Hospital Stay: Payer: PPO | Attending: Hematology

## 2019-11-15 ENCOUNTER — Other Ambulatory Visit: Payer: Self-pay

## 2019-11-15 ENCOUNTER — Other Ambulatory Visit: Payer: Self-pay | Admitting: *Deleted

## 2019-11-15 VITALS — BP 172/69 | HR 73 | Temp 98.9°F | Resp 18 | Ht <= 58 in | Wt 102.2 lb

## 2019-11-15 DIAGNOSIS — H409 Unspecified glaucoma: Secondary | ICD-10-CM | POA: Diagnosis not present

## 2019-11-15 DIAGNOSIS — C9110 Chronic lymphocytic leukemia of B-cell type not having achieved remission: Secondary | ICD-10-CM

## 2019-11-15 DIAGNOSIS — D693 Immune thrombocytopenic purpura: Secondary | ICD-10-CM

## 2019-11-15 DIAGNOSIS — I10 Essential (primary) hypertension: Secondary | ICD-10-CM | POA: Insufficient documentation

## 2019-11-15 DIAGNOSIS — Z79899 Other long term (current) drug therapy: Secondary | ICD-10-CM | POA: Insufficient documentation

## 2019-11-15 DIAGNOSIS — E785 Hyperlipidemia, unspecified: Secondary | ICD-10-CM | POA: Diagnosis not present

## 2019-11-15 DIAGNOSIS — I629 Nontraumatic intracranial hemorrhage, unspecified: Secondary | ICD-10-CM

## 2019-11-15 DIAGNOSIS — D649 Anemia, unspecified: Secondary | ICD-10-CM | POA: Diagnosis not present

## 2019-11-15 LAB — SAMPLE TO BLOOD BANK

## 2019-11-15 LAB — CBC WITH DIFFERENTIAL (CANCER CENTER ONLY)
Abs Immature Granulocytes: 0.04 10*3/uL (ref 0.00–0.07)
Basophils Absolute: 0.1 10*3/uL (ref 0.0–0.1)
Basophils Relative: 1 %
Eosinophils Absolute: 0.4 10*3/uL (ref 0.0–0.5)
Eosinophils Relative: 3 %
HCT: 42.9 % (ref 36.0–46.0)
Hemoglobin: 14.2 g/dL (ref 12.0–15.0)
Immature Granulocytes: 0 %
Lymphocytes Relative: 56 %
Lymphs Abs: 6.9 10*3/uL — ABNORMAL HIGH (ref 0.7–4.0)
MCH: 30.5 pg (ref 26.0–34.0)
MCHC: 33.1 g/dL (ref 30.0–36.0)
MCV: 92.3 fL (ref 80.0–100.0)
Monocytes Absolute: 0.9 10*3/uL (ref 0.1–1.0)
Monocytes Relative: 8 %
Neutro Abs: 3.9 10*3/uL (ref 1.7–7.7)
Neutrophils Relative %: 32 %
Platelet Count: 22 10*3/uL — ABNORMAL LOW (ref 150–400)
RBC: 4.65 MIL/uL (ref 3.87–5.11)
RDW: 14.5 % (ref 11.5–15.5)
WBC Count: 12.2 10*3/uL — ABNORMAL HIGH (ref 4.0–10.5)
nRBC: 0 % (ref 0.0–0.2)

## 2019-11-15 LAB — CMP (CANCER CENTER ONLY)
ALT: 14 U/L (ref 0–44)
AST: 24 U/L (ref 15–41)
Albumin: 3.8 g/dL (ref 3.5–5.0)
Alkaline Phosphatase: 70 U/L (ref 38–126)
Anion gap: 10 (ref 5–15)
BUN: 19 mg/dL (ref 8–23)
CO2: 25 mmol/L (ref 22–32)
Calcium: 9.7 mg/dL (ref 8.9–10.3)
Chloride: 106 mmol/L (ref 98–111)
Creatinine: 0.73 mg/dL (ref 0.44–1.00)
GFR, Est AFR Am: >60 mL/min (ref >60)
GFR, Estimated: >60 mL/min (ref >60)
Glucose, Bld: 97 mg/dL (ref 70–99)
Potassium: 3.6 mmol/L (ref 3.5–5.1)
Sodium: 141 mmol/L (ref 135–145)
Total Bilirubin: 0.6 mg/dL (ref 0.3–1.2)
Total Protein: 6.3 g/dL — ABNORMAL LOW (ref 6.5–8.1)

## 2019-11-15 MED ORDER — ROMIPLOSTIM 250 MCG ~~LOC~~ SOLR
3.0000 ug/kg | Freq: Once | SUBCUTANEOUS | Status: AC
  Administered 2019-11-15: 140 ug via SUBCUTANEOUS
  Filled 2019-11-15: qty 0.28

## 2019-11-15 NOTE — Progress Notes (Signed)
Per Dr. Irene Limbo, no IV fluids this week. Increase Nplate dose from 1 mcg/kg to 3 mcg/kg today due to platelet count drop.   Selena Branch, PharmD, [place] [facility] Pharmacist Pharmacy Phone: [phone] 11/15/2019

## 2019-11-15 NOTE — Patient Instructions (Signed)
Romiplostim injection What is this medicine? ROMIPLOSTIM (roe mi PLOE stim) helps your body make more platelets. This medicine is used to treat low platelets caused by chronic idiopathic thrombocytopenic purpura (ITP). This medicine may be used for other purposes; ask your health care provider or pharmacist if you have questions. COMMON BRAND NAME(S): Nplate What should I tell my health care provider before I take this medicine? They need to know if you have any of these conditions:  bleeding disorders  bone marrow problem, like blood cancer or myelodysplastic syndrome  history of blood clots  liver disease  surgery to remove your spleen  an unusual or allergic reaction to romiplostim, mannitol, other medicines, foods, dyes, or preservatives  pregnant or trying to get pregnant  breast-feeding How should I use this medicine? This medicine is for injection under the skin. It is given by a health care professional in a hospital or clinic setting. A special MedGuide will be given to you before your injection. Read this information carefully each time. Talk to your pediatrician regarding the use of this medicine in children. While this drug may be prescribed for children as young as 1 year for selected conditions, precautions do apply. Overdosage: If you think you have taken too much of this medicine contact a poison control center or emergency room at once. NOTE: This medicine is only for you. Do not share this medicine with others. What if I miss a dose? It is important not to miss your dose. Call your doctor or health care professional if you are unable to keep an appointment. What may interact with this medicine? Interactions are not expected. This list may not describe all possible interactions. Give your health care provider a list of all the medicines, herbs, non-prescription drugs, or dietary supplements you use. Also tell them if you smoke, drink alcohol, or use illegal drugs.  Some items may interact with your medicine. What should I watch for while using this medicine? Your condition will be monitored carefully while you are receiving this medicine. Visit your prescriber or health care professional for regular checks on your progress and for the needed blood tests. It is important to keep all appointments. What side effects may I notice from receiving this medicine? Side effects that you should report to your doctor or health care professional as soon as possible:  allergic reactions like skin rash, itching or hives, swelling of the face, lips, or tongue  signs and symptoms of bleeding such as bloody or black, tarry stools; red or dark brown urine; spitting up blood or brown material that looks like coffee grounds; red spots on the skin; unusual bruising or bleeding from the eyes, gums, or nose  signs and symptoms of a blood clot such as chest pain; shortness of breath; pain, swelling, or warmth in the leg  signs and symptoms of a stroke like changes in vision; confusion; trouble speaking or understanding; severe headaches; sudden numbness or weakness of the face, arm or leg; trouble walking; dizziness; loss of balance or coordination Side effects that usually do not require medical attention (report to your doctor or health care professional if they continue or are bothersome):  headache  pain in arms and legs  pain in mouth  stomach pain This list may not describe all possible side effects. Call your doctor for medical advice about side effects. You may report side effects to FDA at 1-800-FDA-1088. Where should I keep my medicine? This drug is given in a hospital or clinic   and will not be stored at home. NOTE: This sheet is a summary. It may not cover all possible information. If you have questions about this medicine, talk to your doctor, pharmacist, or health care provider.  2020 Elsevier/Gold Standard (2017-08-29 11:10:55)  

## 2019-11-16 ENCOUNTER — Ambulatory Visit: Payer: PPO

## 2019-11-16 ENCOUNTER — Other Ambulatory Visit: Payer: PPO

## 2019-11-16 ENCOUNTER — Telehealth: Payer: Self-pay | Admitting: Hematology

## 2019-11-16 NOTE — Telephone Encounter (Signed)
Cancelled and rescheduled appts per daughter's 3/4 in office request and MD approval through secure chat.

## 2019-11-19 ENCOUNTER — Other Ambulatory Visit: Payer: PPO

## 2019-11-19 ENCOUNTER — Ambulatory Visit: Payer: PPO

## 2019-11-22 ENCOUNTER — Ambulatory Visit: Payer: PPO

## 2019-11-22 ENCOUNTER — Other Ambulatory Visit: Payer: PPO

## 2019-11-23 ENCOUNTER — Inpatient Hospital Stay: Payer: PPO

## 2019-11-23 ENCOUNTER — Other Ambulatory Visit: Payer: Self-pay

## 2019-11-23 ENCOUNTER — Other Ambulatory Visit: Payer: PPO

## 2019-11-23 ENCOUNTER — Ambulatory Visit: Payer: PPO

## 2019-11-23 VITALS — BP 162/63 | HR 75 | Temp 98.2°F | Resp 20

## 2019-11-23 DIAGNOSIS — I629 Nontraumatic intracranial hemorrhage, unspecified: Secondary | ICD-10-CM

## 2019-11-23 DIAGNOSIS — C9110 Chronic lymphocytic leukemia of B-cell type not having achieved remission: Secondary | ICD-10-CM

## 2019-11-23 DIAGNOSIS — D693 Immune thrombocytopenic purpura: Secondary | ICD-10-CM | POA: Diagnosis not present

## 2019-11-23 LAB — CBC WITH DIFFERENTIAL (CANCER CENTER ONLY)
Abs Immature Granulocytes: 0.01 10*3/uL (ref 0.00–0.07)
Basophils Absolute: 0 10*3/uL (ref 0.0–0.1)
Basophils Relative: 0 %
Eosinophils Absolute: 0.4 10*3/uL (ref 0.0–0.5)
Eosinophils Relative: 5 %
HCT: 44.4 % (ref 36.0–46.0)
Hemoglobin: 14.5 g/dL (ref 12.0–15.0)
Immature Granulocytes: 0 %
Lymphocytes Relative: 56 %
Lymphs Abs: 4.9 10*3/uL — ABNORMAL HIGH (ref 0.7–4.0)
MCH: 30.1 pg (ref 26.0–34.0)
MCHC: 32.7 g/dL (ref 30.0–36.0)
MCV: 92.3 fL (ref 80.0–100.0)
Monocytes Absolute: 0.8 10*3/uL (ref 0.1–1.0)
Monocytes Relative: 8 %
Neutro Abs: 2.8 10*3/uL (ref 1.7–7.7)
Neutrophils Relative %: 31 %
Platelet Count: 296 10*3/uL (ref 150–400)
RBC: 4.81 MIL/uL (ref 3.87–5.11)
RDW: 14.6 % (ref 11.5–15.5)
WBC Count: 8.9 10*3/uL (ref 4.0–10.5)
nRBC: 0 % (ref 0.0–0.2)

## 2019-11-23 MED ORDER — SODIUM CHLORIDE 0.9% IV SOLUTION
250.0000 mL | Freq: Once | INTRAVENOUS | Status: DC
  Filled 2019-11-23: qty 250

## 2019-11-23 MED ORDER — ROMIPLOSTIM 125 MCG ~~LOC~~ SOLR
2.0000 ug/kg | SUBCUTANEOUS | Status: DC
  Administered 2019-11-23: 95 ug via SUBCUTANEOUS
  Filled 2019-11-23: qty 0.19

## 2019-11-23 NOTE — Progress Notes (Signed)
Per Dr. Irene Limbo, decrease Nplate dose today to 2 mcg/kg.   Demetrius Charity, PharmD, [place] [facility] Pharmac[facility] Pharmacy Phone: [phone] 11/23/2019

## 2019-11-23 NOTE — Patient Instructions (Signed)
Romiplostim injection What is this medicine? ROMIPLOSTIM (roe mi PLOE stim) helps your body make more platelets. This medicine is used to treat low platelets caused by chronic idiopathic thrombocytopenic purpura (ITP). This medicine may be used for other purposes; ask your health care provider or pharmacist if you have questions. COMMON BRAND NAME(S): Nplate What should I tell my health care provider before I take this medicine? They need to know if you have any of these conditions:  bleeding disorders  bone marrow problem, like blood cancer or myelodysplastic syndrome  history of blood clots  liver disease  surgery to remove your spleen  an unusual or allergic reaction to romiplostim, mannitol, other medicines, foods, dyes, or preservatives  pregnant or trying to get pregnant  breast-feeding How should I use this medicine? This medicine is for injection under the skin. It is given by a health care professional in a hospital or clinic setting. A special MedGuide will be given to you before your injection. Read this information carefully each time. Talk to your pediatrician regarding the use of this medicine in children. While this drug may be prescribed for children as young as 1 year for selected conditions, precautions do apply. Overdosage: If you think you have taken too much of this medicine contact a poison control center or emergency room at once. NOTE: This medicine is only for you. Do not share this medicine with others. What if I miss a dose? It is important not to miss your dose. Call your doctor or health care professional if you are unable to keep an appointment. What may interact with this medicine? Interactions are not expected. This list may not describe all possible interactions. Give your health care provider a list of all the medicines, herbs, non-prescription drugs, or dietary supplements you use. Also tell them if you smoke, drink alcohol, or use illegal drugs.  Some items may interact with your medicine. What should I watch for while using this medicine? Your condition will be monitored carefully while you are receiving this medicine. Visit your prescriber or health care professional for regular checks on your progress and for the needed blood tests. It is important to keep all appointments. What side effects may I notice from receiving this medicine? Side effects that you should report to your doctor or health care professional as soon as possible:  allergic reactions like skin rash, itching or hives, swelling of the face, lips, or tongue  signs and symptoms of bleeding such as bloody or black, tarry stools; red or dark brown urine; spitting up blood or brown material that looks like coffee grounds; red spots on the skin; unusual bruising or bleeding from the eyes, gums, or nose  signs and symptoms of a blood clot such as chest pain; shortness of breath; pain, swelling, or warmth in the leg  signs and symptoms of a stroke like changes in vision; confusion; trouble speaking or understanding; severe headaches; sudden numbness or weakness of the face, arm or leg; trouble walking; dizziness; loss of balance or coordination Side effects that usually do not require medical attention (report to your doctor or health care professional if they continue or are bothersome):  headache  pain in arms and legs  pain in mouth  stomach pain This list may not describe all possible side effects. Call your doctor for medical advice about side effects. You may report side effects to FDA at 1-800-FDA-1088. Where should I keep my medicine? This drug is given in a hospital or clinic   and will not be stored at home. NOTE: This sheet is a summary. It may not cover all possible information. If you have questions about this medicine, talk to your doctor, pharmacist, or health care provider.  2020 Elsevier/Gold Standard (2017-08-29 11:10:55)  Coronavirus (COVID-19) Are  you at risk?  Are you at risk for the Coronavirus (COVID-19)?  To be considered HIGH RISK for Coronavirus (COVID-19), you have to meet the following criteria:  . Traveled to [place], [place], [place], [place] or [place]; or in the [place] to [place], [place], [place], or [place]; and have fever, cough, and shortness of breath within the last 2 weeks of travel OR . Been in close contact with a person diagnosed with COVID-19 within the last 2 weeks and have fever, cough, and shortness of breath . IF YOU DO NOT MEET THESE CRITERIA, YOU ARE CONSIDERED LOW RISK FOR COVID-19.  What to do if you are HIGH RISK for COVID-19?  Marland Kitchen If you are having a medical emergency, call 911. . Seek medical care right away. Before you go to a doctor's office, urgent care or emergency department, call ahead and tell them about your recent travel, contact with someone diagnosed with COVID-19, and your symptoms. You should receive instructions from your physician's office regarding next steps of care.  . When you arrive at healthcare provider, tell the healthcare staff immediately you have returned from visiting [place], [place], [place], [place] or [place]; or traveled in the [place] to [place], [place], [place], or [place]; in the last two weeks or you have been in close contact with a person diagnosed with COVID-19 in the last 2 weeks.   . Tell the health care staff about your symptoms: fever, cough and shortness of breath. . After you have been seen by a medical provider, you will be either: o Tested for (COVID-19) and discharged home on quarantine except to seek medical care if symptoms worsen, and asked to  - Stay home and avoid contact with others until you get your results (4-5 days)  - Avoid travel on public transportation if possible (such as bus, train, or airplane) or o Sent to the Emergency Department by EMS for evaluation, COVID-19 testing, and possible admission depending on your  condition and test results.  What to do if you are LOW RISK for COVID-19?  Reduce your risk of any infection by using the same precautions used for avoiding the common cold or flu:  Marland Kitchen Wash your hands often with soap and warm water for at least 20 seconds.  If soap and water are not readily available, use an alcohol-based hand sanitizer with at least 60% alcohol.  . If coughing or sneezing, cover your mouth and nose by coughing or sneezing into the elbow areas of your shirt or coat, into a tissue or into your sleeve (not your hands). . Avoid shaking hands with others and consider head nods or verbal greetings only. . Avoid touching your eyes, nose, or mouth with unwashed hands.  . Avoid close contact with people who are sick. . Avoid places or events with large numbers of people in one location, like concerts or sporting events. . Carefully consider travel plans you have or are making. . If you are planning any travel outside or inside the [place], visit the CDC's Travelers' Health webpage for the latest health notices. . If you have some symptoms but not all symptoms, continue to monitor at home and seek medical attention  if your symptoms worsen. . If you are having a medical emergency, call 911.   [place] / e-Visit: [email or web address]         [facility]: [facility]: [id number]                   [facility]: [phone]

## 2019-11-23 NOTE — Progress Notes (Signed)
Per Dr. Irene Limbo, no IV fluids today and will adjust Nplate dose.

## 2019-11-26 ENCOUNTER — Ambulatory Visit: Payer: PPO

## 2019-11-26 ENCOUNTER — Other Ambulatory Visit: Payer: PPO

## 2019-11-29 ENCOUNTER — Other Ambulatory Visit: Payer: PPO

## 2019-11-29 ENCOUNTER — Ambulatory Visit: Payer: PPO

## 2019-11-29 ENCOUNTER — Other Ambulatory Visit: Payer: Self-pay | Admitting: *Deleted

## 2019-11-29 DIAGNOSIS — D693 Immune thrombocytopenic purpura: Secondary | ICD-10-CM

## 2019-11-29 DIAGNOSIS — C9110 Chronic lymphocytic leukemia of B-cell type not having achieved remission: Secondary | ICD-10-CM

## 2019-11-30 ENCOUNTER — Inpatient Hospital Stay: Payer: PPO

## 2019-11-30 ENCOUNTER — Other Ambulatory Visit: Payer: Self-pay

## 2019-11-30 ENCOUNTER — Inpatient Hospital Stay (HOSPITAL_BASED_OUTPATIENT_CLINIC_OR_DEPARTMENT_OTHER): Payer: PPO | Admitting: Hematology

## 2019-11-30 ENCOUNTER — Other Ambulatory Visit: Payer: PPO

## 2019-11-30 ENCOUNTER — Ambulatory Visit: Payer: PPO

## 2019-11-30 VITALS — BP 172/63 | HR 78 | Temp 98.3°F | Resp 18 | Ht <= 58 in | Wt 102.8 lb

## 2019-11-30 DIAGNOSIS — D693 Immune thrombocytopenic purpura: Secondary | ICD-10-CM

## 2019-11-30 DIAGNOSIS — C9110 Chronic lymphocytic leukemia of B-cell type not having achieved remission: Secondary | ICD-10-CM

## 2019-11-30 DIAGNOSIS — I629 Nontraumatic intracranial hemorrhage, unspecified: Secondary | ICD-10-CM

## 2019-11-30 LAB — CMP (CANCER CENTER ONLY)
ALT: 15 U/L (ref 0–44)
AST: 24 U/L (ref 15–41)
Albumin: 4.1 g/dL (ref 3.5–5.0)
Alkaline Phosphatase: 78 U/L (ref 38–126)
Anion gap: 9 (ref 5–15)
BUN: 23 mg/dL (ref 8–23)
CO2: 24 mmol/L (ref 22–32)
Calcium: 9.5 mg/dL (ref 8.9–10.3)
Chloride: 106 mmol/L (ref 98–111)
Creatinine: 0.72 mg/dL (ref 0.44–1.00)
GFR, Est AFR Am: >60 mL/min (ref >60)
GFR, Estimated: >60 mL/min (ref >60)
Glucose, Bld: 97 mg/dL (ref 70–99)
Potassium: 4.2 mmol/L (ref 3.5–5.1)
Sodium: 139 mmol/L (ref 135–145)
Total Bilirubin: 0.6 mg/dL (ref 0.3–1.2)
Total Protein: 6.6 g/dL (ref 6.5–8.1)

## 2019-11-30 LAB — CBC WITH DIFFERENTIAL/PLATELET
Abs Immature Granulocytes: 0.02 10*3/uL (ref 0.00–0.07)
Basophils Absolute: 0 10*3/uL (ref 0.0–0.1)
Basophils Relative: 0 %
Eosinophils Absolute: 0.1 10*3/uL (ref 0.0–0.5)
Eosinophils Relative: 2 %
HCT: 43.8 % (ref 36.0–46.0)
Hemoglobin: 14.5 g/dL (ref 12.0–15.0)
Immature Granulocytes: 0 %
Lymphocytes Relative: 52 %
Lymphs Abs: 3.6 10*3/uL (ref 0.7–4.0)
MCH: 30.3 pg (ref 26.0–34.0)
MCHC: 33.1 g/dL (ref 30.0–36.0)
MCV: 91.4 fL (ref 80.0–100.0)
Monocytes Absolute: 0.7 10*3/uL (ref 0.1–1.0)
Monocytes Relative: 10 %
Neutro Abs: 2.5 10*3/uL (ref 1.7–7.7)
Neutrophils Relative %: 36 %
Platelets: 131 10*3/uL — ABNORMAL LOW (ref 150–400)
RBC: 4.79 MIL/uL (ref 3.87–5.11)
RDW: 14.2 % (ref 11.5–15.5)
WBC: 6.9 10*3/uL (ref 4.0–10.5)
nRBC: 0 % (ref 0.0–0.2)

## 2019-11-30 LAB — URIC ACID: Uric Acid, Serum: 6.4 mg/dL (ref 2.5–7.1)

## 2019-11-30 MED ORDER — ROMIPLOSTIM 250 MCG ~~LOC~~ SOLR
3.0000 ug/kg | SUBCUTANEOUS | Status: DC
  Administered 2019-11-30: 12:00:00 140 ug via SUBCUTANEOUS
  Filled 2019-11-30: qty 0.28

## 2019-11-30 MED ORDER — VENETOCLAX 100 MG PO TABS: 200.0000 mg | ORAL_TABLET | Freq: Every day | ORAL | 2 refills | Status: DC

## 2019-11-30 NOTE — Progress Notes (Signed)
Per Dr. Irene Limbo, NPlate dose will be 3 mcg/kg today. No IV fluids today.   Demetrius Charity, PharmD, BCPS, [facility] Pharmacist Pharmacy Phone: [phone] 11/30/2019

## 2019-11-30 NOTE — Patient Instructions (Signed)
Romiplostim injection What is this medicine? ROMIPLOSTIM (roe mi PLOE stim) helps your body make more platelets. This medicine is used to treat low platelets caused by chronic idiopathic thrombocytopenic purpura (ITP). This medicine may be used for other purposes; ask your health care provider or pharmacist if you have questions. COMMON BRAND NAME(S): Nplate What should I tell my health care provider before I take this medicine? They need to know if you have any of these conditions:  bleeding disorders  bone marrow problem, like blood cancer or myelodysplastic syndrome  history of blood clots  liver disease  surgery to remove your spleen  an unusual or allergic reaction to romiplostim, mannitol, other medicines, foods, dyes, or preservatives  pregnant or trying to get pregnant  breast-feeding How should I use this medicine? This medicine is for injection under the skin. It is given by a health care professional in a hospital or clinic setting. A special MedGuide will be given to you before your injection. Read this information carefully each time. Talk to your pediatrician regarding the use of this medicine in children. While this drug may be prescribed for children as young as 1 year for selected conditions, precautions do apply. Overdosage: If you think you have taken too much of this medicine contact a poison control center or emergency room at once. NOTE: This medicine is only for you. Do not share this medicine with others. What if I miss a dose? It is important not to miss your dose. Call your doctor or health care professional if you are unable to keep an appointment. What may interact with this medicine? Interactions are not expected. This list may not describe all possible interactions. Give your health care provider a list of all the medicines, herbs, non-prescription drugs, or dietary supplements you use. Also tell them if you smoke, drink alcohol, or use illegal drugs.  Some items may interact with your medicine. What should I watch for while using this medicine? Your condition will be monitored carefully while you are receiving this medicine. Visit your prescriber or health care professional for regular checks on your progress and for the needed blood tests. It is important to keep all appointments. What side effects may I notice from receiving this medicine? Side effects that you should report to your doctor or health care professional as soon as possible:  allergic reactions like skin rash, itching or hives, swelling of the face, lips, or tongue  signs and symptoms of bleeding such as bloody or black, tarry stools; red or dark brown urine; spitting up blood or brown material that looks like coffee grounds; red spots on the skin; unusual bruising or bleeding from the eyes, gums, or nose  signs and symptoms of a blood clot such as chest pain; shortness of breath; pain, swelling, or warmth in the leg  signs and symptoms of a stroke like changes in vision; confusion; trouble speaking or understanding; severe headaches; sudden numbness or weakness of the face, arm or leg; trouble walking; dizziness; loss of balance or coordination Side effects that usually do not require medical attention (report to your doctor or health care professional if they continue or are bothersome):  headache  pain in arms and legs  pain in mouth  stomach pain This list may not describe all possible side effects. Call your doctor for medical advice about side effects. You may report side effects to FDA at 1-800-FDA-1088. Where should I keep my medicine? This drug is given in a hospital or clinic   and will not be stored at home. NOTE: This sheet is a summary. It may not cover all possible information. If you have questions about this medicine, talk to your doctor, pharmacist, or health care provider.  2020 Elsevier/Gold Standard (2017-08-29 11:10:55)  

## 2019-11-30 NOTE — Progress Notes (Signed)
HEMATOLOGY/ONCOLOGY CLINIC NOTE  Date of Service: 11/30/2019  Patient Care Team: [name], MD as PCP - General (Family Medicine) [name], MD as Consulting Physician (Hematology)  CHIEF COMPLAINTS/PURPOSE OF CONSULTATION:  F/u for CLL and ITP  HISTORY OF PRESENTING ILLNESS:   Selena Branch is a wonderful 79 y.o. female who has been transferred to [place] from Dr. Mathis Dad [place] for evaluation and management of CLL. The pt reports that she is doing well overall.  The pt reports that has been slightly fatigued in the past 1-2 months, but thinks it is from the heat. The fatigue does not affect her ability to function. Denies fevers, chills, and night sweats. She reports that she saw her PCP in August, who noticed that her blood counts were high and referred her to Dr. Irene Limbo.  Lab results today (05/31/2019) of CBC w diff and CMP is as follows: all values are WNL except for WBC at 39.1k, neutro abs at 1.6k, lymphs abs at 36.8k, monocytes abs at 0.0k, eosinophils abs at 0.8k, BUN at 27, Total protein at 6.4 05/31/2019 LDH is pending  On review of systems, pt reports mild fatigue and denies belly pain, infection issues and any other symptoms.   INTERVAL HISTORY:  Selena Branch is a 79 y.o. 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 Pt is joined today by her daughter. The patient's last visit with [place] was on 11/15/2019. The pt reports that she is doing well overall.  The pt reports she is doing good. She in on 100mg  of Venetoclax currently. Every once in a while she does get a little nauseous. She will be starting her 200mg  of Venetoclax this week. At home her HTN is better and she's been following up with her PCP about it. She is scheduled for her COVID19 vaccine on Monday.   Lab results today (11/30/19) of CBC w/diff and CMP is as follows: all values are WNL except for Platelets at 131K . 11/30/19 of Uric Acid at 6.4  On review of systems, pt reports sleeping well,  healthy appetite, baseline memory and denies headaches, abdominal pain, rashes and any other symptoms.    MEDICAL HISTORY:  Past Medical History:  Diagnosis Date  . Anxiety   . 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 Glaucoma   . Hyperlipemia   . Hypertension   . Osteoporosis     SURGICAL HISTORY: No past surgical history on file.  SOCIAL HISTORY: Social History   Socioeconomic History  . Marital status: Married    Spouse name: Not on file  . Number of children: 1  . Years of education: HS  . Highest education level: Not on file  Occupational History  . Occupation: Retired  Tobacco Use  . Smoking status: Never Smoker  . Smokeless tobacco: Never Used  Substance and Sexual Activity  . Alcohol use: No    Alcohol/week: 0.0 standard drinks  . Drug use: No  . Sexual activity: Not on file  Other Topics Concern  . Not on file  Social History Narrative   Drinks 2 cups of coffee   Social Determinants of Health   Financial Resource Strain:   . Difficulty of Paying Living Expenses:   Food Insecurity:   . Worried About Charity fundraiser in the Last Year:   . Arboriculturist in the Last Year:   Transportation Needs:   . Film/video editor (Medical):   Marland Kitchen Lack of Transportation (Non-Medical):  Physical Activity:   . Days of Exercise per Week:   . Minutes of Exercise per Session:   Stress:   . Feeling of Stress :   Social Connections:   . Frequency of Communication with Friends and Family:   . Frequency of Social Gatherings with Friends and Family:   . Attends Religious Services:   . Active Member of Clubs or Organizations:   . Attends Archivist Meetings:   Marland Kitchen Marital Status:   Intimate Partner Violence:   . Fear of Current or Ex-Partner:   . Emotionally Abused:   Marland Kitchen Physically Abused:   . Sexually Abused:     FAMILY HISTORY: Family History  Problem Relation Age of Onset  . Stroke Mother   . Lung cancer Father   . Leukemia Neg Hx      ALLERGIES:  is allergic to codeine.  MEDICATIONS:  Current Outpatient Medications  Medication Sig Dispense Refill  . amLODipine (NORVASC) 10 MG tablet Take 1 tablet (10 mg total) by mouth at bedtime. 30 tablet 0  . atorvastatin (LIPITOR) 40 MG tablet Take 40 mg by mouth daily.     . Brinzolamide-Brimonidine (SIMBRINZA) 1-0.2 % SUSP Place 1 drop into both eyes daily at 12 noon.    . Calcium Carbonate-Vitamin D (CALCIUM-D PO) Take 1 tablet by mouth daily.    . Cholecalciferol (VITAMIN D3) 2000 units TABS Take 2,000 Units by mouth daily.     . dorzolamide-timolol (COSOPT) 22.3-6.8 MG/ML ophthalmic solution Place 1 drop into both eyes daily.     . furosemide (LASIX) 20 MG tablet Take 40 mg for 3 days then resume your 20 mg daily (Patient taking differently: Take 20 mg by mouth daily. ) 30 tablet   . latanoprost (XALATAN) 0.005 % ophthalmic solution Place 1 drop into both eyes at bedtime.     . Multiple Vitamin (MULTIVITAMIN WITH MINERALS) TABS tablet Take 1 tablet by mouth daily.    . quinapril (ACCUPRIL) 40 MG tablet Take 40 mg by mouth daily.     Marland Kitchen venetoclax 10 & 50 & 100 MG TBPK Take 20 mg by mouth daily for 7 days, THEN 50 mg daily for 7 days, THEN 100 mg daily for 7 days, THEN 200 mg daily for 7 days. 1 each 0   No current facility-administered medications for this visit.    REVIEW OF SYSTEMS:   A 10+ POINT REVIEW OF SYSTEMS WAS OBTAINED including neurology, dermatology, psychiatry, cardiac, respiratory, lymph, extremities, GI, GU, Musculoskeletal, constitutional, breasts, reproductive, HEENT.  All pertinent positives are noted in the HPI.  All others are negative.   PHYSICAL EXAMINATION:  ECOG FS:2 - Symptomatic, <50% confined to bed  Vitals:   11/30/19 1039  BP: (!) 172/63  Pulse: 78  Resp: 18  Temp: 98.3 F (36.8 C)  SpO2: 100%   Wt Readings from Last 3 Encounters:  11/30/19 102 lb 12.8 oz (46.6 kg)  11/15/19 102 lb 3.2 oz (46.4 kg)  11/02/19 101 lb 9.6 oz (46.1 kg)    Body mass index is 21.49 kg/m.     GENERAL:alert, in no acute distress and comfortable SKIN: no acute rashes, no significant lesions EYES: conjunctiva are pink and non-injected, sclera anicteric OROPHARYNX: MMM, no exudates, no oropharyngeal erythema or ulceration NECK: supple, no JVD LYMPH:  no palpable lymphadenopathy in the cervical, axillary or inguinal regions LUNGS: clear to auscultation b/l with normal respiratory effort HEART: regular rate & rhythm ABDOMEN:  normoactive bowel sounds , non tender, not  distended. Extremity: no pedal edema PSYCH: alert & oriented x 3 with fluent speech NEURO: no focal motor/sensory deficits  LABORATORY DATA:  I have reviewed the data as listed  . CBC Latest Ref Rng & Units 11/30/2019 11/23/2019 11/15/2019  WBC 4.0 - 10.5 K/uL 6.9 8.9 12.2(H)  Hemoglobin 12.0 - 15.0 g/dL 14.5 14.5 14.2  Hematocrit 36.0 - 46.0 % 43.8 44.4 42.9  Platelets 150 - 400 K/uL 131(L) 296 22(L)    . CMP Latest Ref Rng & Units 11/30/2019 11/15/2019 10/15/2019  Glucose 70 - 99 mg/dL 97 97 126(H)  BUN 8 - 23 mg/dL 23 19 20   Creatinine 0.44 - 1.00 mg/dL 0.72 0.73 0.87  Sodium 135 - 145 mmol/L 139 141 142  Potassium 3.5 - 5.1 mmol/L 4.2 3.6 3.7  Chloride 98 - 111 mmol/L 106 106 104  CO2 22 - 32 mmol/L 24 25 30   Calcium 8.9 - 10.3 mg/dL 9.5 9.7 9.6  Total Protein 6.5 - 8.1 g/dL 6.6 6.3(L) 7.7  Total Bilirubin 0.3 - 1.2 mg/dL 0.6 0.6 0.8  Alkaline Phos 38 - 126 U/L 78 70 66  AST 15 - 41 U/L 24 24 29   ALT 0 - 44 U/L 15 14 35   02/27/2019 FISH:    RADIOGRAPHIC STUDIES: I have personally reviewed the radiological images as listed and agreed with the findings in the report. CT Head Wo Contrast  Result Date: 11/14/2019 CLINICAL DATA:  Mental status change, recent parenchymal hemorrhage EXAM: CT HEAD WITHOUT CONTRAST TECHNIQUE: Contiguous axial images were obtained from the base of the skull through the vertex without intravenous contrast. COMPARISON:  09/18/2019 FINDINGS:  Brain: There is a small area encephalomalacia at the location of prior superior temporal parenchymal hemorrhage. No acute intracranial hemorrhage or mass effect. Ventricles are stable in size. There is no new loss of gray-white differentiation. Vascular: No hyperdense vessel or unexpected calcification. Skull: Calvarium is unremarkable. Sinuses/Orbits: No acute finding. Other: None. IMPRESSION: Expected evolution left temporal parenchymal hemorrhage. No acute abnormality. Electronically Signed   By: Macy Mis M.D.   On: 11/14/2019 21:32    ASSESSMENT & PLAN:   #1 CLL Previous CLL prognostic FISH panel without detectable mutations. Has been Rai stage 0.. Now with new anemia and severe thrombocytopenia. WBC counts have increased significantly to 84k from 39k 3 months ago.  Concerning for change in tempo of her disease. -08/31/2019 CT C/A/P ([id number]) ([id number]) revealed "1. Mild multistation lymphadenopathy in the retroperitoneum and bilateral pelvis as detailed, compatible with lymphoma. Normal size spleen. 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  However cannot rule out bone marrow involvement as an etiology of thrombocytopenia as well.  #3 new mild normocytic anemia.  Hemoglobin 11.7 with an MCV of 97.4. Likely related to her CLL. Elevated LDH level in the 500s would need to rule out autoimmune hemolysis. Haptoglobin pending Coombs test positive for IgG suggesting could be an element of autoimmune hemolysis.  #4 Recent intraparenchymal bleed- rpt CT head stable  PLAN: -Discussed pt labwork today, 11/30/19; of CBC w/diff and CMP is as follows: all values are WNL except for Platelets at 131K . 11/30/19 of Uric Acid at 6.4 -No IV fluids today -No indication of Tumor Lysis -Advised on Venetoclax increase dosage to 200mg  -currently tolerating well   -Recommends monitoring blood pressure and F/u with PCP regarding optimizing HTN control. -Will be stopping  allopurinol -Will continue treatment- will hold Venetoclax dose at 200mg  po daily (new prescription sent to [place]) -Will see back in 3 weeks    FOLLOW UP: May cancel all IVF appointments. Keep weekly labs and Nplate inj appointment and schedule for next 6 weeks MD visit in 3 weeks   The total time spent in the appt was 25 minutes and more than 50% was on counseling and direct patient cares.  All of the patient's questions were answered with apparent satisfaction. The patient knows to call the clinic with any problems, questions or concerns.    Sullivan Lone MD [place] AAHIVMS [facility] Hematology/Oncology Physician [facility]  (Office):       [phone] (Work cell):  [phone] (Fax):           [phone]  11/30/2019 11:25 AM  I, Dawayne Cirri am acting as a scribe for Dr. Sullivan Lone.   .I have reviewed the above documentation for accuracy and completeness, and I agree with the above. [name] MD

## 2019-12-03 ENCOUNTER — Other Ambulatory Visit: Payer: PPO

## 2019-12-03 ENCOUNTER — Ambulatory Visit: Payer: PPO

## 2019-12-04 ENCOUNTER — Telehealth: Payer: Self-pay

## 2019-12-04 MED FILL — VENCLEXTA 100 MG TABS: 100 | 30 days supply | Qty: 60 | Fill #0

## 2019-12-04 NOTE — Telephone Encounter (Signed)
Oral Oncology Patient Advocate Encounter  Was successful in securing patient a $8000 grant from [employer] to provide copayment coverage for Venclexta.  This will keep the out of pocket expense at $0.     [employer] ID: [id number]  I have spoken with the patient.   The billing information is as follows and has been shared with [place]: [id number] PCN: [id number] Member ID: [id number] Group ID: [id number] Dates of Eligibility: 12/04/19 through 3/22/222  Fund:  [place] Patient [place] Phone [phone] Fax [phone] 12/04/2019 11:33 AM

## 2019-12-04 NOTE — Telephone Encounter (Signed)
Oral Oncology Patient Advocate Encounter   Was successful in securing patient an $61 grant from Patient [place] [facility]) to provide copayment coverage for Venclexta.  This will keep the out of pocket expense at $0.     I have spoken with the patient.    The billing information is as follows and has been shared with [place].   Member ID: [id number] Group ID: [id number] RxBin: [id number] Dates of Eligibility: 09/05/19 through 12/02/20  Fund:  [name] [name] Phone [phone] Fax [phone] 12/04/2019 11:25 AM

## 2019-12-06 ENCOUNTER — Other Ambulatory Visit: Payer: PPO

## 2019-12-06 ENCOUNTER — Ambulatory Visit: Payer: PPO

## 2019-12-07 ENCOUNTER — Telehealth: Payer: Self-pay | Admitting: Hematology

## 2019-12-07 ENCOUNTER — Inpatient Hospital Stay: Payer: PPO

## 2019-12-07 ENCOUNTER — Other Ambulatory Visit: Payer: Self-pay

## 2019-12-07 ENCOUNTER — Ambulatory Visit: Payer: PPO

## 2019-12-07 VITALS — BP 158/65 | HR 73 | Temp 98.5°F | Resp 18

## 2019-12-07 DIAGNOSIS — C9110 Chronic lymphocytic leukemia of B-cell type not having achieved remission: Secondary | ICD-10-CM

## 2019-12-07 DIAGNOSIS — D649 Anemia, unspecified: Secondary | ICD-10-CM

## 2019-12-07 DIAGNOSIS — D693 Immune thrombocytopenic purpura: Secondary | ICD-10-CM | POA: Diagnosis not present

## 2019-12-07 DIAGNOSIS — I629 Nontraumatic intracranial hemorrhage, unspecified: Secondary | ICD-10-CM

## 2019-12-07 LAB — CBC WITH DIFFERENTIAL/PLATELET
Abs Immature Granulocytes: 0.01 10*3/uL (ref 0.00–0.07)
Basophils Absolute: 0 10*3/uL (ref 0.0–0.1)
Basophils Relative: 0 %
Eosinophils Absolute: 0 10*3/uL (ref 0.0–0.5)
Eosinophils Relative: 0 %
HCT: 41.1 % (ref 36.0–46.0)
Hemoglobin: 13.6 g/dL (ref 12.0–15.0)
Immature Granulocytes: 0 %
Lymphocytes Relative: 51 %
Lymphs Abs: 2.9 10*3/uL (ref 0.7–4.0)
MCH: 30.4 pg (ref 26.0–34.0)
MCHC: 33.1 g/dL (ref 30.0–36.0)
MCV: 91.9 fL (ref 80.0–100.0)
Monocytes Absolute: 0.8 10*3/uL (ref 0.1–1.0)
Monocytes Relative: 14 %
Neutro Abs: 2 10*3/uL (ref 1.7–7.7)
Neutrophils Relative %: 35 %
Platelets: 103 10*3/uL — ABNORMAL LOW (ref 150–400)
RBC: 4.47 MIL/uL (ref 3.87–5.11)
RDW: 13.6 % (ref 11.5–15.5)
WBC: 5.7 10*3/uL (ref 4.0–10.5)
nRBC: 0 % (ref 0.0–0.2)

## 2019-12-07 MED ORDER — ROMIPLOSTIM 250 MCG ~~LOC~~ SOLR
3.0000 ug/kg | SUBCUTANEOUS | Status: DC
  Administered 2019-12-07: 140 ug via SUBCUTANEOUS
  Filled 2019-12-07: qty 0.28

## 2019-12-07 NOTE — Telephone Encounter (Signed)
Scheduled per los, called and spoke with patient's daughter about upcoming appointments.

## 2019-12-07 NOTE — Patient Instructions (Signed)
Romiplostim injection What is this medicine? ROMIPLOSTIM (roe mi PLOE stim) helps your body make more platelets. This medicine is used to treat low platelets caused by chronic idiopathic thrombocytopenic purpura (ITP). This medicine may be used for other purposes; ask your health care provider or pharmacist if you have questions. COMMON BRAND NAME(S): Nplate What should I tell my health care provider before I take this medicine? They need to know if you have any of these conditions:  bleeding disorders  bone marrow problem, like blood cancer or myelodysplastic syndrome  history of blood clots  liver disease  surgery to remove your spleen  an unusual or allergic reaction to romiplostim, mannitol, other medicines, foods, dyes, or preservatives  pregnant or trying to get pregnant  breast-feeding How should I use this medicine? This medicine is for injection under the skin. It is given by a health care professional in a hospital or clinic setting. A special MedGuide will be given to you before your injection. Read this information carefully each time. Talk to your pediatrician regarding the use of this medicine in children. While this drug may be prescribed for children as young as 1 year for selected conditions, precautions do apply. Overdosage: If you think you have taken too much of this medicine contact a poison control center or emergency room at once. NOTE: This medicine is only for you. Do not share this medicine with others. What if I miss a dose? It is important not to miss your dose. Call your doctor or health care professional if you are unable to keep an appointment. What may interact with this medicine? Interactions are not expected. This list may not describe all possible interactions. Give your health care provider a list of all the medicines, herbs, non-prescription drugs, or dietary supplements you use. Also tell them if you smoke, drink alcohol, or use illegal drugs.  Some items may interact with your medicine. What should I watch for while using this medicine? Your condition will be monitored carefully while you are receiving this medicine. Visit your prescriber or health care professional for regular checks on your progress and for the needed blood tests. It is important to keep all appointments. What side effects may I notice from receiving this medicine? Side effects that you should report to your doctor or health care professional as soon as possible:  allergic reactions like skin rash, itching or hives, swelling of the face, lips, or tongue  signs and symptoms of bleeding such as bloody or black, tarry stools; red or dark brown urine; spitting up blood or brown material that looks like coffee grounds; red spots on the skin; unusual bruising or bleeding from the eyes, gums, or nose  signs and symptoms of a blood clot such as chest pain; shortness of breath; pain, swelling, or warmth in the leg  signs and symptoms of a stroke like changes in vision; confusion; trouble speaking or understanding; severe headaches; sudden numbness or weakness of the face, arm or leg; trouble walking; dizziness; loss of balance or coordination Side effects that usually do not require medical attention (report to your doctor or health care professional if they continue or are bothersome):  headache  pain in arms and legs  pain in mouth  stomach pain This list may not describe all possible side effects. Call your doctor for medical advice about side effects. You may report side effects to FDA at 1-800-FDA-1088. Where should I keep my medicine? This drug is given in a hospital or clinic   and will not be stored at home. NOTE: This sheet is a summary. It may not cover all possible information. If you have questions about this medicine, talk to your doctor, pharmacist, or health care provider.  2020 Elsevier/Gold Standard (2017-08-29 11:10:55)  

## 2019-12-10 ENCOUNTER — Other Ambulatory Visit: Payer: PPO

## 2019-12-10 ENCOUNTER — Ambulatory Visit: Payer: PPO

## 2019-12-13 ENCOUNTER — Other Ambulatory Visit: Payer: PPO

## 2019-12-13 ENCOUNTER — Ambulatory Visit: Payer: PPO

## 2019-12-14 ENCOUNTER — Inpatient Hospital Stay: Payer: PPO | Attending: Hematology

## 2019-12-14 ENCOUNTER — Other Ambulatory Visit: Payer: Self-pay

## 2019-12-14 ENCOUNTER — Inpatient Hospital Stay: Payer: PPO

## 2019-12-14 ENCOUNTER — Ambulatory Visit: Payer: PPO

## 2019-12-14 VITALS — BP 160/55 | HR 77 | Temp 98.5°F | Resp 18

## 2019-12-14 DIAGNOSIS — D649 Anemia, unspecified: Secondary | ICD-10-CM | POA: Diagnosis not present

## 2019-12-14 DIAGNOSIS — C9110 Chronic lymphocytic leukemia of B-cell type not having achieved remission: Secondary | ICD-10-CM | POA: Insufficient documentation

## 2019-12-14 DIAGNOSIS — I10 Essential (primary) hypertension: Secondary | ICD-10-CM | POA: Insufficient documentation

## 2019-12-14 DIAGNOSIS — D693 Immune thrombocytopenic purpura: Secondary | ICD-10-CM

## 2019-12-14 DIAGNOSIS — H409 Unspecified glaucoma: Secondary | ICD-10-CM | POA: Insufficient documentation

## 2019-12-14 DIAGNOSIS — Z79899 Other long term (current) drug therapy: Secondary | ICD-10-CM | POA: Insufficient documentation

## 2019-12-14 DIAGNOSIS — I629 Nontraumatic intracranial hemorrhage, unspecified: Secondary | ICD-10-CM

## 2019-12-14 DIAGNOSIS — E785 Hyperlipidemia, unspecified: Secondary | ICD-10-CM | POA: Insufficient documentation

## 2019-12-14 LAB — CBC WITH DIFFERENTIAL/PLATELET
Abs Immature Granulocytes: 0.01 10*3/uL (ref 0.00–0.07)
Basophils Absolute: 0 10*3/uL (ref 0.0–0.1)
Basophils Relative: 0 %
Eosinophils Absolute: 0 10*3/uL (ref 0.0–0.5)
Eosinophils Relative: 0 %
HCT: 42.6 % (ref 36.0–46.0)
Hemoglobin: 13.6 g/dL (ref 12.0–15.0)
Immature Granulocytes: 0 %
Lymphocytes Relative: 46 %
Lymphs Abs: 2.4 10*3/uL (ref 0.7–4.0)
MCH: 29.9 pg (ref 26.0–34.0)
MCHC: 31.9 g/dL (ref 30.0–36.0)
MCV: 93.6 fL (ref 80.0–100.0)
Monocytes Absolute: 0.8 10*3/uL (ref 0.1–1.0)
Monocytes Relative: 14 %
Neutro Abs: 2.1 10*3/uL (ref 1.7–7.7)
Neutrophils Relative %: 40 %
Platelets: 210 10*3/uL (ref 150–400)
RBC: 4.55 MIL/uL (ref 3.87–5.11)
RDW: 13.7 % (ref 11.5–15.5)
WBC: 5.4 10*3/uL (ref 4.0–10.5)
nRBC: 0 % (ref 0.0–0.2)

## 2019-12-14 MED ORDER — ROMIPLOSTIM 250 MCG ~~LOC~~ SOLR
140.0000 ug | SUBCUTANEOUS | Status: DC
  Administered 2019-12-14: 140 ug via SUBCUTANEOUS
  Filled 2019-12-14: qty 0.28

## 2019-12-14 NOTE — Patient Instructions (Signed)
Romiplostim injection What is this medicine? ROMIPLOSTIM (roe mi PLOE stim) helps your body make more platelets. This medicine is used to treat low platelets caused by chronic idiopathic thrombocytopenic purpura (ITP). This medicine may be used for other purposes; ask your health care provider or pharmacist if you have questions. COMMON BRAND NAME(S): Nplate What should I tell my health care provider before I take this medicine? They need to know if you have any of these conditions:  bleeding disorders  bone marrow problem, like blood cancer or myelodysplastic syndrome  history of blood clots  liver disease  surgery to remove your spleen  an unusual or allergic reaction to romiplostim, mannitol, other medicines, foods, dyes, or preservatives  pregnant or trying to get pregnant  breast-feeding How should I use this medicine? This medicine is for injection under the skin. It is given by a health care professional in a hospital or clinic setting. A special MedGuide will be given to you before your injection. Read this information carefully each time. Talk to your pediatrician regarding the use of this medicine in children. While this drug may be prescribed for children as young as 1 year for selected conditions, precautions do apply. Overdosage: If you think you have taken too much of this medicine contact a poison control center or emergency room at once. NOTE: This medicine is only for you. Do not share this medicine with others. What if I miss a dose? It is important not to miss your dose. Call your doctor or health care professional if you are unable to keep an appointment. What may interact with this medicine? Interactions are not expected. This list may not describe all possible interactions. Give your health care provider a list of all the medicines, herbs, non-prescription drugs, or dietary supplements you use. Also tell them if you smoke, drink alcohol, or use illegal drugs.  Some items may interact with your medicine. What should I watch for while using this medicine? Your condition will be monitored carefully while you are receiving this medicine. Visit your prescriber or health care professional for regular checks on your progress and for the needed blood tests. It is important to keep all appointments. What side effects may I notice from receiving this medicine? Side effects that you should report to your doctor or health care professional as soon as possible:  allergic reactions like skin rash, itching or hives, swelling of the face, lips, or tongue  signs and symptoms of bleeding such as bloody or black, tarry stools; red or dark brown urine; spitting up blood or brown material that looks like coffee grounds; red spots on the skin; unusual bruising or bleeding from the eyes, gums, or nose  signs and symptoms of a blood clot such as chest pain; shortness of breath; pain, swelling, or warmth in the leg  signs and symptoms of a stroke like changes in vision; confusion; trouble speaking or understanding; severe headaches; sudden numbness or weakness of the face, arm or leg; trouble walking; dizziness; loss of balance or coordination Side effects that usually do not require medical attention (report to your doctor or health care professional if they continue or are bothersome):  headache  pain in arms and legs  pain in mouth  stomach pain This list may not describe all possible side effects. Call your doctor for medical advice about side effects. You may report side effects to FDA at 1-800-FDA-1088. Where should I keep my medicine? This drug is given in a hospital or clinic   and will not be stored at home. NOTE: This sheet is a summary. It may not cover all possible information. If you have questions about this medicine, talk to your doctor, pharmacist, or health care provider.  2020 Elsevier/Gold Standard (2017-08-29 11:10:55)  

## 2019-12-20 ENCOUNTER — Other Ambulatory Visit: Payer: PPO

## 2019-12-20 ENCOUNTER — Ambulatory Visit: Payer: PPO

## 2019-12-21 ENCOUNTER — Inpatient Hospital Stay: Payer: PPO

## 2019-12-21 ENCOUNTER — Other Ambulatory Visit: Payer: Self-pay

## 2019-12-21 ENCOUNTER — Ambulatory Visit: Payer: PPO

## 2019-12-21 VITALS — BP 156/68 | HR 72 | Temp 98.2°F | Resp 18

## 2019-12-21 DIAGNOSIS — D693 Immune thrombocytopenic purpura: Secondary | ICD-10-CM | POA: Diagnosis not present

## 2019-12-21 DIAGNOSIS — C9110 Chronic lymphocytic leukemia of B-cell type not having achieved remission: Secondary | ICD-10-CM

## 2019-12-21 DIAGNOSIS — I629 Nontraumatic intracranial hemorrhage, unspecified: Secondary | ICD-10-CM

## 2019-12-21 LAB — CBC WITH DIFFERENTIAL/PLATELET
Abs Immature Granulocytes: 0.02 10*3/uL (ref 0.00–0.07)
Basophils Absolute: 0 10*3/uL (ref 0.0–0.1)
Basophils Relative: 0 %
Eosinophils Absolute: 0 10*3/uL (ref 0.0–0.5)
Eosinophils Relative: 0 %
HCT: 42.1 % (ref 36.0–46.0)
Hemoglobin: 13.7 g/dL (ref 12.0–15.0)
Immature Granulocytes: 0 %
Lymphocytes Relative: 50 %
Lymphs Abs: 2.4 10*3/uL (ref 0.7–4.0)
MCH: 30 pg (ref 26.0–34.0)
MCHC: 32.5 g/dL (ref 30.0–36.0)
MCV: 92.3 fL (ref 80.0–100.0)
Monocytes Absolute: 0.7 10*3/uL (ref 0.1–1.0)
Monocytes Relative: 14 %
Neutro Abs: 1.8 10*3/uL (ref 1.7–7.7)
Neutrophils Relative %: 36 %
Platelets: 115 10*3/uL — ABNORMAL LOW (ref 150–400)
RBC: 4.56 MIL/uL (ref 3.87–5.11)
RDW: 13.3 % (ref 11.5–15.5)
WBC: 4.9 10*3/uL (ref 4.0–10.5)
nRBC: 0 % (ref 0.0–0.2)

## 2019-12-21 MED ORDER — ROMIPLOSTIM 250 MCG ~~LOC~~ SOLR
140.0000 ug | SUBCUTANEOUS | Status: DC
  Administered 2019-12-21: 13:00:00 140 ug via SUBCUTANEOUS
  Filled 2019-12-21: qty 0.28

## 2019-12-21 NOTE — Patient Instructions (Signed)
Romiplostim injection What is this medicine? ROMIPLOSTIM (roe mi PLOE stim) helps your body make more platelets. This medicine is used to treat low platelets caused by chronic idiopathic thrombocytopenic purpura (ITP). This medicine may be used for other purposes; ask your health care provider or pharmacist if you have questions. COMMON BRAND NAME(S): Nplate What should I tell my health care provider before I take this medicine? They need to know if you have any of these conditions:  bleeding disorders  bone marrow problem, like blood cancer or myelodysplastic syndrome  history of blood clots  liver disease  surgery to remove your spleen  an unusual or allergic reaction to romiplostim, mannitol, other medicines, foods, dyes, or preservatives  pregnant or trying to get pregnant  breast-feeding How should I use this medicine? This medicine is for injection under the skin. It is given by a health care professional in a hospital or clinic setting. A special MedGuide will be given to you before your injection. Read this information carefully each time. Talk to your pediatrician regarding the use of this medicine in children. While this drug may be prescribed for children as young as 1 year for selected conditions, precautions do apply. Overdosage: If you think you have taken too much of this medicine contact a poison control center or emergency room at once. NOTE: This medicine is only for you. Do not share this medicine with others. What if I miss a dose? It is important not to miss your dose. Call your doctor or health care professional if you are unable to keep an appointment. What may interact with this medicine? Interactions are not expected. This list may not describe all possible interactions. Give your health care provider a list of all the medicines, herbs, non-prescription drugs, or dietary supplements you use. Also tell them if you smoke, drink alcohol, or use illegal drugs.  Some items may interact with your medicine. What should I watch for while using this medicine? Your condition will be monitored carefully while you are receiving this medicine. Visit your prescriber or health care professional for regular checks on your progress and for the needed blood tests. It is important to keep all appointments. What side effects may I notice from receiving this medicine? Side effects that you should report to your doctor or health care professional as soon as possible:  allergic reactions like skin rash, itching or hives, swelling of the face, lips, or tongue  signs and symptoms of bleeding such as bloody or black, tarry stools; red or dark brown urine; spitting up blood or brown material that looks like coffee grounds; red spots on the skin; unusual bruising or bleeding from the eyes, gums, or nose  signs and symptoms of a blood clot such as chest pain; shortness of breath; pain, swelling, or warmth in the leg  signs and symptoms of a stroke like changes in vision; confusion; trouble speaking or understanding; severe headaches; sudden numbness or weakness of the face, arm or leg; trouble walking; dizziness; loss of balance or coordination Side effects that usually do not require medical attention (report to your doctor or health care professional if they continue or are bothersome):  headache  pain in arms and legs  pain in mouth  stomach pain This list may not describe all possible side effects. Call your doctor for medical advice about side effects. You may report side effects to FDA at 1-800-FDA-1088. Where should I keep my medicine? This drug is given in a hospital or clinic   and will not be stored at home. NOTE: This sheet is a summary. It may not cover all possible information. If you have questions about this medicine, talk to your doctor, pharmacist, or health care provider.  2020 Elsevier/Gold Standard (2017-08-29 11:10:55)  

## 2019-12-24 ENCOUNTER — Other Ambulatory Visit: Payer: PPO

## 2019-12-24 ENCOUNTER — Ambulatory Visit: Payer: PPO

## 2019-12-24 DIAGNOSIS — I10 Essential (primary) hypertension: Secondary | ICD-10-CM | POA: Diagnosis not present

## 2019-12-24 DIAGNOSIS — H409 Unspecified glaucoma: Secondary | ICD-10-CM | POA: Diagnosis not present

## 2019-12-24 DIAGNOSIS — E782 Mixed hyperlipidemia: Secondary | ICD-10-CM | POA: Diagnosis not present

## 2019-12-24 DIAGNOSIS — I618 Other nontraumatic intracerebral hemorrhage: Secondary | ICD-10-CM | POA: Diagnosis not present

## 2019-12-24 DIAGNOSIS — M810 Age-related osteoporosis without current pathological fracture: Secondary | ICD-10-CM | POA: Diagnosis not present

## 2019-12-27 ENCOUNTER — Ambulatory Visit: Payer: PPO

## 2019-12-27 ENCOUNTER — Other Ambulatory Visit: Payer: PPO

## 2019-12-27 NOTE — Progress Notes (Signed)
HEMATOLOGY/ONCOLOGY CLINIC NOTE  Date of Service: 12/28/2019  Patient Care Team: [name], MD as PCP - General (Family Medicine) [name], MD as Consulting Physician (Hematology)  CHIEF COMPLAINTS/PURPOSE OF CONSULTATION:  F/u for CLL and ITP  HISTORY OF PRESENTING ILLNESS:   Selena Branch is a wonderful 79 y.o. female who has been transferred to [place] from Dr. Mathis Dad [place] for evaluation and management of CLL. The pt reports that she is doing well overall.  The pt reports that has been slightly fatigued in the past 1-2 months, but thinks it is from the heat. The fatigue does not affect her ability to function. Denies fevers, chills, and night sweats. She reports that she saw her PCP in August, who noticed that her blood counts were high and referred her to Dr. Irene Limbo.  Lab results today (05/31/2019) of CBC w diff and CMP is as follows: all values are WNL except for WBC at 39.1k, neutro abs at 1.6k, lymphs abs at 36.8k, monocytes abs at 0.0k, eosinophils abs at 0.8k, BUN at 27, Total protein at 6.4 05/31/2019 LDH is pending  On review of systems, pt reports mild fatigue and denies belly pain, infection issues and any other symptoms.   INTERVAL HISTORY:  Selena Branch is a 79 y.o. 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 Pt is joined today by her daughter.  The patient's last visit with [place] was on 11/30/19. The pt reports that she is doing well overall.  The pt reports she is good. She is taking 200mg  of venetoclax daily and tolerating it well. Pt has been doing her regular amounts of activity. Last Saturday she did about 4 miles of walking while shopping. She is sleeping well. Her blood pressure has been good and she does not have any bleeding or bruising issues. Pt has gotten the 1st dose of COVID19 vaccine and is scheduled for 2nd dose.   Lab results today (12/28/19) of CBC w/diff and CMP is as follows: all values are WNL.  On review of systems, pt denies  nausea, diarrhea, bleeding, bruising, fever, infection issues, abdominal pain, pedal edema and any other symptoms.    MEDICAL HISTORY:  Past Medical History:  Diagnosis Date  . Anxiety   . 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 Glaucoma   . Hyperlipemia   . Hypertension   . Osteoporosis     SURGICAL HISTORY: No past surgical history on file.  SOCIAL HISTORY: Social History   Socioeconomic History  . Marital status: Married    Spouse name: Not on file  . Number of children: 1  . Years of education: HS  . Highest education level: Not on file  Occupational History  . Occupation: Retired  Tobacco Use  . Smoking status: Never Smoker  . Smokeless tobacco: Never Used  Substance and Sexual Activity  . Alcohol use: No    Alcohol/week: 0.0 standard drinks  . Drug use: No  . Sexual activity: Not on file  Other Topics Concern  . Not on file  Social History Narrative   Drinks 2 cups of coffee   Social Determinants of Health   Financial Resource Strain:   . Difficulty of Paying Living Expenses:   Food Insecurity:   . Worried About Charity fundraiser in the Last Year:   . Arboriculturist in the Last Year:   Transportation Needs:   . Film/video editor (Medical):   Marland Kitchen Lack of Transportation (Non-Medical):  Physical Activity:   . Days of Exercise per Week:   . Minutes of Exercise per Session:   Stress:   . Feeling of Stress :   Social Connections:   . Frequency of Communication with Friends and Family:   . Frequency of Social Gatherings with Friends and Family:   . Attends Religious Services:   . Active Member of Clubs or Organizations:   . Attends Archivist Meetings:   Marland Kitchen Marital Status:   Intimate Partner Violence:   . Fear of Current or Ex-Partner:   . Emotionally Abused:   Marland Kitchen Physically Abused:   . Sexually Abused:     FAMILY HISTORY: Family History  Problem Relation Age of Onset  . Stroke Mother   . Lung cancer Father   .  Leukemia Neg Hx     ALLERGIES:  is allergic to codeine.  MEDICATIONS:  Current Outpatient Medications  Medication Sig Dispense Refill  . amLODipine (NORVASC) 10 MG tablet Take 1 tablet (10 mg total) by mouth at bedtime. 30 tablet 0  . atorvastatin (LIPITOR) 40 MG tablet Take 40 mg by mouth daily.     . Brinzolamide-Brimonidine (SIMBRINZA) 1-0.2 % SUSP Place 1 drop into both eyes daily at 12 noon.    . Calcium Carbonate-Vitamin D (CALCIUM-D PO) Take 1 tablet by mouth daily.    . Cholecalciferol (VITAMIN D3) 2000 units TABS Take 2,000 Units by mouth daily.     . dorzolamide-timolol (COSOPT) 22.3-6.8 MG/ML ophthalmic solution Place 1 drop into both eyes daily.     . furosemide (LASIX) 20 MG tablet Take 40 mg for 3 days then resume your 20 mg daily (Patient taking differently: Take 20 mg by mouth daily. ) 30 tablet   . latanoprost (XALATAN) 0.005 % ophthalmic solution Place 1 drop into both eyes at bedtime.     . Multiple Vitamin (MULTIVITAMIN WITH MINERALS) TABS tablet Take 1 tablet by mouth daily.    . quinapril (ACCUPRIL) 40 MG tablet Take 40 mg by mouth daily.     Marland Kitchen venetoclax 100 MG TABS Take 200 mg by mouth daily. 60 tablet 2   No current facility-administered medications for this visit.    REVIEW OF SYSTEMS:   A 10+ POINT REVIEW OF SYSTEMS WAS OBTAINED including neurology, dermatology, psychiatry, cardiac, respiratory, lymph, extremities, GI, GU, Musculoskeletal, constitutional, breasts, reproductive, HEENT.  All pertinent positives are noted in the HPI.  All others are negative.   PHYSICAL EXAMINATION:  ECOG FS:2 - Symptomatic, <50% confined to bed  Vitals:   12/28/19 1116  BP: (!) 167/67  Pulse: 69  Resp: 20  Temp: 98.2 F (36.8 C)  SpO2: 100%   Wt Readings from Last 3 Encounters:  12/28/19 102 lb 8 oz (46.5 kg)  11/30/19 102 lb 12.8 oz (46.6 kg)  11/15/19 102 lb 3.2 oz (46.4 kg)   Body mass index is 21.42 kg/m.    GENERAL:alert, in no acute distress and  comfortable SKIN: no acute rashes, no significant lesions EYES: conjunctiva are pink and non-injected, sclera anicteric OROPHARYNX: MMM, no exudates, no oropharyngeal erythema or ulceration NECK: supple, no JVD LYMPH:  no palpable lymphadenopathy in the cervical, axillary or inguinal regions LUNGS: clear to auscultation b/l with normal respiratory effort HEART: regular rate & rhythm ABDOMEN:  normoactive bowel sounds , non tender, not distended. Extremity: no pedal edema PSYCH: alert & oriented x 3 with fluent speech NEURO: no focal motor/sensory deficits   LABORATORY DATA:  I have reviewed the data  as listed  . CBC Latest Ref Rng & Units 12/28/2019 12/21/2019 12/14/2019  WBC 4.0 - 10.5 K/uL 5.4 4.9 5.4  Hemoglobin 12.0 - 15.0 g/dL 14.8 13.7 13.6  Hematocrit 36.0 - 46.0 % 44.9 42.1 42.6  Platelets 150 - 400 K/uL 194 115(L) 210    . CMP Latest Ref Rng & Units 11/30/2019 11/15/2019 10/15/2019  Glucose 70 - 99 mg/dL 97 97 126(H)  BUN 8 - 23 mg/dL 23 19 20   Creatinine 0.44 - 1.00 mg/dL 0.72 0.73 0.87  Sodium 135 - 145 mmol/L 139 141 142  Potassium 3.5 - 5.1 mmol/L 4.2 3.6 3.7  Chloride 98 - 111 mmol/L 106 106 104  CO2 22 - 32 mmol/L 24 25 30   Calcium 8.9 - 10.3 mg/dL 9.5 9.7 9.6  Total Protein 6.5 - 8.1 g/dL 6.6 6.3(L) 7.7  Total Bilirubin 0.3 - 1.2 mg/dL 0.6 0.6 0.8  Alkaline Phos 38 - 126 U/L 78 70 66  AST 15 - 41 U/L 24 24 29   ALT 0 - 44 U/L 15 14 35   02/27/2019 FISH:    RADIOGRAPHIC STUDIES: I have personally reviewed the radiological images as listed and agreed with the findings in the report. No results found.  ASSESSMENT & PLAN:   #1 CLL Previous CLL prognostic FISH panel without detectable mutations. Has been Rai stage 0.. Now with new anemia and severe thrombocytopenia. WBC counts have increased significantly to 84k from 39k 3 months ago.  Concerning for change in tempo of her disease. -08/31/2019 CT C/A/P ([id number]) ([id number]) revealed "1. Mild multistation  lymphadenopathy in the retroperitoneum and bilateral pelvis as detailed, compatible with lymphoma. Normal size spleen. 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  However cannot rule out bone marrow involvement as an etiology of thrombocytopenia as well.  #3 new mild normocytic anemia.  Hemoglobin 11.7 with an MCV of 97.4. --- now resolved hgb today 14.8 Likely related to her CLL. 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  #4 h/o intraparenchymal bleed- rpt CT head stable (due to uncontrolled HTN and thrombocytopenia)  PLAN: -Discussed pt labwork today, 12/28/19; of CBC w/diff and CMP is as follows: all values are WNL. -Advised staying at 200mg  of venetoclax- currently tolerating well  -Advised trying to drop N-plate dose- currently on 31mcg/kg -will reduced to 58mcg/kg of N-plate with a plan to try to wean this off with a hope that with her CLL controlled her CLL related ITPwill progressively resolve. -BP much better controlled. -no bleeding issues. No headache or new FND. -Will see back in 1 month  FOLLOW UP: Plz continue weekly labs and Nplate x 8 weeks MD visit in 4 weeks  The total time spent in the appt was 30 minutes and more than 50% was on counseling and direct patient cares.  All of the patient's questions were answered with apparent satisfaction. The patient knows to call the clinic with any problems, questions or concerns.    Sullivan Lone MD [place] AAHIVMS [facility] Hematology/Oncology Physician [facility]  (Office):       [phone] (Work cell):  [phone] (Fax):           [phone]  12/28/2019 11:47 AM  I, Dawayne Cirri am acting as a scribe for Dr. Sullivan Lone.   .I have reviewed the above documentation for accuracy and completeness,  and I agree with the above. [name] MD

## 2019-12-28 ENCOUNTER — Inpatient Hospital Stay: Payer: PPO

## 2019-12-28 ENCOUNTER — Ambulatory Visit: Payer: PPO

## 2019-12-28 ENCOUNTER — Other Ambulatory Visit: Payer: PPO

## 2019-12-28 ENCOUNTER — Inpatient Hospital Stay (HOSPITAL_BASED_OUTPATIENT_CLINIC_OR_DEPARTMENT_OTHER): Payer: PPO | Admitting: Hematology

## 2019-12-28 ENCOUNTER — Other Ambulatory Visit: Payer: Self-pay

## 2019-12-28 VITALS — BP 167/67 | HR 69 | Temp 98.2°F | Resp 20 | Ht <= 58 in | Wt 102.5 lb

## 2019-12-28 DIAGNOSIS — D693 Immune thrombocytopenic purpura: Secondary | ICD-10-CM

## 2019-12-28 DIAGNOSIS — C9110 Chronic lymphocytic leukemia of B-cell type not having achieved remission: Secondary | ICD-10-CM

## 2019-12-28 DIAGNOSIS — I629 Nontraumatic intracranial hemorrhage, unspecified: Secondary | ICD-10-CM

## 2019-12-28 LAB — CBC WITH DIFFERENTIAL/PLATELET
Abs Immature Granulocytes: 0.01 10*3/uL (ref 0.00–0.07)
Basophils Absolute: 0 10*3/uL (ref 0.0–0.1)
Basophils Relative: 0 %
Eosinophils Absolute: 0 10*3/uL (ref 0.0–0.5)
Eosinophils Relative: 0 %
HCT: 44.9 % (ref 36.0–46.0)
Hemoglobin: 14.8 g/dL (ref 12.0–15.0)
Immature Granulocytes: 0 %
Lymphocytes Relative: 50 %
Lymphs Abs: 2.6 10*3/uL (ref 0.7–4.0)
MCH: 30.3 pg (ref 26.0–34.0)
MCHC: 33 g/dL (ref 30.0–36.0)
MCV: 92 fL (ref 80.0–100.0)
Monocytes Absolute: 0.7 10*3/uL (ref 0.1–1.0)
Monocytes Relative: 12 %
Neutro Abs: 2.1 10*3/uL (ref 1.7–7.7)
Neutrophils Relative %: 38 %
Platelets: 194 10*3/uL (ref 150–400)
RBC: 4.88 MIL/uL (ref 3.87–5.11)
RDW: 13.2 % (ref 11.5–15.5)
WBC: 5.4 10*3/uL (ref 4.0–10.5)
nRBC: 0 % (ref 0.0–0.2)

## 2019-12-28 MED ORDER — ROMIPLOSTIM 125 MCG ~~LOC~~ SOLR
2.0000 ug/kg | SUBCUTANEOUS | Status: DC
  Administered 2019-12-28: 12:00:00 95 ug via SUBCUTANEOUS
  Filled 2019-12-28: qty 0.19

## 2020-01-02 ENCOUNTER — Telehealth: Payer: Self-pay | Admitting: Hematology

## 2020-01-02 NOTE — Telephone Encounter (Signed)
Scheduled per 04/16 los, patient has been called and notified of upcoming appointments. 

## 2020-01-03 ENCOUNTER — Other Ambulatory Visit: Payer: PPO

## 2020-01-03 ENCOUNTER — Ambulatory Visit: Payer: PPO

## 2020-01-03 MED FILL — VENCLEXTA 100 MG TABS: 100 | 30 days supply | Qty: 60 | Fill #1

## 2020-01-04 ENCOUNTER — Other Ambulatory Visit: Payer: Self-pay

## 2020-01-04 ENCOUNTER — Inpatient Hospital Stay: Payer: PPO

## 2020-01-04 ENCOUNTER — Ambulatory Visit: Payer: PPO

## 2020-01-04 VITALS — BP 158/62 | HR 68 | Temp 98.2°F | Resp 18

## 2020-01-04 DIAGNOSIS — D693 Immune thrombocytopenic purpura: Secondary | ICD-10-CM | POA: Diagnosis not present

## 2020-01-04 DIAGNOSIS — I629 Nontraumatic intracranial hemorrhage, unspecified: Secondary | ICD-10-CM

## 2020-01-04 DIAGNOSIS — C9110 Chronic lymphocytic leukemia of B-cell type not having achieved remission: Secondary | ICD-10-CM

## 2020-01-04 LAB — CBC WITH DIFFERENTIAL/PLATELET
Abs Immature Granulocytes: 0.02 10*3/uL (ref 0.00–0.07)
Basophils Absolute: 0 10*3/uL (ref 0.0–0.1)
Basophils Relative: 0 %
Eosinophils Absolute: 0 10*3/uL (ref 0.0–0.5)
Eosinophils Relative: 0 %
HCT: 44.6 % (ref 36.0–46.0)
Hemoglobin: 14.7 g/dL (ref 12.0–15.0)
Immature Granulocytes: 0 %
Lymphocytes Relative: 45 %
Lymphs Abs: 2 10*3/uL (ref 0.7–4.0)
MCH: 30.3 pg (ref 26.0–34.0)
MCHC: 33 g/dL (ref 30.0–36.0)
MCV: 92 fL (ref 80.0–100.0)
Monocytes Absolute: 0.8 10*3/uL (ref 0.1–1.0)
Monocytes Relative: 19 %
Neutro Abs: 1.6 10*3/uL — ABNORMAL LOW (ref 1.7–7.7)
Neutrophils Relative %: 36 %
Platelets: 137 10*3/uL — ABNORMAL LOW (ref 150–400)
RBC: 4.85 MIL/uL (ref 3.87–5.11)
RDW: 13.2 % (ref 11.5–15.5)
WBC: 4.5 10*3/uL (ref 4.0–10.5)
nRBC: 0 % (ref 0.0–0.2)

## 2020-01-04 MED ORDER — ROMIPLOSTIM 125 MCG ~~LOC~~ SOLR
2.0000 ug/kg | SUBCUTANEOUS | Status: DC
  Administered 2020-01-04: 95 ug via SUBCUTANEOUS
  Filled 2020-01-04: qty 0.19

## 2020-01-10 ENCOUNTER — Ambulatory Visit: Payer: PPO

## 2020-01-10 ENCOUNTER — Other Ambulatory Visit: Payer: PPO

## 2020-01-11 ENCOUNTER — Other Ambulatory Visit: Payer: Self-pay

## 2020-01-11 ENCOUNTER — Inpatient Hospital Stay: Payer: PPO

## 2020-01-11 ENCOUNTER — Ambulatory Visit: Payer: PPO

## 2020-01-11 VITALS — HR 71 | Temp 98.9°F | Resp 16

## 2020-01-11 DIAGNOSIS — C9110 Chronic lymphocytic leukemia of B-cell type not having achieved remission: Secondary | ICD-10-CM

## 2020-01-11 DIAGNOSIS — D693 Immune thrombocytopenic purpura: Secondary | ICD-10-CM | POA: Diagnosis not present

## 2020-01-11 DIAGNOSIS — I629 Nontraumatic intracranial hemorrhage, unspecified: Secondary | ICD-10-CM

## 2020-01-11 LAB — CBC WITH DIFFERENTIAL/PLATELET
Abs Immature Granulocytes: 0.04 10*3/uL (ref 0.00–0.07)
Basophils Absolute: 0 10*3/uL (ref 0.0–0.1)
Basophils Relative: 0 %
Eosinophils Absolute: 0 10*3/uL (ref 0.0–0.5)
Eosinophils Relative: 0 %
HCT: 43.4 % (ref 36.0–46.0)
Hemoglobin: 14.3 g/dL (ref 12.0–15.0)
Immature Granulocytes: 1 %
Lymphocytes Relative: 38 %
Lymphs Abs: 2.7 10*3/uL (ref 0.7–4.0)
MCH: 29.9 pg (ref 26.0–34.0)
MCHC: 32.9 g/dL (ref 30.0–36.0)
MCV: 90.8 fL (ref 80.0–100.0)
Monocytes Absolute: 1 10*3/uL (ref 0.1–1.0)
Monocytes Relative: 15 %
Neutro Abs: 3.3 10*3/uL (ref 1.7–7.7)
Neutrophils Relative %: 46 %
Platelets: 157 10*3/uL (ref 150–400)
RBC: 4.78 MIL/uL (ref 3.87–5.11)
RDW: 13.2 % (ref 11.5–15.5)
WBC: 7.1 10*3/uL (ref 4.0–10.5)
nRBC: 0 % (ref 0.0–0.2)

## 2020-01-11 LAB — CMP (CANCER CENTER ONLY)
ALT: 17 U/L (ref 0–44)
AST: 22 U/L (ref 15–41)
Albumin: 3.9 g/dL (ref 3.5–5.0)
Alkaline Phosphatase: 95 U/L (ref 38–126)
Anion gap: 10 (ref 5–15)
BUN: 16 mg/dL (ref 8–23)
CO2: 28 mmol/L (ref 22–32)
Calcium: 9.6 mg/dL (ref 8.9–10.3)
Chloride: 108 mmol/L (ref 98–111)
Creatinine: 0.77 mg/dL (ref 0.44–1.00)
GFR, Est AFR Am: >60 mL/min (ref >60)
GFR, Estimated: >60 mL/min (ref >60)
Glucose, Bld: 95 mg/dL (ref 70–99)
Potassium: 4.5 mmol/L (ref 3.5–5.1)
Sodium: 146 mmol/L — ABNORMAL HIGH (ref 135–145)
Total Bilirubin: 0.8 mg/dL (ref 0.3–1.2)
Total Protein: 6.4 g/dL — ABNORMAL LOW (ref 6.5–8.1)

## 2020-01-11 MED ORDER — ROMIPLOSTIM 125 MCG ~~LOC~~ SOLR
2.0000 ug/kg | SUBCUTANEOUS | Status: DC
  Administered 2020-01-11: 95 ug via SUBCUTANEOUS
  Filled 2020-01-11: qty 0.19

## 2020-01-11 NOTE — Patient Instructions (Signed)
Romiplostim injection What is this medicine? ROMIPLOSTIM (roe mi PLOE stim) helps your body make more platelets. This medicine is used to treat low platelets caused by chronic idiopathic thrombocytopenic purpura (ITP). This medicine may be used for other purposes; ask your health care provider or pharmacist if you have questions. COMMON BRAND NAME(S): Nplate What should I tell my health care provider before I take this medicine? They need to know if you have any of these conditions:  bleeding disorders  bone marrow problem, like blood cancer or myelodysplastic syndrome  history of blood clots  liver disease  surgery to remove your spleen  an unusual or allergic reaction to romiplostim, mannitol, other medicines, foods, dyes, or preservatives  pregnant or trying to get pregnant  breast-feeding How should I use this medicine? This medicine is for injection under the skin. It is given by a health care professional in a hospital or clinic setting. A special MedGuide will be given to you before your injection. Read this information carefully each time. Talk to your pediatrician regarding the use of this medicine in children. While this drug may be prescribed for children as young as 1 year for selected conditions, precautions do apply. Overdosage: If you think you have taken too much of this medicine contact a poison control center or emergency room at once. NOTE: This medicine is only for you. Do not share this medicine with others. What if I miss a dose? It is important not to miss your dose. Call your doctor or health care professional if you are unable to keep an appointment. What may interact with this medicine? Interactions are not expected. This list may not describe all possible interactions. Give your health care provider a list of all the medicines, herbs, non-prescription drugs, or dietary supplements you use. Also tell them if you smoke, drink alcohol, or use illegal drugs.  Some items may interact with your medicine. What should I watch for while using this medicine? Your condition will be monitored carefully while you are receiving this medicine. Visit your prescriber or health care professional for regular checks on your progress and for the needed blood tests. It is important to keep all appointments. What side effects may I notice from receiving this medicine? Side effects that you should report to your doctor or health care professional as soon as possible:  allergic reactions like skin rash, itching or hives, swelling of the face, lips, or tongue  signs and symptoms of bleeding such as bloody or black, tarry stools; red or dark brown urine; spitting up blood or brown material that looks like coffee grounds; red spots on the skin; unusual bruising or bleeding from the eyes, gums, or nose  signs and symptoms of a blood clot such as chest pain; shortness of breath; pain, swelling, or warmth in the leg  signs and symptoms of a stroke like changes in vision; confusion; trouble speaking or understanding; severe headaches; sudden numbness or weakness of the face, arm or leg; trouble walking; dizziness; loss of balance or coordination Side effects that usually do not require medical attention (report to your doctor or health care professional if they continue or are bothersome):  headache  pain in arms and legs  pain in mouth  stomach pain This list may not describe all possible side effects. Call your doctor for medical advice about side effects. You may report side effects to FDA at 1-800-FDA-1088. Where should I keep my medicine? This drug is given in a hospital or clinic   and will not be stored at home. NOTE: This sheet is a summary. It may not cover all possible information. If you have questions about this medicine, talk to your doctor, pharmacist, or health care provider.  2020 Elsevier/Gold Standard (2017-08-29 11:10:55)  

## 2020-01-15 DIAGNOSIS — Z8673 Personal history of transient ischemic attack (TIA), and cerebral infarction without residual deficits: Secondary | ICD-10-CM | POA: Diagnosis not present

## 2020-01-15 DIAGNOSIS — Z01818 Encounter for other preprocedural examination: Secondary | ICD-10-CM | POA: Diagnosis not present

## 2020-01-15 DIAGNOSIS — H25812 Combined forms of age-related cataract, left eye: Secondary | ICD-10-CM | POA: Diagnosis not present

## 2020-01-15 DIAGNOSIS — H401131 Primary open-angle glaucoma, bilateral, mild stage: Secondary | ICD-10-CM | POA: Diagnosis not present

## 2020-01-15 DIAGNOSIS — H25811 Combined forms of age-related cataract, right eye: Secondary | ICD-10-CM | POA: Diagnosis not present

## 2020-01-17 ENCOUNTER — Ambulatory Visit: Payer: PPO

## 2020-01-17 ENCOUNTER — Telehealth: Payer: Self-pay | Admitting: *Deleted

## 2020-01-17 ENCOUNTER — Other Ambulatory Visit: Payer: PPO

## 2020-01-17 NOTE — Telephone Encounter (Signed)
Dr.Kale signed medical clearance form and it was faxed to [facility] - fax confirmation received

## 2020-01-18 ENCOUNTER — Inpatient Hospital Stay: Payer: PPO | Attending: Hematology

## 2020-01-18 ENCOUNTER — Inpatient Hospital Stay: Payer: PPO

## 2020-01-18 ENCOUNTER — Ambulatory Visit: Payer: PPO

## 2020-01-18 ENCOUNTER — Other Ambulatory Visit: Payer: Self-pay

## 2020-01-18 VITALS — BP 152/62 | HR 72 | Temp 98.2°F | Resp 18

## 2020-01-18 DIAGNOSIS — Z79899 Other long term (current) drug therapy: Secondary | ICD-10-CM | POA: Insufficient documentation

## 2020-01-18 DIAGNOSIS — C9110 Chronic lymphocytic leukemia of B-cell type not having achieved remission: Secondary | ICD-10-CM | POA: Diagnosis not present

## 2020-01-18 DIAGNOSIS — Z801 Family history of malignant neoplasm of trachea, bronchus and lung: Secondary | ICD-10-CM | POA: Insufficient documentation

## 2020-01-18 DIAGNOSIS — D693 Immune thrombocytopenic purpura: Secondary | ICD-10-CM

## 2020-01-18 DIAGNOSIS — D649 Anemia, unspecified: Secondary | ICD-10-CM | POA: Insufficient documentation

## 2020-01-18 DIAGNOSIS — I10 Essential (primary) hypertension: Secondary | ICD-10-CM | POA: Diagnosis not present

## 2020-01-18 DIAGNOSIS — I629 Nontraumatic intracranial hemorrhage, unspecified: Secondary | ICD-10-CM

## 2020-01-18 LAB — CBC WITH DIFFERENTIAL/PLATELET
Abs Immature Granulocytes: 0.03 10*3/uL (ref 0.00–0.07)
Basophils Absolute: 0 10*3/uL (ref 0.0–0.1)
Basophils Relative: 0 %
Eosinophils Absolute: 0 10*3/uL (ref 0.0–0.5)
Eosinophils Relative: 0 %
HCT: 41.9 % (ref 36.0–46.0)
Hemoglobin: 14.2 g/dL (ref 12.0–15.0)
Immature Granulocytes: 1 %
Lymphocytes Relative: 47 %
Lymphs Abs: 2.3 10*3/uL (ref 0.7–4.0)
MCH: 30.1 pg (ref 26.0–34.0)
MCHC: 33.9 g/dL (ref 30.0–36.0)
MCV: 89 fL (ref 80.0–100.0)
Monocytes Absolute: 0.8 10*3/uL (ref 0.1–1.0)
Monocytes Relative: 17 %
Neutro Abs: 1.7 10*3/uL (ref 1.7–7.7)
Neutrophils Relative %: 35 %
Platelets: 108 10*3/uL — ABNORMAL LOW (ref 150–400)
RBC: 4.71 MIL/uL (ref 3.87–5.11)
RDW: 12.9 % (ref 11.5–15.5)
WBC: 4.9 10*3/uL (ref 4.0–10.5)
nRBC: 0 % (ref 0.0–0.2)

## 2020-01-18 MED ORDER — ROMIPLOSTIM 125 MCG ~~LOC~~ SOLR
95.0000 ug | SUBCUTANEOUS | Status: DC
  Administered 2020-01-18: 95 ug via SUBCUTANEOUS
  Filled 2020-01-18: qty 0.19

## 2020-01-18 NOTE — Patient Instructions (Signed)
Romiplostim injection What is this medicine? ROMIPLOSTIM (roe mi PLOE stim) helps your body make more platelets. This medicine is used to treat low platelets caused by chronic idiopathic thrombocytopenic purpura (ITP). This medicine may be used for other purposes; ask your health care provider or pharmacist if you have questions. COMMON BRAND NAME(S): Nplate What should I tell my health care provider before I take this medicine? They need to know if you have any of these conditions:  bleeding disorders  bone marrow problem, like blood cancer or myelodysplastic syndrome  history of blood clots  liver disease  surgery to remove your spleen  an unusual or allergic reaction to romiplostim, mannitol, other medicines, foods, dyes, or preservatives  pregnant or trying to get pregnant  breast-feeding How should I use this medicine? This medicine is for injection under the skin. It is given by a health care professional in a hospital or clinic setting. A special MedGuide will be given to you before your injection. Read this information carefully each time. Talk to your pediatrician regarding the use of this medicine in children. While this drug may be prescribed for children as young as 1 year for selected conditions, precautions do apply. Overdosage: If you think you have taken too much of this medicine contact a poison control center or emergency room at once. NOTE: This medicine is only for you. Do not share this medicine with others. What if I miss a dose? It is important not to miss your dose. Call your doctor or health care professional if you are unable to keep an appointment. What may interact with this medicine? Interactions are not expected. This list may not describe all possible interactions. Give your health care provider a list of all the medicines, herbs, non-prescription drugs, or dietary supplements you use. Also tell them if you smoke, drink alcohol, or use illegal drugs.  Some items may interact with your medicine. What should I watch for while using this medicine? Your condition will be monitored carefully while you are receiving this medicine. Visit your prescriber or health care professional for regular checks on your progress and for the needed blood tests. It is important to keep all appointments. What side effects may I notice from receiving this medicine? Side effects that you should report to your doctor or health care professional as soon as possible:  allergic reactions like skin rash, itching or hives, swelling of the face, lips, or tongue  signs and symptoms of bleeding such as bloody or black, tarry stools; red or dark brown urine; spitting up blood or brown material that looks like coffee grounds; red spots on the skin; unusual bruising or bleeding from the eyes, gums, or nose  signs and symptoms of a blood clot such as chest pain; shortness of breath; pain, swelling, or warmth in the leg  signs and symptoms of a stroke like changes in vision; confusion; trouble speaking or understanding; severe headaches; sudden numbness or weakness of the face, arm or leg; trouble walking; dizziness; loss of balance or coordination Side effects that usually do not require medical attention (report to your doctor or health care professional if they continue or are bothersome):  headache  pain in arms and legs  pain in mouth  stomach pain This list may not describe all possible side effects. Call your doctor for medical advice about side effects. You may report side effects to FDA at 1-800-FDA-1088. Where should I keep my medicine? This drug is given in a hospital or clinic   and will not be stored at home. NOTE: This sheet is a summary. It may not cover all possible information. If you have questions about this medicine, talk to your doctor, pharmacist, or health care provider.  2020 Elsevier/Gold Standard (2017-08-29 11:10:55)  

## 2020-01-24 NOTE — Progress Notes (Signed)
HEMATOLOGY/ONCOLOGY CLINIC NOTE  Date of Service: 01/25/2020  Patient Care Team: [name], MD as PCP - General (Family Medicine) [name], MD as Consulting Physician (Hematology)  CHIEF COMPLAINTS/PURPOSE OF CONSULTATION:  F/u for CLL and ITP  HISTORY OF PRESENTING ILLNESS:   Selena Branch is a wonderful 79 y.o. female who has been transferred to [place] from Dr. Mathis Dad [facility] for evaluation and management of CLL. The pt reports that she is doing well overall.  The pt reports that has been slightly fatigued in the past 1-2 months, but thinks it is from the heat. The fatigue does not affect her ability to function. Denies fevers, chills, and night sweats. She reports that she saw her PCP in August, who noticed that her blood counts were high and referred her to Dr. Irene Limbo.  Lab results today (05/31/2019) of CBC w diff and CMP is as follows: all values are WNL except for WBC at 39.1k, neutro abs at 1.6k, lymphs abs at 36.8k, monocytes abs at 0.0k, eosinophils abs at 0.8k, BUN at 27, Total protein at 6.4 05/31/2019 LDH is pending  On review of systems, pt reports mild fatigue and denies belly pain, infection issues and any other symptoms.   INTERVAL HISTORY:  Selena Branch is a 79 y.o. 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 Pt is joined today by her daughter. The patient's last visit with [place] was on 12/28/19. The pt reports that she is doing well overall.  The pt reports she is good. After her second COVID19 vaccine she was tired for about a week. She has had not problem taking venetoclax pills. Pt has been staying active with spring cleaning.   Lab results today (01/25/20) of CBC w/diff and CMP is as follows: all values are WNL except for Hemoglobin at 15.1, Platelets at 95K  On review of systems, pt reports sleeping well and denies bleeding issues, pedal edema, skin rashes, new lumps/bumps, abdominal pain and any other symptoms.   MEDICAL HISTORY:  Past Medical  History:  Diagnosis Date  . Anxiety   . 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 Glaucoma   . Hyperlipemia   . Hypertension   . Osteoporosis     SURGICAL HISTORY: No past surgical history on file.  SOCIAL HISTORY: Social History   Socioeconomic History  . Marital status: Married    Spouse name: Not on file  . Number of children: 1  . Years of education: HS  . Highest education level: Not on file  Occupational History  . Occupation: Retired  Tobacco Use  . Smoking status: Never Smoker  . Smokeless tobacco: Never Used  Substance and Sexual Activity  . Alcohol use: No    Alcohol/week: 0.0 standard drinks  . Drug use: No  . Sexual activity: Not on file  Other Topics Concern  . Not on file  Social History Narrative   Drinks 2 cups of coffee   Social Determinants of Health   Financial Resource Strain:   . Difficulty of Paying Living Expenses:   Food Insecurity:   . Worried About Charity fundraiser in the Last Year:   . Arboriculturist in the Last Year:   Transportation Needs:   . Film/video editor (Medical):   Marland Kitchen Lack of Transportation (Non-Medical):   Physical Activity:   . Days of Exercise per Week:   . Minutes of Exercise per Session:   Stress:   . Feeling of Stress :  Social Connections:   . Frequency of Communication with Friends and Family:   . Frequency of Social Gatherings with Friends and Family:   . Attends Religious Services:   . Active Member of Clubs or Organizations:   . Attends Archivist Meetings:   Marland Kitchen Marital Status:   Intimate Partner Violence:   . Fear of Current or Ex-Partner:   . Emotionally Abused:   Marland Kitchen Physically Abused:   . Sexually Abused:     FAMILY HISTORY: Family History  Problem Relation Age of Onset  . Stroke Mother   . Lung cancer Father   . Leukemia Neg Hx     ALLERGIES:  is allergic to codeine.  MEDICATIONS:  Current Outpatient Medications  Medication Sig Dispense Refill  . amLODipine  (NORVASC) 10 MG tablet Take 1 tablet (10 mg total) by mouth at bedtime. 30 tablet 0  . atorvastatin (LIPITOR) 40 MG tablet Take 40 mg by mouth daily.     . Brinzolamide-Brimonidine (SIMBRINZA) 1-0.2 % SUSP Place 1 drop into both eyes daily at 12 noon.    . Calcium Carbonate-Vitamin D (CALCIUM-D PO) Take 1 tablet by mouth daily.    . Cholecalciferol (VITAMIN D3) 2000 units TABS Take 2,000 Units by mouth daily.     . dorzolamide-timolol (COSOPT) 22.3-6.8 MG/ML ophthalmic solution Place 1 drop into both eyes daily.     . furosemide (LASIX) 20 MG tablet Take 40 mg for 3 days then resume your 20 mg daily (Patient taking differently: Take 20 mg by mouth daily. ) 30 tablet   . latanoprost (XALATAN) 0.005 % ophthalmic solution Place 1 drop into both eyes at bedtime.     . Multiple Vitamin (MULTIVITAMIN WITH MINERALS) TABS tablet Take 1 tablet by mouth daily.    . quinapril (ACCUPRIL) 40 MG tablet Take 40 mg by mouth daily.     Marland Kitchen venetoclax 100 MG TABS Take 200 mg by mouth daily. 60 tablet 2   No current facility-administered medications for this visit.    REVIEW OF SYSTEMS:   A 10+ POINT REVIEW OF SYSTEMS WAS OBTAINED including neurology, dermatology, psychiatry, cardiac, respiratory, lymph, extremities, GI, GU, Musculoskeletal, constitutional, breasts, reproductive, HEENT.  All pertinent positives are noted in the HPI.  All others are negative.   PHYSICAL EXAMINATION:  ECOG FS:2 - Symptomatic, <50% confined to bed  Vitals:   01/25/20 1137  BP: (!) 179/60  Pulse: 66  Resp: 17  Temp: 98.7 F (37.1 C)  SpO2: 100%   Wt Readings from Last 3 Encounters:  01/25/20 102 lb (46.3 kg)  12/28/19 102 lb 8 oz (46.5 kg)  11/30/19 102 lb 12.8 oz (46.6 kg)   Body mass index is 21.32 kg/m.    Exam given in chair   GENERAL:alert, in no acute distress and comfortable SKIN: no acute rashes, no significant lesions EYES: conjunctiva are pink and non-injected, sclera anicteric OROPHARYNX: MMM, no  exudates, no oropharyngeal erythema or ulceration NECK: supple, no JVD LYMPH:  no palpable lymphadenopathy in the cervical, axillary or inguinal regions LUNGS: clear to auscultation b/l with normal respiratory effort HEART: regular rate & rhythm ABDOMEN:  normoactive bowel sounds , non tender, not distended. Extremity: no pedal edema PSYCH: alert & oriented x 3 with fluent speech NEURO: no focal motor/sensory deficits  LABORATORY DATA:  I have reviewed the data as listed  . CBC Latest Ref Rng & Units 01/25/2020 01/18/2020 01/11/2020  WBC 4.0 - 10.5 K/uL 5.8 4.9 7.1  Hemoglobin 12.0 - 15.0  g/dL 15.1(H) 14.2 14.3  Hematocrit 36.0 - 46.0 % 45.3 41.9 43.4  Platelets 150 - 400 K/uL 95(L) 108(L) 157    . CMP Latest Ref Rng & Units 01/11/2020 11/30/2019 11/15/2019  Glucose 70 - 99 mg/dL 95 97 97  BUN 8 - 23 mg/dL 16 23 19   Creatinine 0.44 - 1.00 mg/dL 0.77 0.72 0.73  Sodium 135 - 145 mmol/L 146(H) 139 141  Potassium 3.5 - 5.1 mmol/L 4.5 4.2 3.6  Chloride 98 - 111 mmol/L 108 106 106  CO2 22 - 32 mmol/L 28 24 25   Calcium 8.9 - 10.3 mg/dL 9.6 9.5 9.7  Total Protein 6.5 - 8.1 g/dL 6.4(L) 6.6 6.3(L)  Total Bilirubin 0.3 - 1.2 mg/dL 0.8 0.6 0.6  Alkaline Phos 38 - 126 U/L 95 78 70  AST 15 - 41 U/L 22 24 24   ALT 0 - 44 U/L 17 15 14    02/27/2019 FISH:    RADIOGRAPHIC STUDIES: I have personally reviewed the radiological images as listed and agreed with the findings in the report. No results found.  ASSESSMENT & PLAN:   #1 CLL Previous CLL prognostic FISH panel without detectable mutations. Has been Rai stage 0.. Now with new anemia and severe thrombocytopenia. WBC counts have increased significantly to 84k from 39k 3 months ago.  Concerning for change in tempo of her disease. -08/31/2019 CT C/A/P ([id number]) ([id number]) revealed "1. Mild multistation lymphadenopathy in the retroperitoneum and bilateral pelvis as detailed, compatible with lymphoma. Normal size spleen. No thoracic  adenopathy."  #2 severe thrombocytopenia with platelet counts around 5k likely related to CLL.  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  However cannot rule out bone marrow involvement as an etiology of thrombocytopenia as well.  #3 new mild normocytic anemia.  Hemoglobin 11.7 with an MCV of 97.4. --- now resolved hgb today 14.8 Likely related to her CLL. 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  #4 h/o intraparenchymal bleed- rpt CT head stable (due to uncontrolled HTN and thrombocytopenia)  PLAN: -Discussed pt labwork today, 01/25/20; of CBC w/diff and CMP is as follows: all values are WNL except for Hemoglobin at 15.1, Platelets at 95K -Advised on N-plate shot to help platelets -currently at 16mcg/kg per week -If continues to drop then taking of N-plate shots  -Advised on deeper remission -Advised Venetoclax can drop platelets  -Advised on oral medications vs shots in addition to Venetoclax to treat platelets  -Advised on trying to get off N-plate shot over the next several weeks -Goal Platelets >50K -Continue 200 mg of Venetoclax -currently tolerating well -BP much better controlled. -no bleeding issues. No headache or new FND. -will keep at  23mcg/kg of N-plate with a plan to try to wean this off with a hope that with her CLL controlled her CLL related ITPwill progressively resolve. -Will see back in 1 month  FOLLOW UP: Plz continue weekly labs and Nplate x 8 weeks MD visit in 4 weeks  The total time spent in the appt was 20 minutes and more than 50% was on counseling and direct patient cares.  All of the patient's questions were answered with apparent satisfaction. The patient knows to call the clinic with any problems, questions or concerns.  Sullivan Lone MD [place] AAHIVMS [facility] Hematology/Oncology Physician [facility]  (Office):       [phone] (Work cell):  [phone] (Fax):  [phone]  01/25/2020 12:03 PM  I, Dawayne Cirri am acting as a scribe for Dr. Sullivan Lone.   .I have reviewed the above documentation for accuracy and completeness, and I agree with the above. [name] MD

## 2020-01-25 ENCOUNTER — Inpatient Hospital Stay: Payer: PPO

## 2020-01-25 ENCOUNTER — Other Ambulatory Visit: Payer: Self-pay

## 2020-01-25 ENCOUNTER — Inpatient Hospital Stay (HOSPITAL_BASED_OUTPATIENT_CLINIC_OR_DEPARTMENT_OTHER): Payer: PPO | Admitting: Hematology

## 2020-01-25 VITALS — BP 179/60 | HR 66 | Temp 98.7°F | Resp 17 | Ht <= 58 in | Wt 102.0 lb

## 2020-01-25 DIAGNOSIS — C9110 Chronic lymphocytic leukemia of B-cell type not having achieved remission: Secondary | ICD-10-CM

## 2020-01-25 DIAGNOSIS — D693 Immune thrombocytopenic purpura: Secondary | ICD-10-CM | POA: Diagnosis not present

## 2020-01-25 DIAGNOSIS — I629 Nontraumatic intracranial hemorrhage, unspecified: Secondary | ICD-10-CM

## 2020-01-25 LAB — CBC WITH DIFFERENTIAL/PLATELET
Abs Immature Granulocytes: 0.02 10*3/uL (ref 0.00–0.07)
Basophils Absolute: 0 10*3/uL (ref 0.0–0.1)
Basophils Relative: 0 %
Eosinophils Absolute: 0 10*3/uL (ref 0.0–0.5)
Eosinophils Relative: 0 %
HCT: 45.3 % (ref 36.0–46.0)
Hemoglobin: 15.1 g/dL — ABNORMAL HIGH (ref 12.0–15.0)
Immature Granulocytes: 0 %
Lymphocytes Relative: 51 %
Lymphs Abs: 2.9 10*3/uL (ref 0.7–4.0)
MCH: 30 pg (ref 26.0–34.0)
MCHC: 33.3 g/dL (ref 30.0–36.0)
MCV: 89.9 fL (ref 80.0–100.0)
Monocytes Absolute: 0.8 10*3/uL (ref 0.1–1.0)
Monocytes Relative: 13 %
Neutro Abs: 2.1 10*3/uL (ref 1.7–7.7)
Neutrophils Relative %: 36 %
Platelets: 95 10*3/uL — ABNORMAL LOW (ref 150–400)
RBC: 5.04 MIL/uL (ref 3.87–5.11)
RDW: 13 % (ref 11.5–15.5)
WBC: 5.8 10*3/uL (ref 4.0–10.5)
nRBC: 0 % (ref 0.0–0.2)

## 2020-01-25 LAB — TYPE AND SCREEN
ABO/RH(D): O POS
Antibody Screen: NEGATIVE

## 2020-01-25 MED ORDER — ROMIPLOSTIM 125 MCG ~~LOC~~ SOLR
95.0000 ug | SUBCUTANEOUS | Status: DC
  Administered 2020-01-25: 95 ug via SUBCUTANEOUS
  Filled 2020-01-25: qty 0.19

## 2020-01-25 NOTE — Patient Instructions (Signed)
Romiplostim injection What is this medicine? ROMIPLOSTIM (roe mi PLOE stim) helps your body make more platelets. This medicine is used to treat low platelets caused by chronic idiopathic thrombocytopenic purpura (ITP). This medicine may be used for other purposes; ask your health care provider or pharmacist if you have questions. COMMON BRAND NAME(S): Nplate What should I tell my health care provider before I take this medicine? They need to know if you have any of these conditions:  bleeding disorders  bone marrow problem, like blood cancer or myelodysplastic syndrome  history of blood clots  liver disease  surgery to remove your spleen  an unusual or allergic reaction to romiplostim, mannitol, other medicines, foods, dyes, or preservatives  pregnant or trying to get pregnant  breast-feeding How should I use this medicine? This medicine is for injection under the skin. It is given by a health care professional in a hospital or clinic setting. A special MedGuide will be given to you before your injection. Read this information carefully each time. Talk to your pediatrician regarding the use of this medicine in children. While this drug may be prescribed for children as young as 1 year for selected conditions, precautions do apply. Overdosage: If you think you have taken too much of this medicine contact a poison control center or emergency room at once. NOTE: This medicine is only for you. Do not share this medicine with others. What if I miss a dose? It is important not to miss your dose. Call your doctor or health care professional if you are unable to keep an appointment. What may interact with this medicine? Interactions are not expected. This list may not describe all possible interactions. Give your health care provider a list of all the medicines, herbs, non-prescription drugs, or dietary supplements you use. Also tell them if you smoke, drink alcohol, or use illegal drugs.  Some items may interact with your medicine. What should I watch for while using this medicine? Your condition will be monitored carefully while you are receiving this medicine. Visit your prescriber or health care professional for regular checks on your progress and for the needed blood tests. It is important to keep all appointments. What side effects may I notice from receiving this medicine? Side effects that you should report to your doctor or health care professional as soon as possible:  allergic reactions like skin rash, itching or hives, swelling of the face, lips, or tongue  signs and symptoms of bleeding such as bloody or black, tarry stools; red or dark brown urine; spitting up blood or brown material that looks like coffee grounds; red spots on the skin; unusual bruising or bleeding from the eyes, gums, or nose  signs and symptoms of a blood clot such as chest pain; shortness of breath; pain, swelling, or warmth in the leg  signs and symptoms of a stroke like changes in vision; confusion; trouble speaking or understanding; severe headaches; sudden numbness or weakness of the face, arm or leg; trouble walking; dizziness; loss of balance or coordination Side effects that usually do not require medical attention (report to your doctor or health care professional if they continue or are bothersome):  headache  pain in arms and legs  pain in mouth  stomach pain This list may not describe all possible side effects. Call your doctor for medical advice about side effects. You may report side effects to FDA at 1-800-FDA-1088. Where should I keep my medicine? This drug is given in a hospital or clinic   and will not be stored at home. NOTE: This sheet is a summary. It may not cover all possible information. If you have questions about this medicine, talk to your doctor, pharmacist, or health care provider.  2020 Elsevier/Gold Standard (2017-08-29 11:10:55)  

## 2020-01-28 ENCOUNTER — Telehealth: Payer: Self-pay | Admitting: Hematology

## 2020-01-28 NOTE — Telephone Encounter (Signed)
Scheduled per 05/14 los, patient has been called and notified. 

## 2020-01-31 DIAGNOSIS — Z961 Presence of intraocular lens: Secondary | ICD-10-CM | POA: Diagnosis not present

## 2020-01-31 DIAGNOSIS — H409 Unspecified glaucoma: Secondary | ICD-10-CM | POA: Diagnosis not present

## 2020-01-31 DIAGNOSIS — H401131 Primary open-angle glaucoma, bilateral, mild stage: Secondary | ICD-10-CM | POA: Diagnosis not present

## 2020-01-31 DIAGNOSIS — H2512 Age-related nuclear cataract, left eye: Secondary | ICD-10-CM | POA: Diagnosis not present

## 2020-01-31 DIAGNOSIS — H401123 Primary open-angle glaucoma, left eye, severe stage: Secondary | ICD-10-CM | POA: Diagnosis not present

## 2020-01-31 DIAGNOSIS — H25812 Combined forms of age-related cataract, left eye: Secondary | ICD-10-CM | POA: Diagnosis not present

## 2020-02-01 ENCOUNTER — Inpatient Hospital Stay: Payer: PPO

## 2020-02-01 ENCOUNTER — Other Ambulatory Visit: Payer: Self-pay

## 2020-02-01 VITALS — BP 118/72 | HR 78 | Temp 98.2°F | Resp 18

## 2020-02-01 DIAGNOSIS — I629 Nontraumatic intracranial hemorrhage, unspecified: Secondary | ICD-10-CM

## 2020-02-01 DIAGNOSIS — D693 Immune thrombocytopenic purpura: Secondary | ICD-10-CM

## 2020-02-01 DIAGNOSIS — C9110 Chronic lymphocytic leukemia of B-cell type not having achieved remission: Secondary | ICD-10-CM

## 2020-02-01 LAB — CBC WITH DIFFERENTIAL/PLATELET
Abs Immature Granulocytes: 0.04 10*3/uL (ref 0.00–0.07)
Basophils Absolute: 0 10*3/uL (ref 0.0–0.1)
Basophils Relative: 0 %
Eosinophils Absolute: 0 10*3/uL (ref 0.0–0.5)
Eosinophils Relative: 0 %
HCT: 44.4 % (ref 36.0–46.0)
Hemoglobin: 14.6 g/dL (ref 12.0–15.0)
Immature Granulocytes: 1 %
Lymphocytes Relative: 42 %
Lymphs Abs: 2.1 10*3/uL (ref 0.7–4.0)
MCH: 29.7 pg (ref 26.0–34.0)
MCHC: 32.9 g/dL (ref 30.0–36.0)
MCV: 90.2 fL (ref 80.0–100.0)
Monocytes Absolute: 0.7 10*3/uL (ref 0.1–1.0)
Monocytes Relative: 14 %
Neutro Abs: 2.1 10*3/uL (ref 1.7–7.7)
Neutrophils Relative %: 43 %
Platelets: 106 10*3/uL — ABNORMAL LOW (ref 150–400)
RBC: 4.92 MIL/uL (ref 3.87–5.11)
RDW: 13.1 % (ref 11.5–15.5)
WBC: 4.9 10*3/uL (ref 4.0–10.5)
nRBC: 0 % (ref 0.0–0.2)

## 2020-02-01 MED ORDER — ROMIPLOSTIM 125 MCG ~~LOC~~ SOLR
95.0000 ug | SUBCUTANEOUS | Status: DC
  Administered 2020-02-01: 95 ug via SUBCUTANEOUS
  Filled 2020-02-01: qty 0.19

## 2020-02-01 NOTE — Patient Instructions (Signed)
Romiplostim injection What is this medicine? ROMIPLOSTIM (roe mi PLOE stim) helps your body make more platelets. This medicine is used to treat low platelets caused by chronic idiopathic thrombocytopenic purpura (ITP). This medicine may be used for other purposes; ask your health care provider or pharmacist if you have questions. COMMON BRAND NAME(S): Nplate What should I tell my health care provider before I take this medicine? They need to know if you have any of these conditions:  bleeding disorders  bone marrow problem, like blood cancer or myelodysplastic syndrome  history of blood clots  liver disease  surgery to remove your spleen  an unusual or allergic reaction to romiplostim, mannitol, other medicines, foods, dyes, or preservatives  pregnant or trying to get pregnant  breast-feeding How should I use this medicine? This medicine is for injection under the skin. It is given by a health care professional in a hospital or clinic setting. A special MedGuide will be given to you before your injection. Read this information carefully each time. Talk to your pediatrician regarding the use of this medicine in children. While this drug may be prescribed for children as young as 1 year for selected conditions, precautions do apply. Overdosage: If you think you have taken too much of this medicine contact a poison control center or emergency room at once. NOTE: This medicine is only for you. Do not share this medicine with others. What if I miss a dose? It is important not to miss your dose. Call your doctor or health care professional if you are unable to keep an appointment. What may interact with this medicine? Interactions are not expected. This list may not describe all possible interactions. Give your health care provider a list of all the medicines, herbs, non-prescription drugs, or dietary supplements you use. Also tell them if you smoke, drink alcohol, or use illegal drugs.  Some items may interact with your medicine. What should I watch for while using this medicine? Your condition will be monitored carefully while you are receiving this medicine. Visit your prescriber or health care professional for regular checks on your progress and for the needed blood tests. It is important to keep all appointments. What side effects may I notice from receiving this medicine? Side effects that you should report to your doctor or health care professional as soon as possible:  allergic reactions like skin rash, itching or hives, swelling of the face, lips, or tongue  signs and symptoms of bleeding such as bloody or black, tarry stools; red or dark brown urine; spitting up blood or brown material that looks like coffee grounds; red spots on the skin; unusual bruising or bleeding from the eyes, gums, or nose  signs and symptoms of a blood clot such as chest pain; shortness of breath; pain, swelling, or warmth in the leg  signs and symptoms of a stroke like changes in vision; confusion; trouble speaking or understanding; severe headaches; sudden numbness or weakness of the face, arm or leg; trouble walking; dizziness; loss of balance or coordination Side effects that usually do not require medical attention (report to your doctor or health care professional if they continue or are bothersome):  headache  pain in arms and legs  pain in mouth  stomach pain This list may not describe all possible side effects. Call your doctor for medical advice about side effects. You may report side effects to FDA at 1-800-FDA-1088. Where should I keep my medicine? This drug is given in a hospital or clinic   and will not be stored at home. NOTE: This sheet is a summary. It may not cover all possible information. If you have questions about this medicine, talk to your doctor, pharmacist, or health care provider.  2020 Elsevier/Gold Standard (2017-08-29 11:10:55)  

## 2020-02-08 ENCOUNTER — Inpatient Hospital Stay: Payer: PPO

## 2020-02-08 ENCOUNTER — Other Ambulatory Visit: Payer: Self-pay

## 2020-02-08 VITALS — BP 122/78 | HR 72 | Temp 98.2°F | Resp 18

## 2020-02-08 DIAGNOSIS — D693 Immune thrombocytopenic purpura: Secondary | ICD-10-CM

## 2020-02-08 DIAGNOSIS — I629 Nontraumatic intracranial hemorrhage, unspecified: Secondary | ICD-10-CM

## 2020-02-08 DIAGNOSIS — C9110 Chronic lymphocytic leukemia of B-cell type not having achieved remission: Secondary | ICD-10-CM

## 2020-02-08 LAB — CBC WITH DIFFERENTIAL/PLATELET
Abs Immature Granulocytes: 0.02 10*3/uL (ref 0.00–0.07)
Basophils Absolute: 0 10*3/uL (ref 0.0–0.1)
Basophils Relative: 0 %
Eosinophils Absolute: 0 10*3/uL (ref 0.0–0.5)
Eosinophils Relative: 0 %
HCT: 43.8 % (ref 36.0–46.0)
Hemoglobin: 14.8 g/dL (ref 12.0–15.0)
Immature Granulocytes: 0 %
Lymphocytes Relative: 46 %
Lymphs Abs: 2.3 10*3/uL (ref 0.7–4.0)
MCH: 30.4 pg (ref 26.0–34.0)
MCHC: 33.8 g/dL (ref 30.0–36.0)
MCV: 89.9 fL (ref 80.0–100.0)
Monocytes Absolute: 0.7 10*3/uL (ref 0.1–1.0)
Monocytes Relative: 14 %
Neutro Abs: 2 10*3/uL (ref 1.7–7.7)
Neutrophils Relative %: 40 %
Platelets: 96 10*3/uL — ABNORMAL LOW (ref 150–400)
RBC: 4.87 MIL/uL (ref 3.87–5.11)
RDW: 12.9 % (ref 11.5–15.5)
WBC: 5 10*3/uL (ref 4.0–10.5)
nRBC: 0 % (ref 0.0–0.2)

## 2020-02-08 MED ORDER — ROMIPLOSTIM 125 MCG ~~LOC~~ SOLR
95.0000 ug | SUBCUTANEOUS | Status: DC
  Administered 2020-02-08: 95 ug via SUBCUTANEOUS
  Filled 2020-02-08: qty 0.19

## 2020-02-08 MED FILL — VENCLEXTA 100 MG TABS: 100 | 30 days supply | Qty: 60 | Fill #2

## 2020-02-08 NOTE — Patient Instructions (Signed)
Romiplostim injection What is this medicine? ROMIPLOSTIM (roe mi PLOE stim) helps your body make more platelets. This medicine is used to treat low platelets caused by chronic idiopathic thrombocytopenic purpura (ITP). This medicine may be used for other purposes; ask your health care provider or pharmacist if you have questions. COMMON BRAND NAME(S): Nplate What should I tell my health care provider before I take this medicine? They need to know if you have any of these conditions:  bleeding disorders  bone marrow problem, like blood cancer or myelodysplastic syndrome  history of blood clots  liver disease  surgery to remove your spleen  an unusual or allergic reaction to romiplostim, mannitol, other medicines, foods, dyes, or preservatives  pregnant or trying to get pregnant  breast-feeding How should I use this medicine? This medicine is for injection under the skin. It is given by a health care professional in a hospital or clinic setting. A special MedGuide will be given to you before your injection. Read this information carefully each time. Talk to your pediatrician regarding the use of this medicine in children. While this drug may be prescribed for children as young as 1 year for selected conditions, precautions do apply. Overdosage: If you think you have taken too much of this medicine contact a poison control center or emergency room at once. NOTE: This medicine is only for you. Do not share this medicine with others. What if I miss a dose? It is important not to miss your dose. Call your doctor or health care professional if you are unable to keep an appointment. What may interact with this medicine? Interactions are not expected. This list may not describe all possible interactions. Give your health care provider a list of all the medicines, herbs, non-prescription drugs, or dietary supplements you use. Also tell them if you smoke, drink alcohol, or use illegal drugs.  Some items may interact with your medicine. What should I watch for while using this medicine? Your condition will be monitored carefully while you are receiving this medicine. Visit your prescriber or health care professional for regular checks on your progress and for the needed blood tests. It is important to keep all appointments. What side effects may I notice from receiving this medicine? Side effects that you should report to your doctor or health care professional as soon as possible:  allergic reactions like skin rash, itching or hives, swelling of the face, lips, or tongue  signs and symptoms of bleeding such as bloody or black, tarry stools; red or dark brown urine; spitting up blood or brown material that looks like coffee grounds; red spots on the skin; unusual bruising or bleeding from the eyes, gums, or nose  signs and symptoms of a blood clot such as chest pain; shortness of breath; pain, swelling, or warmth in the leg  signs and symptoms of a stroke like changes in vision; confusion; trouble speaking or understanding; severe headaches; sudden numbness or weakness of the face, arm or leg; trouble walking; dizziness; loss of balance or coordination Side effects that usually do not require medical attention (report to your doctor or health care professional if they continue or are bothersome):  headache  pain in arms and legs  pain in mouth  stomach pain This list may not describe all possible side effects. Call your doctor for medical advice about side effects. You may report side effects to FDA at 1-800-FDA-1088. Where should I keep my medicine? This drug is given in a hospital or clinic   and will not be stored at home. NOTE: This sheet is a summary. It may not cover all possible information. If you have questions about this medicine, talk to your doctor, pharmacist, or health care provider.  2020 Elsevier/Gold Standard (2017-08-29 11:10:55)  

## 2020-02-13 DIAGNOSIS — E782 Mixed hyperlipidemia: Secondary | ICD-10-CM | POA: Diagnosis not present

## 2020-02-13 DIAGNOSIS — I618 Other nontraumatic intracerebral hemorrhage: Secondary | ICD-10-CM | POA: Diagnosis not present

## 2020-02-13 DIAGNOSIS — M810 Age-related osteoporosis without current pathological fracture: Secondary | ICD-10-CM | POA: Diagnosis not present

## 2020-02-13 DIAGNOSIS — I10 Essential (primary) hypertension: Secondary | ICD-10-CM | POA: Diagnosis not present

## 2020-02-13 DIAGNOSIS — H409 Unspecified glaucoma: Secondary | ICD-10-CM | POA: Diagnosis not present

## 2020-02-15 ENCOUNTER — Other Ambulatory Visit: Payer: Self-pay

## 2020-02-15 ENCOUNTER — Inpatient Hospital Stay: Payer: PPO | Attending: Hematology

## 2020-02-15 ENCOUNTER — Inpatient Hospital Stay: Payer: PPO

## 2020-02-15 VITALS — BP 128/72 | HR 70 | Temp 98.2°F | Resp 18

## 2020-02-15 DIAGNOSIS — D693 Immune thrombocytopenic purpura: Secondary | ICD-10-CM

## 2020-02-15 DIAGNOSIS — D649 Anemia, unspecified: Secondary | ICD-10-CM | POA: Insufficient documentation

## 2020-02-15 DIAGNOSIS — Z79899 Other long term (current) drug therapy: Secondary | ICD-10-CM | POA: Insufficient documentation

## 2020-02-15 DIAGNOSIS — Z801 Family history of malignant neoplasm of trachea, bronchus and lung: Secondary | ICD-10-CM | POA: Diagnosis not present

## 2020-02-15 DIAGNOSIS — C9110 Chronic lymphocytic leukemia of B-cell type not having achieved remission: Secondary | ICD-10-CM

## 2020-02-15 DIAGNOSIS — I10 Essential (primary) hypertension: Secondary | ICD-10-CM | POA: Insufficient documentation

## 2020-02-15 DIAGNOSIS — I629 Nontraumatic intracranial hemorrhage, unspecified: Secondary | ICD-10-CM

## 2020-02-15 LAB — CMP (CANCER CENTER ONLY)
ALT: 15 U/L (ref 0–44)
AST: 22 U/L (ref 15–41)
Albumin: 4 g/dL (ref 3.5–5.0)
Alkaline Phosphatase: 97 U/L (ref 38–126)
Anion gap: 10 (ref 5–15)
BUN: 16 mg/dL (ref 8–23)
CO2: 26 mmol/L (ref 22–32)
Calcium: 10 mg/dL (ref 8.9–10.3)
Chloride: 108 mmol/L (ref 98–111)
Creatinine: 0.81 mg/dL (ref 0.44–1.00)
GFR, Est AFR Am: >60 mL/min (ref >60)
GFR, Estimated: >60 mL/min (ref >60)
Glucose, Bld: 98 mg/dL (ref 70–99)
Potassium: 4.7 mmol/L (ref 3.5–5.1)
Sodium: 144 mmol/L (ref 135–145)
Total Bilirubin: 0.6 mg/dL (ref 0.3–1.2)
Total Protein: 6.4 g/dL — ABNORMAL LOW (ref 6.5–8.1)

## 2020-02-15 LAB — CBC WITH DIFFERENTIAL/PLATELET
Abs Immature Granulocytes: 0.02 10*3/uL (ref 0.00–0.07)
Basophils Absolute: 0 10*3/uL (ref 0.0–0.1)
Basophils Relative: 0 %
Eosinophils Absolute: 0 10*3/uL (ref 0.0–0.5)
Eosinophils Relative: 0 %
HCT: 44.1 % (ref 36.0–46.0)
Hemoglobin: 15 g/dL (ref 12.0–15.0)
Immature Granulocytes: 0 %
Lymphocytes Relative: 55 %
Lymphs Abs: 2.6 10*3/uL (ref 0.7–4.0)
MCH: 30.4 pg (ref 26.0–34.0)
MCHC: 34 g/dL (ref 30.0–36.0)
MCV: 89.3 fL (ref 80.0–100.0)
Monocytes Absolute: 0.7 10*3/uL (ref 0.1–1.0)
Monocytes Relative: 14 %
Neutro Abs: 1.5 10*3/uL — ABNORMAL LOW (ref 1.7–7.7)
Neutrophils Relative %: 31 %
Platelets: 87 10*3/uL — ABNORMAL LOW (ref 150–400)
RBC: 4.94 MIL/uL (ref 3.87–5.11)
RDW: 12.9 % (ref 11.5–15.5)
WBC: 4.9 10*3/uL (ref 4.0–10.5)
nRBC: 0 % (ref 0.0–0.2)

## 2020-02-15 MED ORDER — ROMIPLOSTIM 250 MCG ~~LOC~~ SOLR
143.0000 ug | SUBCUTANEOUS | Status: DC
  Administered 2020-02-15: 143 ug via SUBCUTANEOUS
  Filled 2020-02-15: qty 0.29

## 2020-02-15 NOTE — Patient Instructions (Signed)
Romiplostim injection What is this medicine? ROMIPLOSTIM (roe mi PLOE stim) helps your body make more platelets. This medicine is used to treat low platelets caused by chronic idiopathic thrombocytopenic purpura (ITP). This medicine may be used for other purposes; ask your health care provider or pharmacist if you have questions. COMMON BRAND NAME(S): Nplate What should I tell my health care provider before I take this medicine? They need to know if you have any of these conditions:  bleeding disorders  bone marrow problem, like blood cancer or myelodysplastic syndrome  history of blood clots  liver disease  surgery to remove your spleen  an unusual or allergic reaction to romiplostim, mannitol, other medicines, foods, dyes, or preservatives  pregnant or trying to get pregnant  breast-feeding How should I use this medicine? This medicine is for injection under the skin. It is given by a health care professional in a hospital or clinic setting. A special MedGuide will be given to you before your injection. Read this information carefully each time. Talk to your pediatrician regarding the use of this medicine in children. While this drug may be prescribed for children as young as 1 year for selected conditions, precautions do apply. Overdosage: If you think you have taken too much of this medicine contact a poison control center or emergency room at once. NOTE: This medicine is only for you. Do not share this medicine with others. What if I miss a dose? It is important not to miss your dose. Call your doctor or health care professional if you are unable to keep an appointment. What may interact with this medicine? Interactions are not expected. This list may not describe all possible interactions. Give your health care provider a list of all the medicines, herbs, non-prescription drugs, or dietary supplements you use. Also tell them if you smoke, drink alcohol, or use illegal drugs.  Some items may interact with your medicine. What should I watch for while using this medicine? Your condition will be monitored carefully while you are receiving this medicine. Visit your prescriber or health care professional for regular checks on your progress and for the needed blood tests. It is important to keep all appointments. What side effects may I notice from receiving this medicine? Side effects that you should report to your doctor or health care professional as soon as possible:  allergic reactions like skin rash, itching or hives, swelling of the face, lips, or tongue  signs and symptoms of bleeding such as bloody or black, tarry stools; red or dark brown urine; spitting up blood or brown material that looks like coffee grounds; red spots on the skin; unusual bruising or bleeding from the eyes, gums, or nose  signs and symptoms of a blood clot such as chest pain; shortness of breath; pain, swelling, or warmth in the leg  signs and symptoms of a stroke like changes in vision; confusion; trouble speaking or understanding; severe headaches; sudden numbness or weakness of the face, arm or leg; trouble walking; dizziness; loss of balance or coordination Side effects that usually do not require medical attention (report to your doctor or health care professional if they continue or are bothersome):  headache  pain in arms and legs  pain in mouth  stomach pain This list may not describe all possible side effects. Call your doctor for medical advice about side effects. You may report side effects to FDA at 1-800-FDA-1088. Where should I keep my medicine? This drug is given in a hospital or clinic   and will not be stored at home. NOTE: This sheet is a summary. It may not cover all possible information. If you have questions about this medicine, talk to your doctor, pharmacist, or health care provider.  2020 Elsevier/Gold Standard (2017-08-29 11:10:55)  

## 2020-02-21 NOTE — Progress Notes (Signed)
HEMATOLOGY/ONCOLOGY CLINIC NOTE  Date of Service: 02/22/2020  Patient Care Team: [name], MD as PCP - General (Family Medicine) [name], MD as Consulting Physician (Hematology)  CHIEF COMPLAINTS/PURPOSE OF CONSULTATION:  F/u for CLL and ITP  HISTORY OF PRESENTING ILLNESS:   Selena Branch is a wonderful 79 y.o. female who has been transferred to Selena Branch from Dr. Mathis Dad Higgs for evaluation and management of CLL. The pt reports that she is doing well overall.  The pt reports that has been slightly fatigued in the past 1-2 months, but thinks it is from the heat. The fatigue does not affect her ability to function. Denies fevers, chills, and night sweats. She reports that she saw her PCP in August, who noticed that her blood counts were high and referred her to Dr. Irene Limbo.  Lab results today (05/31/2019) of CBC w diff and CMP is as follows: all values are WNL except for WBC at 39.1k, neutro abs at 1.6k, lymphs abs at 36.8k, monocytes abs at 0.0k, eosinophils abs at 0.8k, BUN at 27, Total protein at 6.4 05/31/2019 LDH is pending  On review of systems, pt reports mild fatigue and denies belly pain, infection issues and any other symptoms.   INTERVAL HISTORY:  Selena Branch is a 79 y.o. 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 Pt is joined today by her daughter. The patient's last visit with Selena Branch was on 01/25/20. The pt reports that she is doing well overall.  The pt reports she is good. She has had no trouble tolerating Venetoclax.   Lab results today (02/22/20) of CBC w/diff and CMP is as follows: all values are WNL except for Neutro Abs at 1.3K, Sodium at 149, Total Protein at 6.3 02/22/20 of LDH at 191: WNL  On review of systems, pt denies fevers, chills, night sweats, red spots, abnormal bleeding or bruising and any other symptoms.   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  SOCIAL HISTORY: Social History   Socioeconomic History   Marital status: Married    Spouse name: Not on file   Number of children: 1   Years of education: HS   Highest education level: Not on file  Occupational History   Occupation: Retired  Tobacco Use   Smoking status: Never Smoker   Smokeless tobacco: Never Used  Substance and Sexual Activity   Alcohol use: No    Alcohol/week: 0.0 standard drinks   Drug use: No   Sexual activity: Not on file  Other Topics Concern   Not on file  Social History Narrative   Drinks 2 cups of coffee   Social Determinants of Health   Financial Resource Strain:    Difficulty of Paying Living Expenses:   Food Insecurity:    Worried About Charity fundraiser in the Last Year:    Arboriculturist in the Last Year:   Transportation Needs:    Film/video editor (Medical):    Lack of Transportation (Non-Medical):   Physical Activity:    Days of Exercise per Week:    Minutes of Exercise per Session:   Stress:    Feeling of Stress :   Social Connections:    Frequency of Communication with Friends and  Family:    Frequency of Social Gatherings with Friends and Family:    Attends Religious Services:    Active Member of Clubs or Organizations:    Attends Music therapist:    Marital Status:   Intimate Partner Violence:    Fear of Current or Ex-Partner:    Emotionally Abused:    Physically Abused:    Sexually Abused:     FAMILY HISTORY: Family History  Problem Relation Age of Onset   Stroke Mother    Lung cancer Father    Leukemia Neg Hx     ALLERGIES:  is allergic to codeine.  MEDICATIONS:  Current Outpatient Medications  Medication Sig Dispense Refill   amLODipine (NORVASC) 10 MG tablet Take 1 tablet (10 mg total) by mouth at bedtime. 30 tablet 0    atorvastatin (LIPITOR) 40 MG tablet Take 40 mg by mouth daily.      Brinzolamide-Brimonidine (SIMBRINZA) 1-0.2 % SUSP Place 1 drop into both eyes daily at 12 noon.     Calcium Carbonate-Vitamin D (CALCIUM-D PO) Take 1 tablet by mouth daily.     Cholecalciferol (VITAMIN D3) 2000 units TABS Take 2,000 Units by mouth daily.      dorzolamide-timolol (COSOPT) 22.3-6.8 MG/ML ophthalmic solution Place 1 drop into both eyes daily.      furosemide (LASIX) 20 MG tablet Take 40 mg for 3 days then resume your 20 mg daily (Patient taking differently: Take 20 mg by mouth daily. ) 30 tablet    latanoprost (XALATAN) 0.005 % ophthalmic solution Place 1 drop into both eyes at bedtime.      Multiple Vitamin (MULTIVITAMIN WITH MINERALS) TABS tablet Take 1 tablet by mouth daily.     quinapril (ACCUPRIL) 40 MG tablet Take 40 mg by mouth daily.      venetoclax 100 MG TABS Take 200 mg by mouth daily. 60 tablet 2   No current facility-administered medications for this visit.    REVIEW OF SYSTEMS:   A 10+ POINT REVIEW OF SYSTEMS WAS OBTAINED including neurology, dermatology, psychiatry, cardiac, respiratory, lymph, extremities, GI, GU, Musculoskeletal, constitutional, breasts, reproductive, HEENT.  All pertinent positives are noted in the HPI.  All others are negative.   PHYSICAL EXAMINATION:  ECOG FS:2 - Symptomatic, <50% confined to bed  Vitals:   02/22/20 0919  BP: (!) 168/64  Pulse: 66  Resp: 17  Temp: 98.1 F (36.7 C)  SpO2: 100%   Wt Readings from Last 3 Encounters:  02/22/20 101 lb 3.2 oz (45.9 kg)  01/25/20 102 lb (46.3 kg)  12/28/19 102 lb 8 oz (46.5 kg)   Body mass index is 21.15 kg/m.    Exam given in chair   GENERAL:alert, in no acute distress and comfortable SKIN: no acute rashes, no significant lesions EYES: conjunctiva are pink and non-injected, sclera anicteric OROPHARYNX: MMM, no exudates, no oropharyngeal erythema or ulceration NECK: supple, no JVD LYMPH:  no palpable  lymphadenopathy in the cervical, axillary or inguinal regions LUNGS: clear to auscultation b/l with normal respiratory effort HEART: regular rate & rhythm ABDOMEN:  normoactive bowel sounds , non tender, not distended. Extremity: no pedal edema PSYCH: alert & oriented x 3 with fluent speech NEURO: no focal motor/sensory deficits  LABORATORY DATA:  I have reviewed the data as listed  . CBC Latest Ref Rng & Units 02/22/2020 02/15/2020 02/08/2020  WBC 4.0 - 10.5 K/uL 4.1 4.9 5.0  Hemoglobin 12.0 - 15.0 g/dL 14.3 15.0 14.8  Hematocrit 36 - 46 % 42.9  44.1 43.8  Platelets 150 - 400 K/uL 167 87(L) 96(L)    . CMP Latest Ref Rng & Units 02/22/2020 02/15/2020 01/11/2020  Glucose 70 - 99 mg/dL 92 98 95  BUN 8 - 23 mg/dL 18 16 16   Creatinine 0.44 - 1.00 mg/dL 0.83 0.81 0.77  Sodium 135 - 145 mmol/L 149(H) 144 146(H)  Potassium 3.5 - 5.1 mmol/L 4.3 4.7 4.5  Chloride 98 - 111 mmol/L 109 108 108  CO2 22 - 32 mmol/L 28 26 28   Calcium 8.9 - 10.3 mg/dL 9.9 10.0 9.6  Total Protein 6.5 - 8.1 g/dL 6.3(L) 6.4(L) 6.4(L)  Total Bilirubin 0.3 - 1.2 mg/dL 0.7 0.6 0.8  Alkaline Phos 38 - 126 U/L 89 97 95  AST 15 - 41 U/L 21 22 22   ALT 0 - 44 U/L 14 15 17    02/27/2019 FISH:    RADIOGRAPHIC STUDIES: I have personally reviewed the radiological images as listed and agreed with the findings in the report. No results found.  ASSESSMENT & PLAN:   #1 CLL Previous CLL prognostic FISH panel without detectable mutations. Has been Rai stage 0.. Now with new anemia and severe thrombocytopenia. WBC counts have increased significantly to 84k from 39k 3 months ago.  Concerning for change in tempo of her disease. -08/31/2019 CT C/A/P ([id number]) ([id number]) revealed "1. Mild multistation lymphadenopathy in the retroperitoneum and bilateral pelvis as detailed, compatible with lymphoma. Normal size spleen. 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  However cannot rule out bone marrow involvement as an etiology of thrombocytopenia as well.  #3 new mild normocytic anemia.  Hemoglobin 11.7 with an MCV of 97.4. --- now resolved hgb today 14.8 Likely related to her CLL. 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  #4 h/o intraparenchymal bleed- rpt CT head stable (due to uncontrolled HTN and thrombocytopenia)  PLAN: -Discussed pt labwork today, 02/22/20; of CBC w/diff and CMP is as follows: all values are WNL except for Neutro Abs at 1.3K, Sodium at 149, Total Protein at 6.3 -Discussed 02/22/20 of LDH at 191: WNL -Advised on consideration of Promacta insteadvs continuation of NPlate  -Advised on N-plate shot to help platelets -currently at 37mcg/kg per week  -Advised on oral medications vs shots in addition to Venetoclax to treat platelets  -Advised on trying to get off N-plate shot over the next several weeks -Pt blood pressure has been controlled  -Goal Platelets >50K -Continue 200 mg of Venetoclax -currently tolerating well -no bleeding issues. No headache or new FND. -will keep at  68mcg/kg of N-plate with a plan to try to wean this off with a hope that with her CLL controlled her CLL related ITPwill progressively resolve. -Pt prefers continuing NPlate shot  -Recommends staying hydrated  -Will see back in 1 month  FOLLOW UP: Plz continue weekly labs and Nplate x 12 weeks MD visit in 8 weeks  The total time spent in the appt was 30 minutes and more than 50% was on counseling and direct patient cares.  All of the patient's questions were answered with apparent satisfaction. The patient knows to call the clinic with any problems, questions or concerns.  Sullivan Lone MD [place] AAHIVMS [facility] Hematology/Oncology Physician [facility]  (Office):       [phone] (Work cell):  [phone] (Fax):  [phone]  02/22/2020 10:00 AM  I, Dawayne Cirri am acting as a Education administrator for Dr. Sullivan Lone.  .I have reviewed the above documentation for accuracy and completeness, and I agree with the above. [name] MD

## 2020-02-22 ENCOUNTER — Inpatient Hospital Stay: Payer: PPO

## 2020-02-22 ENCOUNTER — Other Ambulatory Visit: Payer: Self-pay

## 2020-02-22 ENCOUNTER — Inpatient Hospital Stay (HOSPITAL_BASED_OUTPATIENT_CLINIC_OR_DEPARTMENT_OTHER): Payer: PPO | Admitting: Hematology

## 2020-02-22 VITALS — BP 168/64 | HR 66 | Temp 98.1°F | Resp 17 | Ht <= 58 in | Wt 101.2 lb

## 2020-02-22 DIAGNOSIS — D693 Immune thrombocytopenic purpura: Secondary | ICD-10-CM

## 2020-02-22 DIAGNOSIS — I629 Nontraumatic intracranial hemorrhage, unspecified: Secondary | ICD-10-CM

## 2020-02-22 DIAGNOSIS — C9110 Chronic lymphocytic leukemia of B-cell type not having achieved remission: Secondary | ICD-10-CM

## 2020-02-22 LAB — CBC WITH DIFFERENTIAL/PLATELET
Abs Immature Granulocytes: 0.03 10*3/uL (ref 0.00–0.07)
Basophils Absolute: 0 10*3/uL (ref 0.0–0.1)
Basophils Relative: 1 %
Eosinophils Absolute: 0 10*3/uL (ref 0.0–0.5)
Eosinophils Relative: 0 %
HCT: 42.9 % (ref 36.0–46.0)
Hemoglobin: 14.3 g/dL (ref 12.0–15.0)
Immature Granulocytes: 1 %
Lymphocytes Relative: 50 %
Lymphs Abs: 2.1 10*3/uL (ref 0.7–4.0)
MCH: 29.7 pg (ref 26.0–34.0)
MCHC: 33.3 g/dL (ref 30.0–36.0)
MCV: 89 fL (ref 80.0–100.0)
Monocytes Absolute: 0.7 10*3/uL (ref 0.1–1.0)
Monocytes Relative: 17 %
Neutro Abs: 1.3 10*3/uL — ABNORMAL LOW (ref 1.7–7.7)
Neutrophils Relative %: 31 %
Platelets: 167 10*3/uL (ref 150–400)
RBC: 4.82 MIL/uL (ref 3.87–5.11)
RDW: 12.9 % (ref 11.5–15.5)
WBC: 4.1 10*3/uL (ref 4.0–10.5)
nRBC: 0 % (ref 0.0–0.2)

## 2020-02-22 LAB — CMP (CANCER CENTER ONLY)
ALT: 14 U/L (ref 0–44)
AST: 21 U/L (ref 15–41)
Albumin: 3.8 g/dL (ref 3.5–5.0)
Alkaline Phosphatase: 89 U/L (ref 38–126)
Anion gap: 12 (ref 5–15)
BUN: 18 mg/dL (ref 8–23)
CO2: 28 mmol/L (ref 22–32)
Calcium: 9.9 mg/dL (ref 8.9–10.3)
Chloride: 109 mmol/L (ref 98–111)
Creatinine: 0.83 mg/dL (ref 0.44–1.00)
GFR, Est AFR Am: >60 mL/min (ref >60)
GFR, Estimated: >60 mL/min (ref >60)
Glucose, Bld: 92 mg/dL (ref 70–99)
Potassium: 4.3 mmol/L (ref 3.5–5.1)
Sodium: 149 mmol/L — ABNORMAL HIGH (ref 135–145)
Total Bilirubin: 0.7 mg/dL (ref 0.3–1.2)
Total Protein: 6.3 g/dL — ABNORMAL LOW (ref 6.5–8.1)

## 2020-02-22 LAB — LACTATE DEHYDROGENASE: LDH: 191 U/L (ref 98–192)

## 2020-02-22 MED ORDER — ROMIPLOSTIM 250 MCG ~~LOC~~ SOLR
3.0000 ug/kg | SUBCUTANEOUS | Status: DC
  Administered 2020-02-22: 140 ug via SUBCUTANEOUS
  Filled 2020-02-22: qty 0.28

## 2020-02-25 ENCOUNTER — Telehealth: Payer: Self-pay | Admitting: Hematology

## 2020-02-25 NOTE — Telephone Encounter (Signed)
Scheduled per 06/11 los, patient has been called and notified.

## 2020-02-29 ENCOUNTER — Inpatient Hospital Stay: Payer: PPO

## 2020-02-29 ENCOUNTER — Other Ambulatory Visit: Payer: Self-pay | Admitting: Hematology

## 2020-02-29 ENCOUNTER — Other Ambulatory Visit: Payer: Self-pay

## 2020-02-29 VITALS — BP 142/52 | HR 70 | Temp 98.6°F | Resp 18

## 2020-02-29 DIAGNOSIS — D693 Immune thrombocytopenic purpura: Secondary | ICD-10-CM

## 2020-02-29 DIAGNOSIS — I629 Nontraumatic intracranial hemorrhage, unspecified: Secondary | ICD-10-CM

## 2020-02-29 DIAGNOSIS — C9110 Chronic lymphocytic leukemia of B-cell type not having achieved remission: Secondary | ICD-10-CM

## 2020-02-29 LAB — CBC WITH DIFFERENTIAL/PLATELET
Abs Immature Granulocytes: 0.01 10*3/uL (ref 0.00–0.07)
Basophils Absolute: 0 10*3/uL (ref 0.0–0.1)
Basophils Relative: 0 %
Eosinophils Absolute: 0 10*3/uL (ref 0.0–0.5)
Eosinophils Relative: 0 %
HCT: 41.7 % (ref 36.0–46.0)
Hemoglobin: 14.1 g/dL (ref 12.0–15.0)
Immature Granulocytes: 0 %
Lymphocytes Relative: 49 %
Lymphs Abs: 2.4 10*3/uL (ref 0.7–4.0)
MCH: 29.8 pg (ref 26.0–34.0)
MCHC: 33.8 g/dL (ref 30.0–36.0)
MCV: 88.2 fL (ref 80.0–100.0)
Monocytes Absolute: 0.8 10*3/uL (ref 0.1–1.0)
Monocytes Relative: 16 %
Neutro Abs: 1.7 10*3/uL (ref 1.7–7.7)
Neutrophils Relative %: 35 %
Platelets: 231 10*3/uL (ref 150–400)
RBC: 4.73 MIL/uL (ref 3.87–5.11)
RDW: 12.7 % (ref 11.5–15.5)
WBC: 4.9 10*3/uL (ref 4.0–10.5)
nRBC: 0 % (ref 0.0–0.2)

## 2020-02-29 MED ORDER — ROMIPLOSTIM 125 MCG ~~LOC~~ SOLR
95.0000 ug | SUBCUTANEOUS | Status: DC
  Administered 2020-02-29: 95 ug via SUBCUTANEOUS
  Filled 2020-02-29: qty 0.19

## 2020-02-29 NOTE — Patient Instructions (Signed)
Romiplostim injection What is this medicine? ROMIPLOSTIM (roe mi PLOE stim) helps your body make more platelets. This medicine is used to treat low platelets caused by chronic idiopathic thrombocytopenic purpura (ITP). This medicine may be used for other purposes; ask your health care provider or pharmacist if you have questions. COMMON BRAND NAME(S): Nplate What should I tell my health care provider before I take this medicine? They need to know if you have any of these conditions:  bleeding disorders  bone marrow problem, like blood cancer or myelodysplastic syndrome  history of blood clots  liver disease  surgery to remove your spleen  an unusual or allergic reaction to romiplostim, mannitol, other medicines, foods, dyes, or preservatives  pregnant or trying to get pregnant  breast-feeding How should I use this medicine? This medicine is for injection under the skin. It is given by a health care professional in a hospital or clinic setting. A special MedGuide will be given to you before your injection. Read this information carefully each time. Talk to your pediatrician regarding the use of this medicine in children. While this drug may be prescribed for children as young as 1 year for selected conditions, precautions do apply. Overdosage: If you think you have taken too much of this medicine contact a poison control center or emergency room at once. NOTE: This medicine is only for you. Do not share this medicine with others. What if I miss a dose? It is important not to miss your dose. Call your doctor or health care professional if you are unable to keep an appointment. What may interact with this medicine? Interactions are not expected. This list may not describe all possible interactions. Give your health care provider a list of all the medicines, herbs, non-prescription drugs, or dietary supplements you use. Also tell them if you smoke, drink alcohol, or use illegal drugs.  Some items may interact with your medicine. What should I watch for while using this medicine? Your condition will be monitored carefully while you are receiving this medicine. Visit your prescriber or health care professional for regular checks on your progress and for the needed blood tests. It is important to keep all appointments. What side effects may I notice from receiving this medicine? Side effects that you should report to your doctor or health care professional as soon as possible:  allergic reactions like skin rash, itching or hives, swelling of the face, lips, or tongue  signs and symptoms of bleeding such as bloody or black, tarry stools; red or dark brown urine; spitting up blood or brown material that looks like coffee grounds; red spots on the skin; unusual bruising or bleeding from the eyes, gums, or nose  signs and symptoms of a blood clot such as chest pain; shortness of breath; pain, swelling, or warmth in the leg  signs and symptoms of a stroke like changes in vision; confusion; trouble speaking or understanding; severe headaches; sudden numbness or weakness of the face, arm or leg; trouble walking; dizziness; loss of balance or coordination Side effects that usually do not require medical attention (report to your doctor or health care professional if they continue or are bothersome):  headache  pain in arms and legs  pain in mouth  stomach pain This list may not describe all possible side effects. Call your doctor for medical advice about side effects. You may report side effects to FDA at 1-800-FDA-1088. Where should I keep my medicine? This drug is given in a hospital or clinic   and will not be stored at home. NOTE: This sheet is a summary. It may not cover all possible information. If you have questions about this medicine, talk to your doctor, pharmacist, or health care provider.  2020 Elsevier/Gold Standard (2017-08-29 11:10:55)  

## 2020-03-04 MED FILL — VENCLEXTA 100 MG TABS: 100 | 30 days supply | Qty: 60 | Fill #0

## 2020-03-06 DIAGNOSIS — H5213 Myopia, bilateral: Secondary | ICD-10-CM | POA: Diagnosis not present

## 2020-03-06 DIAGNOSIS — Z9842 Cataract extraction status, left eye: Secondary | ICD-10-CM | POA: Diagnosis not present

## 2020-03-06 DIAGNOSIS — H401111 Primary open-angle glaucoma, right eye, mild stage: Secondary | ICD-10-CM | POA: Diagnosis not present

## 2020-03-06 DIAGNOSIS — H52223 Regular astigmatism, bilateral: Secondary | ICD-10-CM | POA: Diagnosis not present

## 2020-03-06 DIAGNOSIS — H409 Unspecified glaucoma: Secondary | ICD-10-CM | POA: Diagnosis not present

## 2020-03-06 DIAGNOSIS — H2513 Age-related nuclear cataract, bilateral: Secondary | ICD-10-CM | POA: Diagnosis not present

## 2020-03-06 DIAGNOSIS — Z961 Presence of intraocular lens: Secondary | ICD-10-CM | POA: Diagnosis not present

## 2020-03-06 DIAGNOSIS — H25811 Combined forms of age-related cataract, right eye: Secondary | ICD-10-CM | POA: Diagnosis not present

## 2020-03-06 DIAGNOSIS — H2511 Age-related nuclear cataract, right eye: Secondary | ICD-10-CM | POA: Diagnosis not present

## 2020-03-07 ENCOUNTER — Inpatient Hospital Stay: Payer: PPO

## 2020-03-07 ENCOUNTER — Other Ambulatory Visit: Payer: Self-pay

## 2020-03-07 VITALS — BP 142/78 | HR 71 | Resp 18

## 2020-03-07 DIAGNOSIS — D693 Immune thrombocytopenic purpura: Secondary | ICD-10-CM

## 2020-03-07 DIAGNOSIS — C9110 Chronic lymphocytic leukemia of B-cell type not having achieved remission: Secondary | ICD-10-CM

## 2020-03-07 DIAGNOSIS — I629 Nontraumatic intracranial hemorrhage, unspecified: Secondary | ICD-10-CM

## 2020-03-07 LAB — CBC WITH DIFFERENTIAL/PLATELET
Abs Immature Granulocytes: 0.02 10*3/uL (ref 0.00–0.07)
Basophils Absolute: 0 10*3/uL (ref 0.0–0.1)
Basophils Relative: 0 %
Eosinophils Absolute: 0 10*3/uL (ref 0.0–0.5)
Eosinophils Relative: 0 %
HCT: 44.7 % (ref 36.0–46.0)
Hemoglobin: 15 g/dL (ref 12.0–15.0)
Immature Granulocytes: 0 %
Lymphocytes Relative: 51 %
Lymphs Abs: 2.5 10*3/uL (ref 0.7–4.0)
MCH: 30.2 pg (ref 26.0–34.0)
MCHC: 33.6 g/dL (ref 30.0–36.0)
MCV: 90.1 fL (ref 80.0–100.0)
Monocytes Absolute: 0.7 10*3/uL (ref 0.1–1.0)
Monocytes Relative: 15 %
Neutro Abs: 1.7 10*3/uL (ref 1.7–7.7)
Neutrophils Relative %: 34 %
Platelets: 184 10*3/uL (ref 150–400)
RBC: 4.96 MIL/uL (ref 3.87–5.11)
RDW: 12.7 % (ref 11.5–15.5)
WBC: 5.1 10*3/uL (ref 4.0–10.5)
nRBC: 0 % (ref 0.0–0.2)

## 2020-03-07 MED ORDER — ROMIPLOSTIM 125 MCG ~~LOC~~ SOLR
95.0000 ug | SUBCUTANEOUS | Status: DC
  Administered 2020-03-07: 95 ug via SUBCUTANEOUS
  Filled 2020-03-07: qty 0.19

## 2020-03-14 ENCOUNTER — Inpatient Hospital Stay: Payer: PPO | Attending: Hematology

## 2020-03-14 ENCOUNTER — Inpatient Hospital Stay: Payer: PPO

## 2020-03-14 ENCOUNTER — Other Ambulatory Visit: Payer: Self-pay | Admitting: *Deleted

## 2020-03-14 ENCOUNTER — Other Ambulatory Visit: Payer: Self-pay

## 2020-03-14 VITALS — BP 155/58 | HR 69 | Temp 98.3°F | Resp 18

## 2020-03-14 DIAGNOSIS — D693 Immune thrombocytopenic purpura: Secondary | ICD-10-CM

## 2020-03-14 DIAGNOSIS — C9110 Chronic lymphocytic leukemia of B-cell type not having achieved remission: Secondary | ICD-10-CM

## 2020-03-14 DIAGNOSIS — I629 Nontraumatic intracranial hemorrhage, unspecified: Secondary | ICD-10-CM

## 2020-03-14 DIAGNOSIS — Z79899 Other long term (current) drug therapy: Secondary | ICD-10-CM | POA: Diagnosis not present

## 2020-03-14 DIAGNOSIS — Z801 Family history of malignant neoplasm of trachea, bronchus and lung: Secondary | ICD-10-CM | POA: Diagnosis not present

## 2020-03-14 DIAGNOSIS — I10 Essential (primary) hypertension: Secondary | ICD-10-CM | POA: Insufficient documentation

## 2020-03-14 DIAGNOSIS — D649 Anemia, unspecified: Secondary | ICD-10-CM | POA: Insufficient documentation

## 2020-03-14 LAB — CBC WITH DIFFERENTIAL/PLATELET
Abs Immature Granulocytes: 0.03 10*3/uL (ref 0.00–0.07)
Basophils Absolute: 0 10*3/uL (ref 0.0–0.1)
Basophils Relative: 0 %
Eosinophils Absolute: 0 10*3/uL (ref 0.0–0.5)
Eosinophils Relative: 0 %
HCT: 44.5 % (ref 36.0–46.0)
Hemoglobin: 14.7 g/dL (ref 12.0–15.0)
Immature Granulocytes: 1 %
Lymphocytes Relative: 45 %
Lymphs Abs: 2.2 10*3/uL (ref 0.7–4.0)
MCH: 29.7 pg (ref 26.0–34.0)
MCHC: 33 g/dL (ref 30.0–36.0)
MCV: 89.9 fL (ref 80.0–100.0)
Monocytes Absolute: 0.9 10*3/uL (ref 0.1–1.0)
Monocytes Relative: 18 %
Neutro Abs: 1.7 10*3/uL (ref 1.7–7.7)
Neutrophils Relative %: 36 %
Platelets: 170 10*3/uL (ref 150–400)
RBC: 4.95 MIL/uL (ref 3.87–5.11)
RDW: 12.9 % (ref 11.5–15.5)
WBC: 4.8 10*3/uL (ref 4.0–10.5)
nRBC: 0 % (ref 0.0–0.2)

## 2020-03-14 MED ORDER — ROMIPLOSTIM 125 MCG ~~LOC~~ SOLR
95.0000 ug | SUBCUTANEOUS | Status: DC
  Administered 2020-03-14: 95 ug via SUBCUTANEOUS
  Filled 2020-03-14: qty 0.19

## 2020-03-21 ENCOUNTER — Inpatient Hospital Stay: Payer: PPO

## 2020-03-21 ENCOUNTER — Other Ambulatory Visit: Payer: Self-pay

## 2020-03-21 VITALS — BP 168/64 | HR 66 | Temp 98.3°F | Resp 18

## 2020-03-21 DIAGNOSIS — D693 Immune thrombocytopenic purpura: Secondary | ICD-10-CM

## 2020-03-21 DIAGNOSIS — C9110 Chronic lymphocytic leukemia of B-cell type not having achieved remission: Secondary | ICD-10-CM

## 2020-03-21 DIAGNOSIS — I629 Nontraumatic intracranial hemorrhage, unspecified: Secondary | ICD-10-CM

## 2020-03-21 LAB — CMP (CANCER CENTER ONLY)
ALT: 13 U/L (ref 0–44)
AST: 22 U/L (ref 15–41)
Albumin: 3.9 g/dL (ref 3.5–5.0)
Alkaline Phosphatase: 99 U/L (ref 38–126)
Anion gap: 8 (ref 5–15)
BUN: 23 mg/dL (ref 8–23)
CO2: 27 mmol/L (ref 22–32)
Calcium: 9.9 mg/dL (ref 8.9–10.3)
Chloride: 108 mmol/L (ref 98–111)
Creatinine: 0.82 mg/dL (ref 0.44–1.00)
GFR, Est AFR Am: >60 mL/min (ref >60)
GFR, Estimated: >60 mL/min (ref >60)
Glucose, Bld: 93 mg/dL (ref 70–99)
Potassium: 4.8 mmol/L (ref 3.5–5.1)
Sodium: 143 mmol/L (ref 135–145)
Total Bilirubin: 0.7 mg/dL (ref 0.3–1.2)
Total Protein: 6.5 g/dL (ref 6.5–8.1)

## 2020-03-21 LAB — CBC WITH DIFFERENTIAL/PLATELET
Abs Immature Granulocytes: 0.04 10*3/uL (ref 0.00–0.07)
Basophils Absolute: 0 10*3/uL (ref 0.0–0.1)
Basophils Relative: 0 %
Eosinophils Absolute: 0 10*3/uL (ref 0.0–0.5)
Eosinophils Relative: 0 %
HCT: 42.6 % (ref 36.0–46.0)
Hemoglobin: 14.3 g/dL (ref 12.0–15.0)
Immature Granulocytes: 1 %
Lymphocytes Relative: 50 %
Lymphs Abs: 2.5 10*3/uL (ref 0.7–4.0)
MCH: 30.3 pg (ref 26.0–34.0)
MCHC: 33.6 g/dL (ref 30.0–36.0)
MCV: 90.3 fL (ref 80.0–100.0)
Monocytes Absolute: 0.7 10*3/uL (ref 0.1–1.0)
Monocytes Relative: 14 %
Neutro Abs: 1.8 10*3/uL (ref 1.7–7.7)
Neutrophils Relative %: 35 %
Platelets: 172 10*3/uL (ref 150–400)
RBC: 4.72 MIL/uL (ref 3.87–5.11)
RDW: 12.7 % (ref 11.5–15.5)
WBC: 5 10*3/uL (ref 4.0–10.5)
nRBC: 0 % (ref 0.0–0.2)

## 2020-03-21 MED ORDER — ROMIPLOSTIM 125 MCG ~~LOC~~ SOLR
95.0000 ug | SUBCUTANEOUS | Status: DC
  Administered 2020-03-21: 95 ug via SUBCUTANEOUS
  Filled 2020-03-21: qty 0.19

## 2020-03-28 ENCOUNTER — Inpatient Hospital Stay: Payer: PPO

## 2020-03-28 ENCOUNTER — Other Ambulatory Visit: Payer: Self-pay

## 2020-03-28 VITALS — BP 156/60 | HR 63 | Temp 98.7°F | Resp 16

## 2020-03-28 DIAGNOSIS — D693 Immune thrombocytopenic purpura: Secondary | ICD-10-CM | POA: Diagnosis not present

## 2020-03-28 DIAGNOSIS — C9110 Chronic lymphocytic leukemia of B-cell type not having achieved remission: Secondary | ICD-10-CM

## 2020-03-28 DIAGNOSIS — I629 Nontraumatic intracranial hemorrhage, unspecified: Secondary | ICD-10-CM

## 2020-03-28 LAB — CBC WITH DIFFERENTIAL/PLATELET
Abs Immature Granulocytes: 0.08 K/uL — ABNORMAL HIGH (ref 0.00–0.07)
Basophils Absolute: 0 K/uL (ref 0.0–0.1)
Basophils Relative: 0 %
Eosinophils Absolute: 0 K/uL (ref 0.0–0.5)
Eosinophils Relative: 0 %
HCT: 45.1 % (ref 36.0–46.0)
Hemoglobin: 14.8 g/dL (ref 12.0–15.0)
Immature Granulocytes: 2 %
Lymphocytes Relative: 49 %
Lymphs Abs: 2.5 K/uL (ref 0.7–4.0)
MCH: 29.5 pg (ref 26.0–34.0)
MCHC: 32.8 g/dL (ref 30.0–36.0)
MCV: 89.8 fL (ref 80.0–100.0)
Monocytes Absolute: 0.7 K/uL (ref 0.1–1.0)
Monocytes Relative: 14 %
Neutro Abs: 1.8 K/uL (ref 1.7–7.7)
Neutrophils Relative %: 35 %
Platelets: 132 K/uL — ABNORMAL LOW (ref 150–400)
RBC: 5.02 MIL/uL (ref 3.87–5.11)
RDW: 12.8 % (ref 11.5–15.5)
WBC: 5.1 K/uL (ref 4.0–10.5)
nRBC: 0 % (ref 0.0–0.2)

## 2020-03-28 MED ORDER — ROMIPLOSTIM 125 MCG ~~LOC~~ SOLR
95.0000 ug | SUBCUTANEOUS | Status: DC
  Administered 2020-03-28: 95 ug via SUBCUTANEOUS
  Filled 2020-03-28: qty 0.19

## 2020-03-28 NOTE — Patient Instructions (Signed)
Romiplostim injection What is this medicine? ROMIPLOSTIM (roe mi PLOE stim) helps your body make more platelets. This medicine is used to treat low platelets caused by chronic idiopathic thrombocytopenic purpura (ITP). This medicine may be used for other purposes; ask your health care provider or pharmacist if you have questions. COMMON BRAND NAME(S): Nplate What should I tell my health care provider before I take this medicine? They need to know if you have any of these conditions:  bleeding disorders  bone marrow problem, like blood cancer or myelodysplastic syndrome  history of blood clots  liver disease  surgery to remove your spleen  an unusual or allergic reaction to romiplostim, mannitol, other medicines, foods, dyes, or preservatives  pregnant or trying to get pregnant  breast-feeding How should I use this medicine? This medicine is for injection under the skin. It is given by a health care professional in a hospital or clinic setting. A special MedGuide will be given to you before your injection. Read this information carefully each time. Talk to your pediatrician regarding the use of this medicine in children. While this drug may be prescribed for children as young as 1 year for selected conditions, precautions do apply. Overdosage: If you think you have taken too much of this medicine contact a poison control center or emergency room at once. NOTE: This medicine is only for you. Do not share this medicine with others. What if I miss a dose? It is important not to miss your dose. Call your doctor or health care professional if you are unable to keep an appointment. What may interact with this medicine? Interactions are not expected. This list may not describe all possible interactions. Give your health care provider a list of all the medicines, herbs, non-prescription drugs, or dietary supplements you use. Also tell them if you smoke, drink alcohol, or use illegal drugs.  Some items may interact with your medicine. What should I watch for while using this medicine? Your condition will be monitored carefully while you are receiving this medicine. Visit your prescriber or health care professional for regular checks on your progress and for the needed blood tests. It is important to keep all appointments. What side effects may I notice from receiving this medicine? Side effects that you should report to your doctor or health care professional as soon as possible:  allergic reactions like skin rash, itching or hives, swelling of the face, lips, or tongue  signs and symptoms of bleeding such as bloody or black, tarry stools; red or dark brown urine; spitting up blood or brown material that looks like coffee grounds; red spots on the skin; unusual bruising or bleeding from the eyes, gums, or nose  signs and symptoms of a blood clot such as chest pain; shortness of breath; pain, swelling, or warmth in the leg  signs and symptoms of a stroke like changes in vision; confusion; trouble speaking or understanding; severe headaches; sudden numbness or weakness of the face, arm or leg; trouble walking; dizziness; loss of balance or coordination Side effects that usually do not require medical attention (report to your doctor or health care professional if they continue or are bothersome):  headache  pain in arms and legs  pain in mouth  stomach pain This list may not describe all possible side effects. Call your doctor for medical advice about side effects. You may report side effects to FDA at 1-800-FDA-1088. Where should I keep my medicine? This drug is given in a hospital or clinic   and will not be stored at home. NOTE: This sheet is a summary. It may not cover all possible information. If you have questions about this medicine, talk to your doctor, pharmacist, or health care provider.  2020 Elsevier/Gold Standard (2017-08-29 11:10:55)  

## 2020-03-31 MED FILL — VENCLEXTA 100 MG TABS: 100 | 30 days supply | Qty: 60 | Fill #1

## 2020-04-04 ENCOUNTER — Inpatient Hospital Stay: Payer: PPO

## 2020-04-04 ENCOUNTER — Other Ambulatory Visit: Payer: Self-pay

## 2020-04-04 VITALS — BP 156/64 | HR 63 | Resp 18

## 2020-04-04 DIAGNOSIS — I629 Nontraumatic intracranial hemorrhage, unspecified: Secondary | ICD-10-CM

## 2020-04-04 DIAGNOSIS — D693 Immune thrombocytopenic purpura: Secondary | ICD-10-CM | POA: Diagnosis not present

## 2020-04-04 DIAGNOSIS — C9110 Chronic lymphocytic leukemia of B-cell type not having achieved remission: Secondary | ICD-10-CM

## 2020-04-04 LAB — CBC WITH DIFFERENTIAL/PLATELET
Abs Immature Granulocytes: 0.01 10*3/uL (ref 0.00–0.07)
Basophils Absolute: 0 10*3/uL (ref 0.0–0.1)
Basophils Relative: 0 %
Eosinophils Absolute: 0 10*3/uL (ref 0.0–0.5)
Eosinophils Relative: 0 %
HCT: 42.2 % (ref 36.0–46.0)
Hemoglobin: 14.3 g/dL (ref 12.0–15.0)
Immature Granulocytes: 0 %
Lymphocytes Relative: 49 %
Lymphs Abs: 2.3 10*3/uL (ref 0.7–4.0)
MCH: 29.8 pg (ref 26.0–34.0)
MCHC: 33.9 g/dL (ref 30.0–36.0)
MCV: 87.9 fL (ref 80.0–100.0)
Monocytes Absolute: 0.7 10*3/uL (ref 0.1–1.0)
Monocytes Relative: 16 %
Neutro Abs: 1.6 10*3/uL — ABNORMAL LOW (ref 1.7–7.7)
Neutrophils Relative %: 35 %
Platelets: 131 10*3/uL — ABNORMAL LOW (ref 150–400)
RBC: 4.8 MIL/uL (ref 3.87–5.11)
RDW: 12.7 % (ref 11.5–15.5)
WBC: 4.6 10*3/uL (ref 4.0–10.5)
nRBC: 0 % (ref 0.0–0.2)

## 2020-04-04 MED ORDER — ROMIPLOSTIM 125 MCG ~~LOC~~ SOLR
95.0000 ug | SUBCUTANEOUS | Status: DC
  Administered 2020-04-04: 95 ug via SUBCUTANEOUS
  Filled 2020-04-04: qty 0.19

## 2020-04-04 NOTE — Patient Instructions (Signed)
Romiplostim injection What is this medicine? ROMIPLOSTIM (roe mi PLOE stim) helps your body make more platelets. This medicine is used to treat low platelets caused by chronic idiopathic thrombocytopenic purpura (ITP). This medicine may be used for other purposes; ask your health care provider or pharmacist if you have questions. COMMON BRAND NAME(S): Nplate What should I tell my health care provider before I take this medicine? They need to know if you have any of these conditions:  bleeding disorders  bone marrow problem, like blood cancer or myelodysplastic syndrome  history of blood clots  liver disease  surgery to remove your spleen  an unusual or allergic reaction to romiplostim, mannitol, other medicines, foods, dyes, or preservatives  pregnant or trying to get pregnant  breast-feeding How should I use this medicine? This medicine is for injection under the skin. It is given by a health care professional in a hospital or clinic setting. A special MedGuide will be given to you before your injection. Read this information carefully each time. Talk to your pediatrician regarding the use of this medicine in children. While this drug may be prescribed for children as young as 1 year for selected conditions, precautions do apply. Overdosage: If you think you have taken too much of this medicine contact a poison control center or emergency room at once. NOTE: This medicine is only for you. Do not share this medicine with others. What if I miss a dose? It is important not to miss your dose. Call your doctor or health care professional if you are unable to keep an appointment. What may interact with this medicine? Interactions are not expected. This list may not describe all possible interactions. Give your health care provider a list of all the medicines, herbs, non-prescription drugs, or dietary supplements you use. Also tell them if you smoke, drink alcohol, or use illegal drugs.  Some items may interact with your medicine. What should I watch for while using this medicine? Your condition will be monitored carefully while you are receiving this medicine. Visit your prescriber or health care professional for regular checks on your progress and for the needed blood tests. It is important to keep all appointments. What side effects may I notice from receiving this medicine? Side effects that you should report to your doctor or health care professional as soon as possible:  allergic reactions like skin rash, itching or hives, swelling of the face, lips, or tongue  signs and symptoms of bleeding such as bloody or black, tarry stools; red or dark brown urine; spitting up blood or brown material that looks like coffee grounds; red spots on the skin; unusual bruising or bleeding from the eyes, gums, or nose  signs and symptoms of a blood clot such as chest pain; shortness of breath; pain, swelling, or warmth in the leg  signs and symptoms of a stroke like changes in vision; confusion; trouble speaking or understanding; severe headaches; sudden numbness or weakness of the face, arm or leg; trouble walking; dizziness; loss of balance or coordination Side effects that usually do not require medical attention (report to your doctor or health care professional if they continue or are bothersome):  headache  pain in arms and legs  pain in mouth  stomach pain This list may not describe all possible side effects. Call your doctor for medical advice about side effects. You may report side effects to FDA at 1-800-FDA-1088. Where should I keep my medicine? This drug is given in a hospital or clinic   and will not be stored at home. NOTE: This sheet is a summary. It may not cover all possible information. If you have questions about this medicine, talk to your doctor, pharmacist, or health care provider.  2020 Elsevier/Gold Standard (2017-08-29 11:10:55)  

## 2020-04-10 DIAGNOSIS — H409 Unspecified glaucoma: Secondary | ICD-10-CM | POA: Diagnosis not present

## 2020-04-10 DIAGNOSIS — I10 Essential (primary) hypertension: Secondary | ICD-10-CM | POA: Diagnosis not present

## 2020-04-10 DIAGNOSIS — I618 Other nontraumatic intracerebral hemorrhage: Secondary | ICD-10-CM | POA: Diagnosis not present

## 2020-04-10 DIAGNOSIS — E782 Mixed hyperlipidemia: Secondary | ICD-10-CM | POA: Diagnosis not present

## 2020-04-10 DIAGNOSIS — M810 Age-related osteoporosis without current pathological fracture: Secondary | ICD-10-CM | POA: Diagnosis not present

## 2020-04-11 ENCOUNTER — Inpatient Hospital Stay: Payer: PPO

## 2020-04-11 ENCOUNTER — Other Ambulatory Visit: Payer: Self-pay

## 2020-04-11 VITALS — BP 165/65 | HR 62 | Temp 98.1°F | Resp 16

## 2020-04-11 DIAGNOSIS — D693 Immune thrombocytopenic purpura: Secondary | ICD-10-CM

## 2020-04-11 DIAGNOSIS — C9110 Chronic lymphocytic leukemia of B-cell type not having achieved remission: Secondary | ICD-10-CM

## 2020-04-11 DIAGNOSIS — I629 Nontraumatic intracranial hemorrhage, unspecified: Secondary | ICD-10-CM

## 2020-04-11 LAB — CBC WITH DIFFERENTIAL/PLATELET
Abs Immature Granulocytes: 0.05 10*3/uL (ref 0.00–0.07)
Basophils Absolute: 0 10*3/uL (ref 0.0–0.1)
Basophils Relative: 0 %
Eosinophils Absolute: 0 10*3/uL (ref 0.0–0.5)
Eosinophils Relative: 0 %
HCT: 43.1 % (ref 36.0–46.0)
Hemoglobin: 14.9 g/dL (ref 12.0–15.0)
Immature Granulocytes: 1 %
Lymphocytes Relative: 45 %
Lymphs Abs: 2.3 10*3/uL (ref 0.7–4.0)
MCH: 30.8 pg (ref 26.0–34.0)
MCHC: 34.6 g/dL (ref 30.0–36.0)
MCV: 89.2 fL (ref 80.0–100.0)
Monocytes Absolute: 0.8 10*3/uL (ref 0.1–1.0)
Monocytes Relative: 16 %
Neutro Abs: 1.9 10*3/uL (ref 1.7–7.7)
Neutrophils Relative %: 38 %
Platelets: 157 10*3/uL (ref 150–400)
RBC: 4.83 MIL/uL (ref 3.87–5.11)
RDW: 12.7 % (ref 11.5–15.5)
WBC: 5 10*3/uL (ref 4.0–10.5)
nRBC: 0 % (ref 0.0–0.2)

## 2020-04-11 MED ORDER — ROMIPLOSTIM 125 MCG ~~LOC~~ SOLR
95.0000 ug | SUBCUTANEOUS | Status: DC
  Administered 2020-04-11: 95 ug via SUBCUTANEOUS
  Filled 2020-04-11: qty 0.19

## 2020-04-11 NOTE — Patient Instructions (Signed)
Romiplostim injection What is this medicine? ROMIPLOSTIM (roe mi PLOE stim) helps your body make more platelets. This medicine is used to treat low platelets caused by chronic idiopathic thrombocytopenic purpura (ITP). This medicine may be used for other purposes; ask your health care provider or pharmacist if you have questions. COMMON BRAND NAME(S): Nplate What should I tell my health care provider before I take this medicine? They need to know if you have any of these conditions:  bleeding disorders  bone marrow problem, like blood cancer or myelodysplastic syndrome  history of blood clots  liver disease  surgery to remove your spleen  an unusual or allergic reaction to romiplostim, mannitol, other medicines, foods, dyes, or preservatives  pregnant or trying to get pregnant  breast-feeding How should I use this medicine? This medicine is for injection under the skin. It is given by a health care professional in a hospital or clinic setting. A special MedGuide will be given to you before your injection. Read this information carefully each time. Talk to your pediatrician regarding the use of this medicine in children. While this drug may be prescribed for children as young as 1 year for selected conditions, precautions do apply. Overdosage: If you think you have taken too much of this medicine contact a poison control center or emergency room at once. NOTE: This medicine is only for you. Do not share this medicine with others. What if I miss a dose? It is important not to miss your dose. Call your doctor or health care professional if you are unable to keep an appointment. What may interact with this medicine? Interactions are not expected. This list may not describe all possible interactions. Give your health care provider a list of all the medicines, herbs, non-prescription drugs, or dietary supplements you use. Also tell them if you smoke, drink alcohol, or use illegal drugs.  Some items may interact with your medicine. What should I watch for while using this medicine? Your condition will be monitored carefully while you are receiving this medicine. Visit your prescriber or health care professional for regular checks on your progress and for the needed blood tests. It is important to keep all appointments. What side effects may I notice from receiving this medicine? Side effects that you should report to your doctor or health care professional as soon as possible:  allergic reactions like skin rash, itching or hives, swelling of the face, lips, or tongue  signs and symptoms of bleeding such as bloody or black, tarry stools; red or dark brown urine; spitting up blood or brown material that looks like coffee grounds; red spots on the skin; unusual bruising or bleeding from the eyes, gums, or nose  signs and symptoms of a blood clot such as chest pain; shortness of breath; pain, swelling, or warmth in the leg  signs and symptoms of a stroke like changes in vision; confusion; trouble speaking or understanding; severe headaches; sudden numbness or weakness of the face, arm or leg; trouble walking; dizziness; loss of balance or coordination Side effects that usually do not require medical attention (report to your doctor or health care professional if they continue or are bothersome):  headache  pain in arms and legs  pain in mouth  stomach pain This list may not describe all possible side effects. Call your doctor for medical advice about side effects. You may report side effects to FDA at 1-800-FDA-1088. Where should I keep my medicine? This drug is given in a hospital or clinic   and will not be stored at home. NOTE: This sheet is a summary. It may not cover all possible information. If you have questions about this medicine, talk to your doctor, pharmacist, or health care provider.  2020 Elsevier/Gold Standard (2017-08-29 11:10:55)  

## 2020-04-16 NOTE — Progress Notes (Signed)
HEMATOLOGY/ONCOLOGY CLINIC NOTE  Date of Service: 04/18/2020  Patient Care Team: [name], MD as PCP - General (Family Medicine) [name], MD as Consulting Physician (Hematology)  CHIEF COMPLAINTS/PURPOSE OF CONSULTATION:  F/u for CLL and ITP  HISTORY OF PRESENTING ILLNESS:   Selena Branch is a wonderful 79 y.o. female who has been transferred to [place] from Dr. Mathis Dad [place] for evaluation and management of CLL. The pt reports that she is doing well overall.  The pt reports that has been slightly fatigued in the past 1-2 months, but thinks it is from the heat. The fatigue does not affect her ability to function. Denies fevers, chills, and night sweats. She reports that she saw her PCP in August, who noticed that her blood counts were high and referred her to Dr. Irene Limbo.  Lab results today (05/31/2019) of CBC w diff and CMP is as follows: all values are WNL except for WBC at 39.1k, neutro abs at 1.6k, lymphs abs at 36.8k, monocytes abs at 0.0k, eosinophils abs at 0.8k, BUN at 27, Total protein at 6.4 05/31/2019 LDH is pending  On review of systems, pt reports mild fatigue and denies belly pain, infection issues and any other symptoms.   INTERVAL HISTORY:  Selena Branch is a 79 y.o. 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 Pt is joined today by her daughter.  The patient's last visit with [place] was on 02/22/20. The pt reports that she is doing well overall.  The pt reports she is good. Pt is eating well and sleeping well. In the morning when she taking Venetoclax it makes her use the bathroom more. It only last about an hour then it settles down.   Lab results today (04/18/20) of CBC w/diff and CMP is as follows: all values are WNL except for Neutro Abs at 1.5K, Total Protein at 6.4  On review of systems, pt reports healthy appetite, sleeping well and denies fevers, chills, night sweats, bleeding issues, fatigue, abdominal pain and any other symptoms.   MEDICAL  HISTORY:  Past Medical History:  Diagnosis Date  . Anxiety   . 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 Glaucoma   . Hyperlipemia   . Hypertension   . Osteoporosis     SURGICAL HISTORY: No past surgical history on file.  SOCIAL HISTORY: Social History   Socioeconomic History  . Marital status: Married    Spouse name: Not on file  . Number of children: 1  . Years of education: HS  . Highest education level: Not on file  Occupational History  . Occupation: Retired  Tobacco Use  . Smoking status: Never Smoker  . Smokeless tobacco: Never Used  Substance and Sexual Activity  . Alcohol use: No    Alcohol/week: 0.0 standard drinks  . Drug use: No  . Sexual activity: Not on file  Other Topics Concern  . Not on file  Social History Narrative   Drinks 2 cups of coffee   Social Determinants of Health   Financial Resource Strain:   . Difficulty of Paying Living Expenses:   Food Insecurity:   . Worried About Charity fundraiser in the Last Year:   . Arboriculturist in the Last Year:   Transportation Needs:   . Film/video editor (Medical):   Marland Kitchen Lack of Transportation (Non-Medical):   Physical Activity:   . Days of Exercise per Week:   . Minutes of Exercise per Session:  Stress:   . Feeling of Stress :   Social Connections:   . Frequency of Communication with Friends and Family:   . Frequency of Social Gatherings with Friends and Family:   . Attends Religious Services:   . Active Member of Clubs or Organizations:   . Attends Archivist Meetings:   Marland Kitchen Marital Status:   Intimate Partner Violence:   . Fear of Current or Ex-Partner:   . Emotionally Abused:   Marland Kitchen Physically Abused:   . Sexually Abused:     FAMILY HISTORY: Family History  Problem Relation Age of Onset  . Stroke Mother   . Lung cancer Father   . Leukemia Neg Hx     ALLERGIES:  is allergic to codeine.  MEDICATIONS:  Current Outpatient Medications  Medication Sig Dispense  Refill  . amLODipine (NORVASC) 10 MG tablet Take 1 tablet (10 mg total) by mouth at bedtime. 30 tablet 0  . atorvastatin (LIPITOR) 40 MG tablet Take 40 mg by mouth daily.     . Brinzolamide-Brimonidine (SIMBRINZA) 1-0.2 % SUSP Place 1 drop into both eyes daily at 12 noon.    . Calcium Carbonate-Vitamin D (CALCIUM-D PO) Take 1 tablet by mouth daily.    . Cholecalciferol (VITAMIN D3) 2000 units TABS Take 2,000 Units by mouth daily.     . dorzolamide-timolol (COSOPT) 22.3-6.8 MG/ML ophthalmic solution Place 1 drop into both eyes daily.     . furosemide (LASIX) 20 MG tablet Take 40 mg for 3 days then resume your 20 mg daily (Patient taking differently: Take 20 mg by mouth daily. ) 30 tablet   . latanoprost (XALATAN) 0.005 % ophthalmic solution Place 1 drop into both eyes at bedtime.     . Multiple Vitamin (MULTIVITAMIN WITH MINERALS) TABS tablet Take 1 tablet by mouth daily.    . quinapril (ACCUPRIL) 40 MG tablet Take 40 mg by mouth daily.     . VENCLEXTA 100 MG TABS TAKE 2 TABLETS (200 MG) BY MOUTH DAILY. 60 tablet 2   No current facility-administered medications for this visit.   Facility-Administered Medications Ordered in Other Visits  Medication Dose Route Frequency Provider Last Rate Last Admin  . romiPLOStim (NPLATE) injection 95 mcg  2 mcg/kg Subcutaneous Weekly [name], MD   95 mcg at 04/18/20 1206    REVIEW OF SYSTEMS:   A 10+ POINT REVIEW OF SYSTEMS WAS OBTAINED including neurology, dermatology, psychiatry, cardiac, respiratory, lymph, extremities, GI, GU, Musculoskeletal, constitutional, breasts, reproductive, HEENT.  All pertinent positives are noted in the HPI.  All others are negative.   PHYSICAL EXAMINATION:  ECOG FS:2 - Symptomatic, <50% confined to bed  Vitals:   04/18/20 1000  BP: (!) 145/62  Pulse: (!) 59  Resp: 20  Temp: 97.9 F (36.6 C)  SpO2: 100%   Wt Readings from Last 3 Encounters:  04/18/20 103 lb 4.8 oz (46.9 kg)  02/22/20 101 lb 3.2 oz (45.9  kg)  01/25/20 102 lb (46.3 kg)   Body mass index is 21.59 kg/m.    Exam given in chair   GENERAL:alert, in no acute distress and comfortable SKIN: no acute rashes, no significant lesions EYES: conjunctiva are pink and non-injected, sclera anicteric OROPHARYNX: MMM, no exudates, no oropharyngeal erythema or ulceration NECK: supple, no JVD LYMPH:  no palpable lymphadenopathy in the cervical, axillary or inguinal regions LUNGS: clear to auscultation b/l with normal respiratory effort HEART: regular rate & rhythm ABDOMEN:  normoactive bowel sounds , non tender, not distended.  Extremity: no pedal edema PSYCH: alert & oriented x 3 with fluent speech NEURO: no focal motor/sensory deficits  LABORATORY DATA:  I have reviewed the data as listed  . CBC Latest Ref Rng & Units 04/18/2020 04/11/2020 04/04/2020  WBC 4.0 - 10.5 K/uL 4.7 5.0 4.6  Hemoglobin 12.0 - 15.0 g/dL 14.2 14.9 14.3  Hematocrit 36 - 46 % 42.2 43.1 42.2  Platelets 150 - 400 K/uL 153 157 131(L)    . CMP Latest Ref Rng & Units 04/18/2020 03/21/2020 02/22/2020  Glucose 70 - 99 mg/dL 92 93 92  BUN 8 - 23 mg/dL 22 23 18   Creatinine 0.44 - 1.00 mg/dL 0.83 0.82 0.83  Sodium 135 - 145 mmol/L 144 143 149(H)  Potassium 3.5 - 5.1 mmol/L 4.8 4.8 4.3  Chloride 98 - 111 mmol/L 107 108 109  CO2 22 - 32 mmol/L 29 27 28   Calcium 8.9 - 10.3 mg/dL 10.2 9.9 9.9  Total Protein 6.5 - 8.1 g/dL 6.4(L) 6.5 6.3(L)  Total Bilirubin 0.3 - 1.2 mg/dL 0.7 0.7 0.7  Alkaline Phos 38 - 126 U/L 104 99 89  AST 15 - 41 U/L 22 22 21   ALT 0 - 44 U/L 12 13 14    02/27/2019 FISH:    RADIOGRAPHIC STUDIES: I have personally reviewed the radiological images as listed and agreed with the findings in the report. No results found.  ASSESSMENT & PLAN:   #1 CLL Previous CLL prognostic FISH panel without detectable mutations. Has been Rai stage 0.. Now with new anemia and severe thrombocytopenia. WBC counts have increased significantly to 84k from 39k 3 months  ago.  Concerning for change in tempo of her disease. -08/31/2019 CT C/A/P ([id number]) ([id number]) revealed "1. Mild multistation lymphadenopathy in the retroperitoneum and bilateral pelvis as detailed, compatible with lymphoma. Normal size spleen. 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  However cannot rule out bone marrow involvement as an etiology of thrombocytopenia as well.  #3 new mild normocytic anemia.  Hemoglobin 11.7 with an MCV of 97.4. --- now resolved hgb today 14.8 Likely related to her CLL. 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  #4 h/o intraparenchymal bleed- rpt CT head stable (due to uncontrolled HTN and thrombocytopenia)  PLAN: -Discussed pt labwork today, 04/18/20; of CBC w/diff and CMP is as follows: all values are WNL except for Neutro Abs at 1.5K, Total Protein at 6.4 -Advised on N-plate shot to help platelets -will reduce from 28mcg/kg to 43mcg/kg per week  -Advised on trying to get off N-plate shot over the next several weeks -Advised continue N-Plate or switch to Promacta if longer process -Pt CLL seems to be in remission based on normalized lymphocyte counts. -Pt blood pressure has been controlled  -Goal Platelets >50K -Continue 200 mg of Venetoclax -currently tolerating well -No bleeding issues. No headache or new FND. 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  -Pt prefers continuing NPlate shot  -Recommends staying hydrated  -Will see back in 4 weeks  FOLLOW UP: Plz continue weekly labs and Nplate x 8 weeks MD visit in 4 weeks  The total time spent in the appt was 20 minutes and more than 50% was on counseling and direct patient cares.  All of the patient's  questions were answered with apparent satisfaction. The patient knows to call the clinic with any problems, questions or concerns.' Sullivan Lone MD MS AAHIVMS [facility] Hematology/Oncology Physician  [facility]  (Office):       [phone] (Work cell):  [phone] (Fax):           [phone]  04/18/2020 1:13 PM  I, Dawayne Cirri am acting as a Education administrator for Dr. Sullivan Lone.   .I have reviewed the above documentation for accuracy and completeness, and I agree with the above. [name] MD

## 2020-04-17 DIAGNOSIS — E559 Vitamin D deficiency, unspecified: Secondary | ICD-10-CM | POA: Diagnosis not present

## 2020-04-17 DIAGNOSIS — D693 Immune thrombocytopenic purpura: Secondary | ICD-10-CM | POA: Diagnosis not present

## 2020-04-17 DIAGNOSIS — Z0000 Encounter for general adult medical examination without abnormal findings: Secondary | ICD-10-CM | POA: Diagnosis not present

## 2020-04-17 DIAGNOSIS — I10 Essential (primary) hypertension: Secondary | ICD-10-CM | POA: Diagnosis not present

## 2020-04-17 DIAGNOSIS — C9110 Chronic lymphocytic leukemia of B-cell type not having achieved remission: Secondary | ICD-10-CM | POA: Diagnosis not present

## 2020-04-17 DIAGNOSIS — F4320 Adjustment disorder, unspecified: Secondary | ICD-10-CM | POA: Diagnosis not present

## 2020-04-17 DIAGNOSIS — E782 Mixed hyperlipidemia: Secondary | ICD-10-CM | POA: Diagnosis not present

## 2020-04-17 DIAGNOSIS — M810 Age-related osteoporosis without current pathological fracture: Secondary | ICD-10-CM | POA: Diagnosis not present

## 2020-04-17 DIAGNOSIS — H409 Unspecified glaucoma: Secondary | ICD-10-CM | POA: Diagnosis not present

## 2020-04-18 ENCOUNTER — Inpatient Hospital Stay: Payer: PPO | Attending: Hematology

## 2020-04-18 ENCOUNTER — Other Ambulatory Visit: Payer: Self-pay

## 2020-04-18 ENCOUNTER — Inpatient Hospital Stay: Payer: PPO

## 2020-04-18 ENCOUNTER — Inpatient Hospital Stay (HOSPITAL_BASED_OUTPATIENT_CLINIC_OR_DEPARTMENT_OTHER): Payer: PPO | Admitting: Hematology

## 2020-04-18 VITALS — BP 145/62 | HR 59 | Temp 97.9°F | Resp 20 | Ht <= 58 in | Wt 103.3 lb

## 2020-04-18 DIAGNOSIS — D693 Immune thrombocytopenic purpura: Secondary | ICD-10-CM

## 2020-04-18 DIAGNOSIS — D649 Anemia, unspecified: Secondary | ICD-10-CM | POA: Diagnosis not present

## 2020-04-18 DIAGNOSIS — Z801 Family history of malignant neoplasm of trachea, bronchus and lung: Secondary | ICD-10-CM | POA: Insufficient documentation

## 2020-04-18 DIAGNOSIS — C9110 Chronic lymphocytic leukemia of B-cell type not having achieved remission: Secondary | ICD-10-CM

## 2020-04-18 DIAGNOSIS — I629 Nontraumatic intracranial hemorrhage, unspecified: Secondary | ICD-10-CM

## 2020-04-18 DIAGNOSIS — Z79899 Other long term (current) drug therapy: Secondary | ICD-10-CM | POA: Insufficient documentation

## 2020-04-18 DIAGNOSIS — I10 Essential (primary) hypertension: Secondary | ICD-10-CM | POA: Diagnosis not present

## 2020-04-18 LAB — CBC WITH DIFFERENTIAL/PLATELET
Abs Immature Granulocytes: 0.01 10*3/uL (ref 0.00–0.07)
Basophils Absolute: 0 10*3/uL (ref 0.0–0.1)
Basophils Relative: 0 %
Eosinophils Absolute: 0 10*3/uL (ref 0.0–0.5)
Eosinophils Relative: 0 %
HCT: 42.2 % (ref 36.0–46.0)
Hemoglobin: 14.2 g/dL (ref 12.0–15.0)
Immature Granulocytes: 0 %
Lymphocytes Relative: 54 %
Lymphs Abs: 2.5 10*3/uL (ref 0.7–4.0)
MCH: 30.3 pg (ref 26.0–34.0)
MCHC: 33.6 g/dL (ref 30.0–36.0)
MCV: 90.2 fL (ref 80.0–100.0)
Monocytes Absolute: 0.7 10*3/uL (ref 0.1–1.0)
Monocytes Relative: 14 %
Neutro Abs: 1.5 10*3/uL — ABNORMAL LOW (ref 1.7–7.7)
Neutrophils Relative %: 32 %
Platelets: 153 10*3/uL (ref 150–400)
RBC: 4.68 MIL/uL (ref 3.87–5.11)
RDW: 13 % (ref 11.5–15.5)
WBC: 4.7 10*3/uL (ref 4.0–10.5)
nRBC: 0 % (ref 0.0–0.2)

## 2020-04-18 LAB — CMP (CANCER CENTER ONLY)
ALT: 12 U/L (ref 0–44)
AST: 22 U/L (ref 15–41)
Albumin: 4.1 g/dL (ref 3.5–5.0)
Alkaline Phosphatase: 104 U/L (ref 38–126)
Anion gap: 8 (ref 5–15)
BUN: 22 mg/dL (ref 8–23)
CO2: 29 mmol/L (ref 22–32)
Calcium: 10.2 mg/dL (ref 8.9–10.3)
Chloride: 107 mmol/L (ref 98–111)
Creatinine: 0.83 mg/dL (ref 0.44–1.00)
GFR, Est AFR Am: >60 mL/min (ref >60)
GFR, Estimated: >60 mL/min (ref >60)
Glucose, Bld: 92 mg/dL (ref 70–99)
Potassium: 4.8 mmol/L (ref 3.5–5.1)
Sodium: 144 mmol/L (ref 135–145)
Total Bilirubin: 0.7 mg/dL (ref 0.3–1.2)
Total Protein: 6.4 g/dL — ABNORMAL LOW (ref 6.5–8.1)

## 2020-04-18 MED ORDER — ROMIPLOSTIM 125 MCG ~~LOC~~ SOLR
2.0000 ug/kg | SUBCUTANEOUS | Status: DC
  Administered 2020-04-18: 95 ug via SUBCUTANEOUS
  Filled 2020-04-18: qty 0.19

## 2020-04-18 NOTE — Patient Instructions (Signed)
Romiplostim injection What is this medicine? ROMIPLOSTIM (roe mi PLOE stim) helps your body make more platelets. This medicine is used to treat low platelets caused by chronic idiopathic thrombocytopenic purpura (ITP). This medicine may be used for other purposes; ask your health care provider or pharmacist if you have questions. COMMON BRAND NAME(S): Nplate What should I tell my health care provider before I take this medicine? They need to know if you have any of these conditions:  bleeding disorders  bone marrow problem, like blood cancer or myelodysplastic syndrome  history of blood clots  liver disease  surgery to remove your spleen  an unusual or allergic reaction to romiplostim, mannitol, other medicines, foods, dyes, or preservatives  pregnant or trying to get pregnant  breast-feeding How should I use this medicine? This medicine is for injection under the skin. It is given by a health care professional in a hospital or clinic setting. A special MedGuide will be given to you before your injection. Read this information carefully each time. Talk to your pediatrician regarding the use of this medicine in children. While this drug may be prescribed for children as young as 1 year for selected conditions, precautions do apply. Overdosage: If you think you have taken too much of this medicine contact a poison control center or emergency room at once. NOTE: This medicine is only for you. Do not share this medicine with others. What if I miss a dose? It is important not to miss your dose. Call your doctor or health care professional if you are unable to keep an appointment. What may interact with this medicine? Interactions are not expected. This list may not describe all possible interactions. Give your health care provider a list of all the medicines, herbs, non-prescription drugs, or dietary supplements you use. Also tell them if you smoke, drink alcohol, or use illegal drugs.  Some items may interact with your medicine. What should I watch for while using this medicine? Your condition will be monitored carefully while you are receiving this medicine. Visit your prescriber or health care professional for regular checks on your progress and for the needed blood tests. It is important to keep all appointments. What side effects may I notice from receiving this medicine? Side effects that you should report to your doctor or health care professional as soon as possible:  allergic reactions like skin rash, itching or hives, swelling of the face, lips, or tongue  signs and symptoms of bleeding such as bloody or black, tarry stools; red or dark brown urine; spitting up blood or brown material that looks like coffee grounds; red spots on the skin; unusual bruising or bleeding from the eyes, gums, or nose  signs and symptoms of a blood clot such as chest pain; shortness of breath; pain, swelling, or warmth in the leg  signs and symptoms of a stroke like changes in vision; confusion; trouble speaking or understanding; severe headaches; sudden numbness or weakness of the face, arm or leg; trouble walking; dizziness; loss of balance or coordination Side effects that usually do not require medical attention (report to your doctor or health care professional if they continue or are bothersome):  headache  pain in arms and legs  pain in mouth  stomach pain This list may not describe all possible side effects. Call your doctor for medical advice about side effects. You may report side effects to FDA at 1-800-FDA-1088. Where should I keep my medicine? This drug is given in a hospital or clinic   and will not be stored at home. NOTE: This sheet is a summary. It may not cover all possible information. If you have questions about this medicine, talk to your doctor, pharmacist, or health care provider.  2020 Elsevier/Gold Standard (2017-08-29 11:10:55)  

## 2020-04-24 ENCOUNTER — Telehealth: Payer: Self-pay | Admitting: Hematology

## 2020-04-24 NOTE — Telephone Encounter (Signed)
Scheduled per 08/06 los, patient has been called and voicemail was left.

## 2020-04-25 ENCOUNTER — Inpatient Hospital Stay: Payer: PPO

## 2020-04-25 ENCOUNTER — Other Ambulatory Visit: Payer: Self-pay

## 2020-04-25 VITALS — BP 171/63 | HR 62 | Temp 98.1°F | Resp 18

## 2020-04-25 DIAGNOSIS — D693 Immune thrombocytopenic purpura: Secondary | ICD-10-CM | POA: Diagnosis not present

## 2020-04-25 DIAGNOSIS — C9110 Chronic lymphocytic leukemia of B-cell type not having achieved remission: Secondary | ICD-10-CM

## 2020-04-25 DIAGNOSIS — I629 Nontraumatic intracranial hemorrhage, unspecified: Secondary | ICD-10-CM

## 2020-04-25 LAB — CBC WITH DIFFERENTIAL/PLATELET
Abs Immature Granulocytes: 0.01 10*3/uL (ref 0.00–0.07)
Basophils Absolute: 0 10*3/uL (ref 0.0–0.1)
Basophils Relative: 0 %
Eosinophils Absolute: 0 10*3/uL (ref 0.0–0.5)
Eosinophils Relative: 0 %
HCT: 42 % (ref 36.0–46.0)
Hemoglobin: 14.4 g/dL (ref 12.0–15.0)
Immature Granulocytes: 0 %
Lymphocytes Relative: 50 %
Lymphs Abs: 2.4 10*3/uL (ref 0.7–4.0)
MCH: 31 pg (ref 26.0–34.0)
MCHC: 34.3 g/dL (ref 30.0–36.0)
MCV: 90.3 fL (ref 80.0–100.0)
Monocytes Absolute: 0.7 10*3/uL (ref 0.1–1.0)
Monocytes Relative: 14 %
Neutro Abs: 1.8 10*3/uL (ref 1.7–7.7)
Neutrophils Relative %: 36 %
Platelets: 134 10*3/uL — ABNORMAL LOW (ref 150–400)
RBC: 4.65 MIL/uL (ref 3.87–5.11)
RDW: 12.9 % (ref 11.5–15.5)
WBC: 4.9 10*3/uL (ref 4.0–10.5)
nRBC: 0 % (ref 0.0–0.2)

## 2020-04-25 MED ORDER — ROMIPLOSTIM 125 MCG ~~LOC~~ SOLR
2.0000 ug/kg | SUBCUTANEOUS | Status: DC
  Administered 2020-04-25: 95 ug via SUBCUTANEOUS
  Filled 2020-04-25: qty 0.19

## 2020-04-25 MED FILL — VENCLEXTA 100 MG TABS: 100 | 30 days supply | Qty: 60 | Fill #2

## 2020-05-02 ENCOUNTER — Inpatient Hospital Stay: Payer: PPO

## 2020-05-02 ENCOUNTER — Other Ambulatory Visit: Payer: Self-pay

## 2020-05-02 VITALS — BP 168/66 | HR 63 | Temp 98.2°F | Resp 18

## 2020-05-02 DIAGNOSIS — I629 Nontraumatic intracranial hemorrhage, unspecified: Secondary | ICD-10-CM

## 2020-05-02 DIAGNOSIS — D693 Immune thrombocytopenic purpura: Secondary | ICD-10-CM | POA: Diagnosis not present

## 2020-05-02 DIAGNOSIS — C9110 Chronic lymphocytic leukemia of B-cell type not having achieved remission: Secondary | ICD-10-CM

## 2020-05-02 LAB — CBC WITH DIFFERENTIAL/PLATELET
Abs Immature Granulocytes: 0.01 10*3/uL (ref 0.00–0.07)
Basophils Absolute: 0 10*3/uL (ref 0.0–0.1)
Basophils Relative: 0 %
Eosinophils Absolute: 0 10*3/uL (ref 0.0–0.5)
Eosinophils Relative: 0 %
HCT: 41.8 % (ref 36.0–46.0)
Hemoglobin: 14.1 g/dL (ref 12.0–15.0)
Immature Granulocytes: 0 %
Lymphocytes Relative: 45 %
Lymphs Abs: 2.4 10*3/uL (ref 0.7–4.0)
MCH: 30.3 pg (ref 26.0–34.0)
MCHC: 33.7 g/dL (ref 30.0–36.0)
MCV: 89.9 fL (ref 80.0–100.0)
Monocytes Absolute: 0.8 10*3/uL (ref 0.1–1.0)
Monocytes Relative: 14 %
Neutro Abs: 2.2 10*3/uL (ref 1.7–7.7)
Neutrophils Relative %: 41 %
Platelets: 139 10*3/uL — ABNORMAL LOW (ref 150–400)
RBC: 4.65 MIL/uL (ref 3.87–5.11)
RDW: 12.9 % (ref 11.5–15.5)
WBC: 5.5 10*3/uL (ref 4.0–10.5)
nRBC: 0 % (ref 0.0–0.2)

## 2020-05-02 MED ORDER — ROMIPLOSTIM 125 MCG ~~LOC~~ SOLR
95.0000 ug | SUBCUTANEOUS | Status: DC
  Administered 2020-05-02: 95 ug via SUBCUTANEOUS
  Filled 2020-05-02: qty 0.19

## 2020-05-02 NOTE — Patient Instructions (Signed)
Romiplostim injection What is this medicine? ROMIPLOSTIM (roe mi PLOE stim) helps your body make more platelets. This medicine is used to treat low platelets caused by chronic idiopathic thrombocytopenic purpura (ITP). This medicine may be used for other purposes; ask your health care provider or pharmacist if you have questions. COMMON BRAND NAME(S): Nplate What should I tell my health care provider before I take this medicine? They need to know if you have any of these conditions:  bleeding disorders  bone marrow problem, like blood cancer or myelodysplastic syndrome  history of blood clots  liver disease  surgery to remove your spleen  an unusual or allergic reaction to romiplostim, mannitol, other medicines, foods, dyes, or preservatives  pregnant or trying to get pregnant  breast-feeding How should I use this medicine? This medicine is for injection under the skin. It is given by a health care professional in a hospital or clinic setting. A special MedGuide will be given to you before your injection. Read this information carefully each time. Talk to your pediatrician regarding the use of this medicine in children. While this drug may be prescribed for children as young as 1 year for selected conditions, precautions do apply. Overdosage: If you think you have taken too much of this medicine contact a poison control center or emergency room at once. NOTE: This medicine is only for you. Do not share this medicine with others. What if I miss a dose? It is important not to miss your dose. Call your doctor or health care professional if you are unable to keep an appointment. What may interact with this medicine? Interactions are not expected. This list may not describe all possible interactions. Give your health care provider a list of all the medicines, herbs, non-prescription drugs, or dietary supplements you use. Also tell them if you smoke, drink alcohol, or use illegal drugs.  Some items may interact with your medicine. What should I watch for while using this medicine? Your condition will be monitored carefully while you are receiving this medicine. Visit your prescriber or health care professional for regular checks on your progress and for the needed blood tests. It is important to keep all appointments. What side effects may I notice from receiving this medicine? Side effects that you should report to your doctor or health care professional as soon as possible:  allergic reactions like skin rash, itching or hives, swelling of the face, lips, or tongue  signs and symptoms of bleeding such as bloody or black, tarry stools; red or dark brown urine; spitting up blood or brown material that looks like coffee grounds; red spots on the skin; unusual bruising or bleeding from the eyes, gums, or nose  signs and symptoms of a blood clot such as chest pain; shortness of breath; pain, swelling, or warmth in the leg  signs and symptoms of a stroke like changes in vision; confusion; trouble speaking or understanding; severe headaches; sudden numbness or weakness of the face, arm or leg; trouble walking; dizziness; loss of balance or coordination Side effects that usually do not require medical attention (report to your doctor or health care professional if they continue or are bothersome):  headache  pain in arms and legs  pain in mouth  stomach pain This list may not describe all possible side effects. Call your doctor for medical advice about side effects. You may report side effects to FDA at 1-800-FDA-1088. Where should I keep my medicine? This drug is given in a hospital or clinic   and will not be stored at home. NOTE: This sheet is a summary. It may not cover all possible information. If you have questions about this medicine, talk to your doctor, pharmacist, or health care provider.  2020 Elsevier/Gold Standard (2017-08-29 11:10:55)  

## 2020-05-09 ENCOUNTER — Inpatient Hospital Stay: Payer: PPO

## 2020-05-09 ENCOUNTER — Other Ambulatory Visit: Payer: Self-pay

## 2020-05-09 VITALS — BP 160/64 | HR 66 | Temp 99.0°F | Resp 18

## 2020-05-09 DIAGNOSIS — D693 Immune thrombocytopenic purpura: Secondary | ICD-10-CM

## 2020-05-09 DIAGNOSIS — I629 Nontraumatic intracranial hemorrhage, unspecified: Secondary | ICD-10-CM

## 2020-05-09 DIAGNOSIS — C9110 Chronic lymphocytic leukemia of B-cell type not having achieved remission: Secondary | ICD-10-CM

## 2020-05-09 LAB — CBC WITH DIFFERENTIAL/PLATELET
Abs Immature Granulocytes: 0.01 10*3/uL (ref 0.00–0.07)
Basophils Absolute: 0 10*3/uL (ref 0.0–0.1)
Basophils Relative: 0 %
Eosinophils Absolute: 0.1 10*3/uL (ref 0.0–0.5)
Eosinophils Relative: 1 %
HCT: 43.1 % (ref 36.0–46.0)
Hemoglobin: 14.9 g/dL (ref 12.0–15.0)
Immature Granulocytes: 0 %
Lymphocytes Relative: 45 %
Lymphs Abs: 2.5 10*3/uL (ref 0.7–4.0)
MCH: 30.8 pg (ref 26.0–34.0)
MCHC: 34.6 g/dL (ref 30.0–36.0)
MCV: 89.2 fL (ref 80.0–100.0)
Monocytes Absolute: 0.7 10*3/uL (ref 0.1–1.0)
Monocytes Relative: 13 %
Neutro Abs: 2.4 10*3/uL (ref 1.7–7.7)
Neutrophils Relative %: 41 %
Platelets: 128 10*3/uL — ABNORMAL LOW (ref 150–400)
RBC: 4.83 MIL/uL (ref 3.87–5.11)
RDW: 12.9 % (ref 11.5–15.5)
WBC: 5.7 10*3/uL (ref 4.0–10.5)
nRBC: 0 % (ref 0.0–0.2)

## 2020-05-09 MED ORDER — ROMIPLOSTIM 125 MCG ~~LOC~~ SOLR
95.0000 ug | SUBCUTANEOUS | Status: DC
  Administered 2020-05-09: 95 ug via SUBCUTANEOUS
  Filled 2020-05-09: qty 0.19

## 2020-05-09 NOTE — Patient Instructions (Signed)
Romiplostim injection What is this medicine? ROMIPLOSTIM (roe mi PLOE stim) helps your body make more platelets. This medicine is used to treat low platelets caused by chronic idiopathic thrombocytopenic purpura (ITP). This medicine may be used for other purposes; ask your health care provider or pharmacist if you have questions. COMMON BRAND NAME(S): Nplate What should I tell my health care provider before I take this medicine? They need to know if you have any of these conditions:  bleeding disorders  bone marrow problem, like blood cancer or myelodysplastic syndrome  history of blood clots  liver disease  surgery to remove your spleen  an unusual or allergic reaction to romiplostim, mannitol, other medicines, foods, dyes, or preservatives  pregnant or trying to get pregnant  breast-feeding How should I use this medicine? This medicine is for injection under the skin. It is given by a health care professional in a hospital or clinic setting. A special MedGuide will be given to you before your injection. Read this information carefully each time. Talk to your pediatrician regarding the use of this medicine in children. While this drug may be prescribed for children as young as 1 year for selected conditions, precautions do apply. Overdosage: If you think you have taken too much of this medicine contact a poison control center or emergency room at once. NOTE: This medicine is only for you. Do not share this medicine with others. What if I miss a dose? It is important not to miss your dose. Call your doctor or health care professional if you are unable to keep an appointment. What may interact with this medicine? Interactions are not expected. This list may not describe all possible interactions. Give your health care provider a list of all the medicines, herbs, non-prescription drugs, or dietary supplements you use. Also tell them if you smoke, drink alcohol, or use illegal drugs.  Some items may interact with your medicine. What should I watch for while using this medicine? Your condition will be monitored carefully while you are receiving this medicine. Visit your prescriber or health care professional for regular checks on your progress and for the needed blood tests. It is important to keep all appointments. What side effects may I notice from receiving this medicine? Side effects that you should report to your doctor or health care professional as soon as possible:  allergic reactions like skin rash, itching or hives, swelling of the face, lips, or tongue  signs and symptoms of bleeding such as bloody or black, tarry stools; red or dark brown urine; spitting up blood or brown material that looks like coffee grounds; red spots on the skin; unusual bruising or bleeding from the eyes, gums, or nose  signs and symptoms of a blood clot such as chest pain; shortness of breath; pain, swelling, or warmth in the leg  signs and symptoms of a stroke like changes in vision; confusion; trouble speaking or understanding; severe headaches; sudden numbness or weakness of the face, arm or leg; trouble walking; dizziness; loss of balance or coordination Side effects that usually do not require medical attention (report to your doctor or health care professional if they continue or are bothersome):  headache  pain in arms and legs  pain in mouth  stomach pain This list may not describe all possible side effects. Call your doctor for medical advice about side effects. You may report side effects to FDA at 1-800-FDA-1088. Where should I keep my medicine? This drug is given in a hospital or clinic   and will not be stored at home. NOTE: This sheet is a summary. It may not cover all possible information. If you have questions about this medicine, talk to your doctor, pharmacist, or health care provider.  2020 Elsevier/Gold Standard (2017-08-29 11:10:55)  

## 2020-05-14 DIAGNOSIS — M810 Age-related osteoporosis without current pathological fracture: Secondary | ICD-10-CM | POA: Diagnosis not present

## 2020-05-14 DIAGNOSIS — H409 Unspecified glaucoma: Secondary | ICD-10-CM | POA: Diagnosis not present

## 2020-05-14 DIAGNOSIS — E782 Mixed hyperlipidemia: Secondary | ICD-10-CM | POA: Diagnosis not present

## 2020-05-14 DIAGNOSIS — I10 Essential (primary) hypertension: Secondary | ICD-10-CM | POA: Diagnosis not present

## 2020-05-14 DIAGNOSIS — I618 Other nontraumatic intracerebral hemorrhage: Secondary | ICD-10-CM | POA: Diagnosis not present

## 2020-05-16 ENCOUNTER — Inpatient Hospital Stay: Payer: PPO

## 2020-05-16 ENCOUNTER — Inpatient Hospital Stay: Payer: PPO | Attending: Hematology

## 2020-05-16 ENCOUNTER — Other Ambulatory Visit: Payer: Self-pay

## 2020-05-16 VITALS — BP 150/59 | HR 63 | Temp 97.8°F | Resp 17

## 2020-05-16 DIAGNOSIS — Z79899 Other long term (current) drug therapy: Secondary | ICD-10-CM | POA: Diagnosis not present

## 2020-05-16 DIAGNOSIS — C9110 Chronic lymphocytic leukemia of B-cell type not having achieved remission: Secondary | ICD-10-CM | POA: Diagnosis not present

## 2020-05-16 DIAGNOSIS — D693 Immune thrombocytopenic purpura: Secondary | ICD-10-CM

## 2020-05-16 DIAGNOSIS — Z801 Family history of malignant neoplasm of trachea, bronchus and lung: Secondary | ICD-10-CM | POA: Diagnosis not present

## 2020-05-16 DIAGNOSIS — I629 Nontraumatic intracranial hemorrhage, unspecified: Secondary | ICD-10-CM

## 2020-05-16 DIAGNOSIS — D649 Anemia, unspecified: Secondary | ICD-10-CM | POA: Diagnosis not present

## 2020-05-16 DIAGNOSIS — I10 Essential (primary) hypertension: Secondary | ICD-10-CM | POA: Insufficient documentation

## 2020-05-16 LAB — CBC WITH DIFFERENTIAL/PLATELET
Abs Immature Granulocytes: 0.01 10*3/uL (ref 0.00–0.07)
Basophils Absolute: 0 10*3/uL (ref 0.0–0.1)
Basophils Relative: 1 %
Eosinophils Absolute: 0 10*3/uL (ref 0.0–0.5)
Eosinophils Relative: 0 %
HCT: 42.3 % (ref 36.0–46.0)
Hemoglobin: 14.2 g/dL (ref 12.0–15.0)
Immature Granulocytes: 0 %
Lymphocytes Relative: 41 %
Lymphs Abs: 2.5 10*3/uL (ref 0.7–4.0)
MCH: 30.1 pg (ref 26.0–34.0)
MCHC: 33.6 g/dL (ref 30.0–36.0)
MCV: 89.6 fL (ref 80.0–100.0)
Monocytes Absolute: 1.2 10*3/uL — ABNORMAL HIGH (ref 0.1–1.0)
Monocytes Relative: 20 %
Neutro Abs: 2.3 10*3/uL (ref 1.7–7.7)
Neutrophils Relative %: 38 %
Platelets: 103 10*3/uL — ABNORMAL LOW (ref 150–400)
RBC: 4.72 MIL/uL (ref 3.87–5.11)
RDW: 13.2 % (ref 11.5–15.5)
WBC: 6 10*3/uL (ref 4.0–10.5)
nRBC: 0 % (ref 0.0–0.2)

## 2020-05-16 LAB — CMP (CANCER CENTER ONLY)
ALT: 9 U/L (ref 0–44)
AST: 19 U/L (ref 15–41)
Albumin: 3.6 g/dL (ref 3.5–5.0)
Alkaline Phosphatase: 93 U/L (ref 38–126)
Anion gap: 9 (ref 5–15)
BUN: 17 mg/dL (ref 8–23)
CO2: 25 mmol/L (ref 22–32)
Calcium: 9.5 mg/dL (ref 8.9–10.3)
Chloride: 109 mmol/L (ref 98–111)
Creatinine: 0.79 mg/dL (ref 0.44–1.00)
GFR, Est AFR Am: >60 mL/min (ref >60)
GFR, Estimated: >60 mL/min (ref >60)
Glucose, Bld: 101 mg/dL — ABNORMAL HIGH (ref 70–99)
Potassium: 4.9 mmol/L (ref 3.5–5.1)
Sodium: 143 mmol/L (ref 135–145)
Total Bilirubin: 0.5 mg/dL (ref 0.3–1.2)
Total Protein: 6.2 g/dL — ABNORMAL LOW (ref 6.5–8.1)

## 2020-05-16 MED ORDER — ROMIPLOSTIM 125 MCG ~~LOC~~ SOLR
95.0000 ug | SUBCUTANEOUS | Status: DC
  Administered 2020-05-16: 95 ug via SUBCUTANEOUS
  Filled 2020-05-16: qty 0.19

## 2020-05-16 NOTE — Patient Instructions (Signed)
Romiplostim injection What is this medicine? ROMIPLOSTIM (roe mi PLOE stim) helps your body make more platelets. This medicine is used to treat low platelets caused by chronic idiopathic thrombocytopenic purpura (ITP). This medicine may be used for other purposes; ask your health care provider or pharmacist if you have questions. COMMON BRAND NAME(S): Nplate What should I tell my health care provider before I take this medicine? They need to know if you have any of these conditions:  bleeding disorders  bone marrow problem, like blood cancer or myelodysplastic syndrome  history of blood clots  liver disease  surgery to remove your spleen  an unusual or allergic reaction to romiplostim, mannitol, other medicines, foods, dyes, or preservatives  pregnant or trying to get pregnant  breast-feeding How should I use this medicine? This medicine is for injection under the skin. It is given by a health care professional in a hospital or clinic setting. A special MedGuide will be given to you before your injection. Read this information carefully each time. Talk to your pediatrician regarding the use of this medicine in children. While this drug may be prescribed for children as young as 1 year for selected conditions, precautions do apply. Overdosage: If you think you have taken too much of this medicine contact a poison control center or emergency room at once. NOTE: This medicine is only for you. Do not share this medicine with others. What if I miss a dose? It is important not to miss your dose. Call your doctor or health care professional if you are unable to keep an appointment. What may interact with this medicine? Interactions are not expected. This list may not describe all possible interactions. Give your health care provider a list of all the medicines, herbs, non-prescription drugs, or dietary supplements you use. Also tell them if you smoke, drink alcohol, or use illegal drugs.  Some items may interact with your medicine. What should I watch for while using this medicine? Your condition will be monitored carefully while you are receiving this medicine. Visit your prescriber or health care professional for regular checks on your progress and for the needed blood tests. It is important to keep all appointments. What side effects may I notice from receiving this medicine? Side effects that you should report to your doctor or health care professional as soon as possible:  allergic reactions like skin rash, itching or hives, swelling of the face, lips, or tongue  signs and symptoms of bleeding such as bloody or black, tarry stools; red or dark brown urine; spitting up blood or brown material that looks like coffee grounds; red spots on the skin; unusual bruising or bleeding from the eyes, gums, or nose  signs and symptoms of a blood clot such as chest pain; shortness of breath; pain, swelling, or warmth in the leg  signs and symptoms of a stroke like changes in vision; confusion; trouble speaking or understanding; severe headaches; sudden numbness or weakness of the face, arm or leg; trouble walking; dizziness; loss of balance or coordination Side effects that usually do not require medical attention (report to your doctor or health care professional if they continue or are bothersome):  headache  pain in arms and legs  pain in mouth  stomach pain This list may not describe all possible side effects. Call your doctor for medical advice about side effects. You may report side effects to FDA at 1-800-FDA-1088. Where should I keep my medicine? This drug is given in a hospital or clinic   and will not be stored at home. NOTE: This sheet is a summary. It may not cover all possible information. If you have questions about this medicine, talk to your doctor, pharmacist, or health care provider.  2020 Elsevier/Gold Standard (2017-08-29 11:10:55)  

## 2020-05-22 ENCOUNTER — Other Ambulatory Visit: Payer: Self-pay | Admitting: Hematology

## 2020-05-23 ENCOUNTER — Inpatient Hospital Stay: Payer: PPO | Admitting: Hematology

## 2020-05-23 ENCOUNTER — Other Ambulatory Visit: Payer: Self-pay

## 2020-05-23 ENCOUNTER — Inpatient Hospital Stay: Payer: PPO

## 2020-05-23 VITALS — BP 161/58 | HR 57 | Temp 97.4°F | Resp 18 | Ht <= 58 in | Wt 105.1 lb

## 2020-05-23 DIAGNOSIS — C9110 Chronic lymphocytic leukemia of B-cell type not having achieved remission: Secondary | ICD-10-CM

## 2020-05-23 DIAGNOSIS — D693 Immune thrombocytopenic purpura: Secondary | ICD-10-CM

## 2020-05-23 DIAGNOSIS — I629 Nontraumatic intracranial hemorrhage, unspecified: Secondary | ICD-10-CM

## 2020-05-23 LAB — CBC WITH DIFFERENTIAL/PLATELET
Abs Immature Granulocytes: 0.02 10*3/uL (ref 0.00–0.07)
Basophils Absolute: 0 10*3/uL (ref 0.0–0.1)
Basophils Relative: 0 %
Eosinophils Absolute: 0 10*3/uL (ref 0.0–0.5)
Eosinophils Relative: 0 %
HCT: 43 % (ref 36.0–46.0)
Hemoglobin: 14.4 g/dL (ref 12.0–15.0)
Immature Granulocytes: 0 %
Lymphocytes Relative: 41 %
Lymphs Abs: 2.3 10*3/uL (ref 0.7–4.0)
MCH: 30.4 pg (ref 26.0–34.0)
MCHC: 33.5 g/dL (ref 30.0–36.0)
MCV: 90.9 fL (ref 80.0–100.0)
Monocytes Absolute: 0.7 10*3/uL (ref 0.1–1.0)
Monocytes Relative: 13 %
Neutro Abs: 2.6 10*3/uL (ref 1.7–7.7)
Neutrophils Relative %: 46 %
Platelets: 204 10*3/uL (ref 150–400)
RBC: 4.73 MIL/uL (ref 3.87–5.11)
RDW: 12.7 % (ref 11.5–15.5)
WBC: 5.7 10*3/uL (ref 4.0–10.5)
nRBC: 0 % (ref 0.0–0.2)

## 2020-05-23 MED ORDER — ROMIPLOSTIM 125 MCG ~~LOC~~ SOLR
2.0000 ug/kg | SUBCUTANEOUS | Status: DC
  Administered 2020-05-23: 95 ug via SUBCUTANEOUS
  Filled 2020-05-23: qty 0.19

## 2020-05-23 NOTE — Progress Notes (Signed)
HEMATOLOGY/ONCOLOGY CLINIC NOTE  Date of Service: 05/23/2020  Patient Care Team: [name], MD as PCP - General (Family Medicine) [name], MD as Consulting Physician (Hematology)  CHIEF COMPLAINTS/PURPOSE OF CONSULTATION:  F/u for CLL and ITP  HISTORY OF PRESENTING ILLNESS:   Selena Branch is a wonderful 79 y.o. female who has been transferred to [place] from Dr. Mathis Dad [place] for evaluation and management of CLL. The pt reports that she is doing well overall.  The pt reports that has been slightly fatigued in the past 1-2 months, but thinks it is from the heat. The fatigue does not affect her ability to function. Denies fevers, chills, and night sweats. She reports that she saw her PCP in August, who noticed that her blood counts were high and referred her to Dr. Irene Limbo.  Lab results today (05/31/2019) of CBC w diff and CMP is as follows: all values are WNL except for WBC at 39.1k, neutro abs at 1.6k, lymphs abs at 36.8k, monocytes abs at 0.0k, eosinophils abs at 0.8k, BUN at 27, Total protein at 6.4 05/31/2019 LDH is pending  On review of systems, pt reports mild fatigue and denies belly pain, infection issues and any other symptoms.   INTERVAL HISTORY: Selena Branch is a 79 y.o. 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 Pt is joined today by her daughter. The patient's last visit with [place] was on 04/18/2020. The pt reports that she is doing well overall.  The pt reports that she has experienced more fatigue over the last few weeks. Pt has had to take a nap in the afternoon a few times, which is abnormal for her. Pt is still able to complete all of her daily tasks. Pt received the annual flu vaccine the first of September. Pt plans on going to [facility] on October the 8th.  Lab results today (05/23/20) of CBC w/diff and CMP is as follows: all values are WNL.  On review of systems, pt reports fatigue and denies fevers, diarrhea, abdominal pain, pain along spine  and any other symptoms.   MEDICAL HISTORY:  Past Medical History:  Diagnosis Date  . Anxiety   . 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 Glaucoma   . Hyperlipemia   . Hypertension   . Osteoporosis     SURGICAL HISTORY: No past surgical history on file.  SOCIAL HISTORY: Social History   Socioeconomic History  . Marital status: Married    Spouse name: Not on file  . Number of children: 1  . Years of education: HS  . Highest education level: Not on file  Occupational History  . Occupation: Retired  Tobacco Use  . Smoking status: Never Smoker  . Smokeless tobacco: Never Used  Substance and Sexual Activity  . Alcohol use: No    Alcohol/week: 0.0 standard drinks  . Drug use: No  . Sexual activity: Not on file  Other Topics Concern  . Not on file  Social History Narrative   Drinks 2 cups of coffee   Social Determinants of Health   Financial Resource Strain:   . Difficulty of Paying Living Expenses: Not on file  Food Insecurity:   . Worried About Charity fundraiser in the Last Year: Not on file  . Ran Out of Food in the Last Year: Not on file  Transportation Needs:   . Lack of Transportation (Medical): Not on file  . Lack of Transportation (Non-Medical): Not on file  Physical  Activity:   . Days of Exercise per Week: Not on file  . Minutes of Exercise per Session: Not on file  Stress:   . Feeling of Stress : Not on file  Social Connections:   . Frequency of Communication with Friends and Family: Not on file  . Frequency of Social Gatherings with Friends and Family: Not on file  . Attends Religious Services: Not on file  . Active Member of Clubs or Organizations: Not on file  . Attends Archivist Meetings: Not on file  . Marital Status: Not on file  Intimate Partner Violence:   . Fear of Current or Ex-Partner: Not on file  . Emotionally Abused: Not on file  . Physically Abused: Not on file  . Sexually Abused: Not on file    FAMILY  HISTORY: Family History  Problem Relation Age of Onset  . Stroke Mother   . Lung cancer Father   . Leukemia Neg Hx     ALLERGIES:  is allergic to codeine.  MEDICATIONS:  Current Outpatient Medications  Medication Sig Dispense Refill  . amLODipine (NORVASC) 10 MG tablet Take 1 tablet (10 mg total) by mouth at bedtime. 30 tablet 0  . atorvastatin (LIPITOR) 40 MG tablet Take 40 mg by mouth daily.     . Brinzolamide-Brimonidine (SIMBRINZA) 1-0.2 % SUSP Place 1 drop into both eyes daily at 12 noon.    . Calcium Carbonate-Vitamin D (CALCIUM-D PO) Take 1 tablet by mouth daily.    . Cholecalciferol (VITAMIN D3) 2000 units TABS Take 2,000 Units by mouth daily.     . dorzolamide-timolol (COSOPT) 22.3-6.8 MG/ML ophthalmic solution Place 1 drop into both eyes daily.     . furosemide (LASIX) 20 MG tablet Take 40 mg for 3 days then resume your 20 mg daily (Patient taking differently: Take 20 mg by mouth daily. ) 30 tablet   . latanoprost (XALATAN) 0.005 % ophthalmic solution Place 1 drop into both eyes at bedtime.     . Multiple Vitamin (MULTIVITAMIN WITH MINERALS) TABS tablet Take 1 tablet by mouth daily.    . quinapril (ACCUPRIL) 40 MG tablet Take 40 mg by mouth daily.     . VENCLEXTA 100 MG TABS TAKE 2 TABLETS (200 MG) BY MOUTH DAILY. 60 tablet 2   No current facility-administered medications for this visit.    REVIEW OF SYSTEMS:   A 10+ POINT REVIEW OF SYSTEMS WAS OBTAINED including neurology, dermatology, psychiatry, cardiac, respiratory, lymph, extremities, GI, GU, Musculoskeletal, constitutional, breasts, reproductive, HEENT.  All pertinent positives are noted in the HPI.  All others are negative.   PHYSICAL EXAMINATION:  ECOG FS:2 - Symptomatic, <50% confined to bed  Vitals:   05/23/20 0858  BP: (!) 161/58  Pulse: (!) 57  Resp: 18  Temp: (!) 97.4 F (36.3 C)  SpO2: 100%   Wt Readings from Last 3 Encounters:  05/23/20 105 lb 1.6 oz (47.7 kg)  04/18/20 103 lb 4.8 oz (46.9 kg)    02/22/20 101 lb 3.2 oz (45.9 kg)   Body mass index is 21.97 kg/m.    Exam was given in a chair   GENERAL:alert, in no acute distress and comfortable SKIN: no acute rashes, no significant lesions EYES: conjunctiva are pink and non-injected, sclera anicteric OROPHARYNX: MMM, no exudates, no oropharyngeal erythema or ulceration NECK: supple, no JVD LYMPH:  no palpable lymphadenopathy in the cervical, axillary or inguinal regions LUNGS: clear to auscultation b/l with normal respiratory effort HEART: regular rate & rhythm  ABDOMEN:  normoactive bowel sounds , non tender, not distended. No palpable hepatosplenomegaly.  Extremity: no pedal edema PSYCH: alert & oriented x 3 with fluent speech NEURO: no focal motor/sensory deficits  LABORATORY DATA:  I have reviewed the data as listed  . CBC Latest Ref Rng & Units 05/23/2020 05/16/2020 05/09/2020  WBC 4.0 - 10.5 K/uL 5.7 6.0 5.7  Hemoglobin 12.0 - 15.0 g/dL 14.4 14.2 14.9  Hematocrit 36 - 46 % 43.0 42.3 43.1  Platelets 150 - 400 K/uL 204 103(L) 128(L)    . CMP Latest Ref Rng & Units 05/16/2020 04/18/2020 03/21/2020  Glucose 70 - 99 mg/dL 101(H) 92 93  BUN 8 - 23 mg/dL 17 22 23   Creatinine 0.44 - 1.00 mg/dL 0.79 0.83 0.82  Sodium 135 - 145 mmol/L 143 144 143  Potassium 3.5 - 5.1 mmol/L 4.9 4.8 4.8  Chloride 98 - 111 mmol/L 109 107 108  CO2 22 - 32 mmol/L 25 29 27   Calcium 8.9 - 10.3 mg/dL 9.5 10.2 9.9  Total Protein 6.5 - 8.1 g/dL 6.2(L) 6.4(L) 6.5  Total Bilirubin 0.3 - 1.2 mg/dL 0.5 0.7 0.7  Alkaline Phos 38 - 126 U/L 93 104 99  AST 15 - 41 U/L 19 22 22   ALT 0 - 44 U/L 9 12 13    02/27/2019 FISH:    RADIOGRAPHIC STUDIES: I have personally reviewed the radiological images as listed and agreed with the findings in the report. No results found.  ASSESSMENT & PLAN:   #1 CLL Previous CLL prognostic FISH panel without detectable mutations. Has been Rai stage 0.. Now with new anemia and severe thrombocytopenia. WBC counts have  increased significantly to 84k from 39k 3 months ago.  Concerning for change in tempo of her disease. -08/31/2019 CT C/A/P ([id number]) ([id number]) revealed "1. Mild multistation lymphadenopathy in the retroperitoneum and bilateral pelvis as detailed, compatible with lymphoma. Normal size spleen. 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  However cannot rule out bone marrow involvement as an etiology of thrombocytopenia as well.  #3 new mild normocytic anemia.  Hemoglobin 11.7 with an MCV of 97.4. --- now resolved hgb today 14.8 Likely related to her CLL. 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  #4 h/o intraparenchymal bleed- rpt CT head stable (due to uncontrolled HTN and thrombocytopenia)  PLAN: -Discussed pt labwork today, 05/23/20; blood counts are nml. -No lab or clinical evidence of CLL progression at this time.  -The pt has no prohibitive toxicities from continuing 200 mg Venetoclax daily at this time.  -Recommended that the pt continue to eat well, drink at least 48-64 oz of water each day, and walk 20-30 minutes each day.  -Discussed CDC guidelines regarding the [place] booster in the context of pt's immunosuppression. Pt plans to receive in 1 week.  -Recommend pt wait two weeks between vaccinations.  -Discussed proper safety precautions when traveling. -Continue weekly N-Plate, Goal Platelets >50K -Will see back in 8 weeks with labs    FOLLOW UP: Plz continue weekly labs and Nplate x 12 weeks MD visit in 8 weeks +/- 1 week Okay to get covid booster vaccine in 1 week (I.e 2 weeks from her flu shot on 05/14/2020)    The total time spent in the appt was 20 minutes and more than 50% was on counseling and direct patient cares.  All of the patient's questions were answered with apparent satisfaction. The  patient knows to call the clinic with any problems, questions or concerns.  Sullivan Lone MD [place] AAHIVMS [facility] Hematology/Oncology Physician [facility]  (Office):       [phone] (Work cell):  [phone] (Fax):           [phone]  05/23/2020 9:35 AM  I, Yevette Edwards, am acting as a scribe for Dr. Sullivan Lone.   .I have reviewed the above documentation for accuracy and completeness, and I agree with the above. [name] MD

## 2020-05-23 NOTE — Patient Instructions (Signed)
Romiplostim injection What is this medicine? ROMIPLOSTIM (roe mi PLOE stim) helps your body make more platelets. This medicine is used to treat low platelets caused by chronic idiopathic thrombocytopenic purpura (ITP). This medicine may be used for other purposes; ask your health care provider or pharmacist if you have questions. COMMON BRAND NAME(S): Nplate What should I tell my health care provider before I take this medicine? They need to know if you have any of these conditions:  bleeding disorders  bone marrow problem, like blood cancer or myelodysplastic syndrome  history of blood clots  liver disease  surgery to remove your spleen  an unusual or allergic reaction to romiplostim, mannitol, other medicines, foods, dyes, or preservatives  pregnant or trying to get pregnant  breast-feeding How should I use this medicine? This medicine is for injection under the skin. It is given by a health care professional in a hospital or clinic setting. A special MedGuide will be given to you before your injection. Read this information carefully each time. Talk to your pediatrician regarding the use of this medicine in children. While this drug may be prescribed for children as young as 1 year for selected conditions, precautions do apply. Overdosage: If you think you have taken too much of this medicine contact a poison control center or emergency room at once. NOTE: This medicine is only for you. Do not share this medicine with others. What if I miss a dose? It is important not to miss your dose. Call your doctor or health care professional if you are unable to keep an appointment. What may interact with this medicine? Interactions are not expected. This list may not describe all possible interactions. Give your health care provider a list of all the medicines, herbs, non-prescription drugs, or dietary supplements you use. Also tell them if you smoke, drink alcohol, or use illegal drugs.  Some items may interact with your medicine. What should I watch for while using this medicine? Your condition will be monitored carefully while you are receiving this medicine. Visit your prescriber or health care professional for regular checks on your progress and for the needed blood tests. It is important to keep all appointments. What side effects may I notice from receiving this medicine? Side effects that you should report to your doctor or health care professional as soon as possible:  allergic reactions like skin rash, itching or hives, swelling of the face, lips, or tongue  signs and symptoms of bleeding such as bloody or black, tarry stools; red or dark brown urine; spitting up blood or brown material that looks like coffee grounds; red spots on the skin; unusual bruising or bleeding from the eyes, gums, or nose  signs and symptoms of a blood clot such as chest pain; shortness of breath; pain, swelling, or warmth in the leg  signs and symptoms of a stroke like changes in vision; confusion; trouble speaking or understanding; severe headaches; sudden numbness or weakness of the face, arm or leg; trouble walking; dizziness; loss of balance or coordination Side effects that usually do not require medical attention (report to your doctor or health care professional if they continue or are bothersome):  headache  pain in arms and legs  pain in mouth  stomach pain This list may not describe all possible side effects. Call your doctor for medical advice about side effects. You may report side effects to FDA at 1-800-FDA-1088. Where should I keep my medicine? This drug is given in a hospital or clinic   and will not be stored at home. NOTE: This sheet is a summary. It may not cover all possible information. If you have questions about this medicine, talk to your doctor, pharmacist, or health care provider.  2020 Elsevier/Gold Standard (2017-08-29 11:10:55)  

## 2020-05-30 ENCOUNTER — Inpatient Hospital Stay: Payer: PPO

## 2020-05-30 ENCOUNTER — Other Ambulatory Visit: Payer: Self-pay

## 2020-05-30 VITALS — BP 151/61 | HR 62 | Temp 98.0°F | Resp 18

## 2020-05-30 DIAGNOSIS — D693 Immune thrombocytopenic purpura: Secondary | ICD-10-CM

## 2020-05-30 DIAGNOSIS — C9110 Chronic lymphocytic leukemia of B-cell type not having achieved remission: Secondary | ICD-10-CM

## 2020-05-30 DIAGNOSIS — I629 Nontraumatic intracranial hemorrhage, unspecified: Secondary | ICD-10-CM

## 2020-05-30 DIAGNOSIS — Z23 Encounter for immunization: Secondary | ICD-10-CM

## 2020-05-30 LAB — CBC WITH DIFFERENTIAL/PLATELET
Abs Immature Granulocytes: 0.02 10*3/uL (ref 0.00–0.07)
Basophils Absolute: 0 10*3/uL (ref 0.0–0.1)
Basophils Relative: 0 %
Eosinophils Absolute: 0 10*3/uL (ref 0.0–0.5)
Eosinophils Relative: 0 %
HCT: 41 % (ref 36.0–46.0)
Hemoglobin: 14.1 g/dL (ref 12.0–15.0)
Immature Granulocytes: 0 %
Lymphocytes Relative: 46 %
Lymphs Abs: 2.4 10*3/uL (ref 0.7–4.0)
MCH: 30.9 pg (ref 26.0–34.0)
MCHC: 34.4 g/dL (ref 30.0–36.0)
MCV: 89.7 fL (ref 80.0–100.0)
Monocytes Absolute: 0.8 10*3/uL (ref 0.1–1.0)
Monocytes Relative: 15 %
Neutro Abs: 2 10*3/uL (ref 1.7–7.7)
Neutrophils Relative %: 39 %
Platelets: 130 10*3/uL — ABNORMAL LOW (ref 150–400)
RBC: 4.57 MIL/uL (ref 3.87–5.11)
RDW: 12.9 % (ref 11.5–15.5)
WBC: 5.2 10*3/uL (ref 4.0–10.5)
nRBC: 0 % (ref 0.0–0.2)

## 2020-05-30 MED ORDER — ROMIPLOSTIM 125 MCG ~~LOC~~ SOLR
2.0000 ug/kg | SUBCUTANEOUS | Status: DC
  Administered 2020-05-30: 95 ug via SUBCUTANEOUS
  Filled 2020-05-30: qty 0.19

## 2020-05-30 NOTE — Progress Notes (Signed)
   Covid-19 Vaccination Clinic  Name:  Selena Branch    MRN: [id number] DOB: July 08, 1941  05/30/2020  [name] was observed post Covid-19 immunization for 15 minutes without incident. She was provided with Vaccine Information Sheet and instruction to access the V-Safe system.   [name] was instructed to call 911 with any severe reactions post vaccine: Marland Kitchen Difficulty breathing  . Swelling of face and throat  . A fast heartbeat  . A bad rash all over body  . Dizziness and weakness   Immunizations Administered    Name Date Dose VIS Date Route   Pfizer COVID-19 Vaccine 05/30/2020 10:49 AM 0.3 mL 11/07/2018 Intramuscular   Manufacturer: [place]   Lot: [id number]   [place]: [id number]

## 2020-06-02 MED FILL — VENCLEXTA 100 MG TABS: 100 | 30 days supply | Qty: 60 | Fill #0

## 2020-06-06 ENCOUNTER — Inpatient Hospital Stay: Payer: PPO

## 2020-06-06 ENCOUNTER — Other Ambulatory Visit: Payer: Self-pay

## 2020-06-06 VITALS — BP 160/66 | HR 60 | Temp 98.1°F | Resp 18

## 2020-06-06 DIAGNOSIS — D693 Immune thrombocytopenic purpura: Secondary | ICD-10-CM | POA: Diagnosis not present

## 2020-06-06 DIAGNOSIS — C9110 Chronic lymphocytic leukemia of B-cell type not having achieved remission: Secondary | ICD-10-CM

## 2020-06-06 DIAGNOSIS — I629 Nontraumatic intracranial hemorrhage, unspecified: Secondary | ICD-10-CM

## 2020-06-06 LAB — CBC WITH DIFFERENTIAL/PLATELET
Abs Immature Granulocytes: 0.02 10*3/uL (ref 0.00–0.07)
Basophils Absolute: 0 10*3/uL (ref 0.0–0.1)
Basophils Relative: 0 %
Eosinophils Absolute: 0 10*3/uL (ref 0.0–0.5)
Eosinophils Relative: 0 %
HCT: 42.9 % (ref 36.0–46.0)
Hemoglobin: 14.5 g/dL (ref 12.0–15.0)
Immature Granulocytes: 0 %
Lymphocytes Relative: 51 %
Lymphs Abs: 2.7 10*3/uL (ref 0.7–4.0)
MCH: 30.3 pg (ref 26.0–34.0)
MCHC: 33.8 g/dL (ref 30.0–36.0)
MCV: 89.7 fL (ref 80.0–100.0)
Monocytes Absolute: 0.8 10*3/uL (ref 0.1–1.0)
Monocytes Relative: 15 %
Neutro Abs: 1.9 10*3/uL (ref 1.7–7.7)
Neutrophils Relative %: 34 %
Platelets: 180 10*3/uL (ref 150–400)
RBC: 4.78 MIL/uL (ref 3.87–5.11)
RDW: 12.6 % (ref 11.5–15.5)
WBC: 5.4 10*3/uL (ref 4.0–10.5)
nRBC: 0 % (ref 0.0–0.2)

## 2020-06-06 LAB — CMP (CANCER CENTER ONLY)
ALT: 15 U/L (ref 0–44)
AST: 22 U/L (ref 15–41)
Albumin: 4 g/dL (ref 3.5–5.0)
Alkaline Phosphatase: 97 U/L (ref 38–126)
Anion gap: 6 (ref 5–15)
BUN: 17 mg/dL (ref 8–23)
CO2: 32 mmol/L (ref 22–32)
Calcium: 10.1 mg/dL (ref 8.9–10.3)
Chloride: 105 mmol/L (ref 98–111)
Creatinine: 0.83 mg/dL (ref 0.44–1.00)
GFR, Est AFR Am: >60 mL/min (ref >60)
GFR, Estimated: >60 mL/min (ref >60)
Glucose, Bld: 97 mg/dL (ref 70–99)
Potassium: 5.4 mmol/L — ABNORMAL HIGH (ref 3.5–5.1)
Sodium: 143 mmol/L (ref 135–145)
Total Bilirubin: 0.7 mg/dL (ref 0.3–1.2)
Total Protein: 6.6 g/dL (ref 6.5–8.1)

## 2020-06-06 MED ORDER — ROMIPLOSTIM 125 MCG ~~LOC~~ SOLR
95.0000 ug | SUBCUTANEOUS | Status: DC
  Administered 2020-06-06: 95 ug via SUBCUTANEOUS
  Filled 2020-06-06: qty 0.19

## 2020-06-06 NOTE — Patient Instructions (Signed)
Romiplostim injection What is this medicine? ROMIPLOSTIM (roe mi PLOE stim) helps your body make more platelets. This medicine is used to treat low platelets caused by chronic idiopathic thrombocytopenic purpura (ITP). This medicine may be used for other purposes; ask your health care provider or pharmacist if you have questions. COMMON BRAND NAME(S): Nplate What should I tell my health care provider before I take this medicine? They need to know if you have any of these conditions:  bleeding disorders  bone marrow problem, like blood cancer or myelodysplastic syndrome  history of blood clots  liver disease  surgery to remove your spleen  an unusual or allergic reaction to romiplostim, mannitol, other medicines, foods, dyes, or preservatives  pregnant or trying to get pregnant  breast-feeding How should I use this medicine? This medicine is for injection under the skin. It is given by a health care professional in a hospital or clinic setting. A special MedGuide will be given to you before your injection. Read this information carefully each time. Talk to your pediatrician regarding the use of this medicine in children. While this drug may be prescribed for children as young as 1 year for selected conditions, precautions do apply. Overdosage: If you think you have taken too much of this medicine contact a poison control center or emergency room at once. NOTE: This medicine is only for you. Do not share this medicine with others. What if I miss a dose? It is important not to miss your dose. Call your doctor or health care professional if you are unable to keep an appointment. What may interact with this medicine? Interactions are not expected. This list may not describe all possible interactions. Give your health care provider a list of all the medicines, herbs, non-prescription drugs, or dietary supplements you use. Also tell them if you smoke, drink alcohol, or use illegal drugs.  Some items may interact with your medicine. What should I watch for while using this medicine? Your condition will be monitored carefully while you are receiving this medicine. Visit your prescriber or health care professional for regular checks on your progress and for the needed blood tests. It is important to keep all appointments. What side effects may I notice from receiving this medicine? Side effects that you should report to your doctor or health care professional as soon as possible:  allergic reactions like skin rash, itching or hives, swelling of the face, lips, or tongue  signs and symptoms of bleeding such as bloody or black, tarry stools; red or dark brown urine; spitting up blood or brown material that looks like coffee grounds; red spots on the skin; unusual bruising or bleeding from the eyes, gums, or nose  signs and symptoms of a blood clot such as chest pain; shortness of breath; pain, swelling, or warmth in the leg  signs and symptoms of a stroke like changes in vision; confusion; trouble speaking or understanding; severe headaches; sudden numbness or weakness of the face, arm or leg; trouble walking; dizziness; loss of balance or coordination Side effects that usually do not require medical attention (report to your doctor or health care professional if they continue or are bothersome):  headache  pain in arms and legs  pain in mouth  stomach pain This list may not describe all possible side effects. Call your doctor for medical advice about side effects. You may report side effects to FDA at 1-800-FDA-1088. Where should I keep my medicine? This drug is given in a hospital or clinic   and will not be stored at home. NOTE: This sheet is a summary. It may not cover all possible information. If you have questions about this medicine, talk to your doctor, pharmacist, or health care provider.  2020 Elsevier/Gold Standard (2017-08-29 11:10:55)  

## 2020-06-13 ENCOUNTER — Other Ambulatory Visit: Payer: Self-pay

## 2020-06-13 ENCOUNTER — Inpatient Hospital Stay: Payer: PPO

## 2020-06-13 ENCOUNTER — Inpatient Hospital Stay: Payer: PPO | Attending: Hematology

## 2020-06-13 VITALS — BP 167/61 | HR 65 | Resp 18

## 2020-06-13 DIAGNOSIS — D649 Anemia, unspecified: Secondary | ICD-10-CM | POA: Diagnosis not present

## 2020-06-13 DIAGNOSIS — D693 Immune thrombocytopenic purpura: Secondary | ICD-10-CM

## 2020-06-13 DIAGNOSIS — Z801 Family history of malignant neoplasm of trachea, bronchus and lung: Secondary | ICD-10-CM | POA: Insufficient documentation

## 2020-06-13 DIAGNOSIS — I629 Nontraumatic intracranial hemorrhage, unspecified: Secondary | ICD-10-CM

## 2020-06-13 DIAGNOSIS — I10 Essential (primary) hypertension: Secondary | ICD-10-CM | POA: Diagnosis not present

## 2020-06-13 DIAGNOSIS — Z79899 Other long term (current) drug therapy: Secondary | ICD-10-CM | POA: Diagnosis not present

## 2020-06-13 DIAGNOSIS — C9110 Chronic lymphocytic leukemia of B-cell type not having achieved remission: Secondary | ICD-10-CM

## 2020-06-13 LAB — CBC WITH DIFFERENTIAL/PLATELET
Abs Immature Granulocytes: 0.01 10*3/uL (ref 0.00–0.07)
Basophils Absolute: 0 10*3/uL (ref 0.0–0.1)
Basophils Relative: 0 %
Eosinophils Absolute: 0 10*3/uL (ref 0.0–0.5)
Eosinophils Relative: 0 %
HCT: 42.7 % (ref 36.0–46.0)
Hemoglobin: 14.7 g/dL (ref 12.0–15.0)
Immature Granulocytes: 0 %
Lymphocytes Relative: 28 %
Lymphs Abs: 1.6 10*3/uL (ref 0.7–4.0)
MCH: 31.3 pg (ref 26.0–34.0)
MCHC: 34.4 g/dL (ref 30.0–36.0)
MCV: 90.9 fL (ref 80.0–100.0)
Monocytes Absolute: 0.8 10*3/uL (ref 0.1–1.0)
Monocytes Relative: 14 %
Neutro Abs: 3.2 10*3/uL (ref 1.7–7.7)
Neutrophils Relative %: 58 %
Platelets: 172 10*3/uL (ref 150–400)
RBC: 4.7 MIL/uL (ref 3.87–5.11)
RDW: 12.7 % (ref 11.5–15.5)
WBC: 5.6 10*3/uL (ref 4.0–10.5)
nRBC: 0 % (ref 0.0–0.2)

## 2020-06-13 MED ORDER — ROMIPLOSTIM 125 MCG ~~LOC~~ SOLR
95.0000 ug | SUBCUTANEOUS | Status: DC
  Administered 2020-06-13: 95 ug via SUBCUTANEOUS
  Filled 2020-06-13: qty 0.19

## 2020-06-19 ENCOUNTER — Inpatient Hospital Stay: Payer: PPO

## 2020-06-19 ENCOUNTER — Other Ambulatory Visit: Payer: Self-pay

## 2020-06-19 VITALS — BP 159/67 | HR 71 | Temp 98.0°F | Resp 18

## 2020-06-19 DIAGNOSIS — C9110 Chronic lymphocytic leukemia of B-cell type not having achieved remission: Secondary | ICD-10-CM

## 2020-06-19 DIAGNOSIS — D693 Immune thrombocytopenic purpura: Secondary | ICD-10-CM

## 2020-06-19 DIAGNOSIS — I629 Nontraumatic intracranial hemorrhage, unspecified: Secondary | ICD-10-CM

## 2020-06-19 LAB — CBC WITH DIFFERENTIAL/PLATELET
Abs Immature Granulocytes: 0.01 10*3/uL (ref 0.00–0.07)
Basophils Absolute: 0 10*3/uL (ref 0.0–0.1)
Basophils Relative: 0 %
Eosinophils Absolute: 0 10*3/uL (ref 0.0–0.5)
Eosinophils Relative: 0 %
HCT: 42.5 % (ref 36.0–46.0)
Hemoglobin: 14.6 g/dL (ref 12.0–15.0)
Immature Granulocytes: 0 %
Lymphocytes Relative: 44 %
Lymphs Abs: 2.3 10*3/uL (ref 0.7–4.0)
MCH: 31.1 pg (ref 26.0–34.0)
MCHC: 34.4 g/dL (ref 30.0–36.0)
MCV: 90.4 fL (ref 80.0–100.0)
Monocytes Absolute: 0.8 10*3/uL (ref 0.1–1.0)
Monocytes Relative: 14 %
Neutro Abs: 2.2 10*3/uL (ref 1.7–7.7)
Neutrophils Relative %: 42 %
Platelets: 155 10*3/uL (ref 150–400)
RBC: 4.7 MIL/uL (ref 3.87–5.11)
RDW: 12.6 % (ref 11.5–15.5)
WBC: 5.3 10*3/uL (ref 4.0–10.5)
nRBC: 0 % (ref 0.0–0.2)

## 2020-06-19 MED ORDER — ROMIPLOSTIM 125 MCG ~~LOC~~ SOLR
95.0000 ug | SUBCUTANEOUS | Status: DC
  Administered 2020-06-19: 95 ug via SUBCUTANEOUS
  Filled 2020-06-19: qty 0.19

## 2020-06-19 NOTE — Patient Instructions (Signed)
Romiplostim injection What is this medicine? ROMIPLOSTIM (roe mi PLOE stim) helps your body make more platelets. This medicine is used to treat low platelets caused by chronic idiopathic thrombocytopenic purpura (ITP). This medicine may be used for other purposes; ask your health care provider or pharmacist if you have questions. COMMON BRAND NAME(S): Nplate What should I tell my health care provider before I take this medicine? They need to know if you have any of these conditions:  bleeding disorders  bone marrow problem, like blood cancer or myelodysplastic syndrome  history of blood clots  liver disease  surgery to remove your spleen  an unusual or allergic reaction to romiplostim, mannitol, other medicines, foods, dyes, or preservatives  pregnant or trying to get pregnant  breast-feeding How should I use this medicine? This medicine is for injection under the skin. It is given by a health care professional in a hospital or clinic setting. A special MedGuide will be given to you before your injection. Read this information carefully each time. Talk to your pediatrician regarding the use of this medicine in children. While this drug may be prescribed for children as young as 1 year for selected conditions, precautions do apply. Overdosage: If you think you have taken too much of this medicine contact a poison control center or emergency room at once. NOTE: This medicine is only for you. Do not share this medicine with others. What if I miss a dose? It is important not to miss your dose. Call your doctor or health care professional if you are unable to keep an appointment. What may interact with this medicine? Interactions are not expected. This list may not describe all possible interactions. Give your health care provider a list of all the medicines, herbs, non-prescription drugs, or dietary supplements you use. Also tell them if you smoke, drink alcohol, or use illegal drugs.  Some items may interact with your medicine. What should I watch for while using this medicine? Your condition will be monitored carefully while you are receiving this medicine. Visit your prescriber or health care professional for regular checks on your progress and for the needed blood tests. It is important to keep all appointments. What side effects may I notice from receiving this medicine? Side effects that you should report to your doctor or health care professional as soon as possible:  allergic reactions like skin rash, itching or hives, swelling of the face, lips, or tongue  signs and symptoms of bleeding such as bloody or black, tarry stools; red or dark brown urine; spitting up blood or brown material that looks like coffee grounds; red spots on the skin; unusual bruising or bleeding from the eyes, gums, or nose  signs and symptoms of a blood clot such as chest pain; shortness of breath; pain, swelling, or warmth in the leg  signs and symptoms of a stroke like changes in vision; confusion; trouble speaking or understanding; severe headaches; sudden numbness or weakness of the face, arm or leg; trouble walking; dizziness; loss of balance or coordination Side effects that usually do not require medical attention (report to your doctor or health care professional if they continue or are bothersome):  headache  pain in arms and legs  pain in mouth  stomach pain This list may not describe all possible side effects. Call your doctor for medical advice about side effects. You may report side effects to FDA at 1-800-FDA-1088. Where should I keep my medicine? This drug is given in a hospital or clinic   and will not be stored at home. NOTE: This sheet is a summary. It may not cover all possible information. If you have questions about this medicine, talk to your doctor, pharmacist, or health care provider.  2020 Elsevier/Gold Standard (2017-08-29 11:10:55)  

## 2020-06-20 ENCOUNTER — Other Ambulatory Visit: Payer: PPO

## 2020-06-20 ENCOUNTER — Ambulatory Visit: Payer: PPO

## 2020-06-27 ENCOUNTER — Inpatient Hospital Stay: Payer: PPO

## 2020-06-27 ENCOUNTER — Other Ambulatory Visit: Payer: Self-pay

## 2020-06-27 VITALS — BP 156/70 | HR 67 | Resp 18

## 2020-06-27 DIAGNOSIS — D693 Immune thrombocytopenic purpura: Secondary | ICD-10-CM | POA: Diagnosis not present

## 2020-06-27 DIAGNOSIS — C9110 Chronic lymphocytic leukemia of B-cell type not having achieved remission: Secondary | ICD-10-CM

## 2020-06-27 DIAGNOSIS — I629 Nontraumatic intracranial hemorrhage, unspecified: Secondary | ICD-10-CM

## 2020-06-27 LAB — CBC WITH DIFFERENTIAL/PLATELET
Abs Immature Granulocytes: 0.01 10*3/uL (ref 0.00–0.07)
Basophils Absolute: 0 10*3/uL (ref 0.0–0.1)
Basophils Relative: 0 %
Eosinophils Absolute: 0 10*3/uL (ref 0.0–0.5)
Eosinophils Relative: 0 %
HCT: 44 % (ref 36.0–46.0)
Hemoglobin: 15.1 g/dL — ABNORMAL HIGH (ref 12.0–15.0)
Immature Granulocytes: 0 %
Lymphocytes Relative: 41 %
Lymphs Abs: 2.5 10*3/uL (ref 0.7–4.0)
MCH: 30.9 pg (ref 26.0–34.0)
MCHC: 34.3 g/dL (ref 30.0–36.0)
MCV: 90 fL (ref 80.0–100.0)
Monocytes Absolute: 0.8 10*3/uL (ref 0.1–1.0)
Monocytes Relative: 13 %
Neutro Abs: 2.8 10*3/uL (ref 1.7–7.7)
Neutrophils Relative %: 46 %
Platelets: 200 10*3/uL (ref 150–400)
RBC: 4.89 MIL/uL (ref 3.87–5.11)
RDW: 12.6 % (ref 11.5–15.5)
WBC: 6.2 10*3/uL (ref 4.0–10.5)
nRBC: 0 % (ref 0.0–0.2)

## 2020-06-27 LAB — CMP (CANCER CENTER ONLY)
ALT: 14 U/L (ref 0–44)
AST: 22 U/L (ref 15–41)
Albumin: 4 g/dL (ref 3.5–5.0)
Alkaline Phosphatase: 102 U/L (ref 38–126)
Anion gap: 4 — ABNORMAL LOW (ref 5–15)
BUN: 18 mg/dL (ref 8–23)
CO2: 32 mmol/L (ref 22–32)
Calcium: 10.2 mg/dL (ref 8.9–10.3)
Chloride: 107 mmol/L (ref 98–111)
Creatinine: 0.82 mg/dL (ref 0.44–1.00)
GFR, Estimated: >60 mL/min (ref >60)
Glucose, Bld: 92 mg/dL (ref 70–99)
Potassium: 4.6 mmol/L (ref 3.5–5.1)
Sodium: 143 mmol/L (ref 135–145)
Total Bilirubin: 0.6 mg/dL (ref 0.3–1.2)
Total Protein: 6.7 g/dL (ref 6.5–8.1)

## 2020-06-27 MED ORDER — ROMIPLOSTIM 125 MCG ~~LOC~~ SOLR
95.0000 ug | SUBCUTANEOUS | Status: DC
  Administered 2020-06-27: 95 ug via SUBCUTANEOUS
  Filled 2020-06-27: qty 0.19

## 2020-06-27 NOTE — Patient Instructions (Signed)
Romiplostim injection What is this medicine? ROMIPLOSTIM (roe mi PLOE stim) helps your body make more platelets. This medicine is used to treat low platelets caused by chronic idiopathic thrombocytopenic purpura (ITP). This medicine may be used for other purposes; ask your health care provider or pharmacist if you have questions. COMMON BRAND NAME(S): Nplate What should I tell my health care provider before I take this medicine? They need to know if you have any of these conditions:  bleeding disorders  bone marrow problem, like blood cancer or myelodysplastic syndrome  history of blood clots  liver disease  surgery to remove your spleen  an unusual or allergic reaction to romiplostim, mannitol, other medicines, foods, dyes, or preservatives  pregnant or trying to get pregnant  breast-feeding How should I use this medicine? This medicine is for injection under the skin. It is given by a health care professional in a hospital or clinic setting. A special MedGuide will be given to you before your injection. Read this information carefully each time. Talk to your pediatrician regarding the use of this medicine in children. While this drug may be prescribed for children as young as 1 year for selected conditions, precautions do apply. Overdosage: If you think you have taken too much of this medicine contact a poison control center or emergency room at once. NOTE: This medicine is only for you. Do not share this medicine with others. What if I miss a dose? It is important not to miss your dose. Call your doctor or health care professional if you are unable to keep an appointment. What may interact with this medicine? Interactions are not expected. This list may not describe all possible interactions. Give your health care provider a list of all the medicines, herbs, non-prescription drugs, or dietary supplements you use. Also tell them if you smoke, drink alcohol, or use illegal drugs.  Some items may interact with your medicine. What should I watch for while using this medicine? Your condition will be monitored carefully while you are receiving this medicine. Visit your prescriber or health care professional for regular checks on your progress and for the needed blood tests. It is important to keep all appointments. What side effects may I notice from receiving this medicine? Side effects that you should report to your doctor or health care professional as soon as possible:  allergic reactions like skin rash, itching or hives, swelling of the face, lips, or tongue  signs and symptoms of bleeding such as bloody or black, tarry stools; red or dark brown urine; spitting up blood or brown material that looks like coffee grounds; red spots on the skin; unusual bruising or bleeding from the eyes, gums, or nose  signs and symptoms of a blood clot such as chest pain; shortness of breath; pain, swelling, or warmth in the leg  signs and symptoms of a stroke like changes in vision; confusion; trouble speaking or understanding; severe headaches; sudden numbness or weakness of the face, arm or leg; trouble walking; dizziness; loss of balance or coordination Side effects that usually do not require medical attention (report to your doctor or health care professional if they continue or are bothersome):  headache  pain in arms and legs  pain in mouth  stomach pain This list may not describe all possible side effects. Call your doctor for medical advice about side effects. You may report side effects to FDA at 1-800-FDA-1088. Where should I keep my medicine? This drug is given in a hospital or clinic   and will not be stored at home. NOTE: This sheet is a summary. It may not cover all possible information. If you have questions about this medicine, talk to your doctor, pharmacist, or health care provider.  2020 Elsevier/Gold Standard (2017-08-29 11:10:55)  

## 2020-07-01 MED FILL — VENCLEXTA 100 MG TABS: 100 | 30 days supply | Qty: 60 | Fill #1

## 2020-07-04 ENCOUNTER — Other Ambulatory Visit: Payer: Self-pay

## 2020-07-04 ENCOUNTER — Inpatient Hospital Stay: Payer: PPO

## 2020-07-04 VITALS — BP 138/63 | HR 57 | Temp 97.0°F | Resp 18

## 2020-07-04 DIAGNOSIS — D693 Immune thrombocytopenic purpura: Secondary | ICD-10-CM

## 2020-07-04 DIAGNOSIS — C9110 Chronic lymphocytic leukemia of B-cell type not having achieved remission: Secondary | ICD-10-CM

## 2020-07-04 DIAGNOSIS — I629 Nontraumatic intracranial hemorrhage, unspecified: Secondary | ICD-10-CM

## 2020-07-04 LAB — CBC WITH DIFFERENTIAL/PLATELET
Abs Immature Granulocytes: 0.01 10*3/uL (ref 0.00–0.07)
Basophils Absolute: 0 10*3/uL (ref 0.0–0.1)
Basophils Relative: 0 %
Eosinophils Absolute: 0.1 10*3/uL (ref 0.0–0.5)
Eosinophils Relative: 2 %
HCT: 41.8 % (ref 36.0–46.0)
Hemoglobin: 14 g/dL (ref 12.0–15.0)
Immature Granulocytes: 0 %
Lymphocytes Relative: 51 %
Lymphs Abs: 2.8 10*3/uL (ref 0.7–4.0)
MCH: 30.8 pg (ref 26.0–34.0)
MCHC: 33.5 g/dL (ref 30.0–36.0)
MCV: 92.1 fL (ref 80.0–100.0)
Monocytes Absolute: 0.7 10*3/uL (ref 0.1–1.0)
Monocytes Relative: 13 %
Neutro Abs: 1.9 10*3/uL (ref 1.7–7.7)
Neutrophils Relative %: 34 %
Platelets: 132 10*3/uL — ABNORMAL LOW (ref 150–400)
RBC: 4.54 MIL/uL (ref 3.87–5.11)
RDW: 12.7 % (ref 11.5–15.5)
WBC: 5.5 10*3/uL (ref 4.0–10.5)
nRBC: 0 % (ref 0.0–0.2)

## 2020-07-04 MED ORDER — ROMIPLOSTIM 125 MCG ~~LOC~~ SOLR
95.0000 ug | SUBCUTANEOUS | Status: DC
  Administered 2020-07-04: 95 ug via SUBCUTANEOUS
  Filled 2020-07-04: qty 0.19

## 2020-07-04 NOTE — Patient Instructions (Signed)
Romiplostim injection What is this medicine? ROMIPLOSTIM (roe mi PLOE stim) helps your body make more platelets. This medicine is used to treat low platelets caused by chronic idiopathic thrombocytopenic purpura (ITP). This medicine may be used for other purposes; ask your health care provider or pharmacist if you have questions. COMMON BRAND NAME(S): Nplate What should I tell my health care provider before I take this medicine? They need to know if you have any of these conditions:  bleeding disorders  bone marrow problem, like blood cancer or myelodysplastic syndrome  history of blood clots  liver disease  surgery to remove your spleen  an unusual or allergic reaction to romiplostim, mannitol, other medicines, foods, dyes, or preservatives  pregnant or trying to get pregnant  breast-feeding How should I use this medicine? This medicine is for injection under the skin. It is given by a health care professional in a hospital or clinic setting. A special MedGuide will be given to you before your injection. Read this information carefully each time. Talk to your pediatrician regarding the use of this medicine in children. While this drug may be prescribed for children as young as 1 year for selected conditions, precautions do apply. Overdosage: If you think you have taken too much of this medicine contact a poison control center or emergency room at once. NOTE: This medicine is only for you. Do not share this medicine with others. What if I miss a dose? It is important not to miss your dose. Call your doctor or health care professional if you are unable to keep an appointment. What may interact with this medicine? Interactions are not expected. This list may not describe all possible interactions. Give your health care provider a list of all the medicines, herbs, non-prescription drugs, or dietary supplements you use. Also tell them if you smoke, drink alcohol, or use illegal drugs.  Some items may interact with your medicine. What should I watch for while using this medicine? Your condition will be monitored carefully while you are receiving this medicine. Visit your prescriber or health care professional for regular checks on your progress and for the needed blood tests. It is important to keep all appointments. What side effects may I notice from receiving this medicine? Side effects that you should report to your doctor or health care professional as soon as possible:  allergic reactions like skin rash, itching or hives, swelling of the face, lips, or tongue  signs and symptoms of bleeding such as bloody or black, tarry stools; red or dark brown urine; spitting up blood or brown material that looks like coffee grounds; red spots on the skin; unusual bruising or bleeding from the eyes, gums, or nose  signs and symptoms of a blood clot such as chest pain; shortness of breath; pain, swelling, or warmth in the leg  signs and symptoms of a stroke like changes in vision; confusion; trouble speaking or understanding; severe headaches; sudden numbness or weakness of the face, arm or leg; trouble walking; dizziness; loss of balance or coordination Side effects that usually do not require medical attention (report to your doctor or health care professional if they continue or are bothersome):  headache  pain in arms and legs  pain in mouth  stomach pain This list may not describe all possible side effects. Call your doctor for medical advice about side effects. You may report side effects to FDA at 1-800-FDA-1088. Where should I keep my medicine? This drug is given in a hospital or clinic   and will not be stored at home. NOTE: This sheet is a summary. It may not cover all possible information. If you have questions about this medicine, talk to your doctor, pharmacist, or health care provider.  2020 Elsevier/Gold Standard (2017-08-29 11:10:55)  

## 2020-07-09 DIAGNOSIS — H401111 Primary open-angle glaucoma, right eye, mild stage: Secondary | ICD-10-CM | POA: Diagnosis not present

## 2020-07-09 DIAGNOSIS — H401122 Primary open-angle glaucoma, left eye, moderate stage: Secondary | ICD-10-CM | POA: Diagnosis not present

## 2020-07-09 DIAGNOSIS — H524 Presbyopia: Secondary | ICD-10-CM | POA: Diagnosis not present

## 2020-07-09 DIAGNOSIS — H5213 Myopia, bilateral: Secondary | ICD-10-CM | POA: Diagnosis not present

## 2020-07-09 DIAGNOSIS — H52223 Regular astigmatism, bilateral: Secondary | ICD-10-CM | POA: Diagnosis not present

## 2020-07-11 ENCOUNTER — Other Ambulatory Visit: Payer: Self-pay

## 2020-07-11 ENCOUNTER — Inpatient Hospital Stay: Payer: PPO

## 2020-07-11 VITALS — BP 138/64 | HR 63 | Resp 16

## 2020-07-11 DIAGNOSIS — C9110 Chronic lymphocytic leukemia of B-cell type not having achieved remission: Secondary | ICD-10-CM

## 2020-07-11 DIAGNOSIS — I629 Nontraumatic intracranial hemorrhage, unspecified: Secondary | ICD-10-CM

## 2020-07-11 DIAGNOSIS — D693 Immune thrombocytopenic purpura: Secondary | ICD-10-CM | POA: Diagnosis not present

## 2020-07-11 LAB — CBC WITH DIFFERENTIAL/PLATELET
Abs Immature Granulocytes: 0.02 10*3/uL (ref 0.00–0.07)
Basophils Absolute: 0 10*3/uL (ref 0.0–0.1)
Basophils Relative: 0 %
Eosinophils Absolute: 0 10*3/uL (ref 0.0–0.5)
Eosinophils Relative: 0 %
HCT: 41.9 % (ref 36.0–46.0)
Hemoglobin: 14.1 g/dL (ref 12.0–15.0)
Immature Granulocytes: 0 %
Lymphocytes Relative: 52 %
Lymphs Abs: 2.7 10*3/uL (ref 0.7–4.0)
MCH: 30.9 pg (ref 26.0–34.0)
MCHC: 33.7 g/dL (ref 30.0–36.0)
MCV: 91.9 fL (ref 80.0–100.0)
Monocytes Absolute: 0.6 10*3/uL (ref 0.1–1.0)
Monocytes Relative: 12 %
Neutro Abs: 1.9 10*3/uL (ref 1.7–7.7)
Neutrophils Relative %: 36 %
Platelets: 161 10*3/uL (ref 150–400)
RBC: 4.56 MIL/uL (ref 3.87–5.11)
RDW: 12.6 % (ref 11.5–15.5)
WBC: 5.3 10*3/uL (ref 4.0–10.5)
nRBC: 0 % (ref 0.0–0.2)

## 2020-07-11 MED ORDER — ROMIPLOSTIM 125 MCG ~~LOC~~ SOLR
95.0000 ug | SUBCUTANEOUS | Status: DC
  Administered 2020-07-11: 95 ug via SUBCUTANEOUS
  Filled 2020-07-11: qty 0.19

## 2020-07-11 NOTE — Patient Instructions (Signed)
Romiplostim injection What is this medicine? ROMIPLOSTIM (roe mi PLOE stim) helps your body make more platelets. This medicine is used to treat low platelets caused by chronic idiopathic thrombocytopenic purpura (ITP). This medicine may be used for other purposes; ask your health care provider or pharmacist if you have questions. COMMON BRAND NAME(S): Nplate What should I tell my health care provider before I take this medicine? They need to know if you have any of these conditions:  bleeding disorders  bone marrow problem, like blood cancer or myelodysplastic syndrome  history of blood clots  liver disease  surgery to remove your spleen  an unusual or allergic reaction to romiplostim, mannitol, other medicines, foods, dyes, or preservatives  pregnant or trying to get pregnant  breast-feeding How should I use this medicine? This medicine is for injection under the skin. It is given by a health care professional in a hospital or clinic setting. A special MedGuide will be given to you before your injection. Read this information carefully each time. Talk to your pediatrician regarding the use of this medicine in children. While this drug may be prescribed for children as young as 1 year for selected conditions, precautions do apply. Overdosage: If you think you have taken too much of this medicine contact a poison control center or emergency room at once. NOTE: This medicine is only for you. Do not share this medicine with others. What if I miss a dose? It is important not to miss your dose. Call your doctor or health care professional if you are unable to keep an appointment. What may interact with this medicine? Interactions are not expected. This list may not describe all possible interactions. Give your health care provider a list of all the medicines, herbs, non-prescription drugs, or dietary supplements you use. Also tell them if you smoke, drink alcohol, or use illegal drugs.  Some items may interact with your medicine. What should I watch for while using this medicine? Your condition will be monitored carefully while you are receiving this medicine. Visit your prescriber or health care professional for regular checks on your progress and for the needed blood tests. It is important to keep all appointments. What side effects may I notice from receiving this medicine? Side effects that you should report to your doctor or health care professional as soon as possible:  allergic reactions like skin rash, itching or hives, swelling of the face, lips, or tongue  signs and symptoms of bleeding such as bloody or black, tarry stools; red or dark brown urine; spitting up blood or brown material that looks like coffee grounds; red spots on the skin; unusual bruising or bleeding from the eyes, gums, or nose  signs and symptoms of a blood clot such as chest pain; shortness of breath; pain, swelling, or warmth in the leg  signs and symptoms of a stroke like changes in vision; confusion; trouble speaking or understanding; severe headaches; sudden numbness or weakness of the face, arm or leg; trouble walking; dizziness; loss of balance or coordination Side effects that usually do not require medical attention (report to your doctor or health care professional if they continue or are bothersome):  headache  pain in arms and legs  pain in mouth  stomach pain This list may not describe all possible side effects. Call your doctor for medical advice about side effects. You may report side effects to FDA at 1-800-FDA-1088. Where should I keep my medicine? This drug is given in a hospital or clinic   and will not be stored at home. NOTE: This sheet is a summary. It may not cover all possible information. If you have questions about this medicine, talk to your doctor, pharmacist, or health care provider.  2020 Elsevier/Gold Standard (2017-08-29 11:10:55)  

## 2020-07-17 DIAGNOSIS — E782 Mixed hyperlipidemia: Secondary | ICD-10-CM | POA: Diagnosis not present

## 2020-07-17 DIAGNOSIS — H409 Unspecified glaucoma: Secondary | ICD-10-CM | POA: Diagnosis not present

## 2020-07-17 DIAGNOSIS — I618 Other nontraumatic intracerebral hemorrhage: Secondary | ICD-10-CM | POA: Diagnosis not present

## 2020-07-17 DIAGNOSIS — M810 Age-related osteoporosis without current pathological fracture: Secondary | ICD-10-CM | POA: Diagnosis not present

## 2020-07-17 DIAGNOSIS — I10 Essential (primary) hypertension: Secondary | ICD-10-CM | POA: Diagnosis not present

## 2020-07-18 ENCOUNTER — Inpatient Hospital Stay: Payer: PPO | Attending: Hematology

## 2020-07-18 ENCOUNTER — Inpatient Hospital Stay: Payer: PPO | Admitting: Hematology

## 2020-07-18 ENCOUNTER — Other Ambulatory Visit: Payer: Self-pay

## 2020-07-18 ENCOUNTER — Inpatient Hospital Stay: Payer: PPO

## 2020-07-18 VITALS — BP 161/65 | HR 70 | Temp 96.9°F | Resp 18 | Ht <= 58 in | Wt 105.4 lb

## 2020-07-18 DIAGNOSIS — Z801 Family history of malignant neoplasm of trachea, bronchus and lung: Secondary | ICD-10-CM | POA: Diagnosis not present

## 2020-07-18 DIAGNOSIS — C9110 Chronic lymphocytic leukemia of B-cell type not having achieved remission: Secondary | ICD-10-CM

## 2020-07-18 DIAGNOSIS — D693 Immune thrombocytopenic purpura: Secondary | ICD-10-CM | POA: Diagnosis not present

## 2020-07-18 DIAGNOSIS — I10 Essential (primary) hypertension: Secondary | ICD-10-CM | POA: Insufficient documentation

## 2020-07-18 DIAGNOSIS — I629 Nontraumatic intracranial hemorrhage, unspecified: Secondary | ICD-10-CM

## 2020-07-18 DIAGNOSIS — Z79899 Other long term (current) drug therapy: Secondary | ICD-10-CM | POA: Diagnosis not present

## 2020-07-18 LAB — CBC WITH DIFFERENTIAL/PLATELET
Abs Immature Granulocytes: 0.01 10*3/uL (ref 0.00–0.07)
Basophils Absolute: 0 10*3/uL (ref 0.0–0.1)
Basophils Relative: 0 %
Eosinophils Absolute: 0 10*3/uL (ref 0.0–0.5)
Eosinophils Relative: 0 %
HCT: 42.6 % (ref 36.0–46.0)
Hemoglobin: 14.8 g/dL (ref 12.0–15.0)
Immature Granulocytes: 0 %
Lymphocytes Relative: 48 %
Lymphs Abs: 2.9 10*3/uL (ref 0.7–4.0)
MCH: 31.3 pg (ref 26.0–34.0)
MCHC: 34.7 g/dL (ref 30.0–36.0)
MCV: 90.1 fL (ref 80.0–100.0)
Monocytes Absolute: 0.8 10*3/uL (ref 0.1–1.0)
Monocytes Relative: 13 %
Neutro Abs: 2.4 10*3/uL (ref 1.7–7.7)
Neutrophils Relative %: 39 %
Platelets: 132 10*3/uL — ABNORMAL LOW (ref 150–400)
RBC: 4.73 MIL/uL (ref 3.87–5.11)
RDW: 12.7 % (ref 11.5–15.5)
WBC: 6 10*3/uL (ref 4.0–10.5)
nRBC: 0 % (ref 0.0–0.2)

## 2020-07-18 MED ORDER — ROMIPLOSTIM 125 MCG ~~LOC~~ SOLR
1.0000 ug/kg | SUBCUTANEOUS | Status: DC
  Administered 2020-07-18: 50 ug via SUBCUTANEOUS
  Filled 2020-07-18: qty 0.1

## 2020-07-18 NOTE — Progress Notes (Signed)
HEMATOLOGY/ONCOLOGY CLINIC NOTE  Date of Service: 07/18/2020  Patient Care Team: [name], MD as PCP - General (Family Medicine) [name], MD as Consulting Physician (Hematology)  CHIEF COMPLAINTS/PURPOSE OF CONSULTATION:  F/u for CLL and ITP  HISTORY OF PRESENTING ILLNESS:   Selena Branch is a wonderful 79 y.o. female who has been transferred to [place] from Dr. Mathis Dad [place] for evaluation and management of CLL. The pt reports that she is doing well overall.  The pt reports that has been slightly fatigued in the past 1-2 months, but thinks it is from the heat. The fatigue does not affect her ability to function. Denies fevers, chills, and night sweats. She reports that she saw her PCP in August, who noticed that her blood counts were high and referred her to Dr. Irene Limbo.  Lab results today (05/31/2019) of CBC w diff and CMP is as follows: all values are WNL except for WBC at 39.1k, neutro abs at 1.6k, lymphs abs at 36.8k, monocytes abs at 0.0k, eosinophils abs at 0.8k, BUN at 27, Total protein at 6.4 05/31/2019 LDH is pending  On review of systems, pt reports mild fatigue and denies belly pain, infection issues and any other symptoms.   INTERVAL HISTORY: Selena Branch is a 79 y.o. 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 Pt is joined today by her husband, Marcello Moores. The patient's last visit with [place] was on 05/23/2020. The pt reports that she is doing well overall.  The pt reports that she has been feeling less fatigued since the weather has cooled off. Pt has continued taking 200 mg Venetoclax daily and is tolerating it well. Her blood pressure has been stable in the interim. She has received her COVID19 booster.   Lab results today (07/18/20) of CBC w/diff and CMP is as follows: all values are WNL except for PLT at 132K.  On review of systems, pt denies fatigue, fevers, chills, night sweats, headaches, diarrhea, abdominal pain and any other symptoms.   MEDICAL  HISTORY:  Past Medical History:  Diagnosis Date  . Anxiety   . 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 Glaucoma   . Hyperlipemia   . Hypertension   . Osteoporosis     SURGICAL HISTORY: No past surgical history on file.  SOCIAL HISTORY: Social History   Socioeconomic History  . Marital status: Married    Spouse name: Not on file  . Number of children: 1  . Years of education: HS  . Highest education level: Not on file  Occupational History  . Occupation: Retired  Tobacco Use  . Smoking status: Never Smoker  . Smokeless tobacco: Never Used  Substance and Sexual Activity  . Alcohol use: No    Alcohol/week: 0.0 standard drinks  . Drug use: No  . Sexual activity: Not on file  Other Topics Concern  . Not on file  Social History Narrative   Drinks 2 cups of coffee   Social Determinants of Health   Financial Resource Strain:   . Difficulty of Paying Living Expenses: Not on file  Food Insecurity:   . Worried About Charity fundraiser in the Last Year: Not on file  . Ran Out of Food in the Last Year: Not on file  Transportation Needs:   . Lack of Transportation (Medical): Not on file  . Lack of Transportation (Non-Medical): Not on file  Physical Activity:   . Days of Exercise per Week: Not on file  .  Minutes of Exercise per Session: Not on file  Stress:   . Feeling of Stress : Not on file  Social Connections:   . Frequency of Communication with Friends and Family: Not on file  . Frequency of Social Gatherings with Friends and Family: Not on file  . Attends Religious Services: Not on file  . Active Member of Clubs or Organizations: Not on file  . Attends Archivist Meetings: Not on file  . Marital Status: Not on file  Intimate Partner Violence:   . Fear of Current or Ex-Partner: Not on file  . Emotionally Abused: Not on file  . Physically Abused: Not on file  . Sexually Abused: Not on file    FAMILY HISTORY: Family History  Problem  Relation Age of Onset  . Stroke Mother   . Lung cancer Father   . Leukemia Neg Hx     ALLERGIES:  is allergic to codeine.  MEDICATIONS:  Current Outpatient Medications  Medication Sig Dispense Refill  . amLODipine (NORVASC) 10 MG tablet Take 1 tablet (10 mg total) by mouth at bedtime. 30 tablet 0  . atorvastatin (LIPITOR) 40 MG tablet Take 40 mg by mouth daily.     . Brinzolamide-Brimonidine (SIMBRINZA) 1-0.2 % SUSP Place 1 drop into both eyes daily at 12 noon.    . Calcium Carbonate-Vitamin D (CALCIUM-D PO) Take 1 tablet by mouth daily.    . Cholecalciferol (VITAMIN D3) 2000 units TABS Take 2,000 Units by mouth daily.     . dorzolamide-timolol (COSOPT) 22.3-6.8 MG/ML ophthalmic solution Place 1 drop into both eyes daily.     . furosemide (LASIX) 20 MG tablet Take 40 mg for 3 days then resume your 20 mg daily (Patient taking differently: Take 20 mg by mouth daily. ) 30 tablet   . latanoprost (XALATAN) 0.005 % ophthalmic solution Place 1 drop into both eyes at bedtime.     . Multiple Vitamin (MULTIVITAMIN WITH MINERALS) TABS tablet Take 1 tablet by mouth daily.    . quinapril (ACCUPRIL) 40 MG tablet Take 40 mg by mouth daily.     . VENCLEXTA 100 MG TABS TAKE 2 TABLETS (200 MG) BY MOUTH DAILY. 60 tablet 2   No current facility-administered medications for this visit.    REVIEW OF SYSTEMS:   A 10+ POINT REVIEW OF SYSTEMS WAS OBTAINED including neurology, dermatology, psychiatry, cardiac, respiratory, lymph, extremities, GI, GU, Musculoskeletal, constitutional, breasts, reproductive, HEENT.  All pertinent positives are noted in the HPI.  All others are negative.   PHYSICAL EXAMINATION:  ECOG FS:2 - Symptomatic, <50% confined to bed  Vitals:   07/18/20 1131  BP: (!) 161/65  Pulse: 70  Resp: 18  Temp: (!) 96.9 F (36.1 C)  SpO2: 100%   Wt Readings from Last 3 Encounters:  07/18/20 105 lb 6.4 oz (47.8 kg)  05/23/20 105 lb 1.6 oz (47.7 kg)  04/18/20 103 lb 4.8 oz (46.9 kg)    Body mass index is 22.03 kg/m.    GENERAL:alert, in no acute distress and comfortable SKIN: no acute rashes, no significant lesions EYES: conjunctiva are pink and non-injected, sclera anicteric OROPHARYNX: MMM, no exudates, no oropharyngeal erythema or ulceration NECK: supple, no JVD LYMPH:  no palpable lymphadenopathy in the cervical, axillary or inguinal regions LUNGS: clear to auscultation b/l with normal respiratory effort HEART: regular rate & rhythm ABDOMEN:  normoactive bowel sounds , non tender, not distended. No palpable hepatosplenomegaly.  Extremity: no pedal edema PSYCH: alert & oriented x 3  with fluent speech NEURO: no focal motor/sensory deficits  LABORATORY DATA:  I have reviewed the data as listed  . CBC Latest Ref Rng & Units 07/18/2020 07/11/2020 07/04/2020  WBC 4.0 - 10.5 K/uL 6.0 5.3 5.5  Hemoglobin 12.0 - 15.0 g/dL 14.8 14.1 14.0  Hematocrit 36 - 46 % 42.6 41.9 41.8  Platelets 150 - 400 K/uL 132(L) 161 132(L)    . CMP Latest Ref Rng & Units 06/27/2020 06/06/2020 05/16/2020  Glucose 70 - 99 mg/dL 92 97 101(H)  BUN 8 - 23 mg/dL 18 17 17   Creatinine 0.44 - 1.00 mg/dL 0.82 0.83 0.79  Sodium 135 - 145 mmol/L 143 143 143  Potassium 3.5 - 5.1 mmol/L 4.6 5.4(H) 4.9  Chloride 98 - 111 mmol/L 107 105 109  CO2 22 - 32 mmol/L 32 32 25  Calcium 8.9 - 10.3 mg/dL 10.2 10.1 9.5  Total Protein 6.5 - 8.1 g/dL 6.7 6.6 6.2(L)  Total Bilirubin 0.3 - 1.2 mg/dL 0.6 0.7 0.5  Alkaline Phos 38 - 126 U/L 102 97 93  AST 15 - 41 U/L 22 22 19   ALT 0 - 44 U/L 14 15 9    02/27/2019 FISH:    RADIOGRAPHIC STUDIES: I have personally reviewed the radiological images as listed and agreed with the findings in the report. No results found.  ASSESSMENT & PLAN:   #1 CLL Previous CLL prognostic FISH panel without detectable mutations. Has been Rai stage 0.. Now with new anemia and severe thrombocytopenia. WBC counts have increased significantly to 84k from 39k 3 months ago.   Concerning for change in tempo of her disease. -08/31/2019 CT C/A/P ([id number]) ([id number]) revealed "1. Mild multistation lymphadenopathy in the retroperitoneum and bilateral pelvis as detailed, compatible with lymphoma. Normal size spleen. 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  However cannot rule out bone marrow involvement as an etiology of thrombocytopenia as well.  #3 new mild normocytic anemia.  Hemoglobin 11.7 with an MCV of 97.4. --- now resolved hgb today 14.8 Likely related to her CLL. 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  #4 h/o intraparenchymal bleed- rpt CT head stable (due to uncontrolled HTN and thrombocytopenia)  PLAN: -Discussed pt labwork today, 07/18/20; no anemia, PLT are holding well, other blood counts are nml. -No lab or clinical evidence of CLL progression at this time.  -The pt has no prohibitive toxicities from continuing 200 mg Venetoclax daily at this time.  -Continue weekly N-Plate. Will cut down Nplate from 2 mcg/kg to 1 mcg/kg in an effort to wean the pt off.  -Will see back in 4 weeks with labs  FOLLOW UP: Plz continue weekly labs and Nplate x 8 weeks MD visit in 4 weeks +/- 1 week   The total time spent in the appt was 20 minutes and more than 50% was on counseling and direct patient cares.  All of the patient's questions were answered with apparent satisfaction. The patient knows to call the clinic with any problems, questions or concerns.   Sullivan Lone MD [place] AAHIVMS [facility] Hematology/Oncology Physician [facility]  (Office):       [phone] (Work cell):  [phone] (Fax):           [phone]  07/18/2020 11:52 AM  I, Yevette Edwards, am acting as a scribe for Dr. Sullivan Lone.   Marland Kitchen  I have reviewed the above documentation for accuracy and  completeness, and I agree with the above. [name] MD

## 2020-07-25 ENCOUNTER — Inpatient Hospital Stay: Payer: PPO

## 2020-07-25 ENCOUNTER — Other Ambulatory Visit: Payer: Self-pay

## 2020-07-25 VITALS — BP 152/64 | HR 64 | Resp 18

## 2020-07-25 DIAGNOSIS — D693 Immune thrombocytopenic purpura: Secondary | ICD-10-CM

## 2020-07-25 DIAGNOSIS — C9110 Chronic lymphocytic leukemia of B-cell type not having achieved remission: Secondary | ICD-10-CM

## 2020-07-25 DIAGNOSIS — I629 Nontraumatic intracranial hemorrhage, unspecified: Secondary | ICD-10-CM

## 2020-07-25 LAB — CMP (CANCER CENTER ONLY)
ALT: 12 U/L (ref 0–44)
AST: 20 U/L (ref 15–41)
Albumin: 4.1 g/dL (ref 3.5–5.0)
Alkaline Phosphatase: 97 U/L (ref 38–126)
Anion gap: 7 (ref 5–15)
BUN: 17 mg/dL (ref 8–23)
CO2: 29 mmol/L (ref 22–32)
Calcium: 9.6 mg/dL (ref 8.9–10.3)
Chloride: 107 mmol/L (ref 98–111)
Creatinine: 0.86 mg/dL (ref 0.44–1.00)
GFR, Estimated: >60 mL/min (ref >60)
Glucose, Bld: 91 mg/dL (ref 70–99)
Potassium: 4.3 mmol/L (ref 3.5–5.1)
Sodium: 143 mmol/L (ref 135–145)
Total Bilirubin: 0.8 mg/dL (ref 0.3–1.2)
Total Protein: 6.6 g/dL (ref 6.5–8.1)

## 2020-07-25 LAB — CBC WITH DIFFERENTIAL/PLATELET
Abs Immature Granulocytes: 0.01 10*3/uL (ref 0.00–0.07)
Basophils Absolute: 0 10*3/uL (ref 0.0–0.1)
Basophils Relative: 0 %
Eosinophils Absolute: 0 10*3/uL (ref 0.0–0.5)
Eosinophils Relative: 0 %
HCT: 44.2 % (ref 36.0–46.0)
Hemoglobin: 15 g/dL (ref 12.0–15.0)
Immature Granulocytes: 0 %
Lymphocytes Relative: 41 %
Lymphs Abs: 2.5 10*3/uL (ref 0.7–4.0)
MCH: 31 pg (ref 26.0–34.0)
MCHC: 33.9 g/dL (ref 30.0–36.0)
MCV: 91.3 fL (ref 80.0–100.0)
Monocytes Absolute: 0.8 10*3/uL (ref 0.1–1.0)
Monocytes Relative: 13 %
Neutro Abs: 2.7 10*3/uL (ref 1.7–7.7)
Neutrophils Relative %: 46 %
Platelets: 87 10*3/uL — ABNORMAL LOW (ref 150–400)
RBC: 4.84 MIL/uL (ref 3.87–5.11)
RDW: 12.6 % (ref 11.5–15.5)
WBC: 6 10*3/uL (ref 4.0–10.5)
nRBC: 0 % (ref 0.0–0.2)

## 2020-07-25 MED ORDER — ROMIPLOSTIM 125 MCG ~~LOC~~ SOLR
1.0000 ug/kg | SUBCUTANEOUS | Status: DC
  Administered 2020-07-25: 50 ug via SUBCUTANEOUS
  Filled 2020-07-25: qty 0.1

## 2020-07-25 NOTE — Patient Instructions (Signed)
Romiplostim injection What is this medicine? ROMIPLOSTIM (roe mi PLOE stim) helps your body make more platelets. This medicine is used to treat low platelets caused by chronic idiopathic thrombocytopenic purpura (ITP). This medicine may be used for other purposes; ask your health care provider or pharmacist if you have questions. COMMON BRAND NAME(S): Nplate What should I tell my health care provider before I take this medicine? They need to know if you have any of these conditions:  bleeding disorders  bone marrow problem, like blood cancer or myelodysplastic syndrome  history of blood clots  liver disease  surgery to remove your spleen  an unusual or allergic reaction to romiplostim, mannitol, other medicines, foods, dyes, or preservatives  pregnant or trying to get pregnant  breast-feeding How should I use this medicine? This medicine is for injection under the skin. It is given by a health care professional in a hospital or clinic setting. A special MedGuide will be given to you before your injection. Read this information carefully each time. Talk to your pediatrician regarding the use of this medicine in children. While this drug may be prescribed for children as young as 1 year for selected conditions, precautions do apply. Overdosage: If you think you have taken too much of this medicine contact a poison control center or emergency room at once. NOTE: This medicine is only for you. Do not share this medicine with others. What if I miss a dose? It is important not to miss your dose. Call your doctor or health care professional if you are unable to keep an appointment. What may interact with this medicine? Interactions are not expected. This list may not describe all possible interactions. Give your health care provider a list of all the medicines, herbs, non-prescription drugs, or dietary supplements you use. Also tell them if you smoke, drink alcohol, or use illegal drugs.  Some items may interact with your medicine. What should I watch for while using this medicine? Your condition will be monitored carefully while you are receiving this medicine. Visit your prescriber or health care professional for regular checks on your progress and for the needed blood tests. It is important to keep all appointments. What side effects may I notice from receiving this medicine? Side effects that you should report to your doctor or health care professional as soon as possible:  allergic reactions like skin rash, itching or hives, swelling of the face, lips, or tongue  signs and symptoms of bleeding such as bloody or black, tarry stools; red or dark brown urine; spitting up blood or brown material that looks like coffee grounds; red spots on the skin; unusual bruising or bleeding from the eyes, gums, or nose  signs and symptoms of a blood clot such as chest pain; shortness of breath; pain, swelling, or warmth in the leg  signs and symptoms of a stroke like changes in vision; confusion; trouble speaking or understanding; severe headaches; sudden numbness or weakness of the face, arm or leg; trouble walking; dizziness; loss of balance or coordination Side effects that usually do not require medical attention (report to your doctor or health care professional if they continue or are bothersome):  headache  pain in arms and legs  pain in mouth  stomach pain This list may not describe all possible side effects. Call your doctor for medical advice about side effects. You may report side effects to FDA at 1-800-FDA-1088. Where should I keep my medicine? This drug is given in a hospital or clinic   and will not be stored at home. NOTE: This sheet is a summary. It may not cover all possible information. If you have questions about this medicine, talk to your doctor, pharmacist, or health care provider.  2020 Elsevier/Gold Standard (2017-08-29 11:10:55)  

## 2020-07-31 ENCOUNTER — Other Ambulatory Visit: Payer: Self-pay | Admitting: *Deleted

## 2020-07-31 DIAGNOSIS — D693 Immune thrombocytopenic purpura: Secondary | ICD-10-CM

## 2020-07-31 DIAGNOSIS — C9110 Chronic lymphocytic leukemia of B-cell type not having achieved remission: Secondary | ICD-10-CM

## 2020-08-01 ENCOUNTER — Other Ambulatory Visit: Payer: Self-pay

## 2020-08-01 ENCOUNTER — Inpatient Hospital Stay: Payer: PPO

## 2020-08-01 VITALS — BP 170/65 | HR 66 | Resp 16

## 2020-08-01 DIAGNOSIS — D693 Immune thrombocytopenic purpura: Secondary | ICD-10-CM

## 2020-08-01 DIAGNOSIS — C9110 Chronic lymphocytic leukemia of B-cell type not having achieved remission: Secondary | ICD-10-CM

## 2020-08-01 DIAGNOSIS — I629 Nontraumatic intracranial hemorrhage, unspecified: Secondary | ICD-10-CM

## 2020-08-01 LAB — CBC WITH DIFFERENTIAL (CANCER CENTER ONLY)
Abs Immature Granulocytes: 0.01 10*3/uL (ref 0.00–0.07)
Basophils Absolute: 0 10*3/uL (ref 0.0–0.1)
Basophils Relative: 0 %
Eosinophils Absolute: 0 10*3/uL (ref 0.0–0.5)
Eosinophils Relative: 0 %
HCT: 42.4 % (ref 36.0–46.0)
Hemoglobin: 14.6 g/dL (ref 12.0–15.0)
Immature Granulocytes: 0 %
Lymphocytes Relative: 46 %
Lymphs Abs: 2.4 10*3/uL (ref 0.7–4.0)
MCH: 31.5 pg (ref 26.0–34.0)
MCHC: 34.4 g/dL (ref 30.0–36.0)
MCV: 91.6 fL (ref 80.0–100.0)
Monocytes Absolute: 0.8 10*3/uL (ref 0.1–1.0)
Monocytes Relative: 15 %
Neutro Abs: 2 10*3/uL (ref 1.7–7.7)
Neutrophils Relative %: 39 %
Platelet Count: 55 10*3/uL — ABNORMAL LOW (ref 150–400)
RBC: 4.63 MIL/uL (ref 3.87–5.11)
RDW: 12.6 % (ref 11.5–15.5)
WBC Count: 5.2 10*3/uL (ref 4.0–10.5)
nRBC: 0 % (ref 0.0–0.2)

## 2020-08-01 MED ORDER — ROMIPLOSTIM 125 MCG ~~LOC~~ SOLR
1.0000 ug/kg | SUBCUTANEOUS | Status: DC
  Administered 2020-08-01: 50 ug via SUBCUTANEOUS
  Filled 2020-08-01: qty 0.1

## 2020-08-01 MED FILL — VENCLEXTA 100 MG TABS: 100 | 30 days supply | Qty: 60 | Fill #2

## 2020-08-08 ENCOUNTER — Ambulatory Visit: Payer: PPO

## 2020-08-08 ENCOUNTER — Other Ambulatory Visit: Payer: PPO

## 2020-08-11 ENCOUNTER — Other Ambulatory Visit: Payer: Self-pay | Admitting: *Deleted

## 2020-08-11 ENCOUNTER — Inpatient Hospital Stay: Payer: PPO

## 2020-08-11 ENCOUNTER — Other Ambulatory Visit: Payer: Self-pay

## 2020-08-11 VITALS — BP 171/81 | HR 71 | Temp 98.2°F | Resp 18

## 2020-08-11 DIAGNOSIS — D693 Immune thrombocytopenic purpura: Secondary | ICD-10-CM

## 2020-08-11 DIAGNOSIS — C9110 Chronic lymphocytic leukemia of B-cell type not having achieved remission: Secondary | ICD-10-CM

## 2020-08-11 DIAGNOSIS — I629 Nontraumatic intracranial hemorrhage, unspecified: Secondary | ICD-10-CM

## 2020-08-11 LAB — CBC WITH DIFFERENTIAL (CANCER CENTER ONLY)
Abs Immature Granulocytes: 0.01 10*3/uL (ref 0.00–0.07)
Basophils Absolute: 0 10*3/uL (ref 0.0–0.1)
Basophils Relative: 0 %
Eosinophils Absolute: 0 10*3/uL (ref 0.0–0.5)
Eosinophils Relative: 0 %
HCT: 43.5 % (ref 36.0–46.0)
Hemoglobin: 14.8 g/dL (ref 12.0–15.0)
Immature Granulocytes: 0 %
Lymphocytes Relative: 45 %
Lymphs Abs: 2.5 10*3/uL (ref 0.7–4.0)
MCH: 30.8 pg (ref 26.0–34.0)
MCHC: 34 g/dL (ref 30.0–36.0)
MCV: 90.6 fL (ref 80.0–100.0)
Monocytes Absolute: 0.7 10*3/uL (ref 0.1–1.0)
Monocytes Relative: 12 %
Neutro Abs: 2.4 10*3/uL (ref 1.7–7.7)
Neutrophils Relative %: 43 %
Platelet Count: 114 10*3/uL — ABNORMAL LOW (ref 150–400)
RBC: 4.8 MIL/uL (ref 3.87–5.11)
RDW: 12.7 % (ref 11.5–15.5)
WBC Count: 5.7 10*3/uL (ref 4.0–10.5)
nRBC: 0 % (ref 0.0–0.2)

## 2020-08-11 LAB — CMP (CANCER CENTER ONLY)
ALT: 13 U/L (ref 0–44)
AST: 20 U/L (ref 15–41)
Albumin: 4 g/dL (ref 3.5–5.0)
Alkaline Phosphatase: 108 U/L (ref 38–126)
Anion gap: 12 (ref 5–15)
BUN: 14 mg/dL (ref 8–23)
CO2: 26 mmol/L (ref 22–32)
Calcium: 10.1 mg/dL (ref 8.9–10.3)
Chloride: 108 mmol/L (ref 98–111)
Creatinine: 0.82 mg/dL (ref 0.44–1.00)
GFR, Estimated: >60 mL/min (ref >60)
Glucose, Bld: 103 mg/dL — ABNORMAL HIGH (ref 70–99)
Potassium: 4.7 mmol/L (ref 3.5–5.1)
Sodium: 146 mmol/L — ABNORMAL HIGH (ref 135–145)
Total Bilirubin: 0.5 mg/dL (ref 0.3–1.2)
Total Protein: 6.6 g/dL (ref 6.5–8.1)

## 2020-08-11 MED ORDER — ROMIPLOSTIM 125 MCG ~~LOC~~ SOLR
50.0000 ug | SUBCUTANEOUS | Status: DC
  Administered 2020-08-11: 50 ug via SUBCUTANEOUS
  Filled 2020-08-11: qty 0.1

## 2020-08-11 NOTE — Patient Instructions (Signed)
Romiplostim injection What is this medicine? ROMIPLOSTIM (roe mi PLOE stim) helps your body make more platelets. This medicine is used to treat low platelets caused by chronic idiopathic thrombocytopenic purpura (ITP). This medicine may be used for other purposes; ask your health care provider or pharmacist if you have questions. COMMON BRAND NAME(S): Nplate What should I tell my health care provider before I take this medicine? They need to know if you have any of these conditions:  bleeding disorders  bone marrow problem, like blood cancer or myelodysplastic syndrome  history of blood clots  liver disease  surgery to remove your spleen  an unusual or allergic reaction to romiplostim, mannitol, other medicines, foods, dyes, or preservatives  pregnant or trying to get pregnant  breast-feeding How should I use this medicine? This medicine is for injection under the skin. It is given by a health care professional in a hospital or clinic setting. A special MedGuide will be given to you before your injection. Read this information carefully each time. Talk to your pediatrician regarding the use of this medicine in children. While this drug may be prescribed for children as young as 1 year for selected conditions, precautions do apply. Overdosage: If you think you have taken too much of this medicine contact a poison control center or emergency room at once. NOTE: This medicine is only for you. Do not share this medicine with others. What if I miss a dose? It is important not to miss your dose. Call your doctor or health care professional if you are unable to keep an appointment. What may interact with this medicine? Interactions are not expected. This list may not describe all possible interactions. Give your health care provider a list of all the medicines, herbs, non-prescription drugs, or dietary supplements you use. Also tell them if you smoke, drink alcohol, or use illegal drugs.  Some items may interact with your medicine. What should I watch for while using this medicine? Your condition will be monitored carefully while you are receiving this medicine. Visit your prescriber or health care professional for regular checks on your progress and for the needed blood tests. It is important to keep all appointments. What side effects may I notice from receiving this medicine? Side effects that you should report to your doctor or health care professional as soon as possible:  allergic reactions like skin rash, itching or hives, swelling of the face, lips, or tongue  signs and symptoms of bleeding such as bloody or black, tarry stools; red or dark brown urine; spitting up blood or brown material that looks like coffee grounds; red spots on the skin; unusual bruising or bleeding from the eyes, gums, or nose  signs and symptoms of a blood clot such as chest pain; shortness of breath; pain, swelling, or warmth in the leg  signs and symptoms of a stroke like changes in vision; confusion; trouble speaking or understanding; severe headaches; sudden numbness or weakness of the face, arm or leg; trouble walking; dizziness; loss of balance or coordination Side effects that usually do not require medical attention (report to your doctor or health care professional if they continue or are bothersome):  headache  pain in arms and legs  pain in mouth  stomach pain This list may not describe all possible side effects. Call your doctor for medical advice about side effects. You may report side effects to FDA at 1-800-FDA-1088. Where should I keep my medicine? This drug is given in a hospital or clinic   and will not be stored at home. NOTE: This sheet is a summary. It may not cover all possible information. If you have questions about this medicine, talk to your doctor, pharmacist, or health care provider.  2020 Elsevier/Gold Standard (2017-08-29 11:10:55)  

## 2020-08-18 ENCOUNTER — Inpatient Hospital Stay: Payer: PPO | Admitting: Hematology

## 2020-08-18 ENCOUNTER — Other Ambulatory Visit: Payer: Self-pay

## 2020-08-18 ENCOUNTER — Inpatient Hospital Stay: Payer: PPO | Attending: Hematology

## 2020-08-18 ENCOUNTER — Inpatient Hospital Stay: Payer: PPO

## 2020-08-18 VITALS — BP 167/57 | HR 63 | Temp 97.1°F | Resp 18 | Ht <= 58 in | Wt 105.5 lb

## 2020-08-18 DIAGNOSIS — Z801 Family history of malignant neoplasm of trachea, bronchus and lung: Secondary | ICD-10-CM | POA: Diagnosis not present

## 2020-08-18 DIAGNOSIS — I629 Nontraumatic intracranial hemorrhage, unspecified: Secondary | ICD-10-CM

## 2020-08-18 DIAGNOSIS — Z79899 Other long term (current) drug therapy: Secondary | ICD-10-CM | POA: Insufficient documentation

## 2020-08-18 DIAGNOSIS — I10 Essential (primary) hypertension: Secondary | ICD-10-CM | POA: Diagnosis not present

## 2020-08-18 DIAGNOSIS — C9110 Chronic lymphocytic leukemia of B-cell type not having achieved remission: Secondary | ICD-10-CM | POA: Diagnosis not present

## 2020-08-18 DIAGNOSIS — D693 Immune thrombocytopenic purpura: Secondary | ICD-10-CM

## 2020-08-18 LAB — CMP (CANCER CENTER ONLY)
ALT: 13 U/L (ref 0–44)
AST: 21 U/L (ref 15–41)
Albumin: 4 g/dL (ref 3.5–5.0)
Alkaline Phosphatase: 95 U/L (ref 38–126)
Anion gap: 7 (ref 5–15)
BUN: 16 mg/dL (ref 8–23)
CO2: 31 mmol/L (ref 22–32)
Calcium: 10.1 mg/dL (ref 8.9–10.3)
Chloride: 106 mmol/L (ref 98–111)
Creatinine: 0.81 mg/dL (ref 0.44–1.00)
GFR, Estimated: >60 mL/min (ref >60)
Glucose, Bld: 85 mg/dL (ref 70–99)
Potassium: 4.4 mmol/L (ref 3.5–5.1)
Sodium: 144 mmol/L (ref 135–145)
Total Bilirubin: 0.8 mg/dL (ref 0.3–1.2)
Total Protein: 6.5 g/dL (ref 6.5–8.1)

## 2020-08-18 LAB — CBC WITH DIFFERENTIAL/PLATELET
Abs Immature Granulocytes: 0.01 10*3/uL (ref 0.00–0.07)
Basophils Absolute: 0 10*3/uL (ref 0.0–0.1)
Basophils Relative: 0 %
Eosinophils Absolute: 0 10*3/uL (ref 0.0–0.5)
Eosinophils Relative: 0 %
HCT: 42 % (ref 36.0–46.0)
Hemoglobin: 14.3 g/dL (ref 12.0–15.0)
Immature Granulocytes: 0 %
Lymphocytes Relative: 49 %
Lymphs Abs: 2.6 10*3/uL (ref 0.7–4.0)
MCH: 31.2 pg (ref 26.0–34.0)
MCHC: 34 g/dL (ref 30.0–36.0)
MCV: 91.7 fL (ref 80.0–100.0)
Monocytes Absolute: 0.7 10*3/uL (ref 0.1–1.0)
Monocytes Relative: 14 %
Neutro Abs: 2 10*3/uL (ref 1.7–7.7)
Neutrophils Relative %: 37 %
Platelets: 69 10*3/uL — ABNORMAL LOW (ref 150–400)
RBC: 4.58 MIL/uL (ref 3.87–5.11)
RDW: 12.8 % (ref 11.5–15.5)
WBC: 5.3 10*3/uL (ref 4.0–10.5)
nRBC: 0 % (ref 0.0–0.2)

## 2020-08-18 MED ORDER — ROMIPLOSTIM 125 MCG ~~LOC~~ SOLR
1.0000 ug/kg | SUBCUTANEOUS | Status: DC
  Filled 2020-08-18: qty 0.1

## 2020-08-18 MED ORDER — ROMIPLOSTIM 125 MCG ~~LOC~~ SOLR
100.0000 ug | SUBCUTANEOUS | Status: DC
  Administered 2020-08-18: 100 ug via SUBCUTANEOUS
  Filled 2020-08-18: qty 0.2

## 2020-08-18 NOTE — Patient Instructions (Signed)
Romiplostim injection What is this medicine? ROMIPLOSTIM (roe mi PLOE stim) helps your body make more platelets. This medicine is used to treat low platelets caused by chronic idiopathic thrombocytopenic purpura (ITP). This medicine may be used for other purposes; ask your health care provider or pharmacist if you have questions. COMMON BRAND NAME(S): Nplate What should I tell my health care provider before I take this medicine? They need to know if you have any of these conditions:  bleeding disorders  bone marrow problem, like blood cancer or myelodysplastic syndrome  history of blood clots  liver disease  surgery to remove your spleen  an unusual or allergic reaction to romiplostim, mannitol, other medicines, foods, dyes, or preservatives  pregnant or trying to get pregnant  breast-feeding How should I use this medicine? This medicine is for injection under the skin. It is given by a health care professional in a hospital or clinic setting. A special MedGuide will be given to you before your injection. Read this information carefully each time. Talk to your pediatrician regarding the use of this medicine in children. While this drug may be prescribed for children as young as 1 year for selected conditions, precautions do apply. Overdosage: If you think you have taken too much of this medicine contact a poison control center or emergency room at once. NOTE: This medicine is only for you. Do not share this medicine with others. What if I miss a dose? It is important not to miss your dose. Call your doctor or health care professional if you are unable to keep an appointment. What may interact with this medicine? Interactions are not expected. This list may not describe all possible interactions. Give your health care provider a list of all the medicines, herbs, non-prescription drugs, or dietary supplements you use. Also tell them if you smoke, drink alcohol, or use illegal drugs.  Some items may interact with your medicine. What should I watch for while using this medicine? Your condition will be monitored carefully while you are receiving this medicine. Visit your prescriber or health care professional for regular checks on your progress and for the needed blood tests. It is important to keep all appointments. What side effects may I notice from receiving this medicine? Side effects that you should report to your doctor or health care professional as soon as possible:  allergic reactions like skin rash, itching or hives, swelling of the face, lips, or tongue  signs and symptoms of bleeding such as bloody or black, tarry stools; red or dark brown urine; spitting up blood or brown material that looks like coffee grounds; red spots on the skin; unusual bruising or bleeding from the eyes, gums, or nose  signs and symptoms of a blood clot such as chest pain; shortness of breath; pain, swelling, or warmth in the leg  signs and symptoms of a stroke like changes in vision; confusion; trouble speaking or understanding; severe headaches; sudden numbness or weakness of the face, arm or leg; trouble walking; dizziness; loss of balance or coordination Side effects that usually do not require medical attention (report to your doctor or health care professional if they continue or are bothersome):  headache  pain in arms and legs  pain in mouth  stomach pain This list may not describe all possible side effects. Call your doctor for medical advice about side effects. You may report side effects to FDA at 1-800-FDA-1088. Where should I keep my medicine? This drug is given in a hospital or clinic   and will not be stored at home. NOTE: This sheet is a summary. It may not cover all possible information. If you have questions about this medicine, talk to your doctor, pharmacist, or health care provider.  2020 Elsevier/Gold Standard (2017-08-29 11:10:55)  

## 2020-08-18 NOTE — Progress Notes (Signed)
Dr. Irene Limbo would like patient to receive Nplate 52mcg/kg today. Patient is changing to Friday appts and will not return until 08/29/20. Orders changed as requested.  Hardie Pulley, PharmD, BCPS, BCOP

## 2020-08-18 NOTE — Progress Notes (Signed)
HEMATOLOGY/ONCOLOGY CLINIC NOTE  Date of Service: 08/18/2020  Patient Care Team: [name], MD as PCP - General (Family Medicine) [name], MD as Consulting Physician (Hematology)  CHIEF COMPLAINTS/PURPOSE OF CONSULTATION:  F/u for CLL and ITP  HISTORY OF PRESENTING ILLNESS:   Selena Branch is a wonderful 79 y.o. female who has been transferred to [place] from Dr. Mathis Dad [place] for evaluation and management of CLL. The pt reports that she is doing well overall.  The pt reports that has been slightly fatigued in the past 1-2 months, but thinks it is from the heat. The fatigue does not affect her ability to function. Denies fevers, chills, and night sweats. She reports that she saw her PCP in August, who noticed that her blood counts were high and referred her to Dr. Irene Limbo.  Lab results today (05/31/2019) of CBC w diff and CMP is as follows: all values are WNL except for WBC at 39.1k, neutro abs at 1.6k, lymphs abs at 36.8k, monocytes abs at 0.0k, eosinophils abs at 0.8k, BUN at 27, Total protein at 6.4 05/31/2019 LDH is pending  On review of systems, pt reports mild fatigue and denies belly pain, infection issues and any other symptoms.   INTERVAL HISTORY:  Selena Branch is a 79 y.o. 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 Pt is joined today by her husband, Marcello Moores. The patient's last visit with [place] was on 07/18/2020. The pt reports that she is doing well overall.  The pt reports that she has a small rash on her right ankle. Pt has felt well otherwise and denies any symptoms of infection.   Lab results (08/18/20) of CBC w/diff and CMP is as follows: all values are WNL except for PLT at 69K.  On review of systems, pt reports rash and denies fevers, cough, chills and any other symptoms.   MEDICAL HISTORY:  Past Medical History:  Diagnosis Date  . Anxiety   . 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 Glaucoma   . Hyperlipemia   . Hypertension   .  Osteoporosis     SURGICAL HISTORY: No past surgical history on file.  SOCIAL HISTORY: Social History   Socioeconomic History  . Marital status: Married    Spouse name: Not on file  . Number of children: 1  . Years of education: HS  . Highest education level: Not on file  Occupational History  . Occupation: Retired  Tobacco Use  . Smoking status: Never Smoker  . Smokeless tobacco: Never Used  Substance and Sexual Activity  . Alcohol use: No    Alcohol/week: 0.0 standard drinks  . Drug use: No  . Sexual activity: Not on file  Other Topics Concern  . Not on file  Social History Narrative   Drinks 2 cups of coffee   Social Determinants of Health   Financial Resource Strain:   . Difficulty of Paying Living Expenses: Not on file  Food Insecurity:   . Worried About Charity fundraiser in the Last Year: Not on file  . Ran Out of Food in the Last Year: Not on file  Transportation Needs:   . Lack of Transportation (Medical): Not on file  . Lack of Transportation (Non-Medical): Not on file  Physical Activity:   . Days of Exercise per Week: Not on file  . Minutes of Exercise per Session: Not on file  Stress:   . Feeling of Stress : Not on file  Social Connections:   .  Frequency of Communication with Friends and Family: Not on file  . Frequency of Social Gatherings with Friends and Family: Not on file  . Attends Religious Services: Not on file  . Active Member of Clubs or Organizations: Not on file  . Attends Archivist Meetings: Not on file  . Marital Status: Not on file  Intimate Partner Violence:   . Fear of Current or Ex-Partner: Not on file  . Emotionally Abused: Not on file  . Physically Abused: Not on file  . Sexually Abused: Not on file    FAMILY HISTORY: Family History  Problem Relation Age of Onset  . Stroke Mother   . Lung cancer Father   . Leukemia Neg Hx     ALLERGIES:  is allergic to codeine.  MEDICATIONS:  Current Outpatient  Medications  Medication Sig Dispense Refill  . amLODipine (NORVASC) 10 MG tablet Take 1 tablet (10 mg total) by mouth at bedtime. 30 tablet 0  . atorvastatin (LIPITOR) 40 MG tablet Take 40 mg by mouth daily.     . Brinzolamide-Brimonidine (SIMBRINZA) 1-0.2 % SUSP Place 1 drop into both eyes daily at 12 noon.    . Calcium Carbonate-Vitamin D (CALCIUM-D PO) Take 1 tablet by mouth daily.    . Cholecalciferol (VITAMIN D3) 2000 units TABS Take 2,000 Units by mouth daily.     . dorzolamide-timolol (COSOPT) 22.3-6.8 MG/ML ophthalmic solution Place 1 drop into both eyes daily.     . furosemide (LASIX) 20 MG tablet Take 40 mg for 3 days then resume your 20 mg daily (Patient taking differently: Take 20 mg by mouth daily. ) 30 tablet   . latanoprost (XALATAN) 0.005 % ophthalmic solution Place 1 drop into both eyes at bedtime.     . Multiple Vitamin (MULTIVITAMIN WITH MINERALS) TABS tablet Take 1 tablet by mouth daily.    . quinapril (ACCUPRIL) 40 MG tablet Take 40 mg by mouth daily.     . VENCLEXTA 100 MG TABS TAKE 2 TABLETS (200 MG) BY MOUTH DAILY. 60 tablet 2   No current facility-administered medications for this visit.   Facility-Administered Medications Ordered in Other Visits  Medication Dose Route Frequency Provider Last Rate Last Admin  . romiPLOStim (NPLATE) injection 100 mcg  100 mcg Subcutaneous Weekly Irene Limbo, Cloria Spring, MD        REVIEW OF SYSTEMS:   A 10+ POINT REVIEW OF SYSTEMS WAS OBTAINED including neurology, dermatology, psychiatry, cardiac, respiratory, lymph, extremities, GI, GU, Musculoskeletal, constitutional, breasts, reproductive, HEENT.  All pertinent positives are noted in the HPI.  All others are negative.   PHYSICAL EXAMINATION:  ECOG FS:2 - Symptomatic, <50% confined to bed  Vitals:   08/18/20 0955  BP: (!) 167/57  Pulse: 63  Resp: 18  Temp: (!) 97.1 F (36.2 C)  SpO2: 98%   Wt Readings from Last 3 Encounters:  08/18/20 105 lb 8 oz (47.9 kg)  07/18/20 105 lb  6.4 oz (47.8 kg)  05/23/20 105 lb 1.6 oz (47.7 kg)   Body mass index is 22.05 kg/m.    GENERAL:alert, in no acute distress and comfortable SKIN: no acute rashes, no significant lesions EYES: conjunctiva are pink and non-injected, sclera anicteric OROPHARYNX: MMM, no exudates, no oropharyngeal erythema or ulceration NECK: supple, no JVD LYMPH:  no palpable lymphadenopathy in the cervical, axillary or inguinal regions LUNGS: clear to auscultation b/l with normal respiratory effort HEART: regular rate & rhythm ABDOMEN:  normoactive bowel sounds , non tender, not distended. No palpable  hepatosplenomegaly.  Extremity: no pedal edema PSYCH: alert & oriented x 3 with fluent speech NEURO: no focal motor/sensory deficits  LABORATORY DATA:  I have reviewed the data as listed  . CBC Latest Ref Rng & Units 08/18/2020 08/11/2020 08/01/2020  WBC 4.0 - 10.5 K/uL 5.3 5.7 5.2  Hemoglobin 12.0 - 15.0 g/dL 14.3 14.8 14.6  Hematocrit 36 - 46 % 42.0 43.5 42.4  Platelets 150 - 400 K/uL 69(L) 114(L) 55(L)    . CMP Latest Ref Rng & Units 08/18/2020 08/11/2020 07/25/2020  Glucose 70 - 99 mg/dL 85 103(H) 91  BUN 8 - 23 mg/dL 16 14 17   Creatinine 0.44 - 1.00 mg/dL 0.81 0.82 0.86  Sodium 135 - 145 mmol/L 144 146(H) 143  Potassium 3.5 - 5.1 mmol/L 4.4 4.7 4.3  Chloride 98 - 111 mmol/L 106 108 107  CO2 22 - 32 mmol/L 31 26 29   Calcium 8.9 - 10.3 mg/dL 10.1 10.1 9.6  Total Protein 6.5 - 8.1 g/dL 6.5 6.6 6.6  Total Bilirubin 0.3 - 1.2 mg/dL 0.8 0.5 0.8  Alkaline Phos 38 - 126 U/L 95 108 97  AST 15 - 41 U/L 21 20 20   ALT 0 - 44 U/L 13 13 12    02/27/2019 FISH:    RADIOGRAPHIC STUDIES: I have personally reviewed the radiological images as listed and agreed with the findings in the report. No results found.  ASSESSMENT & PLAN:   #1 CLL Previous CLL prognostic FISH panel without detectable mutations. Has been Rai stage 0.. Now with new anemia and severe thrombocytopenia. WBC counts have increased  significantly to 84k from 39k 3 months ago.  Concerning for change in tempo of her disease. -08/31/2019 CT C/A/P ([id number]) ([id number]) revealed "1. Mild multistation lymphadenopathy in the retroperitoneum and bilateral pelvis as detailed, compatible with lymphoma. Normal size spleen. 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  However cannot rule out bone marrow involvement as an etiology of thrombocytopenia as well.  #3 new mild normocytic anemia.  Hemoglobin 11.7 with an MCV of 97.4. --- now resolved hgb today 14.8 Likely related to her CLL. 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  #4 h/o intraparenchymal bleed- rpt CT head stable (due to uncontrolled HTN and thrombocytopenia)  PLAN: -Discussed pt labwork today, 08/18/20; PLT are low once again, other blood counts and chemistries are nml. -The pt has no prohibitive toxicities from continuing 200 mg Venetoclax daily at this time. -No lab or clinical evidence of CLL progression at this time.  -Advised pt that her PLT do not appear to be holding without Nplate.  -Advised pt that Promacta would be less burdensome from a treatment standpoint. . -Discussed continuing Nplate weekly vs beginning Promacta vs starting immunotherapy - pt prefers to continue Nplate. -Continue weekly N-Plate with labs -Will see back in 3 months with labs   FOLLOW UP: Continue weekly labs and Nplate x 4 months RTC with Dr Irene Limbo with labs in 3 months   The total time spent in the appt was 20 minutes and more than 50% was on counseling and direct patient cares.  All of the patient's questions were answered with apparent satisfaction. The patient knows to call the clinic with any problems, questions or concerns.   Sullivan Lone MD [place] AAHIVMS [facility] Hematology/Oncology Physician [place]  (Office):  [phone] (Work cell):  [phone] (Fax):           [phone]  08/18/2020 11:38 AM  I, Yevette Edwards, am acting as a scribe for Dr. Sullivan Lone.   .I have reviewed the above documentation for accuracy and completeness, and I agree with the above. [name] MD

## 2020-08-18 NOTE — Progress Notes (Signed)
Verbal order per Dr. Irene Limbo - contact pharmacy to change today's Nplate dose to 13mcg/kg. Next dose will be 12/17. Order given to [employer]

## 2020-08-25 ENCOUNTER — Other Ambulatory Visit: Payer: Self-pay | Admitting: Hematology

## 2020-08-25 ENCOUNTER — Ambulatory Visit: Payer: PPO

## 2020-08-25 ENCOUNTER — Other Ambulatory Visit: Payer: PPO

## 2020-08-29 ENCOUNTER — Inpatient Hospital Stay: Payer: PPO

## 2020-08-29 ENCOUNTER — Other Ambulatory Visit: Payer: Self-pay

## 2020-08-29 VITALS — BP 157/71 | HR 64 | Temp 98.0°F | Resp 18

## 2020-08-29 DIAGNOSIS — D693 Immune thrombocytopenic purpura: Secondary | ICD-10-CM

## 2020-08-29 DIAGNOSIS — C9110 Chronic lymphocytic leukemia of B-cell type not having achieved remission: Secondary | ICD-10-CM

## 2020-08-29 DIAGNOSIS — I629 Nontraumatic intracranial hemorrhage, unspecified: Secondary | ICD-10-CM

## 2020-08-29 LAB — CMP (CANCER CENTER ONLY)
ALT: 14 U/L (ref 0–44)
AST: 19 U/L (ref 15–41)
Albumin: 4 g/dL (ref 3.5–5.0)
Alkaline Phosphatase: 98 U/L (ref 38–126)
Anion gap: 5 (ref 5–15)
BUN: 19 mg/dL (ref 8–23)
CO2: 31 mmol/L (ref 22–32)
Calcium: 10.1 mg/dL (ref 8.9–10.3)
Chloride: 105 mmol/L (ref 98–111)
Creatinine: 0.79 mg/dL (ref 0.44–1.00)
GFR, Estimated: >60 mL/min (ref >60)
Glucose, Bld: 85 mg/dL (ref 70–99)
Potassium: 5.2 mmol/L — ABNORMAL HIGH (ref 3.5–5.1)
Sodium: 141 mmol/L (ref 135–145)
Total Bilirubin: 0.7 mg/dL (ref 0.3–1.2)
Total Protein: 6.5 g/dL (ref 6.5–8.1)

## 2020-08-29 LAB — CBC WITH DIFFERENTIAL/PLATELET
Abs Immature Granulocytes: 0.01 10*3/uL (ref 0.00–0.07)
Basophils Absolute: 0 10*3/uL (ref 0.0–0.1)
Basophils Relative: 0 %
Eosinophils Absolute: 0.1 10*3/uL (ref 0.0–0.5)
Eosinophils Relative: 2 %
HCT: 41.3 % (ref 36.0–46.0)
Hemoglobin: 14.2 g/dL (ref 12.0–15.0)
Immature Granulocytes: 0 %
Lymphocytes Relative: 44 %
Lymphs Abs: 2.7 10*3/uL (ref 0.7–4.0)
MCH: 31.3 pg (ref 26.0–34.0)
MCHC: 34.4 g/dL (ref 30.0–36.0)
MCV: 91 fL (ref 80.0–100.0)
Monocytes Absolute: 0.8 10*3/uL (ref 0.1–1.0)
Monocytes Relative: 14 %
Neutro Abs: 2.4 10*3/uL (ref 1.7–7.7)
Neutrophils Relative %: 40 %
Platelets: 125 10*3/uL — ABNORMAL LOW (ref 150–400)
RBC: 4.54 MIL/uL (ref 3.87–5.11)
RDW: 12.7 % (ref 11.5–15.5)
WBC: 5.9 10*3/uL (ref 4.0–10.5)
nRBC: 0 % (ref 0.0–0.2)

## 2020-08-29 MED ORDER — ROMIPLOSTIM 125 MCG ~~LOC~~ SOLR
100.0000 ug | SUBCUTANEOUS | Status: DC
  Administered 2020-08-29: 100 ug via SUBCUTANEOUS
  Filled 2020-08-29: qty 0.2

## 2020-08-29 MED FILL — VENCLEXTA 100 MG TABS: 100 | 30 days supply | Qty: 60 | Fill #0

## 2020-08-29 NOTE — Patient Instructions (Signed)
Romiplostim injection What is this medicine? ROMIPLOSTIM (roe mi PLOE stim) helps your body make more platelets. This medicine is used to treat low platelets caused by chronic idiopathic thrombocytopenic purpura (ITP). This medicine may be used for other purposes; ask your health care provider or pharmacist if you have questions. COMMON BRAND NAME(S): Nplate What should I tell my health care provider before I take this medicine? They need to know if you have any of these conditions:  bleeding disorders  bone marrow problem, like blood cancer or myelodysplastic syndrome  history of blood clots  liver disease  surgery to remove your spleen  an unusual or allergic reaction to romiplostim, mannitol, other medicines, foods, dyes, or preservatives  pregnant or trying to get pregnant  breast-feeding How should I use this medicine? This medicine is for injection under the skin. It is given by a health care professional in a hospital or clinic setting. A special MedGuide will be given to you before your injection. Read this information carefully each time. Talk to your pediatrician regarding the use of this medicine in children. While this drug may be prescribed for children as young as 1 year for selected conditions, precautions do apply. Overdosage: If you think you have taken too much of this medicine contact a poison control center or emergency room at once. NOTE: This medicine is only for you. Do not share this medicine with others. What if I miss a dose? It is important not to miss your dose. Call your doctor or health care professional if you are unable to keep an appointment. What may interact with this medicine? Interactions are not expected. This list may not describe all possible interactions. Give your health care provider a list of all the medicines, herbs, non-prescription drugs, or dietary supplements you use. Also tell them if you smoke, drink alcohol, or use illegal drugs.  Some items may interact with your medicine. What should I watch for while using this medicine? Your condition will be monitored carefully while you are receiving this medicine. Visit your prescriber or health care professional for regular checks on your progress and for the needed blood tests. It is important to keep all appointments. What side effects may I notice from receiving this medicine? Side effects that you should report to your doctor or health care professional as soon as possible:  allergic reactions like skin rash, itching or hives, swelling of the face, lips, or tongue  signs and symptoms of bleeding such as bloody or black, tarry stools; red or dark brown urine; spitting up blood or brown material that looks like coffee grounds; red spots on the skin; unusual bruising or bleeding from the eyes, gums, or nose  signs and symptoms of a blood clot such as chest pain; shortness of breath; pain, swelling, or warmth in the leg  signs and symptoms of a stroke like changes in vision; confusion; trouble speaking or understanding; severe headaches; sudden numbness or weakness of the face, arm or leg; trouble walking; dizziness; loss of balance or coordination Side effects that usually do not require medical attention (report to your doctor or health care professional if they continue or are bothersome):  headache  pain in arms and legs  pain in mouth  stomach pain This list may not describe all possible side effects. Call your doctor for medical advice about side effects. You may report side effects to FDA at 1-800-FDA-1088. Where should I keep my medicine? This drug is given in a hospital or clinic   and will not be stored at home. NOTE: This sheet is a summary. It may not cover all possible information. If you have questions about this medicine, talk to your doctor, pharmacist, or health care provider.  2020 Elsevier/Gold Standard (2017-08-29 11:10:55)  

## 2020-09-01 ENCOUNTER — Other Ambulatory Visit: Payer: PPO

## 2020-09-01 ENCOUNTER — Ambulatory Visit: Payer: PPO

## 2020-09-04 ENCOUNTER — Other Ambulatory Visit: Payer: Self-pay

## 2020-09-04 ENCOUNTER — Inpatient Hospital Stay: Payer: PPO

## 2020-09-04 VITALS — BP 166/70 | HR 66 | Resp 18

## 2020-09-04 DIAGNOSIS — D693 Immune thrombocytopenic purpura: Secondary | ICD-10-CM | POA: Diagnosis not present

## 2020-09-04 DIAGNOSIS — C9110 Chronic lymphocytic leukemia of B-cell type not having achieved remission: Secondary | ICD-10-CM

## 2020-09-04 DIAGNOSIS — I629 Nontraumatic intracranial hemorrhage, unspecified: Secondary | ICD-10-CM

## 2020-09-04 LAB — CBC WITH DIFFERENTIAL/PLATELET
Abs Immature Granulocytes: 0.01 10*3/uL (ref 0.00–0.07)
Basophils Absolute: 0 10*3/uL (ref 0.0–0.1)
Basophils Relative: 0 %
Eosinophils Absolute: 0 10*3/uL (ref 0.0–0.5)
Eosinophils Relative: 0 %
HCT: 41.7 % (ref 36.0–46.0)
Hemoglobin: 14.5 g/dL (ref 12.0–15.0)
Immature Granulocytes: 0 %
Lymphocytes Relative: 43 %
Lymphs Abs: 2.5 10*3/uL (ref 0.7–4.0)
MCH: 32 pg (ref 26.0–34.0)
MCHC: 34.8 g/dL (ref 30.0–36.0)
MCV: 92.1 fL (ref 80.0–100.0)
Monocytes Absolute: 0.7 10*3/uL (ref 0.1–1.0)
Monocytes Relative: 12 %
Neutro Abs: 2.7 10*3/uL (ref 1.7–7.7)
Neutrophils Relative %: 45 %
Platelets: 135 10*3/uL — ABNORMAL LOW (ref 150–400)
RBC: 4.53 MIL/uL (ref 3.87–5.11)
RDW: 12.6 % (ref 11.5–15.5)
WBC: 5.9 10*3/uL (ref 4.0–10.5)
nRBC: 0 % (ref 0.0–0.2)

## 2020-09-04 MED ORDER — ROMIPLOSTIM 125 MCG ~~LOC~~ SOLR
100.0000 ug | SUBCUTANEOUS | Status: DC
  Administered 2020-09-04: 100 ug via SUBCUTANEOUS
  Filled 2020-09-04: qty 0.2

## 2020-09-08 ENCOUNTER — Other Ambulatory Visit: Payer: PPO

## 2020-09-08 ENCOUNTER — Ambulatory Visit: Payer: PPO

## 2020-09-11 ENCOUNTER — Other Ambulatory Visit: Payer: Self-pay

## 2020-09-11 ENCOUNTER — Inpatient Hospital Stay: Payer: PPO

## 2020-09-11 VITALS — BP 149/61 | HR 60 | Temp 98.1°F | Resp 17 | Ht <= 58 in

## 2020-09-11 DIAGNOSIS — C9110 Chronic lymphocytic leukemia of B-cell type not having achieved remission: Secondary | ICD-10-CM

## 2020-09-11 DIAGNOSIS — I629 Nontraumatic intracranial hemorrhage, unspecified: Secondary | ICD-10-CM

## 2020-09-11 DIAGNOSIS — D693 Immune thrombocytopenic purpura: Secondary | ICD-10-CM

## 2020-09-11 LAB — CBC WITH DIFFERENTIAL/PLATELET
Abs Immature Granulocytes: 0 10*3/uL (ref 0.00–0.07)
Basophils Absolute: 0 10*3/uL (ref 0.0–0.1)
Basophils Relative: 0 %
Eosinophils Absolute: 0 10*3/uL (ref 0.0–0.5)
Eosinophils Relative: 0 %
HCT: 41 % (ref 36.0–46.0)
Hemoglobin: 13.9 g/dL (ref 12.0–15.0)
Immature Granulocytes: 0 %
Lymphocytes Relative: 50 %
Lymphs Abs: 2.8 10*3/uL (ref 0.7–4.0)
MCH: 31.6 pg (ref 26.0–34.0)
MCHC: 33.9 g/dL (ref 30.0–36.0)
MCV: 93.2 fL (ref 80.0–100.0)
Monocytes Absolute: 0.8 10*3/uL (ref 0.1–1.0)
Monocytes Relative: 13 %
Neutro Abs: 2.1 10*3/uL (ref 1.7–7.7)
Neutrophils Relative %: 37 %
Platelets: 210 10*3/uL (ref 150–400)
RBC: 4.4 MIL/uL (ref 3.87–5.11)
RDW: 12.9 % (ref 11.5–15.5)
WBC: 5.6 10*3/uL (ref 4.0–10.5)
nRBC: 0 % (ref 0.0–0.2)

## 2020-09-11 MED ORDER — ROMIPLOSTIM 125 MCG ~~LOC~~ SOLR
50.0000 ug | SUBCUTANEOUS | Status: DC
  Administered 2020-09-11: 50 ug via SUBCUTANEOUS
  Filled 2020-09-11: qty 0.1

## 2020-09-11 NOTE — Patient Instructions (Signed)
Romiplostim injection What is this medicine? ROMIPLOSTIM (roe mi PLOE stim) helps your body make more platelets. This medicine is used to treat low platelets caused by chronic idiopathic thrombocytopenic purpura (ITP). This medicine may be used for other purposes; ask your health care provider or pharmacist if you have questions. COMMON BRAND NAME(S): Nplate What should I tell my health care provider before I take this medicine? They need to know if you have any of these conditions:  bleeding disorders  bone marrow problem, like blood cancer or myelodysplastic syndrome  history of blood clots  liver disease  surgery to remove your spleen  an unusual or allergic reaction to romiplostim, mannitol, other medicines, foods, dyes, or preservatives  pregnant or trying to get pregnant  breast-feeding How should I use this medicine? This medicine is for injection under the skin. It is given by a health care professional in a hospital or clinic setting. A special MedGuide will be given to you before your injection. Read this information carefully each time. Talk to your pediatrician regarding the use of this medicine in children. While this drug may be prescribed for children as young as 1 year for selected conditions, precautions do apply. Overdosage: If you think you have taken too much of this medicine contact a poison control center or emergency room at once. NOTE: This medicine is only for you. Do not share this medicine with others. What if I miss a dose? It is important not to miss your dose. Call your doctor or health care professional if you are unable to keep an appointment. What may interact with this medicine? Interactions are not expected. This list may not describe all possible interactions. Give your health care provider a list of all the medicines, herbs, non-prescription drugs, or dietary supplements you use. Also tell them if you smoke, drink alcohol, or use illegal drugs.  Some items may interact with your medicine. What should I watch for while using this medicine? Your condition will be monitored carefully while you are receiving this medicine. Visit your prescriber or health care professional for regular checks on your progress and for the needed blood tests. It is important to keep all appointments. What side effects may I notice from receiving this medicine? Side effects that you should report to your doctor or health care professional as soon as possible:  allergic reactions like skin rash, itching or hives, swelling of the face, lips, or tongue  signs and symptoms of bleeding such as bloody or black, tarry stools; red or dark brown urine; spitting up blood or brown material that looks like coffee grounds; red spots on the skin; unusual bruising or bleeding from the eyes, gums, or nose  signs and symptoms of a blood clot such as chest pain; shortness of breath; pain, swelling, or warmth in the leg  signs and symptoms of a stroke like changes in vision; confusion; trouble speaking or understanding; severe headaches; sudden numbness or weakness of the face, arm or leg; trouble walking; dizziness; loss of balance or coordination Side effects that usually do not require medical attention (report to your doctor or health care professional if they continue or are bothersome):  headache  pain in arms and legs  pain in mouth  stomach pain This list may not describe all possible side effects. Call your doctor for medical advice about side effects. You may report side effects to FDA at 1-800-FDA-1088. Where should I keep my medicine? This drug is given in a hospital or clinic   and will not be stored at home. NOTE: This sheet is a summary. It may not cover all possible information. If you have questions about this medicine, talk to your doctor, pharmacist, or health care provider.  2020 Elsevier/Gold Standard (2017-08-29 11:10:55)  

## 2020-09-11 NOTE — Progress Notes (Signed)
Ok to give nplate today per Dr Myna Hidalgo (on call MD)

## 2020-09-17 DIAGNOSIS — H401131 Primary open-angle glaucoma, bilateral, mild stage: Secondary | ICD-10-CM | POA: Diagnosis not present

## 2020-09-17 DIAGNOSIS — H5213 Myopia, bilateral: Secondary | ICD-10-CM | POA: Diagnosis not present

## 2020-09-17 DIAGNOSIS — H524 Presbyopia: Secondary | ICD-10-CM | POA: Diagnosis not present

## 2020-09-17 DIAGNOSIS — H52223 Regular astigmatism, bilateral: Secondary | ICD-10-CM | POA: Diagnosis not present

## 2020-09-19 ENCOUNTER — Inpatient Hospital Stay: Payer: PPO | Attending: Hematology

## 2020-09-19 ENCOUNTER — Inpatient Hospital Stay: Payer: PPO

## 2020-09-19 ENCOUNTER — Other Ambulatory Visit: Payer: Self-pay

## 2020-09-19 VITALS — BP 153/63 | HR 66 | Temp 98.5°F | Resp 18

## 2020-09-19 DIAGNOSIS — D693 Immune thrombocytopenic purpura: Secondary | ICD-10-CM

## 2020-09-19 DIAGNOSIS — Z801 Family history of malignant neoplasm of trachea, bronchus and lung: Secondary | ICD-10-CM | POA: Insufficient documentation

## 2020-09-19 DIAGNOSIS — Z79899 Other long term (current) drug therapy: Secondary | ICD-10-CM | POA: Diagnosis not present

## 2020-09-19 DIAGNOSIS — I10 Essential (primary) hypertension: Secondary | ICD-10-CM | POA: Insufficient documentation

## 2020-09-19 DIAGNOSIS — I629 Nontraumatic intracranial hemorrhage, unspecified: Secondary | ICD-10-CM

## 2020-09-19 DIAGNOSIS — C9110 Chronic lymphocytic leukemia of B-cell type not having achieved remission: Secondary | ICD-10-CM

## 2020-09-19 LAB — CBC WITH DIFFERENTIAL/PLATELET
Abs Immature Granulocytes: 0.01 10*3/uL (ref 0.00–0.07)
Basophils Absolute: 0 10*3/uL (ref 0.0–0.1)
Basophils Relative: 0 %
Eosinophils Absolute: 0 10*3/uL (ref 0.0–0.5)
Eosinophils Relative: 0 %
HCT: 43.8 % (ref 36.0–46.0)
Hemoglobin: 14.8 g/dL (ref 12.0–15.0)
Immature Granulocytes: 0 %
Lymphocytes Relative: 44 %
Lymphs Abs: 2.6 10*3/uL (ref 0.7–4.0)
MCH: 31.7 pg (ref 26.0–34.0)
MCHC: 33.8 g/dL (ref 30.0–36.0)
MCV: 93.8 fL (ref 80.0–100.0)
Monocytes Absolute: 0.8 10*3/uL (ref 0.1–1.0)
Monocytes Relative: 13 %
Neutro Abs: 2.5 10*3/uL (ref 1.7–7.7)
Neutrophils Relative %: 43 %
Platelets: 224 10*3/uL (ref 150–400)
RBC: 4.67 MIL/uL (ref 3.87–5.11)
RDW: 13 % (ref 11.5–15.5)
WBC: 5.9 10*3/uL (ref 4.0–10.5)
nRBC: 0 % (ref 0.0–0.2)

## 2020-09-19 MED ORDER — ROMIPLOSTIM 125 MCG ~~LOC~~ SOLR
50.0000 ug | SUBCUTANEOUS | Status: DC
  Administered 2020-09-19: 50 ug via SUBCUTANEOUS
  Filled 2020-09-19: qty 0.1

## 2020-09-19 NOTE — Patient Instructions (Signed)
Romiplostim injection What is this medicine? ROMIPLOSTIM (roe mi PLOE stim) helps your body make more platelets. This medicine is used to treat low platelets caused by chronic idiopathic thrombocytopenic purpura (ITP). This medicine may be used for other purposes; ask your health care provider or pharmacist if you have questions. COMMON BRAND NAME(S): Nplate What should I tell my health care provider before I take this medicine? They need to know if you have any of these conditions:  bleeding disorders  bone marrow problem, like blood cancer or myelodysplastic syndrome  history of blood clots  liver disease  surgery to remove your spleen  an unusual or allergic reaction to romiplostim, mannitol, other medicines, foods, dyes, or preservatives  pregnant or trying to get pregnant  breast-feeding How should I use this medicine? This medicine is for injection under the skin. It is given by a health care professional in a hospital or clinic setting. A special MedGuide will be given to you before your injection. Read this information carefully each time. Talk to your pediatrician regarding the use of this medicine in children. While this drug may be prescribed for children as young as 1 year for selected conditions, precautions do apply. Overdosage: If you think you have taken too much of this medicine contact a poison control center or emergency room at once. NOTE: This medicine is only for you. Do not share this medicine with others. What if I miss a dose? It is important not to miss your dose. Call your doctor or health care professional if you are unable to keep an appointment. What may interact with this medicine? Interactions are not expected. This list may not describe all possible interactions. Give your health care provider a list of all the medicines, herbs, non-prescription drugs, or dietary supplements you use. Also tell them if you smoke, drink alcohol, or use illegal drugs.  Some items may interact with your medicine. What should I watch for while using this medicine? Your condition will be monitored carefully while you are receiving this medicine. Visit your prescriber or health care professional for regular checks on your progress and for the needed blood tests. It is important to keep all appointments. What side effects may I notice from receiving this medicine? Side effects that you should report to your doctor or health care professional as soon as possible:  allergic reactions like skin rash, itching or hives, swelling of the face, lips, or tongue  signs and symptoms of bleeding such as bloody or black, tarry stools; red or dark brown urine; spitting up blood or brown material that looks like coffee grounds; red spots on the skin; unusual bruising or bleeding from the eyes, gums, or nose  signs and symptoms of a blood clot such as chest pain; shortness of breath; pain, swelling, or warmth in the leg  signs and symptoms of a stroke like changes in vision; confusion; trouble speaking or understanding; severe headaches; sudden numbness or weakness of the face, arm or leg; trouble walking; dizziness; loss of balance or coordination Side effects that usually do not require medical attention (report to your doctor or health care professional if they continue or are bothersome):  headache  pain in arms and legs  pain in mouth  stomach pain This list may not describe all possible side effects. Call your doctor for medical advice about side effects. You may report side effects to FDA at 1-800-FDA-1088. Where should I keep my medicine? This drug is given in a hospital or clinic   and will not be stored at home. NOTE: This sheet is a summary. It may not cover all possible information. If you have questions about this medicine, talk to your doctor, pharmacist, or health care provider.  2020 Elsevier/Gold Standard (2017-08-29 11:10:55)  

## 2020-09-25 MED FILL — VENCLEXTA 100 MG TABS: 100 | 30 days supply | Qty: 60 | Fill #1

## 2020-09-26 ENCOUNTER — Other Ambulatory Visit: Payer: Self-pay

## 2020-09-26 ENCOUNTER — Inpatient Hospital Stay: Payer: PPO

## 2020-09-26 VITALS — BP 163/61 | HR 69 | Temp 97.1°F | Resp 18

## 2020-09-26 DIAGNOSIS — C9110 Chronic lymphocytic leukemia of B-cell type not having achieved remission: Secondary | ICD-10-CM

## 2020-09-26 DIAGNOSIS — D693 Immune thrombocytopenic purpura: Secondary | ICD-10-CM | POA: Diagnosis not present

## 2020-09-26 DIAGNOSIS — I629 Nontraumatic intracranial hemorrhage, unspecified: Secondary | ICD-10-CM

## 2020-09-26 LAB — CBC WITH DIFFERENTIAL/PLATELET
Abs Immature Granulocytes: 0.01 10*3/uL (ref 0.00–0.07)
Basophils Absolute: 0 10*3/uL (ref 0.0–0.1)
Basophils Relative: 1 %
Eosinophils Absolute: 0 10*3/uL (ref 0.0–0.5)
Eosinophils Relative: 0 %
HCT: 42.1 % (ref 36.0–46.0)
Hemoglobin: 14.7 g/dL (ref 12.0–15.0)
Immature Granulocytes: 0 %
Lymphocytes Relative: 42 %
Lymphs Abs: 2.2 10*3/uL (ref 0.7–4.0)
MCH: 32.1 pg (ref 26.0–34.0)
MCHC: 34.9 g/dL (ref 30.0–36.0)
MCV: 91.9 fL (ref 80.0–100.0)
Monocytes Absolute: 0.7 10*3/uL (ref 0.1–1.0)
Monocytes Relative: 14 %
Neutro Abs: 2.3 10*3/uL (ref 1.7–7.7)
Neutrophils Relative %: 43 %
Platelets: 75 10*3/uL — ABNORMAL LOW (ref 150–400)
RBC: 4.58 MIL/uL (ref 3.87–5.11)
RDW: 12.8 % (ref 11.5–15.5)
WBC: 5.3 10*3/uL (ref 4.0–10.5)
nRBC: 0 % (ref 0.0–0.2)

## 2020-09-26 MED ORDER — ROMIPLOSTIM 125 MCG ~~LOC~~ SOLR
100.0000 ug | SUBCUTANEOUS | Status: DC
  Administered 2020-09-26: 100 ug via SUBCUTANEOUS
  Filled 2020-09-26: qty 0.2

## 2020-09-26 NOTE — Patient Instructions (Signed)
Romiplostim injection What is this medicine? ROMIPLOSTIM (roe mi PLOE stim) helps your body make more platelets. This medicine is used to treat low platelets caused by chronic idiopathic thrombocytopenic purpura (ITP) or a bone marrow syndrome caused by radiation sickness. This medicine may be used for other purposes; ask your health care provider or pharmacist if you have questions. COMMON BRAND NAME(S): Nplate What should I tell my health care provider before I take this medicine? They need to know if you have any of these conditions:  blood clots  myelodysplastic syndrome  an unusual or allergic reaction to romiplostim, mannitol, other medicines, foods, dyes, or preservatives  pregnant or trying to get pregnant  breast-feeding How should I use this medicine? This medicine is injected under the skin. It is given by a health care provider in a hospital or clinic setting. A special MedGuide will be given to you before each treatment. Be sure to read this information carefully each time. Talk to your health care provider about the use of this medicine in children. While it may be prescribed for children as young as newborns for selected conditions, precautions do apply. Overdosage: If you think you have taken too much of this medicine contact a poison control center or emergency room at once. NOTE: This medicine is only for you. Do not share this medicine with others. What if I miss a dose? Keep appointments for follow-up doses. It is important not to miss your dose. Call your health care provider if you are unable to keep an appointment. What may interact with this medicine? Interactions are not expected. This list may not describe all possible interactions. Give your health care provider a list of all the medicines, herbs, non-prescription drugs, or dietary supplements you use. Also tell them if you smoke, drink alcohol, or use illegal drugs. Some items may interact with your  medicine. What should I watch for while using this medicine? Visit your health care provider for regular checks on your progress. You may need blood work done while you are taking this medicine. Your condition will be monitored carefully while you are receiving this medicine. It is important not to miss any appointments. What side effects may I notice from receiving this medicine? Side effects that you should report to your doctor or health care professional as soon as possible:  allergic reactions (skin rash, itching or hives; swelling of the face, lips, or tongue)  bleeding (bloody or black, tarry stools; red or dark brown urine; spitting up blood or brown material that looks like coffee grounds; red spots on the skin; unusual bruising or bleeding from the eyes, gums, or nose)  blood clot (chest pain; shortness of breath; pain, swelling, or warmth in the leg)  stroke (changes in vision; confusion; trouble speaking or understanding; severe headaches; sudden numbness or weakness of the face, arm or leg; trouble walking; dizziness; loss of balance or coordination) Side effects that usually do not require medical attention (report to your doctor or health care professional if they continue or are bothersome):  diarrhea  dizziness  headache  joint pain  muscle pain  stomach pain  trouble sleeping This list may not describe all possible side effects. Call your doctor for medical advice about side effects. You may report side effects to FDA at 1-800-FDA-1088. Where should I keep my medicine? This medicine is given in a hospital or clinic. It will not be stored at home. NOTE: This sheet is a summary. It may not cover all possible   information. If you have questions about this medicine, talk to your doctor, pharmacist, or health care provider.  2021 Elsevier/Gold Standard (2019-10-15 10:28:13)  

## 2020-09-26 NOTE — Progress Notes (Signed)
Pltc = 75 today. I d/w Dr. Irene Limbo and Nplate will be dosed at 100 mcg today.  Kennith Center, Pharm.D., CPP 09/26/2020@11 :26 AM

## 2020-10-03 ENCOUNTER — Inpatient Hospital Stay: Payer: PPO

## 2020-10-03 ENCOUNTER — Other Ambulatory Visit: Payer: Self-pay

## 2020-10-03 VITALS — BP 137/61 | HR 72 | Temp 96.8°F | Resp 16

## 2020-10-03 DIAGNOSIS — C9110 Chronic lymphocytic leukemia of B-cell type not having achieved remission: Secondary | ICD-10-CM

## 2020-10-03 DIAGNOSIS — D693 Immune thrombocytopenic purpura: Secondary | ICD-10-CM

## 2020-10-03 DIAGNOSIS — I629 Nontraumatic intracranial hemorrhage, unspecified: Secondary | ICD-10-CM

## 2020-10-03 LAB — CBC WITH DIFFERENTIAL/PLATELET
Abs Immature Granulocytes: 0.01 10*3/uL (ref 0.00–0.07)
Basophils Absolute: 0 10*3/uL (ref 0.0–0.1)
Basophils Relative: 0 %
Eosinophils Absolute: 0 10*3/uL (ref 0.0–0.5)
Eosinophils Relative: 0 %
HCT: 41.8 % (ref 36.0–46.0)
Hemoglobin: 14.3 g/dL (ref 12.0–15.0)
Immature Granulocytes: 0 %
Lymphocytes Relative: 44 %
Lymphs Abs: 2.3 10*3/uL (ref 0.7–4.0)
MCH: 32.1 pg (ref 26.0–34.0)
MCHC: 34.2 g/dL (ref 30.0–36.0)
MCV: 93.9 fL (ref 80.0–100.0)
Monocytes Absolute: 0.7 10*3/uL (ref 0.1–1.0)
Monocytes Relative: 13 %
Neutro Abs: 2.3 10*3/uL (ref 1.7–7.7)
Neutrophils Relative %: 43 %
Platelets: 142 10*3/uL — ABNORMAL LOW (ref 150–400)
RBC: 4.45 MIL/uL (ref 3.87–5.11)
RDW: 12.5 % (ref 11.5–15.5)
WBC: 5.3 10*3/uL (ref 4.0–10.5)
nRBC: 0 % (ref 0.0–0.2)

## 2020-10-03 MED ORDER — ROMIPLOSTIM 125 MCG ~~LOC~~ SOLR
100.0000 ug | SUBCUTANEOUS | Status: DC
  Administered 2020-10-03: 100 ug via SUBCUTANEOUS
  Filled 2020-10-03: qty 0.2

## 2020-10-03 NOTE — Patient Instructions (Signed)
Romiplostim injection What is this medicine? ROMIPLOSTIM (roe mi PLOE stim) helps your body make more platelets. This medicine is used to treat low platelets caused by chronic idiopathic thrombocytopenic purpura (ITP) or a bone marrow syndrome caused by radiation sickness. This medicine may be used for other purposes; ask your health care provider or pharmacist if you have questions. COMMON BRAND NAME(S): Nplate What should I tell my health care provider before I take this medicine? They need to know if you have any of these conditions:  blood clots  myelodysplastic syndrome  an unusual or allergic reaction to romiplostim, mannitol, other medicines, foods, dyes, or preservatives  pregnant or trying to get pregnant  breast-feeding How should I use this medicine? This medicine is injected under the skin. It is given by a health care provider in a hospital or clinic setting. A special MedGuide will be given to you before each treatment. Be sure to read this information carefully each time. Talk to your health care provider about the use of this medicine in children. While it may be prescribed for children as young as newborns for selected conditions, precautions do apply. Overdosage: If you think you have taken too much of this medicine contact a poison control center or emergency room at once. NOTE: This medicine is only for you. Do not share this medicine with others. What if I miss a dose? Keep appointments for follow-up doses. It is important not to miss your dose. Call your health care provider if you are unable to keep an appointment. What may interact with this medicine? Interactions are not expected. This list may not describe all possible interactions. Give your health care provider a list of all the medicines, herbs, non-prescription drugs, or dietary supplements you use. Also tell them if you smoke, drink alcohol, or use illegal drugs. Some items may interact with your  medicine. What should I watch for while using this medicine? Visit your health care provider for regular checks on your progress. You may need blood work done while you are taking this medicine. Your condition will be monitored carefully while you are receiving this medicine. It is important not to miss any appointments. What side effects may I notice from receiving this medicine? Side effects that you should report to your doctor or health care professional as soon as possible:  allergic reactions (skin rash, itching or hives; swelling of the face, lips, or tongue)  bleeding (bloody or black, tarry stools; red or dark brown urine; spitting up blood or brown material that looks like coffee grounds; red spots on the skin; unusual bruising or bleeding from the eyes, gums, or nose)  blood clot (chest pain; shortness of breath; pain, swelling, or warmth in the leg)  stroke (changes in vision; confusion; trouble speaking or understanding; severe headaches; sudden numbness or weakness of the face, arm or leg; trouble walking; dizziness; loss of balance or coordination) Side effects that usually do not require medical attention (report to your doctor or health care professional if they continue or are bothersome):  diarrhea  dizziness  headache  joint pain  muscle pain  stomach pain  trouble sleeping This list may not describe all possible side effects. Call your doctor for medical advice about side effects. You may report side effects to FDA at 1-800-FDA-1088. Where should I keep my medicine? This medicine is given in a hospital or clinic. It will not be stored at home. NOTE: This sheet is a summary. It may not cover all possible   information. If you have questions about this medicine, talk to your doctor, pharmacist, or health care provider.  2021 Elsevier/Gold Standard (2019-10-15 10:28:13)  

## 2020-10-07 DIAGNOSIS — H409 Unspecified glaucoma: Secondary | ICD-10-CM | POA: Diagnosis not present

## 2020-10-07 DIAGNOSIS — I618 Other nontraumatic intracerebral hemorrhage: Secondary | ICD-10-CM | POA: Diagnosis not present

## 2020-10-07 DIAGNOSIS — E782 Mixed hyperlipidemia: Secondary | ICD-10-CM | POA: Diagnosis not present

## 2020-10-07 DIAGNOSIS — I10 Essential (primary) hypertension: Secondary | ICD-10-CM | POA: Diagnosis not present

## 2020-10-07 DIAGNOSIS — M810 Age-related osteoporosis without current pathological fracture: Secondary | ICD-10-CM | POA: Diagnosis not present

## 2020-10-10 ENCOUNTER — Inpatient Hospital Stay: Payer: PPO

## 2020-10-10 ENCOUNTER — Other Ambulatory Visit: Payer: Self-pay

## 2020-10-10 VITALS — BP 151/56 | HR 62 | Temp 97.0°F | Resp 16

## 2020-10-10 DIAGNOSIS — D693 Immune thrombocytopenic purpura: Secondary | ICD-10-CM | POA: Diagnosis not present

## 2020-10-10 DIAGNOSIS — C9110 Chronic lymphocytic leukemia of B-cell type not having achieved remission: Secondary | ICD-10-CM

## 2020-10-10 DIAGNOSIS — I629 Nontraumatic intracranial hemorrhage, unspecified: Secondary | ICD-10-CM

## 2020-10-10 LAB — CBC WITH DIFFERENTIAL/PLATELET
Abs Immature Granulocytes: 0.01 10*3/uL (ref 0.00–0.07)
Basophils Absolute: 0 10*3/uL (ref 0.0–0.1)
Basophils Relative: 0 %
Eosinophils Absolute: 0 10*3/uL (ref 0.0–0.5)
Eosinophils Relative: 0 %
HCT: 44.3 % (ref 36.0–46.0)
Hemoglobin: 14.6 g/dL (ref 12.0–15.0)
Immature Granulocytes: 0 %
Lymphocytes Relative: 35 %
Lymphs Abs: 2.2 10*3/uL (ref 0.7–4.0)
MCH: 31 pg (ref 26.0–34.0)
MCHC: 33 g/dL (ref 30.0–36.0)
MCV: 94.1 fL (ref 80.0–100.0)
Monocytes Absolute: 0.8 10*3/uL (ref 0.1–1.0)
Monocytes Relative: 12 %
Neutro Abs: 3.2 10*3/uL (ref 1.7–7.7)
Neutrophils Relative %: 53 %
Platelets: 183 10*3/uL (ref 150–400)
RBC: 4.71 MIL/uL (ref 3.87–5.11)
RDW: 12.4 % (ref 11.5–15.5)
WBC: 6.1 10*3/uL (ref 4.0–10.5)
nRBC: 0 % (ref 0.0–0.2)

## 2020-10-10 MED ORDER — ROMIPLOSTIM 125 MCG ~~LOC~~ SOLR
100.0000 ug | SUBCUTANEOUS | Status: DC
  Administered 2020-10-10: 100 ug via SUBCUTANEOUS
  Filled 2020-10-10: qty 0.2

## 2020-10-10 NOTE — Patient Instructions (Signed)
Romiplostim injection What is this medicine? ROMIPLOSTIM (roe mi PLOE stim) helps your body make more platelets. This medicine is used to treat low platelets caused by chronic idiopathic thrombocytopenic purpura (ITP) or a bone marrow syndrome caused by radiation sickness. This medicine may be used for other purposes; ask your health care provider or pharmacist if you have questions. COMMON BRAND NAME(S): Nplate What should I tell my health care provider before I take this medicine? They need to know if you have any of these conditions:  blood clots  myelodysplastic syndrome  an unusual or allergic reaction to romiplostim, mannitol, other medicines, foods, dyes, or preservatives  pregnant or trying to get pregnant  breast-feeding How should I use this medicine? This medicine is injected under the skin. It is given by a health care provider in a hospital or clinic setting. A special MedGuide will be given to you before each treatment. Be sure to read this information carefully each time. Talk to your health care provider about the use of this medicine in children. While it may be prescribed for children as young as newborns for selected conditions, precautions do apply. Overdosage: If you think you have taken too much of this medicine contact a poison control center or emergency room at once. NOTE: This medicine is only for you. Do not share this medicine with others. What if I miss a dose? Keep appointments for follow-up doses. It is important not to miss your dose. Call your health care provider if you are unable to keep an appointment. What may interact with this medicine? Interactions are not expected. This list may not describe all possible interactions. Give your health care provider a list of all the medicines, herbs, non-prescription drugs, or dietary supplements you use. Also tell them if you smoke, drink alcohol, or use illegal drugs. Some items may interact with your  medicine. What should I watch for while using this medicine? Visit your health care provider for regular checks on your progress. You may need blood work done while you are taking this medicine. Your condition will be monitored carefully while you are receiving this medicine. It is important not to miss any appointments. What side effects may I notice from receiving this medicine? Side effects that you should report to your doctor or health care professional as soon as possible:  allergic reactions (skin rash, itching or hives; swelling of the face, lips, or tongue)  bleeding (bloody or black, tarry stools; red or dark brown urine; spitting up blood or brown material that looks like coffee grounds; red spots on the skin; unusual bruising or bleeding from the eyes, gums, or nose)  blood clot (chest pain; shortness of breath; pain, swelling, or warmth in the leg)  stroke (changes in vision; confusion; trouble speaking or understanding; severe headaches; sudden numbness or weakness of the face, arm or leg; trouble walking; dizziness; loss of balance or coordination) Side effects that usually do not require medical attention (report to your doctor or health care professional if they continue or are bothersome):  diarrhea  dizziness  headache  joint pain  muscle pain  stomach pain  trouble sleeping This list may not describe all possible side effects. Call your doctor for medical advice about side effects. You may report side effects to FDA at 1-800-FDA-1088. Where should I keep my medicine? This medicine is given in a hospital or clinic. It will not be stored at home. NOTE: This sheet is a summary. It may not cover all possible   information. If you have questions about this medicine, talk to your doctor, pharmacist, or health care provider.  2021 Elsevier/Gold Standard (2019-10-15 10:28:13)  

## 2020-10-10 NOTE — Progress Notes (Signed)
Spoke with Dr. Irene Limbo and he would like to continue 100 mcg dose of nplate today.  Larene Beach, PharmD, RPh

## 2020-10-14 DIAGNOSIS — I10 Essential (primary) hypertension: Secondary | ICD-10-CM | POA: Diagnosis not present

## 2020-10-14 DIAGNOSIS — C9110 Chronic lymphocytic leukemia of B-cell type not having achieved remission: Secondary | ICD-10-CM | POA: Diagnosis not present

## 2020-10-14 DIAGNOSIS — F4320 Adjustment disorder, unspecified: Secondary | ICD-10-CM | POA: Diagnosis not present

## 2020-10-14 DIAGNOSIS — M810 Age-related osteoporosis without current pathological fracture: Secondary | ICD-10-CM | POA: Diagnosis not present

## 2020-10-14 DIAGNOSIS — H409 Unspecified glaucoma: Secondary | ICD-10-CM | POA: Diagnosis not present

## 2020-10-14 DIAGNOSIS — E782 Mixed hyperlipidemia: Secondary | ICD-10-CM | POA: Diagnosis not present

## 2020-10-17 ENCOUNTER — Inpatient Hospital Stay: Payer: PPO | Attending: Hematology

## 2020-10-17 ENCOUNTER — Inpatient Hospital Stay: Payer: PPO

## 2020-10-17 ENCOUNTER — Other Ambulatory Visit: Payer: Self-pay

## 2020-10-17 VITALS — BP 163/60

## 2020-10-17 DIAGNOSIS — C9110 Chronic lymphocytic leukemia of B-cell type not having achieved remission: Secondary | ICD-10-CM | POA: Insufficient documentation

## 2020-10-17 DIAGNOSIS — I629 Nontraumatic intracranial hemorrhage, unspecified: Secondary | ICD-10-CM

## 2020-10-17 DIAGNOSIS — D693 Immune thrombocytopenic purpura: Secondary | ICD-10-CM | POA: Insufficient documentation

## 2020-10-17 DIAGNOSIS — I10 Essential (primary) hypertension: Secondary | ICD-10-CM | POA: Diagnosis not present

## 2020-10-17 DIAGNOSIS — Z801 Family history of malignant neoplasm of trachea, bronchus and lung: Secondary | ICD-10-CM | POA: Insufficient documentation

## 2020-10-17 DIAGNOSIS — Z79899 Other long term (current) drug therapy: Secondary | ICD-10-CM | POA: Insufficient documentation

## 2020-10-17 LAB — CBC WITH DIFFERENTIAL/PLATELET
Abs Immature Granulocytes: 0.01 10*3/uL (ref 0.00–0.07)
Basophils Absolute: 0 10*3/uL (ref 0.0–0.1)
Basophils Relative: 1 %
Eosinophils Absolute: 0 10*3/uL (ref 0.0–0.5)
Eosinophils Relative: 1 %
HCT: 42.9 % (ref 36.0–46.0)
Hemoglobin: 14.8 g/dL (ref 12.0–15.0)
Immature Granulocytes: 0 %
Lymphocytes Relative: 43 %
Lymphs Abs: 2.9 10*3/uL (ref 0.7–4.0)
MCH: 32 pg (ref 26.0–34.0)
MCHC: 34.5 g/dL (ref 30.0–36.0)
MCV: 92.9 fL (ref 80.0–100.0)
Monocytes Absolute: 0.8 10*3/uL (ref 0.1–1.0)
Monocytes Relative: 13 %
Neutro Abs: 2.7 10*3/uL (ref 1.7–7.7)
Neutrophils Relative %: 42 %
Platelets: 173 10*3/uL (ref 150–400)
RBC: 4.62 MIL/uL (ref 3.87–5.11)
RDW: 12.3 % (ref 11.5–15.5)
WBC: 6.4 10*3/uL (ref 4.0–10.5)
nRBC: 0 % (ref 0.0–0.2)

## 2020-10-17 MED ORDER — ROMIPLOSTIM 125 MCG ~~LOC~~ SOLR
100.0000 ug | SUBCUTANEOUS | Status: DC
  Administered 2020-10-17: 100 ug via SUBCUTANEOUS
  Filled 2020-10-17: qty 0.2

## 2020-10-17 NOTE — Patient Instructions (Signed)
Romiplostim injection What is this medicine? ROMIPLOSTIM (roe mi PLOE stim) helps your body make more platelets. This medicine is used to treat low platelets caused by chronic idiopathic thrombocytopenic purpura (ITP) or a bone marrow syndrome caused by radiation sickness. This medicine may be used for other purposes; ask your health care provider or pharmacist if you have questions. COMMON BRAND NAME(S): Nplate What should I tell my health care provider before I take this medicine? They need to know if you have any of these conditions:  blood clots  myelodysplastic syndrome  an unusual or allergic reaction to romiplostim, mannitol, other medicines, foods, dyes, or preservatives  pregnant or trying to get pregnant  breast-feeding How should I use this medicine? This medicine is injected under the skin. It is given by a health care provider in a hospital or clinic setting. A special MedGuide will be given to you before each treatment. Be sure to read this information carefully each time. Talk to your health care provider about the use of this medicine in children. While it may be prescribed for children as young as newborns for selected conditions, precautions do apply. Overdosage: If you think you have taken too much of this medicine contact a poison control center or emergency room at once. NOTE: This medicine is only for you. Do not share this medicine with others. What if I miss a dose? Keep appointments for follow-up doses. It is important not to miss your dose. Call your health care provider if you are unable to keep an appointment. What may interact with this medicine? Interactions are not expected. This list may not describe all possible interactions. Give your health care provider a list of all the medicines, herbs, non-prescription drugs, or dietary supplements you use. Also tell them if you smoke, drink alcohol, or use illegal drugs. Some items may interact with your  medicine. What should I watch for while using this medicine? Visit your health care provider for regular checks on your progress. You may need blood work done while you are taking this medicine. Your condition will be monitored carefully while you are receiving this medicine. It is important not to miss any appointments. What side effects may I notice from receiving this medicine? Side effects that you should report to your doctor or health care professional as soon as possible:  allergic reactions (skin rash, itching or hives; swelling of the face, lips, or tongue)  bleeding (bloody or black, tarry stools; red or dark brown urine; spitting up blood or brown material that looks like coffee grounds; red spots on the skin; unusual bruising or bleeding from the eyes, gums, or nose)  blood clot (chest pain; shortness of breath; pain, swelling, or warmth in the leg)  stroke (changes in vision; confusion; trouble speaking or understanding; severe headaches; sudden numbness or weakness of the face, arm or leg; trouble walking; dizziness; loss of balance or coordination) Side effects that usually do not require medical attention (report to your doctor or health care professional if they continue or are bothersome):  diarrhea  dizziness  headache  joint pain  muscle pain  stomach pain  trouble sleeping This list may not describe all possible side effects. Call your doctor for medical advice about side effects. You may report side effects to FDA at 1-800-FDA-1088. Where should I keep my medicine? This medicine is given in a hospital or clinic. It will not be stored at home. NOTE: This sheet is a summary. It may not cover all possible   information. If you have questions about this medicine, talk to your doctor, pharmacist, or health care provider.  2021 Elsevier/Gold Standard (2019-10-15 10:28:13)  

## 2020-10-24 ENCOUNTER — Other Ambulatory Visit: Payer: Self-pay

## 2020-10-24 ENCOUNTER — Inpatient Hospital Stay: Payer: PPO

## 2020-10-24 VITALS — BP 158/58 | HR 67 | Resp 18

## 2020-10-24 DIAGNOSIS — D693 Immune thrombocytopenic purpura: Secondary | ICD-10-CM | POA: Diagnosis not present

## 2020-10-24 DIAGNOSIS — C9110 Chronic lymphocytic leukemia of B-cell type not having achieved remission: Secondary | ICD-10-CM

## 2020-10-24 DIAGNOSIS — I629 Nontraumatic intracranial hemorrhage, unspecified: Secondary | ICD-10-CM

## 2020-10-24 LAB — CBC WITH DIFFERENTIAL/PLATELET
Abs Immature Granulocytes: 0.01 10*3/uL (ref 0.00–0.07)
Basophils Absolute: 0 10*3/uL (ref 0.0–0.1)
Basophils Relative: 0 %
Eosinophils Absolute: 0.1 10*3/uL (ref 0.0–0.5)
Eosinophils Relative: 1 %
HCT: 41.8 % (ref 36.0–46.0)
Hemoglobin: 14.5 g/dL (ref 12.0–15.0)
Immature Granulocytes: 0 %
Lymphocytes Relative: 46 %
Lymphs Abs: 2.6 10*3/uL (ref 0.7–4.0)
MCH: 32.1 pg (ref 26.0–34.0)
MCHC: 34.7 g/dL (ref 30.0–36.0)
MCV: 92.5 fL (ref 80.0–100.0)
Monocytes Absolute: 0.8 10*3/uL (ref 0.1–1.0)
Monocytes Relative: 15 %
Neutro Abs: 2.1 10*3/uL (ref 1.7–7.7)
Neutrophils Relative %: 38 %
Platelets: 167 10*3/uL (ref 150–400)
RBC: 4.52 MIL/uL (ref 3.87–5.11)
RDW: 12.2 % (ref 11.5–15.5)
WBC: 5.6 10*3/uL (ref 4.0–10.5)
nRBC: 0 % (ref 0.0–0.2)

## 2020-10-24 LAB — CMP (CANCER CENTER ONLY)
ALT: 14 U/L (ref 0–44)
AST: 21 U/L (ref 15–41)
Albumin: 4.1 g/dL (ref 3.5–5.0)
Alkaline Phosphatase: 93 U/L (ref 38–126)
Anion gap: 6 (ref 5–15)
BUN: 16 mg/dL (ref 8–23)
CO2: 28 mmol/L (ref 22–32)
Calcium: 9.4 mg/dL (ref 8.9–10.3)
Chloride: 107 mmol/L (ref 98–111)
Creatinine: 0.78 mg/dL (ref 0.44–1.00)
GFR, Estimated: >60 mL/min (ref >60)
Glucose, Bld: 96 mg/dL (ref 70–99)
Potassium: 4.2 mmol/L (ref 3.5–5.1)
Sodium: 141 mmol/L (ref 135–145)
Total Bilirubin: 0.6 mg/dL (ref 0.3–1.2)
Total Protein: 6.5 g/dL (ref 6.5–8.1)

## 2020-10-24 MED ORDER — ROMIPLOSTIM 125 MCG ~~LOC~~ SOLR
100.0000 ug | SUBCUTANEOUS | Status: DC
  Administered 2020-10-24: 100 ug via SUBCUTANEOUS
  Filled 2020-10-24: qty 0.2

## 2020-10-30 MED FILL — VENCLEXTA 100 MG TABS: 100 | 30 days supply | Qty: 60 | Fill #2

## 2020-10-31 ENCOUNTER — Other Ambulatory Visit: Payer: Self-pay

## 2020-10-31 ENCOUNTER — Inpatient Hospital Stay: Payer: PPO

## 2020-10-31 VITALS — BP 150/62 | HR 65 | Temp 97.8°F | Resp 18

## 2020-10-31 DIAGNOSIS — D693 Immune thrombocytopenic purpura: Secondary | ICD-10-CM

## 2020-10-31 DIAGNOSIS — I629 Nontraumatic intracranial hemorrhage, unspecified: Secondary | ICD-10-CM

## 2020-10-31 DIAGNOSIS — C9110 Chronic lymphocytic leukemia of B-cell type not having achieved remission: Secondary | ICD-10-CM

## 2020-10-31 LAB — CBC WITH DIFFERENTIAL/PLATELET
Abs Immature Granulocytes: 0.02 10*3/uL (ref 0.00–0.07)
Basophils Absolute: 0 10*3/uL (ref 0.0–0.1)
Basophils Relative: 1 %
Eosinophils Absolute: 0.1 10*3/uL (ref 0.0–0.5)
Eosinophils Relative: 1 %
HCT: 43.6 % (ref 36.0–46.0)
Hemoglobin: 14.9 g/dL (ref 12.0–15.0)
Immature Granulocytes: 0 %
Lymphocytes Relative: 37 %
Lymphs Abs: 2.2 10*3/uL (ref 0.7–4.0)
MCH: 32 pg (ref 26.0–34.0)
MCHC: 34.2 g/dL (ref 30.0–36.0)
MCV: 93.8 fL (ref 80.0–100.0)
Monocytes Absolute: 0.9 10*3/uL (ref 0.1–1.0)
Monocytes Relative: 15 %
Neutro Abs: 2.7 10*3/uL (ref 1.7–7.7)
Neutrophils Relative %: 46 %
Platelets: 214 10*3/uL (ref 150–400)
RBC: 4.65 MIL/uL (ref 3.87–5.11)
RDW: 12.2 % (ref 11.5–15.5)
WBC: 5.9 10*3/uL (ref 4.0–10.5)
nRBC: 0 % (ref 0.0–0.2)

## 2020-10-31 MED ORDER — ROMIPLOSTIM 125 MCG ~~LOC~~ SOLR
100.0000 ug | SUBCUTANEOUS | Status: DC
  Administered 2020-10-31: 100 ug via SUBCUTANEOUS
  Filled 2020-10-31: qty 0.2

## 2020-10-31 NOTE — Patient Instructions (Signed)
Romiplostim injection What is this medicine? ROMIPLOSTIM (roe mi PLOE stim) helps your body make more platelets. This medicine is used to treat low platelets caused by chronic idiopathic thrombocytopenic purpura (ITP) or a bone marrow syndrome caused by radiation sickness. This medicine may be used for other purposes; ask your health care provider or pharmacist if you have questions. COMMON BRAND NAME(S): Nplate What should I tell my health care provider before I take this medicine? They need to know if you have any of these conditions:  blood clots  myelodysplastic syndrome  an unusual or allergic reaction to romiplostim, mannitol, other medicines, foods, dyes, or preservatives  pregnant or trying to get pregnant  breast-feeding How should I use this medicine? This medicine is injected under the skin. It is given by a health care provider in a hospital or clinic setting. A special MedGuide will be given to you before each treatment. Be sure to read this information carefully each time. Talk to your health care provider about the use of this medicine in children. While it may be prescribed for children as young as newborns for selected conditions, precautions do apply. Overdosage: If you think you have taken too much of this medicine contact a poison control center or emergency room at once. NOTE: This medicine is only for you. Do not share this medicine with others. What if I miss a dose? Keep appointments for follow-up doses. It is important not to miss your dose. Call your health care provider if you are unable to keep an appointment. What may interact with this medicine? Interactions are not expected. This list may not describe all possible interactions. Give your health care provider a list of all the medicines, herbs, non-prescription drugs, or dietary supplements you use. Also tell them if you smoke, drink alcohol, or use illegal drugs. Some items may interact with your  medicine. What should I watch for while using this medicine? Visit your health care provider for regular checks on your progress. You may need blood work done while you are taking this medicine. Your condition will be monitored carefully while you are receiving this medicine. It is important not to miss any appointments. What side effects may I notice from receiving this medicine? Side effects that you should report to your doctor or health care professional as soon as possible:  allergic reactions (skin rash, itching or hives; swelling of the face, lips, or tongue)  bleeding (bloody or black, tarry stools; red or dark brown urine; spitting up blood or brown material that looks like coffee grounds; red spots on the skin; unusual bruising or bleeding from the eyes, gums, or nose)  blood clot (chest pain; shortness of breath; pain, swelling, or warmth in the leg)  stroke (changes in vision; confusion; trouble speaking or understanding; severe headaches; sudden numbness or weakness of the face, arm or leg; trouble walking; dizziness; loss of balance or coordination) Side effects that usually do not require medical attention (report to your doctor or health care professional if they continue or are bothersome):  diarrhea  dizziness  headache  joint pain  muscle pain  stomach pain  trouble sleeping This list may not describe all possible side effects. Call your doctor for medical advice about side effects. You may report side effects to FDA at 1-800-FDA-1088. Where should I keep my medicine? This medicine is given in a hospital or clinic. It will not be stored at home. NOTE: This sheet is a summary. It may not cover all possible   information. If you have questions about this medicine, talk to your doctor, pharmacist, or health care provider.  2021 Elsevier/Gold Standard (2019-10-15 10:28:13)  

## 2020-11-07 ENCOUNTER — Inpatient Hospital Stay: Payer: PPO

## 2020-11-07 ENCOUNTER — Other Ambulatory Visit: Payer: Self-pay

## 2020-11-07 VITALS — BP 142/64 | HR 60 | Temp 97.9°F | Resp 13

## 2020-11-07 DIAGNOSIS — D693 Immune thrombocytopenic purpura: Secondary | ICD-10-CM

## 2020-11-07 DIAGNOSIS — I629 Nontraumatic intracranial hemorrhage, unspecified: Secondary | ICD-10-CM

## 2020-11-07 DIAGNOSIS — C9110 Chronic lymphocytic leukemia of B-cell type not having achieved remission: Secondary | ICD-10-CM

## 2020-11-07 LAB — CMP (CANCER CENTER ONLY)
ALT: 12 U/L (ref 0–44)
AST: 20 U/L (ref 15–41)
Albumin: 4.2 g/dL (ref 3.5–5.0)
Alkaline Phosphatase: 93 U/L (ref 38–126)
Anion gap: 7 (ref 5–15)
BUN: 21 mg/dL (ref 8–23)
CO2: 29 mmol/L (ref 22–32)
Calcium: 9.8 mg/dL (ref 8.9–10.3)
Chloride: 109 mmol/L (ref 98–111)
Creatinine: 0.77 mg/dL (ref 0.44–1.00)
GFR, Estimated: >60 mL/min (ref >60)
Glucose, Bld: 90 mg/dL (ref 70–99)
Potassium: 4.7 mmol/L (ref 3.5–5.1)
Sodium: 145 mmol/L (ref 135–145)
Total Bilirubin: 0.4 mg/dL (ref 0.3–1.2)
Total Protein: 6.7 g/dL (ref 6.5–8.1)

## 2020-11-07 LAB — CBC WITH DIFFERENTIAL/PLATELET
Abs Immature Granulocytes: 0.01 10*3/uL (ref 0.00–0.07)
Basophils Absolute: 0 10*3/uL (ref 0.0–0.1)
Basophils Relative: 1 %
Eosinophils Absolute: 0.1 10*3/uL (ref 0.0–0.5)
Eosinophils Relative: 2 %
HCT: 41.6 % (ref 36.0–46.0)
Hemoglobin: 14.4 g/dL (ref 12.0–15.0)
Immature Granulocytes: 0 %
Lymphocytes Relative: 39 %
Lymphs Abs: 2.3 10*3/uL (ref 0.7–4.0)
MCH: 31.9 pg (ref 26.0–34.0)
MCHC: 34.6 g/dL (ref 30.0–36.0)
MCV: 92 fL (ref 80.0–100.0)
Monocytes Absolute: 0.8 10*3/uL (ref 0.1–1.0)
Monocytes Relative: 14 %
Neutro Abs: 2.6 10*3/uL (ref 1.7–7.7)
Neutrophils Relative %: 44 %
Platelets: 286 10*3/uL (ref 150–400)
RBC: 4.52 MIL/uL (ref 3.87–5.11)
RDW: 12.3 % (ref 11.5–15.5)
WBC: 5.9 10*3/uL (ref 4.0–10.5)
nRBC: 0 % (ref 0.0–0.2)

## 2020-11-07 MED ORDER — ROMIPLOSTIM 125 MCG ~~LOC~~ SOLR
50.0000 ug | SUBCUTANEOUS | Status: DC
  Administered 2020-11-07: 50 ug via SUBCUTANEOUS
  Filled 2020-11-07: qty 0.1

## 2020-11-12 IMAGING — CT CT HEAD CODE STROKE
4 series · 15 of 47 positions shown, 17 images · non-contrast
Comparison: CT head 08/09/2018, brain MRI 01/14/2016

CLINICAL DATA: Code stroke. Neuro deficit, acute, stroke suspected.
Additional history provided: Last known normal [DATE], slurred
speech and confusion

EXAM:
CT HEAD WITHOUT CONTRAST
TECHNIQUE: Contiguous axial images were obtained from the base of the skull
through the vertex without intravenous contrast.

[Series 3: head 5.0 st · axial · 0.39mm/px · z∈[-136,-36]mm · 7 of 28 slices shown, 9 images]
[im 4/28  brain]
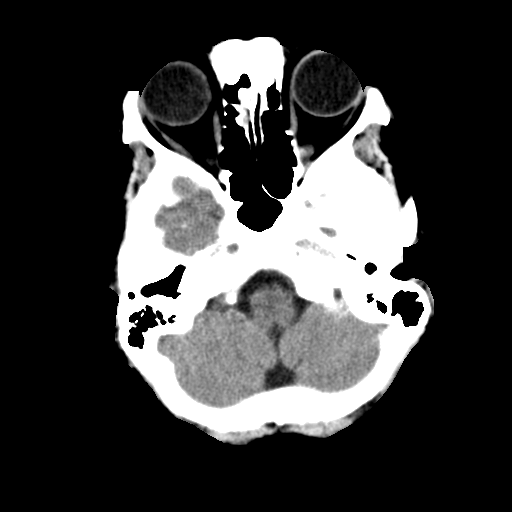
[im 4/28  bone]
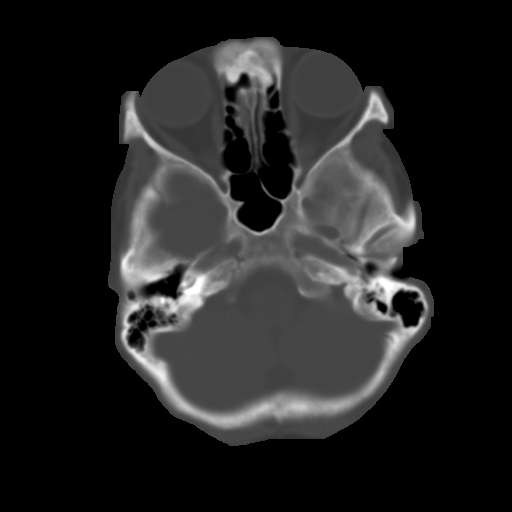
[im 7/28  brain]
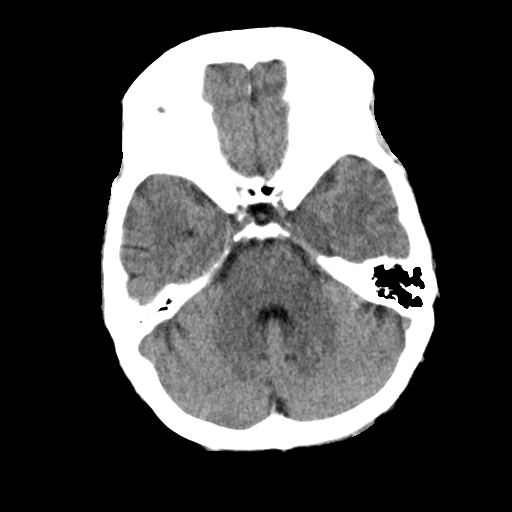
[im 11/28  brain]
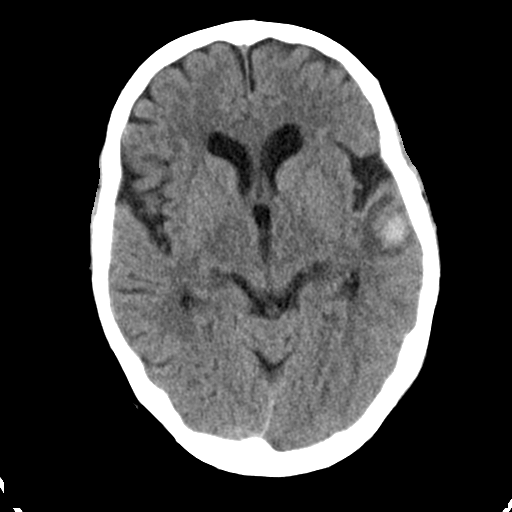
[im 14/28  brain]
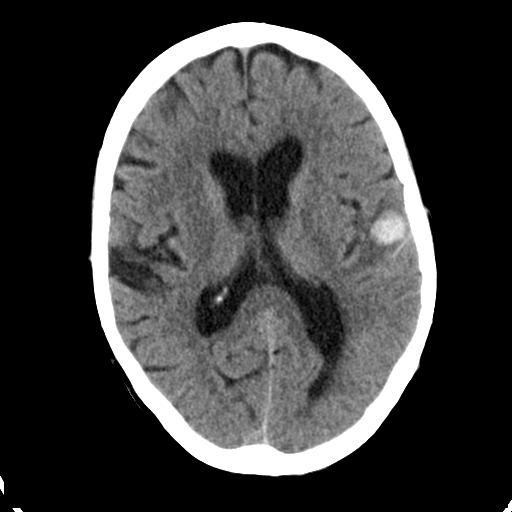
[im 17/28  brain]
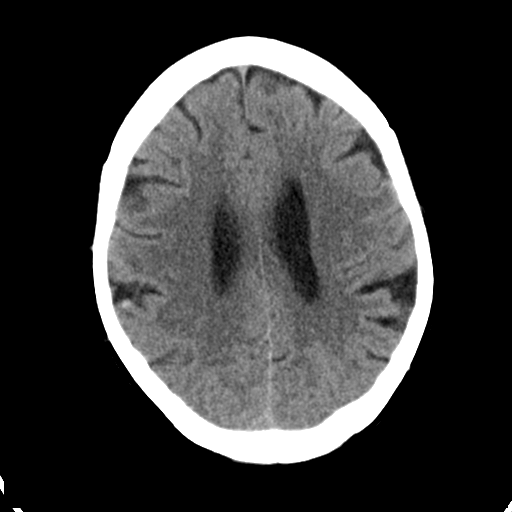
[im 17/28  bone]
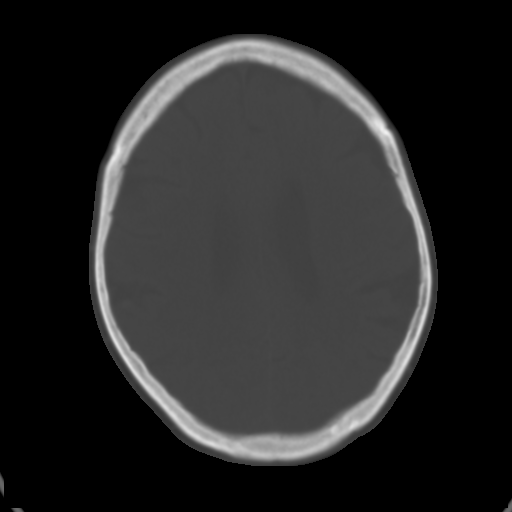
[im 21/28  brain]
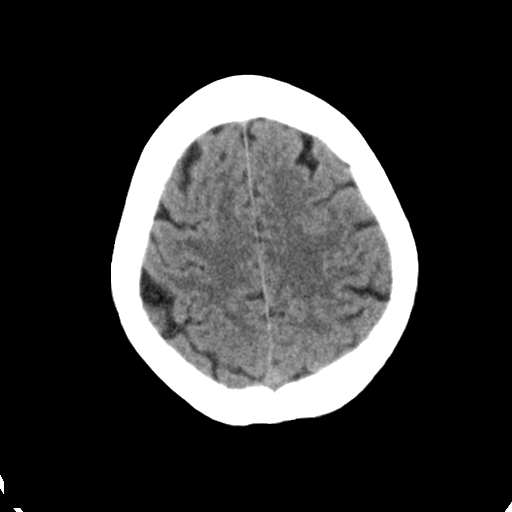
[im 24/28  brain]
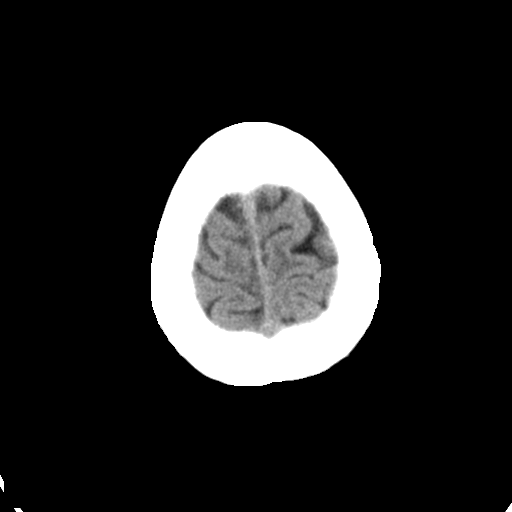

[Series 4: head 2.0 bone · axial · 0.39mm/px · z∈[-138,-124]mm · 2 of 70 slices shown]
[im 7/70  bone]
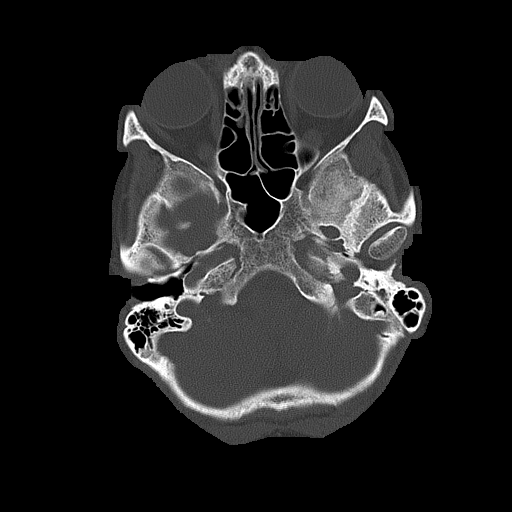
[im 14/70  bone]
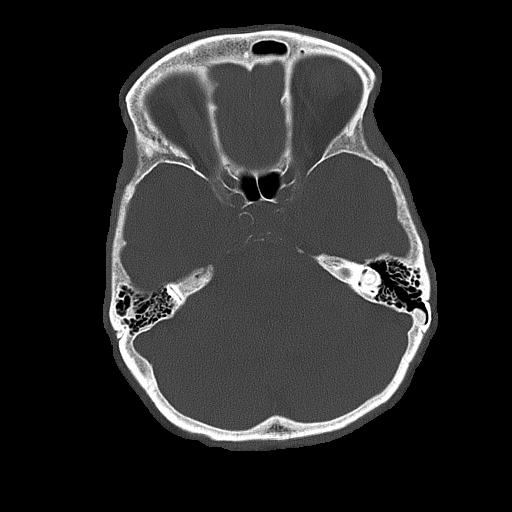

[Series 5: head 3.0 cor st · coronal · 0.27mm/px · 3 of 67 slices shown]
[im 23/67  brain]
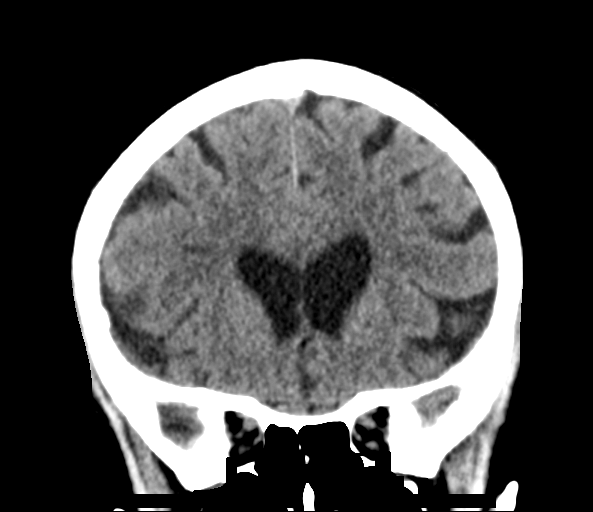
[im 30/67  brain]
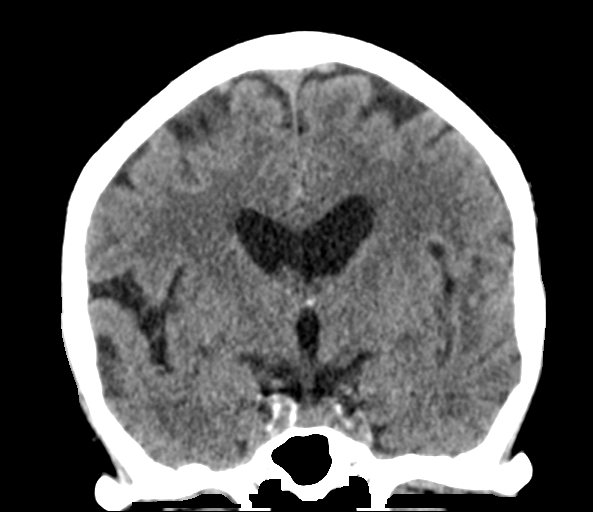
[im 37/67  brain]
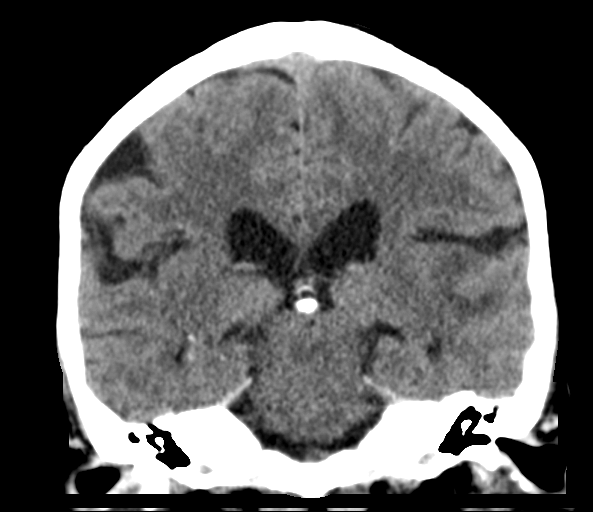

[Series 6: head 3.0 sag st · sagittal · 0.27mm/px · 3 of 56 slices shown]
[im 19/56  brain]
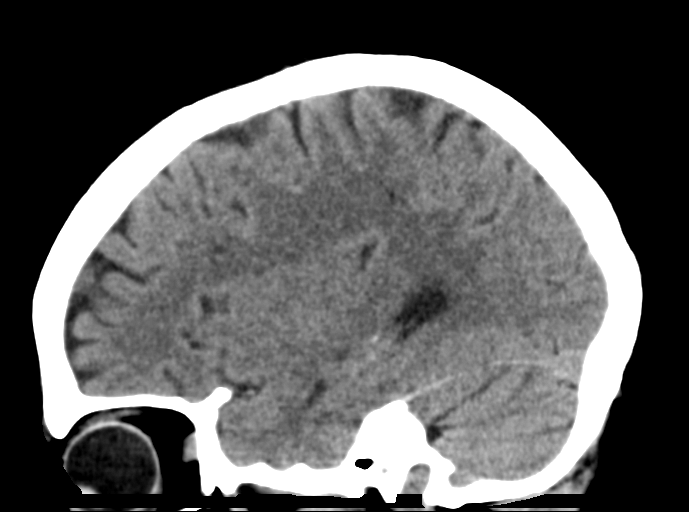
[im 28/56  brain]
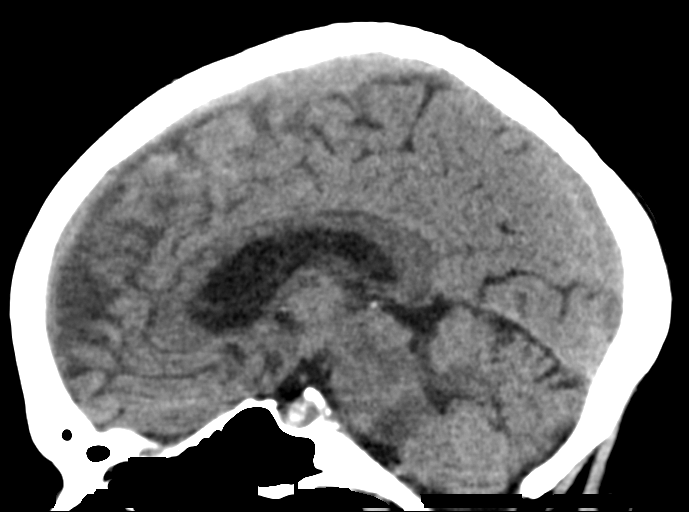
[im 37/56  brain]
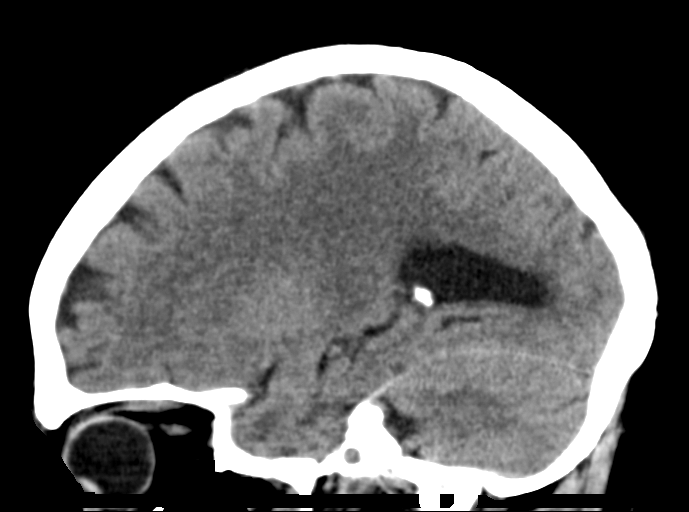

[15 of 47 positions shown; findings below may reference images not displayed]

FINDINGS: Brain:

There is an acute parenchymal hemorrhage centered within the left
temporal lobe with mild surrounding edema. The hemorrhage measures
1.6 x 1.6 x 2.0 cm. Trace surrounding subarachnoid hemorrhage.
Minimal regional mass effect. No effacement of the ventricular
system or midline shift. Additional 3 mm hyperdense focus within the
cortex of the right parietal lobe (series 6, image 11) (series 3,
image 17). No extra-axial fluid collection. Mild ill-defined
hypoattenuation within the cerebral white matter is nonspecific, but
consistent with chronic small vessel ischemic disease. Mild
generalized parenchymal atrophy.

Vascular: No hyperdense vessel.  Atherosclerotic calcifications.

Skull: Normal. Negative for fracture or focal lesion.

Sinuses/Orbits: Visualized orbits demonstrate no acute abnormality.

Other: No significant paranasal sinus disease or mastoid effusion at
the imaged levels.

These results were called by telephone at the time of interpretation
on 09/16/2019 at [DATE] to provider Dr. Ourari, who verbally
acknowledged these results.
IMPRESSION: 2 cm left temporal lobe parenchymal hemorrhage with mild surrounding
edema. Trace surrounding subarachnoid hemorrhage. Contrast-enhanced
brain MRI is recommended once the hematoma involutes to assess for
an underlying lesion.

Additional 3 mm hyperdense focus within the right parietal cortex.
This may reflect an additional tiny parenchymal hemorrhage or other
hyperdense lesion. This too should have MRI follow-up.

Generalized parenchymal atrophy and chronic small vessel ischemic
disease.

## 2020-11-14 ENCOUNTER — Inpatient Hospital Stay: Payer: PPO

## 2020-11-14 ENCOUNTER — Other Ambulatory Visit: Payer: Self-pay

## 2020-11-14 ENCOUNTER — Inpatient Hospital Stay: Payer: PPO | Attending: Hematology

## 2020-11-14 VITALS — BP 161/65 | HR 65 | Temp 98.0°F | Resp 18

## 2020-11-14 DIAGNOSIS — D693 Immune thrombocytopenic purpura: Secondary | ICD-10-CM

## 2020-11-14 DIAGNOSIS — I629 Nontraumatic intracranial hemorrhage, unspecified: Secondary | ICD-10-CM

## 2020-11-14 DIAGNOSIS — C9110 Chronic lymphocytic leukemia of B-cell type not having achieved remission: Secondary | ICD-10-CM | POA: Diagnosis not present

## 2020-11-14 DIAGNOSIS — Z801 Family history of malignant neoplasm of trachea, bronchus and lung: Secondary | ICD-10-CM | POA: Diagnosis not present

## 2020-11-14 DIAGNOSIS — Z79899 Other long term (current) drug therapy: Secondary | ICD-10-CM | POA: Diagnosis not present

## 2020-11-14 DIAGNOSIS — I10 Essential (primary) hypertension: Secondary | ICD-10-CM | POA: Diagnosis not present

## 2020-11-14 LAB — CBC WITH DIFFERENTIAL/PLATELET
Abs Immature Granulocytes: 0.02 10*3/uL (ref 0.00–0.07)
Basophils Absolute: 0 10*3/uL (ref 0.0–0.1)
Basophils Relative: 1 %
Eosinophils Absolute: 0.1 10*3/uL (ref 0.0–0.5)
Eosinophils Relative: 1 %
HCT: 45.3 % (ref 36.0–46.0)
Hemoglobin: 15.1 g/dL — ABNORMAL HIGH (ref 12.0–15.0)
Immature Granulocytes: 0 %
Lymphocytes Relative: 50 %
Lymphs Abs: 3.1 10*3/uL (ref 0.7–4.0)
MCH: 31.5 pg (ref 26.0–34.0)
MCHC: 33.3 g/dL (ref 30.0–36.0)
MCV: 94.4 fL (ref 80.0–100.0)
Monocytes Absolute: 0.8 10*3/uL (ref 0.1–1.0)
Monocytes Relative: 12 %
Neutro Abs: 2.2 10*3/uL (ref 1.7–7.7)
Neutrophils Relative %: 36 %
Platelets: 261 10*3/uL (ref 150–400)
RBC: 4.8 MIL/uL (ref 3.87–5.11)
RDW: 12.2 % (ref 11.5–15.5)
WBC: 6.2 10*3/uL (ref 4.0–10.5)
nRBC: 0 % (ref 0.0–0.2)

## 2020-11-14 LAB — CMP (CANCER CENTER ONLY)
ALT: 13 U/L (ref 0–44)
AST: 23 U/L (ref 15–41)
Albumin: 4.5 g/dL (ref 3.5–5.0)
Alkaline Phosphatase: 101 U/L (ref 38–126)
Anion gap: 9 (ref 5–15)
BUN: 19 mg/dL (ref 8–23)
CO2: 29 mmol/L (ref 22–32)
Calcium: 10.1 mg/dL (ref 8.9–10.3)
Chloride: 108 mmol/L (ref 98–111)
Creatinine: 0.88 mg/dL (ref 0.44–1.00)
GFR, Estimated: >60 mL/min (ref >60)
Glucose, Bld: 102 mg/dL — ABNORMAL HIGH (ref 70–99)
Potassium: 4.9 mmol/L (ref 3.5–5.1)
Sodium: 146 mmol/L — ABNORMAL HIGH (ref 135–145)
Total Bilirubin: 0.5 mg/dL (ref 0.3–1.2)
Total Protein: 7.2 g/dL (ref 6.5–8.1)

## 2020-11-14 MED ORDER — ROMIPLOSTIM 125 MCG ~~LOC~~ SOLR
25.0000 ug | SUBCUTANEOUS | Status: DC
  Administered 2020-11-14: 25 ug via SUBCUTANEOUS
  Filled 2020-11-14: qty 0.05

## 2020-11-14 NOTE — Patient Instructions (Signed)
Romiplostim injection What is this medicine? ROMIPLOSTIM (roe mi PLOE stim) helps your body make more platelets. This medicine is used to treat low platelets caused by chronic idiopathic thrombocytopenic purpura (ITP) or a bone marrow syndrome caused by radiation sickness. This medicine may be used for other purposes; ask your health care provider or pharmacist if you have questions. COMMON BRAND NAME(S): Nplate What should I tell my health care provider before I take this medicine? They need to know if you have any of these conditions:  blood clots  myelodysplastic syndrome  an unusual or allergic reaction to romiplostim, mannitol, other medicines, foods, dyes, or preservatives  pregnant or trying to get pregnant  breast-feeding How should I use this medicine? This medicine is injected under the skin. It is given by a health care provider in a hospital or clinic setting. A special MedGuide will be given to you before each treatment. Be sure to read this information carefully each time. Talk to your health care provider about the use of this medicine in children. While it may be prescribed for children as young as newborns for selected conditions, precautions do apply. Overdosage: If you think you have taken too much of this medicine contact a poison control center or emergency room at once. NOTE: This medicine is only for you. Do not share this medicine with others. What if I miss a dose? Keep appointments for follow-up doses. It is important not to miss your dose. Call your health care provider if you are unable to keep an appointment. What may interact with this medicine? Interactions are not expected. This list may not describe all possible interactions. Give your health care provider a list of all the medicines, herbs, non-prescription drugs, or dietary supplements you use. Also tell them if you smoke, drink alcohol, or use illegal drugs. Some items may interact with your  medicine. What should I watch for while using this medicine? Visit your health care provider for regular checks on your progress. You may need blood work done while you are taking this medicine. Your condition will be monitored carefully while you are receiving this medicine. It is important not to miss any appointments. What side effects may I notice from receiving this medicine? Side effects that you should report to your doctor or health care professional as soon as possible:  allergic reactions (skin rash, itching or hives; swelling of the face, lips, or tongue)  bleeding (bloody or black, tarry stools; red or dark brown urine; spitting up blood or brown material that looks like coffee grounds; red spots on the skin; unusual bruising or bleeding from the eyes, gums, or nose)  blood clot (chest pain; shortness of breath; pain, swelling, or warmth in the leg)  stroke (changes in vision; confusion; trouble speaking or understanding; severe headaches; sudden numbness or weakness of the face, arm or leg; trouble walking; dizziness; loss of balance or coordination) Side effects that usually do not require medical attention (report to your doctor or health care professional if they continue or are bothersome):  diarrhea  dizziness  headache  joint pain  muscle pain  stomach pain  trouble sleeping This list may not describe all possible side effects. Call your doctor for medical advice about side effects. You may report side effects to FDA at 1-800-FDA-1088. Where should I keep my medicine? This medicine is given in a hospital or clinic. It will not be stored at home. NOTE: This sheet is a summary. It may not cover all possible   information. If you have questions about this medicine, talk to your doctor, pharmacist, or health care provider.  2021 Elsevier/Gold Standard (2019-10-15 10:28:13)  

## 2020-11-17 ENCOUNTER — Ambulatory Visit: Payer: PPO

## 2020-11-17 ENCOUNTER — Ambulatory Visit: Payer: PPO | Admitting: Hematology

## 2020-11-17 ENCOUNTER — Other Ambulatory Visit: Payer: PPO

## 2020-11-20 ENCOUNTER — Other Ambulatory Visit: Payer: Self-pay | Admitting: Hematology

## 2020-11-20 NOTE — Progress Notes (Signed)
HEMATOLOGY/ONCOLOGY CLINIC NOTE  Date of Service: 11/21/2020  Patient Care Team: [name], MD as PCP - General (Family Medicine) [name], MD as Consulting Physician (Hematology)  CHIEF COMPLAINTS/PURPOSE OF CONSULTATION:  F/u for CLL and ITP  HISTORY OF PRESENTING ILLNESS:   Selena Branch is a wonderful 80 y.o. female who has been transferred to [place] from Dr. Mathis Dad [place] for evaluation and management of CLL. The pt reports that she is doing well overall.  The pt reports that has been slightly fatigued in the past 1-2 months, but thinks it is from the heat. The fatigue does not affect her ability to function. Denies fevers, chills, and night sweats. She reports that she saw her PCP in August, who noticed that her blood counts were high and referred her to Dr. Irene Limbo.  Lab results today (05/31/2019) of CBC w diff and CMP is as follows: all values are WNL except for WBC at 39.1k, neutro abs at 1.6k, lymphs abs at 36.8k, monocytes abs at 0.0k, eosinophils abs at 0.8k, BUN at 27, Total protein at 6.4 05/31/2019 LDH is pending  On review of systems, pt reports mild fatigue and denies belly pain, infection issues and any other symptoms.   INTERVAL HISTORY:  Selena Branch is a 80 y.o. 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 The patient's last visit with [place] was on 08/18/2020. The pt reports that she is doing well overall. We are joined today by her daughter.  The pt reports no new symptoms. The pt notes that due to beginning of the year and high insurance deductible, the weekly nplate has been very expensive. The pt wanted to know if she could switch to the pills and see if this would decrease costs.   Lab results today 11/21/2020 of CBC w/diff is as follows: all values are WNL except for Plt of 71K.  On review of systems, pt denies bleeding issues, infection issues, abdominal pain, back pain, leg swelling, and any other symptoms.  MEDICAL HISTORY:  Past Medical  History:  Diagnosis Date  . Anxiety   . 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 Glaucoma   . Hyperlipemia   . Hypertension   . Osteoporosis     SURGICAL HISTORY: No past surgical history on file.  SOCIAL HISTORY: Social History   Socioeconomic History  . Marital status: Married    Spouse name: Not on file  . Number of children: 1  . Years of education: HS  . Highest education level: Not on file  Occupational History  . Occupation: Retired  Tobacco Use  . Smoking status: Never Smoker  . Smokeless tobacco: Never Used  Substance and Sexual Activity  . Alcohol use: No    Alcohol/week: 0.0 standard drinks  . Drug use: No  . Sexual activity: Not on file  Other Topics Concern  . Not on file  Social History Narrative   Drinks 2 cups of coffee   Social Determinants of Health   Financial Resource Strain: Not on file  Food Insecurity: Not on file  Transportation Needs: Not on file  Physical Activity: Not on file  Stress: Not on file  Social Connections: Not on file  Intimate Partner Violence: Not on file    FAMILY HISTORY: Family History  Problem Relation Age of Onset  . Stroke Mother   . Lung cancer Father   . Leukemia Neg Hx     ALLERGIES:  is allergic to codeine, evista [raloxifene], ibandronic  acid, and other.  MEDICATIONS:  Current Outpatient Medications  Medication Sig Dispense Refill  . eltrombopag (PROMACTA) 12.5 MG tablet Take 1 tablet (12.5 mg total) by mouth daily. Take on an empty stomach, 1 hour before a meal or 2 hours after. 30 tablet 1  . amLODipine (NORVASC) 10 MG tablet Take 1 tablet (10 mg total) by mouth at bedtime. 30 tablet 0  . atorvastatin (LIPITOR) 40 MG tablet Take 40 mg by mouth daily.     . Brinzolamide-Brimonidine (SIMBRINZA) 1-0.2 % SUSP Place 1 drop into both eyes daily at 12 noon.    . Calcium Carbonate-Vitamin D (CALCIUM-D PO) Take 1 tablet by mouth daily.    . Cholecalciferol (VITAMIN D3) 2000 units TABS Take 2,000  Units by mouth daily.     . dorzolamide-timolol (COSOPT) 22.3-6.8 MG/ML ophthalmic solution Place 1 drop into both eyes daily.     . furosemide (LASIX) 20 MG tablet Take 40 mg for 3 days then resume your 20 mg daily (Patient taking differently: Take 20 mg by mouth daily. ) 30 tablet   . latanoprost (XALATAN) 0.005 % ophthalmic solution Place 1 drop into both eyes at bedtime.     . Multiple Vitamin (MULTIVITAMIN WITH MINERALS) TABS tablet Take 1 tablet by mouth daily.    . quinapril (ACCUPRIL) 40 MG tablet Take 40 mg by mouth daily.     . VENCLEXTA 100 MG tablet TAKE 2 TABLETS (200 MG) BY MOUTH DAILY. 60 tablet 2   No current facility-administered medications for this visit.    REVIEW OF SYSTEMS:   10 Point review of Systems was done is negative except as noted above.   PHYSICAL EXAMINATION:  ECOG FS:2 - Symptomatic, <50% confined to bed  Vitals:   11/21/20 1037  BP: (!) 159/62  Pulse: 64  Resp: 18  Temp: 97.6 F (36.4 C)  SpO2: 100%   Wt Readings from Last 3 Encounters:  11/21/20 105 lb 12.8 oz (48 kg)  08/18/20 105 lb 8 oz (47.9 kg)  07/18/20 105 lb 6.4 oz (47.8 kg)   Body mass index is 22.11 kg/m.    GENERAL:alert, in no acute distress and comfortable SKIN: no acute rashes, no significant lesions EYES: conjunctiva are pink and non-injected, sclera anicteric OROPHARYNX: MMM, no exudates, no oropharyngeal erythema or ulceration NECK: supple, no JVD LYMPH:  no palpable lymphadenopathy in the cervical, axillary or inguinal regions LUNGS: clear to auscultation b/l with normal respiratory effort HEART: regular rate & rhythm ABDOMEN:  normoactive bowel sounds , non tender, not distended. Extremity: no pedal edema PSYCH: alert & oriented x 3 with fluent speech NEURO: no focal motor/sensory deficits   LABORATORY DATA:  I have reviewed the data as listed  . CBC Latest Ref Rng & Units 11/21/2020 11/14/2020 11/07/2020  WBC 4.0 - 10.5 K/uL 5.9 6.2 5.9  Hemoglobin 12.0 - 15.0  g/dL 15.0 15.1(H) 14.4  Hematocrit 36.0 - 46.0 % 43.6 45.3 41.6  Platelets 150 - 400 K/uL 71(L) 261 286    . CMP Latest Ref Rng & Units 11/14/2020 11/07/2020 10/24/2020  Glucose 70 - 99 mg/dL 102(H) 90 96  BUN 8 - 23 mg/dL 19 21 16   Creatinine 0.44 - 1.00 mg/dL 0.88 0.77 0.78  Sodium 135 - 145 mmol/L 146(H) 145 141  Potassium 3.5 - 5.1 mmol/L 4.9 4.7 4.2  Chloride 98 - 111 mmol/L 108 109 107  CO2 22 - 32 mmol/L 29 29 28   Calcium 8.9 - 10.3 mg/dL 10.1 9.8 9.4  Total Protein 6.5 - 8.1 g/dL 7.2 6.7 6.5  Total Bilirubin 0.3 - 1.2 mg/dL 0.5 0.4 0.6  Alkaline Phos 38 - 126 U/L 101 93 93  AST 15 - 41 U/L 23 20 21   ALT 0 - 44 U/L 13 12 14    02/27/2019 FISH:    RADIOGRAPHIC STUDIES: I have personally reviewed the radiological images as listed and agreed with the findings in the report. No results found.  ASSESSMENT & PLAN:   #1 CLL Previous CLL prognostic FISH panel without detectable mutations. Has been Rai stage 0.. Now with new anemia and severe thrombocytopenia. WBC counts have increased significantly to 84k from 39k 3 months ago.  Concerning for change in tempo of her disease. -08/31/2019 CT C/A/P ([id number]) ([id number]) revealed "1. Mild multistation lymphadenopathy in the retroperitoneum and bilateral pelvis as detailed, compatible with lymphoma. Normal size spleen. 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  However cannot rule out bone marrow involvement as an etiology of thrombocytopenia as well.  #3 new mild normocytic anemia.  Hemoglobin 11.7 with an MCV of 97.4. --- now resolved hgb today 14.8 Likely related to her CLL. 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  #4 h/o intraparenchymal bleed- rpt CT head stable (due to uncontrolled HTN and thrombocytopenia)  PLAN: -Discussed pt labwork today,  11/21/2020; Plt decreased to 71K. Will need nplate. -Advised pt that we will get repeat scans and see if we can get off of Venetoclax at this time. The pt has been on this for over a year. -Advised pt that Venetoclax could be causing some decrease in plt as well.  -Advised pt that we can switch from nplate to the pills (Promacta) due to insurance costs and concerns.  -The pt has no prohibitive toxicities from continuing 200 mg Venetoclax daily at this time. -No lab or clinical evidence of CLL progression at this time.  -Will Rx Promacta. Will start on half dosage and watch liver function. Goal is lowest dose possible on a weaning off protocol. -Will start on 12.5 mg Promacta once daily.  -Will see back in 2 weeks. CT chest/abd/pel in 1 week.   FOLLOW UP: Labs weekly Transitioning to Promacta from Nplate in about 2 weeks as soon as Promacta available CT chest/abd/pelvis in 1 week MD visit in 2 weeks    The total time spent in the appointment was 20 minutes and more than 50% was on counseling and direct patient cares.   All of the patient's questions were answered with apparent satisfaction. The patient knows to call the clinic with any problems, questions or concerns.   Sullivan Lone MD [place] AAHIVMS [facility] Hematology/Oncology Physician [facility]  (Office):       [phone] (Work cell):  [phone] (Fax):           [phone]  11/21/2020 11:34 AM  I, Reinaldo Raddle, am acting as scribe for Dr. Sullivan Lone, MD.   .I have reviewed the above documentation for accuracy and completeness, and I agree with the above. [name] MD

## 2020-11-21 ENCOUNTER — Telehealth: Payer: Self-pay

## 2020-11-21 ENCOUNTER — Inpatient Hospital Stay: Payer: PPO

## 2020-11-21 ENCOUNTER — Other Ambulatory Visit: Payer: Self-pay

## 2020-11-21 ENCOUNTER — Inpatient Hospital Stay: Payer: PPO | Admitting: Hematology

## 2020-11-21 ENCOUNTER — Other Ambulatory Visit: Payer: Self-pay | Admitting: Hematology

## 2020-11-21 ENCOUNTER — Telehealth: Payer: Self-pay | Admitting: Pharmacist

## 2020-11-21 VITALS — BP 159/62 | HR 64 | Temp 97.6°F | Resp 18 | Ht <= 58 in | Wt 105.8 lb

## 2020-11-21 DIAGNOSIS — D693 Immune thrombocytopenic purpura: Secondary | ICD-10-CM

## 2020-11-21 DIAGNOSIS — C9110 Chronic lymphocytic leukemia of B-cell type not having achieved remission: Secondary | ICD-10-CM

## 2020-11-21 DIAGNOSIS — I629 Nontraumatic intracranial hemorrhage, unspecified: Secondary | ICD-10-CM

## 2020-11-21 LAB — CBC WITH DIFFERENTIAL/PLATELET
Abs Immature Granulocytes: 0.01 10*3/uL (ref 0.00–0.07)
Basophils Absolute: 0 10*3/uL (ref 0.0–0.1)
Basophils Relative: 1 %
Eosinophils Absolute: 0.1 10*3/uL (ref 0.0–0.5)
Eosinophils Relative: 1 %
HCT: 43.6 % (ref 36.0–46.0)
Hemoglobin: 15 g/dL (ref 12.0–15.0)
Immature Granulocytes: 0 %
Lymphocytes Relative: 43 %
Lymphs Abs: 2.5 10*3/uL (ref 0.7–4.0)
MCH: 31.8 pg (ref 26.0–34.0)
MCHC: 34.4 g/dL (ref 30.0–36.0)
MCV: 92.4 fL (ref 80.0–100.0)
Monocytes Absolute: 0.8 10*3/uL (ref 0.1–1.0)
Monocytes Relative: 14 %
Neutro Abs: 2.4 10*3/uL (ref 1.7–7.7)
Neutrophils Relative %: 41 %
Platelets: 71 10*3/uL — ABNORMAL LOW (ref 150–400)
RBC: 4.72 MIL/uL (ref 3.87–5.11)
RDW: 12.2 % (ref 11.5–15.5)
WBC: 5.9 10*3/uL (ref 4.0–10.5)
nRBC: 0 % (ref 0.0–0.2)

## 2020-11-21 MED ORDER — ROMIPLOSTIM 125 MCG ~~LOC~~ SOLR
1.0000 ug/kg | SUBCUTANEOUS | Status: DC
  Administered 2020-11-21: 50 ug via SUBCUTANEOUS
  Filled 2020-11-21: qty 0.1

## 2020-11-21 MED ORDER — ELTROMBOPAG OLAMINE 12.5 MG PO TABS: 12.5000 mg | ORAL_TABLET | Freq: Every day | ORAL | 1 refills | Status: DC

## 2020-11-21 NOTE — Telephone Encounter (Signed)
Oral Oncology Patient Advocate Encounter  Prior Authorization for Selena Branch has been approved.    PA# [id number] Effective dates: 11/21/20 through 11/21/21  Patients co-pay is $294  Oral Oncology Clinic will continue to follow.   [place] Patient [place] Phone [phone] Fax [phone] 11/21/2020 12:53 PM

## 2020-11-21 NOTE — Telephone Encounter (Signed)
Oral Oncology Patient Advocate Encounter  Received notification from [employer] that prior authorization for Promacta is required.  PA submitted on CoverMyMeds Key [id number] Status is pending  Oral Oncology Clinic will continue to follow.  [place] Patient [place] Phone [phone] Fax [phone] 11/21/2020 11:40 AM

## 2020-11-21 NOTE — Patient Instructions (Signed)
Romiplostim injection What is this medicine? ROMIPLOSTIM (roe mi PLOE stim) helps your body make more platelets. This medicine is used to treat low platelets caused by chronic idiopathic thrombocytopenic purpura (ITP) or a bone marrow syndrome caused by radiation sickness. This medicine may be used for other purposes; ask your health care provider or pharmacist if you have questions. COMMON BRAND NAME(S): Nplate What should I tell my health care provider before I take this medicine? They need to know if you have any of these conditions:  blood clots  myelodysplastic syndrome  an unusual or allergic reaction to romiplostim, mannitol, other medicines, foods, dyes, or preservatives  pregnant or trying to get pregnant  breast-feeding How should I use this medicine? This medicine is injected under the skin. It is given by a health care provider in a hospital or clinic setting. A special MedGuide will be given to you before each treatment. Be sure to read this information carefully each time. Talk to your health care provider about the use of this medicine in children. While it may be prescribed for children as young as newborns for selected conditions, precautions do apply. Overdosage: If you think you have taken too much of this medicine contact a poison control center or emergency room at once. NOTE: This medicine is only for you. Do not share this medicine with others. What if I miss a dose? Keep appointments for follow-up doses. It is important not to miss your dose. Call your health care provider if you are unable to keep an appointment. What may interact with this medicine? Interactions are not expected. This list may not describe all possible interactions. Give your health care provider a list of all the medicines, herbs, non-prescription drugs, or dietary supplements you use. Also tell them if you smoke, drink alcohol, or use illegal drugs. Some items may interact with your  medicine. What should I watch for while using this medicine? Visit your health care provider for regular checks on your progress. You may need blood work done while you are taking this medicine. Your condition will be monitored carefully while you are receiving this medicine. It is important not to miss any appointments. What side effects may I notice from receiving this medicine? Side effects that you should report to your doctor or health care professional as soon as possible:  allergic reactions (skin rash, itching or hives; swelling of the face, lips, or tongue)  bleeding (bloody or black, tarry stools; red or dark brown urine; spitting up blood or brown material that looks like coffee grounds; red spots on the skin; unusual bruising or bleeding from the eyes, gums, or nose)  blood clot (chest pain; shortness of breath; pain, swelling, or warmth in the leg)  stroke (changes in vision; confusion; trouble speaking or understanding; severe headaches; sudden numbness or weakness of the face, arm or leg; trouble walking; dizziness; loss of balance or coordination) Side effects that usually do not require medical attention (report to your doctor or health care professional if they continue or are bothersome):  diarrhea  dizziness  headache  joint pain  muscle pain  stomach pain  trouble sleeping This list may not describe all possible side effects. Call your doctor for medical advice about side effects. You may report side effects to FDA at 1-800-FDA-1088. Where should I keep my medicine? This medicine is given in a hospital or clinic. It will not be stored at home. NOTE: This sheet is a summary. It may not cover all possible   information. If you have questions about this medicine, talk to your doctor, pharmacist, or health care provider.  2021 Elsevier/Gold Standard (2019-10-15 10:28:13)  

## 2020-11-24 ENCOUNTER — Telehealth: Payer: Self-pay | Admitting: Hematology

## 2020-11-24 NOTE — Telephone Encounter (Signed)
Oral Oncology Pharmacist Encounter  Received new prescription for Promacta (eltrombopag) for the treatment of ITP, planned duration until stabilization of platelet count.  Prescription dose and frequency assessed for appropriateness.  CMP from 11/14/20 and CBC w/ Diff from 11/21/20 assessed, noted pltc of 71 K/uL - patient received 50 mcg Nplate on 3/74/82, repeat labs scheduled on 11/28/20.  Current medication list in Epic reviewed, DDIs with Promacta identified:  Category D DDI between Promacta and calcium carbonate as well as multivitamin - Promacta will need to be administered at least 2 hours before or 4 hours after polyvalent cation containing products due to risk of decreased efficacy of Promacta if not spaced out from calcium carbonate/vitamin administration.   Category C DDI between Promacta and atorvastatin - Promacta may increase serum concentrations of BCRP/ABCG2 substrates and OATP1B1, which may increase risk of ADEs of atorvastatin. Recommend monitoring for increased ADEs from atorvastatin - no change in therapy recommended at this time.   Evaluated chart and no patient barriers to medication adherence noted.   Prescription has been e-scribed to the [facility] for benefits analysis and approval.  Oral Oncology Clinic will continue to follow for insurance authorization, copayment issues, initial counseling and start date.  Leron Croak, PharmD, BCPS Hematology/Oncology Clinical Pharmacist [facility] [phone] 11/24/2020 8:48 AM

## 2020-11-24 NOTE — Telephone Encounter (Signed)
Added provider visit per 3/11 los. Attempted to contact patient; dial tone. Mailed calendar.

## 2020-11-25 ENCOUNTER — Telehealth: Payer: Self-pay

## 2020-11-25 NOTE — Telephone Encounter (Signed)
Oral Oncology Patient Advocate Encounter  Met patient in [place] to complete an application for [employer] Patient [place] (NPAF) in an effort to reduce the patient's out of pocket expense for Promacta to $0.    Application completed and faxed to [phone].   NPAF phone number for follow up is [phone].   This encounter will be updated until final determination.   [place] Patient [place] Phone [phone] Fax [phone] 11/25/2020 8:28 AM

## 2020-11-26 NOTE — Telephone Encounter (Signed)
Oral Chemotherapy Pharmacist Encounter  I spoke with patient for overview of: Promacta (eltrombopag) for the treatment of ITP, planned duration until disease progression or unacceptable drug toxicity.   Counseled patient on administration, dosing, side effects, monitoring, drug-food interactions, safe handling, storage, and disposal.  Patient will take Promacta 12.5mg  tablets, 1 tablet by mouth once daily on an empty stomach, 1 hour before or 2 hours after a meal.  Patient counseled to administer Promacta at least 2 hours before, or 4 hours after antacids, foods high in calcium, or vitamin supplements (iron, calcium, aluminum, magnesium, selinium, zinc).  Promacta start date: 11/27/20  Adverse effects include but are not limited to: fatigue, diarrhea, nausea, pruritis, decreased blood counts, hepatotoxicity, flu-like symptoms, myalgias, fever, and peripheral edema.    Reviewed with patient importance of keeping a medication schedule and plan for any missed doses. No barriers to medication adherence identified.  Medication reconciliation performed and medication/allergy list updated.  Insurance authorization for Antony Blackbird has been obtained. Test claim at the pharmacy revealed copayment $294 for 1st fill of Promacta. Free 30-day trial card utilized for first fill of Promacta while applying for manufacturer assistance to help patient receive medication at no cost. Patient will pick this up from the [facility] on 11/27/20.   Patient informed the pharmacy will reach out 5-7 days prior to needing next fill of Promacta to coordinate continued medication acquisition to prevent break in therapy.  All questions answered.  Ms. Corter voiced understanding and appreciation.   Medication education handout placed in mail for patient. Patient knows to call the office with questions or concerns. Oral Chemotherapy Clinic phone number provided to patient.   Leron Croak, PharmD,  BCPS Hematology/Oncology Clinical Pharmacist [facility] [phone] 11/26/2020 1:16 PM

## 2020-11-27 ENCOUNTER — Telehealth: Payer: Self-pay | Admitting: *Deleted

## 2020-11-27 NOTE — Telephone Encounter (Addendum)
Per Dr. Irene Limbo - patient has change in treatment. Started Promacta 11/27/20. She will stop NPlate injections. Injection appts to be cancelled.   Per Dr. Irene Limbo -  labs in 2 weeks after starting Promacta and RTC with labs and MD visit in 4 weeks. thanks  Schedule message sent for labs on 3/31, 2  weeks after starting Promacta. Lab/MD appts on 4/14,  4 weeks after start.  Contacted patient with this information, she verbalized understanding that all appts on 3/18 and 3/25 are being cancelllded and that scheduling will contact her with appts

## 2020-11-28 ENCOUNTER — Inpatient Hospital Stay: Payer: PPO

## 2020-11-28 ENCOUNTER — Telehealth: Payer: Self-pay | Admitting: Hematology

## 2020-11-28 NOTE — Telephone Encounter (Signed)
Received call from pt. requesting that the planned 4/14 Lab/MD appt. be moved to 4/15 instead because her daughter will not be available on 4/14.  Scheduling message sent.

## 2020-11-28 NOTE — Telephone Encounter (Signed)
Scheduled per 03/18 scheduled message, patient has been called and notified.

## 2020-12-01 ENCOUNTER — Telehealth: Payer: Self-pay | Admitting: Hematology

## 2020-12-01 ENCOUNTER — Other Ambulatory Visit: Payer: Self-pay

## 2020-12-01 ENCOUNTER — Encounter (INDEPENDENT_AMBULATORY_CARE_PROVIDER_SITE_OTHER): Payer: Self-pay

## 2020-12-01 ENCOUNTER — Encounter (HOSPITAL_COMMUNITY): Payer: Self-pay

## 2020-12-01 ENCOUNTER — Ambulatory Visit (HOSPITAL_COMMUNITY)
Admission: RE | Admit: 2020-12-01 | Discharge: 2020-12-01 | Disposition: A | Discharge status: Home / Self Care | Payer: PPO | Source: Ambulatory Visit | Attending: Hematology | Admitting: Hematology

## 2020-12-01 DIAGNOSIS — I2510 Atherosclerotic heart disease of native coronary artery without angina pectoris: Secondary | ICD-10-CM | POA: Diagnosis not present

## 2020-12-01 DIAGNOSIS — M47814 Spondylosis without myelopathy or radiculopathy, thoracic region: Secondary | ICD-10-CM | POA: Diagnosis not present

## 2020-12-01 DIAGNOSIS — C801 Malignant (primary) neoplasm, unspecified: Secondary | ICD-10-CM | POA: Diagnosis not present

## 2020-12-01 DIAGNOSIS — K449 Diaphragmatic hernia without obstruction or gangrene: Secondary | ICD-10-CM | POA: Diagnosis not present

## 2020-12-01 DIAGNOSIS — K5730 Diverticulosis of large intestine without perforation or abscess without bleeding: Secondary | ICD-10-CM | POA: Diagnosis not present

## 2020-12-01 DIAGNOSIS — C9110 Chronic lymphocytic leukemia of B-cell type not having achieved remission: Secondary | ICD-10-CM | POA: Diagnosis not present

## 2020-12-01 DIAGNOSIS — N838 Other noninflammatory disorders of ovary, fallopian tube and broad ligament: Secondary | ICD-10-CM | POA: Diagnosis not present

## 2020-12-01 MED ORDER — IOHEXOL 300 MG/ML  SOLN
100.0000 mL | Freq: Once | INTRAMUSCULAR | Status: AC | PRN
  Administered 2020-12-01: 100 mL via INTRAVENOUS

## 2020-12-01 NOTE — Telephone Encounter (Signed)
Scheduled per 03/18 scheduled message, patient has been called and voicemail was left.

## 2020-12-02 NOTE — Telephone Encounter (Signed)
Patient is approved for Promacta at no cost from [employer] 12/01/20-09/12/21.  [employer] uses [facility] by [place] Patient [place] Phone [phone] Fax [phone] 12/02/2020 8:58 AM

## 2020-12-04 DIAGNOSIS — H903 Sensorineural hearing loss, bilateral: Secondary | ICD-10-CM | POA: Diagnosis not present

## 2020-12-05 ENCOUNTER — Ambulatory Visit: Payer: PPO | Admitting: Hematology

## 2020-12-05 ENCOUNTER — Ambulatory Visit: Payer: PPO

## 2020-12-05 ENCOUNTER — Other Ambulatory Visit: Payer: PPO

## 2020-12-08 ENCOUNTER — Telehealth: Payer: Self-pay

## 2020-12-08 NOTE — Telephone Encounter (Signed)
Oral Oncology Patient Advocate Encounter   Was successful in securing patient an $35 grant from Patient [place] [facility]) to provide copayment coverage for Venclexta.  This will keep the out of pocket expense at $0.     I have spoken with the patient.    The billing information is as follows and has been shared with [place].   Member ID: [id number] Group ID: [id number] RxBin: [id number] Dates of Eligibility: 12/03/20 through 12/02/21  Fund:  [place] Patient [place] Phone [phone] Fax [phone] 12/08/2020 1:35 PM

## 2020-12-09 ENCOUNTER — Other Ambulatory Visit (HOSPITAL_COMMUNITY): Payer: Self-pay

## 2020-12-11 ENCOUNTER — Other Ambulatory Visit: Payer: Self-pay | Admitting: Hematology

## 2020-12-11 ENCOUNTER — Inpatient Hospital Stay: Payer: PPO

## 2020-12-11 ENCOUNTER — Telehealth: Payer: Self-pay

## 2020-12-11 ENCOUNTER — Other Ambulatory Visit: Payer: Self-pay

## 2020-12-11 VITALS — BP 158/83 | HR 73 | Temp 97.8°F | Resp 16

## 2020-12-11 DIAGNOSIS — C9110 Chronic lymphocytic leukemia of B-cell type not having achieved remission: Secondary | ICD-10-CM

## 2020-12-11 DIAGNOSIS — D693 Immune thrombocytopenic purpura: Secondary | ICD-10-CM

## 2020-12-11 DIAGNOSIS — I629 Nontraumatic intracranial hemorrhage, unspecified: Secondary | ICD-10-CM

## 2020-12-11 LAB — CBC WITH DIFFERENTIAL/PLATELET
Abs Immature Granulocytes: 0.01 10*3/uL (ref 0.00–0.07)
Basophils Absolute: 0 10*3/uL (ref 0.0–0.1)
Basophils Relative: 0 %
Eosinophils Absolute: 0.1 10*3/uL (ref 0.0–0.5)
Eosinophils Relative: 1 %
HCT: 43.2 % (ref 36.0–46.0)
Hemoglobin: 14.7 g/dL (ref 12.0–15.0)
Immature Granulocytes: 0 %
Lymphocytes Relative: 38 %
Lymphs Abs: 2.8 10*3/uL (ref 0.7–4.0)
MCH: 32.2 pg (ref 26.0–34.0)
MCHC: 34 g/dL (ref 30.0–36.0)
MCV: 94.5 fL (ref 80.0–100.0)
Monocytes Absolute: 0.8 10*3/uL (ref 0.1–1.0)
Monocytes Relative: 10 %
Neutro Abs: 3.7 10*3/uL (ref 1.7–7.7)
Neutrophils Relative %: 51 %
Platelets: 23 10*3/uL — ABNORMAL LOW (ref 150–400)
RBC: 4.57 MIL/uL (ref 3.87–5.11)
RDW: 12.4 % (ref 11.5–15.5)
WBC: 7.3 10*3/uL (ref 4.0–10.5)
nRBC: 0 % (ref 0.0–0.2)

## 2020-12-11 LAB — CMP (CANCER CENTER ONLY)
ALT: 9 U/L (ref 0–44)
AST: 18 U/L (ref 15–41)
Albumin: 4.3 g/dL (ref 3.5–5.0)
Alkaline Phosphatase: 85 U/L (ref 38–126)
Anion gap: 10 (ref 5–15)
BUN: 14 mg/dL (ref 8–23)
CO2: 29 mmol/L (ref 22–32)
Calcium: 9.2 mg/dL (ref 8.9–10.3)
Chloride: 104 mmol/L (ref 98–111)
Creatinine: 0.8 mg/dL (ref 0.44–1.00)
GFR, Estimated: >60 mL/min (ref >60)
Glucose, Bld: 97 mg/dL (ref 70–99)
Potassium: 4.3 mmol/L (ref 3.5–5.1)
Sodium: 143 mmol/L (ref 135–145)
Total Bilirubin: 0.4 mg/dL (ref 0.3–1.2)
Total Protein: 6.7 g/dL (ref 6.5–8.1)

## 2020-12-11 LAB — IMMATURE PLATELET FRACTION: Immature Platelet Fraction: 5.4 % (ref 1.2–8.6)

## 2020-12-11 MED ORDER — PREDNISONE 20 MG PO TABS: 20.0000 mg | ORAL_TABLET | Freq: Every day | ORAL | 0 refills | Status: DC

## 2020-12-11 MED ORDER — ROMIPLOSTIM 125 MCG ~~LOC~~ SOLR: 1.0000 ug/kg | SUBCUTANEOUS | Status: DC

## 2020-12-11 NOTE — Telephone Encounter (Signed)
Contacted pt per Dr Irene Limbo to let her know her platelets are low. Pt is to come in tomorrow 12/12/20 at 2:00pm for lab draw and Nplate injection. Pt verbalized understanding.  Pt instructed to increase Promacta dose : today pt to take 4 (12.5mg ) tablets and then starting tomorrow pt to take 2 (12.5) mg tablets daily. Pt repeated back dose and instructions to RN to verbalize her understanding.

## 2020-12-12 ENCOUNTER — Inpatient Hospital Stay: Payer: PPO | Attending: Hematology

## 2020-12-12 ENCOUNTER — Inpatient Hospital Stay: Payer: PPO

## 2020-12-12 VITALS — BP 145/85 | HR 67 | Temp 98.3°F | Resp 18

## 2020-12-12 DIAGNOSIS — R140 Abdominal distension (gaseous): Secondary | ICD-10-CM | POA: Diagnosis not present

## 2020-12-12 DIAGNOSIS — N83202 Unspecified ovarian cyst, left side: Secondary | ICD-10-CM | POA: Diagnosis not present

## 2020-12-12 DIAGNOSIS — D693 Immune thrombocytopenic purpura: Secondary | ICD-10-CM | POA: Insufficient documentation

## 2020-12-12 DIAGNOSIS — Z79899 Other long term (current) drug therapy: Secondary | ICD-10-CM | POA: Diagnosis not present

## 2020-12-12 DIAGNOSIS — D649 Anemia, unspecified: Secondary | ICD-10-CM | POA: Diagnosis not present

## 2020-12-12 DIAGNOSIS — C9110 Chronic lymphocytic leukemia of B-cell type not having achieved remission: Secondary | ICD-10-CM | POA: Insufficient documentation

## 2020-12-12 DIAGNOSIS — I629 Nontraumatic intracranial hemorrhage, unspecified: Secondary | ICD-10-CM

## 2020-12-12 LAB — CBC WITH DIFFERENTIAL (CANCER CENTER ONLY)
Abs Immature Granulocytes: 0.01 10*3/uL (ref 0.00–0.07)
Basophils Absolute: 0 10*3/uL (ref 0.0–0.1)
Basophils Relative: 1 %
Eosinophils Absolute: 0 10*3/uL (ref 0.0–0.5)
Eosinophils Relative: 1 %
HCT: 41.2 % (ref 36.0–46.0)
Hemoglobin: 14.1 g/dL (ref 12.0–15.0)
Immature Granulocytes: 0 %
Lymphocytes Relative: 47 %
Lymphs Abs: 3.1 10*3/uL (ref 0.7–4.0)
MCH: 31.8 pg (ref 26.0–34.0)
MCHC: 34.2 g/dL (ref 30.0–36.0)
MCV: 92.8 fL (ref 80.0–100.0)
Monocytes Absolute: 0.7 10*3/uL (ref 0.1–1.0)
Monocytes Relative: 11 %
Neutro Abs: 2.6 10*3/uL (ref 1.7–7.7)
Neutrophils Relative %: 40 %
Platelet Count: 29 10*3/uL — ABNORMAL LOW (ref 150–400)
RBC: 4.44 MIL/uL (ref 3.87–5.11)
RDW: 12.2 % (ref 11.5–15.5)
WBC Count: 6.4 10*3/uL (ref 4.0–10.5)
nRBC: 0 % (ref 0.0–0.2)

## 2020-12-12 MED ORDER — ROMIPLOSTIM 250 MCG ~~LOC~~ SOLR
210.0000 ug | SUBCUTANEOUS | Status: DC
  Administered 2020-12-12: 210 ug via SUBCUTANEOUS
  Filled 2020-12-12: qty 0.42

## 2020-12-12 NOTE — Patient Instructions (Signed)
Romiplostim injection What is this medicine? ROMIPLOSTIM (roe mi PLOE stim) helps your body make more platelets. This medicine is used to treat low platelets caused by chronic idiopathic thrombocytopenic purpura (ITP) or a bone marrow syndrome caused by radiation sickness. This medicine may be used for other purposes; ask your health care provider or pharmacist if you have questions. COMMON BRAND NAME(S): Nplate What should I tell my health care provider before I take this medicine? They need to know if you have any of these conditions:  blood clots  myelodysplastic syndrome  an unusual or allergic reaction to romiplostim, mannitol, other medicines, foods, dyes, or preservatives  pregnant or trying to get pregnant  breast-feeding How should I use this medicine? This medicine is injected under the skin. It is given by a health care provider in a hospital or clinic setting. A special MedGuide will be given to you before each treatment. Be sure to read this information carefully each time. Talk to your health care provider about the use of this medicine in children. While it may be prescribed for children as young as newborns for selected conditions, precautions do apply. Overdosage: If you think you have taken too much of this medicine contact a poison control center or emergency room at once. NOTE: This medicine is only for you. Do not share this medicine with others. What if I miss a dose? Keep appointments for follow-up doses. It is important not to miss your dose. Call your health care provider if you are unable to keep an appointment. What may interact with this medicine? Interactions are not expected. This list may not describe all possible interactions. Give your health care provider a list of all the medicines, herbs, non-prescription drugs, or dietary supplements you use. Also tell them if you smoke, drink alcohol, or use illegal drugs. Some items may interact with your  medicine. What should I watch for while using this medicine? Visit your health care provider for regular checks on your progress. You may need blood work done while you are taking this medicine. Your condition will be monitored carefully while you are receiving this medicine. It is important not to miss any appointments. What side effects may I notice from receiving this medicine? Side effects that you should report to your doctor or health care professional as soon as possible:  allergic reactions (skin rash, itching or hives; swelling of the face, lips, or tongue)  bleeding (bloody or black, tarry stools; red or dark brown urine; spitting up blood or brown material that looks like coffee grounds; red spots on the skin; unusual bruising or bleeding from the eyes, gums, or nose)  blood clot (chest pain; shortness of breath; pain, swelling, or warmth in the leg)  stroke (changes in vision; confusion; trouble speaking or understanding; severe headaches; sudden numbness or weakness of the face, arm or leg; trouble walking; dizziness; loss of balance or coordination) Side effects that usually do not require medical attention (report to your doctor or health care professional if they continue or are bothersome):  diarrhea  dizziness  headache  joint pain  muscle pain  stomach pain  trouble sleeping This list may not describe all possible side effects. Call your doctor for medical advice about side effects. You may report side effects to FDA at 1-800-FDA-1088. Where should I keep my medicine? This medicine is given in a hospital or clinic. It will not be stored at home. NOTE: This sheet is a summary. It may not cover all possible   information. If you have questions about this medicine, talk to your doctor, pharmacist, or health care provider.  2021 Elsevier/Gold Standard (2019-10-15 10:28:13)  

## 2020-12-12 NOTE — Progress Notes (Signed)
Pltc = 29 today. Nplate most recently given on 11/21/20 - 1 mcg/kg.  Per Dr. Irene Limbo will incr Nplate to 2 mcg/kg today = 210 mcg.  Kennith Center, Pharm.D., CPP 12/12/2020@2 :31 PM

## 2020-12-24 DIAGNOSIS — H903 Sensorineural hearing loss, bilateral: Secondary | ICD-10-CM | POA: Diagnosis not present

## 2020-12-24 DIAGNOSIS — H6122 Impacted cerumen, left ear: Secondary | ICD-10-CM | POA: Diagnosis not present

## 2020-12-24 NOTE — Progress Notes (Addendum)
HEMATOLOGY/ONCOLOGY CLINIC NOTE  Date of Service: 12/25/2020  Patient Care Team: [name], MD as PCP - General (Family Medicine) [name], MD as Consulting Physician (Hematology)  CHIEF COMPLAINTS/PURPOSE OF CONSULTATION:  F/u for CLL and ITP  HISTORY OF PRESENTING ILLNESS:   Selena Branch is a wonderful 80 y.o. female who has been transferred to [place] from Dr. Mathis Dad Higgs for evaluation and management of CLL. The pt reports that she is doing well overall.  The pt reports that has been slightly fatigued in the past 1-2 months, but thinks it is from the heat. The fatigue does not affect her ability to function. Denies fevers, chills, and night sweats. She reports that she saw her PCP in August, who noticed that her blood counts were high and referred her to Dr. Irene Limbo.  Lab results today (05/31/2019) of CBC w diff and CMP is as follows: all values are WNL except for WBC at 39.1k, neutro abs at 1.6k, lymphs abs at 36.8k, monocytes abs at 0.0k, eosinophils abs at 0.8k, BUN at 27, Total protein at 6.4 05/31/2019 LDH is pending  On review of systems, pt reports mild fatigue and denies belly pain, infection issues and any other symptoms.   INTERVAL HISTORY:  Selena Branch is a 80 y.o. 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 The patient's last visit with [place] was on 11/21/2020. The pt reports that she is doing well overall. We are joined today by her husband.  The pt reports no new symptoms or concerns. The pt notes some mild fatigue but this is not bothersome nor interferes with her way of life. The pt notes she still has some feelings of being bloated.  Of note since the patient's last visit, pt has had CT C/A/P on 12/01/2020, which revealed "1. No pathologically enlarged lymph nodes within the chest, abdomen, or pelvis. Previously indexed pelvic lymph nodes are now resolved or too small to accurately characterize. 2. Increased size of the left adnexal cyst which now  measures 6 cm. [place] is recommended for further evaluation. Note: This recommendation does not apply to premenarchal patients and to those with increased risk (genetic, family history, elevated tumor markers or other high-risk factors) of ovarian cancer. Reference: JACR 2020 Feb; 17(2):248-254 3. Gas fluid levels throughout the colon suggestive of diarrheal illness. 4. Sigmoid colonic diverticulosis without findings of acute diverticulitis. 5. Three-vessel coronary artery calcifications. Aortic Atherosclerosis."  Lab results today 12/25/2020 of CBC w/diff is as follows: all values are WNL.  On review of systems, pt reports bloating, fatigue and denies diarrhea, constipation, changes in bowel habits, abdominal pain, vaginal discharge/bleeding, acute skin rashes, leg swelling, and any other symptoms.  MEDICAL HISTORY:  Past Medical History:  Diagnosis Date  . Anxiety   . 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 Glaucoma   . Hyperlipemia   . Hypertension   . Osteoporosis     SURGICAL HISTORY: No past surgical history on file.  SOCIAL HISTORY: Social History   Socioeconomic History  . Marital status: Married    Spouse name: Not on file  . Number of children: 1  . Years of education: HS  . Highest education level: Not on file  Occupational History  . Occupation: Retired  Tobacco Use  . Smoking status: Never Smoker  . Smokeless tobacco: Never Used  Substance and Sexual Activity  . Alcohol use: No    Alcohol/week: 0.0 standard drinks  . Drug use: No  . Sexual  activity: Not on file  Other Topics Concern  . Not on file  Social History Narrative   Drinks 2 cups of coffee   Social Determinants of Health   Financial Resource Strain: Not on file  Food Insecurity: Not on file  Transportation Needs: Not on file  Physical Activity: Not on file  Stress: Not on file  Social Connections: Not on file  Intimate Partner Violence: Not on file    FAMILY HISTORY: Family  History  Problem Relation Age of Onset  . Stroke Mother   . Lung cancer Father   . Leukemia Neg Hx     ALLERGIES:  is allergic to codeine, evista [raloxifene], ibandronic acid, and other.  MEDICATIONS:  Current Outpatient Medications  Medication Sig Dispense Refill  . amLODipine (NORVASC) 10 MG tablet Take 1 tablet (10 mg total) by mouth at bedtime. 30 tablet 0  . atorvastatin (LIPITOR) 40 MG tablet Take 40 mg by mouth daily.     . Brinzolamide-Brimonidine (SIMBRINZA) 1-0.2 % SUSP Place 1 drop into both eyes daily at 12 noon.    . Calcium Carbonate-Vitamin D (CALCIUM-D PO) Take 1 tablet by mouth daily.    . Cholecalciferol (VITAMIN D3) 2000 units TABS Take 2,000 Units by mouth daily.     . dorzolamide-timolol (COSOPT) 22.3-6.8 MG/ML ophthalmic solution Place 1 drop into both eyes daily.     Marland Kitchen eltrombopag (PROMACTA) 12.5 MG tablet TAKE 1 TABLET (12.5 MG TOTAL) BY MOUTH DAILY. TAKE ON AN EMPTY STOMACH, 1 HOUR BEFORE A MEAL OR 2 HOURS AFTER. 30 tablet 1  . furosemide (LASIX) 20 MG tablet Take 40 mg for 3 days then resume your 20 mg daily (Patient taking differently: Take 20 mg by mouth daily. ) 30 tablet   . latanoprost (XALATAN) 0.005 % ophthalmic solution Place 1 drop into both eyes at bedtime.     . Multiple Vitamin (MULTIVITAMIN WITH MINERALS) TABS tablet Take 1 tablet by mouth daily.    . predniSONE (DELTASONE) 20 MG tablet Take 1 tablet (20 mg total) by mouth daily with breakfast. 5 tablet 0  . quinapril (ACCUPRIL) 40 MG tablet Take 40 mg by mouth daily.     Marland Kitchen venetoclax (VENCLEXTA) 100 MG tablet TAKE 2 TABLETS (200 MG) BY MOUTH DAILY. 60 tablet 2   No current facility-administered medications for this visit.    REVIEW OF SYSTEMS:   10 Point review of Systems was done is negative except as noted above.   PHYSICAL EXAMINATION:  ECOG FS:2 - Symptomatic, <50% confined to bed  Vitals:   12/25/20 1016  BP: (!) 150/63  Pulse: 62  Resp: 17  Temp: (!) 96.5 F (35.8 C)  SpO2: 98%    Wt Readings from Last 3 Encounters:  12/25/20 106 lb 9.6 oz (48.4 kg)  11/21/20 105 lb 12.8 oz (48 kg)  08/18/20 105 lb 8 oz (47.9 kg)   Body mass index is 22.28 kg/m.    NAD. GENERAL:alert, in no acute distress and comfortable SKIN: no acute rashes, no significant lesions EYES: conjunctiva are pink and non-injected, sclera anicteric OROPHARYNX: MMM, no exudates, no oropharyngeal erythema or ulceration NECK: supple, no JVD LYMPH:  no palpable lymphadenopathy in the cervical, axillary or inguinal regions LUNGS: clear to auscultation b/l with normal respiratory effort HEART: regular rate & rhythm ABDOMEN:  normoactive bowel sounds , non tender, not distended. Extremity: no pedal edema PSYCH: alert & oriented x 3 with fluent speech NEURO: no focal motor/sensory deficits   LABORATORY DATA:  I have reviewed the data as listed  . CBC Latest Ref Rng & Units 12/25/2020 12/12/2020 12/11/2020  WBC 4.0 - 10.5 K/uL 5.5 6.4 7.3  Hemoglobin 12.0 - 15.0 g/dL 13.9 14.1 14.7  Hematocrit 36.0 - 46.0 % 40.0 41.2 43.2  Platelets 150 - 400 K/uL 226 29(L) 23(L)    . CMP Latest Ref Rng & Units 12/11/2020 11/14/2020 11/07/2020  Glucose 70 - 99 mg/dL 97 102(H) 90  BUN 8 - 23 mg/dL 14 19 21   Creatinine 0.44 - 1.00 mg/dL 0.80 0.88 0.77  Sodium 135 - 145 mmol/L 143 146(H) 145  Potassium 3.5 - 5.1 mmol/L 4.3 4.9 4.7  Chloride 98 - 111 mmol/L 104 108 109  CO2 22 - 32 mmol/L 29 29 29   Calcium 8.9 - 10.3 mg/dL 9.2 10.1 9.8  Total Protein 6.5 - 8.1 g/dL 6.7 7.2 6.7  Total Bilirubin 0.3 - 1.2 mg/dL 0.4 0.5 0.4  Alkaline Phos 38 - 126 U/L 85 101 93  AST 15 - 41 U/L 18 23 20   ALT 0 - 44 U/L 9 13 12    02/27/2019 FISH:    RADIOGRAPHIC STUDIES: I have personally reviewed the radiological images as listed and agreed with the findings in the report. CT CHEST ABDOMEN PELVIS W CONTRAST  Result Date: 12/02/2020 CLINICAL DATA:  Hematologic malignancy, assess treatment response evaluate response to treatment  of CLL/SLL, currently ongoing oral chemotherapy EXAM: CT CHEST, ABDOMEN, AND PELVIS WITH CONTRAST TECHNIQUE: Multidetector CT imaging of the chest, abdomen and pelvis was performed following the standard protocol during bolus administration of intravenous contrast. CONTRAST:  131mL OMNIPAQUE IOHEXOL 300 MG/ML  SOLN COMPARISON:  CT chest abdomen and pelvis August 31, 2019 for FINDINGS: CT CHEST FINDINGS Cardiovascular: Aortic compressed vessel atherosclerosis. No thoracic aortic aneurysm. No central pulmonary embolus. Heart size within normal limits. No significant pericardial effusion/thickening. Three-vessel coronary artery calcifications. Mediastinum/Nodes: Thyroid gland is grossly unremarkable without discrete nodularity. No pathologically enlarged mediastinal, hilar or axillary lymph nodes. Small hiatal hernia. Trachea is grossly unremarkable. Lungs/Pleura: No pneumothorax. No pleural effusion. Again seen are a few scattered perifissural and subpleural solid pulmonary nodules for instance there is a 5 mm anterior right lower lobe nodule which is stable in size dating back to chest CT 2011, these are considered benign nodules. No new or enlarging suspicious pulmonary nodules. Musculoskeletal: No aggressive lytic or blastic lesions of bone. Mild thoracic spondylosis. Similar appearance of the chronic mild posterior T10 vertebral body compression deformity. CT ABDOMEN PELVIS FINDINGS Hepatobiliary: No suspicious hepatic lesion. The gallbladder is grossly unremarkable. No biliary ductal dilation. Pancreas: Within normal limits. Spleen: Normal in size without focal abnormality. Adrenals/Urinary Tract: Adrenal glands are unremarkable. Kidneys are normal, without renal calculi, focal lesion, or hydronephrosis. Bladder is unremarkable. Stomach/Bowel: Small hiatal hernia, otherwise unremarkable appearance of a nondistended stomach. Normal positioning of the duodenum/ligament of [place]. No suspicious small bowel wall  thickening or dilation. Oral contrast traverses descending colon. Sigmoid colonic diverticulosis without findings of acute diverticulitis. Vascular/Lymphatic: Aortic atherosclerosis. No pathologically enlarged abdominal or pelvic lymph nodes. Previously indexed pelvic lymph nodes are now resolved or too small to accurately characterize. Reproductive: Normal uterus. Increased size of the left adnexal cyst which now measures cm in maximum axial dimension on image 86/2, previously 5.3 cm. The right adnexa is unremarkable. Other: No abdominopelvic ascites. Musculoskeletal: Multilevel degenerative change of the spine. No aggressive lytic or blastic lesions of bone. IMPRESSION: 1. No pathologically enlarged lymph nodes within the chest, abdomen, or pelvis. Previously indexed  pelvic lymph nodes are now resolved or too small to accurately characterize. 2. Increased size of the left adnexal cyst which now measures 6 cm. [place] is recommended for further evaluation. Note: This recommendation does not apply to premenarchal patients and to those with increased risk (genetic, family history, elevated tumor markers or other high-risk factors) of ovarian cancer. Reference: JACR 2020 Feb; 17(2):248-254 3. Gas fluid levels throughout the colon suggestive of diarrheal illness. 4. Sigmoid colonic diverticulosis without findings of acute diverticulitis. 5. Three-vessel coronary artery calcifications. Aortic atherosclerosis. Aortic Atherosclerosis (ICD10-I70.0). Electronically Signed   By: Dahlia Bailiff MD   On: 12/02/2020 11:00    ASSESSMENT & PLAN:   #1 CLL Previous CLL prognostic FISH panel without detectable mutations. Has been Rai stage 0.. Now with new anemia and severe thrombocytopenia. WBC counts have increased significantly to 84k from 39k 3 months ago.  Concerning for change in tempo of her disease. -08/31/2019 CT C/A/P ([id number]) ([id number]) revealed "1. Mild multistation lymphadenopathy in the retroperitoneum and  bilateral pelvis as detailed, compatible with lymphoma. Normal size spleen. 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  However cannot rule out bone marrow involvement as an etiology of thrombocytopenia as well.  #3 new mild normocytic anemia.  Hemoglobin 11.7 with an MCV of 97.4. --- now resolved hgb today 14.8 Likely related to her CLL. 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  #4 h/o intraparenchymal bleed- rpt CT head stable (due to uncontrolled HTN and thrombocytopenia)  PLAN: -Discussed pt labwork today, 12/25/2020; all blood counts normal. Plt have improved with one dose Nplate on 09/23. -Discussed pt CT C/A/P on 12/01/2020; no enlarged lymph nodes. Gas in colon but pt is asymptomatic so potentially just baseline. -Recommended pt take OTC Probiotics or yogurt with live cultures daily. -Recommended pt see OBGYN regarding cyst found on scan and recommended ultrasound of that 6 cm cyst. Will send referral. -Discussed Evusheld and pt's eligibility. Will send referral. -Recommended pt receive the second COVID booster shot as recently approved. Advised pt to wait 4-6 months following first booster shot before getting this. -No lab or clinical evidence of CLL progression at this time.  -The pt has no prohibitive toxicities from continuing Promacta at this time. Will continue to monitor with labs and clinical examination. -Will increase to 25 mcg Promacta daily at this time. Will send new prescription to pharmacy to take pill only once daily not BID. -Advised pt that if the Plt levels and other labs are stable are the next visit, we can space out visits further than every month. -Will get US Pelvis in 1-2 weeks. -Will see back in 1 month with labs.   FOLLOW UP: Ambulatory referral for Evusheld Ultrasound pelvis  complete in 1 to 2 weeks Referral to GYN oncology for left adnexal mass Return to clinic with Dr. Irene Limbo with labs in 1 month   The total time spent in the appointment was 30 minutes and more than 50% was on counseling and direct patient cares.   All of the patient's questions were answered with apparent satisfaction. The patient knows to call the clinic with any problems, questions or concerns.   Sullivan Lone MD [place] AAHIVMS [facility] Hematology/Oncology Physician [facility]  (Office):       [phone] (Work cell):  [phone] (Fax):  [phone]  12/25/2020 10:59 AM  I, Reinaldo Raddle, am acting as scribe for Dr. Sullivan Lone, MD.    .I have reviewed the above documentation for accuracy and completeness, and I agree with the above. [name] MD

## 2020-12-25 ENCOUNTER — Other Ambulatory Visit: Payer: Self-pay

## 2020-12-25 ENCOUNTER — Inpatient Hospital Stay: Payer: PPO

## 2020-12-25 ENCOUNTER — Other Ambulatory Visit (HOSPITAL_COMMUNITY): Payer: Self-pay

## 2020-12-25 ENCOUNTER — Inpatient Hospital Stay: Payer: PPO | Admitting: Hematology

## 2020-12-25 VITALS — BP 150/63 | HR 62 | Temp 96.5°F | Resp 17 | Ht <= 58 in | Wt 106.6 lb

## 2020-12-25 DIAGNOSIS — C9110 Chronic lymphocytic leukemia of B-cell type not having achieved remission: Secondary | ICD-10-CM

## 2020-12-25 DIAGNOSIS — D693 Immune thrombocytopenic purpura: Secondary | ICD-10-CM

## 2020-12-25 DIAGNOSIS — N9489 Other specified conditions associated with female genital organs and menstrual cycle: Secondary | ICD-10-CM | POA: Diagnosis not present

## 2020-12-25 LAB — CBC WITH DIFFERENTIAL/PLATELET
Abs Immature Granulocytes: 0.01 10*3/uL (ref 0.00–0.07)
Basophils Absolute: 0 10*3/uL (ref 0.0–0.1)
Basophils Relative: 1 %
Eosinophils Absolute: 0.2 10*3/uL (ref 0.0–0.5)
Eosinophils Relative: 4 %
HCT: 40 % (ref 36.0–46.0)
Hemoglobin: 13.9 g/dL (ref 12.0–15.0)
Immature Granulocytes: 0 %
Lymphocytes Relative: 47 %
Lymphs Abs: 2.6 10*3/uL (ref 0.7–4.0)
MCH: 32.3 pg (ref 26.0–34.0)
MCHC: 34.8 g/dL (ref 30.0–36.0)
MCV: 93 fL (ref 80.0–100.0)
Monocytes Absolute: 0.7 10*3/uL (ref 0.1–1.0)
Monocytes Relative: 13 %
Neutro Abs: 1.9 10*3/uL (ref 1.7–7.7)
Neutrophils Relative %: 35 %
Platelets: 226 10*3/uL (ref 150–400)
RBC: 4.3 MIL/uL (ref 3.87–5.11)
RDW: 12.7 % (ref 11.5–15.5)
WBC: 5.5 10*3/uL (ref 4.0–10.5)
nRBC: 0 % (ref 0.0–0.2)

## 2020-12-25 MED ORDER — ELTROMBOPAG OLAMINE 25 MG PO TABS
25.0000 mg | ORAL_TABLET | Freq: Every day | ORAL | 3 refills | Status: DC
  Filled 2020-12-25: qty 30, 30d supply, fill #0

## 2020-12-30 ENCOUNTER — Other Ambulatory Visit (HOSPITAL_COMMUNITY): Payer: Self-pay

## 2020-12-31 ENCOUNTER — Telehealth: Payer: Self-pay | Admitting: *Deleted

## 2020-12-31 ENCOUNTER — Telehealth: Payer: Self-pay | Admitting: Physician Assistant

## 2020-12-31 NOTE — Telephone Encounter (Signed)
Spoke with the patient and scheduled a new patient appt for 4/29 at 10:30 am with Dr Berline Lopes. Patient given an arrival time of 10 am. Patient aware of the address and phone number for the clinic. Patient also aware of the policy for mask and visitors. Patient scheduled on 4/29 after her [place] on 4/21

## 2020-12-31 NOTE — Telephone Encounter (Signed)
Called pt about Evusheld (tixagevimab co-packaged with cilgavimab) for pre-exposure prophylaxis for prevention of coronavirus disease 2019 (COVID-19) caused by the SARS-CoV-2 virus. The patient is a candidate for this therapy given increased risk for severe disease caused by immunosuppression.   Pt currently not interested in Evusheld but has our hotline number [phone] if she changes her mind.   Angelena Form PA-C  [facility]

## 2021-01-01 ENCOUNTER — Ambulatory Visit (HOSPITAL_COMMUNITY)
Admission: RE | Admit: 2021-01-01 | Discharge: 2021-01-01 | Disposition: A | Discharge status: Home / Self Care | Payer: PPO | Source: Ambulatory Visit | Attending: Hematology | Admitting: Hematology

## 2021-01-01 ENCOUNTER — Other Ambulatory Visit: Payer: Self-pay

## 2021-01-01 DIAGNOSIS — N9489 Other specified conditions associated with female genital organs and menstrual cycle: Secondary | ICD-10-CM | POA: Insufficient documentation

## 2021-01-01 DIAGNOSIS — N83291 Other ovarian cyst, right side: Secondary | ICD-10-CM | POA: Diagnosis not present

## 2021-01-02 ENCOUNTER — Other Ambulatory Visit: Payer: Self-pay

## 2021-01-05 ENCOUNTER — Telehealth: Payer: Self-pay

## 2021-01-05 ENCOUNTER — Other Ambulatory Visit: Payer: Self-pay

## 2021-01-05 MED ORDER — ELTROMBOPAG OLAMINE 25 MG PO TABS: 25.0000 mg | ORAL_TABLET | Freq: Every day | ORAL | 3 refills | Status: DC

## 2021-01-05 NOTE — Telephone Encounter (Signed)
Returned call to pt. Pt needed to give the correct pharmacy information for her promacta prescription. ([facility] [employer]). Prescription refill sent in. Pt acknowledged.

## 2021-01-06 ENCOUNTER — Encounter: Payer: Self-pay | Admitting: Gynecologic Oncology

## 2021-01-09 ENCOUNTER — Other Ambulatory Visit: Payer: Self-pay

## 2021-01-09 ENCOUNTER — Inpatient Hospital Stay (HOSPITAL_BASED_OUTPATIENT_CLINIC_OR_DEPARTMENT_OTHER): Payer: PPO | Admitting: Gynecologic Oncology

## 2021-01-09 ENCOUNTER — Encounter: Payer: Self-pay | Admitting: Gynecologic Oncology

## 2021-01-09 VITALS — BP 161/67 | HR 64 | Temp 97.8°F | Resp 18 | Wt 104.0 lb

## 2021-01-09 DIAGNOSIS — N83202 Unspecified ovarian cyst, left side: Secondary | ICD-10-CM | POA: Diagnosis not present

## 2021-01-09 DIAGNOSIS — N9489 Other specified conditions associated with female genital organs and menstrual cycle: Secondary | ICD-10-CM

## 2021-01-09 DIAGNOSIS — C9110 Chronic lymphocytic leukemia of B-cell type not having achieved remission: Secondary | ICD-10-CM | POA: Diagnosis not present

## 2021-01-09 NOTE — Progress Notes (Signed)
GYNECOLOGIC ONCOLOGY NEW PATIENT CONSULTATION   Patient Name: Selena Branch  Patient Age: 80 y.o. Date of Service: 01/09/21 Referring Provider: Dr. Irene Limbo  Primary Care Provider: [name], MD Consulting Provider: Jeral Pinch, MD   Assessment/Plan:  Postmenopausal patient undergoing treatment for CLL with findings of a simple adnexal cyst.  Patient and I reviewed her CT imaging from 2020 as well as recent pelvic ultrasound.  We discussed imaging findings that raise the concern for a possible malignancy.  I reviewed the limitations of CT scan in terms of assessing ovarian masses.  On ultrasound, her cyst appears simple without concerning features such as septations, excrescences, or mural nodularity.  Additionally, there has been minimal if any increase in size in the cystic mass in approximately a year and a half.  She is completely asymptomatic.  My recommendation is that we could repeat a pelvic ultrasound in 3-6 months.  If the lesion is stable in size and character, the patient continues to be asymptomatic, and there are no imaging features that are concerning, I think that continued surveillance with her CLL directed imaging would be sufficient.  If the mass were to grow, she were to begin having symptoms, or there were features that were concerning for possible malignancy, the patient understands that we may discuss the need for surgical intervention both from a diagnosis and therapeutic standpoint.  All of the patient's questions were answered today.  She was amenable with this plan.  An ultrasound was scheduled for later this year.  We will call her to review the results once available.  A copy of this note was sent to the patient's referring provider.   60 minutes of total time was spent for this patient encounter, including preparation, face-to-face counseling with the patient and coordination of care, and documentation of the encounter.  Jeral Pinch, MD  Division of  Gynecologic Oncology  Department of Obstetrics and Gynecology  [facility]  ___________________________________________  Chief Complaint: Chief Complaint  Patient presents with  . Adnexal mass    History of Present Illness:  Selena Branch is a 80 y.o. y.o. female who is seen in consultation at the request of Dr. Irene Limbo for an evaluation of an adnexal mass.  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  At the end of 2020, she had an adnexal mass noted on her CT scan.  There had been minimal increase in size of this mass on repeat CT scan in March of this year.  He denies any pelvic or abdominal pain.  She endorses a good appetite without nausea or emesis.  She has some bloating related to her CLL medications but notes that this resolves after she takes her medications and usually by mid morning.  She denies any early satiety.  She reports normal bowel and bladder function.  Patient lives in [place] with her husband of almost 59 years.  She is retired.  Treatment History: Oncology History  CLL (chronic lymphocytic leukemia) ([place])  03/07/2019 Initial Diagnosis   CLL (chronic lymphocytic leukemia) ([place])   09/19/2019 -  Chemotherapy   The patient had riTUXimab (RITUXAN) 500 mg in sodium chloride 0.9 % 250 mL (1.6667 mg/mL) infusion, 375 mg/m2 = 500 mg, Intravenous,  Once, 1 of 1 cycle Administration: 500 mg (09/19/2019) riTUXimab-pvvr (RUXIENCE) 500 mg in sodium chloride 0.9 % 250 mL (1.6667 mg/mL) infusion, 375 mg/m2 = 500 mg, Intravenous,  Once, 1 of 1 cycle  for chemotherapy treatment.  PAST MEDICAL HISTORY:  Past Medical History:  Diagnosis Date  . Anxiety   . CLL (chronic lymphocytic leukemia) ([place]) 11/2018  . Glaucoma   . Hyperlipemia   . Hypertension   . Osteoporosis      PAST SURGICAL HISTORY:  History reviewed. No pertinent surgical history.  OB/GYN HISTORY:  OB History  Gravida Para Term  Preterm AB Living  1 1          SAB IAB Ectopic Multiple Live Births               # Outcome Date GA Lbr Len/2nd Weight Sex Delivery Anes PTL Lv  1 Para             No LMP recorded. Patient is postmenopausal.  Age at menarche: 35 Age at menopause: 66, denies any postmenopausal bleeding Hx of HRT: Denies Hx of STDs: Denies Last pap: 2019 per patient (our records indicate that her last one within our system was 2011) History of abnormal pap smears: No  SCREENING STUDIES:  Last mammogram: 2019  Last colonoscopy: 2020 Last bone mineral density: 2020  MEDICATIONS: Outpatient Encounter Medications as of 01/09/2021  Medication Sig  . amLODipine (NORVASC) 10 MG tablet Take 1 tablet (10 mg total) by mouth at bedtime.  Marland Kitchen atorvastatin (LIPITOR) 40 MG tablet Take 40 mg by mouth daily.   . Calcium Carbonate-Vitamin D (CALCIUM-D PO) Take 600 mg by mouth daily.  . Cholecalciferol (VITAMIN D3) 2000 units TABS Take 2,000 Units by mouth daily.   . dorzolamide-timolol (COSOPT) 22.3-6.8 MG/ML ophthalmic solution Place 1 drop into both eyes daily.   Marland Kitchen eltrombopag (PROMACTA) 25 MG tablet Take 1 tablet (25 mg total) by mouth daily. Take on an empty stomach, 1 hour before a meal or 2 hours after.  . furosemide (LASIX) 20 MG tablet Take 40 mg for 3 days then resume your 20 mg daily (Patient taking differently: Take 20 mg by mouth daily.)  . latanoprost (XALATAN) 0.005 % ophthalmic solution Place 1 drop into both eyes at bedtime.   . Multiple Vitamin (MULTIVITAMIN WITH MINERALS) TABS tablet Take 1 tablet by mouth daily.  . quinapril (ACCUPRIL) 40 MG tablet Take 40 mg by mouth daily.   . sertraline (ZOLOFT) 25 MG tablet Take 25 mg by mouth daily.  . Brinzolamide-Brimonidine (SIMBRINZA) 1-0.2 % SUSP Place 1 drop into both eyes daily at 12 noon. (Patient not taking: Reported on 01/06/2021)  . [DISCONTINUED] predniSONE (DELTASONE) 20 MG tablet Take 1 tablet (20 mg total) by mouth daily with breakfast.  .  [DISCONTINUED] venetoclax (VENCLEXTA) 100 MG tablet TAKE 2 TABLETS (200 MG) BY MOUTH DAILY.   No facility-administered encounter medications on file as of 01/09/2021.    ALLERGIES:  Allergies  Allergen Reactions  . Codeine Nausea Only  . Evista [Raloxifene]     Other reaction(s): leg cramps  . Alendronate     Other reaction(s): GI Upset (intolerance)  . Ibandronic Acid Other (See Comments)    Other reaction(s): gi upset, GI Upset (intolerance)  . Risedronate     Other reaction(s): GI Upset (intolerance)     FAMILY HISTORY:  Family History  Problem Relation Age of Onset  . Stroke Mother   . Lung cancer Father   . Leukemia Neg Hx   . Breast cancer Neg Hx   . Ovarian cancer Neg Hx   . Colon cancer Neg Hx   . Endometrial cancer Neg Hx   . Prostate cancer Neg Hx   .  Pancreatic cancer Neg Hx      SOCIAL HISTORY:  Social Connections: Not on file    REVIEW OF SYSTEMS:  Denies appetite changes, fevers, chills, fatigue, unexplained weight changes. Denies hearing loss, neck lumps or masses, mouth sores, ringing in ears or voice changes. Denies cough or wheezing.  Denies shortness of breath. Denies chest pain or palpitations. Denies leg swelling. Denies abdominal distention, pain, blood in stools, constipation, diarrhea, nausea, vomiting, or early satiety. Denies pain with intercourse, dysuria, frequency, hematuria or incontinence. Denies hot flashes, pelvic pain, vaginal bleeding or vaginal discharge.   Denies joint pain, back pain or muscle pain/cramps. Denies itching, rash, or wounds. Denies dizziness, headaches, numbness or seizures. Denies swollen lymph nodes or glands, denies easy bruising or bleeding. Denies anxiety, depression, confusion, or decreased concentration.  Physical Exam:  Vital Signs for this encounter:  Blood pressure (!) 161/67, pulse 64, temperature 97.8 F (36.6 C), temperature source Tympanic, resp. rate 18, weight 104 lb (47.2 kg), SpO2 100 %. Body  mass index is 21.74 kg/m. General: Alert, oriented, no acute distress.  HEENT: Normocephalic, atraumatic. Sclera anicteric.  Chest: Clear to auscultation bilaterally. No wheezes, rhonchi, or rales. Cardiovascular: Regular rate and rhythm, no murmurs, rubs, or gallops.  Abdomen: Normoactive bowel sounds. Soft, nondistended, nontender to palpation. No masses or hepatosplenomegaly appreciated. No palpable fluid wave.  Extremities: Grossly normal range of motion. Warm, well perfused. No edema bilaterally.  Skin: No rashes or lesions.  Lymphatics: No cervical, supraclavicular, or inguinal adenopathy.  GU:  Normal external female genitalia. No lesions. No discharge or bleeding.             Bladder/urethra:  No lesions or masses, well supported bladder             Vagina: Moderately atrophic vaginal mucosa, no lesions or masses noted.             Cervix: Normal appearing, no lesions.  Atrophic.             Uterus: Retroverted, small, mobile, no parametrial involvement or nodularity.             Adnexa: Fullness appreciated in the cul-de-sac that is mobile and smooth.   Rectal: Confirms findings above, no nodularity.  LABORATORY AND RADIOLOGIC DATA:  Outside medical records were reviewed to synthesize the above history, along with the history and physical obtained during the visit.   Lab Results  Component Value Date   WBC 5.5 12/25/2020   HGB 13.9 12/25/2020   HCT 40.0 12/25/2020   PLT 226 12/25/2020   GLUCOSE 97 12/11/2020   ALT 9 12/11/2020   AST 18 12/11/2020   NA 143 12/11/2020   K 4.3 12/11/2020   CL 104 12/11/2020   CREATININE 0.80 12/11/2020   BUN 14 12/11/2020   CO2 29 12/11/2020   INR 1.0 09/16/2019   CT A/P on 08/2019: Simple 5.3 cm left adnexal cyst (series 3/image 94).  CT A/P on 12/02/20: Increased size of the left adnexal cyst which now measures 6 cm in maximum axial dimension on image 86/2, previously 5.3 cm. The right adnexa is unremarkable.  Pelvic ultrasound  01/01/21: Right ovary Measurements: 6.1 x 4.4 x 6.1 cm = volume: 86.5 mL. There is a simple appearing adnexal cyst measuring approximately 5.7 x 3.9 x 5.2 cm.

## 2021-01-09 NOTE — Patient Instructions (Signed)
It was a pleasure meeting you today.  As we discussed, the features of your cyst on ultrasound are very reassuring.  My concern or suspicion that this is a precancerous or cancerous cyst is very low.  Because CT scan is not very good at looking at masses on the ovary, I recommend that we repeat a pelvic ultrasound in 3-6 months.  This is already scheduled.  I will call you when we have the results from this repeat ultrasound.  As long as there is no significant change in size to the cyst and there continued to be no features that are concerning for a possible cancer, then we can continue to watch it.  The reasons we would discuss potential surgery in the future would be if the cyst grew, if there were features concerning for cancer, or if you began having symptoms that we thought were related to the cyst itself.

## 2021-01-12 ENCOUNTER — Other Ambulatory Visit: Payer: Self-pay

## 2021-01-12 DIAGNOSIS — C9110 Chronic lymphocytic leukemia of B-cell type not having achieved remission: Secondary | ICD-10-CM

## 2021-01-12 MED ORDER — ELTROMBOPAG OLAMINE 25 MG PO TABS: 25.0000 mg | ORAL_TABLET | Freq: Every day | ORAL | 3 refills | Status: DC

## 2021-01-21 ENCOUNTER — Other Ambulatory Visit (HOSPITAL_COMMUNITY): Payer: Self-pay

## 2021-01-22 NOTE — Progress Notes (Signed)
HEMATOLOGY/ONCOLOGY CLINIC NOTE  Date of Service: 01/23/2021  Patient Care Team: Selena Caraway, MD as PCP - General (Family Medicine) Selena Genera, MD as Consulting Physician (Hematology)  CHIEF COMPLAINTS/PURPOSE OF CONSULTATION:  F/u for CLL and ITP  HISTORY OF PRESENTING ILLNESS:   Selena Branch who has been transferred to [place] from Dr. Mathis Dad [place] for evaluation and management of CLL. The pt reports that she is doing well overall.  The pt reports that has been slightly fatigued in the past 1-2 months, but thinks it is from the heat. The fatigue does not affect her ability to function. Denies fevers, chills, and night sweats. She reports that she saw her PCP in August, who noticed that her blood counts were high and referred her to Dr. Irene Limbo.  Lab results today (05/31/2019) of CBC w diff and CMP is as follows: all values are WNL except for WBC at 39.1k, neutro abs at 1.6k, lymphs abs at 36.8k, monocytes abs at 0.0k, eosinophils abs at 0.8k, BUN at 27, Total protein at 6.4 05/31/2019 LDH is pending  On review of systems, pt reports mild fatigue and denies belly pain, infection issues and any other symptoms.   INTERVAL HISTORY:  Selena Branch is a 80 y.o. Branch here for evaluation and management of CLL. The patient's last visit with [place] was on 12/25/2020. The pt reports that she is doing well overall. We are joined today by her husband.  The pt reports that she has been experiencing issues with getting her Promacta from the place she currently goes. The pt notes that she had to miss a day last month and three days this month due to them being so slow. The pt notes that the three missed days were Sat, Sun, Mon of this week. The pt notes she tried to get it for eight days prior to being late. The pt notes she saw Dr. Berline Lopes and is unconcerned regarding the cyst unless the pt becomes symptomatic. She will be getting a repeat US later in the  year.  The pt has had US Pelvic Complete w Transvaginal on 01/01/2021, which revealed "1. There is a simple appearing 5.7 cm right adnexal cyst. Consider GYN consult   Lab results today 01/23/2021 of CBC w/diff and CMP is as follows: all values are WNL except for Plt of 49K, Total Protein of 6.3. 01/23/2021 LDH of 162.  On review of systems, pt denies abdominal pain, leg swelling, fevers, bruising issues, bleeding issues, and any other symptoms.  MEDICAL HISTORY:  Past Medical History:  Diagnosis Date  . Anxiety   . CLL (chronic lymphocytic leukemia) ([place]) 11/2018  . Glaucoma   . Hyperlipemia   . Hypertension   . Osteoporosis     SURGICAL HISTORY: No past surgical history on file.  SOCIAL HISTORY: Social History   Socioeconomic History  . Marital status: Married    Spouse name: Not on file  . Number of children: 1  . Years of education: HS  . Highest education level: Not on file  Occupational History  . Occupation: Retired  Tobacco Use  . Smoking status: Never Smoker  . Smokeless tobacco: Never Used  Vaping Use  . Vaping Use: Never used  Substance and Sexual Activity  . Alcohol use: No    Alcohol/week: 0.0 standard drinks  . Drug use: No  . Sexual activity: Yes    Birth control/protection: Post-menopausal  Other Topics Concern  . Not  on file  Social History Narrative   Drinks 2 cups of coffee   Social Determinants of Health   Financial Resource Strain: Not on file  Food Insecurity: Not on file  Transportation Needs: Not on file  Physical Activity: Not on file  Stress: Not on file  Social Connections: Not on file  Intimate Partner Violence: Not on file    FAMILY HISTORY: Family History  Problem Relation Age of Onset  . Stroke Mother   . Lung cancer Father   . Leukemia Neg Hx   . Breast cancer Neg Hx   . Ovarian cancer Neg Hx   . Colon cancer Neg Hx   . Endometrial cancer Neg Hx   . Prostate cancer Neg Hx   . Pancreatic cancer Neg Hx      ALLERGIES:  is allergic to codeine, evista [raloxifene], alendronate, ibandronic acid, and risedronate.  MEDICATIONS:  Current Outpatient Medications  Medication Sig Dispense Refill  . amLODipine (NORVASC) 10 MG tablet Take 1 tablet (10 mg total) by mouth at bedtime. 30 tablet 0  . atorvastatin (LIPITOR) 40 MG tablet Take 40 mg by mouth daily.     . Brinzolamide-Brimonidine (SIMBRINZA) 1-0.2 % SUSP Place 1 drop into both eyes daily at 12 noon. (Patient not taking: Reported on 01/06/2021)    . Calcium Carbonate-Vitamin D (CALCIUM-D PO) Take 600 mg by mouth daily.    . Cholecalciferol (VITAMIN D3) 2000 units TABS Take 2,000 Units by mouth daily.     . dorzolamide-timolol (COSOPT) 22.3-6.8 MG/ML ophthalmic solution Place 1 drop into both eyes daily.     Marland Kitchen eltrombopag (PROMACTA) 25 MG tablet Take 1 tablet (25 mg total) by mouth daily. Take on an empty stomach, 1 hour before a meal or 2 hours after. 30 tablet 3  . furosemide (LASIX) 20 MG tablet Take 40 mg for 3 days then resume your 20 mg daily (Patient taking differently: Take 20 mg by mouth daily.) 30 tablet   . latanoprost (XALATAN) 0.005 % ophthalmic solution Place 1 drop into both eyes at bedtime.     . Multiple Vitamin (MULTIVITAMIN WITH MINERALS) TABS tablet Take 1 tablet by mouth daily.    . quinapril (ACCUPRIL) 40 MG tablet Take 40 mg by mouth daily.     . sertraline (ZOLOFT) 25 MG tablet Take 25 mg by mouth daily.     No current facility-administered medications for this visit.    REVIEW OF SYSTEMS:   10 Point review of Systems was done is negative except as noted above.   PHYSICAL EXAMINATION:  ECOG FS:2 - Symptomatic, <50% confined to bed  Vitals:   01/23/21 0903  BP: (!) 141/57  Pulse: (!) 56  Resp: 18  Temp: (!) 97 F (36.1 C)  SpO2: 98%   Wt Readings from Last 3 Encounters:  01/23/21 105 lb 11.2 oz (47.9 kg)  01/09/21 104 lb (47.2 kg)  12/25/20 106 lb 9.6 oz (48.4 kg)   Body mass index is 22.09 kg/m.      GENERAL:alert, in no acute distress and comfortable SKIN: no acute rashes, no significant lesions EYES: conjunctiva are pink and non-injected, sclera anicteric OROPHARYNX: MMM, no exudates, no oropharyngeal erythema or ulceration NECK: supple, no JVD LYMPH:  no palpable lymphadenopathy in the cervical, axillary or inguinal regions LUNGS: clear to auscultation b/l with normal respiratory effort HEART: regular rate & rhythm ABDOMEN:  normoactive bowel sounds , non tender, not distended. Extremity: no pedal edema PSYCH: alert & oriented x 3 with  fluent speech NEURO: no focal motor/sensory deficits   LABORATORY DATA:  I have reviewed the data as listed  . CBC Latest Ref Rng & Units 01/23/2021 12/25/2020 12/12/2020  WBC 4.0 - 10.5 K/uL 4.9 5.5 6.4  Hemoglobin 12.0 - 15.0 g/dL 14.1 13.9 14.1  Hematocrit 36.0 - 46.0 % 41.6 40.0 41.2  Platelets 150 - 400 K/uL 49(L) 226 29(L)    . CMP Latest Ref Rng & Units 01/23/2021 12/11/2020 11/14/2020  Glucose 70 - 99 mg/dL 91 97 102(H)  BUN 8 - 23 mg/dL 17 14 19   Creatinine 0.44 - 1.00 mg/dL 0.84 0.80 0.88  Sodium 135 - 145 mmol/L 143 143 146(H)  Potassium 3.5 - 5.1 mmol/L 4.1 4.3 4.9  Chloride 98 - 111 mmol/L 108 104 108  CO2 22 - 32 mmol/L 27 29 29   Calcium 8.9 - 10.3 mg/dL 9.6 9.2 10.1  Total Protein 6.5 - 8.1 g/dL 6.3(L) 6.7 7.2  Total Bilirubin 0.3 - 1.2 mg/dL 0.6 0.4 0.5  Alkaline Phos 38 - 126 U/L 77 85 101  AST 15 - 41 U/L 19 18 23   ALT 0 - 44 U/L 6 9 13    02/27/2019 FISH:    RADIOGRAPHIC STUDIES: I have personally reviewed the radiological images as listed and agreed with the findings in the report.  US PELVIC COMPLETE WITH TRANSVAGINAL  Result Date: 01/02/2021 CLINICAL DATA:  Left adnexal mass noted on CT. EXAM: TRANSABDOMINAL AND TRANSVAGINAL ULTRASOUND OF PELVIS TECHNIQUE: Both transabdominal and transvaginal ultrasound examinations of the pelvis were performed. Transabdominal technique was performed for global imaging of the  pelvis including uterus, ovaries, adnexal regions, and pelvic cul-de-sac. It was necessary to proceed with endovaginal exam following the transabdominal exam to visualize the ovaries. COMPARISON:  CT dated December 01, 2020 FINDINGS: Uterus Measurements: 5.2 x 1.7 x 3.6 cm = volume: 17 mL. No fibroids or other mass visualized. Endometrium Thickness: 1 mm.  No focal abnormality visualized. Right ovary Measurements: 6.1 x 4.4 x 6.1 cm = volume: 86.5 mL. There is a simple appearing adnexal cyst measuring approximately 5.7 x 3.9 x 5.2 cm. Left ovary Not visualized Other findings No abnormal free fluid. IMPRESSION: 1. There is a simple appearing 5.7 cm right adnexal cyst. Consider GYN consult and followup US in 3-6 months, or pelvis MRI w/o and w/ contrast for improved characterization. Note: This recommendation does not apply to premenarchal patients or to those with increased risk (genetic, family history, elevated tumor markers or other high-risk factors) of ovarian cancer. Reference: Radiology 2019 Nov; 293(2):359-371. 2. Nonvisualization of the left ovary. Electronically Signed   By: Constance Holster M.D.   On: 01/02/2021 14:07    ASSESSMENT & PLAN:   #1 CLL Previous CLL prognostic FISH panel without detectable mutations. Has been Rai stage 0.. Now with new anemia and severe thrombocytopenia. WBC counts have increased significantly to 84k from 39k 3 months ago.  Concerning for change in tempo of her disease. -08/31/2019 CT C/A/P ([id number]) ([id number]) revealed "1. Mild multistation lymphadenopathy in the retroperitoneum and bilateral pelvis as detailed, compatible with lymphoma. Normal size spleen. 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  However cannot rule out bone marrow involvement as an etiology of thrombocytopenia as well.  #3 new mild normocytic anemia.   Hemoglobin 11.7 with an MCV of 97.4. --- now resolved hgb today 14.8 Likely related to her CLL.  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  #4 h/o intraparenchymal bleed- rpt CT head stable (due to uncontrolled HTN and thrombocytopenia)  PLAN: -Discussed pt labwork today, 01/23/2021; chemistries normal, Plt lower. LDH improved and normal.  -Advised pt her plt are most likely just lower today due to missing three days of medication.  -Advised pt to take Promacta BID (total 50 mg daily) for the next three days and then switch back to daily. -Will get repeat labs in two weeks to ensure 25 mg Promacta dosage is correct. -Continue to avoid all OTC NSAIDs except Tylenol. -No lab or clinical evidence of CLL progression at this time.  -The pt has no prohibitive toxicities from continuing Promacta at this time. Will continue to monitor with labs and clinical examination. -Will see back in 2 weeks via phone.    FOLLOW UP: Labs in 2 weeks Phone visit after labs (Okay to overbook)   The total time spent in the appointment was 20 minutes and more than 50% was on counseling and direct patient cares.   All of the patient's questions were answered with apparent satisfaction. The patient knows to call the clinic with any problems, questions or concerns.   Sullivan Lone MD [place] AAHIVMS [facility] Hematology/Oncology Physician [facility]  (Office):       [phone] (Work cell):  [phone] (Fax):           [phone]  01/23/2021 9:52 AM  I, Reinaldo Raddle, am acting as scribe for Dr. Sullivan Lone, MD.  .I have reviewed the above documentation for accuracy and completeness, and I agree with the above. Selena Genera MD

## 2021-01-23 ENCOUNTER — Other Ambulatory Visit: Payer: Self-pay

## 2021-01-23 ENCOUNTER — Inpatient Hospital Stay: Payer: PPO

## 2021-01-23 ENCOUNTER — Inpatient Hospital Stay: Payer: PPO | Attending: Hematology | Admitting: Hematology

## 2021-01-23 VITALS — BP 141/57 | HR 56 | Temp 97.0°F | Resp 18 | Ht <= 58 in | Wt 105.7 lb

## 2021-01-23 DIAGNOSIS — D649 Anemia, unspecified: Secondary | ICD-10-CM | POA: Diagnosis not present

## 2021-01-23 DIAGNOSIS — C9110 Chronic lymphocytic leukemia of B-cell type not having achieved remission: Secondary | ICD-10-CM | POA: Diagnosis not present

## 2021-01-23 DIAGNOSIS — D693 Immune thrombocytopenic purpura: Secondary | ICD-10-CM | POA: Insufficient documentation

## 2021-01-23 DIAGNOSIS — N83202 Unspecified ovarian cyst, left side: Secondary | ICD-10-CM | POA: Diagnosis not present

## 2021-01-23 DIAGNOSIS — Z79899 Other long term (current) drug therapy: Secondary | ICD-10-CM | POA: Insufficient documentation

## 2021-01-23 LAB — CBC WITH DIFFERENTIAL/PLATELET
Abs Immature Granulocytes: 0 10*3/uL (ref 0.00–0.07)
Basophils Absolute: 0 10*3/uL (ref 0.0–0.1)
Basophils Relative: 0 %
Eosinophils Absolute: 0.2 10*3/uL (ref 0.0–0.5)
Eosinophils Relative: 3 %
HCT: 41.6 % (ref 36.0–46.0)
Hemoglobin: 14.1 g/dL (ref 12.0–15.0)
Immature Granulocytes: 0 %
Lymphocytes Relative: 48 %
Lymphs Abs: 2.3 10*3/uL (ref 0.7–4.0)
MCH: 31.9 pg (ref 26.0–34.0)
MCHC: 33.9 g/dL (ref 30.0–36.0)
MCV: 94.1 fL (ref 80.0–100.0)
Monocytes Absolute: 0.6 10*3/uL (ref 0.1–1.0)
Monocytes Relative: 11 %
Neutro Abs: 1.9 10*3/uL (ref 1.7–7.7)
Neutrophils Relative %: 38 %
Platelets: 49 10*3/uL — ABNORMAL LOW (ref 150–400)
RBC: 4.42 MIL/uL (ref 3.87–5.11)
RDW: 12.5 % (ref 11.5–15.5)
WBC: 4.9 10*3/uL (ref 4.0–10.5)
nRBC: 0 % (ref 0.0–0.2)

## 2021-01-23 LAB — CMP (CANCER CENTER ONLY)
ALT: 6 U/L (ref 0–44)
AST: 19 U/L (ref 15–41)
Albumin: 4.1 g/dL (ref 3.5–5.0)
Alkaline Phosphatase: 77 U/L (ref 38–126)
Anion gap: 8 (ref 5–15)
BUN: 17 mg/dL (ref 8–23)
CO2: 27 mmol/L (ref 22–32)
Calcium: 9.6 mg/dL (ref 8.9–10.3)
Chloride: 108 mmol/L (ref 98–111)
Creatinine: 0.84 mg/dL (ref 0.44–1.00)
GFR, Estimated: >60 mL/min (ref >60)
Glucose, Bld: 91 mg/dL (ref 70–99)
Potassium: 4.1 mmol/L (ref 3.5–5.1)
Sodium: 143 mmol/L (ref 135–145)
Total Bilirubin: 0.6 mg/dL (ref 0.3–1.2)
Total Protein: 6.3 g/dL — ABNORMAL LOW (ref 6.5–8.1)

## 2021-01-23 LAB — LACTATE DEHYDROGENASE: LDH: 162 U/L (ref 98–192)

## 2021-01-26 ENCOUNTER — Telehealth: Payer: Self-pay | Admitting: Hematology

## 2021-01-26 NOTE — Telephone Encounter (Signed)
Scheduled follow-up appointments per 5/13 los. Patient is aware. 

## 2021-02-02 DIAGNOSIS — H903 Sensorineural hearing loss, bilateral: Secondary | ICD-10-CM | POA: Diagnosis not present

## 2021-02-06 ENCOUNTER — Other Ambulatory Visit: Payer: Self-pay

## 2021-02-06 ENCOUNTER — Inpatient Hospital Stay: Payer: PPO

## 2021-02-06 DIAGNOSIS — C9110 Chronic lymphocytic leukemia of B-cell type not having achieved remission: Secondary | ICD-10-CM | POA: Diagnosis not present

## 2021-02-06 DIAGNOSIS — D693 Immune thrombocytopenic purpura: Secondary | ICD-10-CM

## 2021-02-06 LAB — CBC WITH DIFFERENTIAL/PLATELET
Abs Immature Granulocytes: 0.01 10*3/uL (ref 0.00–0.07)
Basophils Absolute: 0 10*3/uL (ref 0.0–0.1)
Basophils Relative: 0 %
Eosinophils Absolute: 0.1 10*3/uL (ref 0.0–0.5)
Eosinophils Relative: 2 %
HCT: 43.2 % (ref 36.0–46.0)
Hemoglobin: 14.7 g/dL (ref 12.0–15.0)
Immature Granulocytes: 0 %
Lymphocytes Relative: 42 %
Lymphs Abs: 2.8 10*3/uL (ref 0.7–4.0)
MCH: 32 pg (ref 26.0–34.0)
MCHC: 34 g/dL (ref 30.0–36.0)
MCV: 94.1 fL (ref 80.0–100.0)
Monocytes Absolute: 0.7 10*3/uL (ref 0.1–1.0)
Monocytes Relative: 11 %
Neutro Abs: 2.9 10*3/uL (ref 1.7–7.7)
Neutrophils Relative %: 45 %
Platelets: 61 10*3/uL — ABNORMAL LOW (ref 150–400)
RBC: 4.59 MIL/uL (ref 3.87–5.11)
RDW: 12.5 % (ref 11.5–15.5)
WBC: 6.6 10*3/uL (ref 4.0–10.5)
nRBC: 0 % (ref 0.0–0.2)

## 2021-02-06 LAB — IMMATURE PLATELET FRACTION: Immature Platelet Fraction: 2.1 % (ref 1.2–8.6)

## 2021-02-09 NOTE — Progress Notes (Signed)
HEMATOLOGY/ONCOLOGY CLINIC NOTE  Date of Service: 02/10/2021  Patient Care Team: [name], MD as PCP - General (Family Medicine) [name], MD as Consulting Physician (Hematology)  CHIEF COMPLAINTS/PURPOSE OF CONSULTATION:  F/u for CLL and ITP  HISTORY OF PRESENTING ILLNESS:   Selena Branch is a wonderful 80 y.o. female who has been transferred to [place] from Dr. Mathis Dad [place] for evaluation and management of CLL. The pt reports that she is doing well overall.  The pt reports that has been slightly fatigued in the past 1-2 months, but thinks it is from the heat. The fatigue does not affect her ability to function. Denies fevers, chills, and night sweats. She reports that she saw her PCP in August, who noticed that her blood counts were high and referred her to Dr. Irene Limbo.  Lab results today (05/31/2019) of CBC w diff and CMP is as follows: all values are WNL except for WBC at 39.1k, neutro abs at 1.6k, lymphs abs at 36.8k, monocytes abs at 0.0k, eosinophils abs at 0.8k, BUN at 27, Total protein at 6.4 05/31/2019 LDH is pending  On review of systems, pt reports mild fatigue and denies belly pain, infection issues and any other symptoms.   INTERVAL HISTORY: I connected with DUANNA RUNK on 02/10/2021 by telephone and verified that I am speaking with the correct person using two identifiers.   I discussed the limitations of evaluation and management by telemedicine. The patient expressed understanding and agreed to proceed.   Other persons participating in the visit and their role in the encounter:                                                         - Reinaldo Raddle, Medical Scribe     Patient's location: Home Provider's location: [facility] [place] is a 80 y.o. 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 The patient's last visit with [place] was on 01/23/2021. The pt reports that she is doing well overall.  The pt reports that she has been improving  since the last visit. She has been able to take the Promacta regularly with no issues. She has already put an order in for a refill on this to ensure no issues with missed dosage like last time.  Lab results 02/06/2021 of CBC w/diff and CMP is as follows: all values are WNL except for Plt of 61K. 02/06/2021 Immature Plt Fract of 2.1.  On review of systems, pt denies bleeding issues and any other symptoms.  MEDICAL HISTORY:  Past Medical History:  Diagnosis Date  . Anxiety   . 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 Glaucoma   . Hyperlipemia   . Hypertension   . Osteoporosis     SURGICAL HISTORY: No past surgical history on file.  SOCIAL HISTORY: Social History   Socioeconomic History  . Marital status: Married    Spouse name: Not on file  . Number of children: 1  . Years of education: HS  . Highest education level: Not on file  Occupational History  . Occupation: Retired  Tobacco Use  . Smoking status: Never Smoker  . Smokeless tobacco: Never Used  Vaping Use  . Vaping Use: Never used  Substance and Sexual Activity  . Alcohol use: No  Alcohol/week: 0.0 standard drinks  . Drug use: No  . Sexual activity: Yes    Birth control/protection: Post-menopausal  Other Topics Concern  . Not on file  Social History Narrative   Drinks 2 cups of coffee   Social Determinants of Health   Financial Resource Strain: Not on file  Food Insecurity: Not on file  Transportation Needs: Not on file  Physical Activity: Not on file  Stress: Not on file  Social Connections: Not on file  Intimate Partner Violence: Not on file    FAMILY HISTORY: Family History  Problem Relation Age of Onset  . Stroke Mother   . Lung cancer Father   . Leukemia Neg Hx   . Breast cancer Neg Hx   . Ovarian cancer Neg Hx   . Colon cancer Neg Hx   . Endometrial cancer Neg Hx   . Prostate cancer Neg Hx   . Pancreatic cancer Neg Hx     ALLERGIES:  is allergic to codeine, evista  [raloxifene], alendronate, ibandronic acid, and risedronate.  MEDICATIONS:  Current Outpatient Medications  Medication Sig Dispense Refill  . amLODipine (NORVASC) 10 MG tablet Take 1 tablet (10 mg total) by mouth at bedtime. 30 tablet 0  . atorvastatin (LIPITOR) 40 MG tablet Take 40 mg by mouth daily.     . Brinzolamide-Brimonidine (SIMBRINZA) 1-0.2 % SUSP Place 1 drop into both eyes daily at 12 noon. (Patient not taking: Reported on 01/06/2021)    . Calcium Carbonate-Vitamin D (CALCIUM-D PO) Take 600 mg by mouth daily.    . Cholecalciferol (VITAMIN D3) 2000 units TABS Take 2,000 Units by mouth daily.     . dorzolamide-timolol (COSOPT) 22.3-6.8 MG/ML ophthalmic solution Place 1 drop into both eyes daily.     Marland Kitchen eltrombopag (PROMACTA) 25 MG tablet Take 1 tablet (25 mg total) by mouth daily. Take on an empty stomach, 1 hour before a meal or 2 hours after. 30 tablet 3  . furosemide (LASIX) 20 MG tablet Take 40 mg for 3 days then resume your 20 mg daily (Patient taking differently: Take 20 mg by mouth daily.) 30 tablet   . latanoprost (XALATAN) 0.005 % ophthalmic solution Place 1 drop into both eyes at bedtime.     . Multiple Vitamin (MULTIVITAMIN WITH MINERALS) TABS tablet Take 1 tablet by mouth daily.    . quinapril (ACCUPRIL) 40 MG tablet Take 40 mg by mouth daily.     . sertraline (ZOLOFT) 25 MG tablet Take 25 mg by mouth daily.     No current facility-administered medications for this visit.    REVIEW OF SYSTEMS:   10 Point review of Systems was done is negative except as noted above.   PHYSICAL EXAMINATION:  ECOG FS:2 - Symptomatic, <50% confined to bed  There were no vitals filed for this visit. Wt Readings from Last 3 Encounters:  01/23/21 105 lb 11.2 oz (47.9 kg)  01/09/21 104 lb (47.2 kg)  12/25/20 106 lb 9.6 oz (48.4 kg)   There is no height or weight on file to calculate BMI.    Telehealth Visit.   LABORATORY DATA:  I have reviewed the data as listed  . CBC Latest Ref  Rng & Units 02/06/2021 01/23/2021 12/25/2020  WBC 4.0 - 10.5 K/uL 6.6 4.9 5.5  Hemoglobin 12.0 - 15.0 g/dL 14.7 14.1 13.9  Hematocrit 36.0 - 46.0 % 43.2 41.6 40.0  Platelets 150 - 400 K/uL 61(L) 49(L) 226    . CMP Latest Ref Rng & Units  01/23/2021 12/11/2020 11/14/2020  Glucose 70 - 99 mg/dL 91 97 102(H)  BUN 8 - 23 mg/dL 17 14 19   Creatinine 0.44 - 1.00 mg/dL 0.84 0.80 0.88  Sodium 135 - 145 mmol/L 143 143 146(H)  Potassium 3.5 - 5.1 mmol/L 4.1 4.3 4.9  Chloride 98 - 111 mmol/L 108 104 108  CO2 22 - 32 mmol/L 27 29 29   Calcium 8.9 - 10.3 mg/dL 9.6 9.2 10.1  Total Protein 6.5 - 8.1 g/dL 6.3(L) 6.7 7.2  Total Bilirubin 0.3 - 1.2 mg/dL 0.6 0.4 0.5  Alkaline Phos 38 - 126 U/L 77 85 101  AST 15 - 41 U/L 19 18 23   ALT 0 - 44 U/L 6 9 13    02/27/2019 FISH:    RADIOGRAPHIC STUDIES: I have personally reviewed the radiological images as listed and agreed with the findings in the report.  No results found.  ASSESSMENT & PLAN:   #1 CLL Previous CLL prognostic FISH panel without detectable mutations. Has been Rai stage 0.. Now with new anemia and severe thrombocytopenia. WBC counts have increased significantly to 84k from 39k 3 months ago.  Concerning for change in tempo of her disease. -08/31/2019 CT C/A/P ([id number]) ([id number]) revealed "1. Mild multistation lymphadenopathy in the retroperitoneum and bilateral pelvis as detailed, compatible with lymphoma. Normal size spleen. 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  However cannot rule out bone marrow involvement as an etiology of thrombocytopenia as well.  #3 new mild normocytic anemia.  Hemoglobin 11.7 with an MCV of 97.4. --- now resolved hgb today 14.8 Likely related to her CLL. 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  #4 h/o intraparenchymal bleed- rpt  CT head stable (due to uncontrolled HTN and thrombocytopenia)  PLAN: -Discussed pt labwork, 05/272022; Plt increased to 61K. Immature Fract normal. -Will keep Promacta dosage at 25 mg due to Plt > 50K.  -Continue to avoid all OTC NSAIDs except Tylenol. -No lab or clinical evidence of CLL progression at this time.  -The pt has no prohibitive toxicities from continuing 25 mg Promacta at this time. Will continue to monitor with labs and clinical examination. -Will see back in 6 weeks with labs.   FOLLOW UP: RTC with Dr Irene Limbo with labs in 6 weeks   The total time spent in the appointment was 10 minutes and more than 50% was on counseling and direct patient cares.   All of the patient's questions were answered with apparent satisfaction. The patient knows to call the clinic with any problems, questions or concerns.   Sullivan Lone MD [place] AAHIVMS [facility] Hematology/Oncology Physician [facility]  (Office):       [phone] (Work cell):  [phone] (Fax):           [phone]  02/10/2021 11:41 AM  I, Reinaldo Raddle, am acting as scribe for Dr. Sullivan Lone, MD.      .I have reviewed the above documentation for accuracy and completeness, and I agree with the above. [name] MD

## 2021-02-10 ENCOUNTER — Inpatient Hospital Stay (HOSPITAL_BASED_OUTPATIENT_CLINIC_OR_DEPARTMENT_OTHER): Payer: PPO | Admitting: Hematology

## 2021-02-10 DIAGNOSIS — C9110 Chronic lymphocytic leukemia of B-cell type not having achieved remission: Secondary | ICD-10-CM | POA: Diagnosis not present

## 2021-02-10 DIAGNOSIS — D693 Immune thrombocytopenic purpura: Secondary | ICD-10-CM

## 2021-02-11 ENCOUNTER — Telehealth: Payer: Self-pay | Admitting: Hematology

## 2021-02-11 NOTE — Telephone Encounter (Signed)
Scheduled follow-up appointment per 5/31 los. Patient is aware.

## 2021-02-16 ENCOUNTER — Encounter: Payer: Self-pay | Admitting: Hematology

## 2021-03-23 NOTE — Progress Notes (Signed)
HEMATOLOGY/ONCOLOGY CLINIC NOTE  Date of Service: 03/24/2021  Patient Care Team: [name], MD as PCP - General (Family Medicine) [name], MD as Consulting Physician (Hematology)  CHIEF COMPLAINTS/PURPOSE OF CONSULTATION:  F/u for CLL and ITP  HISTORY OF PRESENTING ILLNESS:   Selena Branch is a wonderful 80 y.o. female who has been transferred to [place] from Dr. Mathis Dad [place] for evaluation and management of CLL. The pt reports that she is doing well overall.  The pt reports that has been slightly fatigued in the past 1-2 months, but thinks it is from the heat. The fatigue does not affect her ability to function. Denies fevers, chills, and night sweats. She reports that she saw her PCP in August, who noticed that her blood counts were high and referred her to Dr. Irene Limbo.  Lab results today (05/31/2019) of CBC w diff and CMP is as follows: all values are WNL except for WBC at 39.1k, neutro abs at 1.6k, lymphs abs at 36.8k, monocytes abs at 0.0k, eosinophils abs at 0.8k, BUN at 27, Total protein at 6.4 05/31/2019 LDH is pending  On review of systems, pt reports mild fatigue and denies belly pain, infection issues and any other symptoms.   INTERVAL HISTORY:  Selena Branch is a 80 y.o. 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 The patient's last visit with [place] was on 02/10/2021. The pt reports that she is doing well overall. We are joined today by her husband.  The pt reports no new symptoms or concerns.   Lab results today 03/23/2021 of CBC w/diff is as follows: all values are WNL except for plt of 72K.  On review of systems, pt denies bleeding issues, bruising issues, leg swelling, and any other symptoms.   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  SOCIAL  HISTORY: Social History   Socioeconomic History   Marital status: Married    Spouse name: Not on file   Number of children: 1   Years of education: HS   Highest education level: Not on file  Occupational History   Occupation: Retired  Tobacco Use   Smoking status: Never   Smokeless tobacco: Never  Vaping Use   Vaping Use: Never used  Substance and Sexual Activity   Alcohol use: No    Alcohol/week: 0.0 standard drinks   Drug use: No   Sexual activity: Yes    Birth control/protection: Post-menopausal  Other Topics Concern   Not on file  Social History Narrative   Drinks 2 cups of coffee   Social Determinants of Health   Financial Resource Strain: Not on file  Food Insecurity: Not on file  Transportation Needs: Not on file  Physical Activity: Not on file  Stress: Not on file  Social Connections: Not on file  Intimate Partner Violence: Not on file    FAMILY HISTORY: Family History  Problem Relation Age of Onset   Stroke Mother    Lung cancer Father    Leukemia Neg Hx    Breast cancer Neg Hx    Ovarian cancer Neg Hx    Colon cancer Neg Hx    Endometrial cancer Neg Hx    Prostate cancer Neg Hx    Pancreatic cancer Neg Hx  ALLERGIES:  is allergic to codeine, evista [raloxifene], alendronate, ibandronic acid, and risedronate.  MEDICATIONS:  Current Outpatient Medications  Medication Sig Dispense Refill   amLODipine (NORVASC) 10 MG tablet Take 1 tablet (10 mg total) by mouth at bedtime. 30 tablet 0   atorvastatin (LIPITOR) 40 MG tablet Take 40 mg by mouth daily.      Brinzolamide-Brimonidine (SIMBRINZA) 1-0.2 % SUSP Place 1 drop into both eyes daily at 12 noon. (Patient not taking: Reported on 01/06/2021)     Calcium Carbonate-Vitamin D (CALCIUM-D PO) Take 600 mg by mouth daily.     Cholecalciferol (VITAMIN D3) 2000 units TABS Take 2,000 Units by mouth daily.      dorzolamide-timolol (COSOPT) 22.3-6.8 MG/ML ophthalmic solution Place 1 drop into both eyes daily.       eltrombopag (PROMACTA) 25 MG tablet Take 1 tablet (25 mg total) by mouth daily. Take on an empty stomach, 1 hour before a meal or 2 hours after. 30 tablet 3   furosemide (LASIX) 20 MG tablet Take 40 mg for 3 days then resume your 20 mg daily (Patient taking differently: Take 20 mg by mouth daily.) 30 tablet    latanoprost (XALATAN) 0.005 % ophthalmic solution Place 1 drop into both eyes at bedtime.      Multiple Vitamin (MULTIVITAMIN WITH MINERALS) TABS tablet Take 1 tablet by mouth daily.     quinapril (ACCUPRIL) 40 MG tablet Take 40 mg by mouth daily.      sertraline (ZOLOFT) 25 MG tablet Take 25 mg by mouth daily.     No current facility-administered medications for this visit.    REVIEW OF SYSTEMS:   10 Point review of Systems was done is negative except as noted above.   PHYSICAL EXAMINATION:  ECOG FS:2 - Symptomatic, <50% confined to bed  Vitals:   03/24/21 0855  BP: (!) 145/57  Pulse: 64  Resp: 18  Temp: 98.2 F (36.8 C)  SpO2: 100%   Wt Readings from Last 3 Encounters:  03/24/21 104 lb 12.8 oz (47.5 kg)  01/23/21 105 lb 11.2 oz (47.9 kg)  01/09/21 104 lb (47.2 kg)   Body mass index is 21.9 kg/m.    NAD. GENERAL:alert, in no acute distress and comfortable SKIN: no acute rashes, no significant lesions EYES: conjunctiva are pink and non-injected, sclera anicteric OROPHARYNX: MMM, no exudates, no oropharyngeal erythema or ulceration NECK: supple, no JVD LYMPH:  no palpable lymphadenopathy in the cervical, axillary or inguinal regions LUNGS: clear to auscultation b/l with normal respiratory effort HEART: regular rate & rhythm ABDOMEN:  normoactive bowel sounds , non tender, not distended. Extremity: no pedal edema PSYCH: alert & oriented x 3 with fluent speech NEURO: no focal motor/sensory deficits   LABORATORY DATA:  I have reviewed the data as listed  . CBC Latest Ref Rng & Units 03/24/2021 02/06/2021 01/23/2021  WBC 4.0 - 10.5 K/uL 5.9 6.6 4.9   Hemoglobin 12.0 - 15.0 g/dL 14.0 14.7 14.1  Hematocrit 36.0 - 46.0 % 41.1 43.2 41.6  Platelets 150 - 400 K/uL 72(L) 61(L) 49(L)    . CMP Latest Ref Rng & Units 01/23/2021 12/11/2020 11/14/2020  Glucose 70 - 99 mg/dL 91 97 102(H)  BUN 8 - 23 mg/dL 17 14 19   Creatinine 0.44 - 1.00 mg/dL 0.84 0.80 0.88  Sodium 135 - 145 mmol/L 143 143 146(H)  Potassium 3.5 - 5.1 mmol/L 4.1 4.3 4.9  Chloride 98 - 111 mmol/L 108 104 108  CO2 22 - 32 mmol/L 27  29 29  Calcium 8.9 - 10.3 mg/dL 9.6 9.2 10.1  Total Protein 6.5 - 8.1 g/dL 6.3(L) 6.7 7.2  Total Bilirubin 0.3 - 1.2 mg/dL 0.6 0.4 0.5  Alkaline Phos 38 - 126 U/L 77 85 101  AST 15 - 41 U/L 19 18 23   ALT 0 - 44 U/L 6 9 13    02/27/2019 FISH:    RADIOGRAPHIC STUDIES: I have personally reviewed the radiological images as listed and agreed with the findings in the report.  No results found.  ASSESSMENT & PLAN:   #1 CLL Previous CLL prognostic FISH panel without detectable mutations. Has been Rai stage 0.. Now with new anemia and severe thrombocytopenia. WBC counts have increased significantly to 84k from 39k 3 months ago.  Concerning for change in tempo of her disease. -08/31/2019 CT C/A/P ([id number]) ([id number]) revealed "1. Mild multistation lymphadenopathy in the retroperitoneum and bilateral pelvis as detailed, compatible with lymphoma. Normal size spleen. No thoracic adenopathy."  #2 s/p severe thrombocytopenia with platelet counts around 5k likely related to CLL.  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  However cannot rule out bone marrow involvement as an etiology of thrombocytopenia as well.  #3 new mild normocytic anemia.  Hemoglobin 11.7 with an MCV of 97.4. --- now resolved hgb today 14.8 Likely related to her CLL. 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  #4 h/o intraparenchymal bleed- rpt CT head stable (due to uncontrolled HTN and  thrombocytopenia)  PLAN: -Discussed pt labwork today, 03/24/2021; plt are increasing gradually. -Recommended pt receive the second COVID booster shot as recently approved. Advised pt to wait 4-6 months following first booster shot before getting this. -Continue to avoid all OTC NSAIDs except Tylenol. -No lab or clinical evidence of CLL progression at this time.  -The pt has no prohibitive toxicities from continuing 25 mg Promacta at this time. Will continue to monitor with labs and clinical examination. -Will see back in 2 months with labs.   FOLLOW UP: RTC with Dr Irene Limbo with labs in 2 months   The total time spent in the appointment was 20 minutes and more than 50% was on counseling and direct patient cares.   All of the patient's questions were answered with apparent satisfaction. The patient knows to call the clinic with any problems, questions or concerns.   Sullivan Lone MD [place] AAHIVMS [facility] Hematology/Oncology Physician [facility]  (Office):       [phone] (Work cell):  [phone] (Fax):           [phone]  03/24/2021 10:12 AM  I, Reinaldo Raddle, am acting as scribe for Dr. Sullivan Lone, MD. .I have reviewed the above documentation for accuracy and completeness, and I agree with the above. [name] MD

## 2021-03-24 ENCOUNTER — Telehealth: Payer: Self-pay | Admitting: Hematology

## 2021-03-24 ENCOUNTER — Other Ambulatory Visit: Payer: Self-pay

## 2021-03-24 ENCOUNTER — Inpatient Hospital Stay: Payer: PPO | Attending: Hematology | Admitting: Hematology

## 2021-03-24 ENCOUNTER — Inpatient Hospital Stay: Payer: PPO

## 2021-03-24 VITALS — BP 145/57 | HR 64 | Temp 98.2°F | Resp 18 | Wt 104.8 lb

## 2021-03-24 DIAGNOSIS — C9110 Chronic lymphocytic leukemia of B-cell type not having achieved remission: Secondary | ICD-10-CM

## 2021-03-24 DIAGNOSIS — D693 Immune thrombocytopenic purpura: Secondary | ICD-10-CM | POA: Insufficient documentation

## 2021-03-24 LAB — CBC WITH DIFFERENTIAL/PLATELET
Abs Immature Granulocytes: 0 10*3/uL (ref 0.00–0.07)
Basophils Absolute: 0 10*3/uL (ref 0.0–0.1)
Basophils Relative: 0 %
Eosinophils Absolute: 0.2 10*3/uL (ref 0.0–0.5)
Eosinophils Relative: 3 %
HCT: 41.1 % (ref 36.0–46.0)
Hemoglobin: 14 g/dL (ref 12.0–15.0)
Immature Granulocytes: 0 %
Lymphocytes Relative: 52 %
Lymphs Abs: 3 10*3/uL (ref 0.7–4.0)
MCH: 32.3 pg (ref 26.0–34.0)
MCHC: 34.1 g/dL (ref 30.0–36.0)
MCV: 94.7 fL (ref 80.0–100.0)
Monocytes Absolute: 0.5 10*3/uL (ref 0.1–1.0)
Monocytes Relative: 9 %
Neutro Abs: 2.1 10*3/uL (ref 1.7–7.7)
Neutrophils Relative %: 36 %
Platelets: 72 10*3/uL — ABNORMAL LOW (ref 150–400)
RBC: 4.34 MIL/uL (ref 3.87–5.11)
RDW: 12.2 % (ref 11.5–15.5)
WBC: 5.9 10*3/uL (ref 4.0–10.5)
nRBC: 0 % (ref 0.0–0.2)

## 2021-03-24 NOTE — Telephone Encounter (Signed)
Sch per 7/12 los, pt aware

## 2021-03-30 ENCOUNTER — Encounter: Payer: Self-pay | Admitting: Hematology

## 2021-04-09 ENCOUNTER — Other Ambulatory Visit: Payer: Self-pay

## 2021-04-09 DIAGNOSIS — C9110 Chronic lymphocytic leukemia of B-cell type not having achieved remission: Secondary | ICD-10-CM

## 2021-04-09 MED ORDER — ELTROMBOPAG OLAMINE 25 MG PO TABS: 25.0000 mg | ORAL_TABLET | Freq: Every day | ORAL | 3 refills | Status: DC

## 2021-04-13 ENCOUNTER — Other Ambulatory Visit: Payer: Self-pay

## 2021-04-22 DIAGNOSIS — I10 Essential (primary) hypertension: Secondary | ICD-10-CM | POA: Diagnosis not present

## 2021-04-22 DIAGNOSIS — F4320 Adjustment disorder, unspecified: Secondary | ICD-10-CM | POA: Diagnosis not present

## 2021-04-22 DIAGNOSIS — E782 Mixed hyperlipidemia: Secondary | ICD-10-CM | POA: Diagnosis not present

## 2021-04-22 DIAGNOSIS — Z0000 Encounter for general adult medical examination without abnormal findings: Secondary | ICD-10-CM | POA: Diagnosis not present

## 2021-04-22 DIAGNOSIS — E559 Vitamin D deficiency, unspecified: Secondary | ICD-10-CM | POA: Diagnosis not present

## 2021-04-22 DIAGNOSIS — D693 Immune thrombocytopenic purpura: Secondary | ICD-10-CM | POA: Diagnosis not present

## 2021-04-22 DIAGNOSIS — H409 Unspecified glaucoma: Secondary | ICD-10-CM | POA: Diagnosis not present

## 2021-04-22 DIAGNOSIS — C9110 Chronic lymphocytic leukemia of B-cell type not having achieved remission: Secondary | ICD-10-CM | POA: Diagnosis not present

## 2021-04-22 DIAGNOSIS — M810 Age-related osteoporosis without current pathological fracture: Secondary | ICD-10-CM | POA: Diagnosis not present

## 2021-04-27 DIAGNOSIS — C9110 Chronic lymphocytic leukemia of B-cell type not having achieved remission: Secondary | ICD-10-CM | POA: Diagnosis not present

## 2021-04-27 DIAGNOSIS — N838 Other noninflammatory disorders of ovary, fallopian tube and broad ligament: Secondary | ICD-10-CM | POA: Diagnosis not present

## 2021-04-27 DIAGNOSIS — F4320 Adjustment disorder, unspecified: Secondary | ICD-10-CM | POA: Diagnosis not present

## 2021-04-27 DIAGNOSIS — E782 Mixed hyperlipidemia: Secondary | ICD-10-CM | POA: Diagnosis not present

## 2021-04-27 DIAGNOSIS — Z1389 Encounter for screening for other disorder: Secondary | ICD-10-CM | POA: Diagnosis not present

## 2021-04-27 DIAGNOSIS — I10 Essential (primary) hypertension: Secondary | ICD-10-CM | POA: Diagnosis not present

## 2021-04-27 DIAGNOSIS — Z0000 Encounter for general adult medical examination without abnormal findings: Secondary | ICD-10-CM | POA: Diagnosis not present

## 2021-04-27 DIAGNOSIS — H409 Unspecified glaucoma: Secondary | ICD-10-CM | POA: Diagnosis not present

## 2021-04-27 DIAGNOSIS — M810 Age-related osteoporosis without current pathological fracture: Secondary | ICD-10-CM | POA: Diagnosis not present

## 2021-04-28 ENCOUNTER — Other Ambulatory Visit: Payer: Self-pay | Admitting: Family Medicine

## 2021-04-28 DIAGNOSIS — M810 Age-related osteoporosis without current pathological fracture: Secondary | ICD-10-CM

## 2021-05-12 DIAGNOSIS — Z6822 Body mass index (BMI) 22.0-22.9, adult: Secondary | ICD-10-CM | POA: Diagnosis not present

## 2021-05-12 DIAGNOSIS — U071 COVID-19: Secondary | ICD-10-CM | POA: Diagnosis not present

## 2021-05-13 ENCOUNTER — Other Ambulatory Visit (HOSPITAL_COMMUNITY): Payer: Self-pay

## 2021-05-13 ENCOUNTER — Ambulatory Visit (INDEPENDENT_AMBULATORY_CARE_PROVIDER_SITE_OTHER): Payer: PPO

## 2021-05-13 DIAGNOSIS — U071 COVID-19: Secondary | ICD-10-CM

## 2021-05-13 MED ORDER — ALBUTEROL SULFATE HFA 108 (90 BASE) MCG/ACT IN AERS: 2.0000 | INHALATION_SPRAY | Freq: Once | RESPIRATORY_TRACT | Status: DC | PRN

## 2021-05-13 MED ORDER — METHYLPREDNISOLONE SODIUM SUCC 125 MG IJ SOLR: 125.0000 mg | Freq: Once | INTRAMUSCULAR | Status: DC | PRN

## 2021-05-13 MED ORDER — FAMOTIDINE IN NACL 20-0.9 MG/50ML-% IV SOLN: 20.0000 mg | Freq: Once | INTRAVENOUS | Status: AC | PRN

## 2021-05-13 MED ORDER — SODIUM CHLORIDE 0.9 % IV SOLN: INTRAVENOUS | Status: DC | PRN

## 2021-05-13 MED ORDER — BEBTELOVIMAB 175 MG/2 ML IV (EUA): 175.0000 mg | Freq: Once | INTRAMUSCULAR | Status: DC

## 2021-05-13 MED ORDER — BEBTELOVIMAB 175 MG/2 ML IV (EUA)
175.0000 mg | Freq: Once | INTRAMUSCULAR | Status: AC
  Administered 2021-05-13: 175 mg via INTRAVENOUS

## 2021-05-13 MED ORDER — ALBUTEROL SULFATE HFA 108 (90 BASE) MCG/ACT IN AERS: 2.0000 | INHALATION_SPRAY | Freq: Once | RESPIRATORY_TRACT | Status: AC | PRN

## 2021-05-13 MED ORDER — FAMOTIDINE IN NACL 20-0.9 MG/50ML-% IV SOLN: 20.0000 mg | Freq: Once | INTRAVENOUS | Status: DC | PRN

## 2021-05-13 MED ORDER — DIPHENHYDRAMINE HCL 50 MG/ML IJ SOLN: 50.0000 mg | Freq: Once | INTRAMUSCULAR | Status: AC | PRN

## 2021-05-13 MED ORDER — EPINEPHRINE 0.3 MG/0.3ML IJ SOAJ: 0.3000 mg | Freq: Once | INTRAMUSCULAR | Status: DC | PRN

## 2021-05-13 MED ORDER — EPINEPHRINE 0.3 MG/0.3ML IJ SOAJ: 0.3000 mg | Freq: Once | INTRAMUSCULAR | Status: AC | PRN

## 2021-05-13 MED ORDER — METHYLPREDNISOLONE SODIUM SUCC 125 MG IJ SOLR: 125.0000 mg | Freq: Once | INTRAMUSCULAR | Status: AC | PRN

## 2021-05-13 MED ORDER — DIPHENHYDRAMINE HCL 50 MG/ML IJ SOLN: 50.0000 mg | Freq: Once | INTRAMUSCULAR | Status: DC | PRN

## 2021-05-13 NOTE — Patient Instructions (Addendum)
86 10 Things You Can Do to Manage Your COVID-19 Symptoms at Home If you have possible or confirmed COVID-19 Stay home except to get medical care. Monitor your symptoms carefully. If your symptoms get worse, call your healthcare provider immediately. Get rest and stay hydrated. If you have a medical appointment, call the healthcare provider ahead of time and tell them that you have or may have COVID-19. For medical emergencies, call 911 and notify the dispatch personnel that you have or may have COVID-19. Cover your cough and sneezes with a tissue or use the inside of your elbow. Wash your hands often with soap and water for at least 20 seconds or clean your hands with an alcohol-based hand sanitizer that contains at least 60% alcohol. As much as possible, stay in a specific room and away from other people in your home. Also, you should use a separate bathroom, if available. If you need to be around other people in or outside of the home, wear a mask. Avoid sharing personal items with other people in your household, like dishes, towels, and bedding. Clean all surfaces that are touched often, like counters, tabletops, and doorknobs. Use household cleaning sprays or wipes according to the label instructions. [email or web address] What types of side effects do monoclonal antibody drugs cause?  Common side effects  In general, the more common side effects caused by monoclonal antibody drugs include: Allergic reactions, such as hives or itching Flu-like signs and symptoms, including chills, fatigue, fever, and muscle aches and pains Nausea, vomiting Diarrhea Skin rashes Low blood pressure   The CDC is recommending patients who receive monoclonal antibody treatments wait at least 90 days before being vaccinated.  Currently, there are no data on the safety and efficacy of mRNA COVID-19 vaccines in persons who received monoclonal antibodies or convalescent plasma as part of COVID-19 treatment. Based  on the estimated half-life of such therapies as well as evidence suggesting that reinfection is uncommon in the 90 days after initial infection, vaccination should be deferred for at least 90 days, as a precautionary measure until additional information becomes available, to avoid interference of the antibody treatment with vaccine-induced immune responses.  03/28/2020 Patient was given drug information sheet for Bebtelovimab.  Also given cost estimate sheet for Bebtelovimab.  Patient reviewed documentation and questions were answered.  Patient would like to proceed with treatment at this time.   This information is not intended to replace advice given to you by your health care provider. Make sure you discuss any questions you have with your health care provider. Document Revised: 01/15/2021 Document Reviewed: 01/15/2021 Elsevier Patient Education  [place].

## 2021-05-13 NOTE — Progress Notes (Signed)
Diagnosis: COVID  Provider:  Marshell Garfinkel, MD  Procedure: Infusion  IV Type: Peripheral, IV Location: R Hand  Bebtelovimab, Dose: 175 mg  Infusion Start Time: 1518  Infusion Stop Time: 1519  Post Infusion IV Care: Observation period completed  Discharge: Condition: Good, Destination: Home . AVS provided to patient.   Performed by:  Adda Stokes, Sherlon Handing, LPN

## 2021-05-29 ENCOUNTER — Inpatient Hospital Stay: Payer: PPO | Attending: Hematology

## 2021-05-29 ENCOUNTER — Other Ambulatory Visit: Payer: Self-pay

## 2021-05-29 ENCOUNTER — Inpatient Hospital Stay: Payer: PPO | Admitting: Hematology

## 2021-05-29 VITALS — BP 152/76 | HR 68 | Temp 98.5°F | Resp 17 | Ht <= 58 in | Wt 107.6 lb

## 2021-05-29 DIAGNOSIS — C9110 Chronic lymphocytic leukemia of B-cell type not having achieved remission: Secondary | ICD-10-CM | POA: Insufficient documentation

## 2021-05-29 DIAGNOSIS — D693 Immune thrombocytopenic purpura: Secondary | ICD-10-CM | POA: Insufficient documentation

## 2021-05-29 LAB — CBC WITH DIFFERENTIAL (CANCER CENTER ONLY)
Abs Immature Granulocytes: 0.01 10*3/uL (ref 0.00–0.07)
Basophils Absolute: 0 10*3/uL (ref 0.0–0.1)
Basophils Relative: 1 %
Eosinophils Absolute: 0.1 10*3/uL (ref 0.0–0.5)
Eosinophils Relative: 2 %
HCT: 41.9 % (ref 36.0–46.0)
Hemoglobin: 14.3 g/dL (ref 12.0–15.0)
Immature Granulocytes: 0 %
Lymphocytes Relative: 43 %
Lymphs Abs: 2.6 10*3/uL (ref 0.7–4.0)
MCH: 32.1 pg (ref 26.0–34.0)
MCHC: 34.1 g/dL (ref 30.0–36.0)
MCV: 93.9 fL (ref 80.0–100.0)
Monocytes Absolute: 0.6 10*3/uL (ref 0.1–1.0)
Monocytes Relative: 10 %
Neutro Abs: 2.7 10*3/uL (ref 1.7–7.7)
Neutrophils Relative %: 44 %
Platelet Count: 166 10*3/uL (ref 150–400)
RBC: 4.46 MIL/uL (ref 3.87–5.11)
RDW: 12.5 % (ref 11.5–15.5)
WBC Count: 6 10*3/uL (ref 4.0–10.5)
nRBC: 0 % (ref 0.0–0.2)

## 2021-05-29 LAB — IMMATURE PLATELET FRACTION: Immature Platelet Fraction: 1.5 % (ref 1.2–8.6)

## 2021-05-29 LAB — CMP (CANCER CENTER ONLY)
ALT: 11 U/L (ref 0–44)
AST: 19 U/L (ref 15–41)
Albumin: 4.2 g/dL (ref 3.5–5.0)
Alkaline Phosphatase: 81 U/L (ref 38–126)
Anion gap: 8 (ref 5–15)
BUN: 13 mg/dL (ref 8–23)
CO2: 30 mmol/L (ref 22–32)
Calcium: 9.9 mg/dL (ref 8.9–10.3)
Chloride: 107 mmol/L (ref 98–111)
Creatinine: 0.85 mg/dL (ref 0.44–1.00)
GFR, Estimated: >60 mL/min (ref >60)
Glucose, Bld: 94 mg/dL (ref 70–99)
Potassium: 4.4 mmol/L (ref 3.5–5.1)
Sodium: 145 mmol/L (ref 135–145)
Total Bilirubin: 0.5 mg/dL (ref 0.3–1.2)
Total Protein: 6.7 g/dL (ref 6.5–8.1)

## 2021-06-03 ENCOUNTER — Ambulatory Visit (HOSPITAL_COMMUNITY)
Admission: RE | Admit: 2021-06-03 | Discharge: 2021-06-03 | Disposition: A | Discharge status: Home / Self Care | Payer: PPO | Source: Ambulatory Visit | Attending: Gynecologic Oncology | Admitting: Gynecologic Oncology

## 2021-06-03 ENCOUNTER — Other Ambulatory Visit: Payer: Self-pay

## 2021-06-03 DIAGNOSIS — N83291 Other ovarian cyst, right side: Secondary | ICD-10-CM | POA: Diagnosis not present

## 2021-06-03 DIAGNOSIS — N888 Other specified noninflammatory disorders of cervix uteri: Secondary | ICD-10-CM | POA: Diagnosis not present

## 2021-06-03 DIAGNOSIS — N9489 Other specified conditions associated with female genital organs and menstrual cycle: Secondary | ICD-10-CM | POA: Insufficient documentation

## 2021-06-03 DIAGNOSIS — Z780 Asymptomatic menopausal state: Secondary | ICD-10-CM | POA: Diagnosis not present

## 2021-06-04 ENCOUNTER — Telehealth: Payer: Self-pay | Admitting: Gynecologic Oncology

## 2021-06-04 ENCOUNTER — Encounter: Payer: Self-pay | Admitting: Hematology

## 2021-06-04 DIAGNOSIS — N9489 Other specified conditions associated with female genital organs and menstrual cycle: Secondary | ICD-10-CM

## 2021-06-04 NOTE — Progress Notes (Signed)
HEMATOLOGY/ONCOLOGY CLINIC NOTE  Date of Service: 06/04/2021  Patient Care Team: [name], MD as PCP - General (Family Medicine) [name], MD as Consulting Physician (Hematology)  CHIEF COMPLAINTS/PURPOSE OF CONSULTATION:  F/u for CLL and ITP  HISTORY OF PRESENTING ILLNESS:   Selena Branch is a wonderful 80 y.o. female who has been transferred to [place] from Dr. Mathis Dad [place] for evaluation and management of CLL. The pt reports that she is doing well overall.  The pt reports that has been slightly fatigued in the past 1-2 months, but thinks it is from the heat. The fatigue does not affect her ability to function. Denies fevers, chills, and night sweats. She reports that she saw her PCP in August, who noticed that her blood counts were high and referred her to Dr. Irene Limbo.  Lab results today (05/31/2019) of CBC w diff and CMP is as follows: all values are WNL except for WBC at 39.1k, neutro abs at 1.6k, lymphs abs at 36.8k, monocytes abs at 0.0k, eosinophils abs at 0.8k, BUN at 27, Total protein at 6.4 05/31/2019 LDH is pending  On review of systems, pt reports mild fatigue and denies belly pain, infection issues and any other symptoms.   INTERVAL HISTORY:  Selena Branch is a 80 y.o. 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 The patient's last visit with [place] was on 7/12/022. The pt reports that she is doing well overall. We are joined today by her daughter. The pt reports no new symptoms or concerns.  No new fatigue no bleeding issues no new infection issues.  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  SOCIAL HISTORY: Social History   Socioeconomic History   Marital status: Married    Spouse  name: Not on file   Number of children: 1   Years of education: HS   Highest education level: Not on file  Occupational History   Occupation: Retired  Tobacco Use   Smoking status: Never   Smokeless tobacco: Never  Vaping Use   Vaping Use: Never used  Substance and Sexual Activity   Alcohol use: No    Alcohol/week: 0.0 standard drinks   Drug use: No   Sexual activity: Yes    Birth control/protection: Post-menopausal  Other Topics Concern   Not on file  Social History Narrative   Drinks 2 cups of coffee   Social Determinants of Health   Financial Resource Strain: Not on file  Food Insecurity: Not on file  Transportation Needs: Not on file  Physical Activity: Not on file  Stress: Not on file  Social Connections: Not on file  Intimate Partner Violence: Not on file    FAMILY HISTORY: Family History  Problem Relation Age of Onset   Stroke Mother    Lung cancer Father    Leukemia Neg Hx    Breast cancer Neg Hx    Ovarian cancer Neg Hx    Colon cancer Neg Hx    Endometrial cancer Neg Hx    Prostate cancer Neg Hx    Pancreatic cancer Neg Hx     ALLERGIES:  is allergic to codeine, evista [raloxifene], alendronate, ibandronic acid, and risedronate.  MEDICATIONS:  Current Outpatient Medications  Medication Sig Dispense Refill   amLODipine (NORVASC) 10 MG tablet Take 1 tablet (10 mg total) by mouth at bedtime. 30 tablet 0   atorvastatin (LIPITOR) 40 MG tablet Take 40 mg by mouth daily.      Brinzolamide-Brimonidine (SIMBRINZA) 1-0.2 % SUSP Place 1 drop into both eyes daily at 12 noon. (Patient not taking: Reported on 01/06/2021)     Calcium Carbonate-Vitamin D (CALCIUM-D PO) Take 600 mg by mouth daily.     Cholecalciferol (VITAMIN D3) 2000 units TABS Take 2,000 Units by mouth daily.      dorzolamide-timolol (COSOPT) 22.3-6.8 MG/ML ophthalmic solution Place 1 drop into both eyes daily.      eltrombopag (PROMACTA) 25 MG tablet Take 1 tablet (25 mg total) by mouth daily.  Take on an empty stomach, 1 hour before a meal or 2 hours after. 30 tablet 3   furosemide (LASIX) 20 MG tablet Take 40 mg for 3 days then resume your 20 mg daily (Patient taking differently: Take 20 mg by mouth daily.) 30 tablet    latanoprost (XALATAN) 0.005 % ophthalmic solution Place 1 drop into both eyes at bedtime.      Multiple Vitamin (MULTIVITAMIN WITH MINERALS) TABS tablet Take 1 tablet by mouth daily.     quinapril (ACCUPRIL) 40 MG tablet Take 60 mg by mouth daily.     sertraline (ZOLOFT) 25 MG tablet Take 25 mg by mouth daily.     Current Facility-Administered Medications  Medication Dose Route Frequency Provider Last Rate Last Admin   0.9 %  sodium chloride infusion   Intravenous PRN Kathyrn Lass, MD        REVIEW OF SYSTEMS:   .10 Point review of Systems was done is negative except as noted above.   PHYSICAL EXAMINATION:  ECOG FS:2 - Symptomatic, <50% confined to bed  Vitals:   05/29/21 1102  BP: (!) 152/76  Pulse: 68  Resp: 17  Temp: 98.5 F (36.9 C)  SpO2: 99%   Wt Readings from Last 3 Encounters:  05/29/21 107 lb 9.6 oz (48.8 kg)  03/24/21 104 lb 12.8 oz (47.5 kg)  01/23/21 105 lb 11.2 oz (47.9 kg)   Body mass index is 22.49 kg/m.    Marland Kitchen GENERAL:alert, in no acute distress and comfortable SKIN: no acute rashes, no significant lesions EYES: conjunctiva are pink and non-injected, sclera anicteric OROPHARYNX: MMM, no exudates, no oropharyngeal erythema or ulceration NECK: supple, no JVD LYMPH:  no palpable lymphadenopathy in the cervical, axillary or inguinal regions LUNGS: clear to auscultation b/l with normal respiratory effort HEART: regular rate & rhythm ABDOMEN:  normoactive bowel sounds , non tender, not distended. Extremity: no pedal edema PSYCH: alert & oriented x 3 with fluent speech NEURO: no focal motor/sensory deficits    LABORATORY DATA:  I have reviewed the data as listed  . CBC Latest Ref Rng & Units 05/29/2021 03/24/2021 02/06/2021   WBC 4.0 - 10.5 K/uL 6.0 5.9 6.6  Hemoglobin 12.0 - 15.0 g/dL 14.3 14.0 14.7  Hematocrit 36.0 - 46.0 % 41.9 41.1 43.2  Platelets 150 - 400 K/uL 166 72(L) 61(L)    . CMP Latest Ref Rng & Units 05/29/2021 01/23/2021 12/11/2020  Glucose 70 - 99 mg/dL 94 91 97  BUN 8 - 23 mg/dL 13 17 14   Creatinine 0.44 - 1.00 mg/dL 0.85 0.84 0.80  Sodium 135 - 145 mmol/L 145 143 143  Potassium 3.5 - 5.1 mmol/L 4.4 4.1 4.3  Chloride 98 -  111 mmol/L 107 108 104  CO2 22 - 32 mmol/L 30 27 29   Calcium 8.9 - 10.3 mg/dL 9.9 9.6 9.2  Total Protein 6.5 - 8.1 g/dL 6.7 6.3(L) 6.7  Total Bilirubin 0.3 - 1.2 mg/dL 0.5 0.6 0.4  Alkaline Phos 38 - 126 U/L 81 77 85  AST 15 - 41 U/L 19 19 18   ALT 0 - 44 U/L 11 6 9    02/27/2019 FISH:    RADIOGRAPHIC STUDIES: I have personally reviewed the radiological images as listed and agreed with the findings in the report.  US PELVIC COMPLETE WITH TRANSVAGINAL  Result Date: 06/03/2021 CLINICAL DATA:  Follow-up RIGHT adnexal cyst, postmenopausal EXAM: TRANSABDOMINAL AND TRANSVAGINAL ULTRASOUND OF PELVIS TECHNIQUE: Both transabdominal and transvaginal ultrasound examinations of the pelvis were performed. Transabdominal technique was performed for global imaging of the pelvis including uterus, ovaries, adnexal regions, and pelvic cul-de-sac. It was necessary to proceed with endovaginal exam following the transabdominal exam to visualize the uterus and LEFT ovary. COMPARISON:  01/01/2021 FINDINGS: Uterus Measurements: 5.2 x 2.3 x 3.1 cm = volume: 19 mL. Retroverted. Nabothian cyst at cervix. No focal mass. Endometrium Thickness: 2 mm.  No endometrial fluid or focal abnormality Right ovary Measurements: 6.1 x 4.6 x 5.9 cm = volume: 87 mL. Large simple cyst within RIGHT ovary 5.7 x 4.3 x 5.4 cm, lacking septations and mural nodularity; finding is concerning for a low-grade cystic neoplasm and recommend either gynecology consult and follow-up US in 3-6 months, or MR with IV contrast for  improved characterization. Reference: Radiology 2019 Nov;293(2):359-371 Left ovary Measurements: 2.2 x 1.3 x 1.7 cm = volume: 2.5 mL. Normal morphology without mass Other findings No free pelvic fluid.  No adnexal masses. IMPRESSION: 5.7 cm diameter RIGHT ovarian simple cyst concerning for a low-grade cystic neoplasm; recommend either gynecology consult and follow-up US in 3-6 months, or MR with IV contrast for improved characterization. Reference: Radiology 2019 Nov;293(2):359-371 Remainder of exam unremarkable. Electronically Signed   By: Lavonia Dana M.D.   On: 06/03/2021 12:51    ASSESSMENT & PLAN:   #1 CLL Previous CLL prognostic FISH panel without detectable mutations. Has been Rai stage 0.. Now with new anemia and severe thrombocytopenia. WBC counts have increased significantly to 84k from 39k 3 months ago.  Concerning for change in tempo of her disease. -08/31/2019 CT C/A/P ([id number]) ([id number]) revealed "1. Mild multistation lymphadenopathy in the retroperitoneum and bilateral pelvis as detailed, compatible with lymphoma. Normal size spleen. No thoracic adenopathy."  #2 s/p severe thrombocytopenia with platelet counts around 5k likely related to CLL.  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  However cannot rule out bone marrow involvement as an etiology of thrombocytopenia as well.  #3 new mild normocytic anemia.  Hemoglobin 11.7 with an MCV of 97.4. --- now resolved hgb today 14.8 Likely related to her CLL. 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  #4 h/o intraparenchymal bleed- rpt CT head stable (due to uncontrolled HTN and thrombocytopenia)  PLAN: -Discussed pt labwork today, 05/29/2021 - CBC/diff--unremarkable platelets are normal at 166k -Continue to avoid all OTC NSAIDs except Tylenol. -No lab or clinical evidence of CLL progression at this time.  -The pt has no prohibitive toxicities from continuing 25  mg Promacta at this time. Will continue to monitor with labs and clinical examination. -Will see back in 2 months with labs.   FOLLOW UP: ne visit in 2 months Labs 1 day  prior to phone visit  . The total time spent in the appointment was 20 minutes and more than 50% was on counseling and direct patient cares.  All of the patient's questions were answered with apparent satisfaction. The patient knows to call the clinic with any problems, questions or concerns.   Sullivan Lone MD [place] AAHIVMS [facility] Hematology/Oncology Physician [facility]

## 2021-06-04 NOTE — Telephone Encounter (Signed)
Called the patient and discussed ultrasound from yesterday - no cahnge in size of simple cyst and no development of complex features. Patient remains asymptomatic. Will plan repeat ultrasound in 6 months and if stable, will discontinue surveillance imaging. Patient's questions answered, amenable to plan.  Jeral Pinch MD Gynecologic Oncology

## 2021-06-10 DIAGNOSIS — H52221 Regular astigmatism, right eye: Secondary | ICD-10-CM | POA: Diagnosis not present

## 2021-06-10 DIAGNOSIS — H401131 Primary open-angle glaucoma, bilateral, mild stage: Secondary | ICD-10-CM | POA: Diagnosis not present

## 2021-06-10 DIAGNOSIS — H524 Presbyopia: Secondary | ICD-10-CM | POA: Diagnosis not present

## 2021-06-10 DIAGNOSIS — H5213 Myopia, bilateral: Secondary | ICD-10-CM | POA: Diagnosis not present

## 2021-06-17 ENCOUNTER — Telehealth: Payer: Self-pay | Admitting: *Deleted

## 2021-06-17 NOTE — Telephone Encounter (Signed)
Patient called back and was given the date/time/instructions  for [place]

## 2021-06-17 NOTE — Telephone Encounter (Signed)
Scheduled the patient for an [place] on 12/02/2021. Called and left the patient a message to call the office back

## 2021-07-31 ENCOUNTER — Other Ambulatory Visit: Payer: Self-pay

## 2021-07-31 ENCOUNTER — Inpatient Hospital Stay: Payer: PPO | Attending: Hematology

## 2021-07-31 DIAGNOSIS — Z79899 Other long term (current) drug therapy: Secondary | ICD-10-CM | POA: Diagnosis not present

## 2021-07-31 DIAGNOSIS — D649 Anemia, unspecified: Secondary | ICD-10-CM | POA: Insufficient documentation

## 2021-07-31 DIAGNOSIS — C9110 Chronic lymphocytic leukemia of B-cell type not having achieved remission: Secondary | ICD-10-CM | POA: Insufficient documentation

## 2021-07-31 DIAGNOSIS — I10 Essential (primary) hypertension: Secondary | ICD-10-CM | POA: Insufficient documentation

## 2021-07-31 DIAGNOSIS — D693 Immune thrombocytopenic purpura: Secondary | ICD-10-CM | POA: Diagnosis not present

## 2021-07-31 LAB — CBC WITH DIFFERENTIAL/PLATELET
Abs Immature Granulocytes: 0.02 10*3/uL (ref 0.00–0.07)
Basophils Absolute: 0 10*3/uL (ref 0.0–0.1)
Basophils Relative: 1 %
Eosinophils Absolute: 0.2 10*3/uL (ref 0.0–0.5)
Eosinophils Relative: 4 %
HCT: 41.7 % (ref 36.0–46.0)
Hemoglobin: 14.5 g/dL (ref 12.0–15.0)
Immature Granulocytes: 0 %
Lymphocytes Relative: 46 %
Lymphs Abs: 2.8 10*3/uL (ref 0.7–4.0)
MCH: 32.4 pg (ref 26.0–34.0)
MCHC: 34.8 g/dL (ref 30.0–36.0)
MCV: 93.1 fL (ref 80.0–100.0)
Monocytes Absolute: 0.6 10*3/uL (ref 0.1–1.0)
Monocytes Relative: 10 %
Neutro Abs: 2.3 10*3/uL (ref 1.7–7.7)
Neutrophils Relative %: 39 %
Platelets: 109 10*3/uL — ABNORMAL LOW (ref 150–400)
RBC: 4.48 MIL/uL (ref 3.87–5.11)
RDW: 12.5 % (ref 11.5–15.5)
Smear Review: NORMAL
WBC: 6 10*3/uL (ref 4.0–10.5)
nRBC: 0 % (ref 0.0–0.2)

## 2021-08-03 ENCOUNTER — Other Ambulatory Visit: Payer: Self-pay

## 2021-08-03 ENCOUNTER — Inpatient Hospital Stay (HOSPITAL_BASED_OUTPATIENT_CLINIC_OR_DEPARTMENT_OTHER): Payer: PPO | Admitting: Hematology

## 2021-08-03 ENCOUNTER — Ambulatory Visit: Payer: PPO

## 2021-08-03 DIAGNOSIS — C9110 Chronic lymphocytic leukemia of B-cell type not having achieved remission: Secondary | ICD-10-CM | POA: Diagnosis not present

## 2021-08-03 DIAGNOSIS — D693 Immune thrombocytopenic purpura: Secondary | ICD-10-CM | POA: Diagnosis not present

## 2021-08-03 DIAGNOSIS — Z8679 Personal history of other diseases of the circulatory system: Secondary | ICD-10-CM | POA: Diagnosis not present

## 2021-08-03 MED ORDER — ELTROMBOPAG OLAMINE 25 MG PO TABS: 25.0000 mg | ORAL_TABLET | Freq: Every day | ORAL | 11 refills | Status: DC

## 2021-08-04 ENCOUNTER — Telehealth: Payer: Self-pay | Admitting: Pharmacist

## 2021-08-04 NOTE — Telephone Encounter (Signed)
Oral Oncology Pharmacist Encounter  Met with patient while in clinic to complete application for [employer] Patient [place] in an effort to reduce the patient's out of pocket expense for Promacta to $0.  Application completed and faxed to: [place] phone number for follow up is: [phone]  This encounter will be updated until final determination.   Leron Croak, PharmD, BCPS Hematology/Oncology Clinical Pharmacist [facility] [phone] 08/04/2021 8:26 AM

## 2021-08-07 ENCOUNTER — Telehealth: Payer: Self-pay | Admitting: Hematology

## 2021-08-07 NOTE — Telephone Encounter (Signed)
Scheduled follow-up appointment per 11/21 los. Patient's husband is aware.

## 2021-08-09 ENCOUNTER — Encounter: Payer: Self-pay | Admitting: Hematology

## 2021-08-09 NOTE — Progress Notes (Signed)
HEMATOLOGY/ONCOLOGY PHONE VISIT NOTE  Date of Service: .08/03/2021   Patient Care Team: [name], MD as PCP - General (Family Medicine) [name], MD as Consulting Physician (Hematology)  CHIEF COMPLAINTS/PURPOSE OF CONSULTATION:  F/u for CLL and ITP  HISTORY OF PRESENTING ILLNESS:   Selena Branch is a wonderful 80 y.o. female who has been transferred to [place] from Dr. Mathis Dad [place] for evaluation and management of CLL. The pt reports that she is doing well overall.  The pt reports that has been slightly fatigued in the past 1-2 months, but thinks it is from the heat. The fatigue does not affect her ability to function. Denies fevers, chills, and night sweats. She reports that she saw her PCP in August, who noticed that her blood counts were high and referred her to Dr. Irene Limbo.  Lab results today (05/31/2019) of CBC w diff and CMP is as follows: all values are WNL except for WBC at 39.1k, neutro abs at 1.6k, lymphs abs at 36.8k, monocytes abs at 0.0k, eosinophils abs at 0.8k, BUN at 27, Total protein at 6.4 05/31/2019 LDH is pending  On review of systems, pt reports mild fatigue and denies belly pain, infection issues and any other symptoms.   INTERVAL HISTORY:  .I connected with LAWRENCIA MAUNEY on .11/21/2022at  3:20 PM EST by telephone visit and verified that I am speaking with the correct person using two identifiers.   I discussed the limitations, risks, security and privacy concerns of performing an evaluation and management service by telemedicine and the availability of in-person appointments. I also discussed with the patient that there may be a patient responsible charge related to this service. The patient expressed understanding and agreed to proceed.   Other persons participating in the visit and their role in the encounter: none  Patient's location: Home Provider's location: [place]  Chief Complaint: Follow-up for [facility] and ITP  Selena Branch is a 80 y.o. 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 The patient's last visit with [place] was on 05/29/2021.  The patient reports she has been doing well and has no acute new symptoms.  Has been compliant with her Promacta dose.  Notes no toxicities from her Promacta. No issues with abnormal bleeding or bruising. No new infections. No fatigue. No fevers no chills no night sweats no unexpected weight loss.  Labs done on 07/31/2021 CBC shows WBC count of 6k hemoglobin of 14.5 and platelet count of 109k.    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  SOCIAL HISTORY: Social History   Socioeconomic History   Marital status: Married    Spouse name: Not on file   Number of children: 1   Years of education: HS   Highest education level: Not on file  Occupational History   Occupation: Retired  Tobacco Use   Smoking status: Never   Smokeless tobacco: Never  Vaping Use   Vaping Use: Never used  Substance and Sexual Activity   Alcohol use: No    Alcohol/week: 0.0 standard drinks   Drug use: No   Sexual activity: Yes    Birth control/protection: Post-menopausal  Other Topics Concern   Not on file  Social History Narrative  Drinks 2 cups of coffee   Social Determinants of Radio broadcast assistant Strain: Not on file  Food Insecurity: Not on file  Transportation Needs: Not on file  Physical Activity: Not on file  Stress: Not on file  Social Connections: Not on file  Intimate Partner Violence: Not on file    FAMILY HISTORY: Family History  Problem Relation Age of Onset   Stroke Mother    Lung cancer Father    Leukemia Neg Hx    Breast cancer Neg Hx    Ovarian cancer Neg Hx    Colon cancer Neg Hx    Endometrial cancer Neg Hx    Prostate cancer Neg Hx     Pancreatic cancer Neg Hx     ALLERGIES:  is allergic to codeine, evista [raloxifene], alendronate, ibandronic acid, and risedronate.  MEDICATIONS:  Current Outpatient Medications  Medication Sig Dispense Refill   amLODipine (NORVASC) 10 MG tablet Take 1 tablet (10 mg total) by mouth at bedtime. 30 tablet 0   atorvastatin (LIPITOR) 40 MG tablet Take 40 mg by mouth daily.      Brinzolamide-Brimonidine (SIMBRINZA) 1-0.2 % SUSP Place 1 drop into both eyes daily at 12 noon. (Patient not taking: Reported on 01/06/2021)     Calcium Carbonate-Vitamin D (CALCIUM-D PO) Take 600 mg by mouth daily.     Cholecalciferol (VITAMIN D3) 2000 units TABS Take 2,000 Units by mouth daily.      dorzolamide-timolol (COSOPT) 22.3-6.8 MG/ML ophthalmic solution Place 1 drop into both eyes daily.      eltrombopag (PROMACTA) 25 MG tablet Take 1 tablet (25 mg total) by mouth daily. Take on an empty stomach, 1 hour before a meal or 2 hours after. 30 tablet 11   furosemide (LASIX) 20 MG tablet Take 40 mg for 3 days then resume your 20 mg daily (Patient taking differently: Take 20 mg by mouth daily.) 30 tablet    latanoprost (XALATAN) 0.005 % ophthalmic solution Place 1 drop into both eyes at bedtime.      Multiple Vitamin (MULTIVITAMIN WITH MINERALS) TABS tablet Take 1 tablet by mouth daily.     quinapril (ACCUPRIL) 40 MG tablet Take 60 mg by mouth daily.     sertraline (ZOLOFT) 25 MG tablet Take 25 mg by mouth daily.     Current Facility-Administered Medications  Medication Dose Route Frequency Provider Last Rate Last Admin   0.9 %  sodium chloride infusion   Intravenous PRN Kathyrn Lass, MD        REVIEW OF SYSTEMS:   .10 Point review of Systems was done is negative except as noted above.  PHYSICAL EXAMINATION: Telemedicine visit  LABORATORY DATA:  I have reviewed the data as listed  . CBC Latest Ref Rng & Units 07/31/2021 05/29/2021 03/24/2021  WBC 4.0 - 10.5 K/uL 6.0 6.0 5.9  Hemoglobin 12.0 - 15.0 g/dL  14.5 14.3 14.0  Hematocrit 36.0 - 46.0 % 41.7 41.9 41.1  Platelets 150 - 400 K/uL 109(L) 166 72(L)    . CMP Latest Ref Rng & Units 05/29/2021 01/23/2021 12/11/2020  Glucose 70 - 99 mg/dL 94 91 97  BUN 8 - 23 mg/dL 13 17 14   Creatinine 0.44 - 1.00 mg/dL 0.85 0.84 0.80  Sodium 135 - 145 mmol/L 145 143 143  Potassium 3.5 - 5.1 mmol/L 4.4 4.1 4.3  Chloride 98 - 111 mmol/L 107 108 104  CO2 22 - 32 mmol/L 30 27 29   Calcium 8.9 - 10.3 mg/dL 9.9 9.6 9.2  Total Protein 6.5 - 8.1 g/dL 6.7 6.3(L) 6.7  Total Bilirubin 0.3 - 1.2 mg/dL 0.5 0.6 0.4  Alkaline Phos 38 - 126 U/L 81 77 85  AST 15 - 41 U/L 19 19 18   ALT 0 - 44 U/L 11 6 9    02/27/2019 FISH:    RADIOGRAPHIC STUDIES: I have personally reviewed the radiological images as listed and agreed with the findings in the report.  No results found.  ASSESSMENT & PLAN:   #1 CLL Previous CLL prognostic FISH panel without detectable mutations. Has been Rai stage 0.. Now with new anemia and severe thrombocytopenia. WBC counts have increased significantly to 84k from 39k 3 months ago.  Concerning for change in tempo of her disease. -08/31/2019 CT C/A/P ([id number]) ([id number]) revealed "1. Mild multistation lymphadenopathy in the retroperitoneum and bilateral pelvis as detailed, compatible with lymphoma. Normal size spleen. No thoracic adenopathy."  #2 s/p severe thrombocytopenia with platelet counts around 5k likely related to CLL.  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  However cannot rule out bone marrow involvement as an etiology of thrombocytopenia as well.  #3 new mild normocytic anemia.  Hemoglobin 11.7 with an MCV of 97.4. --- now resolved hgb today 14.8 Likely related to her CLL. 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  #4 h/o intraparenchymal bleed- rpt CT head stable (due to uncontrolled HTN and thrombocytopenia)  PLAN: -Discussed pt labwork today,  07/31/2021- CBC/diff--stable, platelets of 109k, normal WBC count and hemoglobin. -Continue to avoid all OTC NSAIDs except Tylenol. -No clinical or lab evidence of CLL progression at this time. 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 -Patient has no limiting toxicities from continuing Promacta at 25 mg p.o. daily. -Will see back in 3 months with labs.   FOLLOW UP: RTC with Dr Irene Limbo with labs in 3 months  . The total time spent in the appointment was 20 minutes and more than 50% was on counseling and direct patient cares.  All of the patient's questions were answered with apparent satisfaction. The patient knows to call the clinic with any problems, questions or concerns.   Sullivan Lone MD [place] AAHIVMS [facility] Hematology/Oncology Physician [facility]

## 2021-09-16 DIAGNOSIS — H524 Presbyopia: Secondary | ICD-10-CM | POA: Diagnosis not present

## 2021-09-16 DIAGNOSIS — H40053 Ocular hypertension, bilateral: Secondary | ICD-10-CM | POA: Diagnosis not present

## 2021-09-16 DIAGNOSIS — H40013 Open angle with borderline findings, low risk, bilateral: Secondary | ICD-10-CM | POA: Diagnosis not present

## 2021-09-16 DIAGNOSIS — H5213 Myopia, bilateral: Secondary | ICD-10-CM | POA: Diagnosis not present

## 2021-09-16 DIAGNOSIS — H52221 Regular astigmatism, right eye: Secondary | ICD-10-CM | POA: Diagnosis not present

## 2021-09-22 NOTE — Telephone Encounter (Signed)
Patient is approved for Promacta at no charge from [employer] 09/19/21-09/12/22  [employer] uses Rx [place] Patient [place] Phone [phone] Fax [phone] 09/22/2021 6:21 PM

## 2021-10-21 ENCOUNTER — Ambulatory Visit
Admission: RE | Admit: 2021-10-21 | Discharge: 2021-10-21 | Disposition: A | Discharge status: Home / Self Care | Payer: PPO | Source: Ambulatory Visit | Attending: Family Medicine | Admitting: Family Medicine

## 2021-10-21 DIAGNOSIS — M810 Age-related osteoporosis without current pathological fracture: Secondary | ICD-10-CM | POA: Diagnosis not present

## 2021-10-21 DIAGNOSIS — Z780 Asymptomatic menopausal state: Secondary | ICD-10-CM | POA: Diagnosis not present

## 2021-10-21 DIAGNOSIS — M85852 Other specified disorders of bone density and structure, left thigh: Secondary | ICD-10-CM | POA: Diagnosis not present

## 2021-11-02 ENCOUNTER — Ambulatory Visit: Payer: PPO | Admitting: Hematology

## 2021-11-02 ENCOUNTER — Other Ambulatory Visit: Payer: PPO

## 2021-11-03 DIAGNOSIS — Z23 Encounter for immunization: Secondary | ICD-10-CM | POA: Diagnosis not present

## 2021-11-03 DIAGNOSIS — F4320 Adjustment disorder, unspecified: Secondary | ICD-10-CM | POA: Diagnosis not present

## 2021-11-03 DIAGNOSIS — C9110 Chronic lymphocytic leukemia of B-cell type not having achieved remission: Secondary | ICD-10-CM | POA: Diagnosis not present

## 2021-11-03 DIAGNOSIS — E782 Mixed hyperlipidemia: Secondary | ICD-10-CM | POA: Diagnosis not present

## 2021-11-03 DIAGNOSIS — I10 Essential (primary) hypertension: Secondary | ICD-10-CM | POA: Diagnosis not present

## 2021-11-03 DIAGNOSIS — H409 Unspecified glaucoma: Secondary | ICD-10-CM | POA: Diagnosis not present

## 2021-11-03 DIAGNOSIS — M810 Age-related osteoporosis without current pathological fracture: Secondary | ICD-10-CM | POA: Diagnosis not present

## 2021-11-06 ENCOUNTER — Inpatient Hospital Stay (HOSPITAL_BASED_OUTPATIENT_CLINIC_OR_DEPARTMENT_OTHER): Payer: PPO | Admitting: Hematology

## 2021-11-06 ENCOUNTER — Other Ambulatory Visit: Payer: Self-pay

## 2021-11-06 ENCOUNTER — Inpatient Hospital Stay: Payer: PPO | Attending: Hematology

## 2021-11-06 DIAGNOSIS — D649 Anemia, unspecified: Secondary | ICD-10-CM | POA: Insufficient documentation

## 2021-11-06 DIAGNOSIS — Z79899 Other long term (current) drug therapy: Secondary | ICD-10-CM | POA: Insufficient documentation

## 2021-11-06 DIAGNOSIS — C9110 Chronic lymphocytic leukemia of B-cell type not having achieved remission: Secondary | ICD-10-CM | POA: Diagnosis not present

## 2021-11-06 DIAGNOSIS — D693 Immune thrombocytopenic purpura: Secondary | ICD-10-CM | POA: Insufficient documentation

## 2021-11-06 LAB — CMP (CANCER CENTER ONLY)
ALT: 10 U/L (ref 0–44)
AST: 22 U/L (ref 15–41)
Albumin: 4.6 g/dL (ref 3.5–5.0)
Alkaline Phosphatase: 76 U/L (ref 38–126)
Anion gap: 5 (ref 5–15)
BUN: 17 mg/dL (ref 8–23)
CO2: 29 mmol/L (ref 22–32)
Calcium: 9.8 mg/dL (ref 8.9–10.3)
Chloride: 107 mmol/L (ref 98–111)
Creatinine: 0.83 mg/dL (ref 0.44–1.00)
GFR, Estimated: >60 mL/min (ref >60)
Glucose, Bld: 88 mg/dL (ref 70–99)
Potassium: 4.4 mmol/L (ref 3.5–5.1)
Sodium: 141 mmol/L (ref 135–145)
Total Bilirubin: 0.6 mg/dL (ref 0.3–1.2)
Total Protein: 7 g/dL (ref 6.5–8.1)

## 2021-11-06 LAB — CBC WITH DIFFERENTIAL/PLATELET
Abs Immature Granulocytes: 0.01 10*3/uL (ref 0.00–0.07)
Basophils Absolute: 0 10*3/uL (ref 0.0–0.1)
Basophils Relative: 1 %
Eosinophils Absolute: 0.2 10*3/uL (ref 0.0–0.5)
Eosinophils Relative: 3 %
HCT: 45.4 % (ref 36.0–46.0)
Hemoglobin: 15.4 g/dL — ABNORMAL HIGH (ref 12.0–15.0)
Immature Granulocytes: 0 %
Lymphocytes Relative: 47 %
Lymphs Abs: 2.8 10*3/uL (ref 0.7–4.0)
MCH: 32.2 pg (ref 26.0–34.0)
MCHC: 33.9 g/dL (ref 30.0–36.0)
MCV: 94.8 fL (ref 80.0–100.0)
Monocytes Absolute: 0.5 10*3/uL (ref 0.1–1.0)
Monocytes Relative: 9 %
Neutro Abs: 2.4 10*3/uL (ref 1.7–7.7)
Neutrophils Relative %: 40 %
Platelets: 143 10*3/uL — ABNORMAL LOW (ref 150–400)
RBC: 4.79 MIL/uL (ref 3.87–5.11)
RDW: 12.6 % (ref 11.5–15.5)
WBC: 5.9 10*3/uL (ref 4.0–10.5)
nRBC: 0 % (ref 0.0–0.2)

## 2021-11-06 LAB — LACTATE DEHYDROGENASE: LDH: 163 U/L (ref 98–192)

## 2021-11-12 ENCOUNTER — Encounter: Payer: Self-pay | Admitting: Hematology

## 2021-11-12 NOTE — Progress Notes (Signed)
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 She is accompanied by her daughter for this visit. She notes that she has been doing well overall and has had no acute new symptoms. Daughter does endorse some issues with decreased cognitive abilities and memory but generally has been functioning okay. Follows with her primary doctor for management of osteoporosis.  She notes no fevers no chills no night sweats. No acute new shortness of breath or chest pain. No abdominal pain or distention. No abnormal bleeding or bruising.  No report of toxicities at her current dose of Promacta at this time.  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  SOCIAL HISTORY: Social History   Socioeconomic History   Marital status: Married    Spouse name: Not on file   Number of children: 1   Years of education: HS   Highest education level: Not on file  Occupational History   Occupation: Retired  Tobacco Use   Smoking status: Never   Smokeless tobacco: Never  Vaping Use   Vaping Use: Never used  Substance and Sexual Activity   Alcohol use: No    Alcohol/week: 0.0 standard drinks   Drug use: No   Sexual activity: Yes    Birth control/protection: Post-menopausal  Other  Topics Concern   Not on file  Social History Narrative   Drinks 2 cups of coffee   Social Determinants of Health   Financial Resource Strain: Not on file  Food Insecurity: Not on file  Transportation Needs: Not on file  Physical Activity: Not on file  Stress: Not on file  Social Connections: Not on file  Intimate Partner Violence: Not on file    FAMILY HISTORY: Family History  Problem Relation Age of Onset   Stroke Mother    Lung cancer Father    Leukemia Neg Hx    Breast cancer Neg Hx    Ovarian cancer Neg Hx    Colon cancer Neg Hx    Endometrial cancer Neg Hx    Prostate cancer Neg Hx    Pancreatic cancer Neg Hx     ALLERGIES:  is allergic to codeine, evista [raloxifene], alendronate, ibandronic acid, and risedronate.  MEDICATIONS:  Current Outpatient Medications  Medication Sig Dispense Refill   amLODipine (NORVASC) 10 MG tablet Take 1 tablet (10 mg total) by mouth at bedtime. 30 tablet 0   atorvastatin (LIPITOR) 40 MG tablet Take 40 mg by mouth daily.      Brinzolamide-Brimonidine (SIMBRINZA) 1-0.2 % SUSP Place 1 drop into both eyes daily at 12 noon. (Patient not taking: Reported on 01/06/2021)     Calcium Carbonate-Vitamin D (CALCIUM-D PO) Take 600 mg by mouth daily.     Cholecalciferol (VITAMIN D3) 2000 units TABS Take 2,000 Units by mouth daily.  dorzolamide-timolol (COSOPT) 22.3-6.8 MG/ML ophthalmic solution Place 1 drop into both eyes daily.      eltrombopag (PROMACTA) 25 MG tablet Take 1 tablet (25 mg total) by mouth daily. Take on an empty stomach, 1 hour before a meal or 2 hours after. 30 tablet 11   furosemide (LASIX) 20 MG tablet Take 40 mg for 3 days then resume your 20 mg daily (Patient taking differently: Take 20 mg by mouth daily.) 30 tablet    ibandronate (BONIVA) 150 MG tablet Take 150 mg by mouth every morning.     latanoprost (XALATAN) 0.005 % ophthalmic solution Place 1 drop into both eyes at bedtime.      Multiple Vitamin (MULTIVITAMIN WITH  MINERALS) TABS tablet Take 1 tablet by mouth daily.     quinapril (ACCUPRIL) 40 MG tablet Take 60 mg by mouth daily.     sertraline (ZOLOFT) 25 MG tablet Take 25 mg by mouth daily.     Current Facility-Administered Medications  Medication Dose Route Frequency Provider Last Rate Last Admin   0.9 %  sodium chloride infusion   Intravenous PRN Kathyrn Lass, MD        REVIEW OF SYSTEMS:   10 Point review of Systems was done is negative except as noted above.  PHYSICAL EXAMINATION: Telemedicine visit  LABORATORY DATA:  I have reviewed the data as listed  . CBC Latest Ref Rng & Units 11/06/2021 07/31/2021 05/29/2021  WBC 4.0 - 10.5 K/uL 5.9 6.0 6.0  Hemoglobin 12.0 - 15.0 g/dL 15.4(H) 14.5 14.3  Hematocrit 36.0 - 46.0 % 45.4 41.7 41.9  Platelets 150 - 400 K/uL 143(L) 109(L) 166    . CMP Latest Ref Rng & Units 11/06/2021 05/29/2021 01/23/2021  Glucose 70 - 99 mg/dL 88 94 91  BUN 8 - 23 mg/dL 17 13 17   Creatinine 0.44 - 1.00 mg/dL 0.83 0.85 0.84  Sodium 135 - 145 mmol/L 141 145 143  Potassium 3.5 - 5.1 mmol/L 4.4 4.4 4.1  Chloride 98 - 111 mmol/L 107 107 108  CO2 22 - 32 mmol/L 29 30 27   Calcium 8.9 - 10.3 mg/dL 9.8 9.9 9.6  Total Protein 6.5 - 8.1 g/dL 7.0 6.7 6.3(L)  Total Bilirubin 0.3 - 1.2 mg/dL 0.6 0.5 0.6  Alkaline Phos 38 - 126 U/L 76 81 77  AST 15 - 41 U/L 22 19 19   ALT 0 - 44 U/L 10 11 6    . Lab Results  Component Value Date   LDH 163 11/06/2021    02/27/2019 FISH:    RADIOGRAPHIC STUDIES: I have personally reviewed the radiological images as listed and agreed with the findings in the report.  DG BONE DENSITY (DXA)  Result Date: 10/21/2021 EXAM: DUAL X-RAY ABSORPTIOMETRY (DXA) FOR BONE MINERAL DENSITY IMPRESSION: Referring Physician:  [facility] MCNEILL Your patient completed a bone mineral density test using GE Lunar iDXA system (analysis version: 16). Technologist: [place] PATIENT: Name: Selena Branch, Selena Branch Patient ID: [id number] Birth Date: Jan 18, 1941 Height: 57.5 in. Sex:  Female Measured: 10/21/2021 Weight: 105.0 lbs. Indications: Advanced Age, Caucasian, Estrogen Deficient, History of Osteoporosis, Postmenopausal Fractures: None Treatments: Calcium (E943.0), Vitamin D (E933.5) ASSESSMENT: The BMD measured at AP Spine L1-L2 is 0.818 g/cm2 with a T-score of -2.9. This patient is considered osteoporotic according to [facility]) criteria. The quality of the exam is good. L3, L4 was excluded due to degenerative changes. Site Region Measured Date Measured Age YA BMD Significant CHANGE T-score AP Spine L1-L2 10/21/2021 81.9 -2.9 0.818 g/cm2 *  AP Spine  L1-L2      07/02/2019    79.5         -2.1    0.916 g/cm2 DualFemur Neck Left  10/21/2021    81.9         -2.2    0.731 g/cm2 DualFemur Neck Left  07/02/2019    79.5         -2.2    0.737 g/cm2 DualFemur Total Mean 10/21/2021 81.9 -1.7 0.798 g/cm2 * DualFemur Total Mean 07/02/2019    79.5         -1.2    0.851 g/cm2 World Health Organization [facility]) criteria for post-menopausal, Caucasian Women: Normal       T-score at or above -1 SD Osteopenia   T-score between -1 and -2.5 SD Osteoporosis T-score at or below -2.5 SD RECOMMENDATION: 1. All patients should optimize calcium and vitamin D intake. 2. Consider FDA-approved medical therapies in postmenopausal women and men aged 41 years and older, based on the following: a. A hip or vertebral (clinical or morphometric) fracture. b. T-score = -2.5 at the femoral neck or spine after appropriate evaluation to exclude secondary causes. c. Low bone mass (T-score between -1.0 and -2.5 at the femoral neck or spine) and a 10-year probability of a hip fracture = 3% or a 10-year probability of a major osteoporosis-related fracture = 20% based on the [place]-adapted WHO algorithm. d. Clinician judgment and/or patient preferences may indicate treatment for people with 10-year fracture probabilities above or below these levels. FOLLOW-UP: Patients with diagnosis of osteoporosis or at high risk for  fracture should have regular bone mineral density tests.? Patients eligible for Medicare are allowed routine testing every 2 years.? The testing frequency can be increased to one year for patients who have rapidly progressing disease, are receiving or discontinuing medical therapy to restore bone mass, or have additional risk factors. I have reviewed this study and agree with the findings. Mark A. Thornton Papas, M.D. [facility] Electronically Signed   By: Lavonia Dana M.D.   On: 10/21/2021 10:22    ASSESSMENT & PLAN:   #1 CLL Previous CLL prognostic FISH panel without detectable mutations. Has been Rai stage 0.. Now with new anemia and severe thrombocytopenia. WBC counts have increased significantly to 84k from 39k 3 months ago.  Concerning for change in tempo of her disease. -08/31/2019 CT C/A/P ([id number]) ([id number]) revealed "1. Mild multistation lymphadenopathy in the retroperitoneum and bilateral pelvis as detailed, compatible with lymphoma. Normal size spleen. No thoracic adenopathy."  #2 s/p severe thrombocytopenia with platelet counts around 5k likely related to CLL.  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  However cannot rule out bone marrow involvement as an etiology of thrombocytopenia as well.  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  Currently no anemia.  #4 h/o intraparenchymal bleed- rpt CT head stable (due to uncontrolled HTN and thrombocytopenia)  PLAN: -Discussed lab results from today. CBC shows hemoglobin of 15.4, WBC count of 5.9K and platelets of 143 K. CMP within normal limits LDH within normal limits at 163 Patient has no clinical or laboratory evidence of CLL progression at this time. Patient has no limiting toxicities from continuing Promacta at 25 mg p.o. daily Labs show stable platelet count of more than 50 K.  FOLLOW UP: Return to clinic with Dr. Irene Limbo with labs in 3 months  The total time  spent in the appointment was 23 minutes*.  All of the patient's questions were answered with apparent satisfaction. The patient knows to call the clinic with any problems, questions or concerns.   Sullivan Lone MD MS AAHIVMS [facility] Hematology/Oncology Physician [facility]  .*Total Encounter Time as defined by the Centers for Medicare and Medicaid Services includes, in addition to the face-to-face time of a patient visit (documented in the note above) non-face-to-face time: obtaining and reviewing outside history, ordering and reviewing medications, tests or procedures, care coordination (communications with other health care professionals or caregivers) and documentation in the medical record.

## 2021-11-30 ENCOUNTER — Encounter: Payer: Self-pay | Admitting: Hematology

## 2021-12-02 ENCOUNTER — Other Ambulatory Visit: Payer: Self-pay

## 2021-12-02 ENCOUNTER — Ambulatory Visit (HOSPITAL_COMMUNITY)
Admission: RE | Admit: 2021-12-02 | Discharge: 2021-12-02 | Disposition: A | Discharge status: Home / Self Care | Payer: PPO | Source: Ambulatory Visit | Attending: Gynecologic Oncology | Admitting: Gynecologic Oncology

## 2021-12-02 DIAGNOSIS — N9489 Other specified conditions associated with female genital organs and menstrual cycle: Secondary | ICD-10-CM | POA: Diagnosis not present

## 2021-12-02 DIAGNOSIS — N83201 Unspecified ovarian cyst, right side: Secondary | ICD-10-CM | POA: Diagnosis not present

## 2021-12-10 ENCOUNTER — Telehealth: Payer: Self-pay | Admitting: Gynecologic Oncology

## 2021-12-10 DIAGNOSIS — N9489 Other specified conditions associated with female genital organs and menstrual cycle: Secondary | ICD-10-CM

## 2021-12-10 NOTE — Telephone Encounter (Signed)
I called the patient and reviewed ultrasound findings.  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  There is some internal heterogeneity, which may be related to having bled a little bit inside the cyst.  No mural nodularity, septations, or other complex features.  Overall, my concern from a malignancy standpoint is still very low.  I do not think that this requires surgical intervention and the patient still wishes to refrain from any surgery if at all possible.  I recommend that we follow with a pelvic ultrasound in 6 months to assess for any change in size or character to the cyst.  Patient amenable.  Order placed. ? ?Jeral Pinch MD ?Gynecologic Oncology ? ?

## 2021-12-11 ENCOUNTER — Encounter: Payer: Self-pay | Admitting: Hematology

## 2021-12-15 DIAGNOSIS — H401121 Primary open-angle glaucoma, left eye, mild stage: Secondary | ICD-10-CM | POA: Diagnosis not present

## 2021-12-15 DIAGNOSIS — H5203 Hypermetropia, bilateral: Secondary | ICD-10-CM | POA: Diagnosis not present

## 2021-12-28 ENCOUNTER — Telehealth: Payer: Self-pay | Admitting: *Deleted

## 2021-12-28 NOTE — Telephone Encounter (Signed)
Per Dr Berline Lopes called and scheduled an [place] for 9/25 at 10:30 am. Called and gave the patient appt date/time and instructions to arrive with a full bladder at 10:15 am at the main hospital. Explained that closer to the scan date the office will set her up with a phone visit after with Dr Berline Lopes  ?

## 2022-02-03 ENCOUNTER — Other Ambulatory Visit: Payer: Self-pay

## 2022-02-03 DIAGNOSIS — C9110 Chronic lymphocytic leukemia of B-cell type not having achieved remission: Secondary | ICD-10-CM

## 2022-02-04 ENCOUNTER — Inpatient Hospital Stay: Payer: PPO | Attending: Hematology

## 2022-02-04 ENCOUNTER — Other Ambulatory Visit: Payer: Self-pay

## 2022-02-04 DIAGNOSIS — C9110 Chronic lymphocytic leukemia of B-cell type not having achieved remission: Secondary | ICD-10-CM | POA: Insufficient documentation

## 2022-02-04 DIAGNOSIS — D693 Immune thrombocytopenic purpura: Secondary | ICD-10-CM | POA: Insufficient documentation

## 2022-02-04 LAB — CMP (CANCER CENTER ONLY)
ALT: 10 U/L (ref 0–44)
AST: 19 U/L (ref 15–41)
Albumin: 4.3 g/dL (ref 3.5–5.0)
Alkaline Phosphatase: 55 U/L (ref 38–126)
Anion gap: 4 — ABNORMAL LOW (ref 5–15)
BUN: 19 mg/dL (ref 8–23)
CO2: 30 mmol/L (ref 22–32)
Calcium: 9.3 mg/dL (ref 8.9–10.3)
Chloride: 108 mmol/L (ref 98–111)
Creatinine: 0.89 mg/dL (ref 0.44–1.00)
GFR, Estimated: >60 mL/min (ref >60)
Glucose, Bld: 95 mg/dL (ref 70–99)
Potassium: 4 mmol/L (ref 3.5–5.1)
Sodium: 142 mmol/L (ref 135–145)
Total Bilirubin: 0.6 mg/dL (ref 0.3–1.2)
Total Protein: 6.6 g/dL (ref 6.5–8.1)

## 2022-02-04 LAB — CBC WITH DIFFERENTIAL (CANCER CENTER ONLY)
Abs Immature Granulocytes: 0.01 10*3/uL (ref 0.00–0.07)
Basophils Absolute: 0 10*3/uL (ref 0.0–0.1)
Basophils Relative: 0 %
Eosinophils Absolute: 0.2 10*3/uL (ref 0.0–0.5)
Eosinophils Relative: 3 %
HCT: 40.9 % (ref 36.0–46.0)
Hemoglobin: 14.4 g/dL (ref 12.0–15.0)
Immature Granulocytes: 0 %
Lymphocytes Relative: 51 %
Lymphs Abs: 2.9 10*3/uL (ref 0.7–4.0)
MCH: 33.2 pg (ref 26.0–34.0)
MCHC: 35.2 g/dL (ref 30.0–36.0)
MCV: 94.2 fL (ref 80.0–100.0)
Monocytes Absolute: 0.6 10*3/uL (ref 0.1–1.0)
Monocytes Relative: 9 %
Neutro Abs: 2.2 10*3/uL (ref 1.7–7.7)
Neutrophils Relative %: 37 %
Platelet Count: 150 10*3/uL (ref 150–400)
RBC: 4.34 MIL/uL (ref 3.87–5.11)
RDW: 12.5 % (ref 11.5–15.5)
WBC Count: 5.8 10*3/uL (ref 4.0–10.5)
nRBC: 0 % (ref 0.0–0.2)

## 2022-02-04 LAB — LACTATE DEHYDROGENASE: LDH: 139 U/L (ref 98–192)

## 2022-02-05 ENCOUNTER — Inpatient Hospital Stay (HOSPITAL_BASED_OUTPATIENT_CLINIC_OR_DEPARTMENT_OTHER): Payer: PPO | Admitting: Hematology

## 2022-02-05 DIAGNOSIS — D693 Immune thrombocytopenic purpura: Secondary | ICD-10-CM

## 2022-02-05 DIAGNOSIS — C9110 Chronic lymphocytic leukemia of B-cell type not having achieved remission: Secondary | ICD-10-CM

## 2022-02-09 ENCOUNTER — Telehealth: Payer: Self-pay | Admitting: Hematology

## 2022-02-09 NOTE — Telephone Encounter (Signed)
Scheduled follow-up appointment per 5/26 los. Patient is aware.

## 2022-02-11 ENCOUNTER — Encounter: Payer: Self-pay | Admitting: Hematology

## 2022-02-11 NOTE — Progress Notes (Signed)
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   I discussed the limitations, risks, security and privacy concerns of performing an evaluation and management service by telemedicine and the availability of in-person appointments. I also discussed with the patient that there may be a patient responsible charge related to this service. The patient expressed understanding and agreed to proceed.   Other persons participating in the visit and their role in the encounter: None  Patient's location: Home Provider's location: [facility]  Chief Complaint: Lab discussion and follow-up for CLL and chronic ITP.  Patient notes no new acute issues or new symptoms since her last clinic visit.  No issues with bleeding or abnormal bruising.  No new fatigue.  No infection issues. No fevers chills night sweats or new lumps or bumps. 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  SOCIAL HISTORY: Social History   Socioeconomic History   Marital status: Married    Spouse name: Not on file   Number of children: 1   Years of education: HS   Highest education level: Not on file   Occupational History   Occupation: Retired  Tobacco Use   Smoking status: Never   Smokeless tobacco: Never  Vaping Use   Vaping Use: Never used  Substance and Sexual Activity   Alcohol use: No    Alcohol/week: 0.0 standard drinks   Drug use: No   Sexual activity: Yes    Birth control/protection: Post-menopausal  Other Topics Concern   Not on file  Social History Narrative   Drinks 2 cups of coffee   Social Determinants of Health   Financial Resource Strain: Not on file  Food Insecurity: Not on file  Transportation Needs: Not on file  Physical Activity: Not on file  Stress: Not on file  Social Connections: Not on file  Intimate Partner Violence: Not on file    FAMILY HISTORY: Family History  Problem Relation Age of Onset   Stroke Mother    Lung cancer Father    Leukemia Neg Hx    Breast cancer Neg Hx    Ovarian cancer Neg Hx    Colon cancer Neg Hx    Endometrial cancer Neg Hx    Prostate cancer Neg Hx    Pancreatic cancer Neg Hx     ALLERGIES:  is allergic to codeine, evista [raloxifene], alendronate, ibandronic acid, and risedronate.  MEDICATIONS:  Current Outpatient Medications  Medication Sig Dispense Refill   amLODipine (NORVASC) 10 MG tablet Take 1 tablet (10 mg total) by mouth at bedtime. 30 tablet 0   atorvastatin (LIPITOR) 40 MG tablet Take 40 mg by mouth daily.      Brinzolamide-Brimonidine (  SIMBRINZA) 1-0.2 % SUSP Place 1 drop into both eyes daily at 12 noon. (Patient not taking: Reported on 01/06/2021)     Calcium Carbonate-Vitamin D (CALCIUM-D PO) Take 600 mg by mouth daily.     Cholecalciferol (VITAMIN D3) 2000 units TABS Take 2,000 Units by mouth daily.      dorzolamide-timolol (COSOPT) 22.3-6.8 MG/ML ophthalmic solution Place 1 drop into both eyes daily.      eltrombopag (PROMACTA) 25 MG tablet Take 1 tablet (25 mg total) by mouth daily. Take on an empty stomach, 1 hour before a meal or 2 hours after. 30 tablet 11   furosemide (LASIX) 20 MG tablet  Take 40 mg for 3 days then resume your 20 mg daily (Patient taking differently: Take 20 mg by mouth daily.) 30 tablet    ibandronate (BONIVA) 150 MG tablet Take 150 mg by mouth every morning.     latanoprost (XALATAN) 0.005 % ophthalmic solution Place 1 drop into both eyes at bedtime.      Multiple Vitamin (MULTIVITAMIN WITH MINERALS) TABS tablet Take 1 tablet by mouth daily.     quinapril (ACCUPRIL) 40 MG tablet Take 60 mg by mouth daily.     sertraline (ZOLOFT) 25 MG tablet Take 25 mg by mouth daily.     Current Facility-Administered Medications  Medication Dose Route Frequency Provider Last Rate Last Admin   0.9 %  sodium chloride infusion   Intravenous PRN Kathyrn Lass, MD        REVIEW OF SYSTEMS:   10 Point review of Systems was done is negative except as noted above.  PHYSICAL EXAMINATION: Telemedicine visit  LABORATORY DATA:  I have reviewed the data as listed  .    Latest Ref Rng & Units 02/04/2022   10:20 AM 11/06/2021   10:25 AM 07/31/2021    8:30 AM  CBC  WBC 4.0 - 10.5 K/uL 5.8   5.9   6.0    Hemoglobin 12.0 - 15.0 g/dL 14.4   15.4   14.5    Hematocrit 36.0 - 46.0 % 40.9   45.4   41.7    Platelets 150 - 400 K/uL 150   143   109      .    Latest Ref Rng & Units 02/04/2022   10:20 AM 11/06/2021   10:25 AM 05/29/2021   10:54 AM  CMP  Glucose 70 - 99 mg/dL 95   88   94    BUN 8 - 23 mg/dL '19   17   13    '$ Creatinine 0.44 - 1.00 mg/dL 0.89   0.83   0.85    Sodium 135 - 145 mmol/L 142   141   145    Potassium 3.5 - 5.1 mmol/L 4.0   4.4   4.4    Chloride 98 - 111 mmol/L 108   107   107    CO2 22 - 32 mmol/L '30   29   30    '$ Calcium 8.9 - 10.3 mg/dL 9.3   9.8   9.9    Total Protein 6.5 - 8.1 g/dL 6.6   7.0   6.7    Total Bilirubin 0.3 - 1.2 mg/dL 0.6   0.6   0.5    Alkaline Phos 38 - 126 U/L 55   76   81    AST 15 - 41 U/L '19   22   19    '$ ALT 0 - 44 U/L 10   10  11     . Lab Results  Component Value Date   LDH 139 02/04/2022    02/27/2019  FISH:    RADIOGRAPHIC STUDIES: I have personally reviewed the radiological images as listed and agreed with the findings in the report.  No results found.  ASSESSMENT & PLAN:   #1 CLL Previous CLL prognostic FISH panel without detectable mutations. Has been Rai stage 0.. Now with new anemia and severe thrombocytopenia. WBC counts have increased significantly to 84k from 39k 3 months ago.  Concerning for change in tempo of her disease. -08/31/2019 CT C/A/P ([id number]) ([id number]) revealed "1. Mild multistation lymphadenopathy in the retroperitoneum and bilateral pelvis as detailed, compatible with lymphoma. Normal size spleen. No thoracic adenopathy."  #2 s/p severe thrombocytopenia with platelet counts around 5k likely related to CLL.  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  However cannot rule out bone marrow involvement as an etiology of thrombocytopenia as well.  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  Currently no anemia.  #4 h/o intraparenchymal bleed- rpt CT head stable (due to uncontrolled HTN and thrombocytopenia)  PLAN: -Discussed lab results from 02/04/2022 CBC shows hemoglobin of 14.4 with normal WBC count of 5.8k and normal platelets of 150k CMP within normal limits LDH within normal limits at 139 Patient has no clinical or lab evidence of CLL progression at this time. 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 Patient has no notable toxicities from her current dose of Promacta. Continue Promacta 25 mg p.o. daily  FOLLOW UP: Return to clinic with Dr. Irene Limbo with labs in 3 months  The total time spent in the appointment was 20 minutes*.  All of the patient's questions were answered with apparent satisfaction. The patient knows to call the clinic with any problems, questions or concerns.   Sullivan Lone MD MS AAHIVMS [facility] Hematology/Oncology Physician [facility]  .*Total Encounter Time as defined by the Centers for Medicare and Medicaid Services includes, in addition to the face-to-face time of a patient visit (documented in the note above) non-face-to-face time: obtaining and reviewing outside history, ordering and reviewing medications, tests or procedures, care coordination (communications with other health care professionals or caregivers) and documentation in the medical record.

## 2022-02-17 ENCOUNTER — Other Ambulatory Visit: Payer: Self-pay | Admitting: Oncology

## 2022-04-14 DIAGNOSIS — H5213 Myopia, bilateral: Secondary | ICD-10-CM | POA: Diagnosis not present

## 2022-04-14 DIAGNOSIS — H524 Presbyopia: Secondary | ICD-10-CM | POA: Diagnosis not present

## 2022-04-14 DIAGNOSIS — H401131 Primary open-angle glaucoma, bilateral, mild stage: Secondary | ICD-10-CM | POA: Diagnosis not present

## 2022-04-14 DIAGNOSIS — H52221 Regular astigmatism, right eye: Secondary | ICD-10-CM | POA: Diagnosis not present

## 2022-04-27 DIAGNOSIS — M810 Age-related osteoporosis without current pathological fracture: Secondary | ICD-10-CM | POA: Diagnosis not present

## 2022-04-27 DIAGNOSIS — I10 Essential (primary) hypertension: Secondary | ICD-10-CM | POA: Diagnosis not present

## 2022-04-27 DIAGNOSIS — E782 Mixed hyperlipidemia: Secondary | ICD-10-CM | POA: Diagnosis not present

## 2022-05-03 DIAGNOSIS — Z23 Encounter for immunization: Secondary | ICD-10-CM | POA: Diagnosis not present

## 2022-05-03 DIAGNOSIS — Z0000 Encounter for general adult medical examination without abnormal findings: Secondary | ICD-10-CM | POA: Diagnosis not present

## 2022-05-03 DIAGNOSIS — Z6822 Body mass index (BMI) 22.0-22.9, adult: Secondary | ICD-10-CM | POA: Diagnosis not present

## 2022-05-03 DIAGNOSIS — I10 Essential (primary) hypertension: Secondary | ICD-10-CM | POA: Diagnosis not present

## 2022-05-03 DIAGNOSIS — F4320 Adjustment disorder, unspecified: Secondary | ICD-10-CM | POA: Diagnosis not present

## 2022-05-03 DIAGNOSIS — E782 Mixed hyperlipidemia: Secondary | ICD-10-CM | POA: Diagnosis not present

## 2022-05-03 DIAGNOSIS — C9110 Chronic lymphocytic leukemia of B-cell type not having achieved remission: Secondary | ICD-10-CM | POA: Diagnosis not present

## 2022-05-03 DIAGNOSIS — R54 Age-related physical debility: Secondary | ICD-10-CM | POA: Diagnosis not present

## 2022-05-03 DIAGNOSIS — Z1389 Encounter for screening for other disorder: Secondary | ICD-10-CM | POA: Diagnosis not present

## 2022-05-03 DIAGNOSIS — M810 Age-related osteoporosis without current pathological fracture: Secondary | ICD-10-CM | POA: Diagnosis not present

## 2022-05-03 DIAGNOSIS — H409 Unspecified glaucoma: Secondary | ICD-10-CM | POA: Diagnosis not present

## 2022-05-06 ENCOUNTER — Other Ambulatory Visit: Payer: Self-pay

## 2022-05-06 DIAGNOSIS — C9110 Chronic lymphocytic leukemia of B-cell type not having achieved remission: Secondary | ICD-10-CM

## 2022-05-06 NOTE — Progress Notes (Signed)
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 female here for review of labs and follow-up for CLL and chronic ITP. She reports She is doing well with no new symptoms or concerns.  ***  No fever, chills, night sweats. No new lumps, bumps, or lesions/rashes. No bleeding or abnormal bruising. No new fatigue. No new infection issues. No other new or acute focal symptoms.  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  SOCIAL HISTORY: Social History   Socioeconomic History   Marital status: Married    Spouse name: Not on file   Number of children: 1   Years of education: HS   Highest education level: Not on file  Occupational History   Occupation: Retired  Tobacco Use   Smoking status: Never   Smokeless tobacco: Never  Vaping Use   Vaping Use: Never used  Substance and Sexual Activity   Alcohol use: No    Alcohol/week: 0.0 standard drinks of alcohol   Drug use: No   Sexual activity: Yes    Birth control/protection: Post-menopausal  Other Topics Concern   Not on file  Social History Narrative   Drinks 2 cups of coffee   Social Determinants of Health   Financial Resource Strain: Not on file  Food Insecurity: Not on file  Transportation Needs: Not on file  Physical Activity: Not on file  Stress: Not on  file  Social Connections: Not on file  Intimate Partner Violence: Not on file    FAMILY HISTORY: Family History  Problem Relation Age of Onset   Stroke Mother    Lung cancer Father    Leukemia Neg Hx    Breast cancer Neg Hx    Ovarian cancer Neg Hx    Colon cancer Neg Hx    Endometrial cancer Neg Hx    Prostate cancer Neg Hx    Pancreatic cancer Neg Hx     ALLERGIES:  is allergic to codeine, evista [raloxifene], alendronate, ibandronic acid, and risedronate.  MEDICATIONS:  Current Outpatient Medications  Medication Sig Dispense Refill   amLODipine (NORVASC) 10 MG tablet Take 1 tablet (10 mg total) by mouth at bedtime. 30 tablet 0   atorvastatin (LIPITOR) 40 MG tablet Take 40 mg by mouth daily.      Brinzolamide-Brimonidine (SIMBRINZA) 1-0.2 % SUSP Place 1 drop into both eyes daily at 12 noon. (Patient not taking: Reported on 01/06/2021)     Calcium Carbonate-Vitamin D (CALCIUM-D PO) Take 600 mg by mouth daily.     Cholecalciferol (VITAMIN D3) 2000 units TABS Take 2,000 Units by mouth daily.      dorzolamide-timolol (COSOPT) 22.3-6.8 MG/ML ophthalmic solution Place 1 drop into both eyes daily.      eltrombopag (PROMACTA) 25 MG tablet Take 1 tablet (25 mg total) by mouth daily. Take on an empty stomach,  1 hour before a meal or 2 hours after. 30 tablet 11   furosemide (LASIX) 20 MG tablet Take 40 mg for 3 days then resume your 20 mg daily (Patient taking differently: Take 20 mg by mouth daily.) 30 tablet    ibandronate (BONIVA) 150 MG tablet Take 150 mg by mouth every morning.     latanoprost (XALATAN) 0.005 % ophthalmic solution Place 1 drop into both eyes at bedtime.      Multiple Vitamin (MULTIVITAMIN WITH MINERALS) TABS tablet Take 1 tablet by mouth daily.     quinapril (ACCUPRIL) 40 MG tablet Take 60 mg by mouth daily.     sertraline (ZOLOFT) 25 MG tablet Take 25 mg by mouth daily.     Current Facility-Administered Medications  Medication Dose Route Frequency Provider Last  Rate Last Admin   0.9 %  sodium chloride infusion   Intravenous PRN Kathyrn Lass, MD        REVIEW OF SYSTEMS:   10 Point review of Systems was done is negative except as noted above.  PHYSICAL EXAMINATION: Vitals***  NAD*** GENERAL:alert, in no acute distress and comfortable SKIN: no acute rashes, no significant lesions EYES: conjunctiva are pink and non-injected, sclera anicteric NECK: supple, no JVD LYMPH:  no palpable lymphadenopathy in the cervical, axillary or inguinal regions LUNGS: clear to auscultation b/l with normal respiratory effort HEART: regular rate & rhythm ABDOMEN:  normoactive bowel sounds , non tender, not distended. Extremity: no pedal edema PSYCH: alert & oriented x 3 with fluent speech NEURO: no focal motor/sensory deficits  LABORATORY DATA:  I have reviewed the data as listed  .    Latest Ref Rng & Units 02/04/2022   10:20 AM 11/06/2021   10:25 AM 07/31/2021    8:30 AM  CBC  WBC 4.0 - 10.5 K/uL 5.8  5.9  6.0   Hemoglobin 12.0 - 15.0 g/dL 14.4  15.4  14.5   Hematocrit 36.0 - 46.0 % 40.9  45.4  41.7   Platelets 150 - 400 K/uL 150  143  109     .    Latest Ref Rng & Units 02/04/2022   10:20 AM 11/06/2021   10:25 AM 05/29/2021   10:54 AM  CMP  Glucose 70 - 99 mg/dL 95  88  94   BUN 8 - 23 mg/dL '19  17  13   '$ Creatinine 0.44 - 1.00 mg/dL 0.89  0.83  0.85   Sodium 135 - 145 mmol/L 142  141  145   Potassium 3.5 - 5.1 mmol/L 4.0  4.4  4.4   Chloride 98 - 111 mmol/L 108  107  107   CO2 22 - 32 mmol/L '30  29  30   '$ Calcium 8.9 - 10.3 mg/dL 9.3  9.8  9.9   Total Protein 6.5 - 8.1 g/dL 6.6  7.0  6.7   Total Bilirubin 0.3 - 1.2 mg/dL 0.6  0.6  0.5   Alkaline Phos 38 - 126 U/L 55  76  81   AST 15 - 41 U/L '19  22  19   '$ ALT 0 - 44 U/L '10  10  11    '$ . Lab Results  Component Value Date   LDH 139 02/04/2022    02/27/2019 FISH:    RADIOGRAPHIC STUDIES: I have personally reviewed the radiological images as listed and agreed with the findings in the  report.  No results found.  ASSESSMENT & PLAN:   #1 CLL Previous CLL prognostic FISH  panel without detectable mutations. Has been Rai stage 0.. Now with new anemia and severe thrombocytopenia. WBC counts have increased significantly to 84k from 39k 3 months ago.  Concerning for change in tempo of her disease. -08/31/2019 CT C/A/P ([id number]) ([id number]) revealed "1. Mild multistation lymphadenopathy in the retroperitoneum and bilateral pelvis as detailed, compatible with lymphoma. Normal size spleen. No thoracic adenopathy."  #2 s/p severe thrombocytopenia with platelet counts around 5k likely related to CLL.  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  However cannot rule out bone marrow involvement as an etiology of thrombocytopenia as well.  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  Currently no anemia.  #4 h/o intraparenchymal bleed- rpt CT head stable (due to uncontrolled HTN and thrombocytopenia)  PLAN: -Discussed lab results from today CBC shows hemoglobin of *** with normal WBC count of ***k and normal platelets of ***k CMP within normal limits LDH within normal limits at *** Patient has no clinical or lab evidence of CLL progression at this time. 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 Patient has no notable toxicities from her current dose of Promacta. Continue Promacta 25 mg p.o. daily  FOLLOW UP: Return to clinic with Dr. Irene Limbo with labs in 3 months  The total time spent in the appointment was *** minutes*.  All of the patient's questions were answered with apparent satisfaction. The patient knows to call the clinic with any problems, questions or concerns.   Sullivan Lone MD MS AAHIVMS [facility] Hematology/Oncology Physician [facility]  .*Total Encounter Time as defined by the Centers for Medicare and Medicaid Services includes, in addition to the face-to-face time  of a patient visit (documented in the note above) non-face-to-face time: obtaining and reviewing outside history, ordering and reviewing medications, tests or procedures, care coordination (communications with other health care professionals or caregivers) and documentation in the medical record.  I, Melene Muller, am acting as scribe for Dr. Sullivan Lone, MD.

## 2022-05-07 ENCOUNTER — Inpatient Hospital Stay: Payer: PPO | Attending: Hematology | Admitting: Hematology

## 2022-05-07 ENCOUNTER — Other Ambulatory Visit: Payer: Self-pay

## 2022-05-07 ENCOUNTER — Inpatient Hospital Stay: Payer: PPO

## 2022-05-07 VITALS — BP 147/59 | HR 59 | Temp 98.1°F | Resp 15 | Wt 106.5 lb

## 2022-05-07 DIAGNOSIS — D693 Immune thrombocytopenic purpura: Secondary | ICD-10-CM | POA: Insufficient documentation

## 2022-05-07 DIAGNOSIS — C9110 Chronic lymphocytic leukemia of B-cell type not having achieved remission: Secondary | ICD-10-CM | POA: Diagnosis not present

## 2022-05-07 LAB — CMP (CANCER CENTER ONLY)
ALT: 10 U/L (ref 0–44)
AST: 19 U/L (ref 15–41)
Albumin: 4.5 g/dL (ref 3.5–5.0)
Alkaline Phosphatase: 55 U/L (ref 38–126)
Anion gap: 6 (ref 5–15)
BUN: 15 mg/dL (ref 8–23)
CO2: 29 mmol/L (ref 22–32)
Calcium: 9.7 mg/dL (ref 8.9–10.3)
Chloride: 107 mmol/L (ref 98–111)
Creatinine: 0.81 mg/dL (ref 0.44–1.00)
GFR, Estimated: >60 mL/min (ref >60)
Glucose, Bld: 93 mg/dL (ref 70–99)
Potassium: 4 mmol/L (ref 3.5–5.1)
Sodium: 142 mmol/L (ref 135–145)
Total Bilirubin: 0.7 mg/dL (ref 0.3–1.2)
Total Protein: 6.9 g/dL (ref 6.5–8.1)

## 2022-05-07 LAB — CBC WITH DIFFERENTIAL (CANCER CENTER ONLY)
Abs Immature Granulocytes: 0.01 10*3/uL (ref 0.00–0.07)
Basophils Absolute: 0 10*3/uL (ref 0.0–0.1)
Basophils Relative: 0 %
Eosinophils Absolute: 0.3 10*3/uL (ref 0.0–0.5)
Eosinophils Relative: 5 %
HCT: 42.2 % (ref 36.0–46.0)
Hemoglobin: 14.9 g/dL (ref 12.0–15.0)
Immature Granulocytes: 0 %
Lymphocytes Relative: 43 %
Lymphs Abs: 3 10*3/uL (ref 0.7–4.0)
MCH: 32.7 pg (ref 26.0–34.0)
MCHC: 35.3 g/dL (ref 30.0–36.0)
MCV: 92.5 fL (ref 80.0–100.0)
Monocytes Absolute: 0.6 10*3/uL (ref 0.1–1.0)
Monocytes Relative: 9 %
Neutro Abs: 3 10*3/uL (ref 1.7–7.7)
Neutrophils Relative %: 43 %
Platelet Count: 171 10*3/uL (ref 150–400)
RBC: 4.56 MIL/uL (ref 3.87–5.11)
RDW: 12.9 % (ref 11.5–15.5)
WBC Count: 6.9 10*3/uL (ref 4.0–10.5)
nRBC: 0 % (ref 0.0–0.2)

## 2022-05-07 LAB — LACTATE DEHYDROGENASE: LDH: 151 U/L (ref 98–192)

## 2022-05-18 ENCOUNTER — Telehealth: Payer: Self-pay | Admitting: *Deleted

## 2022-05-18 NOTE — Telephone Encounter (Signed)
Called and left the patient a message to call the office back. Patient needs to be scheduled for a phone visit following her [place] scan on 9/25

## 2022-05-18 NOTE — Telephone Encounter (Signed)
Patient returned call and scheduled a phone visit for 9/26 at 4 pm

## 2022-06-07 ENCOUNTER — Ambulatory Visit (HOSPITAL_COMMUNITY)
Admission: RE | Admit: 2022-06-07 | Discharge: 2022-06-07 | Disposition: A | Discharge status: Home / Self Care | Payer: PPO | Source: Ambulatory Visit | Attending: Gynecologic Oncology | Admitting: Gynecologic Oncology

## 2022-06-07 DIAGNOSIS — N9489 Other specified conditions associated with female genital organs and menstrual cycle: Secondary | ICD-10-CM | POA: Insufficient documentation

## 2022-06-07 DIAGNOSIS — Z780 Asymptomatic menopausal state: Secondary | ICD-10-CM | POA: Diagnosis not present

## 2022-06-07 DIAGNOSIS — N83202 Unspecified ovarian cyst, left side: Secondary | ICD-10-CM | POA: Diagnosis not present

## 2022-06-08 ENCOUNTER — Encounter: Payer: Self-pay | Admitting: Gynecologic Oncology

## 2022-06-08 ENCOUNTER — Inpatient Hospital Stay: Payer: PPO | Attending: Hematology | Admitting: Gynecologic Oncology

## 2022-06-08 DIAGNOSIS — C9110 Chronic lymphocytic leukemia of B-cell type not having achieved remission: Secondary | ICD-10-CM | POA: Diagnosis not present

## 2022-06-08 DIAGNOSIS — N9489 Other specified conditions associated with female genital organs and menstrual cycle: Secondary | ICD-10-CM

## 2022-06-08 NOTE — Progress Notes (Signed)
Gynecologic Oncology Telehealth Note: Gyn-Onc  I connected with Selena Branch on 06/08/22 at  4:00 PM EDT by telephone and verified that I am speaking with the correct person using two identifiers.  I discussed the limitations, risks, security and privacy concerns of performing an evaluation and management service by telemedicine and the availability of in-person appointments. I also discussed with the patient that there may be a patient responsible charge related to this service. The patient expressed understanding and agreed to proceed.  Other persons participating in the visit and their role in the encounter: none.  Patient's location: home Provider's location: [facility]  Reason for Visit: follow-up imaging  Treatment History: Oncology History  CLL (chronic lymphocytic leukemia) ([place])  03/07/2019 Initial Diagnosis   CLL (chronic lymphocytic leukemia) ([place])   09/19/2019 - 10/12/2019 Chemotherapy   Patient is on Treatment Plan : NON-HODGKINS LYMPHOMA Rituximab q7d      Patient's history is notable for chronic lymphocytic leukemia with treatment outlined below.  At the end of 2020, she had an adnexal mass noted on her CT scan.  There had been minimal increase in size of this mass on repeat CT scan in March of this year.   01/01/2021: Pelvic ultrasound exam shows the right ovary measures 6.1 x 4.4 x 6.1 cm with a simple appearing adnexal cyst measuring up to 5.7 cm.  06/03/2021: Pelvic ultrasound exam reveals right ovary measures 6.1 x 4.6 x 5.9 cm.  Large simple appearing cyst measures up to 5.7 cm.  No septations or mural nodularity.  12/02/2021: Pelvic ultrasound exam reveals simple appearing cystic structure within the right adnexa now measuring 6.1 x 5.7 x 4.1 cm.  Some internal echogenicity.  06/07/2022: Pelvic ultrasound exam shows large simple appearing cyst within the left pelvis measures 6.7 x 4.8 x 6.1 cm.  No septations, mural nodularity, excrescences, or other complicating  uterus.  Interval History: Patient reports doing well.  She denies any abdominal or pelvic pain.  Reports baseline bowel bladder function.  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  Family History  Problem Relation Age of Onset   Stroke Mother    Lung cancer Father    Leukemia Neg Hx    Breast cancer Neg Hx    Ovarian cancer Neg Hx    Colon cancer Neg Hx    Endometrial cancer Neg Hx    Prostate cancer Neg Hx    Pancreatic cancer Neg Hx     Social History   Socioeconomic History   Marital status: Married    Spouse name: Not on file   Number of children: 1   Years of education: HS   Highest education level: Not on file  Occupational History   Occupation: Retired  Tobacco Use   Smoking status: Never   Smokeless tobacco: Never  Vaping Use   Vaping Use: Never used  Substance and Sexual Activity   Alcohol use: No    Alcohol/week: 0.0 standard drinks of alcohol   Drug use: No   Sexual activity: Yes    Birth control/protection: Post-menopausal  Other Topics Concern   Not on file  Social History Narrative   Drinks 2 cups of coffee   Social Determinants of Health   Financial Resource Strain: Not on file  Food  Insecurity: Not on file  Transportation Needs: Not on file  Physical Activity: Not on file  Stress: Not on file  Social Connections: Not on file    Current Medications:  Current Outpatient Medications:    amLODipine (NORVASC) 10 MG tablet, Take 1 tablet (10 mg total) by mouth at bedtime., Disp: 30 tablet, Rfl: 0   atorvastatin (LIPITOR) 40 MG tablet, Take 40 mg by mouth daily. , Disp: , Rfl:    Brinzolamide-Brimonidine (SIMBRINZA) 1-0.2 % SUSP, Place 1 drop into both eyes daily at 12 noon. (Patient not taking: Reported on 01/06/2021), Disp: , Rfl:    Calcium  Carbonate-Vitamin D (CALCIUM-D PO), Take 600 mg by mouth daily., Disp: , Rfl:    Cholecalciferol (VITAMIN D3) 2000 units TABS, Take 2,000 Units by mouth daily. , Disp: , Rfl:    dorzolamide-timolol (COSOPT) 22.3-6.8 MG/ML ophthalmic solution, Place 1 drop into both eyes daily. , Disp: , Rfl:    eltrombopag (PROMACTA) 25 MG tablet, Take 1 tablet (25 mg total) by mouth daily. Take on an empty stomach, 1 hour before a meal or 2 hours after., Disp: 30 tablet, Rfl: 11   furosemide (LASIX) 20 MG tablet, Take 40 mg for 3 days then resume your 20 mg daily (Patient taking differently: Take 20 mg by mouth daily.), Disp: 30 tablet, Rfl:    ibandronate (BONIVA) 150 MG tablet, Take 150 mg by mouth every morning., Disp: , Rfl:    latanoprost (XALATAN) 0.005 % ophthalmic solution, Place 1 drop into both eyes at bedtime. , Disp: , Rfl:    Multiple Vitamin (MULTIVITAMIN WITH MINERALS) TABS tablet, Take 1 tablet by mouth daily., Disp: , Rfl:    quinapril (ACCUPRIL) 40 MG tablet, Take 60 mg by mouth daily., Disp: , Rfl:    sertraline (ZOLOFT) 25 MG tablet, Take 25 mg by mouth daily., Disp: , Rfl:   Current Facility-Administered Medications:    0.9 %  sodium chloride infusion, , Intravenous, PRN, Kathyrn Lass, MD  Review of Symptoms: Pertinent positives as per HPI.  Physical Exam: Deferred given limitations of phone visit.  Laboratory & Radiologic Studies: See recent ultrasound above  Assessment & Plan: Selena Branch is a 81 y.o. woman with CLL incidentally found to have a simple adnexal mass that has grown slowly with imaging.  Patient remains asymptomatic.  Talked to her about slowly increasing size of her adnexal mass.  There continues not to be any complex features of the cyst.  We discussed that if the mass continues to grow, I suspect that she may start having some symptoms.  I think it would be reasonable to consider surgical intervention at this time or wait until the cyst is bigger and/or causing  symptoms.  Her preference is to wait.  I suggested that her next imaging study could be an MRI instead of a pelvic ultrasound to give [place] a little bit better detail about this adnexa.  We will plan to schedule an MRI for the beginning of the year.  Reviewed signs and symptoms that should prompt a phone call from the patient prior to our next discussion about her follow-up imaging.  I discussed the assessment and treatment plan with the patient. The patient was provided with an opportunity to ask questions and all were answered. The patient agreed with the plan and demonstrated an understanding of the instructions.   The patient was advised to call back or see an in-person evaluation if the symptoms worsen or if the condition fails to  improve as anticipated.   8 minutes of total time was spent for this patient encounter, including preparation, phone counseling with the patient and coordination of care, and documentation of the encounter.   Jeral Pinch, MD  Division of Gynecologic Oncology  Department of Obstetrics and Gynecology  [facility]

## 2022-06-09 ENCOUNTER — Telehealth: Payer: Self-pay | Admitting: *Deleted

## 2022-06-09 NOTE — Telephone Encounter (Signed)
Per Dr Berline Lopes, scheduled the patient for a MRI on 09/15/22 at 12 pm. Patient given appt date/time

## 2022-07-14 ENCOUNTER — Other Ambulatory Visit (HOSPITAL_COMMUNITY): Payer: Self-pay

## 2022-07-14 ENCOUNTER — Telehealth: Payer: Self-pay | Admitting: Pharmacy Technician

## 2022-07-14 DIAGNOSIS — H401131 Primary open-angle glaucoma, bilateral, mild stage: Secondary | ICD-10-CM | POA: Diagnosis not present

## 2022-07-14 DIAGNOSIS — H5213 Myopia, bilateral: Secondary | ICD-10-CM | POA: Diagnosis not present

## 2022-07-14 DIAGNOSIS — H52221 Regular astigmatism, right eye: Secondary | ICD-10-CM | POA: Diagnosis not present

## 2022-07-14 DIAGNOSIS — H524 Presbyopia: Secondary | ICD-10-CM | POA: Diagnosis not present

## 2022-07-14 NOTE — Telephone Encounter (Signed)
Oral Oncology Patient Advocate Encounter   Received notification that patient is due for re-enrollment for assistance for Promacta through [employer].   Re-enrollment process has been initiated and will be submitted upon completion of necessary documents.  [employer] phone number [phone].   I will continue to follow until final determination.  Lady Deutscher, CPhT-Adv Oncology Pharmacy Patient [place] Direct Number: [phone]  Fax: [phone]

## 2022-07-29 ENCOUNTER — Other Ambulatory Visit: Payer: Self-pay

## 2022-07-29 DIAGNOSIS — C9110 Chronic lymphocytic leukemia of B-cell type not having achieved remission: Secondary | ICD-10-CM

## 2022-07-30 ENCOUNTER — Inpatient Hospital Stay (HOSPITAL_BASED_OUTPATIENT_CLINIC_OR_DEPARTMENT_OTHER): Payer: PPO | Admitting: Hematology

## 2022-07-30 ENCOUNTER — Inpatient Hospital Stay: Payer: PPO | Attending: Hematology

## 2022-07-30 ENCOUNTER — Other Ambulatory Visit (HOSPITAL_COMMUNITY): Payer: Self-pay

## 2022-07-30 VITALS — BP 157/64 | HR 58 | Temp 98.2°F | Resp 16 | Wt 107.5 lb

## 2022-07-30 DIAGNOSIS — Z79899 Other long term (current) drug therapy: Secondary | ICD-10-CM | POA: Insufficient documentation

## 2022-07-30 DIAGNOSIS — I10 Essential (primary) hypertension: Secondary | ICD-10-CM | POA: Insufficient documentation

## 2022-07-30 DIAGNOSIS — C9110 Chronic lymphocytic leukemia of B-cell type not having achieved remission: Secondary | ICD-10-CM | POA: Diagnosis not present

## 2022-07-30 DIAGNOSIS — D693 Immune thrombocytopenic purpura: Secondary | ICD-10-CM | POA: Diagnosis not present

## 2022-07-30 LAB — CBC WITH DIFFERENTIAL (CANCER CENTER ONLY)
Abs Immature Granulocytes: 0 10*3/uL (ref 0.00–0.07)
Basophils Absolute: 0 10*3/uL (ref 0.0–0.1)
Basophils Relative: 0 %
Eosinophils Absolute: 0.2 10*3/uL (ref 0.0–0.5)
Eosinophils Relative: 3 %
HCT: 42.8 % (ref 36.0–46.0)
Hemoglobin: 14.7 g/dL (ref 12.0–15.0)
Immature Granulocytes: 0 %
Lymphocytes Relative: 49 %
Lymphs Abs: 3.2 10*3/uL (ref 0.7–4.0)
MCH: 32.6 pg (ref 26.0–34.0)
MCHC: 34.3 g/dL (ref 30.0–36.0)
MCV: 94.9 fL (ref 80.0–100.0)
Monocytes Absolute: 0.6 10*3/uL (ref 0.1–1.0)
Monocytes Relative: 9 %
Neutro Abs: 2.6 10*3/uL (ref 1.7–7.7)
Neutrophils Relative %: 39 %
Platelet Count: 200 10*3/uL (ref 150–400)
RBC: 4.51 MIL/uL (ref 3.87–5.11)
RDW: 12.6 % (ref 11.5–15.5)
WBC Count: 6.7 10*3/uL (ref 4.0–10.5)
nRBC: 0 % (ref 0.0–0.2)

## 2022-07-30 LAB — CMP (CANCER CENTER ONLY)
ALT: 12 U/L (ref 0–44)
AST: 23 U/L (ref 15–41)
Albumin: 4.7 g/dL (ref 3.5–5.0)
Alkaline Phosphatase: 53 U/L (ref 38–126)
Anion gap: 4 — ABNORMAL LOW (ref 5–15)
BUN: 27 mg/dL — ABNORMAL HIGH (ref 8–23)
CO2: 33 mmol/L — ABNORMAL HIGH (ref 22–32)
Calcium: 10.2 mg/dL (ref 8.9–10.3)
Chloride: 106 mmol/L (ref 98–111)
Creatinine: 0.93 mg/dL (ref 0.44–1.00)
GFR, Estimated: >60 mL/min (ref >60)
Glucose, Bld: 81 mg/dL (ref 70–99)
Potassium: 4.8 mmol/L (ref 3.5–5.1)
Sodium: 143 mmol/L (ref 135–145)
Total Bilirubin: 0.7 mg/dL (ref 0.3–1.2)
Total Protein: 7.3 g/dL (ref 6.5–8.1)

## 2022-07-30 LAB — LACTATE DEHYDROGENASE: LDH: 156 U/L (ref 98–192)

## 2022-07-30 MED ORDER — LISINOPRIL 40 MG PO TABS: 40.0000 mg | ORAL_TABLET | Freq: Every day | ORAL | Status: DC

## 2022-07-30 NOTE — Telephone Encounter (Signed)
Oral Oncology Patient Advocate Encounter   Met with patient in lobby to collect paperwork requested by [employer].  Completed application submitted via fax to [phone] on 07/30/22.    [employer] phone number [phone].   I will continue to check the status until final determination.   Lady Deutscher, CPhT-Adv Oncology Pharmacy Patient [place] Direct Number: [phone]  Fax: [phone]

## 2022-08-02 ENCOUNTER — Other Ambulatory Visit: Payer: Self-pay

## 2022-08-02 DIAGNOSIS — C9110 Chronic lymphocytic leukemia of B-cell type not having achieved remission: Secondary | ICD-10-CM

## 2022-08-02 DIAGNOSIS — H401121 Primary open-angle glaucoma, left eye, mild stage: Secondary | ICD-10-CM | POA: Diagnosis not present

## 2022-08-02 MED ORDER — ELTROMBOPAG OLAMINE 25 MG PO TABS: 25.0000 mg | ORAL_TABLET | Freq: Every day | ORAL | 11 refills | Status: DC

## 2022-08-04 NOTE — Telephone Encounter (Signed)
Oral Oncology Patient Advocate Encounter   Received notification re-enrollment for assistance for Promacta through [employer] has been approved. Patient may continue to receive their medication at $0 from this program.    [employer] phone number [phone].   Effective dates: 09/13/22 through 09/13/23  I have spoken to the patient.  Donaldson Richter, CPhT-Adv Oncology Pharmacy Patient Advocate  [facility] Direct Number: [phone]  Fax: [phone]   

## 2022-08-05 NOTE — Progress Notes (Signed)
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 female is here for 43-monthfollow-up for continued evaluation and management of her CLL and chronic ITP.  She notes no acute new symptoms since her last clinic visit. No fevers no chills no night sweats no unexpected weight loss. No new lumps or bumps. Blood pressure well-controlled. Tolerating her Promacta well and has been taking this regularly. Labs done today were discussed in detail with her.  MEDICAL HISTORY:  Past Medical History:  Diagnosis Date   Anxiety    CLL (chronic lymphocytic leukemia) ([facility] 11/2018   Glaucoma    Hyperlipemia    Hypertension    Osteoporosis     SURGICAL HISTORY: No past surgical history on file.  SOCIAL HISTORY: Social History   Socioeconomic History   Marital status: Married    Spouse name: Not on file   Number of children: 1   Years of education: HS   Highest education level: Not on file  Occupational History   Occupation: Retired  Tobacco Use   Smoking status: Never   Smokeless tobacco: Never  Vaping Use   Vaping Use: Never used  Substance and Sexual Activity   Alcohol use: No    Alcohol/week: 0.0 standard drinks of alcohol   Drug use: No   Sexual activity: Yes    Birth control/protection: Post-menopausal  Other Topics Concern   Not on file  Social History Narrative   Drinks 2 cups of coffee   Social Determinants of Health   Financial Resource Strain: Not on file  Food Insecurity: Not on file  Transportation Needs: Not on file  Physical Activity: Not on  file  Stress: Not on file  Social Connections: Not on file  Intimate Partner Violence: Not on file    FAMILY HISTORY: Family History  Problem Relation Age of Onset   Stroke Mother    Lung cancer Father    Leukemia Neg Hx    Breast cancer Neg Hx    Ovarian cancer Neg Hx    Colon cancer Neg Hx    Endometrial cancer Neg Hx    Prostate cancer Neg Hx    Pancreatic cancer Neg Hx     ALLERGIES:  is allergic to codeine, evista [raloxifene], alendronate, ibandronic acid, and risedronate.  MEDICATIONS:  Current Outpatient Medications  Medication Sig Dispense Refill   lisinopril (ZESTRIL) 40 MG tablet Take 1 tablet (40 mg total) by mouth daily.     amLODipine (NORVASC) 10 MG tablet Take 1 tablet (10 mg total) by mouth at bedtime. 30 tablet 0   atorvastatin (LIPITOR) 40 MG tablet Take 40 mg by mouth daily.      Brinzolamide-Brimonidine (SIMBRINZA) 1-0.2 % SUSP Place 1 drop into both eyes daily at 12 noon. (Patient not taking: Reported on 01/06/2021)     Calcium Carbonate-Vitamin D (CALCIUM-D PO) Take 600 mg by mouth daily.     Cholecalciferol (VITAMIN D3) 2000 units TABS Take 2,000 Units by mouth daily.      dorzolamide-timolol (COSOPT) 22.3-6.8 MG/ML ophthalmic solution Place 1 drop into both eyes daily.      eltrombopag (  PROMACTA) 25 MG tablet Take 1 tablet (25 mg total) by mouth daily. Take on an empty stomach, 1 hour before a meal or 2 hours after. 30 tablet 11   furosemide (LASIX) 20 MG tablet Take 40 mg for 3 days then resume your 20 mg daily (Patient taking differently: Take 20 mg by mouth daily.) 30 tablet    ibandronate (BONIVA) 150 MG tablet Take 150 mg by mouth every morning.     latanoprost (XALATAN) 0.005 % ophthalmic solution Place 1 drop into both eyes at bedtime.      Multiple Vitamin (MULTIVITAMIN WITH MINERALS) TABS tablet Take 1 tablet by mouth daily.     sertraline (ZOLOFT) 25 MG tablet Take 25 mg by mouth daily.     Current Facility-Administered Medications  Medication  Dose Route Frequency Provider Last Rate Last Admin   0.9 %  sodium chloride infusion   Intravenous PRN Kathyrn Lass, MD        REVIEW OF SYSTEMS:   10 Point review of Systems was done is negative except as noted above.  PHYSICAL EXAMINATION: .BP (!) 157/64 (BP Location: Left Arm, Patient Position: Sitting) Comment: nurse notified  Pulse (!) 58   Temp 98.2 F (36.8 C) (Oral)   Resp 16   Wt 107 lb 8 oz (48.8 kg)   SpO2 98%   BMI 22.47 kg/m  NAD GENERAL:alert, in no acute distress and comfortable SKIN: no acute rashes, no significant lesions EYES: conjunctiva are pink and non-injected, sclera anicteric OROPHARYNX: MMM, no exudates, no oropharyngeal erythema or ulceration NECK: supple, no JVD LYMPH:  no palpable lymphadenopathy in the cervical, axillary or inguinal regions LUNGS: clear to auscultation b/l with normal respiratory effort HEART: regular rate & rhythm ABDOMEN:  normoactive bowel sounds , non tender, not distended. Extremity: no pedal edema PSYCH: alert & oriented x 3 with fluent speech NEURO: no focal motor/sensory deficits   LABORATORY DATA:  I have reviewed the data as listed  .    Latest Ref Rng & Units 07/30/2022    8:56 AM 05/07/2022    9:30 AM 02/04/2022   10:20 AM  CBC  WBC 4.0 - 10.5 K/uL 6.7  6.9  5.8   Hemoglobin 12.0 - 15.0 g/dL 14.7  14.9  14.4   Hematocrit 36.0 - 46.0 % 42.8  42.2  40.9   Platelets 150 - 400 K/uL 200  171  150     .    Latest Ref Rng & Units 07/30/2022    8:56 AM 05/07/2022    9:30 AM 02/04/2022   10:20 AM  CMP  Glucose 70 - 99 mg/dL 81  93  95   BUN 8 - 23 mg/dL '27  15  19   '$ Creatinine 0.44 - 1.00 mg/dL 0.93  0.81  0.89   Sodium 135 - 145 mmol/L 143  142  142   Potassium 3.5 - 5.1 mmol/L 4.8  4.0  4.0   Chloride 98 - 111 mmol/L 106  107  108   CO2 22 - 32 mmol/L 33  29  30   Calcium 8.9 - 10.3 mg/dL 10.2  9.7  9.3   Total Protein 6.5 - 8.1 g/dL 7.3  6.9  6.6   Total Bilirubin 0.3 - 1.2 mg/dL 0.7  0.7  0.6   Alkaline  Phos 38 - 126 U/L 53  55  55   AST 15 - 41 U/L '23  19  19   '$ ALT 0 - 44 U/L 12  10  10    . Lab Results  Component Value Date   LDH 156 07/30/2022    02/27/2019 FISH:    RADIOGRAPHIC STUDIES: I have personally reviewed the radiological images as listed and agreed with the findings in the report.  No results found.  ASSESSMENT & PLAN:   #1 CLL-currently in remission Previous CLL prognostic FISH panel without detectable mutations. Has been Rai stage 0. 08/31/2019 CT C/A/P ([id number]) ([id number]) revealed "1. Mild multistation lymphadenopathy in the retroperitoneum and bilateral pelvis as detailed, compatible with lymphoma. Normal size spleen. No thoracic adenopathy."  #2 s/p severe thrombocytopenia with platelet counts around 5k likely related to CLL.  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  Currently no anemia.  #4 h/o intraparenchymal bleed- rpt CT head stable (due to uncontrolled HTN and thrombocytopenia)  PLAN: -Discussed lab results from today in detail with the patient  CBC stable with normal platelet counts of 200k normal hemoglobin and WBC count. CMP stable LDH within normal limits at 156 Patient has no lab or clinical evidence of CLL progression at this time. Patient has no notable toxicities from her current dose of Promacta. Continue Promacta 25 mg p.o. daily. If platelets continue to remain stable upon next follow-up in 3 months we will consider starting to wean down Promacta dose.  FOLLOW UP: Return to clinic with Dr. Irene Limbo with labs in 3 months  The total time spent in the appointment was 20 minutes*.  All of the patient's questions were answered with apparent satisfaction. The patient knows to call the clinic with any problems, questions or concerns.   Sullivan Lone MD MS AAHIVMS [facility] Hematology/Oncology Physician [facility]  .*Total  Encounter Time as defined by the Centers for Medicare and Medicaid Services includes, in addition to the face-to-face time of a patient visit (documented in the note above) non-face-to-face time: obtaining and reviewing outside history, ordering and reviewing medications, tests or procedures, care coordination (communications with other health care professionals or caregivers) and documentation in the medical record.

## 2022-08-19 ENCOUNTER — Other Ambulatory Visit: Payer: Self-pay

## 2022-08-19 DIAGNOSIS — C9110 Chronic lymphocytic leukemia of B-cell type not having achieved remission: Secondary | ICD-10-CM

## 2022-08-19 MED ORDER — ELTROMBOPAG OLAMINE 25 MG PO TABS: 25.0000 mg | ORAL_TABLET | Freq: Every day | ORAL | 11 refills | Status: DC

## 2022-09-15 ENCOUNTER — Ambulatory Visit (HOSPITAL_COMMUNITY)
Admission: RE | Admit: 2022-09-15 | Discharge: 2022-09-15 | Disposition: A | Discharge status: Home / Self Care | Payer: PPO | Source: Ambulatory Visit | Attending: Gynecologic Oncology | Admitting: Gynecologic Oncology

## 2022-09-15 DIAGNOSIS — N9489 Other specified conditions associated with female genital organs and menstrual cycle: Secondary | ICD-10-CM | POA: Diagnosis not present

## 2022-09-15 DIAGNOSIS — Z780 Asymptomatic menopausal state: Secondary | ICD-10-CM | POA: Diagnosis not present

## 2022-09-15 DIAGNOSIS — N83202 Unspecified ovarian cyst, left side: Secondary | ICD-10-CM | POA: Diagnosis not present

## 2022-09-15 DIAGNOSIS — N888 Other specified noninflammatory disorders of cervix uteri: Secondary | ICD-10-CM | POA: Diagnosis not present

## 2022-09-15 DIAGNOSIS — K5730 Diverticulosis of large intestine without perforation or abscess without bleeding: Secondary | ICD-10-CM | POA: Diagnosis not present

## 2022-09-15 MED ORDER — GADOBUTROL 1 MMOL/ML IV SOLN
5.0000 mL | Freq: Once | INTRAVENOUS | Status: AC | PRN
  Administered 2022-09-15: 5 mL via INTRAVENOUS

## 2022-09-21 ENCOUNTER — Telehealth: Payer: Self-pay | Admitting: Gynecologic Oncology

## 2022-09-21 DIAGNOSIS — N9489 Other specified conditions associated with female genital organs and menstrual cycle: Secondary | ICD-10-CM

## 2022-09-21 NOTE — Telephone Encounter (Signed)
Called patient to discuss recent MRI. Reviewed results together. There has been minimal grown (1 cm over 3 years) of this cystic lesion - no imaging findings that significantly increase the suspicion for a malignant process. She continues to remain asymptomatic and wishes to avoid surgery if possible. Given very slow growth and general benign appearance of the mass, I think this is very reasonable. I have suggested we get a follow-up ultrasound in 6 months. Patient is amenable. My office will be asked to schedule this in July.  Jeral Pinch MD Gynecologic Oncology

## 2022-09-24 ENCOUNTER — Telehealth: Payer: Self-pay | Admitting: *Deleted

## 2022-09-24 NOTE — Telephone Encounter (Signed)
Per Dr Berline Lopes scheduled a [place] scan on 7/1. Patient aware of the date/time

## 2022-10-29 ENCOUNTER — Other Ambulatory Visit: Payer: Self-pay

## 2022-10-29 ENCOUNTER — Inpatient Hospital Stay: Payer: PPO | Attending: Hematology

## 2022-10-29 ENCOUNTER — Inpatient Hospital Stay (HOSPITAL_BASED_OUTPATIENT_CLINIC_OR_DEPARTMENT_OTHER): Payer: PPO | Admitting: Hematology

## 2022-10-29 VITALS — BP 144/65 | HR 74 | Temp 97.9°F | Resp 18 | Wt 106.4 lb

## 2022-10-29 DIAGNOSIS — D693 Immune thrombocytopenic purpura: Secondary | ICD-10-CM

## 2022-10-29 DIAGNOSIS — C9110 Chronic lymphocytic leukemia of B-cell type not having achieved remission: Secondary | ICD-10-CM | POA: Insufficient documentation

## 2022-10-29 DIAGNOSIS — Z79899 Other long term (current) drug therapy: Secondary | ICD-10-CM | POA: Diagnosis not present

## 2022-10-29 DIAGNOSIS — I10 Essential (primary) hypertension: Secondary | ICD-10-CM | POA: Diagnosis not present

## 2022-10-29 LAB — CBC WITH DIFFERENTIAL (CANCER CENTER ONLY)
Abs Immature Granulocytes: 0.01 10*3/uL (ref 0.00–0.07)
Basophils Absolute: 0 10*3/uL (ref 0.0–0.1)
Basophils Relative: 0 %
Eosinophils Absolute: 0.3 10*3/uL (ref 0.0–0.5)
Eosinophils Relative: 4 %
HCT: 44.5 % (ref 36.0–46.0)
Hemoglobin: 15.2 g/dL — ABNORMAL HIGH (ref 12.0–15.0)
Immature Granulocytes: 0 %
Lymphocytes Relative: 51 %
Lymphs Abs: 3.2 10*3/uL (ref 0.7–4.0)
MCH: 31.7 pg (ref 26.0–34.0)
MCHC: 34.2 g/dL (ref 30.0–36.0)
MCV: 92.9 fL (ref 80.0–100.0)
Monocytes Absolute: 0.5 10*3/uL (ref 0.1–1.0)
Monocytes Relative: 9 %
Neutro Abs: 2.2 10*3/uL (ref 1.7–7.7)
Neutrophils Relative %: 36 %
Platelet Count: 151 10*3/uL (ref 150–400)
RBC: 4.79 MIL/uL (ref 3.87–5.11)
RDW: 12.7 % (ref 11.5–15.5)
WBC Count: 6.2 10*3/uL (ref 4.0–10.5)
nRBC: 0 % (ref 0.0–0.2)

## 2022-10-29 LAB — LACTATE DEHYDROGENASE: LDH: 157 U/L (ref 98–192)

## 2022-10-29 LAB — CMP (CANCER CENTER ONLY)
ALT: 11 U/L (ref 0–44)
AST: 22 U/L (ref 15–41)
Albumin: 4.6 g/dL (ref 3.5–5.0)
Alkaline Phosphatase: 61 U/L (ref 38–126)
Anion gap: 9 (ref 5–15)
BUN: 20 mg/dL (ref 8–23)
CO2: 26 mmol/L (ref 22–32)
Calcium: 9.1 mg/dL (ref 8.9–10.3)
Chloride: 108 mmol/L (ref 98–111)
Creatinine: 0.88 mg/dL (ref 0.44–1.00)
GFR, Estimated: >60 mL/min (ref >60)
Glucose, Bld: 93 mg/dL (ref 70–99)
Potassium: 4.4 mmol/L (ref 3.5–5.1)
Sodium: 143 mmol/L (ref 135–145)
Total Bilirubin: 0.7 mg/dL (ref 0.3–1.2)
Total Protein: 7 g/dL (ref 6.5–8.1)

## 2022-10-29 NOTE — Progress Notes (Signed)
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    Patient was last seen by me on 07/30/22 and was doing well overall with no new medical concerns.   Today, she is accompanied by her daughter and reports feeling well overall. She is tolerating her Promacta well with no toxicities. She denies any new lumps/bumps, unexplained fevers, chills, loss of appetite, weight loss, abnormal bleeding or bruising issues.   She consistently stays active through activities such as shopping with her daughter and playing with her grandchildren.  She is not taking any aspirin or other blood thinners at this time. She does not take any OTC pain medications that affect platelet functions. She is UTD with her influenza and COVID-19 booster vaccinations. However, she has not taken her RSV vaccination at this time.     MEDICAL HISTORY:  Past Medical History:  Diagnosis Date   Anxiety    CLL (chronic lymphocytic leukemia) ([facility] 11/2018   Glaucoma    Hyperlipemia    Hypertension    Osteoporosis     SURGICAL HISTORY: No past surgical history on file.  SOCIAL HISTORY: Social History   Socioeconomic History   Marital status: Married    Spouse name: Not on file   Number of children: 1   Years of education: HS   Highest education level: Not on file  Occupational History   Occupation: Retired  Tobacco Use   Smoking status: Never   Smokeless tobacco: Never  Vaping Use   Vaping Use:  Never used  Substance and Sexual Activity   Alcohol use: No    Alcohol/week: 0.0 standard drinks of alcohol   Drug use: No   Sexual activity: Yes    Birth control/protection: Post-menopausal  Other Topics Concern   Not on file  Social History Narrative   Drinks 2 cups of coffee   Social Determinants of Health   Financial Resource Strain: Not on file  Food Insecurity: Not on file  Transportation Needs: Not on file  Physical Activity: Not on file  Stress: Not on file  Social Connections: Not on file  Intimate Partner Violence: Not on file    FAMILY HISTORY: Family History  Problem Relation Age of Onset   Stroke Mother    Lung cancer Father    Leukemia Neg Hx    Breast cancer Neg Hx    Ovarian cancer Neg Hx    Colon cancer Neg Hx    Endometrial cancer Neg Hx    Prostate cancer Neg Hx    Pancreatic cancer Neg Hx     ALLERGIES:  is allergic to codeine, evista [raloxifene], alendronate, ibandronic acid, and risedronate.  MEDICATIONS:  Current Outpatient Medications  Medication Sig Dispense Refill   amLODipine (NORVASC) 10 MG tablet Take 1 tablet (10 mg total) by mouth at bedtime. 30 tablet 0   atorvastatin (LIPITOR) 40 MG tablet Take 40 mg by mouth daily.      Brinzolamide-Brimonidine (SIMBRINZA) 1-0.2 % SUSP Place 1 drop into both eyes daily at  12 noon. (Patient not taking: Reported on 01/06/2021)     Calcium Carbonate-Vitamin D (CALCIUM-D PO) Take 600 mg by mouth daily.     Cholecalciferol (VITAMIN D3) 2000 units TABS Take 2,000 Units by mouth daily.      dorzolamide-timolol (COSOPT) 22.3-6.8 MG/ML ophthalmic solution Place 1 drop into both eyes daily.      eltrombopag (PROMACTA) 25 MG tablet Take 1 tablet (25 mg total) by mouth daily. Take on an empty stomach, 1 hour before a meal or 2 hours after. 30 tablet 11   furosemide (LASIX) 20 MG tablet Take 40 mg for 3 days then resume your 20 mg daily (Patient taking differently: Take 20 mg by mouth daily.) 30 tablet     ibandronate (BONIVA) 150 MG tablet Take 150 mg by mouth every morning.     latanoprost (XALATAN) 0.005 % ophthalmic solution Place 1 drop into both eyes at bedtime.      lisinopril (ZESTRIL) 40 MG tablet Take 1 tablet (40 mg total) by mouth daily.     Multiple Vitamin (MULTIVITAMIN WITH MINERALS) TABS tablet Take 1 tablet by mouth daily.     sertraline (ZOLOFT) 25 MG tablet Take 25 mg by mouth daily.     Current Facility-Administered Medications  Medication Dose Route Frequency Provider Last Rate Last Admin   0.9 %  sodium chloride infusion   Intravenous PRN Kathyrn Lass, MD        REVIEW OF SYSTEMS:    10 Point review of Systems was done is negative except as noted above.   PHYSICAL EXAMINATION: .There were no vitals taken for this visit.   GENERAL:alert, in no acute distress and comfortable SKIN: no acute rashes, no significant lesions EYES: conjunctiva are pink and non-injected, sclera anicteric OROPHARYNX: MMM, no exudates, no oropharyngeal erythema or ulceration NECK: supple, no JVD LYMPH:  no palpable lymphadenopathy in the cervical, axillary or inguinal regions LUNGS: clear to auscultation b/l with normal respiratory effort HEART: regular rate & rhythm ABDOMEN:  normoactive bowel sounds , non tender, not distended. Extremity: no pedal edema PSYCH: alert & oriented x 3 with fluent speech NEURO: no focal motor/sensory deficits    LABORATORY DATA:  I have reviewed the data as listed  .    Latest Ref Rng & Units 07/30/2022    8:56 AM 05/07/2022    9:30 AM 02/04/2022   10:20 AM  CBC  WBC 4.0 - 10.5 K/uL 6.7  6.9  5.8   Hemoglobin 12.0 - 15.0 g/dL 14.7  14.9  14.4   Hematocrit 36.0 - 46.0 % 42.8  42.2  40.9   Platelets 150 - 400 K/uL 200  171  150     .    Latest Ref Rng & Units 07/30/2022    8:56 AM 05/07/2022    9:30 AM 02/04/2022   10:20 AM  CMP  Glucose 70 - 99 mg/dL 81  93  95   BUN 8 - 23 mg/dL 27  15  19   $ Creatinine 0.44 - 1.00 mg/dL 0.93  0.81  0.89    Sodium 135 - 145 mmol/L 143  142  142   Potassium 3.5 - 5.1 mmol/L 4.8  4.0  4.0   Chloride 98 - 111 mmol/L 106  107  108   CO2 22 - 32 mmol/L 33  29  30   Calcium 8.9 - 10.3 mg/dL 10.2  9.7  9.3   Total Protein 6.5 - 8.1 g/dL 7.3  6.9  6.6  Total Bilirubin 0.3 - 1.2 mg/dL 0.7  0.7  0.6   Alkaline Phos 38 - 126 U/L 53  55  55   AST 15 - 41 U/L 23  19  19   $ ALT 0 - 44 U/L 12  10  10    $ . Lab Results  Component Value Date   LDH 156 07/30/2022    02/27/2019 FISH:    RADIOGRAPHIC STUDIES: I have personally reviewed the radiological images as listed and agreed with the findings in the report.  No results found.  ASSESSMENT & PLAN:   #1 CLL-currently in remission Previous CLL prognostic FISH panel without detectable mutations. Has been Rai stage 0. 08/31/2019 CT C/A/P ([id number]) ([id number]) revealed "1. Mild multistation lymphadenopathy in the retroperitoneum and bilateral pelvis as detailed, compatible with lymphoma. Normal size spleen. No thoracic adenopathy."  #2 s/p severe thrombocytopenia with platelet counts around 5k likely related to CLL.  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  Currently no anemia.  #4 h/o intraparenchymal bleed- rpt CT head stable (due to uncontrolled HTN and thrombocytopenia)  PLAN:  -Discussed lab results on 10/29/22  with patient. CBC showed WBC of 6.2 K, hemoglobin of 15.2, and platelets of 151 K. Platelets normal. Patient has no lab or clinical evidence of CLL progression at this time. Patient has no notable toxicities from her current dose of Promacta. -Will lower Promacta dose to 6 days a week  -repeat labs in 4 weeks -recommend patient to receive RSV vaccination  FOLLOW UP: Phone visit with Dr Irene Limbo in 1 month Labs 1 day prior  The total time spent in the appointment was *** minutes* .  All of the patient's questions were  answered with apparent satisfaction. The patient knows to call the clinic with any problems, questions or concerns.   Sullivan Lone MD MS AAHIVMS [facility] Hematology/Oncology Physician [facility]  .*Total Encounter Time as defined by the Centers for Medicare and Medicaid Services includes, in addition to the face-to-face time of a patient visit (documented in the note above) non-face-to-face time: obtaining and reviewing outside history, ordering and reviewing medications, tests or procedures, care coordination (communications with other health care professionals or caregivers) and documentation in the medical record.   I,Mitra Faeizi,acting as a Education administrator for Sullivan Lone, MD.,have documented all relevant documentation on the behalf of Sullivan Lone, MD,as directed by  Sullivan Lone, MD while in the presence of Sullivan Lone, MD.  ***

## 2022-11-03 DIAGNOSIS — I10 Essential (primary) hypertension: Secondary | ICD-10-CM | POA: Diagnosis not present

## 2022-11-03 DIAGNOSIS — C9110 Chronic lymphocytic leukemia of B-cell type not having achieved remission: Secondary | ICD-10-CM | POA: Diagnosis not present

## 2022-11-03 DIAGNOSIS — Z6822 Body mass index (BMI) 22.0-22.9, adult: Secondary | ICD-10-CM | POA: Diagnosis not present

## 2022-11-03 DIAGNOSIS — F4320 Adjustment disorder, unspecified: Secondary | ICD-10-CM | POA: Diagnosis not present

## 2022-11-03 DIAGNOSIS — E782 Mixed hyperlipidemia: Secondary | ICD-10-CM | POA: Diagnosis not present

## 2022-11-03 DIAGNOSIS — H409 Unspecified glaucoma: Secondary | ICD-10-CM | POA: Diagnosis not present

## 2022-11-03 DIAGNOSIS — M810 Age-related osteoporosis without current pathological fracture: Secondary | ICD-10-CM | POA: Diagnosis not present

## 2022-11-03 DIAGNOSIS — R54 Age-related physical debility: Secondary | ICD-10-CM | POA: Diagnosis not present

## 2022-11-23 ENCOUNTER — Other Ambulatory Visit: Payer: Self-pay

## 2022-11-23 ENCOUNTER — Inpatient Hospital Stay: Payer: PPO | Attending: Hematology

## 2022-11-23 DIAGNOSIS — D693 Immune thrombocytopenic purpura: Secondary | ICD-10-CM | POA: Insufficient documentation

## 2022-11-23 DIAGNOSIS — I10 Essential (primary) hypertension: Secondary | ICD-10-CM | POA: Insufficient documentation

## 2022-11-23 DIAGNOSIS — C9110 Chronic lymphocytic leukemia of B-cell type not having achieved remission: Secondary | ICD-10-CM

## 2022-11-23 DIAGNOSIS — Z79899 Other long term (current) drug therapy: Secondary | ICD-10-CM | POA: Insufficient documentation

## 2022-11-23 LAB — CBC WITH DIFFERENTIAL (CANCER CENTER ONLY)
Abs Immature Granulocytes: 0 10*3/uL (ref 0.00–0.07)
Basophils Absolute: 0 10*3/uL (ref 0.0–0.1)
Basophils Relative: 1 %
Eosinophils Absolute: 0.2 10*3/uL (ref 0.0–0.5)
Eosinophils Relative: 3 %
HCT: 42.9 % (ref 36.0–46.0)
Hemoglobin: 14.6 g/dL (ref 12.0–15.0)
Immature Granulocytes: 0 %
Lymphocytes Relative: 47 %
Lymphs Abs: 3 10*3/uL (ref 0.7–4.0)
MCH: 31.9 pg (ref 26.0–34.0)
MCHC: 34 g/dL (ref 30.0–36.0)
MCV: 93.9 fL (ref 80.0–100.0)
Monocytes Absolute: 0.6 10*3/uL (ref 0.1–1.0)
Monocytes Relative: 9 %
Neutro Abs: 2.4 10*3/uL (ref 1.7–7.7)
Neutrophils Relative %: 40 %
Platelet Count: 153 10*3/uL (ref 150–400)
RBC: 4.57 MIL/uL (ref 3.87–5.11)
RDW: 12.5 % (ref 11.5–15.5)
WBC Count: 6.2 10*3/uL (ref 4.0–10.5)
nRBC: 0 % (ref 0.0–0.2)

## 2022-11-23 LAB — CMP (CANCER CENTER ONLY)
ALT: 12 U/L (ref 0–44)
AST: 20 U/L (ref 15–41)
Albumin: 4.4 g/dL (ref 3.5–5.0)
Alkaline Phosphatase: 58 U/L (ref 38–126)
Anion gap: 5 (ref 5–15)
BUN: 22 mg/dL (ref 8–23)
CO2: 32 mmol/L (ref 22–32)
Calcium: 10.3 mg/dL (ref 8.9–10.3)
Chloride: 106 mmol/L (ref 98–111)
Creatinine: 0.88 mg/dL (ref 0.44–1.00)
GFR, Estimated: >60 mL/min (ref >60)
Glucose, Bld: 91 mg/dL (ref 70–99)
Potassium: 4.6 mmol/L (ref 3.5–5.1)
Sodium: 143 mmol/L (ref 135–145)
Total Bilirubin: 0.6 mg/dL (ref 0.3–1.2)
Total Protein: 7.2 g/dL (ref 6.5–8.1)

## 2022-11-23 LAB — LACTATE DEHYDROGENASE: LDH: 142 U/L (ref 98–192)

## 2022-11-24 ENCOUNTER — Inpatient Hospital Stay (HOSPITAL_BASED_OUTPATIENT_CLINIC_OR_DEPARTMENT_OTHER): Payer: PPO | Admitting: Hematology

## 2022-11-24 DIAGNOSIS — D693 Immune thrombocytopenic purpura: Secondary | ICD-10-CM | POA: Diagnosis not present

## 2022-11-24 DIAGNOSIS — C9110 Chronic lymphocytic leukemia of B-cell type not having achieved remission: Secondary | ICD-10-CM | POA: Diagnosis not present

## 2022-11-24 NOTE — Progress Notes (Signed)
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  Patient was last seen by me on 10/29/2022 and was doing well overall with no new medical concerns.  I connected with ARABELA EIRING on 11/24/22 at  8:40 AM EDT by telephone visit and verified that I am speaking with the correct person using two identifiers.   I discussed the limitations, risks, security and privacy concerns of performing an evaluation and management service by telemedicine and the availability of in-person appointments. I also discussed with the patient that there may be a patient responsible charge related to this service. The patient expressed understanding and agreed to proceed.   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 She has been tolerating her current dose of Promacta well with no toxicities.  MEDICAL HISTORY:  Past Medical History:  Diagnosis Date   Anxiety    CLL (chronic lymphocytic leukemia) ([place]) 11/2018   Glaucoma    Hyperlipemia    Hypertension    Osteoporosis     SURGICAL HISTORY: No past surgical history on file.  SOCIAL HISTORY: Social History   Socioeconomic History   Marital status: Married    Spouse  name: Not on file   Number of children: 1   Years of education: HS   Highest education level: Not on file  Occupational History   Occupation: Retired  Tobacco Use   Smoking status: Never   Smokeless tobacco: Never  Vaping Use   Vaping Use: Never used  Substance and Sexual Activity   Alcohol use: No    Alcohol/week: 0.0 standard drinks of alcohol   Drug use: No   Sexual activity: Yes    Birth control/protection: Post-menopausal  Other Topics Concern   Not on file  Social History Narrative   Drinks 2 cups of coffee   Social Determinants of Health   Financial Resource Strain: Not on file  Food Insecurity: Not on file  Transportation Needs: Not on file  Physical Activity: Not on file  Stress: Not on file  Social Connections: Not on file  Intimate Partner Violence: Not on file    FAMILY HISTORY: Family History  Problem Relation Age of Onset   Stroke Mother    Lung cancer Father    Leukemia Neg Hx    Breast cancer Neg Hx    Ovarian cancer Neg Hx    Colon cancer Neg Hx    Endometrial cancer Neg Hx    Prostate cancer Neg Hx    Pancreatic cancer Neg Hx     ALLERGIES:  is allergic to codeine, evista [raloxifene], alendronate, ibandronic acid, and risedronate.  MEDICATIONS:  Current Outpatient Medications  Medication Sig Dispense Refill   amLODipine (NORVASC) 10 MG tablet Take 1 tablet (10 mg  total) by mouth at bedtime. 30 tablet 0   atorvastatin (LIPITOR) 40 MG tablet Take 40 mg by mouth daily.      Brinzolamide-Brimonidine (SIMBRINZA) 1-0.2 % SUSP Place 1 drop into both eyes daily at 12 noon. (Patient not taking: Reported on 01/06/2021)     Calcium Carbonate-Vitamin D (CALCIUM-D PO) Take 600 mg by mouth daily.     Cholecalciferol (VITAMIN D3) 2000 units TABS Take 2,000 Units by mouth daily.      dorzolamide-timolol (COSOPT) 22.3-6.8 MG/ML ophthalmic solution Place 1 drop into both eyes daily.      eltrombopag (PROMACTA) 25 MG tablet Take 1 tablet (25 mg total) by mouth  daily. Take on an empty stomach, 1 hour before a meal or 2 hours after. 30 tablet 11   furosemide (LASIX) 20 MG tablet Take 40 mg for 3 days then resume your 20 mg daily (Patient taking differently: Take 20 mg by mouth daily.) 30 tablet    ibandronate (BONIVA) 150 MG tablet Take 150 mg by mouth every morning.     latanoprost (XALATAN) 0.005 % ophthalmic solution Place 1 drop into both eyes at bedtime.      lisinopril (ZESTRIL) 40 MG tablet Take 1 tablet (40 mg total) by mouth daily.     Multiple Vitamin (MULTIVITAMIN WITH MINERALS) TABS tablet Take 1 tablet by mouth daily.     sertraline (ZOLOFT) 25 MG tablet Take 25 mg by mouth daily.     Current Facility-Administered Medications  Medication Dose Route Frequency Provider Last Rate Last Admin   0.9 %  sodium chloride infusion   Intravenous PRN Kathyrn Lass, MD        REVIEW OF SYSTEMS:    10 Point review of Systems was done is negative except as noted above.   PHYSICAL EXAMINATION: PHONE VISIT  LABORATORY DATA:  I have reviewed the data as listed      Latest Ref Rng & Units 11/23/2022    9:18 AM 10/29/2022    8:52 AM 07/30/2022    8:56 AM  CBC  WBC 4.0 - 10.5 K/uL 6.2  6.2  6.7   Hemoglobin 12.0 - 15.0 g/dL 14.6  15.2  14.7   Hematocrit 36.0 - 46.0 % 42.9  44.5  42.8   Platelets 150 - 400 K/uL 153  151  200     .    Latest Ref Rng & Units 11/23/2022    9:18 AM 10/29/2022    8:52 AM 07/30/2022    8:56 AM  CMP  Glucose 70 - 99 mg/dL 91  93  81   BUN 8 - 23 mg/dL 22  20  27    Creatinine 0.44 - 1.00 mg/dL 0.88  0.88  0.93   Sodium 135 - 145 mmol/L 143  143  143   Potassium 3.5 - 5.1 mmol/L 4.6  4.4  4.8   Chloride 98 - 111 mmol/L 106  108  106   CO2 22 - 32 mmol/L 32  26  33   Calcium 8.9 - 10.3 mg/dL 10.3  9.1  10.2   Total Protein 6.5 - 8.1 g/dL 7.2  7.0  7.3   Total Bilirubin 0.3 - 1.2 mg/dL 0.6  0.7  0.7   Alkaline Phos 38 - 126 U/L 58  61  53   AST 15 - 41 U/L 20  22  23    ALT 0 - 44 U/L 12  11  12     . Lab  Results  Component Value  Date   LDH 142 11/23/2022    02/27/2019 FISH:    RADIOGRAPHIC STUDIES: I have personally reviewed the radiological images as listed and agreed with the findings in the report.  No results found.  ASSESSMENT & PLAN:   #1 CLL-currently in remission Previous CLL prognostic FISH panel without detectable mutations. Has been Rai stage 0. 08/31/2019 CT C/A/P ([id number]) ([id number]) revealed "1. Mild multistation lymphadenopathy in the retroperitoneum and bilateral pelvis as detailed, compatible with lymphoma. Normal size spleen. No thoracic adenopathy."  #2 s/p severe thrombocytopenia with platelet counts around 5k likely related to CLL.  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  Currently no anemia.  #4 h/o intraparenchymal bleed- rpt CT head stable (due to uncontrolled HTN and thrombocytopenia)  PLAN:  -Discussed lab results on 11/23/2022 with patient. CBC showed WBC of 6.2K, hemoglobin of 14.6, and platelets remain normal at 153K. -Patient has no notable toxicities from her current dose of Promacta. -Patient has no lab or clinical evidence of CLL progression at this time. -Will lower dose of Promacta to 25mg  po daily 5 days a week -repeat labs in 6 weeks  FOLLOW-UP: Phone visit with dr Irene Limbo in 6 weeks Labs 1 day prior to Phone visit  The total time spent in the appointment was 12 minutes* .  All of the patient's questions were answered with apparent satisfaction. The patient knows to call the clinic with any problems, questions or concerns.   Sullivan Lone MD MS AAHIVMS [facility] Hematology/Oncology Physician [facility]  .*Total Encounter Time as defined by the Centers for Medicare and Medicaid Services includes, in addition to the face-to-face time of a patient visit (documented in the note above) non-face-to-face time: obtaining and  reviewing outside history, ordering and reviewing medications, tests or procedures, care coordination (communications with other health care professionals or caregivers) and documentation in the medical record.   I,Mitra Faeizi,acting as a Education administrator for Sullivan Lone, MD.,have documented all relevant documentation on the behalf of Sullivan Lone, MD,as directed by  Sullivan Lone, MD while in the presence of Sullivan Lone, MD.  .I have reviewed the above documentation for accuracy and completeness, and I agree with the above. [name] MD

## 2022-11-30 ENCOUNTER — Telehealth: Payer: Self-pay | Admitting: Hematology

## 2022-11-30 NOTE — Telephone Encounter (Signed)
Called patient per voicemail received to schedule f/u. Patient scheduled and notified.

## 2023-01-10 ENCOUNTER — Other Ambulatory Visit: Payer: Self-pay

## 2023-01-10 DIAGNOSIS — C9110 Chronic lymphocytic leukemia of B-cell type not having achieved remission: Secondary | ICD-10-CM

## 2023-01-11 ENCOUNTER — Other Ambulatory Visit: Payer: Self-pay

## 2023-01-11 ENCOUNTER — Inpatient Hospital Stay: Payer: PPO | Attending: Hematology

## 2023-01-11 DIAGNOSIS — C9110 Chronic lymphocytic leukemia of B-cell type not having achieved remission: Secondary | ICD-10-CM | POA: Diagnosis not present

## 2023-01-11 LAB — CBC WITH DIFFERENTIAL (CANCER CENTER ONLY)
Abs Immature Granulocytes: 0.01 10*3/uL (ref 0.00–0.07)
Basophils Absolute: 0 10*3/uL (ref 0.0–0.1)
Basophils Relative: 0 %
Eosinophils Absolute: 0.2 10*3/uL (ref 0.0–0.5)
Eosinophils Relative: 3 %
HCT: 41 % (ref 36.0–46.0)
Hemoglobin: 14 g/dL (ref 12.0–15.0)
Immature Granulocytes: 0 %
Lymphocytes Relative: 44 %
Lymphs Abs: 2.7 10*3/uL (ref 0.7–4.0)
MCH: 31.9 pg (ref 26.0–34.0)
MCHC: 34.1 g/dL (ref 30.0–36.0)
MCV: 93.4 fL (ref 80.0–100.0)
Monocytes Absolute: 0.6 10*3/uL (ref 0.1–1.0)
Monocytes Relative: 9 %
Neutro Abs: 2.7 10*3/uL (ref 1.7–7.7)
Neutrophils Relative %: 44 %
Platelet Count: 139 10*3/uL — ABNORMAL LOW (ref 150–400)
RBC: 4.39 MIL/uL (ref 3.87–5.11)
RDW: 12.7 % (ref 11.5–15.5)
WBC Count: 6.1 10*3/uL (ref 4.0–10.5)
nRBC: 0 % (ref 0.0–0.2)

## 2023-01-11 LAB — CMP (CANCER CENTER ONLY)
ALT: 10 U/L (ref 0–44)
AST: 20 U/L (ref 15–41)
Albumin: 4.3 g/dL (ref 3.5–5.0)
Alkaline Phosphatase: 48 U/L (ref 38–126)
Anion gap: 6 (ref 5–15)
BUN: 17 mg/dL (ref 8–23)
CO2: 28 mmol/L (ref 22–32)
Calcium: 9.7 mg/dL (ref 8.9–10.3)
Chloride: 108 mmol/L (ref 98–111)
Creatinine: 0.85 mg/dL (ref 0.44–1.00)
GFR, Estimated: >60 mL/min (ref >60)
Glucose, Bld: 91 mg/dL (ref 70–99)
Potassium: 4 mmol/L (ref 3.5–5.1)
Sodium: 142 mmol/L (ref 135–145)
Total Bilirubin: 0.6 mg/dL (ref 0.3–1.2)
Total Protein: 6.4 g/dL — ABNORMAL LOW (ref 6.5–8.1)

## 2023-01-11 LAB — LACTATE DEHYDROGENASE: LDH: 141 U/L (ref 98–192)

## 2023-01-13 NOTE — Progress Notes (Signed)
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  Patient was last connected with me on 01/13/2023 via telemedicine visit and was doing well overall with no new medical concerns.   I connected with Selena Branch on 01/14/2023 at  8:40 AM EDT by telephone visit and verified that I am speaking with the correct person using two identifiers.   I discussed the limitations, risks, security and privacy concerns of performing an evaluation and management service by telemedicine and the availability of in-person appointments. I also discussed with the patient that there may be a patient responsible charge related to this service. The patient expressed understanding and agreed to proceed.   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  SOCIAL HISTORY: Social History   Socioeconomic History   Marital status: Married    Spouse name: Not on file    Number of children: 1   Years of education: HS   Highest education level: Not on file  Occupational History   Occupation: Retired  Tobacco Use   Smoking status: Never   Smokeless tobacco: Never  Vaping Use   Vaping Use: Never used  Substance and Sexual Activity   Alcohol use: No    Alcohol/week: 0.0 standard drinks of alcohol   Drug use: No   Sexual activity: Yes    Birth control/protection: Post-menopausal  Other Topics Concern   Not on file  Social History Narrative   Drinks 2 cups of coffee   Social Determinants of Health   Financial Resource Strain: Not on file  Food Insecurity: Not on file  Transportation Needs: Not on file  Physical Activity: Not on file  Stress: Not on file  Social Connections: Not on file  Intimate Partner Violence: Not on file    FAMILY HISTORY: Family History  Problem Relation Age of Onset   Stroke Mother    Lung cancer Father    Leukemia Neg Hx    Breast cancer Neg Hx    Ovarian cancer Neg Hx    Colon cancer Neg Hx    Endometrial cancer Neg Hx    Prostate cancer Neg Hx    Pancreatic cancer Neg Hx     ALLERGIES:  is allergic to codeine, evista [raloxifene], alendronate, ibandronic acid, and risedronate.  MEDICATIONS:  Current Outpatient Medications  Medication Sig Dispense Refill   amLODipine (NORVASC) 10 MG tablet Take 1 tablet (10 mg total) by  mouth at bedtime. 30 tablet 0   atorvastatin (LIPITOR) 40 MG tablet Take 40 mg by mouth daily.      Brinzolamide-Brimonidine (SIMBRINZA) 1-0.2 % SUSP Place 1 drop into both eyes daily at 12 noon. (Patient not taking: Reported on 01/06/2021)     Calcium Carbonate-Vitamin D (CALCIUM-D PO) Take 600 mg by mouth daily.     Cholecalciferol (VITAMIN D3) 2000 units TABS Take 2,000 Units by mouth daily.      dorzolamide-timolol (COSOPT) 22.3-6.8 MG/ML ophthalmic solution Place 1 drop into both eyes daily.      eltrombopag (PROMACTA) 25 MG tablet Take 1 tablet (25 mg total) by mouth daily. Take on an  empty stomach, 1 hour before a meal or 2 hours after. 30 tablet 11   furosemide (LASIX) 20 MG tablet Take 40 mg for 3 days then resume your 20 mg daily (Patient taking differently: Take 20 mg by mouth daily.) 30 tablet    ibandronate (BONIVA) 150 MG tablet Take 150 mg by mouth every morning.     latanoprost (XALATAN) 0.005 % ophthalmic solution Place 1 drop into both eyes at bedtime.      lisinopril (ZESTRIL) 40 MG tablet Take 1 tablet (40 mg total) by mouth daily.     Multiple Vitamin (MULTIVITAMIN WITH MINERALS) TABS tablet Take 1 tablet by mouth daily.     sertraline (ZOLOFT) 25 MG tablet Take 25 mg by mouth daily.     Current Facility-Administered Medications  Medication Dose Route Frequency Provider Last Rate Last Admin   0.9 %  sodium chloride infusion   Intravenous PRN Sigmund Hazel, MD        REVIEW OF SYSTEMS:    10 Point review of Systems was done is negative except as noted above.   PHYSICAL EXAMINATION: TELEMEDICINE VISIT  LABORATORY DATA:  I have reviewed the data as listed      Latest Ref Rng & Units 01/11/2023    9:13 AM 11/23/2022    9:18 AM 10/29/2022    8:52 AM  CBC  WBC 4.0 - 10.5 K/uL 6.1  6.2  6.2   Hemoglobin 12.0 - 15.0 g/dL 16.1  09.6  04.5   Hematocrit 36.0 - 46.0 % 41.0  42.9  44.5   Platelets 150 - 400 K/uL 139  153  151     .    Latest Ref Rng & Units 01/11/2023    9:13 AM 11/23/2022    9:18 AM 10/29/2022    8:52 AM  CMP  Glucose 70 - 99 mg/dL 91  91  93   BUN 8 - 23 mg/dL 17  22  20    Creatinine 0.44 - 1.00 mg/dL 4.09  8.11  9.14   Sodium 135 - 145 mmol/L 142  143  143   Potassium 3.5 - 5.1 mmol/L 4.0  4.6  4.4   Chloride 98 - 111 mmol/L 108  106  108   CO2 22 - 32 mmol/L 28  32  26   Calcium 8.9 - 10.3 mg/dL 9.7  78.2  9.1   Total Protein 6.5 - 8.1 g/dL 6.4  7.2  7.0   Total Bilirubin 0.3 - 1.2 mg/dL 0.6  0.6  0.7   Alkaline Phos 38 - 126 U/L 48  58  61   AST 15 - 41 U/L 20  20  22    ALT 0 - 44 U/L 10  12  11     . Lab Results  Component  Value Date  LDH 141 01/11/2023    02/27/2019 FISH:    RADIOGRAPHIC STUDIES: I have personally reviewed the radiological images as listed and agreed with the findings in the report.  No results found.  ASSESSMENT & PLAN:   #1 CLL-currently in remission Previous CLL prognostic FISH panel without detectable mutations. Has been Rai stage 0. 08/31/2019 CT C/A/P ([id number]) ([id number]) revealed "1. Mild multistation lymphadenopathy in the retroperitoneum and bilateral pelvis as detailed, compatible with lymphoma. Normal size spleen. No thoracic adenopathy."  #2 s/p severe thrombocytopenia with platelet counts around 5k likely related to CLL.  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  Currently no anemia.  #4 h/o intraparenchymal bleed- rpt CT head stable (due to uncontrolled HTN and thrombocytopenia)  PLAN:  -Discussed lab results from 01/11/2023 in detail with patient. CBC showed normal WBC of 6.1K, normal hemoglobin of 14.0, and platelets stable at 139K. -Patient has no notable toxicities from her current dose of Promacta. -Patient has no lab or clinical evidence of CLL progression at this time. -continue to lower dose of Promacta to 25mg  po 3 days a week: Monday, Wednesday, Friday -continue to monitor with labs in 3 weeks  FOLLOW-UP: Phone visit with Dr Candise Che in 3 weeks Labs 1 day prior to phone visit  The total time spent in the appointment was 12 minutes* .  All of the patient's questions were answered with apparent satisfaction. The patient knows to call the clinic with any problems, questions or concerns.   Wyvonnia Lora MD MS AAHIVMS [facility] Hematology/Oncology Physician [facility]  .*Total Encounter Time as defined by the Centers for Medicare and Medicaid Services includes, in addition to the face-to-face time of a patient visit (documented in the note  above) non-face-to-face time: obtaining and reviewing outside history, ordering and reviewing medications, tests or procedures, care coordination (communications with other health care professionals or caregivers) and documentation in the medical record.    I,Mitra Faeizi,acting as a Neurosurgeon for Wyvonnia Lora, MD.,have documented all relevant documentation on the behalf of Wyvonnia Lora, MD,as directed by  Wyvonnia Lora, MD while in the presence of Wyvonnia Lora, MD.  .I have reviewed the above documentation for accuracy and completeness, and I agree with the above. Johney Maine MD

## 2023-01-14 ENCOUNTER — Inpatient Hospital Stay: Payer: PPO | Attending: Hematology | Admitting: Hematology

## 2023-01-14 DIAGNOSIS — C9110 Chronic lymphocytic leukemia of B-cell type not having achieved remission: Secondary | ICD-10-CM | POA: Insufficient documentation

## 2023-01-14 DIAGNOSIS — D693 Immune thrombocytopenic purpura: Secondary | ICD-10-CM | POA: Diagnosis not present

## 2023-01-14 MED ORDER — ELTROMBOPAG OLAMINE 25 MG PO TABS: ORAL_TABLET | ORAL | 11 refills | Status: DC

## 2023-01-19 DIAGNOSIS — H401131 Primary open-angle glaucoma, bilateral, mild stage: Secondary | ICD-10-CM | POA: Diagnosis not present

## 2023-01-31 ENCOUNTER — Other Ambulatory Visit: Payer: Self-pay

## 2023-01-31 DIAGNOSIS — C9110 Chronic lymphocytic leukemia of B-cell type not having achieved remission: Secondary | ICD-10-CM

## 2023-02-01 ENCOUNTER — Inpatient Hospital Stay: Payer: PPO

## 2023-02-01 DIAGNOSIS — D693 Immune thrombocytopenic purpura: Secondary | ICD-10-CM | POA: Diagnosis not present

## 2023-02-01 DIAGNOSIS — C9110 Chronic lymphocytic leukemia of B-cell type not having achieved remission: Secondary | ICD-10-CM | POA: Diagnosis not present

## 2023-02-01 LAB — CMP (CANCER CENTER ONLY)
ALT: 9 U/L (ref 0–44)
AST: 21 U/L (ref 15–41)
Albumin: 4.3 g/dL (ref 3.5–5.0)
Alkaline Phosphatase: 53 U/L (ref 38–126)
Anion gap: 8 (ref 5–15)
BUN: 18 mg/dL (ref 8–23)
CO2: 29 mmol/L (ref 22–32)
Calcium: 9.3 mg/dL (ref 8.9–10.3)
Chloride: 107 mmol/L (ref 98–111)
Creatinine: 0.81 mg/dL (ref 0.44–1.00)
GFR, Estimated: >60 mL/min (ref >60)
Glucose, Bld: 108 mg/dL — ABNORMAL HIGH (ref 70–99)
Potassium: 3.9 mmol/L (ref 3.5–5.1)
Sodium: 144 mmol/L (ref 135–145)
Total Bilirubin: 0.5 mg/dL (ref 0.3–1.2)
Total Protein: 6.6 g/dL (ref 6.5–8.1)

## 2023-02-01 LAB — CBC WITH DIFFERENTIAL (CANCER CENTER ONLY)
Abs Immature Granulocytes: 0.01 10*3/uL (ref 0.00–0.07)
Basophils Absolute: 0 10*3/uL (ref 0.0–0.1)
Basophils Relative: 1 %
Eosinophils Absolute: 0.2 10*3/uL (ref 0.0–0.5)
Eosinophils Relative: 3 %
HCT: 41 % (ref 36.0–46.0)
Hemoglobin: 14.3 g/dL (ref 12.0–15.0)
Immature Granulocytes: 0 %
Lymphocytes Relative: 47 %
Lymphs Abs: 2.8 10*3/uL (ref 0.7–4.0)
MCH: 32.4 pg (ref 26.0–34.0)
MCHC: 34.9 g/dL (ref 30.0–36.0)
MCV: 93 fL (ref 80.0–100.0)
Monocytes Absolute: 0.6 10*3/uL (ref 0.1–1.0)
Monocytes Relative: 10 %
Neutro Abs: 2.4 10*3/uL (ref 1.7–7.7)
Neutrophils Relative %: 39 %
Platelet Count: 109 10*3/uL — ABNORMAL LOW (ref 150–400)
RBC: 4.41 MIL/uL (ref 3.87–5.11)
RDW: 12.7 % (ref 11.5–15.5)
WBC Count: 6 10*3/uL (ref 4.0–10.5)
nRBC: 0 % (ref 0.0–0.2)

## 2023-02-01 LAB — LACTATE DEHYDROGENASE: LDH: 147 U/L (ref 98–192)

## 2023-02-02 ENCOUNTER — Inpatient Hospital Stay (HOSPITAL_BASED_OUTPATIENT_CLINIC_OR_DEPARTMENT_OTHER): Payer: PPO | Admitting: Hematology

## 2023-02-02 DIAGNOSIS — D693 Immune thrombocytopenic purpura: Secondary | ICD-10-CM

## 2023-02-02 DIAGNOSIS — G40901 Epilepsy, unspecified, not intractable, with status epilepticus: Secondary | ICD-10-CM | POA: Diagnosis not present

## 2023-02-02 DIAGNOSIS — I700 Atherosclerosis of aorta: Secondary | ICD-10-CM | POA: Diagnosis not present

## 2023-02-02 DIAGNOSIS — C9110 Chronic lymphocytic leukemia of B-cell type not having achieved remission: Secondary | ICD-10-CM

## 2023-02-02 DIAGNOSIS — I10 Essential (primary) hypertension: Secondary | ICD-10-CM | POA: Diagnosis not present

## 2023-02-02 DIAGNOSIS — R4701 Aphasia: Secondary | ICD-10-CM | POA: Diagnosis not present

## 2023-02-02 DIAGNOSIS — R29818 Other symptoms and signs involving the nervous system: Secondary | ICD-10-CM | POA: Diagnosis not present

## 2023-02-02 NOTE — Progress Notes (Signed)
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  Patient was last connected with me on 01/14/2023 via telemedicine visit and was doing well overall with no new medical concerns.   I connected with Selena Branch on 02/02/23 at  8:40 AM EDT by telephone visit and verified that I am speaking with the correct person using two identifiers.   I discussed the limitations, risks, security and privacy concerns of performing an evaluation and management service by telemedicine and the availability of in-person appointments. I also discussed with the patient that there may be a patient responsible charge related to this service. The patient expressed understanding and agreed to proceed.   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 She has been taking Promacta as prescribed with no toxicity issues. She reports normal at-home BP readings and denies taking any new medication or OTC pain medications.   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  SOCIAL HISTORY: Social History   Socioeconomic History   Marital status: Married    Spouse name: Not on file   Number of children: 1   Years of education: HS   Highest education level: Not on file  Occupational History   Occupation: Retired  Tobacco Use   Smoking status: Never   Smokeless tobacco: Never  Vaping Use   Vaping Use: Never used  Substance and Sexual Activity   Alcohol use: No    Alcohol/week: 0.0 standard drinks of alcohol   Drug use: No   Sexual activity: Yes    Birth control/protection: Post-menopausal  Other Topics Concern   Not on file  Social History Narrative   Drinks 2 cups of coffee   Social Determinants of Health   Financial Resource Strain: Not on file  Food Insecurity: Not on file  Transportation Needs: Not on file  Physical Activity: Not on file  Stress: Not on file  Social Connections: Not on file  Intimate Partner Violence: Not on file    FAMILY HISTORY: Family History  Problem Relation Age of Onset   Stroke Mother    Lung cancer Father    Leukemia Neg Hx    Breast cancer Neg Hx    Ovarian cancer Neg Hx    Colon cancer Neg Hx    Endometrial cancer Neg Hx    Prostate cancer Neg Hx    Pancreatic cancer Neg Hx     ALLERGIES:  is allergic to codeine, evista [raloxifene], alendronate, ibandronic acid, and risedronate.  MEDICATIONS:  Current Outpatient Medications  Medication Sig Dispense Refill   amLODipine (NORVASC) 10 MG tablet Take 1 tablet (10 mg total) by mouth at bedtime. 30 tablet 0   atorvastatin (LIPITOR) 40 MG tablet Take 40 mg by mouth daily.      Brinzolamide-Brimonidine (SIMBRINZA) 1-0.2 % SUSP Place 1 drop into both eyes daily at 12 noon. (Patient not taking: Reported on 01/06/2021)     Calcium Carbonate-Vitamin D (CALCIUM-D PO) Take 600 mg by mouth daily.     Cholecalciferol (VITAMIN D3) 2000 units TABS Take 2,000 Units by mouth daily.      dorzolamide-timolol (COSOPT) 22.3-6.8 MG/ML ophthalmic solution Place  1 drop into both eyes daily.      eltrombopag (PROMACTA) 25 MG tablet Take 1 tablet (25 mg) on Monday Wednesday and Friday .take on an empty stomach, 1 hour before a meal or 2 hours after. 30 tablet 11   furosemide (LASIX) 20 MG tablet Take 40 mg for 3 days then resume your 20 mg daily (Patient taking differently: Take 20 mg by mouth daily.) 30 tablet    ibandronate (BONIVA) 150 MG tablet Take 150 mg by mouth every morning.     latanoprost (XALATAN) 0.005 % ophthalmic solution Place 1 drop into both eyes at bedtime.      lisinopril (ZESTRIL) 40 MG tablet Take 1 tablet (40 mg total) by mouth daily.     Multiple Vitamin (MULTIVITAMIN WITH MINERALS) TABS tablet Take 1 tablet by mouth daily.     sertraline (ZOLOFT) 25 MG tablet Take 25 mg by mouth daily.     Current Facility-Administered Medications  Medication Dose Route Frequency Provider Last Rate Last Admin   0.9 %  sodium chloride infusion   Intravenous PRN Sigmund Hazel, MD        REVIEW OF SYSTEMS:    10 Point review of Systems was done is negative except as noted above.   PHYSICAL EXAMINATION: TELEMEDICINE VISIT  LABORATORY DATA:  I have reviewed the data as listed      Latest Ref Rng & Units 02/01/2023    9:23 AM 01/11/2023    9:13 AM 11/23/2022    9:18 AM  CBC  WBC 4.0 - 10.5 K/uL 6.0  6.1  6.2   Hemoglobin 12.0 - 15.0 g/dL 16.1  09.6  04.5   Hematocrit 36.0 - 46.0 % 41.0  41.0  42.9   Platelets 150 - 400 K/uL 109  139  153     .    Latest Ref Rng & Units 02/01/2023    9:23 AM 01/11/2023    9:13 AM 11/23/2022    9:18 AM  CMP  Glucose 70 - 99 mg/dL 409  91  91   BUN 8 - 23 mg/dL 18  17  22    Creatinine 0.44 - 1.00 mg/dL 8.11  9.14  7.82   Sodium 135 - 145 mmol/L 144  142  143   Potassium 3.5 - 5.1 mmol/L 3.9  4.0  4.6   Chloride 98 - 111 mmol/L 107  108  106   CO2 22 - 32 mmol/L 29  28  32   Calcium 8.9 - 10.3 mg/dL 9.3  9.7  95.6   Total Protein 6.5 - 8.1 g/dL 6.6  6.4  7.2   Total Bilirubin 0.3 - 1.2 mg/dL 0.5  0.6   0.6   Alkaline Phos 38 - 126 U/L 53  48  58   AST 15 - 41 U/L 21  20  20  ALT 0 - 44 U/L 9  10  12     . Lab Results  Component Value Date   LDH 147 02/01/2023    02/27/2019 FISH:    RADIOGRAPHIC STUDIES: I have personally reviewed the radiological images as listed and agreed with the findings in the report.  No results found.  ASSESSMENT & PLAN:   #1 CLL-currently in remission Previous CLL prognostic FISH panel without detectable mutations. Has been Rai stage 0. 08/31/2019 CT C/A/P ([id number]) ([id number]) revealed "1. Mild multistation lymphadenopathy in the retroperitoneum and bilateral pelvis as detailed, compatible with lymphoma. Normal size spleen. No thoracic adenopathy."  #2 s/p severe thrombocytopenia with platelet counts around 5k likely related to CLL.  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  Currently no anemia.  #4 h/o intraparenchymal bleed- rpt CT head stable (due to uncontrolled HTN and thrombocytopenia)  PLAN:  -Discussed lab results on 02/01/2023 in detail with patient. CBC showed WBC of 6.0K, hemoglobin of 14.3, and platelets of 109K. -Platelets slightly decreasing  -CMP normal -LDH normal -Patient has no notable toxicities from her current dose of Promacta. -Patient has no lab or clinical evidence of CLL progression at this time. -Continue low-dose 25MG  Promacta 3 days a week: Monday, Wednesday, Friday  -continue to monitor with labs in 6 weeks   FOLLOW-UP: Phone visit with Dr Candise Che in 6 weeks Labs 1 day prior to phone visit  The total time spent in the appointment was 12 minutes* .  All of the patient's questions were answered with apparent satisfaction. The patient knows to call the clinic with any problems, questions or concerns.   Wyvonnia Lora MD MS AAHIVMS [facility] Hematology/Oncology Physician [facility]  .*Total  Encounter Time as defined by the Centers for Medicare and Medicaid Services includes, in addition to the face-to-face time of a patient visit (documented in the note above) non-face-to-face time: obtaining and reviewing outside history, ordering and reviewing medications, tests or procedures, care coordination (communications with other health care professionals or caregivers) and documentation in the medical record.    I,Mitra Faeizi,acting as a Neurosurgeon for Wyvonnia Lora, MD.,have documented all relevant documentation on the behalf of Wyvonnia Lora, MD,as directed by  Wyvonnia Lora, MD while in the presence of Wyvonnia Lora, MD.  .I have reviewed the above documentation for accuracy and completeness, and I agree with the above. Johney Maine MD

## 2023-02-03 ENCOUNTER — Telehealth: Payer: Self-pay | Admitting: Hematology

## 2023-02-04 ENCOUNTER — Inpatient Hospital Stay (HOSPITAL_COMMUNITY)
Admission: EM | Admit: 2023-02-04 | Discharge: 2023-02-28 | DRG: 100 | Disposition: A | Discharge status: Skilled Nursing Facility | Payer: PPO | Attending: Internal Medicine | Admitting: Internal Medicine

## 2023-02-04 ENCOUNTER — Emergency Department (HOSPITAL_COMMUNITY): Payer: PPO

## 2023-02-04 ENCOUNTER — Encounter (HOSPITAL_COMMUNITY): Payer: Self-pay

## 2023-02-04 ENCOUNTER — Emergency Department: Payer: PPO

## 2023-02-04 ENCOUNTER — Other Ambulatory Visit: Payer: Self-pay

## 2023-02-04 DIAGNOSIS — F419 Anxiety disorder, unspecified: Secondary | ICD-10-CM | POA: Diagnosis present

## 2023-02-04 DIAGNOSIS — Z862 Personal history of diseases of the blood and blood-forming organs and certain disorders involving the immune mechanism: Secondary | ICD-10-CM

## 2023-02-04 DIAGNOSIS — Z66 Do not resuscitate: Secondary | ICD-10-CM | POA: Diagnosis present

## 2023-02-04 DIAGNOSIS — Z79899 Other long term (current) drug therapy: Secondary | ICD-10-CM

## 2023-02-04 DIAGNOSIS — Z91199 Patient's noncompliance with other medical treatment and regimen due to unspecified reason: Secondary | ICD-10-CM

## 2023-02-04 DIAGNOSIS — E7220 Disorder of urea cycle metabolism, unspecified: Secondary | ICD-10-CM | POA: Diagnosis not present

## 2023-02-04 DIAGNOSIS — I10 Essential (primary) hypertension: Secondary | ICD-10-CM | POA: Diagnosis present

## 2023-02-04 DIAGNOSIS — Z7409 Other reduced mobility: Secondary | ICD-10-CM | POA: Diagnosis present

## 2023-02-04 DIAGNOSIS — E876 Hypokalemia: Secondary | ICD-10-CM | POA: Diagnosis present

## 2023-02-04 DIAGNOSIS — R569 Unspecified convulsions: Secondary | ICD-10-CM

## 2023-02-04 DIAGNOSIS — Z781 Physical restraint status: Secondary | ICD-10-CM

## 2023-02-04 DIAGNOSIS — Z885 Allergy status to narcotic agent status: Secondary | ICD-10-CM

## 2023-02-04 DIAGNOSIS — R5381 Other malaise: Secondary | ICD-10-CM | POA: Diagnosis present

## 2023-02-04 DIAGNOSIS — R404 Transient alteration of awareness: Secondary | ICD-10-CM | POA: Diagnosis not present

## 2023-02-04 DIAGNOSIS — Z743 Need for continuous supervision: Secondary | ICD-10-CM | POA: Diagnosis not present

## 2023-02-04 DIAGNOSIS — Z801 Family history of malignant neoplasm of trachea, bronchus and lung: Secondary | ICD-10-CM

## 2023-02-04 DIAGNOSIS — C9110 Chronic lymphocytic leukemia of B-cell type not having achieved remission: Secondary | ICD-10-CM | POA: Diagnosis present

## 2023-02-04 DIAGNOSIS — R0689 Other abnormalities of breathing: Secondary | ICD-10-CM | POA: Diagnosis not present

## 2023-02-04 DIAGNOSIS — G40901 Epilepsy, unspecified, not intractable, with status epilepticus: Principal | ICD-10-CM | POA: Diagnosis present

## 2023-02-04 DIAGNOSIS — R0902 Hypoxemia: Secondary | ICD-10-CM | POA: Diagnosis not present

## 2023-02-04 DIAGNOSIS — I700 Atherosclerosis of aorta: Secondary | ICD-10-CM | POA: Diagnosis not present

## 2023-02-04 DIAGNOSIS — C9111 Chronic lymphocytic leukemia of B-cell type in remission: Secondary | ICD-10-CM | POA: Diagnosis present

## 2023-02-04 DIAGNOSIS — R4182 Altered mental status, unspecified: Secondary | ICD-10-CM | POA: Diagnosis not present

## 2023-02-04 DIAGNOSIS — R29818 Other symptoms and signs involving the nervous system: Secondary | ICD-10-CM | POA: Diagnosis not present

## 2023-02-04 DIAGNOSIS — Z823 Family history of stroke: Secondary | ICD-10-CM

## 2023-02-04 DIAGNOSIS — R4701 Aphasia: Secondary | ICD-10-CM | POA: Diagnosis not present

## 2023-02-04 DIAGNOSIS — D62 Acute posthemorrhagic anemia: Secondary | ICD-10-CM | POA: Diagnosis not present

## 2023-02-04 DIAGNOSIS — Z8673 Personal history of transient ischemic attack (TIA), and cerebral infarction without residual deficits: Secondary | ICD-10-CM

## 2023-02-04 DIAGNOSIS — H401110 Primary open-angle glaucoma, right eye, stage unspecified: Secondary | ICD-10-CM | POA: Diagnosis not present

## 2023-02-04 DIAGNOSIS — Z6822 Body mass index (BMI) 22.0-22.9, adult: Secondary | ICD-10-CM

## 2023-02-04 DIAGNOSIS — D696 Thrombocytopenia, unspecified: Secondary | ICD-10-CM | POA: Diagnosis present

## 2023-02-04 DIAGNOSIS — F05 Delirium due to known physiological condition: Secondary | ICD-10-CM | POA: Diagnosis not present

## 2023-02-04 DIAGNOSIS — R443 Hallucinations, unspecified: Secondary | ICD-10-CM | POA: Diagnosis not present

## 2023-02-04 DIAGNOSIS — I619 Nontraumatic intracerebral hemorrhage, unspecified: Secondary | ICD-10-CM | POA: Diagnosis present

## 2023-02-04 DIAGNOSIS — Z888 Allergy status to other drugs, medicaments and biological substances status: Secondary | ICD-10-CM

## 2023-02-04 DIAGNOSIS — F39 Unspecified mood [affective] disorder: Secondary | ICD-10-CM | POA: Diagnosis present

## 2023-02-04 DIAGNOSIS — M810 Age-related osteoporosis without current pathological fracture: Secondary | ICD-10-CM | POA: Diagnosis present

## 2023-02-04 DIAGNOSIS — G4489 Other headache syndrome: Secondary | ICD-10-CM | POA: Diagnosis not present

## 2023-02-04 DIAGNOSIS — X58XXXA Exposure to other specified factors, initial encounter: Secondary | ICD-10-CM | POA: Diagnosis not present

## 2023-02-04 DIAGNOSIS — E785 Hyperlipidemia, unspecified: Secondary | ICD-10-CM | POA: Diagnosis present

## 2023-02-04 DIAGNOSIS — E440 Moderate protein-calorie malnutrition: Secondary | ICD-10-CM | POA: Insufficient documentation

## 2023-02-04 DIAGNOSIS — D649 Anemia, unspecified: Secondary | ICD-10-CM | POA: Diagnosis present

## 2023-02-04 DIAGNOSIS — R32 Unspecified urinary incontinence: Secondary | ICD-10-CM | POA: Diagnosis not present

## 2023-02-04 DIAGNOSIS — Z7983 Long term (current) use of bisphosphonates: Secondary | ICD-10-CM

## 2023-02-04 DIAGNOSIS — N390 Urinary tract infection, site not specified: Secondary | ICD-10-CM | POA: Diagnosis not present

## 2023-02-04 DIAGNOSIS — K661 Hemoperitoneum: Secondary | ICD-10-CM | POA: Diagnosis not present

## 2023-02-04 DIAGNOSIS — S301XXA Contusion of abdominal wall, initial encounter: Secondary | ICD-10-CM

## 2023-02-04 LAB — PROTIME-INR
INR: 1 (ref 0.8–1.2)
Prothrombin Time: 12.9 seconds (ref 11.4–15.2)

## 2023-02-04 LAB — I-STAT CHEM 8, ED
BUN: 17 mg/dL (ref 8–23)
Calcium, Ion: 1.18 mmol/L (ref 1.15–1.40)
Chloride: 104 mmol/L (ref 98–111)
Creatinine, Ser: 1 mg/dL (ref 0.44–1.00)
Glucose, Bld: 155 mg/dL — ABNORMAL HIGH (ref 70–99)
HCT: 43 % (ref 36.0–46.0)
Hemoglobin: 14.6 g/dL (ref 12.0–15.0)
Potassium: 3.7 mmol/L (ref 3.5–5.1)
Sodium: 140 mmol/L (ref 135–145)
TCO2: 30 mmol/L (ref 22–32)

## 2023-02-04 LAB — DIFFERENTIAL
Abs Immature Granulocytes: 0.03 10*3/uL (ref 0.00–0.07)
Basophils Absolute: 0 10*3/uL (ref 0.0–0.1)
Basophils Relative: 0 %
Eosinophils Absolute: 0.2 10*3/uL (ref 0.0–0.5)
Eosinophils Relative: 3 %
Immature Granulocytes: 0 %
Lymphocytes Relative: 53 %
Lymphs Abs: 4.3 10*3/uL — ABNORMAL HIGH (ref 0.7–4.0)
Monocytes Absolute: 0.6 10*3/uL (ref 0.1–1.0)
Monocytes Relative: 7 %
Neutro Abs: 3.1 10*3/uL (ref 1.7–7.7)
Neutrophils Relative %: 37 %

## 2023-02-04 LAB — COMPREHENSIVE METABOLIC PANEL
ALT: 14 U/L (ref 0–44)
AST: 28 U/L (ref 15–41)
Albumin: 4.2 g/dL (ref 3.5–5.0)
Alkaline Phosphatase: 53 U/L (ref 38–126)
Anion gap: 9 (ref 5–15)
BUN: 15 mg/dL (ref 8–23)
CO2: 26 mmol/L (ref 22–32)
Calcium: 9.5 mg/dL (ref 8.9–10.3)
Chloride: 104 mmol/L (ref 98–111)
Creatinine, Ser: 0.99 mg/dL (ref 0.44–1.00)
GFR, Estimated: 57 mL/min — ABNORMAL LOW (ref >60)
Glucose, Bld: 155 mg/dL — ABNORMAL HIGH (ref 70–99)
Potassium: 3.5 mmol/L (ref 3.5–5.1)
Sodium: 139 mmol/L (ref 135–145)
Total Bilirubin: 0.5 mg/dL (ref 0.3–1.2)
Total Protein: 6.8 g/dL (ref 6.5–8.1)

## 2023-02-04 LAB — CBC
HCT: 43.6 % (ref 36.0–46.0)
Hemoglobin: 14.6 g/dL (ref 12.0–15.0)
MCH: 31.1 pg (ref 26.0–34.0)
MCHC: 33.5 g/dL (ref 30.0–36.0)
MCV: 93 fL (ref 80.0–100.0)
Platelets: 116 10*3/uL — ABNORMAL LOW (ref 150–400)
RBC: 4.69 MIL/uL (ref 3.87–5.11)
RDW: 12.9 % (ref 11.5–15.5)
WBC: 8.2 10*3/uL (ref 4.0–10.5)
nRBC: 0 % (ref 0.0–0.2)

## 2023-02-04 LAB — ETHANOL: Alcohol, Ethyl (B): <10 mg/dL (ref <10)

## 2023-02-04 LAB — APTT: aPTT: 25 seconds (ref 24–36)

## 2023-02-04 LAB — CBG MONITORING, ED: Glucose-Capillary: 148 mg/dL — ABNORMAL HIGH (ref 70–99)

## 2023-02-04 MED ORDER — ONDANSETRON HCL 4 MG PO TABS: 4.0000 mg | ORAL_TABLET | Freq: Four times a day (QID) | ORAL | Status: DC | PRN

## 2023-02-04 MED ORDER — LEVETIRACETAM IN NACL 500 MG/100ML IV SOLN
500.0000 mg | Freq: Two times a day (BID) | INTRAVENOUS | Status: DC
  Administered 2023-02-05: 500 mg via INTRAVENOUS
  Filled 2023-02-04: qty 100

## 2023-02-04 MED ORDER — ONDANSETRON HCL 4 MG/2ML IJ SOLN: 4.0000 mg | Freq: Four times a day (QID) | INTRAMUSCULAR | Status: DC | PRN

## 2023-02-04 MED ORDER — SORBITOL 70 % SOLN: 30.0000 mL | Freq: Every day | Status: DC | PRN

## 2023-02-04 MED ORDER — CHLORHEXIDINE GLUCONATE 0.12% ORAL RINSE (MEDLINE KIT)
15.0000 mL | Freq: Two times a day (BID) | OROMUCOSAL | Status: DC
  Administered 2023-02-05 – 2023-02-27 (×33): 15 mL via OROMUCOSAL
  Filled 2023-02-04 (×3): qty 15

## 2023-02-04 MED ORDER — POLYETHYLENE GLYCOL 3350 17 G PO PACK: 17.0000 g | PACK | Freq: Every day | ORAL | Status: DC | PRN

## 2023-02-04 MED ORDER — ACETAMINOPHEN 325 MG PO TABS
650.0000 mg | ORAL_TABLET | ORAL | Status: DC | PRN
  Administered 2023-02-11: 650 mg via ORAL
  Filled 2023-02-04: qty 2

## 2023-02-04 MED ORDER — SODIUM CHLORIDE 0.9 % IV SOLN: INTRAVENOUS | Status: DC

## 2023-02-04 MED ORDER — ENALAPRILAT 1.25 MG/ML IV SOLN
0.6250 mg | Freq: Four times a day (QID) | INTRAVENOUS | Status: DC
  Administered 2023-02-04 – 2023-02-05 (×3): 0.625 mg via INTRAVENOUS
  Filled 2023-02-04 (×4): qty 0.5

## 2023-02-04 MED ORDER — IOHEXOL 350 MG/ML SOLN
50.0000 mL | Freq: Once | INTRAVENOUS | Status: AC | PRN
  Administered 2023-02-04: 50 mL via INTRAVENOUS

## 2023-02-04 MED ORDER — PANTOPRAZOLE SODIUM 40 MG IV SOLR
40.0000 mg | Freq: Every day | INTRAVENOUS | Status: DC
  Administered 2023-02-04 – 2023-02-11 (×8): 40 mg via INTRAVENOUS
  Filled 2023-02-04 (×8): qty 10

## 2023-02-04 MED ORDER — SODIUM CHLORIDE 0.9% FLUSH
3.0000 mL | Freq: Two times a day (BID) | INTRAVENOUS | Status: DC
  Administered 2023-02-04 – 2023-02-27 (×46): 3 mL via INTRAVENOUS

## 2023-02-04 MED ORDER — LORAZEPAM 2 MG/ML IJ SOLN
4.0000 mg | INTRAMUSCULAR | Status: DC | PRN
  Administered 2023-02-04: 4 mg via INTRAVENOUS
  Filled 2023-02-04: qty 2

## 2023-02-04 MED ORDER — LATANOPROST 0.005 % OP SOLN
1.0000 [drp] | Freq: Every day | OPHTHALMIC | Status: DC
  Administered 2023-02-06 – 2023-02-27 (×22): 1 [drp] via OPHTHALMIC
  Filled 2023-02-04: qty 2.5

## 2023-02-04 MED ORDER — ORAL CARE MOUTH RINSE
15.0000 mL | OROMUCOSAL | Status: DC
  Administered 2023-02-05 – 2023-02-18 (×93): 15 mL via OROMUCOSAL

## 2023-02-04 MED ORDER — LEVETIRACETAM IN NACL 1000 MG/100ML IV SOLN
1000.0000 mg | Freq: Once | INTRAVENOUS | Status: AC
  Administered 2023-02-04: 1000 mg via INTRAVENOUS
  Filled 2023-02-04: qty 100

## 2023-02-04 MED ORDER — HYDRALAZINE HCL 20 MG/ML IJ SOLN: 5.0000 mg | Freq: Four times a day (QID) | INTRAMUSCULAR | Status: DC | PRN

## 2023-02-04 MED ORDER — ACETAMINOPHEN 650 MG RE SUPP: 650.0000 mg | RECTAL | Status: DC | PRN

## 2023-02-04 MED ORDER — DORZOLAMIDE HCL-TIMOLOL MAL 2-0.5 % OP SOLN
1.0000 [drp] | Freq: Every day | OPHTHALMIC | Status: DC
  Administered 2023-02-04 – 2023-02-27 (×23): 1 [drp] via OPHTHALMIC
  Filled 2023-02-04: qty 10

## 2023-02-04 NOTE — Code Documentation (Signed)
Stroke Response Nurse Documentation Code Documentation  Selena Branch is a 82 y.o. female arriving to [facility]  via [facility] on 02/04/2023 with past medical hx of CLL, ITP, ICH in 2021. On No antithrombotic. Code stroke was activated by EMS.   Patient from home where she was LKW at 1400 and now complaining of right sided weakness, nonverbal, dazed . Per EMS, husband noticed patient was nonverbal and had right sided weakness. He called their daughter to come and check her out and she called EMS. While en route, patient kept alternating hands and holding her but was nonverbal and not following commands.    Stroke team at the bedside on patient arrival. Labs drawn and patient cleared for CT by Dr. Theresia Lo. Patient to CT with team. NIHSS 21, see documentation for details and code stroke times. Patient with disoriented, not following commands, bilateral arm weakness, bilateral leg weakness, Global aphasia , and dysarthria  on exam. The following imaging was completed:  CT Head, CTA, and MRI. Patient is not a candidate for IV Thrombolytic due to prior hemorrhage. Patient is not a candidate for IR due to no LVO noted on imaging per MD.   Code Stroke Canceled.   Bedside handoff with ED RN Kandee Keen.    Felecia Jan  Stroke Response RN

## 2023-02-04 NOTE — ED Notes (Signed)
ED TO INPATIENT HANDOFF REPORT  ED Nurse Name and Phone #: cori [phone]  S Name/Age/Gender De Burrs 82 y.o. female Room/Bed: 030C/030C  Code Status   Code Status: DNR  Home/SNF/Other Home Patient oriented to: self Is this baseline? No   Triage Complete: Triage complete  Chief Complaint Aphasia [R47.01]  Triage Note No notes on file   Allergies Allergies  Allergen Reactions   Codeine Nausea Only   Evista [Raloxifene]     Other reaction(s): leg cramps   Alendronate     Other reaction(s): GI Upset (intolerance)   Ibandronic Acid Other (See Comments)    Other reaction(s): gi upset, GI Upset (intolerance)   Risedronate     Other reaction(s): GI Upset (intolerance)    Level of Care/Admitting Diagnosis ED Disposition     ED Disposition  Admit   Condition  --   Comment  Hospital Area: [facility] [place] [facility] [[id number]]  Level of Care: Telemetry Cardiac [[id number]]  May place patient in observation at [facility] or [facility] if equivalent level of care is available:: No  Covid Evaluation: Asymptomatic - no recent exposure (last 10 days) testing not required  Diagnosis: Aphasia [784.3.ICD-9-CM]  Admitting Physician: Rodolph Bong [[id number]]  Attending Physician: Rodolph Bong [[id number]]  Bed request comments: stroke floor please          B Medical/Surgery History Past Medical History:  Diagnosis Date   Anxiety    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 No pertinent surgical history.   A IV Location/Drains/Wounds Patient Lines/Drains/Airways Status     Active Line/Drains/Airways     Name Placement date Placement time Site Days   Implanted Port 05/02/20 05/02/20  --  --  1008   Peripheral IV 02/04/23 18 G 1.16" Right Antecubital 02/04/23  1500  Antecubital  less than 1            Intake/Output Last 24 hours No intake or output data in the 24 hours  ending 02/04/23 1753  Labs/Imaging Results for orders placed or performed during the hospital encounter of 02/04/23 (from the past 48 hour(s))  Ethanol     Status: None   Collection Time: 02/04/23  3:01 PM  Result Value Ref Range   Alcohol, Ethyl (B) <10 <10 mg/dL    Comment: (NOTE) Lowest detectable limit for serum alcohol is 10 mg/dL.  For medical purposes only. Performed at [facility], [address], [place], [place] [address]   Protime-INR     Status: None   Collection Time: 02/04/23  3:01 PM  Result Value Ref Range   Prothrombin Time 12.9 11.4 - 15.2 seconds   INR 1.0 0.8 - 1.2    Comment: (NOTE) INR goal varies based on device and disease states. Performed at [facility], [address] [place], [place] [address]   APTT     Status: None   Collection Time: 02/04/23  3:01 PM  Result Value Ref Range   aPTT 25 24 - 36 seconds    Comment: Performed at [facility], [address], [place], [place] [address]  CBC     Status: Abnormal   Collection Time: 02/04/23  3:01 PM  Result Value Ref Range   WBC 8.2 4.0 - 10.5 K/uL   RBC 4.69 3.87 - 5.11 MIL/uL   Hemoglobin 14.6 12.0 - 15.0  g/dL   HCT 11.9 14.7 - 82.9 %   MCV 93.0 80.0 - 100.0 fL   MCH 31.1 26.0 - 34.0 pg   MCHC 33.5 30.0 - 36.0 g/dL   RDW 56.2 13.0 - 86.5 %   Platelets 116 (L) 150 - 400 K/uL   nRBC 0.0 0.0 - 0.2 %    Comment: Performed at [facility], [address] [place], [place] [address]  Differential     Status: Abnormal   Collection Time: 02/04/23  3:01 PM  Result Value Ref Range   Neutrophils Relative % 37 %   Neutro Abs 3.1 1.7 - 7.7 K/uL   Lymphocytes Relative 53 %   Lymphs Abs 4.3 (H) 0.7 - 4.0 K/uL   Monocytes Relative 7 %   Monocytes Absolute 0.6 0.1 - 1.0 K/uL   Eosinophils Relative 3 %   Eosinophils Absolute 0.2 0.0 - 0.5 K/uL   Basophils Relative 0 %   Basophils Absolute 0.0 0.0 - 0.1 K/uL   Immature Granulocytes 0 %   Abs Immature Granulocytes 0.03 0.00 -  0.07 K/uL    Comment: Performed at [facility], [address], [place], [place] [address]  Comprehensive metabolic panel     Status: Abnormal   Collection Time: 02/04/23  3:01 PM  Result Value Ref Range   Sodium 139 135 - 145 mmol/L   Potassium 3.5 3.5 - 5.1 mmol/L   Chloride 104 98 - 111 mmol/L   CO2 26 22 - 32 mmol/L   Glucose, Bld 155 (H) 70 - 99 mg/dL    Comment: Glucose reference range applies only to samples taken after fasting for at least 8 hours.   BUN 15 8 - 23 mg/dL   Creatinine, Ser 8.41 0.44 - 1.00 mg/dL   Calcium 9.5 8.9 - 32.4 mg/dL   Total Protein 6.8 6.5 - 8.1 g/dL   Albumin 4.2 3.5 - 5.0 g/dL   AST 28 15 - 41 U/L   ALT 14 0 - 44 U/L   Alkaline Phosphatase 53 38 - 126 U/L   Total Bilirubin 0.5 0.3 - 1.2 mg/dL   GFR, Estimated 57 (L) >60 mL/min    Comment: (NOTE) Calculated using the CKD-EPI Creatinine Equation (2021)    Anion gap 9 5 - 15    Comment: Performed at [facility], [address], [place], [place] [address]  CBG monitoring, ED     Status: Abnormal   Collection Time: 02/04/23  3:02 PM  Result Value Ref Range   Glucose-Capillary 148 (H) 70 - 99 mg/dL    Comment: Glucose reference range applies only to samples taken after fasting for at least 8 hours.  I-stat chem 8, ED     Status: Abnormal   Collection Time: 02/04/23  3:08 PM  Result Value Ref Range   Sodium 140 135 - 145 mmol/L   Potassium 3.7 3.5 - 5.1 mmol/L   Chloride 104 98 - 111 mmol/L   BUN 17 8 - 23 mg/dL   Creatinine, Ser 7.25 0.44 - 1.00 mg/dL   Glucose, Bld 366 (H) 70 - 99 mg/dL    Comment: Glucose reference range applies only to samples taken after fasting for at least 8 hours.   Calcium, Ion 1.18 1.15 - 1.40 mmol/L   TCO2 30 22 - 32 mmol/L   Hemoglobin 14.6 12.0 - 15.0 g/dL   HCT 44.0 34.7 - 42.5 %   DG Chest [facility] 1 View  Result  Date: 02/04/2023 CLINICAL DATA:  Altered mental status EXAM: PORTABLE CHEST 1 VIEW COMPARISON:  CT 12/01/2020 FINDINGS: Low lung volumes.  Patchy retrocardiac opacity. No pleural effusion. Normal cardiac size. Aortic atherosclerosis IMPRESSION: Low lung volumes with patchy retrocardiac opacity, atelectasis versus pneumonia. Electronically Signed   By: Jasmine Pang M.D.   On: 02/04/2023 17:22   MR BRAIN WO CONTRAST  Result Date: 02/04/2023 CLINICAL DATA:  Neuro deficit, acute, stroke suspected. EXAM: MRI HEAD WITHOUT CONTRAST TECHNIQUE: Multiplanar, multiecho pulse sequences of the brain and surrounding structures were obtained without intravenous contrast. COMPARISON:  Head CT 02/04/2023 and older.  MRI brain 01/14/2016. FINDINGS: Brain: No acute infarct or hemorrhage. Sequela of prior hemorrhage in the left superior temporal gyrus. Old microhemorrhage in the posterior right temporal lobe. No hydrocephalus or extra-axial collection. No mass or midline shift. Vascular: Normal flow voids. Skull and upper cervical spine: Normal marrow signal. Sinuses/Orbits: Unremarkable. Other: None. IMPRESSION: No acute intracranial process. Electronically Signed   By: Orvan Falconer M.D.   On: 02/04/2023 16:10   CT ANGIO HEAD NECK W WO CM (CODE STROKE)  Result Date: 02/04/2023 CLINICAL DATA:  Neuro deficit, acute, stroke suspected right sided weakness, aphasia. EXAM: CT ANGIOGRAPHY HEAD AND NECK WITH AND WITHOUT CONTRAST TECHNIQUE: Multidetector CT imaging of the head and neck was performed using the standard protocol during bolus administration of intravenous contrast. Multiplanar CT image reconstructions and MIPs were obtained to evaluate the vascular anatomy. Carotid stenosis measurements (when applicable) are obtained utilizing NASCET criteria, using the distal internal carotid diameter as the denominator. RADIATION DOSE REDUCTION: This exam was performed according to the departmental dose-optimization program which includes automated exposure control, adjustment of the mA and/or kV according to patient size and/or use of iterative reconstruction  technique. CONTRAST:  50mL OMNIPAQUE IOHEXOL 350 MG/ML SOLN COMPARISON:  Head CT 02/04/2023.  MRI brain 01/14/2016. FINDINGS: CTA NECK FINDINGS Aortic arch: Three-vessel arch configuration. Atherosclerotic calcifications of the aortic arch and arch vessel origins. Arch vessel origins are patent. Right carotid system: No evidence of dissection, stenosis (50% or greater), or occlusion. Left carotid system: No evidence of dissection, stenosis (50% or greater), or occlusion. Vertebral arteries: Codominant. No evidence of dissection, stenosis (50% or greater), or occlusion. 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 Other neck: Unremarkable. Upper chest: Unremarkable. Review of the MIP images confirms the above findings CTA HEAD FINDINGS Anterior circulation: Intracranial ICAs are patent without stenosis or aneurysm. The proximal ACAs and MCAs are patent without stenosis or aneurysm. Distal branches are symmetric. Posterior circulation: Normal basilar artery. The SCAs, AICAs and PICAs are patent proximally. The PCAs are patent proximally without stenosis or aneurysm. Distal branches are symmetric. Venous sinuses: Patent. 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 Review of the MIP images confirms the above findings IMPRESSION: No large vessel occlusion, hemodynamically significant stenosis or aneurysm of the head or neck vessels. Aortic Atherosclerosis (ICD10-I70.0). Electronically Signed   By: Orvan Falconer M.D.   On: 02/04/2023 15:42   CT HEAD CODE STROKE WO CONTRAST  Result Date: 02/04/2023 CLINICAL DATA:  Code stroke. Neuro deficit, acute, stroke suspected no lateralizing information provided. EXAM: CT HEAD WITHOUT CONTRAST TECHNIQUE: Contiguous axial images were obtained from the base of the skull through the vertex without intravenous contrast. RADIATION DOSE REDUCTION: This exam was performed  according to the departmental dose-optimization program which includes automated exposure control, adjustment of the mA and/or  kV according to patient size and/or use of iterative reconstruction technique. COMPARISON:  CT Head 11/14/19 FINDINGS: Brain: No evidence of acute infarction, hemorrhage, hydrocephalus, extra-axial collection or mass lesion/mass effect. Sequela of moderate chronic microvascular ischemic change. Unchanged small cystic lesion in the left temporal lobe (series 2, image 17), previously favored to represent a chronic evolving hemorrhage. Vascular: No hyperdense vessel or unexpected calcification. Skull: Normal. Negative for fracture or focal lesion. Sinuses/Orbits: No middle ear or mastoid effusion. Paranasal sinuses are clear. Orbits are notable for bilateral lens replacement, otherwise unremarkable. Other: None. ASPECTS [facility] Stroke Program Early CT Score):: 10 IMPRESSION: No acute hemorrhage or CT evidence of an acute cortical infarct. Findings were paged to Dr. Pearlean Brownie on 02/04/23 at 3:17 PM via [facility] paging system. Electronically Signed   By: Lorenza [place] M.D.   On: 02/04/2023 15:17    Pending Labs Unresulted Labs (From admission, onward)     Start     Ordered   02/05/23 0500  Magnesium  Tomorrow morning,   R        02/04/23 1733   02/05/23 0500  CBC with Differential/Platelet  Tomorrow morning,   R        02/04/23 1733   02/05/23 0500  Comprehensive metabolic panel  Tomorrow morning,   R        02/04/23 1733   02/04/23 1723  Urinalysis, Routine w reflex microscopic -Urine, Catheterized  Once,   R       Question Answer Comment  Specimen Source Urine, Catheterized   Release to patient Immediate      02/04/23 1722   02/04/23 1501  Urine rapid drug screen (hosp performed)  Once,   STAT        02/04/23 1501            Vitals/Pain Today's Vitals   02/04/23 1630 02/04/23 1700 02/04/23 1730 02/04/23 1745  BP: (!) 149/74 (!) 157/90 (!) 149/59 136/61  Pulse: 74 75 71 67   Resp: 16 18 16 18   SpO2: 100% 99% 100% 100%  Weight:        Isolation Precautions No active isolations  Medications Medications  levETIRAcetam (KEPPRA) IVPB 500 mg/100 mL premix (has no administration in time range)  dorzolamide-timolol (COSOPT) 2-0.5 % ophthalmic solution 1 drop (has no administration in time range)  latanoprost (XALATAN) 0.005 % ophthalmic solution 1 drop (has no administration in time range)  chlorhexidine gluconate (MEDLINE KIT) (PERIDEX) 0.12 % solution 15 mL (has no administration in time range)  Oral care mouth rinse (has no administration in time range)  LORazepam (ATIVAN) injection 4 mg (has no administration in time range)  acetaminophen (TYLENOL) tablet 650 mg (has no administration in time range)    Or  acetaminophen (TYLENOL) suppository 650 mg (has no administration in time range)  ondansetron (ZOFRAN) tablet 4 mg (has no administration in time range)    Or  ondansetron (ZOFRAN) injection 4 mg (has no administration in time range)  pantoprazole (PROTONIX) injection 40 mg (has no administration in time range)  sodium chloride flush (NS) 0.9 % injection 3 mL (has no administration in time range)  0.9 %  sodium chloride infusion (has no administration in time range)  polyethylene glycol (MIRALAX / GLYCOLAX) packet 17 g (has no administration in time range)  sorbitol 70 % solution 30 mL (has no administration in time range)  hydrALAZINE (APRESOLINE) injection 5 mg (has no administration in time range)  enalaprilat (VASOTEC) injection 0.625 mg (has no administration  in time range)  iohexol (OMNIPAQUE) 350 MG/ML injection 50 mL (50 mLs Intravenous Contrast Given 02/04/23 1534)  levETIRAcetam (KEPPRA) IVPB 1000 mg/100 mL premix (1,000 mg Intravenous New Bag/Given 02/04/23 1716)    Mobility non-ambulatory     Focused Assessments Neuro Assessment Handoff:  Swallow screen pass? No    NIH Stroke Scale  Interval: Initial Level of Consciousness (1a.)   :  Alert, keenly responsive LOC Questions (1b. )   : Answers neither question correctly LOC Commands (1c. )   : Performs neither task correctly Best Gaze (2. )  : Normal Visual (3. )  : No visual loss Facial Palsy (4. )    : Normal symmetrical movements Motor Arm, Left (5a. )   : No effort against gravity Motor Arm, Right (5b. ) : No effort against gravity Motor Leg, Left (6a. )  : No effort against gravity Motor Leg, Right (6b. ) : No effort against gravity Limb Ataxia (7. ): Absent Sensory (8. )  : Normal, no sensory loss Best Language (9. )  : Mute, global aphasia Dysarthria (10. ): Severe dysarthria, patient's speech is so slurred as to be unintelligible in the absence of or out of proportion to any dysphasia, or is mute/anarthric Extinction/Inattention (11.)   : No Abnormality Complete NIHSS TOTAL: 21 Last date known well: 02/04/23 Last time known well: 1400 Neuro Assessment:   Neuro Checks:   Initial (02/04/23 1500)  Has TPA been given? No If patient is a Neuro Trauma and patient is going to OR before floor call report to 4N Charge nurse: [phone] or [phone]   R Recommendations: See Admitting Provider Note  Report given to:   Additional Notes: code stroke cancelled following the MRI, now being worked up for a suspected seizure. Patient is verbal and independent at baseline. Currently alert, oriented x 0,  not following commands.

## 2023-02-04 NOTE — ED Provider Notes (Signed)
[place] EMERGENCY DEPARTMENT AT [facility] Provider Note   CSN: [id number] Arrival date & time: 02/04/23  1459  An emergency department physician performed an initial assessment on this suspected stroke patient at 1501.  History  Chief Complaint  Patient presents with   Code Stroke    Selena Branch is a 82 y.o. female.  Patient is an 82 year old female with a past medical history of CLL, hypertension, thrombocytopenia presented to the emergency department with altered mental status.  Per patient's family she was talking on the phone shortly prior to arrival and when her husband came in from check in the mail she was sitting on the couch with her robe unzipped and was unable to speak.  He did not witness any shaking or seizure-like activity.  Patient had prehospital arrival code stroke called due to her aphasia.  Family states at baseline she is ANO x 3 and ambulatory and is able to care for herself and her husband with dementia.  The history is provided by the spouse and a relative. The history is limited by the condition of the patient.       Home Medications Prior to Admission medications   Medication Sig Start Date End Date Taking? Authorizing Provider  amLODipine (NORVASC) 10 MG tablet Take 1 tablet (10 mg total) by mouth at bedtime. 09/20/19   Mikhail, Nita Sells, DO  atorvastatin (LIPITOR) 40 MG tablet Take 40 mg by mouth daily.     [provider]  Brinzolamide-Brimonidine (SIMBRINZA) 1-0.2 % SUSP Place 1 drop into both eyes daily at 12 noon. Patient not taking: Reported on 01/06/2021    [provider]  Calcium Carbonate-Vitamin D (CALCIUM-D PO) Take 600 mg by mouth daily.    [provider]  Cholecalciferol (VITAMIN D3) 2000 units TABS Take 2,000 Units by mouth daily.     [provider]  dorzolamide-timolol (COSOPT) 22.3-6.8 MG/ML ophthalmic solution Place 1 drop into both eyes daily.  12/10/15   [provider]   eltrombopag (PROMACTA) 25 MG tablet Take 1 tablet (25 mg) on Monday Wednesday and Friday .take on an empty stomach, 1 hour before a meal or 2 hours after. 01/14/23   Johney Maine, MD  furosemide (LASIX) 20 MG tablet Take 40 mg for 3 days then resume your 20 mg daily Patient taking differently: Take 20 mg by mouth daily. 09/01/19   Joseph Art, DO  ibandronate (BONIVA) 150 MG tablet Take 150 mg by mouth every morning. 10/26/21   [provider]  latanoprost (XALATAN) 0.005 % ophthalmic solution Place 1 drop into both eyes at bedtime.  12/10/15   [provider]  lisinopril (ZESTRIL) 40 MG tablet Take 1 tablet (40 mg total) by mouth daily. 07/30/22   Johney Maine, MD  Multiple Vitamin (MULTIVITAMIN WITH MINERALS) TABS tablet Take 1 tablet by mouth daily.    [provider]  sertraline (ZOLOFT) 25 MG tablet Take 25 mg by mouth daily. 11/22/20   [provider]      Allergies    Codeine, Evista [raloxifene], Alendronate, Ibandronic acid, and Risedronate    Review of Systems   Review of Systems  Physical Exam Updated Vital Signs BP (!) 145/63   Pulse 71   Resp 18   Wt 49.2 kg   SpO2 93%   BMI 22.67 kg/m  Physical Exam Vitals and nursing note reviewed.  Constitutional:      General: She is not in acute distress.  Appearance: Normal appearance.  HENT:     Head: Normocephalic and atraumatic.     Nose: Nose normal.     Mouth/Throat:     Mouth: Mucous membranes are moist.     Pharynx: Oropharynx is clear.  Eyes:     Extraocular Movements: Extraocular movements intact.     Conjunctiva/sclera: Conjunctivae normal.     Pupils: Pupils are equal, round, and reactive to light.  Cardiovascular:     Rate and Rhythm: Normal rate and regular rhythm.     Heart sounds: Normal heart sounds.  Pulmonary:     Effort: Pulmonary effort is normal.     Breath sounds: Normal breath sounds.  Abdominal:     General: Abdomen is flat.   Musculoskeletal:        General: Normal range of motion.     Cervical back: Normal range of motion.  Skin:    General: Skin is warm and dry.  Neurological:     Mental Status: She is alert.     Comments: Awake and alert, aphasic, no obvious facial droop, spontaneously moving all 4 extremities     ED Results / Procedures / Treatments   Labs (all labs ordered are listed, but only abnormal results are displayed) Labs Reviewed  CBC - Abnormal; Notable for the following components:      Result Value   Platelets 116 (*)    All other components within normal limits  DIFFERENTIAL - Abnormal; Notable for the following components:   Lymphs Abs 4.3 (*)    All other components within normal limits  COMPREHENSIVE METABOLIC PANEL - Abnormal; Notable for the following components:   Glucose, Bld 155 (*)    GFR, Estimated 57 (*)    All other components within normal limits  I-STAT CHEM 8, ED - Abnormal; Notable for the following components:   Glucose, Bld 155 (*)    All other components within normal limits  CBG MONITORING, ED - Abnormal; Notable for the following components:   Glucose-Capillary 148 (*)    All other components within normal limits  ETHANOL  PROTIME-INR  APTT  RAPID URINE DRUG SCREEN, HOSP PERFORMED  URINALYSIS, ROUTINE W REFLEX MICROSCOPIC    EKG EKG Interpretation  Date/Time:  Friday Feb 04 2023 16:12:49 EDT Ventricular Rate:  76 PR Interval:  181 QRS Duration: 97 QT Interval:  404 QTC Calculation: 455 R Axis:   36 Text Interpretation: Sinus rhythm Left atrial enlargement RSR' in V1 or V2, probably normal variant No significant change since last tracing Confirmed by Elayne Snare ([id number]) on 02/04/2023 4:19:00 PM  Radiology MR BRAIN WO CONTRAST  Result Date: 02/04/2023 CLINICAL DATA:  Neuro deficit, acute, stroke suspected. EXAM: MRI HEAD WITHOUT CONTRAST TECHNIQUE: Multiplanar, multiecho pulse sequences of the brain and surrounding structures were obtained  without intravenous contrast. COMPARISON:  Head CT 02/04/2023 and older.  MRI brain 01/14/2016. FINDINGS: Brain: No acute infarct or hemorrhage. Sequela of prior hemorrhage in the left superior temporal gyrus. Old microhemorrhage in the posterior right temporal lobe. No hydrocephalus or extra-axial collection. No mass or midline shift. Vascular: Normal flow voids. Skull and upper cervical spine: Normal marrow signal. Sinuses/Orbits: Unremarkable. Other: None. IMPRESSION: No acute intracranial process. Electronically Signed   By: Orvan Falconer M.D.   On: 02/04/2023 16:10   CT ANGIO HEAD NECK W WO CM (CODE STROKE)  Result Date: 02/04/2023 CLINICAL DATA:  Neuro deficit, acute, stroke suspected right sided weakness, aphasia. EXAM: CT ANGIOGRAPHY HEAD AND NECK WITH AND WITHOUT  CONTRAST TECHNIQUE: Multidetector CT imaging of the head and neck was performed using the standard protocol during bolus administration of intravenous contrast. Multiplanar CT image reconstructions and MIPs were obtained to evaluate the vascular anatomy. Carotid stenosis measurements (when applicable) are obtained utilizing NASCET criteria, using the distal internal carotid diameter as the denominator. RADIATION DOSE REDUCTION: This exam was performed according to the departmental dose-optimization program which includes automated exposure control, adjustment of the mA and/or kV according to patient size and/or use of iterative reconstruction technique. CONTRAST:  50mL OMNIPAQUE IOHEXOL 350 MG/ML SOLN COMPARISON:  Head CT 02/04/2023.  MRI brain 01/14/2016. FINDINGS: CTA NECK FINDINGS Aortic arch: Three-vessel arch configuration. Atherosclerotic calcifications of the aortic arch and arch vessel origins. Arch vessel origins are patent. Right carotid system: No evidence of dissection, stenosis (50% or greater), or occlusion. Left carotid system: No evidence of dissection, stenosis (50% or greater), or occlusion. Vertebral arteries: Codominant.  No evidence of dissection, stenosis (50% or greater), or occlusion. 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 Other neck: Unremarkable. Upper chest: Unremarkable. Review of the MIP images confirms the above findings CTA HEAD FINDINGS Anterior circulation: Intracranial ICAs are patent without stenosis or aneurysm. The proximal ACAs and MCAs are patent without stenosis or aneurysm. Distal branches are symmetric. Posterior circulation: Normal basilar artery. The SCAs, AICAs and PICAs are patent proximally. The PCAs are patent proximally without stenosis or aneurysm. Distal branches are symmetric. Venous sinuses: Patent. 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 Review of the MIP images confirms the above findings IMPRESSION: No large vessel occlusion, hemodynamically significant stenosis or aneurysm of the head or neck vessels. Aortic Atherosclerosis (ICD10-I70.0). Electronically Signed   By: Orvan Falconer M.D.   On: 02/04/2023 15:42   CT HEAD CODE STROKE WO CONTRAST  Result Date: 02/04/2023 CLINICAL DATA:  Code stroke. Neuro deficit, acute, stroke suspected no lateralizing information provided. EXAM: CT HEAD WITHOUT CONTRAST TECHNIQUE: Contiguous axial images were obtained from the base of the skull through the vertex without intravenous contrast. RADIATION DOSE REDUCTION: This exam was performed according to the departmental dose-optimization program which includes automated exposure control, adjustment of the mA and/or kV according to patient size and/or use of iterative reconstruction technique. COMPARISON:  CT Head 11/14/19 FINDINGS: Brain: No evidence of acute infarction, hemorrhage, hydrocephalus, extra-axial collection or mass lesion/mass effect. Sequela of moderate chronic microvascular ischemic change. Unchanged small cystic lesion in the left temporal lobe (series 2, image 17),  previously favored to represent a chronic evolving hemorrhage. Vascular: No hyperdense vessel or unexpected calcification. Skull: Normal. Negative for fracture or focal lesion. Sinuses/Orbits: No middle ear or mastoid effusion. Paranasal sinuses are clear. Orbits are notable for bilateral lens replacement, otherwise unremarkable. Other: None. ASPECTS [facility] Stroke Program Early CT Score):: 10 IMPRESSION: No acute hemorrhage or CT evidence of an acute cortical infarct. Findings were paged to Dr. Pearlean Brownie on 02/04/23 at 3:17 PM via [facility] paging system. Electronically Signed   By: Lorenza Cambridge M.D.   On: 02/04/2023 15:17    Procedures Procedures    Medications Ordered in ED Medications  levETIRAcetam (KEPPRA) IVPB 1000 mg/100 mL premix (has no administration in time range)  levETIRAcetam (KEPPRA) IVPB 500 mg/100 mL premix (has no administration in time range)  iohexol (OMNIPAQUE) 350 MG/ML injection 50 mL (50 mLs Intravenous Contrast Given 02/04/23 1534)    ED Course/ Medical Decision Making/ A&P Clinical Course as of  02/04/23 1639  Fri Feb 04, 2023  1555 Advance directive at bedside. Patient is DNR but is ok with intubation if necessary. [VK]  1555 MRI negative. She is being placed on EEG and recommended hospitalist admission by neurology. 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 The complaint involves an extensive differential diagnosis and also carries with it a high risk of complications and morbidity.    The differential diagnosis includes CVA, TIA, seizure, hypo or hyperglycemia, electrolyte abnormality, infectious etiology, ICH, mass effect  Additional history  obtained: Additional history obtained from family Records reviewed patient oncology records  ED Course and Reassessment: Patient was made a prehospital arrival stroke alert for her aphasia and was immediately at by neurology on arrival.  Patient's Accu-Chek on arrival was normal and she was hemodynamically stable and transported to CT scan.  CT did not show any evidence of obvious hemorrhage or LVO.  Due to patient's history of thrombocytopenia and prior hemorrhagic stroke, she was determined not to be a tPA candidate.  Neurology recommended MRI and EEG for further stroke workup as well as admission for further evaluation of her altered mental status.  Independent labs interpretation:  The following labs were independently interpreted: within normal range  Independent visualization of imaging: - I independently visualized the following imaging with scope of interpretation limited to determining acute life threatening conditions related to emergency care: CTH/MRI brain, which revealed no acute abnormality  Consultation: - Consulted or discussed management/test interpretation w/ external professional: hospitalist, neurology  Consideration for admission or further workup: patient requires admission for further work up of her aphasia/AMS Social Determinants of health: N/A    Amount and/or Complexity of Data Reviewed Labs: ordered. Radiology: ordered.  Risk Decision regarding hospitalization.          Final Clinical Impression(s) / ED Diagnoses Final diagnoses:  Aphasia    Rx / DC Orders ED Discharge Orders     None         Rexford Maus, DO 02/04/23 1639

## 2023-02-04 NOTE — Consult Note (Signed)
Neurology Consultation  Reason for Consult: Code Stroke Referring Physician: Dr. Theresia Lo  CC: weakness of right arm(s), right hand(s), right upper leg(s), or right lower leg(s), inability to speak  History is obtained from: Family, EMS and chart  HPI: Selena Branch is a 82 y.o. female with a past medical history of CLL, ITP, left parietal ICH in 2021 presenting with aphasia and generalized weakness.  Patient was last known to be normal at 1400 today, when her husband went out to get the mail.  When he returned, she was laying on the couch unable to speak with weakness that appeared greater on right than left.  No abnormal movements were noted at this time.  Patient's husband called their daughter who came to look at patient and then called EMS.  While en route to the hospital, patient was holding her head with alternating hands but was unable to speak or follow commands.  Patient has no history of seizures but does have a history of ICH in 2021.  She has no personal or family history of seizures, no history of brain surgery, no history of meningitis or encephalitis and history of 1 fall in which she hit her head and had to have stitches but was not admitted to the hospital and no other serious head injuries. Code stroke CT head personally reviewed shows no acute abnormality. Stat MRI scan of the brain obtained shows no acute infarct.  Slight prominence of left parietal gyri on diffusion-weighted imaging compared to the right side raise concern for seizures.  LKW: 1400 TNK given?: No history of ICH Premorbid modified Rankin scale (mRS): 1 1-No significant post stroke disability and can perform usual duties with stroke symptoms  ROS:  Unable to obtain due to altered mental status.   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 She has never used smokeless tobacco. She reports that she does not drink alcohol and does not use drugs.  Medications  Current Facility-Administered Medications:    0.9 %  sodium chloride infusion, , Intravenous, PRN, Sigmund Hazel, MD   levETIRAcetam (KEPPRA) IVPB 1000 mg/100 mL premix, 1,000 mg, Intravenous, Once, de Saintclair Halsted, [place] E, NP   [START ON 02/05/2023] levETIRAcetam (KEPPRA) IVPB 500 mg/100 mL premix, 500 mg, Intravenous, Q12H, de Saintclair Halsted, Cortney E, NP  Current Outpatient Medications:    amLODipine (NORVASC) 10 MG tablet, Take 1 tablet (10 mg total) by mouth at bedtime., Disp: 30 tablet, Rfl: 0   atorvastatin (LIPITOR) 40 MG tablet, Take 40 mg by mouth daily. , Disp: , Rfl:    Brinzolamide-Brimonidine (SIMBRINZA) 1-0.2 % SUSP, Place 1 drop into both eyes daily at 12 noon. (Patient not taking: Reported on 01/06/2021), Disp: , Rfl:    Calcium Carbonate-Vitamin D (CALCIUM-D PO), Take 600 mg by mouth daily., Disp: , Rfl:    Cholecalciferol (VITAMIN  D3) 2000 units TABS, Take 2,000 Units by mouth daily. , Disp: , Rfl:    dorzolamide-timolol (COSOPT) 22.3-6.8 MG/ML ophthalmic solution, Place 1 drop into both eyes daily. , Disp: , Rfl:    eltrombopag (PROMACTA) 25 MG tablet, Take 1 tablet (25 mg) on Monday Wednesday and Friday .take on an empty stomach, 1 hour before a meal or 2 hours after., Disp: 30 tablet, Rfl: 11   furosemide (LASIX) 20 MG tablet, Take 40 mg for 3 days then resume your 20 mg daily (Patient taking differently: Take 20 mg by mouth daily.), Disp: 30 tablet, Rfl:    ibandronate (BONIVA) 150 MG tablet, Take 150 mg by mouth every morning., Disp: , Rfl:    latanoprost (XALATAN) 0.005 %  ophthalmic solution, Place 1 drop into both eyes at bedtime. , Disp: , Rfl:    lisinopril (ZESTRIL) 40 MG tablet, Take 1 tablet (40 mg total) by mouth daily., Disp: , Rfl:    Multiple Vitamin (MULTIVITAMIN WITH MINERALS) TABS tablet, Take 1 tablet by mouth daily., Disp: , Rfl:    sertraline (ZOLOFT) 25 MG tablet, Take 25 mg by mouth daily., Disp: , Rfl:    Exam: Current vital signs: Wt 49.2 kg   BMI 22.67 kg/m  Vital signs in last 24 hours: Weight:  [49.2 kg] 49.2 kg (05/24 1500)  GENERAL: Awake, alert in NAD HEENT: - Normocephalic and atraumatic, moist mm Ext: warm, well perfused, no edema  NEURO:  Mental Status: Nonverbal, unable to follow commands.  Will visually track Language: Patient is mute, will make sounds with noxious stimuli Cranial Nerves: PERRL  EOMI, visual fields full, no facial asymmetry, noncooperative with tongue protrusion Motor: Withdraws all 4 extremities to noxious Tone: is normal and bulk is normal Sensation- Intact to noxious stimuli throughout Coordination: Unable to perform Gait- deferred  1a Level of Conscious.: 0 1b LOC Questions: 2 1c LOC Commands: 2 2 Best Gaze: 0 3 Visual: 0 4 Facial Palsy: 0 5a Motor Arm - left: 1 5b Motor Arm - Right: 1 6a Motor Leg - Left: 1 6b Motor Leg - Right: 1 7 Limb Ataxia: 0 8 Sensory: 0 9 Best Language: 3 10 Dysarthria: 2 11 Extinct. and Inatten.: 0 TOTAL: 13   Labs I have reviewed labs in epic and the results pertinent to this consultation are:   CBC    Component Value Date/Time   WBC 8.2 02/04/2023 1501   RBC 4.69 02/04/2023 1501   HGB 14.6 02/04/2023 1508   HGB 14.3 02/01/2023 0923   HCT 43.0 02/04/2023 1508   PLT 116 (L) 02/04/2023 1501   PLT 109 (L) 02/01/2023 0923   MCV 93.0 02/04/2023 1501   MCH 31.1 02/04/2023 1501   MCHC 33.5 02/04/2023 1501   RDW 12.9 02/04/2023 1501   LYMPHSABS 4.3 (H) 02/04/2023 1501   MONOABS 0.6 02/04/2023 1501   EOSABS 0.2 02/04/2023 1501   BASOSABS 0.0  02/04/2023 1501    CMP     Component Value Date/Time   NA 140 02/04/2023 1508   K 3.7 02/04/2023 1508   CL 104 02/04/2023 1508   CO2 29 02/01/2023 0923   GLUCOSE 155 (H) 02/04/2023 1508   BUN 17 02/04/2023 1508   CREATININE 1.00 02/04/2023 1508   CREATININE 0.81 02/01/2023 0923   CALCIUM 9.3 02/01/2023 0923   PROT 6.6 02/01/2023 0923   ALBUMIN 4.3 02/01/2023 0923   AST 21 02/01/2023 0923   ALT 9 02/01/2023 0923   ALKPHOS 53 02/01/2023 1610  BILITOT 0.5 02/01/2023 0923   GFRNONAA >60 02/01/2023 0923   GFRAA >60 06/06/2020 1000    Lipid Panel  No results found for: "CHOL", "TRIG", "HDL", "CHOLHDL", "VLDL", "LDLCALC", "LDLDIRECT"   Imaging I have reviewed the images obtained:  CT-head: No acute abnormalities  CTA head and neck: No LVO or hemodynamically significant stenosis  MRI examination of the brain: No acute infarct, no acute abnormalities  Assessment:  82 y.o. female with history of CLL, ITP and ICH presenting with aphasia and generalized weakness.  Patient was found by her husband unable to speak or follow commands with weakness that appeared greater on right side than left.  On arrival to the ED, she was noted to have generalized weakness and aphasia.  CT head was negative for acute abnormality, CT angio was negative for LVO, and MRI revealed no stroke.  Given patient's risk factor of previous ICH, it is likely that she had an unwitnessed seizure and is having a prolonged postictal state.  Impression: Unwitnessed seizure with postictal confusion and aphasia likely.    Recommendations: -Admit to hospitalist service -Loaded with Keppra 20 mg/kg -Keppra 500 mg twice daily, to continue indefinitely -Overnight EEG monitoring, will check before 1900   Patient seen and examined by NP/APP with MD. MD to update note as needed.   Cortney E Ernestina Columbia , MSN, AGACNP-BC [facility] See Amion for schedule and pager information 02/04/2023 4:00 PM   STROKE  MD NOTE :  I have personally obtained history,examined this patient, reviewed notes, independently viewed imaging studies, participated in medical decision making and plan of care.ROS completed by me personally and pertinent positives fully documented  I have made any additions or clarifications directly to the above note. Agree with note above.  Patient presented with sudden onset of altered mental status with global aphasia unable to speak or follow commands without focal facial or extremity weakness.  No definite weakness or witnessed seizure activity.  CT head as well as CT angiogram of the brain and neck and MRI are both negative for acute stroke but there is subtle MRI diffusion changes raising concern for seizures.  Recommend load with IV Keppra followed by 500 mg twice daily.  Long-term overnight EEG monitoring.  Neurohospitalist team to follow in the morning.  Long discussion with the patient husband and daughter at the bedside and answered questions.  Discussed with ER physician and Dr. Selina Cooley neurohospitalist on-call. This patient is critically ill and at significant risk of neurological worsening, death and care requires constant monitoring of vital signs, hemodynamics,respiratory and cardiac monitoring, extensive review of multiple databases, frequent neurological assessment, discussion with family, other specialists and medical decision making of high complexity.I have made any additions or clarifications directly to the above note.This critical care time does not reflect procedure time, or teaching time or supervisory time of PA/NP/Med Resident etc but could involve care discussion time.  I spent 60 minutes of neurocritical care time  in the care of  this patient.     Delia Heady, MD Medical Director [facility] Pager: [phone] 02/04/2023 5:20 PM

## 2023-02-04 NOTE — H&P (Addendum)
History and Physical    Selena Branch:[id number] DOB: 1941-06-08 DOA: 02/04/2023  PCP: Gweneth Dimitri, MD  Patient coming from: [facility]  I have personally briefly reviewed patient's old medical records in [facility]  Chief Complaint: Aphasia/right-sided weakness  HPI: Selena Branch is a 82 y.o. female with medical history significant of CLL currently in remission, glaucoma, hypertension, thrombocytopenia, hyperlipidemia presenting to the ED with sudden onset altered mental status/aphasia. Patient currently aphasic at my interview, no family at bedside and as such history obtained per ED physician. It is noted that per ED physician patient's family had stated that patient was talking on the phone shortly prior to arrival, husband had gone to check the mail and when he came back patient was sitting on the couch with her robe unzipped and unable to speak.  Also noted with some right-sided weakness.  Family did not witness any shaking or seizure-like activity.  No tongue biting.  No bowel or bladder incontinence.  Patient prehospital arrival was brought in as a code stroke due to her aphasia.  Patient noted at baseline to be alert oriented x 3 and ambulatory able to care for herself and her husband. No recent fevers, no chills, no nausea, no vomiting, no chest pain, no shortness of breath, no abdominal pain, no diarrhea, no constipation, no dysuria, no syncopal episodes, no lightheadedness, no weakness, no melena, no hematemesis, no hematochezia.  ED Course: Patient seen in the ED, blood glucose level noted at 108.  Comprehensive metabolic profile done with a glucose of 155 otherwise was within normal limits.  CBC done with a platelet count of 116 otherwise within normal limits.  INR of 1.0.  Urinalysis pending.  Chest x-ray done with low lung volumes, patchy retrocardiac opacity, atelectasis versus pneumonia.  Head CT done was negative for any acute hemorrhage or CT evidence of acute cortical  infarct.  CT angiogram done head and neck with no large vessel occlusion, hemodynamically significant stenosis or aneurysm of the head or neck vessels.  MRI brain done with no acute intracranial process.  Patient seen in consultation by neurology, felt patient may have an unwitnessed seizure patient loaded with IV Keppra and EEG placed.  Review of Systems: As per HPI otherwise all other systems reviewed and are negative.  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 No pertinent surgical history.  Social History  reports that she has never smoked. She has never used smokeless tobacco. She reports that she does not drink alcohol and does not use drugs.  Allergies  Allergen Reactions   Codeine Nausea Only   Evista [Raloxifene]     Other reaction(s): leg cramps   Alendronate     Other reaction(s): GI Upset (intolerance)   Ibandronic Acid Other (See Comments)    Other reaction(s): gi upset, GI Upset (intolerance)   Risedronate     Other reaction(s): GI Upset (intolerance)    Family History  Problem Relation Age of Onset   Stroke Mother    Lung cancer Father    Leukemia Neg Hx    Breast cancer Neg Hx    Ovarian cancer Neg Hx    Colon cancer Neg Hx    Endometrial cancer Neg Hx    Prostate cancer Neg Hx    Pancreatic cancer Neg Hx  Mother deceased from acute CVA, father deceased from lung cancer.  Prior to Admission medications   Medication Sig Start Date End Date Taking? Authorizing Provider  amLODipine (NORVASC) 10 MG tablet Take 1 tablet (10 mg total) by mouth at bedtime. 09/20/19   Mikhail, Nita Sells, DO  atorvastatin (LIPITOR) 40 MG tablet Take 40 mg by mouth daily.     [provider]  Calcium Carbonate-Vitamin D (CALCIUM-D PO) Take 600 mg by mouth daily.    [provider]  Cholecalciferol (VITAMIN D3) 2000 units  TABS Take 2,000 Units by mouth daily.     [provider]  dorzolamide-timolol (COSOPT) 22.3-6.8 MG/ML ophthalmic solution Place 1 drop into both eyes daily.  12/10/15   [provider]  eltrombopag (PROMACTA) 25 MG tablet Take 1 tablet (25 mg) on Monday Wednesday and Friday .take on an empty stomach, 1 hour before a meal or 2 hours after. 01/14/23   Johney Maine, MD  furosemide (LASIX) 20 MG tablet Take 40 mg for 3 days then resume your 20 mg daily Patient not taking: Reported on 02/04/2023 09/01/19   Joseph Art, DO  ibandronate (BONIVA) 150 MG tablet Take 150 mg by mouth every morning. 10/26/21   [provider]  latanoprost (XALATAN) 0.005 % ophthalmic solution Place 1 drop into both eyes at bedtime.  12/10/15   [provider]  lisinopril (ZESTRIL) 40 MG tablet Take 1 tablet (40 mg total) by mouth daily. 07/30/22   Johney Maine, MD  Multiple Vitamin (MULTIVITAMIN WITH MINERALS) TABS tablet Take 1 tablet by mouth daily.    [provider]  sertraline (ZOLOFT) 25 MG tablet Take 25 mg by mouth daily. 11/22/20   [provider]    Physical Exam: Vitals:   02/04/23 1503 02/04/23 1505 02/04/23 1515 02/04/23 1545  BP: (!) 148/54 (!) 148/54 (!) 152/34 (!) 145/63  Pulse: 71     Resp: 18     SpO2: 93%     Weight:        Constitutional: NAD .  Aphasic.  EEG electrodes on. Vitals:   02/04/23 1503 02/04/23 1505 02/04/23 1515 02/04/23 1545  BP: (!) 148/54 (!) 148/54 (!) 152/34 (!) 145/63  Pulse: 71     Resp: 18     SpO2: 93%     Weight:       Eyes: PERRLA, lids and conjunctivae normal ENMT: Mucous membranes are dry.  Patient with teeth clenched and refuses to open mouth and as such unable to assess posterior pharynx or dentition.  Neck: normal, supple, no masses, no thyromegaly Respiratory: clear to auscultation bilaterally anterior lung fields, no wheezing, no crackles. Normal respiratory effort. No accessory muscle use.   Cardiovascular: Regular rate and rhythm, no murmurs / rubs / gallops. No extremity edema. 2+ pedal pulses. No carotid bruits.  Abdomen: no tenderness, no masses palpated. No hepatosplenomegaly. Bowel sounds positive.  Musculoskeletal: no clubbing / cyanosis. No joint deformity upper and lower extremities.  Unable to assess range of motion.  Normal muscle tone.  No contractures noted.  Skin: no rashes, lesions, ulcers. No induration Neurologic: Alert.  Eyes open and tracking however aphasic.  Unable to assess rest of cranial nerve exam.  2+ bilateral and brachioradialis reflexes.  3+ bilateral patellar reflexes.  Unable to assess strength in upper extremities symmetrically and bilaterally.  Psychiatric: Unable to assess.    Labs on Admission: I have personally reviewed following labs and imaging studies  CBC: Recent Labs  Lab 02/01/23  4782 02/04/23 1501 02/04/23 1508  WBC 6.0 8.2  --   NEUTROABS 2.4 3.1  --   HGB 14.3 14.6 14.6  HCT 41.0 43.6 43.0  MCV 93.0 93.0  --   PLT 109* 116*  --     Basic Metabolic Panel: Recent Labs  Lab 02/01/23 0923 02/04/23 1501 02/04/23 1508  NA 144 139 140  K 3.9 3.5 3.7  CL 107 104 104  CO2 29 26  --   GLUCOSE 108* 155* 155*  BUN 18 15 17   CREATININE 0.81 0.99 1.00  CALCIUM 9.3 9.5  --     GFR: CrCl cannot be calculated (Unknown ideal weight.).  Liver Function Tests: Recent Labs  Lab 02/01/23 0923 02/04/23 1501  AST 21 28  ALT 9 14  ALKPHOS 53 53  BILITOT 0.5 0.5  PROT 6.6 6.8  ALBUMIN 4.3 4.2    Urine analysis:    Component Value Date/Time   COLORURINE YELLOW 10/15/2019 1023   APPEARANCEUR CLEAR 10/15/2019 1023   LABSPEC 1.009 10/15/2019 1023   PHURINE 7.0 10/15/2019 1023   GLUCOSEU NEGATIVE 10/15/2019 1023   HGBUR SMALL (A) 10/15/2019 1023   BILIRUBINUR NEGATIVE 10/15/2019 1023   KETONESUR NEGATIVE 10/15/2019 1023   PROTEINUR NEGATIVE 10/15/2019 1023   UROBILINOGEN 0.2 05/06/2010 1611   NITRITE NEGATIVE 10/15/2019  1023   LEUKOCYTESUR NEGATIVE 10/15/2019 1023    Radiological Exams on Admission: DG Chest Port 1 View  Result Date: 02/04/2023 CLINICAL DATA:  Altered mental status EXAM: PORTABLE CHEST 1 VIEW COMPARISON:  CT 12/01/2020 FINDINGS: Low lung volumes. Patchy retrocardiac opacity. No pleural effusion. Normal cardiac size. Aortic atherosclerosis IMPRESSION: Low lung volumes with patchy retrocardiac opacity, atelectasis versus pneumonia. Electronically Signed   By: Jasmine Pang M.D.   On: 02/04/2023 17:22   MR BRAIN WO CONTRAST  Result Date: 02/04/2023 CLINICAL DATA:  Neuro deficit, acute, stroke suspected. EXAM: MRI HEAD WITHOUT CONTRAST TECHNIQUE: Multiplanar, multiecho pulse sequences of the brain and surrounding structures were obtained without intravenous contrast. COMPARISON:  Head CT 02/04/2023 and older.  MRI brain 01/14/2016. FINDINGS: Brain: No acute infarct or hemorrhage. Sequela of prior hemorrhage in the left superior temporal gyrus. Old microhemorrhage in the posterior right temporal lobe. No hydrocephalus or extra-axial collection. No mass or midline shift. Vascular: Normal flow voids. Skull and upper cervical spine: Normal marrow signal. Sinuses/Orbits: Unremarkable. Other: None. IMPRESSION: No acute intracranial process. Electronically Signed   By: Orvan Falconer M.D.   On: 02/04/2023 16:10   CT ANGIO HEAD NECK W WO CM (CODE STROKE)  Result Date: 02/04/2023 CLINICAL DATA:  Neuro deficit, acute, stroke suspected right sided weakness, aphasia. EXAM: CT ANGIOGRAPHY HEAD AND NECK WITH AND WITHOUT CONTRAST TECHNIQUE: Multidetector CT imaging of the head and neck was performed using the standard protocol during bolus administration of intravenous contrast. Multiplanar CT image reconstructions and MIPs were obtained to evaluate the vascular anatomy. Carotid stenosis measurements (when applicable) are obtained utilizing NASCET criteria, using the distal internal carotid diameter as the  denominator. RADIATION DOSE REDUCTION: This exam was performed according to the departmental dose-optimization program which includes automated exposure control, adjustment of the mA and/or kV according to patient size and/or use of iterative reconstruction technique. CONTRAST:  50mL OMNIPAQUE IOHEXOL 350 MG/ML SOLN COMPARISON:  Head CT 02/04/2023.  MRI brain 01/14/2016. FINDINGS: CTA NECK FINDINGS Aortic arch: Three-vessel arch configuration. Atherosclerotic calcifications of the aortic arch and arch vessel origins. Arch vessel origins are patent. Right carotid system: No evidence of dissection, stenosis (  50% or greater), or occlusion. Left carotid system: No evidence of dissection, stenosis (50% or greater), or occlusion. Vertebral arteries: Codominant. No evidence of dissection, stenosis (50% or greater), or occlusion. 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 Other neck: Unremarkable. Upper chest: Unremarkable. Review of the MIP images confirms the above findings CTA HEAD FINDINGS Anterior circulation: Intracranial ICAs are patent without stenosis or aneurysm. The proximal ACAs and MCAs are patent without stenosis or aneurysm. Distal branches are symmetric. Posterior circulation: Normal basilar artery. The SCAs, AICAs and PICAs are patent proximally. The PCAs are patent proximally without stenosis or aneurysm. Distal branches are symmetric. Venous sinuses: Patent. 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 Review of the MIP images confirms the above findings IMPRESSION: No large vessel occlusion, hemodynamically significant stenosis or aneurysm of the head or neck vessels. Aortic Atherosclerosis (ICD10-I70.0). Electronically Signed   By: Orvan Falconer M.D.   On: 02/04/2023 15:42   CT HEAD CODE STROKE WO CONTRAST  Result Date: 02/04/2023 CLINICAL DATA:  Code stroke. Neuro deficit, acute,  stroke suspected no lateralizing information provided. EXAM: CT HEAD WITHOUT CONTRAST TECHNIQUE: Contiguous axial images were obtained from the base of the skull through the vertex without intravenous contrast. RADIATION DOSE REDUCTION: This exam was performed according to the departmental dose-optimization program which includes automated exposure control, adjustment of the mA and/or kV according to patient size and/or use of iterative reconstruction technique. COMPARISON:  CT Head 11/14/19 FINDINGS: Brain: No evidence of acute infarction, hemorrhage, hydrocephalus, extra-axial collection or mass lesion/mass effect. Sequela of moderate chronic microvascular ischemic change. Unchanged small cystic lesion in the left temporal lobe (series 2, image 17), previously favored to represent a chronic evolving hemorrhage. Vascular: No hyperdense vessel or unexpected calcification. Skull: Normal. Negative for fracture or focal lesion. Sinuses/Orbits: No middle ear or mastoid effusion. Paranasal sinuses are clear. Orbits are notable for bilateral lens replacement, otherwise unremarkable. Other: None. ASPECTS [facility] Stroke Program Early CT Score):: 10 IMPRESSION: No acute hemorrhage or CT evidence of an acute cortical infarct. Findings were paged to Dr. Pearlean Brownie on 02/04/23 at 3:17 PM via [facility] paging system. Electronically Signed   By: Lorenza Cambridge M.D.   On: 02/04/2023 15:17    EKG: Independently reviewed.  Normal sinus rhythm, no ischemic changes noted  Assessment/Plan Principal Problem:   Seizure (HCC): Unwitnessed Active Problems:   Aphasia   CLL (chronic lymphocytic leukemia) (HCC)   Thrombocytopenia (HCC)   Anemia   Essential hypertension   Dyslipidemia   Intracerebral hemorrhage   Primary open angle glaucoma of right eye    #1 probable unwitnessed seizure with postictal state and aphasia -Patient presented with sudden onset aphasia, concern for some right-sided weakness greater than left per  ED. -Patient with no signs or symptoms of infection. -Urinalysis pending. -CT head negative for any acute abnormalities. -CT angiogram head and neck with no LVO, no hemodynamically significant stenosis or aneurysm of the head or neck vessels. -MRI brain negative for any acute intracranial process. -Patient initially brought in as a code stroke which was subsequently canceled. -Patient seen by neurology concern for unwitnessed seizure and postictal state recommending patient be admitted to the hospitalist service for further evaluation. -Patient loaded with IV Keppra and currently on IV Keppra 500 mg twice daily. -EEG leads on 4 long-term overnight EEG monitoring.. -Seizure precautions. -When patient is more alert SLP evaluation.  2.  Glaucoma  -resume  home eyedrops.  3.  Hypertension -Patient aphasic, not opening her mouth or answering any questions. -Placed on IV enalapril for now.  IV hydralazine as needed.  4.  Hyperlipidemia -Hold statin. -When more alert and tolerating oral intake we will resume statin.  5.  CLL/thrombocytopenia -Stable. -Outpatient follow-up with oncology.  6.  History of intracerebral hemorrhage -Stable.  DVT prophylaxis: SCDs Code Status:  DNR Family Communication:  Updated daughter via telephone Disposition Plan:   Patient is from:  Home  Anticipated DC to:  Home  Anticipated DC date:  TBD  Anticipated DC barriers: Clinical improvement  Consults called:  Neurology: Dr. Pearlean Brownie Admission status:  Place in observation.    Severity of Illness: The appropriate patient status for this patient is OBSERVATION. Observation status is judged to be reasonable and necessary in order to provide the required intensity of service to ensure the patient's safety. The patient's presenting symptoms, physical exam findings, and initial radiographic and laboratory data in the context of their medical condition is felt to place them at decreased risk for further clinical  deterioration. Furthermore, it is anticipated that the patient will be medically stable for discharge from the hospital within 2 midnights of admission.     Ramiro Harvest MD [facility]  How to contact the [facility] Attending or Consulting provider 7A - 7P or covering provider during after hours 7P -7A, for this patient?   Check the care team in [facility] and look for a) attending/consulting [facility] provider listed and b) the [facility] team listed Log into [email or web address] and use [place]'s universal password to access. If you do not have the password, please contact the hospital operator. Locate the [facility] provider you are looking for under [facility] and page to a number that you can be directly reached. If you still have difficulty reaching the provider, please page the [facility] (Director on Call) for the Hospitalists listed on amion for assistance.  02/04/2023, 5:34 PM

## 2023-02-04 NOTE — Progress Notes (Cosign Needed Addendum)
STAT LTM. Results pending. Patient has on CT compatible leads. [facility] not monitoring due to patient being in the ED. Will get [facility] to monitor as soon as she is in a room on the floor.

## 2023-02-05 DIAGNOSIS — Z7401 Bed confinement status: Secondary | ICD-10-CM | POA: Diagnosis not present

## 2023-02-05 DIAGNOSIS — Z823 Family history of stroke: Secondary | ICD-10-CM | POA: Diagnosis not present

## 2023-02-05 DIAGNOSIS — I10 Essential (primary) hypertension: Secondary | ICD-10-CM | POA: Diagnosis not present

## 2023-02-05 DIAGNOSIS — E785 Hyperlipidemia, unspecified: Secondary | ICD-10-CM | POA: Diagnosis not present

## 2023-02-05 DIAGNOSIS — J9811 Atelectasis: Secondary | ICD-10-CM | POA: Diagnosis not present

## 2023-02-05 DIAGNOSIS — G40802 Other epilepsy, not intractable, without status epilepticus: Secondary | ICD-10-CM | POA: Diagnosis not present

## 2023-02-05 DIAGNOSIS — R4182 Altered mental status, unspecified: Secondary | ICD-10-CM | POA: Diagnosis not present

## 2023-02-05 DIAGNOSIS — Z801 Family history of malignant neoplasm of trachea, bronchus and lung: Secondary | ICD-10-CM | POA: Diagnosis not present

## 2023-02-05 DIAGNOSIS — R2681 Unsteadiness on feet: Secondary | ICD-10-CM | POA: Diagnosis not present

## 2023-02-05 DIAGNOSIS — D649 Anemia, unspecified: Secondary | ICD-10-CM | POA: Diagnosis not present

## 2023-02-05 DIAGNOSIS — R404 Transient alteration of awareness: Secondary | ICD-10-CM | POA: Diagnosis not present

## 2023-02-05 DIAGNOSIS — R410 Disorientation, unspecified: Secondary | ICD-10-CM | POA: Diagnosis not present

## 2023-02-05 DIAGNOSIS — Z4659 Encounter for fitting and adjustment of other gastrointestinal appliance and device: Secondary | ICD-10-CM | POA: Diagnosis not present

## 2023-02-05 DIAGNOSIS — X58XXXA Exposure to other specified factors, initial encounter: Secondary | ICD-10-CM | POA: Diagnosis not present

## 2023-02-05 DIAGNOSIS — Z885 Allergy status to narcotic agent status: Secondary | ICD-10-CM | POA: Diagnosis not present

## 2023-02-05 DIAGNOSIS — E876 Hypokalemia: Secondary | ICD-10-CM | POA: Diagnosis not present

## 2023-02-05 DIAGNOSIS — S301XXS Contusion of abdominal wall, sequela: Secondary | ICD-10-CM | POA: Diagnosis not present

## 2023-02-05 DIAGNOSIS — E440 Moderate protein-calorie malnutrition: Secondary | ICD-10-CM | POA: Diagnosis not present

## 2023-02-05 DIAGNOSIS — F419 Anxiety disorder, unspecified: Secondary | ICD-10-CM | POA: Diagnosis not present

## 2023-02-05 DIAGNOSIS — R4701 Aphasia: Secondary | ICD-10-CM | POA: Diagnosis not present

## 2023-02-05 DIAGNOSIS — Z79899 Other long term (current) drug therapy: Secondary | ICD-10-CM | POA: Diagnosis not present

## 2023-02-05 DIAGNOSIS — F05 Delirium due to known physiological condition: Secondary | ICD-10-CM | POA: Diagnosis not present

## 2023-02-05 DIAGNOSIS — Z66 Do not resuscitate: Secondary | ICD-10-CM | POA: Diagnosis not present

## 2023-02-05 DIAGNOSIS — K661 Hemoperitoneum: Secondary | ICD-10-CM | POA: Diagnosis not present

## 2023-02-05 DIAGNOSIS — Z781 Physical restraint status: Secondary | ICD-10-CM | POA: Diagnosis not present

## 2023-02-05 DIAGNOSIS — Z888 Allergy status to other drugs, medicaments and biological substances status: Secondary | ICD-10-CM | POA: Diagnosis not present

## 2023-02-05 DIAGNOSIS — S301XXD Contusion of abdominal wall, subsequent encounter: Secondary | ICD-10-CM | POA: Diagnosis not present

## 2023-02-05 DIAGNOSIS — D696 Thrombocytopenia, unspecified: Secondary | ICD-10-CM | POA: Diagnosis not present

## 2023-02-05 DIAGNOSIS — R451 Restlessness and agitation: Secondary | ICD-10-CM | POA: Diagnosis not present

## 2023-02-05 DIAGNOSIS — J90 Pleural effusion, not elsewhere classified: Secondary | ICD-10-CM | POA: Diagnosis not present

## 2023-02-05 DIAGNOSIS — R0689 Other abnormalities of breathing: Secondary | ICD-10-CM | POA: Diagnosis not present

## 2023-02-05 DIAGNOSIS — C9110 Chronic lymphocytic leukemia of B-cell type not having achieved remission: Secondary | ICD-10-CM | POA: Diagnosis not present

## 2023-02-05 DIAGNOSIS — G40901 Epilepsy, unspecified, not intractable, with status epilepticus: Secondary | ICD-10-CM | POA: Diagnosis not present

## 2023-02-05 DIAGNOSIS — D62 Acute posthemorrhagic anemia: Secondary | ICD-10-CM | POA: Diagnosis not present

## 2023-02-05 DIAGNOSIS — Z6822 Body mass index (BMI) 22.0-22.9, adult: Secondary | ICD-10-CM | POA: Diagnosis not present

## 2023-02-05 DIAGNOSIS — I619 Nontraumatic intracerebral hemorrhage, unspecified: Secondary | ICD-10-CM | POA: Diagnosis not present

## 2023-02-05 DIAGNOSIS — E7220 Disorder of urea cycle metabolism, unspecified: Secondary | ICD-10-CM | POA: Diagnosis not present

## 2023-02-05 DIAGNOSIS — R41841 Cognitive communication deficit: Secondary | ICD-10-CM | POA: Diagnosis not present

## 2023-02-05 DIAGNOSIS — R569 Unspecified convulsions: Secondary | ICD-10-CM | POA: Diagnosis not present

## 2023-02-05 DIAGNOSIS — K8020 Calculus of gallbladder without cholecystitis without obstruction: Secondary | ICD-10-CM | POA: Diagnosis not present

## 2023-02-05 DIAGNOSIS — M810 Age-related osteoporosis without current pathological fracture: Secondary | ICD-10-CM | POA: Diagnosis not present

## 2023-02-05 DIAGNOSIS — M6281 Muscle weakness (generalized): Secondary | ICD-10-CM | POA: Diagnosis not present

## 2023-02-05 DIAGNOSIS — F39 Unspecified mood [affective] disorder: Secondary | ICD-10-CM | POA: Diagnosis not present

## 2023-02-05 DIAGNOSIS — N390 Urinary tract infection, site not specified: Secondary | ICD-10-CM | POA: Diagnosis not present

## 2023-02-05 DIAGNOSIS — R443 Hallucinations, unspecified: Secondary | ICD-10-CM | POA: Diagnosis not present

## 2023-02-05 DIAGNOSIS — R5381 Other malaise: Secondary | ICD-10-CM | POA: Diagnosis not present

## 2023-02-05 DIAGNOSIS — R2689 Other abnormalities of gait and mobility: Secondary | ICD-10-CM | POA: Diagnosis not present

## 2023-02-05 DIAGNOSIS — S301XXA Contusion of abdominal wall, initial encounter: Secondary | ICD-10-CM | POA: Diagnosis not present

## 2023-02-05 DIAGNOSIS — C9111 Chronic lymphocytic leukemia of B-cell type in remission: Secondary | ICD-10-CM | POA: Diagnosis not present

## 2023-02-05 LAB — MAGNESIUM: Magnesium: 1.9 mg/dL (ref 1.7–2.4)

## 2023-02-05 LAB — CBC WITH DIFFERENTIAL/PLATELET
Abs Immature Granulocytes: 0.03 10*3/uL (ref 0.00–0.07)
Basophils Absolute: 0 10*3/uL (ref 0.0–0.1)
Basophils Relative: 0 %
Eosinophils Absolute: 0 10*3/uL (ref 0.0–0.5)
Eosinophils Relative: 0 %
HCT: 41.2 % (ref 36.0–46.0)
Hemoglobin: 14.2 g/dL (ref 12.0–15.0)
Immature Granulocytes: 0 %
Lymphocytes Relative: 17 %
Lymphs Abs: 1.5 10*3/uL (ref 0.7–4.0)
MCH: 31.8 pg (ref 26.0–34.0)
MCHC: 34.5 g/dL (ref 30.0–36.0)
MCV: 92.4 fL (ref 80.0–100.0)
Monocytes Absolute: 0.5 10*3/uL (ref 0.1–1.0)
Monocytes Relative: 6 %
Neutro Abs: 7 10*3/uL (ref 1.7–7.7)
Neutrophils Relative %: 77 %
Platelets: 90 10*3/uL — ABNORMAL LOW (ref 150–400)
RBC: 4.46 MIL/uL (ref 3.87–5.11)
RDW: 12.8 % (ref 11.5–15.5)
WBC: 9.1 10*3/uL (ref 4.0–10.5)
nRBC: 0 % (ref 0.0–0.2)

## 2023-02-05 LAB — COMPREHENSIVE METABOLIC PANEL
ALT: 22 U/L (ref 0–44)
AST: 30 U/L (ref 15–41)
Albumin: 4 g/dL (ref 3.5–5.0)
Alkaline Phosphatase: 42 U/L (ref 38–126)
Anion gap: 11 (ref 5–15)
BUN: 14 mg/dL (ref 8–23)
CO2: 22 mmol/L (ref 22–32)
Calcium: 8.6 mg/dL — ABNORMAL LOW (ref 8.9–10.3)
Chloride: 106 mmol/L (ref 98–111)
Creatinine, Ser: 0.76 mg/dL (ref 0.44–1.00)
GFR, Estimated: >60 mL/min (ref >60)
Glucose, Bld: 124 mg/dL — ABNORMAL HIGH (ref 70–99)
Potassium: 3.6 mmol/L (ref 3.5–5.1)
Sodium: 139 mmol/L (ref 135–145)
Total Bilirubin: 0.9 mg/dL (ref 0.3–1.2)
Total Protein: 6.3 g/dL — ABNORMAL LOW (ref 6.5–8.1)

## 2023-02-05 LAB — TSH: TSH: 0.618 u[IU]/mL (ref 0.350–4.500)

## 2023-02-05 LAB — LIPID PANEL
Cholesterol: 139 mg/dL (ref 0–200)
HDL: 64 mg/dL (ref >40)
LDL Cholesterol: 69 mg/dL (ref 0–99)
Total CHOL/HDL Ratio: 2.2 RATIO
Triglycerides: 31 mg/dL (ref <150)
VLDL: 6 mg/dL (ref 0–40)

## 2023-02-05 MED ORDER — AMLODIPINE BESYLATE 10 MG PO TABS
10.0000 mg | ORAL_TABLET | Freq: Every day | ORAL | Status: DC
  Administered 2023-02-07 – 2023-02-11 (×5): 10 mg via ORAL
  Filled 2023-02-05 (×6): qty 1

## 2023-02-05 MED ORDER — ELTROMBOPAG OLAMINE 25 MG PO TABS: 50.0000 mg | ORAL_TABLET | Freq: Every day | ORAL | Status: DC

## 2023-02-05 MED ORDER — HEPARIN SODIUM (PORCINE) 5000 UNIT/ML IJ SOLN
5000.0000 [IU] | Freq: Three times a day (TID) | INTRAMUSCULAR | Status: DC
  Administered 2023-02-05 – 2023-02-12 (×21): 5000 [IU] via SUBCUTANEOUS
  Filled 2023-02-05 (×20): qty 1

## 2023-02-05 MED ORDER — LEVETIRACETAM IN NACL 1500 MG/100ML IV SOLN
1500.0000 mg | Freq: Two times a day (BID) | INTRAVENOUS | Status: DC
  Administered 2023-02-05 – 2023-02-09 (×9): 1500 mg via INTRAVENOUS
  Filled 2023-02-05 (×11): qty 100

## 2023-02-05 MED ORDER — VENETOCLAX 100 MG PO TABS: 200.0000 mg | ORAL_TABLET | Freq: Every day | ORAL | Status: DC

## 2023-02-05 MED ORDER — SODIUM CHLORIDE 0.9 % IV SOLN
200.0000 mg | Freq: Once | INTRAVENOUS | Status: AC
  Administered 2023-02-05: 200 mg via INTRAVENOUS
  Filled 2023-02-05: qty 20

## 2023-02-05 MED ORDER — LEVETIRACETAM IN NACL 1000 MG/100ML IV SOLN
1000.0000 mg | INTRAVENOUS | Status: AC
  Administered 2023-02-05: 1000 mg via INTRAVENOUS
  Filled 2023-02-05: qty 100

## 2023-02-05 MED ORDER — LORAZEPAM 2 MG/ML IJ SOLN
1.0000 mg | Freq: Once | INTRAMUSCULAR | Status: AC
  Administered 2023-02-05: 1 mg via INTRAVENOUS
  Filled 2023-02-05: qty 1

## 2023-02-05 MED ORDER — SERTRALINE HCL 25 MG PO TABS
25.0000 mg | ORAL_TABLET | Freq: Every day | ORAL | Status: DC
  Administered 2023-02-08 – 2023-02-11 (×4): 25 mg via ORAL
  Filled 2023-02-05 (×6): qty 1

## 2023-02-05 MED ORDER — LEVETIRACETAM IN NACL 1000 MG/100ML IV SOLN
1000.0000 mg | Freq: Two times a day (BID) | INTRAVENOUS | Status: DC
  Filled 2023-02-05: qty 100

## 2023-02-05 MED ORDER — KCL IN DEXTROSE-NACL 10-5-0.45 MEQ/L-%-% IV SOLN
INTRAVENOUS | Status: DC
  Filled 2023-02-05 (×2): qty 1000

## 2023-02-05 MED ORDER — ATORVASTATIN CALCIUM 40 MG PO TABS
40.0000 mg | ORAL_TABLET | Freq: Every day | ORAL | Status: DC
  Administered 2023-02-08 – 2023-02-11 (×4): 40 mg via ORAL
  Filled 2023-02-05 (×6): qty 1

## 2023-02-05 MED ORDER — SODIUM CHLORIDE 0.9 % IV SOLN
100.0000 mg | Freq: Two times a day (BID) | INTRAVENOUS | Status: DC
  Administered 2023-02-05: 100 mg via INTRAVENOUS
  Filled 2023-02-05 (×3): qty 10

## 2023-02-05 NOTE — Procedures (Signed)
Patient Name: Selena Branch  MRN: [id number]  Epilepsy Attending: Charlsie Quest  Referring Physician/Provider: Marjorie Smolder, NP  Duration: 02/04/2023 1629 02/05/2023 1629  Patient history: 82 y.o. female with a past medical history of CLL, ITP, left parietal ICH in 2021 presenting with aphasia and generalized weakness. EEG to evaluate for seizure.  Level of alertness: lethargic  AEDs during EEG study: LEV  Technical aspects: This EEG study was done with scalp electrodes positioned according to the 10-20 International system of electrode placement. Electrical activity was reviewed with band pass filter of 1-70Hz , sensitivity of 7 uV/mm, display speed of 60mm/sec with a 60Hz  notched filter applied as appropriate. EEG data were recorded continuously and digitally stored.  Video monitoring was available and reviewed as appropriate.  Description: At the beginning of study, EEG showed lateralized periodic discharges in left hemisphere, maximal left parietal region at 2.5-3Hz  admixed with 3-5hz  theta-delta slowing and overriding 13-15hz  beta activity. Clinically patient was aphasic per neurologist. 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 Patient was loaded with Keppra  on 02/04/2023 at 1716 and subsequently status epilepticus resolved. After around 1830, EEG showed continuous generalized and lateralized left hemisphere 3 to 6 Hz theta-delta slowing. Lateralized periodic discharges at 0.5-1hz  were also noted arising from left hemisphere. Hyperventilation and photic stimulation were not performed.     EEG was difficult to interpret between 02/04/2023 1946 to 02/05/2023 1010 due to significant electrode artifact.   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 Clinically, per neurologist patient was aphasic. Patient was loaded with keppra  on 02/04/2023 at 1716 and subsequently status epilepticus resolved. EEG was then suggestive of epileptogenicity and cortical dysfunction arising from left hemisphere likely secondary to underlying structural abnormality and increased risk of seizure recurrence. Lastly there is moderate diffuse encephalopathy.   Desiree Fleming [name]

## 2023-02-05 NOTE — Progress Notes (Signed)
OT Cancellation Note  Patient Details Name: Selena Branch MRN: [id number] DOB: August 30, 1941   Cancelled Treatment:    Reason Eval/Treat Not Completed: Patient not medically ready.  Per discussion with nursing pt is not appropriate for therapy at this time secondary to mental status and is currently undergoing video EEG.  Will check back tomorrow to see if she is medically appropriate.   Jolonda Gomm OTR/L 02/05/2023, 3:10 PM

## 2023-02-05 NOTE — Plan of Care (Signed)
EEG reviewed this morning with left-sided PLEDs. Will load with Vimpat 200 mg x 1 and start Vimpat 100 twice daily from tonight. Increase Keppra to 1500 twice daily. We will follow   -- Milon Dikes, MD Neurologist [facility] Pager: [phone]

## 2023-02-05 NOTE — Progress Notes (Signed)
MB and GMD performed maintenance and replaced/repair/ re-glued approximately 8-10 electrodes. Multiple attempts to lower impedances on F4 electrode with no success but did not want to create skin breakdown issue. All impedances (other than F4) are below 10k0hms.

## 2023-02-05 NOTE — Evaluation (Signed)
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 No pertinent surgical history. HPI:  82 y.o. female with a past medical history of CLL, ITP, left parietal ICH in 2021 presenting with aphasia and generalized weakness.  Swallow eval and speech eval ordered.  Per notes, pt was independent PTA and took care of her husband who has dementia.    Assessment / Plan / Recommendation  Clinical Impression  Pt currently lethargic but does awaken with max verbal/tactile stimulation.   She does not appear with overt cranial nerve deficits and dentition appears intact.    Pt accepted minimal liquid from tsp with delayed swallow, wincing with swallow and verbalizing sigh.  She phonated once during session and her voice was clear.  Able to seal lips and avoided oral care.   Deferred SLE due to pt's mentation as she did not shake her head yes/no to correct name.  Attempted hand over hand assist, pt held the oral suction device but did not bring it to her mouth.  .  Suspect pt's swallow will be intact based on limited clinical eval, recommend single small ice chips and tsps water when pt awakens adequately.    RN reports pt received Ativan last night at 2000 - NP present and reports concern for ongoing seizures.  Will continue efforts.    SLP Visit Diagnosis: Dysphagia, unspecified (R13.10)    Aspiration Risk  Risk for inadequate nutrition/hydration    Diet Recommendation Ice chips PRN after oral care (and single tsps of water when fully alert)   Medication  Administration: Via alternative means Compensations: Slow rate;Small sips/bites Postural Changes: Remain upright for at least 30 minutes after po intake;Seated upright at 90 degrees    Other  Recommendations Oral Care Recommendations: Oral care QID    Recommendations for follow up therapy are one component of a multi-disciplinary discharge planning process, led by the attending physician.  Recommendations may be updated based on patient status, additional functional criteria and insurance authorization.  Follow up Recommendations Follow physician's recommendations for discharge plan and follow up therapies TBD     Assistance Recommended at Discharge  TBD  Functional Status Assessment Patient has had a recent decline in their functional status and demonstrates the ability to make significant improvements in function in a reasonable and predictable amount of time.  Frequency and Duration min 1 x/week  1 week       Prognosis Prognosis for improved oropharyngeal function: Fair      Swallow Study   General Date of Onset: 02/05/23 HPI: 82 y.o. female with a past medical history of CLL, ITP, left parietal ICH in 2021 presenting with aphasia and generalized weakness.  Swallow eval and speech eval ordered.  Per notes, pt was independent PTA and took care of her husband who has dementia. Type of Study: Bedside Swallow Evaluation Previous Swallow Assessment: none in epic Diet Prior to this Study: NPO Temperature Spikes Noted: No Respiratory Status: Nasal cannula History of Recent Intubation: No Behavior/Cognition: Lethargic/Drowsy;Doesn't follow directions Oral Cavity Assessment: Dry Oral Care Completed  by SLP: Other (Comment) (attempted but pt is resistant) Oral Cavity - Dentition: Adequate natural dentition Self-Feeding Abilities: Total assist Patient Positioning: Upright in bed Baseline Vocal Quality: Normal Volitional Cough: Other (Comment) Volitional Swallow: Unable to elicit     Oral/Motor/Sensory Function Overall Oral Motor/Sensory Function: Generalized oral weakness (she did not follow directions:)   Ice Chips Other Comments: pt did not open mouth adequately to accept small single ice chips   Thin Liquid Thin Liquid: Impaired    Nectar Thick Nectar Thick Liquid: Not tested   Honey Thick Honey Thick Liquid: Not tested   Puree Puree: Not tested   Solid     Solid: Not tested      Chales Abrahams 02/05/2023,9:54 AM  Rolena Infante, MS [facility] SLP Acute Rehab Services Office [phone]

## 2023-02-05 NOTE — Progress Notes (Signed)
PT Cancellation Note  Patient Details Name: Selena Branch MRN: [id number] DOB: [birth year]   Cancelled Treatment:    Reason Eval/Treat Not Completed: Patient's level of consciousness. Per chart review and discussion with RN, pt lethargic and not following commands, not appropriate for PT evaluation at this time. Acute PT will hold session and follow up later as appropriate/available. Thank you.    Leonie Man 02/05/2023, 2:39 PM

## 2023-02-05 NOTE — Progress Notes (Signed)
PROGRESS NOTE                                                                                                                                                                                                             Patient Demographics:    Selena Branch, is a 82 y.o. female, DOB - May 23, 1941, WUJ:[id number]  Outpatient Primary MD for the patient is Gweneth Dimitri, MD    LOS - 0  Admit date - 02/04/2023    Chief Complaint  Patient presents with   Code Stroke       Brief Narrative (HPI from H&P)   82 y.o. female with medical history significant of CLL currently in remission, glaucoma, hypertension, thrombocytopenia, hyperlipidemia presenting to the ED with sudden onset altered mental status/aphasia, this happened while she was on the phone at home, she was doing fine but suddenly became unresponsive and confused.  She was brought to the ER where she was seen by neurology and admitted for evaluation of expressive aphasia and possible stroke workup.   Subjective:    Denver Faster today in bed, extremely confused unable to answer questions or follow commands.   Assessment  & Plan :   #1  Metabolic encephalopathy, expressive aphasia.  Due to status epilepticus, MRI brain, CTA head and neck unremarkable, on Keppra, case discussed with neurology, currently postictal, placed on IV fluids, PT OT speech eval.  Further medication adjustment per neurology.  Family updated.   2.  Glaucoma  -resume home eyedrops.   3.  Hypertension -Patient aphasic, not opening her mouth or answering any questions. -Placed on as needed IV hydralazine   4.  Hyperlipidemia -Hold statin. -When more alert and tolerating oral intake we will resume statin.   5.  CLL/thrombocytopenia -Stable. -Outpatient follow-up with oncology.   6.  History of intracerebral hemorrhage -Stable.         Condition - Extremely Guarded  Family Communication  :  [name] [phone]  called and informed in detail on 02/05/2023  Code Status :  DNR  Consults  :  Neuro  PUD Prophylaxis : PPI   Procedures  :     MRI brain, CTA head and neck.  No stroke, no significant occlusion in any vessel.  EEG.  Positive for possible status epilepticus.      Disposition Plan  :  Status is: Observation  DVT Prophylaxis  :    SCDs Start: 02/04/23 1729    Lab Results  Component Value Date   PLT 90 (L) 02/05/2023    Diet :  Diet Order             Diet NPO time specified  Diet effective now                    Inpatient Medications  Scheduled Meds:  chlorhexidine gluconate (MEDLINE KIT)  15 mL Mouth Rinse BID   dorzolamide-timolol  1 drop Both Eyes Daily   enalaprilat  0.625 mg Intravenous Q6H   latanoprost  1 drop Both Eyes QHS   LORazepam  1 mg Intravenous Once   mouth rinse  15 mL Mouth Rinse 10 times per day   pantoprazole (PROTONIX) IV  40 mg Intravenous QHS   sodium chloride flush  3 mL Intravenous Q12H   Continuous Infusions:  dextrose 5 % and 0.45 % NaCl with KCl 10 mEq/L     levETIRAcetam     levETIRAcetam     PRN Meds:.acetaminophen **OR** acetaminophen, hydrALAZINE, LORazepam, ondansetron **OR** ondansetron (ZOFRAN) IV, polyethylene glycol, sorbitol  Antibiotics  :    Anti-infectives (From admission, onward)    None         Objective:   Vitals:   02/04/23 1730 02/04/23 1745 02/04/23 2000 02/05/23 0800  BP: (!) 149/59 136/61 (!) 159/68 (!) 147/74  Pulse: 71 67 70   Resp: 16 18 18  (!) 23  Temp:   98.2 F (36.8 C)   TempSrc:   Oral   SpO2: 100% 100% 100%   Weight:        Wt Readings from Last 3 Encounters:  02/04/23 49.2 kg  10/29/22 48.3 kg  07/30/22 48.8 kg     Intake/Output Summary (Last 24 hours) at 02/05/2023 0946 Last data filed at 02/05/2023 0331 Gross per 24 hour  Intake 890.61 ml  Output --  Net 890.61 ml     Physical Exam  Awake but confused, unable to answer questions  or follow commands, moving all 4 extremities by herself [place].AT,PERRAL Supple Neck, No JVD,   Symmetrical Chest wall movement, Good air movement bilaterally, CTAB RRR,No Gallops,Rubs or new Murmurs,  +ve B.Sounds, Abd Soft, No tenderness,   No Cyanosis, Clubbing or edema       Data Review:    Recent Labs  Lab 02/01/23 0923 02/04/23 1501 02/04/23 1508 02/05/23 0310  WBC 6.0 8.2  --  9.1  HGB 14.3 14.6 14.6 14.2  HCT 41.0 43.6 43.0 41.2  PLT 109* 116*  --  90*  MCV 93.0 93.0  --  92.4  MCH 32.4 31.1  --  31.8  MCHC 34.9 33.5  --  34.5  RDW 12.7 12.9  --  12.8  LYMPHSABS 2.8 4.3*  --  1.5  MONOABS 0.6 0.6  --  0.5  EOSABS 0.2 0.2  --  0.0  BASOSABS 0.0 0.0  --  0.0    Recent Labs  Lab 02/01/23 0923 02/04/23 1501 02/04/23 1508 02/05/23 0310 02/05/23 0621  NA 144 139 140 139  --   K 3.9 3.5 3.7 3.6  --   CL 107 104 104 106  --   CO2 29 26  --  22  --   ANIONGAP 8 9  --  11  --   GLUCOSE 108* 155* 155* 124*  --   BUN 18 15 17 14   --  CREATININE 0.81 0.99 1.00 0.76  --   AST 21 28  --  30  --   ALT 9 14  --  22  --   ALKPHOS 53 53  --  42  --   BILITOT 0.5 0.5  --  0.9  --   ALBUMIN 4.3 4.2  --  4.0  --   INR  --  1.0  --   --   --   TSH  --   --   --   --  0.618  MG  --   --   --  1.9  --   CALCIUM 9.3 9.5  --  8.6*  --      Lab Results  Component Value Date   CHOL 139 02/05/2023   HDL 64 02/05/2023   LDLCALC 69 02/05/2023   TRIG 31 02/05/2023   CHOLHDL 2.2 02/05/2023    No results found for: "HGBA1C"    Radiology Reports Overnight EEG with video  Result Date: 02/05/2023 Charlsie Quest, MD     02/05/2023  6:54 AM Patient Name: Selena Branch MRN: [id number] Epilepsy Attending: Charlsie Quest Referring Physician/Provider: Marjorie Smolder, NP Duration: 02/04/2023 1629 02/05/2023 0645 Patient history: 82 y.o. female with a past medical history of CLL, ITP, left parietal ICH in 2021 presenting with aphasia and generalized weakness. EEG to evaluate  for seizure. Level of alertness: Awake, asleep AEDs during EEG study: LEV Technical aspects: This EEG study was done with scalp electrodes positioned according to the 10-20 International system of electrode placement. Electrical activity was reviewed with band pass filter of 1-70Hz , sensitivity of 7 uV/mm, display speed of 79mm/sec with a 60Hz  notched filter applied as appropriate. EEG data were recorded continuously and digitally stored.  Video monitoring was available and reviewed as appropriate. Description: At the beginning of study, EEG showed lateralized periodic discharges in left hemisphere, maximal left parietal region at 2.5-3Hz  admixed with 3-5hz  theta-delta slowing and overriding 13-15hz  beta activity. Clinically patient was aphasic per neurologist. 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 Patient was loaded with Keppra  on 02/04/2023 at 1716 and subsequently status epilepticus resolved. After around 1830, EEG showed continuous generalized and lateralized left hemisphere 3 to 6 Hz theta-delta slowing. Hyperventilation and photic stimulation were not performed.   EEG was difficult to interpret after 1946 due to significant electrode artifact. ABNORMALITY - Focal electrographic status epilepticus, left hemisphere, maximal left parietal region - Continuous slow, generalized and lateralized left hemisphere IMPRESSION: This study initially showed focal electrographic status epilepticus arising from left hemisphere, maximal left parietal region. Clinically, per neurologist patient was aphasic. Patient was loaded with keppra  on 02/04/2023 at 1716 and subsequently status epilepticus resolved. EEG was then suggestive of cortical dysfunction arising from left hemisphere likely due to post-ictal state. Additionally there is moderate diffuse encephalopathy. Charlsie Quest   DG Chest Port 1 View  Result Date: 02/04/2023 CLINICAL DATA:  Altered mental status EXAM: PORTABLE  CHEST 1 VIEW COMPARISON:  CT 12/01/2020 FINDINGS: Low lung volumes. Patchy retrocardiac opacity. No pleural effusion. Normal cardiac size. Aortic atherosclerosis IMPRESSION: Low lung volumes with patchy retrocardiac opacity, atelectasis versus pneumonia. Electronically Signed   By: Jasmine Pang M.D.   On: 02/04/2023 17:22   MR BRAIN WO CONTRAST  Result Date: 02/04/2023 CLINICAL DATA:  Neuro deficit, acute, stroke suspected. EXAM: MRI HEAD WITHOUT CONTRAST TECHNIQUE: Multiplanar, multiecho pulse sequences of the brain and surrounding structures were obtained  without intravenous contrast. COMPARISON:  Head CT 02/04/2023 and older.  MRI brain 01/14/2016. FINDINGS: Brain: No acute infarct or hemorrhage. Sequela of prior hemorrhage in the left superior temporal gyrus. Old microhemorrhage in the posterior right temporal lobe. No hydrocephalus or extra-axial collection. No mass or midline shift. Vascular: Normal flow voids. Skull and upper cervical spine: Normal marrow signal. Sinuses/Orbits: Unremarkable. Other: None. IMPRESSION: No acute intracranial process. Electronically Signed   By: Orvan Falconer M.D.   On: 02/04/2023 16:10   CT ANGIO HEAD NECK W WO CM (CODE STROKE)  Result Date: 02/04/2023 CLINICAL DATA:  Neuro deficit, acute, stroke suspected right sided weakness, aphasia. EXAM: CT ANGIOGRAPHY HEAD AND NECK WITH AND WITHOUT CONTRAST TECHNIQUE: Multidetector CT imaging of the head and neck was performed using the standard protocol during bolus administration of intravenous contrast. Multiplanar CT image reconstructions and MIPs were obtained to evaluate the vascular anatomy. Carotid stenosis measurements (when applicable) are obtained utilizing NASCET criteria, using the distal internal carotid diameter as the denominator. RADIATION DOSE REDUCTION: This exam was performed according to the departmental dose-optimization program which includes automated exposure control, adjustment of the mA and/or kV  according to patient size and/or use of iterative reconstruction technique. CONTRAST:  50mL OMNIPAQUE IOHEXOL 350 MG/ML SOLN COMPARISON:  Head CT 02/04/2023.  MRI brain 01/14/2016. FINDINGS: CTA NECK FINDINGS Aortic arch: Three-vessel arch configuration. Atherosclerotic calcifications of the aortic arch and arch vessel origins. Arch vessel origins are patent. Right carotid system: No evidence of dissection, stenosis (50% or greater), or occlusion. Left carotid system: No evidence of dissection, stenosis (50% or greater), or occlusion. Vertebral arteries: Codominant. No evidence of dissection, stenosis (50% or greater), or occlusion. 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 Other neck: Unremarkable. Upper chest: Unremarkable. Review of the MIP images confirms the above findings CTA HEAD FINDINGS Anterior circulation: Intracranial ICAs are patent without stenosis or aneurysm. The proximal ACAs and MCAs are patent without stenosis or aneurysm. Distal branches are symmetric. Posterior circulation: Normal basilar artery. The SCAs, AICAs and PICAs are patent proximally. The PCAs are patent proximally without stenosis or aneurysm. Distal branches are symmetric. Venous sinuses: Patent. 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 Review of the MIP images confirms the above findings IMPRESSION: No large vessel occlusion, hemodynamically significant stenosis or aneurysm of the head or neck vessels. Aortic Atherosclerosis (ICD10-I70.0). Electronically Signed   By: Orvan Falconer M.D.   On: 02/04/2023 15:42   CT HEAD CODE STROKE WO CONTRAST  Result Date: 02/04/2023 CLINICAL DATA:  Code stroke. Neuro deficit, acute, stroke suspected no lateralizing information provided. EXAM: CT HEAD WITHOUT CONTRAST TECHNIQUE: Contiguous axial images were obtained from the base of the skull through the vertex without  intravenous contrast. RADIATION DOSE REDUCTION: This exam was performed according to the departmental dose-optimization program which includes automated exposure control, adjustment of the mA and/or kV according to patient size and/or use of iterative reconstruction technique. COMPARISON:  CT Head 11/14/19 FINDINGS: Brain: No evidence of acute infarction, hemorrhage, hydrocephalus, extra-axial collection or mass lesion/mass effect. Sequela of moderate chronic microvascular ischemic change. Unchanged small cystic lesion in the left temporal lobe (series 2, image 17), previously favored to represent a chronic evolving hemorrhage. Vascular: No hyperdense vessel or unexpected calcification. Skull: Normal. Negative for fracture or focal lesion. Sinuses/Orbits: No middle ear or mastoid effusion. Paranasal sinuses are clear. Orbits are notable for bilateral lens replacement, otherwise unremarkable.  Other: None. ASPECTS [facility] Stroke Program Early CT Score):: 10 IMPRESSION: No acute hemorrhage or CT evidence of an acute cortical infarct. Findings were paged to Dr. Pearlean Brownie on 02/04/23 at 3:17 PM via [facility] paging system. Electronically Signed   By: Lorenza Cambridge M.D.   On: 02/04/2023 15:17      Signature  -   Susa Raring M.D on 02/05/2023 at 9:46 AM   -  To page go to [email or web address]

## 2023-02-05 NOTE — Progress Notes (Signed)
Neurology Progress Note  Brief HPI: 82 year old patient with history of CLL, ITP and left parietal ICH in 2021 presented with aphasia and generalized weakness and was found to be in electrographic status epilepticus.  Head CT and brain MRI were negative for acute abnormalities.  She has no previous history of seizures, no family history of seizures, no history of brain surgery, no history of meningitis or encephalitis, and history of 1 fall in which she hit her head and had to have stitches but no other serious head injuries.  At baseline, she is alert and oriented, able to do all of her ADLs and cares for her husband.  Subjective: Patient is seen resting in bed.  She is largely nonverbal and unable to follow commands.  EEG leads have unfortunately become dislodged later night and are being re-applied this AM.  Exam: Vitals:   02/04/23 2000 02/05/23 0800  BP: (!) 159/68 (!) 147/74  Pulse: 70   Resp: 18 (!) 23  Temp: 98.2 F (36.8 C)   SpO2: 100%    Gen: In bed, NAD Resp: non-labored breathing, no acute distress Abd: soft, nt  Neuro: Mental Status: Patient is alert but does not respond to name and will not follow commands.  She will withdraw and say "ouch" or "pinch" to noxious stimuli Cranial Nerves: Pupils equal round and reactive, able to look to the left but not cross midline to the right, face symmetrical, tongue protrusion not cooperative Motor: Moves all 4 extremities nonpurposeful he Sensory: Intact to noxious stimuli throughout Gait: Deferred  Repeat Exam after keppra and ativan loads Drowsy, opens eyes, does not follow commands. Grimace to nox stim all over  Pertinent Labs:    Latest Ref Rng & Units 02/05/2023    3:10 AM 02/04/2023    3:08 PM 02/04/2023    3:01 PM  CBC  WBC 4.0 - 10.5 K/uL 9.1   8.2   Hemoglobin 12.0 - 15.0 g/dL 29.5  62.1  30.8   Hematocrit 36.0 - 46.0 % 41.2  43.0  43.6   Platelets 150 - 400 K/uL 90   116        Latest Ref Rng & Units 02/05/2023     3:10 AM 02/04/2023    3:08 PM 02/04/2023    3:01 PM  BMP  Glucose 70 - 99 mg/dL 657  846  962   BUN 8 - 23 mg/dL 14  17  15    Creatinine 0.44 - 1.00 mg/dL 9.52  8.41  3.24   Sodium 135 - 145 mmol/L 139  140  139   Potassium 3.5 - 5.1 mmol/L 3.6  3.7  3.5   Chloride 98 - 111 mmol/L 106  104  104   CO2 22 - 32 mmol/L 22   26   Calcium 8.9 - 10.3 mg/dL 8.6   9.5      Imaging Reviewed:  CT head: No acute abnormalities  CTA head and neck: No LVO or hemodynamically significant stenosis  MRI brain: No acute infarct, no acute abnormalities  Assessment: 82 year old patient with history of CLL, ITP and ICH presented with aphasia and generalized weakness.  Head CT and MRI were negative for acute abnormalities, and EEG revealed the patient was in electrographic status epilepticus.  This terminated after Keppra load.  However, EEG leads then became dislodged.  This morning, patient has not returned to baseline, is largely nonverbal and unable to follow commands.  Suspect ongoing seizure activity v prolonged post ictal state.  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 statutes, patients with seizures are not allowed to drive until they have been seizure-free for six months Use caution when using heavy equipment or power tools. Avoid working on ladders or at heights. Take showers instead of baths. Ensure the water temperature is not too high on the home water heater. Do not go swimming alone. Do not lock yourself in a room alone (i.e. bathroom). When caring for infants or small children, sit down when holding,  feeding, or changing them to minimize risk of injury to the child in the event you have a seizure. Maintain good sleep hygiene. Avoid alcohol.    Cortney E Ernestina Columbia , MSN, AGACNP-BC [facility] See Amion for schedule and pager information 02/05/2023 9:49 AM    Attending Neurohospitalist Addendum Patient seen and examined with APP/Resident. Agree with the history and physical as documented above. Agree with the plan as documented, which I helped formulate. I have independently reviewed the chart, obtained history, review of systems and examined the patient.I have personally reviewed pertinent head/neck/spine imaging (CT/MRI). Please feel free to call with any questions.  -- Milon Dikes, MD Neurologist [facility] Pager: [phone]

## 2023-02-05 NOTE — Plan of Care (Signed)

## 2023-02-06 DIAGNOSIS — R569 Unspecified convulsions: Secondary | ICD-10-CM | POA: Diagnosis not present

## 2023-02-06 LAB — URINALYSIS, ROUTINE W REFLEX MICROSCOPIC
Bilirubin Urine: NEGATIVE
Glucose, UA: NEGATIVE mg/dL
Ketones, ur: NEGATIVE mg/dL
Nitrite: NEGATIVE
Protein, ur: NEGATIVE mg/dL
Specific Gravity, Urine: 1.013 (ref 1.005–1.030)
pH: 6 (ref 5.0–8.0)

## 2023-02-06 LAB — CBC WITH DIFFERENTIAL/PLATELET
Abs Immature Granulocytes: 0.02 10*3/uL (ref 0.00–0.07)
Basophils Absolute: 0 10*3/uL (ref 0.0–0.1)
Basophils Relative: 0 %
Eosinophils Absolute: 0.1 10*3/uL (ref 0.0–0.5)
Eosinophils Relative: 1 %
HCT: 37.7 % (ref 36.0–46.0)
Hemoglobin: 12.7 g/dL (ref 12.0–15.0)
Immature Granulocytes: 0 %
Lymphocytes Relative: 35 %
Lymphs Abs: 3.1 10*3/uL (ref 0.7–4.0)
MCH: 31 pg (ref 26.0–34.0)
MCHC: 33.7 g/dL (ref 30.0–36.0)
MCV: 92 fL (ref 80.0–100.0)
Monocytes Absolute: 0.7 10*3/uL (ref 0.1–1.0)
Monocytes Relative: 8 %
Neutro Abs: 4.9 10*3/uL (ref 1.7–7.7)
Neutrophils Relative %: 56 %
Platelets: 101 10*3/uL — ABNORMAL LOW (ref 150–400)
RBC: 4.1 MIL/uL (ref 3.87–5.11)
RDW: 12.9 % (ref 11.5–15.5)
WBC: 8.8 10*3/uL (ref 4.0–10.5)
nRBC: 0 % (ref 0.0–0.2)

## 2023-02-06 LAB — BASIC METABOLIC PANEL
Anion gap: 7 (ref 5–15)
BUN: 9 mg/dL (ref 8–23)
CO2: 24 mmol/L (ref 22–32)
Calcium: 7.8 mg/dL — ABNORMAL LOW (ref 8.9–10.3)
Chloride: 106 mmol/L (ref 98–111)
Creatinine, Ser: 0.66 mg/dL (ref 0.44–1.00)
GFR, Estimated: >60 mL/min (ref >60)
Glucose, Bld: 111 mg/dL — ABNORMAL HIGH (ref 70–99)
Potassium: 3.3 mmol/L — ABNORMAL LOW (ref 3.5–5.1)
Sodium: 137 mmol/L (ref 135–145)

## 2023-02-06 LAB — RAPID URINE DRUG SCREEN, HOSP PERFORMED
Amphetamines: NOT DETECTED
Barbiturates: NOT DETECTED
Benzodiazepines: POSITIVE — AB
Cocaine: NOT DETECTED
Opiates: NOT DETECTED
Tetrahydrocannabinol: NOT DETECTED

## 2023-02-06 LAB — MAGNESIUM: Magnesium: 2.1 mg/dL (ref 1.7–2.4)

## 2023-02-06 LAB — BRAIN NATRIURETIC PEPTIDE: B Natriuretic Peptide: 286.2 pg/mL — ABNORMAL HIGH (ref 0.0–100.0)

## 2023-02-06 MED ORDER — DEXTROSE-SODIUM CHLORIDE 5-0.45 % IV SOLN: INTRAVENOUS | Status: DC

## 2023-02-06 MED ORDER — SODIUM CHLORIDE 0.9 % IV SOLN
200.0000 mg | Freq: Two times a day (BID) | INTRAVENOUS | Status: DC
  Administered 2023-02-06 – 2023-02-23 (×35): 200 mg via INTRAVENOUS
  Filled 2023-02-06 (×36): qty 20

## 2023-02-06 MED ORDER — POTASSIUM CHLORIDE 10 MEQ/100ML IV SOLN
10.0000 meq | INTRAVENOUS | Status: AC
  Administered 2023-02-06 (×3): 10 meq via INTRAVENOUS
  Filled 2023-02-06 (×3): qty 100

## 2023-02-06 MED ORDER — CHLORHEXIDINE GLUCONATE CLOTH 2 % EX PADS
6.0000 | MEDICATED_PAD | Freq: Every day | CUTANEOUS | Status: DC
  Administered 2023-02-06 – 2023-02-27 (×20): 6 via TOPICAL

## 2023-02-06 NOTE — Progress Notes (Signed)
Neurology Progress Note  Brief HPI: 82 year old patient with history of CLL, ITP and left parietal ICH in 2021 presented with aphasia and generalized weakness and was found to be in electrographic status epilepticus.  Head CT and brain MRI were negative for acute abnormalities.  She has no previous history of seizures, no family history of seizures, no history of brain surgery, no history of meningitis or encephalitis, and history of 1 fall in which she hit her head and had to have stitches but no other serious head injuries.  At baseline, she is alert and oriented, able to do all of her ADLs and cares for her husband.  Subjective: Patient is seen resting in bed.  She is able to form sentences today, but they do not make any sense.  Overnight EEG reveals 9 seizures without clinical signs, as well as LPD's over the left hemisphere.  Exam: Vitals:   02/05/23 1600 02/05/23 2000  BP: (!) 124/53   Pulse: 62   Resp: 20   Temp: 98.2 F (36.8 C) 98.4 F (36.9 C)  SpO2: 94%    Gen: In bed, NAD Resp: non-labored breathing, no acute distress Abd: soft, nt  Neuro: Comfortably sleeping in bed.  Awakens to voice. Does not follow commands Mumble some sentences with comprehensible words but the sentence does not make any sense. Cranial nerves II to XII grossly intact Motor examination-moving all 4 extremities somewhat to noxious stimulation.  Moving left upper extremity more than right upper extremity spontaneously but that might also have been positional. Sensation: Grimaces and withdraws to noxious stimulation equally. Coordination difficult to assess   Pertinent Labs:    Latest Ref Rng & Units 02/06/2023    4:00 AM 02/05/2023    3:10 AM 02/04/2023    3:08 PM  CBC  WBC 4.0 - 10.5 K/uL 8.8  9.1    Hemoglobin 12.0 - 15.0 g/dL 16.1  09.6  04.5   Hematocrit 36.0 - 46.0 % 37.7  41.2  43.0   Platelets 150 - 400 K/uL 101  90         Latest Ref Rng & Units 02/06/2023    4:00 AM 02/05/2023    3:10  AM 02/04/2023    3:08 PM  BMP  Glucose 70 - 99 mg/dL 409  811  914   BUN 8 - 23 mg/dL 9  14  17    Creatinine 0.44 - 1.00 mg/dL 7.82  9.56  2.13   Sodium 135 - 145 mmol/L 137  139  140   Potassium 3.5 - 5.1 mmol/L 3.3  3.6  3.7   Chloride 98 - 111 mmol/L 106  106  104   CO2 22 - 32 mmol/L 24  22    Calcium 8.9 - 10.3 mg/dL 7.8  8.6       Imaging Reviewed:  CT head: No acute abnormalities  CTA head and neck: No LVO or hemodynamically significant stenosis  MRI brain: No acute infarct, no acute abnormalities  EEG 5/26: 9 seizures without clinical signs arising from left hemisphere overnight, lasting about 10 seconds each, LPD's in left hemisphere  EEG 5/25: Focal electrographic status epilepticus in left hemisphere, maximal left parietal region which terminated when patient was loaded with Keppra, LPD's left hemisphere  Assessment: 82 year old patient with history of CLL, ITP and ICH presented with aphasia and generalized weakness.  Head CT and MRI were negative for acute abnormalities, and EEG revealed the patient was in electrographic status epilepticus.  This terminated after  Keppra load.  However, EEG leads then became dislodged.  This morning, patient has not returned to baseline, is largely nonverbal and unable to follow commands.  Suspect ongoing seizure activity v prolonged post ictal state.  EEG shows ongoing seizure activity and LPD's arising from left hemisphere, parietal lobe.  This is the area where her previous ICH was.  Patient is now on Keppra 1500 mg twice daily, and Vimpat has been increased to 200 mg twice daily  Impression: New onset seizure with a history of ICH, status epilepticus  Recommendations: 1) continue long-term EEG 2) continue maintenance Keppra at 1500 mg twice daily 3) increase Vimpat 200 mg twice daily from 100 mg twice daily.  Next step is to add phenobarbital. 4) maintain seizure precautions 5) Continue LTM 6) Will discuss [facility] statutes,  patients with seizures are not allowed to drive until they have been seizure-free for six months Use caution when using heavy equipment or power tools. Avoid working on ladders or at heights. Take showers instead of baths. Ensure the water temperature is not too high on the home water heater. Do not go swimming alone. Do not lock yourself in a room alone (i.e. bathroom). When caring for infants or small children, sit down when holding, feeding, or changing them to minimize risk of injury to the child in the event you have a seizure. Maintain good sleep hygiene. Avoid alcohol.    Cortney E Ernestina Columbia , MSN, AGACNP-BC [facility] See Amion for schedule and pager information 02/06/2023 10:08 AM   Attending Neurohospitalist Addendum Patient seen and examined with APP/Resident. Agree with the history and physical as documented above. Agree with the plan as documented, which I helped formulate. I have independently reviewed the chart, obtained history, review of systems and examined the patient.I have personally reviewed pertinent head/neck/spine imaging (CT/MRI).  Plan discussed with Dr. Thedore Mins as well as patient's daughter and husband at bedside Please feel free to call with any questions.  -- Milon Dikes, MD Neurologist [facility] Pager: [phone]

## 2023-02-06 NOTE — Progress Notes (Signed)
Pt pulled two leads of EEG(On Wrist restraints,mittens).EEG technician paged,is not reachable,message left.Wrist restraints and mittens are still on place.

## 2023-02-06 NOTE — Progress Notes (Signed)
PT Cancellation Note  Patient Details Name: Selena Branch MRN: [id number] DOB: [birth year]   Cancelled Treatment:    Reason Eval/Treat Not Completed: Patient not medically ready; noted with ongoing seizures and monitoring.  Per MD will hold PT today.   Elray Mcgregor 02/06/2023, 3:30 PM Sheran Lawless, PT Acute Rehabilitation Services Office:[phone] 02/06/2023

## 2023-02-06 NOTE — Progress Notes (Signed)
PROGRESS NOTE                                                                                                                                                                                                             Patient Demographics:    Selena Branch, is a 82 y.o. female, DOB - [birth year], GNF:[id number]  Outpatient Primary MD for the patient is Gweneth Dimitri, MD    LOS - 1  Admit date - 02/04/2023    Chief Complaint  Patient presents with   Code Stroke       Brief Narrative (HPI from H&P)   82 y.o. female with medical history significant of CLL currently in remission, glaucoma, hypertension, thrombocytopenia, hyperlipidemia presenting to the ED with sudden onset altered mental status/aphasia, this happened while she was on the phone at home, she was doing fine but suddenly became unresponsive and confused.  She was brought to the ER where she was seen by neurology and admitted for evaluation of expressive aphasia and possible stroke workup.   Subjective:    Denver Faster today in bed, extremely confused unable to answer questions or follow commands.   Assessment  & Plan :   #1  Metabolic encephalopathy, expressive aphasia.  Due to status epilepticus, MRI brain, CTA head and neck unremarkable, on Keppra & Vimpat, case discussed with neurology 02/07/23, placed on IV fluids, PT, OT speech eval.  Further medication adjustment per neurology, may need LP if seizures remain out of control with her underlying history of CLL.      2.  Glaucoma   - resume home eyedrops.   3.  Hypertension  - Patient aphasic, not opening her mouth or answering any questions. Placed on as needed IV hydralazine.   4.  Hyperlipidemia  - Hold statin.  When more alert and tolerating oral intake we will resume statin.   5.  CLL/thrombocytopenia  - Stable.  Outpatient follow-up with oncology.   6.  History of intracerebral hemorrhage  - Stable.          Condition - Extremely Guarded  Family Communication  : [name] [phone]  called and informed in detail on 02/05/2023  Code Status :  DNR  Consults  :  Neuro  PUD Prophylaxis : PPI   Procedures  :     MRI brain, CTA head  and neck.  No stroke, no significant occlusion in any vessel.  EEG.  Positive for possible status epilepticus.      Disposition Plan  :    Status is: Observation  DVT Prophylaxis  :    heparin injection 5,000 Units Start: 02/05/23 1400 SCDs Start: 02/04/23 1729    Lab Results  Component Value Date   PLT 101 (L) 02/06/2023    Diet :  Diet Order             Diet NPO time specified Except for: Sips with Meds  Diet effective now                    Inpatient Medications  Scheduled Meds:  amLODipine  10 mg Oral QHS   atorvastatin  40 mg Oral Daily   chlorhexidine gluconate (MEDLINE KIT)  15 mL Mouth Rinse BID   dorzolamide-timolol  1 drop Both Eyes Daily   heparin injection (subcutaneous)  5,000 Units Subcutaneous Q8H   latanoprost  1 drop Both Eyes QHS   mouth rinse  15 mL Mouth Rinse 10 times per day   pantoprazole (PROTONIX) IV  40 mg Intravenous QHS   sertraline  25 mg Oral Daily   sodium chloride flush  3 mL Intravenous Q12H   venetoclax  200 mg Oral Daily   Continuous Infusions:  dextrose 5 % and 0.45 % NaCl 75 mL/hr at 02/06/23 0936   lacosamide (VIMPAT) IV 200 mg (02/06/23 0939)   levETIRAcetam Stopped (02/05/23 2300)   PRN Meds:.acetaminophen **OR** acetaminophen, hydrALAZINE, LORazepam, ondansetron **OR** ondansetron (ZOFRAN) IV, polyethylene glycol, sorbitol  Antibiotics  :    Anti-infectives (From admission, onward)    None         Objective:   Vitals:   02/05/23 1200 02/05/23 1305 02/05/23 1600 02/05/23 2000  BP: (!) 156/64  (!) 124/53   Pulse: 67  62   Resp: 18  20   Temp: 98.7 F (37.1 C) 98.7 F (37.1 C) 98.2 F (36.8 C) 98.4 F (36.9 C)  TempSrc:  Oral Axillary Oral  SpO2: 98%   94%   Weight:        Wt Readings from Last 3 Encounters:  02/04/23 49.2 kg  10/29/22 48.3 kg  07/30/22 48.8 kg     Intake/Output Summary (Last 24 hours) at 02/06/2023 0959 Last data filed at 02/05/2023 1458 Gross per 24 hour  Intake 323.71 ml  Output 900 ml  Net -576.29 ml     Physical Exam  Awake but confused, unable to answer questions or follow commands, moving all 4 extremities by herself [place].AT,PERRAL Supple Neck, No JVD,   Symmetrical Chest wall movement, Good air movement bilaterally, CTAB RRR,No Gallops,Rubs or new Murmurs,  +ve B.Sounds, Abd Soft, No tenderness,   No Cyanosis, Clubbing or edema       Data Review:    Recent Labs  Lab 02/01/23 0923 02/04/23 1501 02/04/23 1508 02/05/23 0310 02/06/23 0400  WBC 6.0 8.2  --  9.1 8.8  HGB 14.3 14.6 14.6 14.2 12.7  HCT 41.0 43.6 43.0 41.2 37.7  PLT 109* 116*  --  90* 101*  MCV 93.0 93.0  --  92.4 92.0  MCH 32.4 31.1  --  31.8 31.0  MCHC 34.9 33.5  --  34.5 33.7  RDW 12.7 12.9  --  12.8 12.9  LYMPHSABS 2.8 4.3*  --  1.5 3.1  MONOABS 0.6 0.6  --  0.5 0.7  EOSABS 0.2 0.2  --  0.0 0.1  BASOSABS 0.0 0.0  --  0.0 0.0    Recent Labs  Lab 02/01/23 0923 02/04/23 1501 02/04/23 1508 02/05/23 0310 02/05/23 0621 02/06/23 0400  NA 144 139 140 139  --  137  K 3.9 3.5 3.7 3.6  --  3.3*  CL 107 104 104 106  --  106  CO2 29 26  --  22  --  24  ANIONGAP 8 9  --  11  --  7  GLUCOSE 108* 155* 155* 124*  --  111*  BUN 18 15 17 14   --  9  CREATININE 0.81 0.99 1.00 0.76  --  0.66  AST 21 28  --  30  --   --   ALT 9 14  --  22  --   --   ALKPHOS 53 53  --  42  --   --   BILITOT 0.5 0.5  --  0.9  --   --   ALBUMIN 4.3 4.2  --  4.0  --   --   INR  --  1.0  --   --   --   --   TSH  --   --   --   --  0.618  --   BNP  --   --   --   --   --  286.2*  MG  --   --   --  1.9  --  2.1  CALCIUM 9.3 9.5  --  8.6*  --  7.8*     Lab Results  Component Value Date   CHOL 139 02/05/2023   HDL 64 02/05/2023   LDLCALC 69  02/05/2023   TRIG 31 02/05/2023   CHOLHDL 2.2 02/05/2023    No results found for: "HGBA1C"    Radiology Reports Overnight EEG with video  Result Date: 02/05/2023 Charlsie Quest, MD     02/06/2023  6:59 AM Patient Name: Selena Branch MRN: [id number] Epilepsy Attending: Charlsie Quest Referring Physician/Provider: Marjorie Smolder, NP Duration: 02/04/2023 1629 02/05/2023 1629 Patient history: 82 y.o. female with a past medical history of CLL, ITP, left parietal ICH in 2021 presenting with aphasia and generalized weakness. EEG to evaluate for seizure. Level of alertness: Awake, asleep AEDs during EEG study: LEV Technical aspects: This EEG study was done with scalp electrodes positioned according to the 10-20 International system of electrode placement. Electrical activity was reviewed with band pass filter of 1-70Hz , sensitivity of 7 uV/mm, display speed of 72mm/sec with a 60Hz  notched filter applied as appropriate. EEG data were recorded continuously and digitally stored.  Video monitoring was available and reviewed as appropriate. Description: At the beginning of study, EEG showed lateralized periodic discharges in left hemisphere, maximal left parietal region at 2.5-3Hz  admixed with 3-5hz  theta-delta slowing and overriding 13-15hz  beta activity. Clinically patient was aphasic per neurologist. 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 Patient was loaded with Keppra  on 02/04/2023 at 1716 and subsequently status epilepticus resolved. After around 1830, EEG showed continuous generalized and lateralized left hemisphere 3 to 6 Hz theta-delta slowing. Lateralized periodic discharges at 0.5-1hz  were also noted arising from left hemisphere. Hyperventilation and photic stimulation were not performed.   EEG was difficult to interpret between 02/04/2023 1946 to 02/05/2023 1010 due to significant electrode artifact. ABNORMALITY - Focal electrographic status epilepticus, left  hemisphere, maximal left parietal region - Lateralized periodic discharges, left hemisphere - Continuous slow, generalized  and lateralized left hemisphere IMPRESSION: This study initially showed focal electrographic status epilepticus arising from left hemisphere, maximal left parietal region. Clinically, per neurologist patient was aphasic. Patient was loaded with keppra  on 02/04/2023 at 1716 and subsequently status epilepticus resolved. EEG was then suggestive of epileptogenicity and cortical dysfunction arising from left hemisphere likely secondary to underlying structural abnormality and increased risk of seizure recurrence. Lastly there is moderate diffuse encephalopathy. Charlsie Quest   DG Chest Port 1 View  Result Date: 02/04/2023 CLINICAL DATA:  Altered mental status EXAM: PORTABLE CHEST 1 VIEW COMPARISON:  CT 12/01/2020 FINDINGS: Low lung volumes. Patchy retrocardiac opacity. No pleural effusion. Normal cardiac size. Aortic atherosclerosis IMPRESSION: Low lung volumes with patchy retrocardiac opacity, atelectasis versus pneumonia. Electronically Signed   By: Jasmine Pang M.D.   On: 02/04/2023 17:22   MR BRAIN WO CONTRAST  Result Date: 02/04/2023 CLINICAL DATA:  Neuro deficit, acute, stroke suspected. EXAM: MRI HEAD WITHOUT CONTRAST TECHNIQUE: Multiplanar, multiecho pulse sequences of the brain and surrounding structures were obtained without intravenous contrast. COMPARISON:  Head CT 02/04/2023 and older.  MRI brain 01/14/2016. FINDINGS: Brain: No acute infarct or hemorrhage. Sequela of prior hemorrhage in the left superior temporal gyrus. Old microhemorrhage in the posterior right temporal lobe. No hydrocephalus or extra-axial collection. No mass or midline shift. Vascular: Normal flow voids. Skull and upper cervical spine: Normal marrow signal. Sinuses/Orbits: Unremarkable. Other: None. IMPRESSION: No acute intracranial process. Electronically Signed   By: Orvan Falconer M.D.   On: 02/04/2023  16:10   CT ANGIO HEAD NECK W WO CM (CODE STROKE)  Result Date: 02/04/2023 CLINICAL DATA:  Neuro deficit, acute, stroke suspected right sided weakness, aphasia. EXAM: CT ANGIOGRAPHY HEAD AND NECK WITH AND WITHOUT CONTRAST TECHNIQUE: Multidetector CT imaging of the head and neck was performed using the standard protocol during bolus administration of intravenous contrast. Multiplanar CT image reconstructions and MIPs were obtained to evaluate the vascular anatomy. Carotid stenosis measurements (when applicable) are obtained utilizing NASCET criteria, using the distal internal carotid diameter as the denominator. RADIATION DOSE REDUCTION: This exam was performed according to the departmental dose-optimization program which includes automated exposure control, adjustment of the mA and/or kV according to patient size and/or use of iterative reconstruction technique. CONTRAST:  50mL OMNIPAQUE IOHEXOL 350 MG/ML SOLN COMPARISON:  Head CT 02/04/2023.  MRI brain 01/14/2016. FINDINGS: CTA NECK FINDINGS Aortic arch: Three-vessel arch configuration. Atherosclerotic calcifications of the aortic arch and arch vessel origins. Arch vessel origins are patent. Right carotid system: No evidence of dissection, stenosis (50% or greater), or occlusion. Left carotid system: No evidence of dissection, stenosis (50% or greater), or occlusion. Vertebral arteries: Codominant. No evidence of dissection, stenosis (50% or greater), or occlusion. 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 Other neck: Unremarkable. Upper chest: Unremarkable. Review of the MIP images confirms the above findings CTA HEAD FINDINGS Anterior circulation: Intracranial ICAs are patent without stenosis or aneurysm. The proximal ACAs and MCAs are patent without stenosis or aneurysm. Distal branches are symmetric. Posterior circulation: Normal basilar artery. The SCAs, AICAs and PICAs are patent  proximally. The PCAs are patent proximally without stenosis or aneurysm. Distal branches are symmetric. Venous sinuses: Patent. 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 Review of the MIP images confirms the above findings IMPRESSION: No large vessel occlusion, hemodynamically significant stenosis or aneurysm of the head or neck vessels. Aortic Atherosclerosis (ICD10-I70.0). Electronically Signed  By: Orvan Falconer M.D.   On: 02/04/2023 15:42   CT HEAD CODE STROKE WO CONTRAST  Result Date: 02/04/2023 CLINICAL DATA:  Code stroke. Neuro deficit, acute, stroke suspected no lateralizing information provided. EXAM: CT HEAD WITHOUT CONTRAST TECHNIQUE: Contiguous axial images were obtained from the base of the skull through the vertex without intravenous contrast. RADIATION DOSE REDUCTION: This exam was performed according to the departmental dose-optimization program which includes automated exposure control, adjustment of the mA and/or kV according to patient size and/or use of iterative reconstruction technique. COMPARISON:  CT Head 11/14/19 FINDINGS: Brain: No evidence of acute infarction, hemorrhage, hydrocephalus, extra-axial collection or mass lesion/mass effect. Sequela of moderate chronic microvascular ischemic change. Unchanged small cystic lesion in the left temporal lobe (series 2, image 17), previously favored to represent a chronic evolving hemorrhage. Vascular: No hyperdense vessel or unexpected calcification. Skull: Normal. Negative for fracture or focal lesion. Sinuses/Orbits: No middle ear or mastoid effusion. Paranasal sinuses are clear. Orbits are notable for bilateral lens replacement, otherwise unremarkable. Other: None. ASPECTS [facility] Stroke Program Early CT Score):: 10 IMPRESSION: No acute hemorrhage or CT evidence of an acute cortical infarct. Findings were paged to Dr. Pearlean Brownie on 02/04/23 at 3:17 PM via [facility] paging system. Electronically  Signed   By: Lorenza Cambridge M.D.   On: 02/04/2023 15:17      Signature  -   Susa Raring M.D on 02/06/2023 at 9:59 AM   -  To page go to [email or web address]

## 2023-02-06 NOTE — Progress Notes (Signed)
Maint pt again. Informed night RN Laverna Peace the importance of keeping EEG leads on. That NP Marisue Ivan informed tech that pt is having multiple subclinicla SZ. Asked him to keep an eye out and to please help make sure restraints and mitts stay on py. Tech wrapped head with 2 headwraps trying to maintain electrodes.

## 2023-02-06 NOTE — Progress Notes (Signed)
OT Cancellation Note  Patient Details Name: Selena Branch MRN: [id number] DOB: 30-Aug-1941   Cancelled Treatment:    Reason Eval/Treat Not Completed: Medical issues which prohibited therapy. Pt with 9 noted seizures overnight and still with continuous EEG. Secure chat with Dr. Thedore Mins about seeing patient today with response to wait until tomorrow.  [name] OT Acute Rehabilitation Services Office [phone]    Evette Georges 02/06/2023, 2:22 PM

## 2023-02-06 NOTE — Plan of Care (Signed)
  Problem: Education: Goal: Expressions of having a comfortable level of knowledge regarding the disease process will increase Outcome: Progressing   Problem: Coping: Goal: Ability to adjust to condition or change in health will improve Outcome: Progressing Goal: Ability to identify appropriate support needs will improve Outcome: Progressing   Problem: Health Behavior/Discharge Planning: Goal: Compliance with prescribed medication regimen will improve Outcome: Progressing   Problem: Medication: Goal: Risk for medication side effects will decrease Outcome: Progressing   Problem: Clinical Measurements: Goal: Complications related to the disease process, condition or treatment will be avoided or minimized Outcome: Progressing Goal: Diagnostic test results will improve Outcome: Progressing   Problem: Safety: Goal: Verbalization of understanding the information provided will improve Outcome: Progressing   Problem: Self-Concept: Goal: Level of anxiety will decrease Outcome: Progressing Goal: Ability to verbalize feelings about condition will improve Outcome: Progressing   Problem: Education: Goal: Knowledge of General Education information will improve Description: Including pain rating scale, medication(s)/side effects and non-pharmacologic comfort measures Outcome: Progressing   Problem: Health Behavior/Discharge Planning: Goal: Ability to manage health-related needs will improve Outcome: Progressing   Problem: Clinical Measurements: Goal: Ability to maintain clinical measurements within normal limits will improve Outcome: Progressing Goal: Will remain free from infection Outcome: Progressing Goal: Diagnostic test results will improve Outcome: Progressing Goal: Respiratory complications will improve Outcome: Progressing Goal: Cardiovascular complication will be avoided Outcome: Progressing   Problem: Activity: Goal: Risk for activity intolerance will  decrease Outcome: Progressing   Problem: Nutrition: Goal: Adequate nutrition will be maintained Outcome: Progressing   Problem: Coping: Goal: Level of anxiety will decrease Outcome: Progressing   Problem: Elimination: Goal: Will not experience complications related to bowel motility Outcome: Progressing Goal: Will not experience complications related to urinary retention Outcome: Progressing   Problem: Pain Managment: Goal: General experience of comfort will improve Outcome: Progressing   Problem: Safety: Goal: Ability to remain free from injury will improve Outcome: Progressing   Problem: Skin Integrity: Goal: Risk for impaired skin integrity will decrease Outcome: Progressing   Problem: Safety: Goal: Non-violent Restraint(s) Outcome: Progressing   

## 2023-02-06 NOTE — Progress Notes (Signed)
EEG maint complete. Wrapped head because pt keep pulling leads off with restraints in place. Informed RN that mitts need to be placed back on pt, as she is picking leads off and we do  not have hospital staff Sunday evenings to replace leads

## 2023-02-06 NOTE — Procedures (Signed)
Patient Name: Selena Branch  MRN: [id number]  Epilepsy Attending: Charlsie Quest  Referring Physician/Provider: Marjorie Smolder, NP  Duration: 02/05/2023 1629 02/06/2023 1629   Patient history: 82 y.o. female with a past medical history of CLL, ITP, left parietal ICH in 2021 presenting with aphasia and generalized weakness. EEG to evaluate for seizure.   Level of alertness: lethargic   AEDs during EEG study: LEV, LCM   Technical aspects: This EEG study was done with scalp electrodes positioned according to the 10-20 International system of electrode placement. Electrical activity was reviewed with band pass filter of 1-70Hz , sensitivity of 7 uV/mm, display speed of 36mm/sec with a 60Hz  notched filter applied as appropriate. EEG data were recorded continuously and digitally stored.  Video monitoring was available and reviewed as appropriate.   Description:  EEG showed continuous generalized and lateralized left hemisphere 3 to 6 Hz theta-delta slowing. Lateralized periodic discharges ( LPDs) at 0.5-1hz  were also noted arising from left hemisphere. At times the LPDs appeared rhythmic lasting 3-8 seconds consistent with brief ictal-interictal rhythmic discharges ( BIRDs)  Nine seizures without clinical signs were noted between 02/05/2023 2258 to 02/06/2023 0008.  EEG showed lateralized periodic discharges in left hemisphere, maximal left parietal region at 2.5-3Hz  admixed with 3-5hz  theta-delta slowing. Seizures lasted for about 10 seconds each.  Hyperventilation and photic stimulation were not performed.     ABNORMALITY - Seizures without clinical signs, left hemisphere, maximal left parietal region - Lateralized periodic discharges, left hemisphere - Continuous slow, generalized and lateralized left hemisphere   IMPRESSION: This study showed nine seizures without clinical signs arising from left hemisphere, maximal left parietal region, noted between 02/05/2023 2258 to 02/06/2023 0008,  lasted for about 10 seconds each. Last seizure was noted on 02/06/2023 at 0008.  EEG was also suggestive of epileptogenicity and cortical dysfunction arising from left hemisphere likely secondary to underlying structural abnormality and increased risk of seizure recurrence. Lastly there is moderate diffuse encephalopathy.    Ashe Gago [name]

## 2023-02-07 DIAGNOSIS — R569 Unspecified convulsions: Secondary | ICD-10-CM | POA: Diagnosis not present

## 2023-02-07 LAB — CBC WITH DIFFERENTIAL/PLATELET
Abs Immature Granulocytes: 0.03 10*3/uL (ref 0.00–0.07)
Basophils Absolute: 0 10*3/uL (ref 0.0–0.1)
Basophils Relative: 0 %
Eosinophils Absolute: 0.2 10*3/uL (ref 0.0–0.5)
Eosinophils Relative: 3 %
HCT: 38.4 % (ref 36.0–46.0)
Hemoglobin: 13.2 g/dL (ref 12.0–15.0)
Immature Granulocytes: 0 %
Lymphocytes Relative: 26 %
Lymphs Abs: 2.1 10*3/uL (ref 0.7–4.0)
MCH: 31.7 pg (ref 26.0–34.0)
MCHC: 34.4 g/dL (ref 30.0–36.0)
MCV: 92.1 fL (ref 80.0–100.0)
Monocytes Absolute: 0.7 10*3/uL (ref 0.1–1.0)
Monocytes Relative: 8 %
Neutro Abs: 5.1 10*3/uL (ref 1.7–7.7)
Neutrophils Relative %: 63 %
Platelets: 94 10*3/uL — ABNORMAL LOW (ref 150–400)
RBC: 4.17 MIL/uL (ref 3.87–5.11)
RDW: 12.5 % (ref 11.5–15.5)
WBC: 8.1 10*3/uL (ref 4.0–10.5)
nRBC: 0 % (ref 0.0–0.2)

## 2023-02-07 LAB — BASIC METABOLIC PANEL
Anion gap: 8 (ref 5–15)
BUN: 5 mg/dL — ABNORMAL LOW (ref 8–23)
CO2: 23 mmol/L (ref 22–32)
Calcium: 7.7 mg/dL — ABNORMAL LOW (ref 8.9–10.3)
Chloride: 108 mmol/L (ref 98–111)
Creatinine, Ser: 0.6 mg/dL (ref 0.44–1.00)
GFR, Estimated: >60 mL/min (ref >60)
Glucose, Bld: 119 mg/dL — ABNORMAL HIGH (ref 70–99)
Potassium: 2.9 mmol/L — ABNORMAL LOW (ref 3.5–5.1)
Sodium: 139 mmol/L (ref 135–145)

## 2023-02-07 LAB — BRAIN NATRIURETIC PEPTIDE: B Natriuretic Peptide: 154.1 pg/mL — ABNORMAL HIGH (ref 0.0–100.0)

## 2023-02-07 LAB — MAGNESIUM: Magnesium: 1.9 mg/dL (ref 1.7–2.4)

## 2023-02-07 MED ORDER — ELTROMBOPAG OLAMINE 25 MG PO TABS: 25.0000 mg | ORAL_TABLET | ORAL | Status: DC

## 2023-02-07 MED ORDER — POTASSIUM CHLORIDE 20 MEQ PO PACK
40.0000 meq | PACK | Freq: Once | ORAL | Status: DC
  Filled 2023-02-07: qty 2

## 2023-02-07 MED ORDER — CARVEDILOL 3.125 MG PO TABS
3.1250 mg | ORAL_TABLET | Freq: Two times a day (BID) | ORAL | Status: DC
  Administered 2023-02-08 – 2023-02-12 (×9): 3.125 mg via ORAL
  Filled 2023-02-07 (×10): qty 1

## 2023-02-07 MED ORDER — SODIUM CHLORIDE 0.9 % IV SOLN
1.0000 g | Freq: Once | INTRAVENOUS | Status: AC
  Administered 2023-02-07: 1 g via INTRAVENOUS
  Filled 2023-02-07: qty 10

## 2023-02-07 MED ORDER — SODIUM CHLORIDE 0.9 % IV SOLN
1.0000 g | INTRAVENOUS | Status: AC
  Administered 2023-02-08 – 2023-02-09 (×2): 1 g via INTRAVENOUS
  Filled 2023-02-07 (×2): qty 10

## 2023-02-07 MED ORDER — TAMSULOSIN HCL 0.4 MG PO CAPS
0.4000 mg | ORAL_CAPSULE | Freq: Every day | ORAL | Status: DC
  Administered 2023-02-08 – 2023-02-21 (×14): 0.4 mg via ORAL
  Filled 2023-02-07 (×15): qty 1

## 2023-02-07 MED ORDER — DEXTROSE-SODIUM CHLORIDE 5-0.45 % IV SOLN: INTRAVENOUS | Status: AC

## 2023-02-07 MED ORDER — POTASSIUM CHLORIDE 10 MEQ/100ML IV SOLN
10.0000 meq | INTRAVENOUS | Status: AC
  Administered 2023-02-07 (×6): 10 meq via INTRAVENOUS
  Filled 2023-02-07 (×6): qty 100

## 2023-02-07 NOTE — Procedures (Addendum)
Patient Name: Selena Branch  MRN: [id number]  Epilepsy Attending: Charlsie Quest  Referring Physician/Provider: Marjorie Smolder, NP  Duration: 02/06/2023 1629 02/07/2023 1629   Patient history: 82 y.o. female with a past medical history of CLL, ITP, left parietal ICH in 2021 presenting with aphasia and generalized weakness. EEG to evaluate for seizure.   Level of alertness: lethargic, asleep   AEDs during EEG study: LEV, LCM   Technical aspects: This EEG study was done with scalp electrodes positioned according to the 10-20 International system of electrode placement. Electrical activity was reviewed with band pass filter of 1-70Hz , sensitivity of 7 uV/mm, display speed of 53mm/sec with a 60Hz  notched filter applied as appropriate. EEG data were recorded continuously and digitally stored.  Video monitoring was available and reviewed as appropriate.   Description:  No clear posterior dominant rhythm was seen. Sleep was characterized by sleep spindles (12 to 14 Hz), maximal frontocentral region. EEG showed continuous generalized and lateralized left hemisphere 3 to 6 Hz theta-delta slowing admixed with 15-18Hz  beta activity. Lateralized periodic discharges ( LPDs) at 0.5-1hz  were also noted arising from left hemisphere. The LPDs at times had faster 13-15hz  frequencies admixed with them and had waxing and waning morphology followed by generalized attenuation for 1-3 seconds. Hyperventilation and photic stimulation were not performed.    Of note, study was technically difficult due to significant electrode artifact    ABNORMALITY - Lateralized periodic discharges, left hemisphere - Continuous slow, generalized and lateralized left hemisphere   IMPRESSION: This technically difficult study showed evidence of epileptogenicity arising from left hemisphere which is on the ictal-interictal continuum. The intermittent beta frequencies, waxing and waning nature followed by attenuation makes it  concerning for ictal nature. Clinical correlation is recommended.  Additionally there is cortical dysfunction arising from left hemisphere likely secondary to underlying structural abnormality. Lastly there is moderate diffuse encephalopathy. No definite seizures were seen.   [name] [name]

## 2023-02-07 NOTE — Progress Notes (Signed)
Neurology Progress Note   Brief HPI: 82 year old patient with history of CLL, ITP and left parietal ICH in 2021 who presented with aphasia and generalized weakness. 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  Head CT and brain MRI were negative for acute abnormalities.  She has no previous history of seizures, no family history of seizures, no history of brain surgery, no history of meningitis or encephalitis. She does have a history of 1 fall in which she hit her head and had to have stitches, but no other serious head injuries.  At baseline, she is alert and oriented, able to do all of her ADLs and cares for her husband.   S:// Patient asleep in bed with bilateral wrist restraints and mittens on. She is on LTM. LTM with lateralized periodic discharges. Patient awakens easily to voice. She is able to state her name correctly, unable to answer other orientation questions correctly. She is very slow to respond, can follow simple one step commands. She will perseverate words and movements. No family at the bedside    O:// Current vital signs: BP (!) 136/59 (BP Location: Left Arm)   Pulse 70   Temp 98.2 F (36.8 C) (Axillary)   Resp 14   Wt 49.2 kg   SpO2 99%   BMI 22.67 kg/m  Vital signs in last 24 hours: Temp:  [98 F (36.7 C)-98.2 F (36.8 C)] 98.2 F (36.8 C) (05/26 1522) Pulse Rate:  [62-70] 70 (05/26 2000) Resp:  [13-19] 14 (05/27 0334) BP: (133-152)/(59-69) 136/59 (05/27 0334) SpO2:  [99 %] 99 % (05/26 2000)  GENERAL: elderly female in NAD.  HEENT: - Normocephalic and atraumatic, dry mm LUNGS - Clear to auscultation bilaterally with no wheezes CV - S1S2 RRR, no m/r/g, equal pulses bilaterally. ABDOMEN - Soft, nontender, nondistended with normoactive BS Ext: warm, well perfused, intact peripheral pulses, no edema  NEURO:  Mental Status: sleeping comfortably in bed. She awakens to voice easily.  She is oriented to self. Unable to answer other orientation  questions correctly. She is perseverating her words and actions. She is able to follow very simple one step commands  Speech is clear. Not able to name objects or repeat. Poor attention Motor: moving all 4 extremities spontaneously  Tone: is normal and bulk is normal Sensation- withdraws in all 4 extremities  Coordination: unable to assess due to AMS Gait- deferred   Medications  Current Facility-Administered Medications:    acetaminophen (TYLENOL) tablet 650 mg, 650 mg, Oral, Q4H PRN **OR** acetaminophen (TYLENOL) suppository 650 mg, 650 mg, Rectal, Q4H PRN, Rodolph Bong, MD   amLODipine (NORVASC) tablet 10 mg, 10 mg, Oral, QHS, Singh, Stanford Scotland, MD   atorvastatin (LIPITOR) tablet 40 mg, 40 mg, Oral, Daily, Thedore Mins, Stanford Scotland, MD   carvedilol (COREG) tablet 3.125 mg, 3.125 mg, Oral, BID WC, Leroy Sea, MD   [START ON 02/08/2023] cefTRIAXone (ROCEPHIN) 1 g in sodium chloride 0.9 % 100 mL IVPB, 1 g, Intravenous, Q24H, Singh, Prashant K, MD   chlorhexidine gluconate (MEDLINE KIT) (PERIDEX) 0.12 % solution 15 mL, 15 mL, Mouth Rinse, BID, Rodolph Bong, MD, 15 mL at 02/05/23 0800   Chlorhexidine Gluconate Cloth 2 % PADS 6 each, 6 each, Topical, Daily, Leroy Sea, MD, 6 each at 02/06/23 1657   dextrose 5 % and 0.45 % NaCl infusion, , Intravenous, Continuous, Leroy Sea, MD, Last Rate: 50 mL/hr at 02/07/23 0613, New Bag at 02/07/23 0613   dorzolamide-timolol (COSOPT) 2-0.5 %  ophthalmic solution 1 drop, 1 drop, Both Eyes, Daily, Rodolph Bong, MD, 1 drop at 02/06/23 0953   heparin injection 5,000 Units, 5,000 Units, Subcutaneous, Q8H, Leroy Sea, MD, 5,000 Units at 02/07/23 0616   hydrALAZINE (APRESOLINE) injection 5 mg, 5 mg, Intravenous, Q6H PRN, Rodolph Bong, MD   lacosamide (VIMPAT) 200 mg in sodium chloride 0.9 % 25 mL IVPB, 200 mg, Intravenous, Q12H, Milon Dikes, MD, Stopped at 02/06/23 2242   latanoprost (XALATAN) 0.005 % ophthalmic solution  1 drop, 1 drop, Both Eyes, QHS, Rodolph Bong, MD, 1 drop at 02/06/23 2219   levETIRAcetam (KEPPRA) IVPB 1500 mg/ 100 mL premix, 1,500 mg, Intravenous, Q12H, Milon Dikes, MD, Stopped at 02/06/23 2053   LORazepam (ATIVAN) injection 4 mg, 4 mg, Intravenous, Q5 Min x 2 PRN, Rodolph Bong, MD, 4 mg at 02/04/23 2018   ondansetron (ZOFRAN) tablet 4 mg, 4 mg, Oral, Q6H PRN **OR** ondansetron (ZOFRAN) injection 4 mg, 4 mg, Intravenous, Q6H PRN, Rodolph Bong, MD   Oral care mouth rinse, 15 mL, Mouth Rinse, 10 times per day, Rodolph Bong, MD, 15 mL at 02/06/23 2218   pantoprazole (PROTONIX) injection 40 mg, 40 mg, Intravenous, QHS, Rodolph Bong, MD, 40 mg at 02/06/23 2215   polyethylene glycol (MIRALAX / GLYCOLAX) packet 17 g, 17 g, Oral, Daily PRN, Rodolph Bong, MD   sertraline (ZOLOFT) tablet 25 mg, 25 mg, Oral, Daily, Susa Raring K, MD   sodium chloride flush (NS) 0.9 % injection 3 mL, 3 mL, Intravenous, Q12H, Rodolph Bong, MD, 3 mL at 02/06/23 2218   sorbitol 70 % solution 30 mL, 30 mL, Oral, Daily PRN, Rodolph Bong, MD   tamsulosin [facility]) capsule 0.4 mg, 0.4 mg, Oral, Daily, Leroy Sea, MD  Labs CBC    Component Value Date/Time   WBC 8.1 02/07/2023 0342   RBC 4.17 02/07/2023 0342   HGB 13.2 02/07/2023 0342   HGB 14.3 02/01/2023 0923   HCT 38.4 02/07/2023 0342   PLT 94 (L) 02/07/2023 0342   PLT 109 (L) 02/01/2023 0923   MCV 92.1 02/07/2023 0342   MCH 31.7 02/07/2023 0342   MCHC 34.4 02/07/2023 0342   RDW 12.5 02/07/2023 0342   LYMPHSABS 2.1 02/07/2023 0342   MONOABS 0.7 02/07/2023 0342   EOSABS 0.2 02/07/2023 0342   BASOSABS 0.0 02/07/2023 0342    CMP     Component Value Date/Time   NA 139 02/07/2023 0342   K 2.9 (L) 02/07/2023 0342   CL 108 02/07/2023 0342   CO2 23 02/07/2023 0342   GLUCOSE 119 (H) 02/07/2023 0342   BUN 5 (L) 02/07/2023 0342   CREATININE 0.60 02/07/2023 0342   CREATININE 0.81 02/01/2023 0923   CALCIUM  7.7 (L) 02/07/2023 0342   PROT 6.3 (L) 02/05/2023 0310   ALBUMIN 4.0 02/05/2023 0310   AST 30 02/05/2023 0310   AST 21 02/01/2023 0923   ALT 22 02/05/2023 0310   ALT 9 02/01/2023 0923   ALKPHOS 42 02/05/2023 0310   BILITOT 0.9 02/05/2023 0310   BILITOT 0.5 02/01/2023 0923   GFRNONAA >60 02/07/2023 0342   GFRNONAA >60 02/01/2023 0923   GFRAA >60 06/06/2020 1000     Lipid Panel     Component Value Date/Time   CHOL 139 02/05/2023 0621   TRIG 31 02/05/2023 0621   HDL 64 02/05/2023 0621   CHOLHDL 2.2 02/05/2023 0621   VLDL 6 02/05/2023 0621   LDLCALC 69 02/05/2023  1610     Imaging I have reviewed images in epic and the results pertinent to this consultation are:  CT-scan of the brain- No acute abnormalities   CTA head and neck: No LVO or hemodynamically significant stenosis   MRI examination of the brain-No acute infarct, no acute abnormalities   EEG 5/27: Lateralized periodic discharges, left hemisphere, Continuous slow, generalized and lateralized left hemisphere   EEG 5/26: 9 seizures without clinical signs arising from left hemisphere overnight, lasting about 10 seconds each, LPD's in left hemisphere   EEG 5/25: Focal electrographic status epilepticus in left hemisphere, maximal left parietal region which terminated when patient was loaded with Keppra, LPD's left hemisphere   Assessment: 82 year old patient with history of CLL, ITP and ICH presented with aphasia and generalized weakness.  Head CT and MRI were negative for acute abnormalities, but EEG revealed the patient to be in electrographic status epilepticus, which terminated after Keppra load.   - More specifically, EEG showed ongoing seizure activity and LPD's arising from left hemisphere, parietal lobe; this is the area where her previous ICH was.   - Patient is now on Keppra 1500 mg twice daily, and Vimpat 200 mg twice daily  - LTM EEG report for today:  Lateralized periodic discharges (LPDs), left hemisphere;  continuous slow, generalized and lateralized to the left hemisphere. This technically difficult study showed evidence of epileptogenicity arising from left hemisphere which is on the ictal-interictal continuum. The intermittent beta frequencies, waxing and waning nature followed by attenuation makes it concerning for ictal nature. Additionally there is cortical dysfunction arising from left hemisphere likely secondary to underlying structural abnormality. Lastly there is moderate diffuse encephalopathy. No definite seizures were seen.  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  Use caution when using heavy equipment or power tools. Avoid working on ladders or at heights. Take showers instead of baths. Ensure the water temperature is not too high on the home water heater. Do not go swimming alone. Do not lock yourself in a room alone (i.e. bathroom). When caring for infants or small children, sit down when holding, feeding, or changing them to minimize risk of injury to the child in the event you have a seizure. Maintain good sleep hygiene. Avoid alcohol.   Gevena Mart DNP, ACNPC-AG  [facility]  Electronically signed: Dr. Caryl Pina

## 2023-02-07 NOTE — Progress Notes (Addendum)
Speech Language Pathology Treatment: Dysphagia;Cognitive-Linquistic  Patient Details Name: Selena Branch MRN: [id number] DOB: 03/05/1941 Today's Date: 02/07/2023 Time: 0920-0935 SLP Time Calculation (min) (ACUTE ONLY): 15 min  Assessment / Plan / Recommendation Clinical Impression  Pt demonstrates improving cognition; now able to respond to people in the room. Her restraints were removed briefly and when given a cup with a straw pt recognized it and took some sips without signs of dysphagia. SLP fed her a bit of puree and grimaced and seemed surprised by it. Pt very perseverative today; thinks everything she hears is someone asking her name. She shouts it and says "are they deaf or something? I have a very soft voice I can't say it any louder." SLP will initiate a full liquid diet though intake may still be limited given cognition. Upgrade to regular ok as soon as pt can go with restraint. IF staff is going to offer her full liquids recommend removing restraint and letting her hold a lidded cup with a straw.    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  Head CT and brain MRI were negative for acute abnormalities.  She has no previous history of seizures, no family history of seizures, no history of brain surgery, no history of meningitis or encephalitis, and history of 1 fall in which she hit her head and had to have stitches but no other serious head injuries.  At baseline, she is alert and oriented, able to do all of her ADLs and cares for her husband.      SLP Plan  Continue with current plan of care      Recommendations for follow up therapy are one component of a multi-disciplinary discharge planning process, led by the attending physician.  Recommendations may be updated based on patient status, additional functional criteria and insurance  authorization.    Recommendations  Diet recommendations: Thin liquid Liquids provided via: Cup;Straw Medication Administration: Via alternative means Supervision: Staff to assist with self feeding                              Continue with current plan of care     Eleny Cortez, Riley Nearing  02/07/2023, 9:37 AM

## 2023-02-07 NOTE — Progress Notes (Signed)
PT Cancellation Note  Patient Details Name: Selena Branch MRN: [id number] DOB: [birth year]   Cancelled Treatment:    Reason Eval/Treat Not Completed: Patient not medically ready. Per discussion with RN and OT and chart review, pt continues to be limited by cognition and difficulty following commands. PT will hold evaluation and assess tomorrow for hopefully improved participation and evaluation. Thank you.    Leonie Man 02/07/2023, 2:36 PM

## 2023-02-07 NOTE — TOC CM/SW Note (Signed)
Transition of Care [facility]) - Inpatient Brief Assessment   Patient Details  Name: Selena Branch MRN: [id number] Date of Birth: 05/20/1941  Transition of Care [facility] CM/SW Contact:    Mearl Latin, LCSW Phone Number: 02/07/2023, 1:41 PM   Clinical Narrative: Patient from home with husband. CSW following for potential SNF placement needs.    Transition of Care Asessment: Insurance and Status: Insurance coverage has been reviewed Patient has primary care physician: Yes Home environment has been reviewed: From home w/spouse Prior level of function:: Independent Prior/Current Home Services: No current home services Social Determinants of Health Reivew: SDOH reviewed no interventions necessary Readmission risk has been reviewed: Yes Transition of care needs: transition of care needs identified, TOC will continue to follow

## 2023-02-07 NOTE — Progress Notes (Signed)
   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  Head CT and brain MRI were negative for acute abnormalities.  She has no previous history of seizures, no family history of seizures, no history of brain surgery, no history of meningitis or encephalitis, and history of 1 fall in which she hit her head and had to have stitches but no other serious head injuries.  At baseline, she is alert and oriented, able to do all of her ADLs and cares for her husband.  Dysphagia Treatment  Pt repositioned upright, could not tolerate removal of restraints. SLP asked pt if she wanted water and pt replied yes, but when water touched to her lips pt did not recognize it and was orally defensive. Pt to remain NPO  Temperature Spikes Noted No  Oral Cavity - Dentition Adequate natural dentition  SLP - End of Session  Patient left in bed  Assessment / Recommendations / Plan  Plan Continue with current plan of care  Dysphagia Recommendations  Diet recommendations NPO  Liquids provided via Cup;Straw  General Recommendations  Follow Up Recommendations Follow physician's recommendations for discharge plan and follow up therapies  Progression Toward Goals  Progression toward goals Not progressing toward goals (comment)  SLP Time Calculation  SLP Start Time (ACUTE ONLY) 1100  SLP Stop Time (ACUTE ONLY) 1115  SLP Time Calculation (min) (ACUTE ONLY) 15 min  SLP Evaluations  $ SLP Speech Visit 1 Visit  SLP Evaluations  $Swallowing Treatment 1 Procedure

## 2023-02-07 NOTE — Evaluation (Signed)
Occupational Therapy Evaluation Patient Details Name: Selena Branch MRN: [id number] DOB: 08-Dec-1940 Today's Date: 02/07/2023   History of Present Illness Selena Branch is a 82 y.o. female with medical history significant of CLL currently in remission, glaucoma, hypertension, thrombocytopenia, hyperlipidemia presenting to the ED with sudden onset altered mental status/aphasia. WUJ:[id number]; EEG: positive for seizures.   Clinical Impression   This 82 yo female admitted with above presents to acute OT with PLOF per chart of being independent, living at home, and taking care of her husband. Currently she is total A for all basic ADLs and +1/+2 total A for all basic ADLs. She will continue to benefit from acute OT with follow up continued inpatient follow up therapy, <3 hours/day.        Recommendations for follow up therapy are one component of a multi-disciplinary discharge planning process, led by the attending physician.  Recommendations may be updated based on patient status, additional functional criteria and insurance authorization.   Assistance Recommended at Discharge Frequent or constant Supervision/Assistance  Patient can return home with the following Two people to help with walking and/or transfers;A lot of help with bathing/dressing/bathroom;Direct supervision/assist for financial management;Direct supervision/assist for medications management;Assist for transportation;Assistance with cooking/housework;Help with stairs or ramp for entrance;Assistance with feeding    Functional Status Assessment  Patient has had a recent decline in their functional status and demonstrates the ability to make significant improvements in function in a reasonable and predictable amount of time.  Equipment Recommendations  Other (comment) (TBD next venue)       Precautions / Restrictions Precautions Precaution Comments: seizures Restrictions Weight Bearing Restrictions: No      Mobility Bed  Mobility Overal bed mobility: Needs Assistance Bed Mobility: Rolling, Sidelying to Sit, Sit to Supine Rolling: Max assist Sidelying to sit: Total assist   Sit to supine: Total assist            Balance Overall balance assessment: Needs assistance Sitting-balance support: No upper extremity supported, Feet supported Sitting balance-Leahy Scale: Zero Sitting balance - Comments: Did not attempt to use either UE to support self at EOB                                   ADL either performed or assessed with clinical judgement   ADL                                         General ADL Comments: total A     Vision   Additional Comments: Had eyes open most of the session but not following commands in relation to eye opening/closing            Pertinent Vitals/Pain Pain Assessment Pain Assessment: Faces Faces Pain Scale: No hurt     Hand Dominance Right (from prior chart)   Extremity/Trunk Assessment Upper Extremity Assessment Upper Extremity Assessment:  (noted some resistance intermittently with PROM, minimal spontaneous movement while I was with her--but had doing more evidently since she has Bil wrist and mitt restraints)           Communication Communication Communication: No difficulties (from prior chart, and pt current chart pt from home and taking care of her husband)   Cognition Arousal/Alertness: Awake/alert Behavior During Therapy: Flat affect Overall Cognitive Status: Impaired/Different from baseline  General Comments: Per chart she is from home and was taking care of her husband. All answers to questions to yes/no questions were "yes" . She did answer one open ended question --how old are you "82"--she is 33. Then after this she answered every question with 81. Did not follow any commands verbally. When trying to get her to roll left and right in bed, she spontaneously did this  when I initiated the roll by placing my hand under her shoulder and giving a slight start to the roll (she bent up her knee and rotated her trunk over)                Home Living Family/patient expects to be discharged to:: Skilled nursing facility                                        Prior Functioning/Environment               Mobility Comments: Per chart pt was from home and was taking care of her husband ADLs Comments: Per chart pt was from home and was taking care of her husband        OT Problem List: Impaired balance (sitting and/or standing);Decreased cognition;Decreased safety awareness;Impaired UE functional use;Impaired tone      OT Treatment/Interventions: Self-care/ADL training;DME and/or AE instruction;Patient/family education;Balance training;Cognitive remediation/compensation    OT Goals(Current goals can be found in the care plan section) Acute Rehab OT Goals Patient Stated Goal: unable OT Goal Formulation: Patient unable to participate in goal setting Time For Goal Achievement: 02/21/23 Potential to Achieve Goals: Fair  OT Frequency: Min 1X/week       AM-PAC OT "6 Clicks" Daily Activity     Outcome Measure Help from another person eating meals?: Total Help from another person taking care of personal grooming?: Total Help from another person toileting, which includes using toliet, bedpan, or urinal?: Total Help from another person bathing (including washing, rinsing, drying)?: Total Help from another person to put on and taking off regular upper body clothing?: Total Help from another person to put on and taking off regular lower body clothing?: Total 6 Click Score: 6   End of Session Nurse Communication: Mobility status  Activity Tolerance: Other (comment) (limited by cognition) Patient left: in bed;with call bell/phone within reach;with bed alarm set  OT Visit Diagnosis: Other abnormalities of gait and mobility (R26.89);Other  symptoms and signs involving cognitive function;Cognitive communication deficit (R41.841) Symptoms and signs involving cognitive functions:  (seizures)                Time: 8119-1478 OT Time Calculation (min): 23 min Charges:  OT General Charges $OT Visit: 1 Visit OT Evaluation $OT Eval Moderate Complexity: 1 Mod OT Treatments $Therapeutic Activity: 8-22 mins  [name] OT Acute Rehabilitation Services Office [phone]    Evette Georges 02/07/2023, 1:14 PM

## 2023-02-07 NOTE — Procedures (Signed)
Patient Name: Selena Branch  MRN: [id number]  Epilepsy Attending: Charlsie Quest  Referring Physician/Provider: Marjorie Smolder, NP  Duration: 02/07/2023 1629 02/08/2023 1629   Patient history: 82 y.o. female with a past medical history of CLL, ITP, left parietal ICH in 2021 presenting with aphasia and generalized weakness. EEG to evaluate for seizure.   Level of alertness: awake. asleep   AEDs during EEG study: LEV, LCM   Technical aspects: This EEG study was done with scalp electrodes positioned according to the 10-20 International system of electrode placement. Electrical activity was reviewed with band pass filter of 1-70Hz , sensitivity of 7 uV/mm, display speed of 72mm/sec with a 60Hz  notched filter applied as appropriate. EEG data were recorded continuously and digitally stored.  Video monitoring was available and reviewed as appropriate.   Description:   The posterior dominant rhythm consists of 8-9 Hz activity of moderate voltage (25-35 uV) seen predominantly in posterior head regions, symmetric and reactive to eye opening and eye closing.  Sleep was characterized by sleep spindles (12 to 14 Hz), maximal frontocentral region. EEG showed continuous generalized and lateralized left hemisphere 3 to 6 Hz theta-delta slowing admixed with 15-18Hz  beta activity. Lateralized periodic discharges ( LPDs) at 0.5-1hz  were also noted arising from left hemisphere.  Hyperventilation and photic stimulation were not performed.    ABNORMALITY - Lateralized periodic discharges, left hemisphere - Continuous slow, generalized and lateralized left hemisphere   IMPRESSION: This study showed evidence of epileptogenicity and  cortical dysfunction arising from left hemisphere likely secondary to underlying structural abnormality. Additionally there is moderate diffuse encephalopathy. No definite seizures were seen.  EEG appears to be improving compared to previous day.   [name]

## 2023-02-07 NOTE — Progress Notes (Signed)
PROGRESS NOTE                                                                                                                                                                                                             Patient Demographics:    Selena Branch, is a 82 y.o. female, DOB - [birth year], KGM:[id number]  Outpatient Primary MD for the patient is Gweneth Dimitri, MD    LOS - 2  Admit date - 02/04/2023    Chief Complaint  Patient presents with   Code Stroke       Brief Narrative (HPI from H&P)   82 y.o. female with medical history significant of CLL currently in remission, glaucoma, hypertension, thrombocytopenia, hyperlipidemia presenting to the ED with sudden onset altered mental status/aphasia, this happened while she was on the phone at home, she was doing fine but suddenly became unresponsive and confused.  She was brought to the ER where she was seen by neurology and admitted for evaluation of expressive aphasia and possible stroke workup.   Subjective:   Patient in bed denies any headache chest or abdominal pain, less confused this morning.   Assessment  & Plan :   #1  Metabolic encephalopathy, expressive aphasia.  Due to status epilepticus, MRI brain, CTA head and neck unremarkable, on Keppra & Vimpat, case discussed with neurology 02/07/23, placed on IV fluids, PT, OT speech eval.  Further medication adjustment per neurology, may need LP if seizures remain out of control with her underlying history of CLL.      2.  Glaucoma   - resume home eyedrops.   3.  Hypertension  - Patient aphasic, not opening her mouth or answering any questions. Placed on as needed IV hydralazine.   4.  Hyperlipidemia  - Hold statin.  When more alert and tolerating oral intake we will resume statin.   5.  CLL/thrombocytopenia  - Stable.  Outpatient follow-up with oncology.   6.  History of intracerebral hemorrhage  - Stable.  7.   Urinary retention with possible UTI.  Foley placed, Flomax, 3 days of Rocephin.  8.  Hypokalemia.  Replaced.      Condition - Extremely Guarded  Family Communication  : [name] [phone]  called and informed in detail on 02/05/2023, daughter, husband updated bedside 02/07/2023  Code Status :  DNR  Consults  :  Neuro  PUD Prophylaxis : PPI   Procedures  :     MRI brain, CTA head and neck.  No stroke, no significant occlusion in any vessel.  EEG.  Positive for possible status epilepticus.      Disposition Plan  :    Status is: Inpatient  DVT Prophylaxis  :    heparin injection 5,000 Units Start: 02/05/23 1400 SCDs Start: 02/04/23 1729    Lab Results  Component Value Date   PLT 94 (L) 02/07/2023    Diet :  Diet Order             Diet NPO time specified Except for: Sips with Meds  Diet effective now                    Inpatient Medications  Scheduled Meds:  amLODipine  10 mg Oral QHS   atorvastatin  40 mg Oral Daily   carvedilol  3.125 mg Oral BID WC   chlorhexidine gluconate (MEDLINE KIT)  15 mL Mouth Rinse BID   Chlorhexidine Gluconate Cloth  6 each Topical Daily   dorzolamide-timolol  1 drop Both Eyes Daily   heparin injection (subcutaneous)  5,000 Units Subcutaneous Q8H   latanoprost  1 drop Both Eyes QHS   mouth rinse  15 mL Mouth Rinse 10 times per day   pantoprazole (PROTONIX) IV  40 mg Intravenous QHS   sertraline  25 mg Oral Daily   sodium chloride flush  3 mL Intravenous Q12H   tamsulosin  0.4 mg Oral Daily   Continuous Infusions:  [START ON 02/08/2023] cefTRIAXone (ROCEPHIN)  IV     dextrose 5 % and 0.45 % NaCl 50 mL/hr at 02/07/23 0613   lacosamide (VIMPAT) IV Stopped (02/06/23 2242)   levETIRAcetam Stopped (02/06/23 2053)   PRN Meds:.acetaminophen **OR** acetaminophen, hydrALAZINE, LORazepam, ondansetron **OR** ondansetron (ZOFRAN) IV, polyethylene glycol, sorbitol  Antibiotics  :    Anti-infectives (From admission,  onward)    Start     Dose/Rate Route Frequency Ordered Stop   02/08/23 0600  cefTRIAXone (ROCEPHIN) 1 g in sodium chloride 0.9 % 100 mL IVPB        1 g 200 mL/hr over 30 Minutes Intravenous Every 24 hours 02/07/23 0600 02/10/23 0559   02/07/23 0700  cefTRIAXone (ROCEPHIN) 1 g in sodium chloride 0.9 % 100 mL IVPB        1 g 200 mL/hr over 30 Minutes Intravenous  Once 02/07/23 0601 02/07/23 0645         Objective:   Vitals:   02/06/23 1522 02/06/23 2000 02/07/23 0000 02/07/23 0334  BP: (!) 145/60 (!) 152/63 133/65 (!) 136/59  Pulse: 62 70    Resp: 13  17 14   Temp: 98.2 F (36.8 C)     TempSrc: Axillary     SpO2: 99% 99%    Weight:        Wt Readings from Last 3 Encounters:  02/04/23 49.2 kg  10/29/22 48.3 kg  07/30/22 48.8 kg     Intake/Output Summary (Last 24 hours) at 02/07/2023 0857 Last data filed at 02/07/2023 0646 Gross per 24 hour  Intake 2668.19 ml  Output 2550 ml  Net 118.19 ml     Physical Exam  Awake and less confused, denies any headache, moving all 4 extremities  [place].AT,PERRAL Supple Neck, No JVD,   Symmetrical Chest wall movement, Good air movement bilaterally, CTAB RRR,No Gallops,Rubs or new Murmurs,  +ve B.Sounds, Abd  Soft, No tenderness,   No Cyanosis, Clubbing or edema       Data Review:    Recent Labs  Lab 02/01/23 0923 02/04/23 1501 02/04/23 1508 02/05/23 0310 02/06/23 0400 02/07/23 0342  WBC 6.0 8.2  --  9.1 8.8 8.1  HGB 14.3 14.6 14.6 14.2 12.7 13.2  HCT 41.0 43.6 43.0 41.2 37.7 38.4  PLT 109* 116*  --  90* 101* 94*  MCV 93.0 93.0  --  92.4 92.0 92.1  MCH 32.4 31.1  --  31.8 31.0 31.7  MCHC 34.9 33.5  --  34.5 33.7 34.4  RDW 12.7 12.9  --  12.8 12.9 12.5  LYMPHSABS 2.8 4.3*  --  1.5 3.1 2.1  MONOABS 0.6 0.6  --  0.5 0.7 0.7  EOSABS 0.2 0.2  --  0.0 0.1 0.2  BASOSABS 0.0 0.0  --  0.0 0.0 0.0    Recent Labs  Lab 02/01/23 0923 02/04/23 1501 02/04/23 1508 02/05/23 0310 02/05/23 0621 02/06/23 0400 02/07/23 0342  NA  144 139 140 139  --  137 139  K 3.9 3.5 3.7 3.6  --  3.3* 2.9*  CL 107 104 104 106  --  106 108  CO2 29 26  --  22  --  24 23  ANIONGAP 8 9  --  11  --  7 8  GLUCOSE 108* 155* 155* 124*  --  111* 119*  BUN 18 15 17 14   --  9 5*  CREATININE 0.81 0.99 1.00 0.76  --  0.66 0.60  AST 21 28  --  30  --   --   --   ALT 9 14  --  22  --   --   --   ALKPHOS 53 53  --  42  --   --   --   BILITOT 0.5 0.5  --  0.9  --   --   --   ALBUMIN 4.3 4.2  --  4.0  --   --   --   INR  --  1.0  --   --   --   --   --   TSH  --   --   --   --  0.618  --   --   BNP  --   --   --   --   --  286.2* 154.1*  MG  --   --   --  1.9  --  2.1 1.9  CALCIUM 9.3 9.5  --  8.6*  --  7.8* 7.7*     Lab Results  Component Value Date   CHOL 139 02/05/2023   HDL 64 02/05/2023   LDLCALC 69 02/05/2023   TRIG 31 02/05/2023   CHOLHDL 2.2 02/05/2023    No results found for: "HGBA1C"    Radiology Reports Overnight EEG with video  Result Date: 02/05/2023 Charlsie Quest, MD     02/07/2023  4:52 AM Patient Name: KYNZLEE KASSNER MRN: [id number] Epilepsy Attending: Charlsie Quest Referring Physician/Provider: Marjorie Smolder, NP Duration: 02/04/2023 1629 02/05/2023 1629 Patient history: 82 y.o. female with a past medical history of CLL, ITP, left parietal ICH in 2021 presenting with aphasia and generalized weakness. EEG to evaluate for seizure. Level of alertness: lethargic AEDs during EEG study: LEV Technical aspects: This EEG study was done with scalp electrodes positioned according to the 10-20 International system of electrode placement. Electrical activity was reviewed with band  pass filter of 1-70Hz , sensitivity of 7 uV/mm, display speed of 33mm/sec with a 60Hz  notched filter applied as appropriate. EEG data were recorded continuously and digitally stored.  Video monitoring was available and reviewed as appropriate. Description: At the beginning of study, EEG showed lateralized periodic discharges in left hemisphere,  maximal left parietal region at 2.5-3Hz  admixed with 3-5hz  theta-delta slowing and overriding 13-15hz  beta activity. Clinically patient was aphasic per neurologist. 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 Patient was loaded with Keppra  on 02/04/2023 at 1716 and subsequently status epilepticus resolved. After around 1830, EEG showed continuous generalized and lateralized left hemisphere 3 to 6 Hz theta-delta slowing. Lateralized periodic discharges at 0.5-1hz  were also noted arising from left hemisphere. Hyperventilation and photic stimulation were not performed.   EEG was difficult to interpret between 02/04/2023 1946 to 02/05/2023 1010 due to significant electrode artifact. 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 Clinically, per neurologist patient was aphasic. Patient was loaded with keppra  on 02/04/2023 at 1716 and subsequently status epilepticus resolved. EEG was then suggestive of epileptogenicity and cortical dysfunction arising from left hemisphere likely secondary to underlying structural abnormality and increased risk of seizure recurrence. Lastly there is moderate diffuse encephalopathy. Charlsie Quest   DG Chest Port 1 View  Result Date: 02/04/2023 CLINICAL DATA:  Altered mental status EXAM: PORTABLE CHEST 1 VIEW COMPARISON:  CT 12/01/2020 FINDINGS: Low lung volumes. Patchy retrocardiac opacity. No pleural effusion. Normal cardiac size. Aortic atherosclerosis IMPRESSION: Low lung volumes with patchy retrocardiac opacity, atelectasis versus pneumonia. Electronically Signed   By: Jasmine Pang M.D.   On: 02/04/2023 17:22   MR BRAIN WO CONTRAST  Result  Date: 02/04/2023 CLINICAL DATA:  Neuro deficit, acute, stroke suspected. EXAM: MRI HEAD WITHOUT CONTRAST TECHNIQUE: Multiplanar, multiecho pulse sequences of the brain and surrounding structures were obtained without intravenous contrast. COMPARISON:  Head CT 02/04/2023 and older.  MRI brain 01/14/2016. FINDINGS: Brain: No acute infarct or hemorrhage. Sequela of prior hemorrhage in the left superior temporal gyrus. Old microhemorrhage in the posterior right temporal lobe. No hydrocephalus or extra-axial collection. No mass or midline shift. Vascular: Normal flow voids. Skull and upper cervical spine: Normal marrow signal. Sinuses/Orbits: Unremarkable. Other: None. IMPRESSION: No acute intracranial process. Electronically Signed   By: Orvan Falconer M.D.   On: 02/04/2023 16:10   CT ANGIO HEAD NECK W WO CM (CODE STROKE)  Result Date: 02/04/2023 CLINICAL DATA:  Neuro deficit, acute, stroke suspected right sided weakness, aphasia. EXAM: CT ANGIOGRAPHY HEAD AND NECK WITH AND WITHOUT CONTRAST TECHNIQUE: Multidetector CT imaging of the head and neck was performed using the standard protocol during bolus administration of intravenous contrast. Multiplanar CT image reconstructions and MIPs were obtained to evaluate the vascular anatomy. Carotid stenosis measurements (when applicable) are obtained utilizing NASCET criteria, using the distal internal carotid diameter as the denominator. RADIATION DOSE REDUCTION: This exam was performed according to the departmental dose-optimization program which includes automated exposure control, adjustment of the mA and/or kV according to patient size and/or use of iterative reconstruction technique. CONTRAST:  50mL OMNIPAQUE IOHEXOL 350 MG/ML SOLN COMPARISON:  Head CT 02/04/2023.  MRI brain 01/14/2016. FINDINGS: CTA NECK FINDINGS Aortic arch: Three-vessel arch configuration. Atherosclerotic calcifications of the aortic arch and arch vessel origins. Arch vessel  origins are patent.  Right carotid system: No evidence of dissection, stenosis (50% or greater), or occlusion. Left carotid system: No evidence of dissection, stenosis (50% or greater), or occlusion. Vertebral arteries: Codominant. No evidence of dissection, stenosis (50% or greater), or occlusion. 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 Other neck: Unremarkable. Upper chest: Unremarkable. Review of the MIP images confirms the above findings CTA HEAD FINDINGS Anterior circulation: Intracranial ICAs are patent without stenosis or aneurysm. The proximal ACAs and MCAs are patent without stenosis or aneurysm. Distal branches are symmetric. Posterior circulation: Normal basilar artery. The SCAs, AICAs and PICAs are patent proximally. The PCAs are patent proximally without stenosis or aneurysm. Distal branches are symmetric. Venous sinuses: Patent. 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 Review of the MIP images confirms the above findings IMPRESSION: No large vessel occlusion, hemodynamically significant stenosis or aneurysm of the head or neck vessels. Aortic Atherosclerosis (ICD10-I70.0). Electronically Signed   By: Orvan Falconer M.D.   On: 02/04/2023 15:42   CT HEAD CODE STROKE WO CONTRAST  Result Date: 02/04/2023 CLINICAL DATA:  Code stroke. Neuro deficit, acute, stroke suspected no lateralizing information provided. EXAM: CT HEAD WITHOUT CONTRAST TECHNIQUE: Contiguous axial images were obtained from the base of the skull through the vertex without intravenous contrast. RADIATION DOSE REDUCTION: This exam was performed according to the departmental dose-optimization program which includes automated exposure control, adjustment of the mA and/or kV according to patient size and/or use of iterative reconstruction technique. COMPARISON:  CT Head 11/14/19 FINDINGS: Brain: No evidence of acute infarction, hemorrhage,  hydrocephalus, extra-axial collection or mass lesion/mass effect. Sequela of moderate chronic microvascular ischemic change. Unchanged small cystic lesion in the left temporal lobe (series 2, image 17), previously favored to represent a chronic evolving hemorrhage. Vascular: No hyperdense vessel or unexpected calcification. Skull: Normal. Negative for fracture or focal lesion. Sinuses/Orbits: No middle ear or mastoid effusion. Paranasal sinuses are clear. Orbits are notable for bilateral lens replacement, otherwise unremarkable. Other: None. ASPECTS [facility] Stroke Program Early CT Score):: 10 IMPRESSION: No acute hemorrhage or CT evidence of an acute cortical infarct. Findings were paged to Dr. Pearlean Brownie on 02/04/23 at 3:17 PM via [facility] paging system. Electronically Signed   By: Lorenza Cambridge M.D.   On: 02/04/2023 15:17      Signature  -   Susa Raring M.D on 02/07/2023 at 8:57 AM   -  To page go to [email or web address]

## 2023-02-07 NOTE — Progress Notes (Signed)
vLTM maintenance  All impedances below 10kohms.  No skin breakdown noted at FP1  FP1  F7 F8

## 2023-02-07 NOTE — Plan of Care (Signed)
  Problem: Education: Goal: Expressions of having a comfortable level of knowledge regarding the disease process will increase Outcome: Progressing   Problem: Coping: Goal: Ability to adjust to condition or change in health will improve Outcome: Progressing Goal: Ability to identify appropriate support needs will improve Outcome: Progressing   Problem: Health Behavior/Discharge Planning: Goal: Compliance with prescribed medication regimen will improve Outcome: Progressing   Problem: Medication: Goal: Risk for medication side effects will decrease Outcome: Progressing   Problem: Clinical Measurements: Goal: Complications related to the disease process, condition or treatment will be avoided or minimized Outcome: Progressing Goal: Diagnostic test results will improve Outcome: Progressing   Problem: Safety: Goal: Verbalization of understanding the information provided will improve Outcome: Progressing   Problem: Self-Concept: Goal: Level of anxiety will decrease Outcome: Progressing Goal: Ability to verbalize feelings about condition will improve Outcome: Progressing   Problem: Education: Goal: Knowledge of General Education information will improve Description: Including pain rating scale, medication(s)/side effects and non-pharmacologic comfort measures Outcome: Progressing   Problem: Health Behavior/Discharge Planning: Goal: Ability to manage health-related needs will improve Outcome: Progressing   Problem: Clinical Measurements: Goal: Ability to maintain clinical measurements within normal limits will improve Outcome: Progressing Goal: Will remain free from infection Outcome: Progressing Goal: Diagnostic test results will improve Outcome: Progressing Goal: Respiratory complications will improve Outcome: Progressing Goal: Cardiovascular complication will be avoided Outcome: Progressing   Problem: Activity: Goal: Risk for activity intolerance will  decrease Outcome: Progressing   Problem: Nutrition: Goal: Adequate nutrition will be maintained Outcome: Progressing   Problem: Coping: Goal: Level of anxiety will decrease Outcome: Progressing   Problem: Elimination: Goal: Will not experience complications related to bowel motility Outcome: Progressing Goal: Will not experience complications related to urinary retention Outcome: Progressing   Problem: Pain Managment: Goal: General experience of comfort will improve Outcome: Progressing   Problem: Safety: Goal: Ability to remain free from injury will improve Outcome: Progressing   Problem: Skin Integrity: Goal: Risk for impaired skin integrity will decrease Outcome: Progressing   Problem: Safety: Goal: Non-violent Restraint(s) Outcome: Progressing   

## 2023-02-07 NOTE — Evaluation (Signed)
Speech Language Pathology Evaluation Patient Details Name: FRED MCCANLESS MRN: [id number] DOB: October 23, 1940 Today's Date: 02/07/2023 Time: 4098-1191 SLP Time Calculation (min) (ACUTE ONLY): 18 min  Problem List:  Patient Active Problem List   Diagnosis Date Noted   Aphasia 02/04/2023   Seizure (HCC): Unwitnessed 02/04/2023   Adnexal mass 01/09/2021   Encounter for immunotherapy    Intracerebral hemorrhage 09/17/2019   Intracranial bleed (HCC) 09/16/2019   Encephalopathy acute    Essential hypertension 09/15/2019   Dyslipidemia 09/15/2019   Acute ITP (HCC)    Thrombocytopenia (HCC) 08/30/2019   Anemia    CLL (chronic lymphocytic leukemia) (HCC)    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   Past Medical History:  Past Medical History:  Diagnosis Date   Anxiety    CLL (chronic lymphocytic leukemia) (HCC) 11/2018   Glaucoma    Hyperlipemia    Hypertension    Osteoporosis    Past Surgical History: History reviewed. No pertinent surgical history. 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  Head CT and brain MRI were negative for acute abnormalities.  She has no previous history of seizures, no family history of seizures, no history of brain surgery, no history of meningitis or encephalitis, and history of 1 fall in which she hit her head and had to have stitches but no other serious head injuries.  At baseline, she is alert and oriented, able to do all of her ADLs and cares for her husband.   Assessment / Plan / Recommendation Clinical Impression  Pt demonstrates adeqaute arousal, able to track visitor into both visual fields. Seems to be right handed and can use right hand for self directed purposeful tasks such as removing her own restraints or grasping a cup. Otherwise she is unable to be reidrected to follow commands or respond  to questions. She has been asked her name several times this morning and can reply, but now generalized all auditory stimuli as that question. SHe is frusterated, shoutig her name repeatedly and asking why we can hear her. She is improving. May need cognitive rehabilitation after d/c. Will follow while admitted.    SLP Assessment  SLP Recommendation/Assessment: Patient needs continued Speech Lanaguage Pathology Services SLP Visit Diagnosis: Cognitive communication deficit (R41.841)    Recommendations for follow up therapy are one component of a multi-disciplinary discharge planning process, led by the attending physician.  Recommendations may be updated based on patient status, additional functional criteria and insurance authorization.    Follow Up Recommendations  Acute inpatient rehab (3hours/day)    Assistance Recommended at Discharge     Functional Status Assessment    Frequency and Duration min 2x/week  2 weeks      SLP Evaluation Cognition  Overall Cognitive Status: Impaired/Different from baseline Arousal/Alertness: Awake/alert Orientation Level: Oriented to person;Disoriented to place;Disoriented to time;Disoriented to situation Attention: Focused Focused Attention: Impaired Focused Attention Impairment: Verbal basic;Functional basic       Comprehension  Auditory Comprehension Overall Auditory Comprehension: Impaired Yes/No Questions: Impaired Basic Biographical Questions: 0-25% accurate Commands: Impaired    Expression Verbal Expression Overall Verbal Expression: Impaired Initiation: No impairment Automatic Speech: Name Pragmatics: Impairment   Oral / Motor  Motor Speech Overall Motor Speech: Appears within functional limits for tasks assessed            Kenyatte Chatmon, Riley Nearing 02/07/2023, 9:49 AM

## 2023-02-08 DIAGNOSIS — R569 Unspecified convulsions: Secondary | ICD-10-CM | POA: Diagnosis not present

## 2023-02-08 LAB — BASIC METABOLIC PANEL
Anion gap: 9 (ref 5–15)
BUN: <5 mg/dL — ABNORMAL LOW (ref 8–23)
CO2: 21 mmol/L — ABNORMAL LOW (ref 22–32)
Calcium: 8.2 mg/dL — ABNORMAL LOW (ref 8.9–10.3)
Chloride: 108 mmol/L (ref 98–111)
Creatinine, Ser: 0.61 mg/dL (ref 0.44–1.00)
GFR, Estimated: >60 mL/min (ref >60)
Glucose, Bld: 114 mg/dL — ABNORMAL HIGH (ref 70–99)
Potassium: 3.7 mmol/L (ref 3.5–5.1)
Sodium: 138 mmol/L (ref 135–145)

## 2023-02-08 LAB — CBC WITH DIFFERENTIAL/PLATELET
Abs Immature Granulocytes: 0.01 10*3/uL (ref 0.00–0.07)
Basophils Absolute: 0 10*3/uL (ref 0.0–0.1)
Basophils Relative: 0 %
Eosinophils Absolute: 0.3 10*3/uL (ref 0.0–0.5)
Eosinophils Relative: 4 %
HCT: 40.9 % (ref 36.0–46.0)
Hemoglobin: 14.1 g/dL (ref 12.0–15.0)
Immature Granulocytes: 0 %
Lymphocytes Relative: 34 %
Lymphs Abs: 2.8 10*3/uL (ref 0.7–4.0)
MCH: 30.7 pg (ref 26.0–34.0)
MCHC: 34.5 g/dL (ref 30.0–36.0)
MCV: 89.1 fL (ref 80.0–100.0)
Monocytes Absolute: 0.7 10*3/uL (ref 0.1–1.0)
Monocytes Relative: 8 %
Neutro Abs: 4.4 10*3/uL (ref 1.7–7.7)
Neutrophils Relative %: 54 %
Platelets: 106 10*3/uL — ABNORMAL LOW (ref 150–400)
RBC: 4.59 MIL/uL (ref 3.87–5.11)
RDW: 12.7 % (ref 11.5–15.5)
WBC: 8.2 10*3/uL (ref 4.0–10.5)
nRBC: 0 % (ref 0.0–0.2)

## 2023-02-08 LAB — HEMOGLOBIN A1C
Hgb A1c MFr Bld: 5.6 % (ref 4.8–5.6)
Mean Plasma Glucose: 114 mg/dL

## 2023-02-08 LAB — BRAIN NATRIURETIC PEPTIDE: B Natriuretic Peptide: 89.9 pg/mL (ref 0.0–100.0)

## 2023-02-08 LAB — MAGNESIUM: Magnesium: 2 mg/dL (ref 1.7–2.4)

## 2023-02-08 MED ORDER — ELTROMBOPAG OLAMINE 25 MG PO TABS
25.0000 mg | ORAL_TABLET | ORAL | Status: DC
  Administered 2023-02-09 – 2023-02-10 (×2): 25 mg via ORAL
  Filled 2023-02-08 (×2): qty 1

## 2023-02-08 MED ORDER — ELTROMBOPAG OLAMINE 25 MG PO TABS: 25.0000 mg | ORAL_TABLET | ORAL | Status: DC

## 2023-02-08 NOTE — TOC Initial Note (Signed)
Transition of Care [facility]) - Initial/Assessment Note    Patient Details  Name: Selena Branch MRN: [id number] Date of Birth: 20-Sep-1940  [facility] CM/SW Contact:    Mearl Latin, LCSW Phone Number: 02/08/2023, 3:05 PM  Clinical Narrative:                 CSW received consult for possible SNF placement at time of discharge. CSW spoke with patient's daughter. She reported patient's spouse is currently staying with her in [place]. She reported that patient's spouse is currently unable to care for patient at their home given patient's current physical needs and fall risk. She expressed understanding of PT recommendation and is agreeable to SNF placement at time of discharge. Patient reports preference for a SNF in [place]. CSW discussed insurance authorization process and will provide Medicare SNF ratings list. CSW will send out referrals for review and provide bed offers as available.   Skilled Nursing Rehab Facilities-   [email or web address]   Ratings out of 5 stars (5 the highest)   Name Address  Phone # Quality Care Staffing Health Inspection Overall  [facility] [address] [phone] 2 1 5 4   [facility] [address] [place] [phone] 4 1 3 2   [facility] [address], [place] [phone] 2 1 1 1   [facility] [address] [place] [phone] 4 1 3 2   [facility]  [address], [place] [phone] 3 2 5 5   [facility] [address] [facility] [phone] 2 1 2 1   [facility] [address] [place] [phone] 4 1 2 1   [facility] [address] [place] [phone] 2 4 3 3   [address] ([facility]) [address], [place] [phone] 3 2 2 2   [facility] [address] [place], [place] [phone] 1 2 1 1   [facility] [address], [place] [phone] 3 1 1 1   Eligha Bridegroom  [address] [place] [phone] 4 3 4 4   [facility] [address], [place] [phone] 3 4 3 3           [facility] [address], [place] [phone]      [facility] GY[facility]UDJSH, [place] [address], [place] [phone] 1 1 2 1   [facility] [address], [place] [phone] 2 2 4 4   [facility] [address] [phone] 2 1 4 3   [facility] [address] [place] [phone] 3 3 3 3           [address] (no [facility]) [address], [place] [phone] 4 4 5 5   [facility] (No Hum[employer]) [address] Lavera Guise [phone] 2 2 4 4   [facility] [address] [place] [phone] 2 1 2 1   [facility] (No [employer]) [address], [place] [phone] 5 5 5 5   [facility] [address] [place] [phone] 2 3 5 5   [facility] [address] [place] [phone] 2 1 1 1   [facility] [address] [place] [phone] 5 2 5 5   [facility]  [address] [place] [phone] 2 2 2 2   [facility] [address] [place] [phone] 4 2 1 1   [facility] [address] [place], [place] [phone] 2 2 3 3           [facility] [address] [place] [phone] 1 1 1  1  [facility] [address] Evlyn Clines  [phone] 2 3 3 3   [facility] (No Humana) [address], [place] [phone] 2 1 3 2    [facility]) [address], [place]al[place] Levan [phone] 1 1 1 1   [facility] [address] [place]an [phone] [facility] [place] [address], [place] [phone] 2 1 1 1           [facility] [address] [place], [place] [phone] 4 4 5 5   [facility])  [address] [place] [phone] 2 1 2 1   [facility]) [address] [place], [place] [phone]  1 4 3   [facility] [address], [place] [phone]  3 5 4 5   [address] [place], [place] [phone] 3 2 2 2   [facility]) [address] [place] [phone] 2 1 3 2      Expected Discharge Plan: Skilled Nursing Facility Barriers to Discharge: Continued Medical Work up, English as a second language teacher, Requiring sitter/restraints, SNF Pending bed offer   Patient Goals and CMS Choice Patient states their goals for this hospitalization and ongoing recovery are:: Rehab CMS Medicare.gov Compare Post Acute Care list provided to:: Patient Represenative (must comment) Choice offered to / list presented to : Adult Children, Spouse [place] ownership interest in [facility].provided to:: Adult Children    Expected Discharge Plan and Services In-house Referral: Clinical Social Work   Post Acute Care Choice: Skilled Nursing Facility Living arrangements for the past 2 months: Single Family Home                                      Prior Living Arrangements/Services Living arrangements for the past 2 months: Single Family Home Lives with:: Spouse Patient language and need for interpreter reviewed:: Yes Do you feel safe going back to the place where you live?: Yes      Need for Family Participation in Patient Care: Yes (Comment) Care giver support system in place?: Yes (comment)   Criminal Activity/Legal Involvement Pertinent to Current Situation/Hospitalization: No - Comment as needed  Activities of Daily Living      Permission Sought/Granted Permission sought to share information with : Facility Medical sales representative, Family Supports Permission granted to share information with : Yes, Verbal Permission Granted  Share Information with NAME: Arline Asp  Permission granted to share info w AGENCY: SNFs  Permission granted to share info w Relationship: Daughter  Permission granted to share info w Contact Information: [phone]  Emotional Assessment Appearance:: Appears stated  age Attitude/Demeanor/Rapport: Unable to Assess Affect (typically observed): Unable to Assess Orientation: : Oriented to Self Alcohol / Substance Use: Not Applicable Psych Involvement: No (comment)  Admission diagnosis:  Aphasia [R47.01] Patient Active Problem List   Diagnosis Date Noted   Aphasia 02/04/2023   Seizure [facility]): Unwitnessed 02/04/2023   Adnexal mass 01/09/2021   Encounter for immunotherapy    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, [place] - [address] [facility] [address] [place] [address] Phone: [phone] Fax: [phone]  [place] [facility] [address] [place] [address] Phone: [phone] Fax: [phone]  [facility] [place], [place] - [address] [place] [address] Phone: [phone] Fax: [phone]     Social Determinants of Health (SDOH) Social History: SDOH Screenings   Depression (PHQ2-9): Low Risk  (09/28/2019)  Tobacco Use: Low Risk  (02/04/2023)   SDOH Interventions:     Readmission Risk Interventions     No data to display

## 2023-02-08 NOTE — Progress Notes (Signed)
PROGRESS NOTE                                                                                                                                                                                                             Patient Demographics:    Selena Branch, is a 82 y.o. female, DOB - 08-22-1941, UJW:[id number]  Outpatient Primary MD for the patient is Gweneth Dimitri, MD    LOS - 3  Admit date - 02/04/2023    Chief Complaint  Patient presents with   Code Stroke       Brief Narrative (HPI from H&P)   82 y.o. female with medical history significant of CLL currently in remission, glaucoma, hypertension, thrombocytopenia, hyperlipidemia presenting to the ED with sudden onset altered mental status/aphasia, this happened while she was on the phone at home, she was doing fine but suddenly became unresponsive and confused.  She was brought to the ER where she was seen by neurology and admitted for evaluation of expressive aphasia and possible stroke workup.   Subjective:   Patient in bed, still minimally confused but denies any headache chest or abdominal pain.   Assessment  & Plan :   #1  Metabolic encephalopathy, expressive aphasia.  Due to status epilepticus, MRI brain, CTA head and neck unremarkable, on Keppra & Vimpat, case discussed with neurology 02/07/23, placed on IV fluids, PT, OT speech eval.  Further medication adjustment per neurology, may need LP if seizures remain out of control with her underlying history of CLL.  Will defer management of this issue to neurology, question if she has some hospital-acquired delirium on top of her seizures and postictal state.   2.  Glaucoma   - resume home eyedrops.   3.  Hypertension  - Patient aphasic, not opening her mouth or answering any questions. Placed on as needed IV hydralazine.   4.  Hyperlipidemia  - Hold statin.  When more alert and tolerating oral intake we will resume  statin.   5.  CLL/thrombocytopenia  - Stable.  Outpatient follow-up with oncology.   6.  History of intracerebral hemorrhage  - Stable.  7.  Urinary retention with possible UTI.  Foley placed, Flomax, 3 days of Rocephin.  8.  Hypokalemia.  Replaced.      Condition - Extremely Guarded  Family Communication  : [name]  [phone]  called and informed in detail on 02/05/2023, daughter, husband updated bedside 02/07/2023, daughter and husband bedside 02/08/2023  Code Status :  DNR  Consults  :  Neuro  PUD Prophylaxis : PPI   Procedures  :     MRI brain, CTA head and neck.  No stroke, no significant occlusion in any vessel.  EEG.  Positive for possible status epilepticus.      Disposition Plan  :    Status is: Inpatient  DVT Prophylaxis  :    heparin injection 5,000 Units Start: 02/05/23 1400 SCDs Start: 02/04/23 1729    Lab Results  Component Value Date   PLT 106 (L) 02/08/2023    Diet :  Diet Order             Diet full liquid Room service appropriate? No; Fluid consistency: Thin  Diet effective now                    Inpatient Medications  Scheduled Meds:  amLODipine  10 mg Oral QHS   atorvastatin  40 mg Oral Daily   carvedilol  3.125 mg Oral BID WC   chlorhexidine gluconate (MEDLINE KIT)  15 mL Mouth Rinse BID   Chlorhexidine Gluconate Cloth  6 each Topical Daily   dorzolamide-timolol  1 drop Both Eyes Daily   [START ON 02/09/2023] eltrombopag  25 mg Oral Once per day on Mon Wed Fri   heparin injection (subcutaneous)  5,000 Units Subcutaneous Q8H   latanoprost  1 drop Both Eyes QHS   mouth rinse  15 mL Mouth Rinse 10 times per day   pantoprazole (PROTONIX) IV  40 mg Intravenous QHS   potassium chloride  40 mEq Oral Once   sertraline  25 mg Oral Daily   sodium chloride flush  3 mL Intravenous Q12H   tamsulosin  0.4 mg Oral Daily   Continuous Infusions:  cefTRIAXone (ROCEPHIN)  IV Stopped (02/08/23 0981)   lacosamide (VIMPAT) IV Stopped  (02/07/23 2222)   levETIRAcetam Stopped (02/07/23 2102)   PRN Meds:.acetaminophen **OR** acetaminophen, hydrALAZINE, LORazepam, ondansetron **OR** ondansetron (ZOFRAN) IV, polyethylene glycol, sorbitol  Antibiotics  :    Anti-infectives (From admission, onward)    Start     Dose/Rate Route Frequency Ordered Stop   02/08/23 0600  cefTRIAXone (ROCEPHIN) 1 g in sodium chloride 0.9 % 100 mL IVPB        1 g 200 mL/hr over 30 Minutes Intravenous Every 24 hours 02/07/23 0600 02/10/23 0559   02/07/23 0700  cefTRIAXone (ROCEPHIN) 1 g in sodium chloride 0.9 % 100 mL IVPB        1 g 200 mL/hr over 30 Minutes Intravenous  Once 02/07/23 0601 02/07/23 0645         Objective:   Vitals:   02/07/23 2200 02/08/23 0000 02/08/23 0741 02/08/23 0854  BP:  (!) 148/57  (!) 146/72  Pulse: 66 66  73  Resp: 17 16  15   Temp:    (!) 97.4 F (36.3 C)  TempSrc:   Oral Oral  SpO2: 100% 100% 96% 99%  Weight:        Wt Readings from Last 3 Encounters:  02/04/23 49.2 kg  10/29/22 48.3 kg  07/30/22 48.8 kg     Intake/Output Summary (Last 24 hours) at 02/08/2023 0906 Last data filed at 02/08/2023 0653 Gross per 24 hour  Intake 1825.64 ml  Output 3100 ml  Net -1274.36 ml     Physical Exam  Awake but still minimally confused, denies any headache, moving all 4 extremities  [place].AT,PERRAL Supple Neck, No JVD,   Symmetrical Chest wall movement, Good air movement bilaterally, CTAB RRR,No Gallops,Rubs or new Murmurs,  +ve B.Sounds, Abd Soft, No tenderness,   No Cyanosis, Clubbing or edema       Data Review:    Recent Labs  Lab 02/04/23 1501 02/04/23 1508 02/05/23 0310 02/06/23 0400 02/07/23 0342 02/08/23 0213  WBC 8.2  --  9.1 8.8 8.1 8.2  HGB 14.6 14.6 14.2 12.7 13.2 14.1  HCT 43.6 43.0 41.2 37.7 38.4 40.9  PLT 116*  --  90* 101* 94* 106*  MCV 93.0  --  92.4 92.0 92.1 89.1  MCH 31.1  --  31.8 31.0 31.7 30.7  MCHC 33.5  --  34.5 33.7 34.4 34.5  RDW 12.9  --  12.8 12.9 12.5 12.7   LYMPHSABS 4.3*  --  1.5 3.1 2.1 2.8  MONOABS 0.6  --  0.5 0.7 0.7 0.7  EOSABS 0.2  --  0.0 0.1 0.2 0.3  BASOSABS 0.0  --  0.0 0.0 0.0 0.0    Recent Labs  Lab 02/01/23 0923 02/01/23 0923 02/04/23 1501 02/04/23 1508 02/05/23 0310 02/05/23 0621 02/06/23 0400 02/07/23 0342 02/08/23 0213  NA 144  --  139 140 139  --  137 139 138  K 3.9  --  3.5 3.7 3.6  --  3.3* 2.9* 3.7  CL 107  --  104 104 106  --  106 108 108  CO2 29  --  26  --  22  --  24 23 21*  ANIONGAP 8  --  9  --  11  --  7 8 9   GLUCOSE 108*  --  155* 155* 124*  --  111* 119* 114*  BUN 18  --  15 17 14   --  9 5* <5*  CREATININE 0.81   < > 0.99 1.00 0.76  --  0.66 0.60 0.61  AST 21  --  28  --  30  --   --   --   --   ALT 9  --  14  --  22  --   --   --   --   ALKPHOS 53  --  53  --  42  --   --   --   --   BILITOT 0.5  --  0.5  --  0.9  --   --   --   --   ALBUMIN 4.3  --  4.2  --  4.0  --   --   --   --   INR  --   --  1.0  --   --   --   --   --   --   TSH  --   --   --   --   --  0.618  --   --   --   HGBA1C  --   --   --   --   --  5.6  --   --   --   BNP  --   --   --   --   --   --  286.2* 154.1* 89.9  MG  --   --   --   --  1.9  --  2.1 1.9 2.0  CALCIUM 9.3  --  9.5  --  8.6*  --  7.8* 7.7* 8.2*   < > =  values in this interval not displayed.     Lab Results  Component Value Date   CHOL 139 02/05/2023   HDL 64 02/05/2023   LDLCALC 69 02/05/2023   TRIG 31 02/05/2023   CHOLHDL 2.2 02/05/2023    Lab Results  Component Value Date   HGBA1C 5.6 02/05/2023      Radiology Reports Overnight EEG with video  Result Date: 02/05/2023 Charlsie Quest, MD     02/07/2023  4:52 AM Patient Name: HAYA RINDAL MRN: [id number] Epilepsy Attending: Charlsie Quest Referring Physician/Provider: Marjorie Smolder, NP Duration: 02/04/2023 1629 02/05/2023 1629 Patient history: 82 y.o. female with a past medical history of CLL, ITP, left parietal ICH in 2021 presenting with aphasia and generalized weakness. EEG to  evaluate for seizure. Level of alertness: lethargic AEDs during EEG study: LEV Technical aspects: This EEG study was done with scalp electrodes positioned according to the 10-20 International system of electrode placement. Electrical activity was reviewed with band pass filter of 1-70Hz , sensitivity of 7 uV/mm, display speed of 45mm/sec with a 60Hz  notched filter applied as appropriate. EEG data were recorded continuously and digitally stored.  Video monitoring was available and reviewed as appropriate. Description: At the beginning of study, EEG showed lateralized periodic discharges in left hemisphere, maximal left parietal region at 2.5-3Hz  admixed with 3-5hz  theta-delta slowing and overriding 13-15hz  beta activity. Clinically patient was aphasic per neurologist. 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 Patient was loaded with Keppra  on 02/04/2023 at 1716 and subsequently status epilepticus resolved. After around 1830, EEG showed continuous generalized and lateralized left hemisphere 3 to 6 Hz theta-delta slowing. Lateralized periodic discharges at 0.5-1hz  were also noted arising from left hemisphere. Hyperventilation and photic stimulation were not performed.   EEG was difficult to interpret between 02/04/2023 1946 to 02/05/2023 1010 due to significant electrode artifact. 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 Clinically, per neurologist patient was aphasic. Patient was loaded with keppra  on 02/04/2023 at 1716 and subsequently status epilepticus resolved. EEG was then suggestive of epileptogenicity and cortical dysfunction arising from left hemisphere likely secondary  to underlying structural abnormality and increased risk of seizure recurrence. Lastly there is moderate diffuse encephalopathy. Charlsie Quest   DG Chest Port 1 View  Result Date: 02/04/2023 CLINICAL DATA:  Altered mental status EXAM: PORTABLE CHEST 1 VIEW COMPARISON:  CT 12/01/2020 FINDINGS: Low lung volumes. Patchy retrocardiac opacity. No pleural effusion. Normal cardiac size. Aortic atherosclerosis IMPRESSION: Low lung volumes with patchy retrocardiac opacity, atelectasis versus pneumonia. Electronically Signed   By: Jasmine Pang M.D.   On: 02/04/2023 17:22   MR BRAIN WO CONTRAST  Result Date: 02/04/2023 CLINICAL DATA:  Neuro deficit, acute, stroke suspected. EXAM: MRI HEAD WITHOUT CONTRAST TECHNIQUE: Multiplanar, multiecho pulse sequences of the brain and surrounding structures were obtained without intravenous contrast. COMPARISON:  Head CT 02/04/2023 and older.  MRI brain 01/14/2016. FINDINGS: Brain: No acute infarct or hemorrhage. Sequela of prior hemorrhage in the left superior temporal gyrus. Old microhemorrhage in the posterior right temporal lobe. No hydrocephalus or extra-axial collection. No mass or midline shift. Vascular: Normal flow voids. Skull and upper cervical spine: Normal marrow signal. Sinuses/Orbits: Unremarkable. Other: None. IMPRESSION: No acute intracranial process. Electronically Signed   By:  Orvan Falconer M.D.   On: 02/04/2023 16:10   CT ANGIO HEAD NECK W WO CM (CODE STROKE)  Result Date: 02/04/2023 CLINICAL DATA:  Neuro deficit, acute, stroke suspected right sided weakness, aphasia. EXAM: CT ANGIOGRAPHY HEAD AND NECK WITH AND WITHOUT CONTRAST TECHNIQUE: Multidetector CT imaging of the head and neck was performed using the standard protocol during bolus administration of intravenous contrast. Multiplanar CT image reconstructions and MIPs were obtained to evaluate the vascular anatomy. Carotid stenosis measurements (when applicable) are obtained utilizing NASCET  criteria, using the distal internal carotid diameter as the denominator. RADIATION DOSE REDUCTION: This exam was performed according to the departmental dose-optimization program which includes automated exposure control, adjustment of the mA and/or kV according to patient size and/or use of iterative reconstruction technique. CONTRAST:  50mL OMNIPAQUE IOHEXOL 350 MG/ML SOLN COMPARISON:  Head CT 02/04/2023.  MRI brain 01/14/2016. FINDINGS: CTA NECK FINDINGS Aortic arch: Three-vessel arch configuration. Atherosclerotic calcifications of the aortic arch and arch vessel origins. Arch vessel origins are patent. Right carotid system: No evidence of dissection, stenosis (50% or greater), or occlusion. Left carotid system: No evidence of dissection, stenosis (50% or greater), or occlusion. Vertebral arteries: Codominant. No evidence of dissection, stenosis (50% or greater), or occlusion. 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 Other neck: Unremarkable. Upper chest: Unremarkable. Review of the MIP images confirms the above findings CTA HEAD FINDINGS Anterior circulation: Intracranial ICAs are patent without stenosis or aneurysm. The proximal ACAs and MCAs are patent without stenosis or aneurysm. Distal branches are symmetric. Posterior circulation: Normal basilar artery. The SCAs, AICAs and PICAs are patent proximally. The PCAs are patent proximally without stenosis or aneurysm. Distal branches are symmetric. Venous sinuses: Patent. 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 Review of the MIP images confirms the above findings IMPRESSION: No large vessel occlusion, hemodynamically significant stenosis or aneurysm of the head or neck vessels. Aortic Atherosclerosis (ICD10-I70.0). Electronically Signed   By: Orvan Falconer M.D.   On: 02/04/2023 15:42   CT HEAD CODE STROKE WO CONTRAST  Result Date:  02/04/2023 CLINICAL DATA:  Code stroke. Neuro deficit, acute, stroke suspected no lateralizing information provided. EXAM: CT HEAD WITHOUT CONTRAST TECHNIQUE: Contiguous axial images were obtained from the base of the skull through the vertex without intravenous contrast. RADIATION DOSE REDUCTION: This exam was performed according to the departmental dose-optimization program which includes automated exposure control, adjustment of the mA and/or kV according to patient size and/or use of iterative reconstruction technique. COMPARISON:  CT Head 11/14/19 FINDINGS: Brain: No evidence of acute infarction, hemorrhage, hydrocephalus, extra-axial collection or mass lesion/mass effect. Sequela of moderate chronic microvascular ischemic change. Unchanged small cystic lesion in the left temporal lobe (series 2, image 17), previously favored to represent a chronic evolving hemorrhage. Vascular: No hyperdense vessel or unexpected calcification. Skull: Normal. Negative for fracture or focal lesion. Sinuses/Orbits: No middle ear or mastoid effusion. Paranasal sinuses are clear. Orbits are notable for bilateral lens replacement, otherwise unremarkable. Other: None. ASPECTS [facility] Stroke Program Early CT Score):: 10 IMPRESSION: No acute hemorrhage or CT evidence of an acute cortical infarct. Findings were paged to Dr. Pearlean Brownie on 02/04/23 at 3:17 PM via [facility] paging system. Electronically Signed   By: Lorenza Cambridge M.D.   On: 02/04/2023 15:17      Signature  -   Susa Raring M.D on 02/08/2023 at 9:06 AM   -  To page  go to [email or web address]

## 2023-02-08 NOTE — Evaluation (Addendum)
Physical Therapy Evaluation Patient Details Name: Selena Branch MRN: [id number] DOB: May 29, 1941 Today's Date: 02/08/2023  History of Present Illness  [name] is a 82 y.o. female admitted to [facility] on 5/24 with sudden onset altered mental status/aphasia. WUJ:[id number]; EEG: positive for seizures. PMHx: CLL currently in remission, glaucoma, hypertension, thrombocytopenia, hyperlipidemia GNF:[id number]; EEG: positive for seizures.  Clinical Impression  Pt presents today with impaired functional mobility, limited by cognition, strength, balance, and activity tolerance. Pt is a poor historian and no family present to provide PLOF, but per chart review, pt is ambulatory and resides at home with her husband who she was caring for. Pt's cognition seems improved from previous days, intermittently following commands with multimodal cues and occasionally answering questions, however perseverating with answers and occasionally movements. Pt eager to sit EOB today, requiring minA for trunk support and balance along with line management. Pt able to stand at EOB with minA for 5 trials, impulsively standing but requiring assist for safety and balance along with line management. Pt occasionally trying to get restraint off of her wrist, requiring close guard and supervision to ensure line safety. Pt will continue to benefit from skilled acute PT to progress mobility and address deficits, recommend subacute PT upon discharge but pt does demonstrate potential to progress to higher intensity PT pending continued progression in cognition and participation in therapy. Acute PT will follow as appropriate.        Recommendations for follow up therapy are one component of a multi-disciplinary discharge planning process, led by the attending physician.  Recommendations may be updated based on patient status, additional functional criteria and insurance authorization.  Follow Up Recommendations Can patient physically be transported  by private vehicle: No     Assistance Recommended at Discharge Frequent or constant Supervision/Assistance  Patient can return home with the following  A lot of help with walking and/or transfers;A lot of help with bathing/dressing/bathroom;Direct supervision/assist for medications management;Direct supervision/assist for financial management;Assistance with cooking/housework;Assist for transportation;Help with stairs or ramp for entrance    Equipment Recommendations Other (comment) (defer to next level of care)  Recommendations for Other Services       Functional Status Assessment Patient has had a recent decline in their functional status and demonstrates the ability to make significant improvements in function in a reasonable and predictable amount of time.     Precautions / Restrictions Precautions Precautions: Fall;Other (comment) Precaution Comments: seizure Restrictions Weight Bearing Restrictions: No      Mobility  Bed Mobility Overal bed mobility: Needs Assistance Bed Mobility: Supine to Sit, Sit to Supine     Supine to sit: Min assist, HOB elevated Sit to supine: Max assist, HOB elevated   General bed mobility comments: pt eager to sit EOB, minA provided for safety with balance and trunk support along with line management, maxA provided to return to supine for BLE management and trunk support    Transfers Overall transfer level: Needs assistance Equipment used: 1 person hand held assist Transfers: Sit to/from Stand Sit to Stand: Min assist           General transfer comment: Pt standing at EOB with minA x5 reps. Attempted weight shifting and side steps but pt not following commands to attempt    Ambulation/Gait               General Gait Details: limited by cognition and with continuous EEG  Stairs            Wheelchair Mobility  Modified Rankin (Stroke Patients Only)       Balance Overall balance assessment: Needs  assistance Sitting-balance support: Bilateral upper extremity supported, Feet supported Sitting balance-Leahy Scale: Fair Sitting balance - Comments: minG to minA for balance at EOB due to safety concerns   Standing balance support: Single extremity supported Standing balance-Leahy Scale: Fair Standing balance comment: minG to minA provided for safety and balance with intermittent 1UE support                             Pertinent Vitals/Pain Pain Assessment Pain Assessment: Faces Faces Pain Scale: No hurt    Home Living Family/patient expects to be discharged to:: Skilled nursing facility                   Additional Comments: Pt poor historian today, no family present but lives with husband and was ambulatory and taking care of him prior to admission    Prior Function Prior Level of Function : Patient poor historian/Family not available             Mobility Comments: per chart review, pt was ambulatory prior to admission. Pt reports having a SPC and RW and utilizing but questionable       Hand Dominance        Extremity/Trunk Assessment   Upper Extremity Assessment Upper Extremity Assessment: Defer to OT evaluation    Lower Extremity Assessment Lower Extremity Assessment: Generalized weakness;Difficult to assess due to impaired cognition (pt extending knees against gravity but unable to fully assess as pt not following commands for formal MMT. Pt tapping toes with cueing but no hip flexion performed due to cognition)    Cervical / Trunk Assessment Cervical / Trunk Assessment: Kyphotic  Communication   Communication: No difficulties (perseverating but vocalizing and answering some questions intermittently)  Cognition Arousal/Alertness: Awake/alert Behavior During Therapy: Flat affect Overall Cognitive Status: Impaired/Different from baseline Area of Impairment: Orientation, Memory, Following commands, Safety/judgement, Problem solving                  Orientation Level: Disoriented to, Place, Time, Situation   Memory: Decreased recall of precautions, Decreased short-term memory Following Commands: Follows one step commands inconsistently, Follows one step commands with increased time Safety/Judgement: Decreased awareness of safety, Decreased awareness of deficits   Problem Solving: Slow processing, Difficulty sequencing, Requires verbal cues, Requires tactile cues General Comments: Pt able to state her name after some time but not her birthday, pt reports she knows where she is but unable to state but does report she lives about 20 minutes away. Pt perseverates intermittently, reporting she lives 20 minutes away to a few questions. Pt intermittently following commands with multimodal cues but will also perseverate with movements, continuously extending knee when attempting to get pt to reaching with UE        General Comments General comments (skin integrity, edema, etc.): Pt reports feeling sick at the end of the session, BP 170/81, RN notified. Pt on 2L O2 [place], stable SPO2    Exercises     Assessment/Plan    PT Assessment Patient needs continued PT services  PT Problem List Decreased strength;Decreased activity tolerance;Decreased balance;Decreased mobility;Decreased coordination;Decreased cognition;Decreased knowledge of use of DME;Decreased safety awareness;Decreased knowledge of precautions       PT Treatment Interventions DME instruction;Gait training;Stair training;Functional mobility training;Therapeutic activities;Therapeutic exercise;Balance training;Neuromuscular re-education;Patient/family education;Cognitive remediation    PT Goals (Current goals can be found in the  Care Plan section)  Acute Rehab PT Goals Patient Stated Goal: let me up PT Goal Formulation: Patient unable to participate in goal setting Time For Goal Achievement: 02/22/23 Potential to Achieve Goals: Fair    Frequency Min 3X/week      Co-evaluation               AM-PAC PT "6 Clicks" Mobility  Outcome Measure Help needed turning from your back to your side while in a flat bed without using bedrails?: A Lot Help needed moving from lying on your back to sitting on the side of a flat bed without using bedrails?: A Lot Help needed moving to and from a bed to a chair (including a wheelchair)?: A Lot Help needed standing up from a chair using your arms (e.g., wheelchair or bedside chair)?: A Little Help needed to walk in hospital room?: Total Help needed climbing 3-5 steps with a railing? : Total 6 Click Score: 11    End of Session Equipment Utilized During Treatment: Oxygen Activity Tolerance: Patient tolerated treatment well Patient left: in bed;with call bell/phone within reach;with bed alarm set;with restraints reapplied Nurse Communication: Mobility status PT Visit Diagnosis: Muscle weakness (generalized) (M62.81);Other abnormalities of gait and mobility (R26.89);Unsteadiness on feet (R26.81)    Time: 4098-1191 PT Time Calculation (min) (ACUTE ONLY): 17 min   Charges:   PT Evaluation $PT Eval Moderate Complexity: 1 Mod          Lindalou Hose, PT DPT Acute Rehabilitation Services Office [phone]   Leonie Man 02/08/2023, 2:59 PM

## 2023-02-08 NOTE — Plan of Care (Signed)
  Problem: Education: Goal: Expressions of having a comfortable level of knowledge regarding the disease process will increase Outcome: Progressing   Problem: Coping: Goal: Ability to adjust to condition or change in health will improve Outcome: Progressing Goal: Ability to identify appropriate support needs will improve Outcome: Progressing   Problem: Health Behavior/Discharge Planning: Goal: Compliance with prescribed medication regimen will improve Outcome: Progressing   Problem: Medication: Goal: Risk for medication side effects will decrease Outcome: Progressing   Problem: Clinical Measurements: Goal: Complications related to the disease process, condition or treatment will be avoided or minimized Outcome: Progressing Goal: Diagnostic test results will improve Outcome: Progressing   Problem: Safety: Goal: Verbalization of understanding the information provided will improve Outcome: Progressing   Problem: Self-Concept: Goal: Level of anxiety will decrease Outcome: Progressing Goal: Ability to verbalize feelings about condition will improve Outcome: Progressing   Problem: Education: Goal: Knowledge of General Education information will improve Description: Including pain rating scale, medication(s)/side effects and non-pharmacologic comfort measures Outcome: Progressing   Problem: Health Behavior/Discharge Planning: Goal: Ability to manage health-related needs will improve Outcome: Progressing   Problem: Clinical Measurements: Goal: Ability to maintain clinical measurements within normal limits will improve Outcome: Progressing Goal: Will remain free from infection Outcome: Progressing Goal: Diagnostic test results will improve Outcome: Progressing Goal: Respiratory complications will improve Outcome: Progressing Goal: Cardiovascular complication will be avoided Outcome: Progressing   Problem: Activity: Goal: Risk for activity intolerance will  decrease Outcome: Progressing   Problem: Nutrition: Goal: Adequate nutrition will be maintained Outcome: Progressing   Problem: Coping: Goal: Level of anxiety will decrease Outcome: Progressing   Problem: Elimination: Goal: Will not experience complications related to bowel motility Outcome: Progressing Goal: Will not experience complications related to urinary retention Outcome: Progressing   Problem: Pain Managment: Goal: General experience of comfort will improve Outcome: Progressing   Problem: Safety: Goal: Ability to remain free from injury will improve Outcome: Progressing   Problem: Skin Integrity: Goal: Risk for impaired skin integrity will decrease Outcome: Progressing   Problem: Safety: Goal: Non-violent Restraint(s) Outcome: Progressing   

## 2023-02-08 NOTE — Care Management Important Message (Signed)
Important Message  Patient Details  Name: Selena Branch MRN: [id number] Date of Birth: 07-Sep-1941   Medicare Important Message Given:  Yes     Dorena Bodo 02/08/2023, 3:47 PM

## 2023-02-08 NOTE — Progress Notes (Signed)
Speech Language Pathology Treatment: Dysphagia;Cognitive-Linquistic  Patient Details Name: Selena Branch MRN: [id number] DOB: 06/21/1941 Today's Date: 02/08/2023 Time: 4098-1191 SLP Time Calculation (min) (ACUTE ONLY): 15 min  Assessment / Plan / Recommendation Clinical Impression  Patient seen by SLP for skilled treatment session focused on cognition and briefly on dysphagia goals. Patient was awake and alert eyes mainly closed but only oriented to self and perseverative with asking for restraints to come off. She was only able to accurately respond to questions: what is your name? And where do you live? but for all other questions, her responses were not related. She was able to follow basic directions when SLP helping her scoot up in bed as well as when SLP feeding her some gelatin. She required frequent tactile and verbal redirection cues. No overt s/s aspiration with gelatin, however no other PO's attempted as patient unable to adequately attend. SLP recommends to continue Full liquids diet and will follow patient for swallow and cognitive-linguistic function.    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  Head CT and brain MRI were negative for acute abnormalities.  She has no previous history of seizures, no family history of seizures, no history of brain surgery, no history of meningitis or encephalitis, and history of 1 fall in which she hit her head and had to have stitches but no other serious head injuries.  At baseline, she is alert and oriented, able to do all of her ADLs and cares for her husband.      SLP Plan  Continue with current plan of care      Recommendations for follow up therapy are one component of a multi-disciplinary discharge planning process, led by the attending physician.  Recommendations may be updated based on patient  status, additional functional criteria and insurance authorization.    Recommendations  Diet recommendations: Thin liquid Liquids provided via: Cup;Straw Medication Administration: Via alternative means Supervision: Staff to assist with self feeding;Full supervision/cueing for compensatory strategies Compensations: Slow rate;Small sips/bites Postural Changes and/or Swallow Maneuvers: Seated upright 90 degrees                  Oral care QID;Staff/trained caregiver to provide oral care   Frequent or constant Supervision/Assistance Cognitive communication deficit (R41.841);Dysphagia, unspecified (R13.10)     Continue with current plan of care     Angela Nevin, MA, CCC-SLP Speech Therapy

## 2023-02-08 NOTE — Progress Notes (Incomplete)
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  Head CT and brain MRI were negative for acute abnormalities.  She has no previous history of seizures, no family history of seizures, no history of brain surgery, no history of meningitis or encephalitis. At baseline, she is alert and oriented, able to do all of her ADLs and cares for her husband.   S:// Patient awake and interactive. Currently on LTM, read this AM shows left lateralized periodic discharges with moderate diffuse encephalopathy.  Patient states her name, birth date correctly. Disoriented to place, time, age. She answers with nonsensical responses to most questions concerning current state, and answers appropriately when asked about the past. Intermittently follows one-step commands.  Family at bedside; updated.    O:// Current vital signs: BP (!) 146/72 (BP Location: Right Arm)   Pulse 73   Temp (!) 97.4 F (36.3 C) (Oral)   Resp 15   Wt 49.2 kg   SpO2 99%   BMI 22.67 kg/m  Vital signs in last 24 hours: Temp:  [97.4 F (36.3 C)-98.5 F (36.9 C)] 97.4 F (36.3 C) (05/28 0854) Pulse Rate:  [61-79] 73 (05/28 0854) Resp:  [15-19] 15 (05/28 0854) BP: (127-164)/(57-75) 146/72 (05/28 0854) SpO2:  [96 %-100 %] 99 % (05/28 0854)  GENERAL: elderly female in NAD.  HEENT: - Normocephalic and atraumatic, dry mm LUNGS - Clear to auscultation bilaterally with no wheezes CV - S1S2 RRR, no m/r/g, equal pulses bilaterally. ABDOMEN - Soft, nontender, nondistended with normoactive BS Ext: warm, well perfused, intact peripheral pulses, no edema  NEURO:  Mental Status: lying comfortably in bed.  She is oriented to self, birthdate, age. Unable to answer other orientation questions correctly, some nonsensical answers She is able to intermittently follow very simple one step  commands  Speech is clear. Not able to name objects or repeat. Poor attention Motor: moving all 4 extremities spontaneously  Tone: is normal and bulk is normal Sensation- withdraws in all 4 extremities  Coordination: unable to assess due to AMS Gait- deferred   Medications  Current Facility-Administered Medications:    acetaminophen (TYLENOL) tablet 650 mg, 650 mg, Oral, Q4H PRN **OR** acetaminophen (TYLENOL) suppository 650 mg, 650 mg, Rectal, Q4H PRN, Rodolph Bong, MD   amLODipine (NORVASC) tablet 10 mg, 10 mg, Oral, QHS, Leroy Sea, MD, 10 mg at 02/07/23 2121   atorvastatin (LIPITOR) tablet 40 mg, 40 mg, Oral, Daily, Susa Raring K, MD, 40 mg at 02/08/23 9147   carvedilol (COREG) tablet 3.125 mg, 3.125 mg, Oral, BID WC, Susa Raring K, MD, 3.125 mg at 02/08/23 8295   cefTRIAXone (ROCEPHIN) 1 g in sodium chloride 0.9 % 100 mL IVPB, 1 g, Intravenous, Q24H, Leroy Sea, MD, Stopped at 02/08/23 0641   chlorhexidine gluconate (MEDLINE KIT) (PERIDEX) 0.12 % solution 15 mL, 15 mL, Mouth Rinse, BID, Rodolph Bong, MD, 15 mL at 02/05/23 0800   Chlorhexidine Gluconate Cloth 2 % PADS 6 each, 6 each, Topical, Daily, Leroy Sea, MD, 6 each at 02/07/23 0910   dorzolamide-timolol (COSOPT) 2-0.5 % ophthalmic solution 1 drop, 1 drop, Both Eyes, Daily, Rodolph Bong, MD, 1 drop at 02/07/23 0910   [START ON 02/09/2023] eltrombopag (PROMACTA) tablet 25 mg, 25 mg, Oral, Once per day on Mon Wed Fri, Singh, Prashant K, MD   heparin injection 5,000 Units,  5,000 Units, Subcutaneous, Q8H, Leroy Sea, MD, 5,000 Units at 02/08/23 1610   hydrALAZINE (APRESOLINE) injection 5 mg, 5 mg, Intravenous, Q6H PRN, Rodolph Bong, MD   lacosamide (VIMPAT) 200 mg in sodium chloride 0.9 % 25 mL IVPB, 200 mg, Intravenous, Q12H, Milon Dikes, MD, Stopped at 02/07/23 2222   latanoprost (XALATAN) 0.005 % ophthalmic solution 1 drop, 1 drop, Both Eyes, QHS, Rodolph Bong, MD, 1  drop at 02/07/23 2124   levETIRAcetam (KEPPRA) IVPB 1500 mg/ 100 mL premix, 1,500 mg, Intravenous, Q12H, Milon Dikes, MD, Stopped at 02/07/23 2102   LORazepam (ATIVAN) injection 4 mg, 4 mg, Intravenous, Q5 Min x 2 PRN, Rodolph Bong, MD, 4 mg at 02/04/23 2018   ondansetron (ZOFRAN) tablet 4 mg, 4 mg, Oral, Q6H PRN **OR** ondansetron (ZOFRAN) injection 4 mg, 4 mg, Intravenous, Q6H PRN, Rodolph Bong, MD   Oral care mouth rinse, 15 mL, Mouth Rinse, 10 times per day, Rodolph Bong, MD, 15 mL at 02/07/23 2126   pantoprazole (PROTONIX) injection 40 mg, 40 mg, Intravenous, QHS, Rodolph Bong, MD, 40 mg at 02/07/23 2127   polyethylene glycol (MIRALAX / GLYCOLAX) packet 17 g, 17 g, Oral, Daily PRN, Rodolph Bong, MD   potassium chloride (KLOR-CON) packet 40 mEq, 40 mEq, Oral, Once, Leroy Sea, MD   sertraline (ZOLOFT) tablet 25 mg, 25 mg, Oral, Daily, Susa Raring K, MD, 25 mg at 02/08/23 9604   sodium chloride flush (NS) 0.9 % injection 3 mL, 3 mL, Intravenous, Q12H, Rodolph Bong, MD, 3 mL at 02/07/23 2127   sorbitol 70 % solution 30 mL, 30 mL, Oral, Daily PRN, Rodolph Bong, MD   tamsulosin [facility]) capsule 0.4 mg, 0.4 mg, Oral, Daily, Susa Raring K, MD, 0.4 mg at 02/08/23 0833  Labs CBC    Component Value Date/Time   WBC 8.2 02/08/2023 0213   RBC 4.59 02/08/2023 0213   HGB 14.1 02/08/2023 0213   HGB 14.3 02/01/2023 0923   HCT 40.9 02/08/2023 0213   PLT 106 (L) 02/08/2023 0213   PLT 109 (L) 02/01/2023 0923   MCV 89.1 02/08/2023 0213   MCH 30.7 02/08/2023 0213   MCHC 34.5 02/08/2023 0213   RDW 12.7 02/08/2023 0213   LYMPHSABS 2.8 02/08/2023 0213   MONOABS 0.7 02/08/2023 0213   EOSABS 0.3 02/08/2023 0213   BASOSABS 0.0 02/08/2023 0213    CMP     Component Value Date/Time   NA 138 02/08/2023 0213   K 3.7 02/08/2023 0213   CL 108 02/08/2023 0213   CO2 21 (L) 02/08/2023 0213   GLUCOSE 114 (H) 02/08/2023 0213   BUN <5 (L) 02/08/2023  0213   CREATININE 0.61 02/08/2023 0213   CREATININE 0.81 02/01/2023 0923   CALCIUM 8.2 (L) 02/08/2023 0213   PROT 6.3 (L) 02/05/2023 0310   ALBUMIN 4.0 02/05/2023 0310   AST 30 02/05/2023 0310   AST 21 02/01/2023 0923   ALT 22 02/05/2023 0310   ALT 9 02/01/2023 0923   ALKPHOS 42 02/05/2023 0310   BILITOT 0.9 02/05/2023 0310   BILITOT 0.5 02/01/2023 0923   GFRNONAA >60 02/08/2023 0213   GFRNONAA >60 02/01/2023 0923   GFRAA >60 06/06/2020 1000     Lipid Panel     Component Value Date/Time   CHOL 139 02/05/2023 0621   TRIG 31 02/05/2023 0621   HDL 64 02/05/2023 0621   CHOLHDL 2.2 02/05/2023 0621   VLDL 6 02/05/2023 0621   LDLCALC  69 02/05/2023 0621     Imaging I have reviewed images in epic and the results pertinent to this consultation are:  CT-scan of the brain- No acute abnormalities   CTA head and neck: No LVO or hemodynamically significant stenosis   MRI examination of the brain-No acute infarct, no acute abnormalities   EEG 5/28: Lateralized periodic discharges, left hemisphere, Continuous slow, generalized and lateralized left hemisphere. Appears to be improving compared to previous day.   EEG 5/27: Lateralized periodic discharges, left hemisphere, Continuous slow, generalized and lateralized left hemisphere   EEG 5/26: 9 seizures without clinical signs arising from left hemisphere overnight, lasting about 10 seconds each, LPD's in left hemisphere   EEG 5/25: Focal electrographic status epilepticus in left hemisphere, maximal left parietal region which terminated when patient was loaded with Keppra, LPD's left hemisphere   Assessment: 82 year old patient with history of CLL, ITP and ICH presented with aphasia and generalized weakness.  Head CT and MRI were negative for acute abnormalities, but EEG revealed the patient to be in electrographic status epilepticus, which terminated after Keppra load.   - More specifically, EEG showed ongoing seizure activity and LPD's  arising from left hemisphere, parietal lobe; this is the area where her previous ICH was.   - Patient is now on Keppra 1500 mg twice daily, and Vimpat 200 mg twice daily  - LTM EEG report for today: Lateralized periodic discharges, left hemisphere. Continuous slow, generalized and lateralized left hemisphere.  This study showed evidence of epileptogenicity and  cortical dysfunction arising from left hemisphere likely secondary to underlying structural abnormality. Additionally there is moderate diffuse encephalopathy. No definite seizures were seen. EEG appears to be improving compared to previous day.  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 statutes when mental status is improved: Per [employer] statutes, patients with seizures are not allowed to drive until they have been seizure-free for six months. Use caution when using heavy equipment or power tools. Avoid working on ladders or at heights. Take showers instead of baths. Ensure the water temperature is not too high on the home water heater. Do not go swimming alone. Do not lock yourself in a room alone (i.e. bathroom). When caring for infants or small children, sit down when holding, feeding, or changing them to minimize risk of injury to the child in the event you have a seizure. Maintain good sleep hygiene. Avoid alcohol.    Lynnae January, DNP, AGACNP-BC [facility]  Electronically signed: Dr. Caryl Pina

## 2023-02-08 NOTE — [place] FL2 (Signed)
MEDICAID FL2 LEVEL OF CARE FORM     IDENTIFICATION  Patient Name: Selena Branch Birthdate: [birth year] Sex: female Admission Date (Current Location): 02/04/2023  [facility] and [place] Number:  Producer, television/film/video and Address:  [facility] [place], [address] [place], [place] [address]      Provider Number: [id number]  Attending Physician Name and Address:  Leroy Sea, MD  Relative Name and Phone Number:       Current Level of Care: Hospital Recommended Level of Care: Skilled Nursing Facility Prior Approval Number:    Date Approved/Denied:   PASRR Number: [id number]  Discharge Plan: SNF    Current Diagnoses: Patient Active Problem List   Diagnosis Date Noted   Aphasia 02/04/2023   Seizure [facility]): Unwitnessed 02/04/2023   Adnexal mass 01/09/2021   Encounter for immunotherapy    Intracerebral hemorrhage 09/17/2019   Intracranial bleed (HCC) 09/16/2019   Encephalopathy acute    Essential hypertension 09/15/2019   Dyslipidemia 09/15/2019   Acute ITP (HCC)    Thrombocytopenia (HCC) 08/30/2019   Anemia    CLL (chronic lymphocytic leukemia) (HCC)    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    Orientation RESPIRATION BLADDER Height & Weight     Self  O2 (1L nasal cannula) Incontinent, Indwelling catheter Weight: 108 lb 7.5 oz (49.2 kg) Height:     BEHAVIORAL SYMPTOMS/MOOD NEUROLOGICAL BOWEL NUTRITION STATUS      Continent Diet (See dc summary)  AMBULATORY STATUS COMMUNICATION OF NEEDS Skin   Extensive Assist Verbally Normal                       Personal Care Assistance Level of Assistance  Bathing, Feeding, Dressing Bathing Assistance: Maximum assistance Feeding assistance: Limited assistance Dressing Assistance: Limited assistance     Functional Limitations Info             SPECIAL CARE FACTORS FREQUENCY  PT (By licensed PT), OT (By licensed OT)     PT Frequency: 5x/week OT Frequency: 5x/week             Contractures Contractures Info: Not present    Additional Factors Info  Code Status, Allergies, Psychotropic Code Status Info: DNR Allergies Info: Codeine, Evista (Raloxifene), Actonel (Risedronate), Boniva (Ibandronic Acid), Fosamax (Alendronate), Prednisone Psychotropic Info: Zoloft         Current Medications (02/08/2023):  This is the current hospital active medication list Current Facility-Administered Medications  Medication Dose Route Frequency Provider Last Rate Last Admin   acetaminophen (TYLENOL) tablet 650 mg  650 mg Oral Q4H PRN Rodolph Bong, MD       Or   acetaminophen (TYLENOL) suppository 650 mg  650 mg Rectal Q4H PRN Rodolph Bong, MD       amLODipine (NORVASC) tablet 10 mg  10 mg Oral QHS Leroy Sea, MD   10 mg at 02/07/23 2121   atorvastatin (LIPITOR) tablet 40 mg  40 mg Oral Daily Leroy Sea, MD   40 mg at 02/08/23 0833   carvedilol (COREG) tablet 3.125 mg  3.125 mg Oral BID WC Leroy Sea, MD   3.125 mg at 02/08/23 0833   cefTRIAXone (ROCEPHIN) 1 g in sodium chloride 0.9 % 100 mL IVPB  1 g Intravenous Q24H Leroy Sea, MD   Stopped at 02/08/23 [phone]   chlorhexidine gluconate (MEDLINE KIT) (PERIDEX) 0.12 % solution 15 mL  15 mL Mouth Rinse BID Janee Morn,  Lovey Newcomer, MD   15 mL at 02/08/23 0800   Chlorhexidine Gluconate Cloth 2 % PADS 6 each  6 each Topical Daily Leroy Sea, MD   6 each at 02/08/23 1022   dorzolamide-timolol (COSOPT) 2-0.5 % ophthalmic solution 1 drop  1 drop Both Eyes Daily Rodolph Bong, MD   1 drop at 02/08/23 1022   [START ON 02/09/2023] eltrombopag (PROMACTA) tablet 25 mg  25 mg Oral Once per day on Mon Wed Thu Sat Leroy Sea, MD       heparin injection 5,000 Units  5,000 Units Subcutaneous Q8H Leroy Sea, MD   5,000 Units at 02/08/23 8295   hydrALAZINE (APRESOLINE) injection 5 mg  5 mg Intravenous Q6H PRN Rodolph Bong, MD       lacosamide (VIMPAT) 200 mg in sodium  chloride 0.9 % 25 mL IVPB  200 mg Intravenous Q12H Milon Dikes, MD 90 mL/hr at 02/08/23 1049 200 mg at 02/08/23 1049   latanoprost (XALATAN) 0.005 % ophthalmic solution 1 drop  1 drop Both Eyes QHS Rodolph Bong, MD   1 drop at 02/07/23 2124   levETIRAcetam (KEPPRA) IVPB 1500 mg/ 100 mL premix  1,500 mg Intravenous Q12H Milon Dikes, MD 400 mL/hr at 02/08/23 1020 1,500 mg at 02/08/23 1020   LORazepam (ATIVAN) injection 4 mg  4 mg Intravenous Q5 Min x 2 PRN Rodolph Bong, MD   4 mg at 02/04/23 2018   ondansetron (ZOFRAN) tablet 4 mg  4 mg Oral Q6H PRN Rodolph Bong, MD       Or   ondansetron [facility]) injection 4 mg  4 mg Intravenous Q6H PRN Rodolph Bong, MD       Oral care mouth rinse  15 mL Mouth Rinse 10 times per day Rodolph Bong, MD   15 mL at 02/08/23 1357   pantoprazole (PROTONIX) injection 40 mg  40 mg Intravenous QHS Rodolph Bong, MD   40 mg at 02/07/23 2127   polyethylene glycol (MIRALAX / GLYCOLAX) packet 17 g  17 g Oral Daily PRN Rodolph Bong, MD       potassium chloride (KLOR-CON) packet 40 mEq  40 mEq Oral Once Leroy Sea, MD       sertraline (ZOLOFT) tablet 25 mg  25 mg Oral Daily Susa Raring K, MD   25 mg at 02/08/23 0833   sodium chloride flush (NS) 0.9 % injection 3 mL  3 mL Intravenous Q12H Rodolph Bong, MD   3 mL at 02/08/23 1022   sorbitol 70 % solution 30 mL  30 mL Oral Daily PRN Rodolph Bong, MD       tamsulosin [facility]) capsule 0.4 mg  0.4 mg Oral Daily Leroy Sea, MD   0.4 mg at 02/08/23 6213     Discharge Medications: Please see discharge summary for a list of discharge medications.  Relevant Imaging Results:  Relevant Lab Results:   Additional Information SSN: [id number] [place], [place]

## 2023-02-09 DIAGNOSIS — R569 Unspecified convulsions: Secondary | ICD-10-CM | POA: Diagnosis not present

## 2023-02-09 LAB — BRAIN NATRIURETIC PEPTIDE: B Natriuretic Peptide: 20.9 pg/mL (ref 0.0–100.0)

## 2023-02-09 LAB — CBC WITH DIFFERENTIAL/PLATELET
Abs Immature Granulocytes: 0.02 10*3/uL (ref 0.00–0.07)
Basophils Absolute: 0 10*3/uL (ref 0.0–0.1)
Basophils Relative: 0 %
Eosinophils Absolute: 0.3 10*3/uL (ref 0.0–0.5)
Eosinophils Relative: 4 %
HCT: 40 % (ref 36.0–46.0)
Hemoglobin: 13.7 g/dL (ref 12.0–15.0)
Immature Granulocytes: 0 %
Lymphocytes Relative: 37 %
Lymphs Abs: 2.7 10*3/uL (ref 0.7–4.0)
MCH: 31.2 pg (ref 26.0–34.0)
MCHC: 34.3 g/dL (ref 30.0–36.0)
MCV: 91.1 fL (ref 80.0–100.0)
Monocytes Absolute: 0.6 10*3/uL (ref 0.1–1.0)
Monocytes Relative: 8 %
Neutro Abs: 3.6 10*3/uL (ref 1.7–7.7)
Neutrophils Relative %: 51 %
Platelets: 99 10*3/uL — ABNORMAL LOW (ref 150–400)
RBC: 4.39 MIL/uL (ref 3.87–5.11)
RDW: 12.8 % (ref 11.5–15.5)
WBC: 7.2 10*3/uL (ref 4.0–10.5)
nRBC: 0 % (ref 0.0–0.2)

## 2023-02-09 LAB — BASIC METABOLIC PANEL
Anion gap: 11 (ref 5–15)
BUN: 6 mg/dL — ABNORMAL LOW (ref 8–23)
CO2: 23 mmol/L (ref 22–32)
Calcium: 8.5 mg/dL — ABNORMAL LOW (ref 8.9–10.3)
Chloride: 105 mmol/L (ref 98–111)
Creatinine, Ser: 0.68 mg/dL (ref 0.44–1.00)
GFR, Estimated: >60 mL/min (ref >60)
Glucose, Bld: 101 mg/dL — ABNORMAL HIGH (ref 70–99)
Potassium: 3.7 mmol/L (ref 3.5–5.1)
Sodium: 139 mmol/L (ref 135–145)

## 2023-02-09 LAB — MAGNESIUM: Magnesium: 2 mg/dL (ref 1.7–2.4)

## 2023-02-09 NOTE — Progress Notes (Signed)
Physical Therapy Treatment Patient Details Name: Selena Branch MRN: [id number] DOB: 1940/09/26 Today's Date: 02/09/2023   History of Present Illness Selena Branch is a 82 y.o. female admitted to [facility] on 5/24 with sudden onset altered mental status/aphasia. Selena Branch; EEG: positive for seizures. PMHx: CLL currently in remission, glaucoma, hypertension, thrombocytopenia, hyperlipidemia Selena Branch; EEG: positive for seizures.    PT Comments    Pt greeted supine in bed, perseverating on restraints and the feeling of just "lifting 250 pounds" however agreeable to session with continued progress towards acute goals. Pt able to come to sitting with mod A this session as pt demonstrating decreased initiation and need for multimodal cues and truncal support to complete. Pt able to rise to stand with min A and HHA x1 and maintain with light min A to steady. Pt with improved ability to follow all commands this session and able to side step toward [facility] and complete pre-gait activities standing at EOB. Unable to progress gait away from EOB due to continuous EEG. Pt continues to benefit from skilled PT services to progress toward functional mobility goals.     Recommendations for follow up therapy are one component of a multi-disciplinary discharge planning process, led by the attending physician.  Recommendations may be updated based on patient status, additional functional criteria and insurance authorization.  Follow Up Recommendations  Can patient physically be transported by private vehicle: No    Assistance Recommended at Discharge Frequent or constant Supervision/Assistance  Patient can return home with the following A lot of help with walking and/or transfers;A lot of help with bathing/dressing/bathroom;Direct supervision/assist for medications management;Direct supervision/assist for financial management;Assistance with cooking/housework;Assist for transportation;Help with stairs or ramp for  entrance   Equipment Recommendations  Other (comment) (defer to next level of care)    Recommendations for Other Services       Precautions / Restrictions Precautions Precautions: Fall;Other (comment) Precaution Comments: seizure Restrictions Weight Bearing Restrictions: No     Mobility  Bed Mobility Overal bed mobility: Needs Assistance Bed Mobility: Supine to Sit, Sit to Supine     Supine to sit: HOB elevated, Mod assist Sit to supine: Mod assist   General bed mobility comments: mod A provided for safety with balance and trunk support along with line management with increased assist needed this session for initiation. modA provided to return to supine for BLE management and trunk support    Transfers Overall transfer level: Needs assistance Equipment used: 1 person hand held assist Transfers: Sit to/from Stand Sit to Stand: Min assist           General transfer comment: Pt standing at EOB with minA pt able to march in place and side step tp HOB with good command following    Ambulation/Gait               General Gait Details: limited with continuous EEG   Stairs             Wheelchair Mobility    Modified Rankin (Stroke Patients Only)       Balance Overall balance assessment: Needs assistance Sitting-balance support: Bilateral upper extremity supported, Feet supported Sitting balance-Leahy Scale: Fair Sitting balance - Comments: minG to minA for balance at EOB due to safety concerns   Standing balance support: Single extremity supported Standing balance-Leahy Scale: Fair Standing balance comment: minG to minA provided for safety and balance with intermittent 1UE support  Cognition Arousal/Alertness: Awake/alert Behavior During Therapy: Flat affect Overall Cognitive Status: Impaired/Different from baseline Area of Impairment: Orientation, Memory, Following commands, Safety/judgement, Problem  solving                 Orientation Level: Disoriented to, Place, Time, Situation   Memory: Decreased recall of precautions, Decreased short-term memory Following Commands: Follows one step commands inconsistently, Follows one step commands with increased time Safety/Judgement: Decreased awareness of safety, Decreased awareness of deficits   Problem Solving: Slow processing, Difficulty sequencing, Requires verbal cues, Requires tactile cues General Comments: Pt able to state her name and her birthday, pt able to state she is in [place] but unable to identify hospital        Exercises Other Exercises Other Exercises: standing marching x20    General Comments General comments (skin integrity, edema, etc.): Pt reports feeling sick at the end of the session, VSS on RA      Pertinent Vitals/Pain Pain Assessment Pain Assessment: Faces Faces Pain Scale: Hurts a little bit Pain Location: uncomfortable with restraints Pain Descriptors / Indicators: Grimacing, Discomfort Pain Intervention(s): Monitored during session, Limited activity within patient's tolerance, Repositioned    Home Living                          Prior Function            PT Goals (current goals can now be found in the care plan section) Acute Rehab PT Goals PT Goal Formulation: Patient unable to participate in goal setting Time For Goal Achievement: 02/22/23 Progress towards PT goals: Progressing toward goals    Frequency    Min 3X/week      PT Plan      Co-evaluation              AM-PAC PT "6 Clicks" Mobility   Outcome Measure  Help needed turning from your back to your side while in a flat bed without using bedrails?: A Lot Help needed moving from lying on your back to sitting on the side of a flat bed without using bedrails?: A Lot Help needed moving to and from a bed to a chair (including a wheelchair)?: A Lot Help needed standing up from a chair using your arms (e.g.,  wheelchair or bedside chair)?: A Little Help needed to walk in hospital room?: A Lot Help needed climbing 3-5 steps with a railing? : Total 6 Click Score: 12    End of Session   Activity Tolerance: Patient tolerated treatment well Patient left: in bed;with call bell/phone within reach;with bed alarm set;with restraints reapplied Nurse Communication: Mobility status PT Visit Diagnosis: Muscle weakness (generalized) (M62.81);Other abnormalities of gait and mobility (R26.89);Unsteadiness on feet (R26.81)     Time: 6295-2841 PT Time Calculation (min) (ACUTE ONLY): 15 min  Charges:  $Therapeutic Activity: 8-22 mins                     Lissandro Dilorenzo R. PTA Acute Rehabilitation Services Office: [phone]   Catalina Antigua 02/09/2023, 3:28 PM

## 2023-02-09 NOTE — Progress Notes (Signed)
PROGRESS NOTE                                                                                                                                                                                                             Patient Demographics:    Selena Branch, is a 82 y.o. female, DOB - 1941-01-01, WUJ:[id number]  Outpatient Primary MD for the patient is Gweneth Dimitri, MD    LOS - 4  Admit date - 02/04/2023    Chief Complaint  Patient presents with   Code Stroke       Brief Narrative (HPI from H&P)     82 y.o. female with medical history significant of CLL currently in remission, glaucoma, hypertension, thrombocytopenia, hyperlipidemia presenting to the ED with sudden onset altered mental status/aphasia, this happened while she was on the phone at home, she was doing fine but suddenly became unresponsive and confused.  She was brought to the ER where she was seen by neurology and admitted for evaluation of expressive aphasia and possible stroke workup.   Subjective:   Patient in bed, still confused, she denies headache, abdominal pain or chest pain, she is eating ice cream and pudding once assisted by staff    Assessment  & Plan :   Metabolic encephalopathy, expressive aphasia.  Due to status epilepticus, MRI brain, CTA head and neck unremarkable, on Keppra & Vimpat, case discussed with neurology 02/07/23, placed on IV fluids, PT, OT speech eval.  -Management per neurology, she is on LTM EEG, she is currently on increased dose Keppra and Vimpat -Seizure activity arising from left hemisphere, parietal lobe, this is the area of her previous ICH .   2.  Glaucoma   - resume home eyedrops.   3.  Hypertension  -pressure remains elevated, continue with Coreg, amlodipine, as needed hydralazine as well    4.  Hyperlipidemia  - on statin    5.  CLL/thrombocytopenia  - Stable.  Outpatient follow-up with oncology.   6.  History of  intracerebral hemorrhage  - Stable.  7.  Urinary retention with possible UTI.  Foley placed, Flomax, 3 days of Rocephin.  8.  Hypokalemia.  Replaced.      Condition - Extremely Guarded  Family Communication  : none at bedside, D/W daughter by phone  Code Status :  DNR  Consults  :  Neuro  PUD Prophylaxis : PPI   Procedures  :     MRI brain, CTA head and neck.  No stroke, no significant occlusion in any vessel.  EEG.  Positive for possible status epilepticus.      Disposition Plan  :    Status is: Inpatient  DVT Prophylaxis  :    heparin injection 5,000 Units Start: 02/05/23 1400 SCDs Start: 02/04/23 1729    Lab Results  Component Value Date   PLT 99 (L) 02/09/2023    Diet :  Diet Order             Diet full liquid Room service appropriate? No; Fluid consistency: Thin  Diet effective now                    Inpatient Medications  Scheduled Meds:  amLODipine  10 mg Oral QHS   atorvastatin  40 mg Oral Daily   carvedilol  3.125 mg Oral BID WC   chlorhexidine gluconate (MEDLINE KIT)  15 mL Mouth Rinse BID   Chlorhexidine Gluconate Cloth  6 each Topical Daily   dorzolamide-timolol  1 drop Both Eyes Daily   eltrombopag  25 mg Oral Once per day on Mon Wed Thu Sat   heparin injection (subcutaneous)  5,000 Units Subcutaneous Q8H   latanoprost  1 drop Both Eyes QHS   mouth rinse  15 mL Mouth Rinse 10 times per day   pantoprazole (PROTONIX) IV  40 mg Intravenous QHS   potassium chloride  40 mEq Oral Once   sertraline  25 mg Oral Daily   sodium chloride flush  3 mL Intravenous Q12H   tamsulosin  0.4 mg Oral Daily   Continuous Infusions:  lacosamide (VIMPAT) IV 200 mg (02/09/23 0957)   levETIRAcetam 1,500 mg (02/09/23 0913)   PRN Meds:.acetaminophen **OR** acetaminophen, hydrALAZINE, LORazepam, ondansetron **OR** ondansetron (ZOFRAN) IV, polyethylene glycol, sorbitol  Antibiotics  :    Anti-infectives (From admission, onward)    Start     Dose/Rate  Route Frequency Ordered Stop   02/08/23 0600  cefTRIAXone (ROCEPHIN) 1 g in sodium chloride 0.9 % 100 mL IVPB        1 g 200 mL/hr over 30 Minutes Intravenous Every 24 hours 02/07/23 0600 02/09/23 0547   02/07/23 0700  cefTRIAXone (ROCEPHIN) 1 g in sodium chloride 0.9 % 100 mL IVPB        1 g 200 mL/hr over 30 Minutes Intravenous  Once 02/07/23 0601 02/07/23 0645         Objective:   Vitals:   02/09/23 0400 02/09/23 0722 02/09/23 0730 02/09/23 1154  BP: (!) 145/63 (!) 152/67  (!) 164/78  Pulse: 73 76 77   Resp: 20 19 (!) 23   Temp:  97.6 F (36.4 C)  97.9 F (36.6 C)  TempSrc:  Oral  Oral  SpO2: 99% 99%    Weight:        Wt Readings from Last 3 Encounters:  02/04/23 49.2 kg  10/29/22 48.3 kg  07/30/22 48.8 kg     Intake/Output Summary (Last 24 hours) at 02/09/2023 1158 Last data filed at 02/09/2023 0600 Gross per 24 hour  Intake 393.27 ml  Output 650 ml  Net -256.73 ml     Physical Exam  Awake, answering her name, and she keeps repeating her name has answered all questions, she is having some lipsmacking as well, unable to answer any other questions.,  Deconditioned Symmetrical Chest wall movement, Good air  movement bilaterally, CTAB RRR,No Gallops,Rubs or new Murmurs, No Parasternal Heave +ve B.Sounds, Abd Soft, No tenderness, No rebound - guarding or rigidity. No Cyanosis, Clubbing or edema, No new Rash or bruise       Data Review:    Recent Labs  Lab 02/05/23 0310 02/06/23 0400 02/07/23 0342 02/08/23 0213 02/09/23 0419  WBC 9.1 8.8 8.1 8.2 7.2  HGB 14.2 12.7 13.2 14.1 13.7  HCT 41.2 37.7 38.4 40.9 40.0  PLT 90* 101* 94* 106* 99*  MCV 92.4 92.0 92.1 89.1 91.1  MCH 31.8 31.0 31.7 30.7 31.2  MCHC 34.5 33.7 34.4 34.5 34.3  RDW 12.8 12.9 12.5 12.7 12.8  LYMPHSABS 1.5 3.1 2.1 2.8 2.7  MONOABS 0.5 0.7 0.7 0.7 0.6  EOSABS 0.0 0.1 0.2 0.3 0.3  BASOSABS 0.0 0.0 0.0 0.0 0.0    Recent Labs  Lab 02/04/23 1501 02/04/23 1508 02/05/23 0310  02/05/23 0621 02/06/23 0400 02/07/23 0342 02/08/23 0213 02/09/23 0419  NA 139   < > 139  --  137 139 138 139  K 3.5   < > 3.6  --  3.3* 2.9* 3.7 3.7  CL 104   < > 106  --  106 108 108 105  CO2 26  --  22  --  24 23 21* 23  ANIONGAP 9  --  11  --  7 8 9 11   GLUCOSE 155*   < > 124*  --  111* 119* 114* 101*  BUN 15   < > 14  --  9 5* <5* 6*  CREATININE 0.99   < > 0.76  --  0.66 0.60 0.61 0.68  AST 28  --  30  --   --   --   --   --   ALT 14  --  22  --   --   --   --   --   ALKPHOS 53  --  42  --   --   --   --   --   BILITOT 0.5  --  0.9  --   --   --   --   --   ALBUMIN 4.2  --  4.0  --   --   --   --   --   INR 1.0  --   --   --   --   --   --   --   TSH  --   --   --  0.618  --   --   --   --   HGBA1C  --   --   --  5.6  --   --   --   --   BNP  --   --   --   --  286.2* 154.1* 89.9 20.9  MG  --   --  1.9  --  2.1 1.9 2.0 2.0  CALCIUM 9.5  --  8.6*  --  7.8* 7.7* 8.2* 8.5*   < > = values in this interval not displayed.     Lab Results  Component Value Date   CHOL 139 02/05/2023   HDL 64 02/05/2023   LDLCALC 69 02/05/2023   TRIG 31 02/05/2023   CHOLHDL 2.2 02/05/2023    Lab Results  Component Value Date   HGBA1C 5.6 02/05/2023      Radiology Reports No results found.    Signature  -   Huey Bienenstock M.D on 02/09/2023 at 11:58 AM   -  To page go to [email or web address]

## 2023-02-09 NOTE — Plan of Care (Signed)

## 2023-02-09 NOTE — Procedures (Signed)
Patient Name: Selena Branch  MRN: [id number]  Epilepsy Attending: Charlsie Quest  Referring Physician/Provider: Marjorie Smolder, NP  Duration: 02/08/2023 1629 02/09/2023 1629, 2129 to 2309   Patient history: 82 y.o. female with a past medical history of CLL, ITP, left parietal ICH in 2021 presenting with aphasia and generalized weakness. EEG to evaluate for seizure.   Level of alertness: awake. asleep   AEDs during EEG study: LEV, LCM   Technical aspects: This EEG study was done with scalp electrodes positioned according to the 10-20 International system of electrode placement. Electrical activity was reviewed with band pass filter of 1-70Hz , sensitivity of 7 uV/mm, display speed of 47mm/sec with a 60Hz  notched filter applied as appropriate. EEG data were recorded continuously and digitally stored.  Video monitoring was available and reviewed as appropriate.   Description:   The posterior dominant rhythm consists of 8-9 Hz activity of moderate voltage (25-35 uV) seen predominantly in posterior head regions, symmetric and reactive to eye opening and eye closing.  Sleep was characterized by sleep spindles (12 to 14 Hz), maximal frontocentral region. EEG showed continuous generalized and lateralized left hemisphere 3 to 6 Hz theta-delta slowing admixed with 15-18Hz  beta activity. Lateralized periodic discharges ( LPDs) at 0.5-1hz  were also noted arising from left hemisphere.  Hyperventilation and photic stimulation were not performed.    Of note study was difficult to interpret due to significant electrode artifact.    ABNORMALITY - Lateralized periodic discharges, left hemisphere - Continuous slow, generalized and lateralized left hemisphere   IMPRESSION: This study showed evidence of epileptogenicity and  cortical dysfunction arising from left hemisphere likely secondary to underlying structural abnormality. Additionally there is moderate diffuse encephalopathy. No definite seizures were  seen.   Aidden Markovic [name]

## 2023-02-09 NOTE — Progress Notes (Signed)
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  Head CT and brain MRI were negative for acute abnormalities.  She has no previous history of seizures, no family history of seizures, no history of brain surgery, no history of meningitis or encephalitis. At baseline, she is alert and oriented, able to do all of her ADLs and cares for her husband.   S:// Patient is awake and asking for her daughter. She is still on LTM. LTM read this am with Lateralized periodic discharges, left hemisphere and generalized continuous slowing left hemisphere with moderate diffuse encephalopathy    O:// Current vital signs: BP (!) 152/67 (BP Location: Right Arm)   Pulse 77   Temp 97.6 F (36.4 C) (Oral)   Resp (!) 23   Wt 49.2 kg   SpO2 99%   BMI 22.67 kg/m  Vital signs in last 24 hours: Temp:  [97.4 F (36.3 C)-98.5 F (36.9 C)] 97.6 F (36.4 C) (05/29 0722) Pulse Rate:  [59-84] 77 (05/29 0730) Resp:  [12-25] 23 (05/29 0730) BP: (102-165)/(58-81) 152/67 (05/29 0722) SpO2:  [98 %-100 %] 99 % (05/29 0722)  GENERAL: elderly female in NAD.  HEENT: - Normocephalic and atraumatic, dry mm LUNGS - Clear to auscultation bilaterally with no wheezes CV - S1S2 RRR, no m/r/g, equal pulses bilaterally. ABDOMEN - Soft, nontender, nondistended with normoactive BS Ext: warm, well perfused, intact peripheral pulses, no edema  NEURO:  Mental Status: lying comfortably in bed.  She is oriented to self, birth year, and "hospital". Unable to answer other orientation questions correctly, some nonsensical answers She is able to intermittently follow very simple one step commands  Speech is clear. Not able to name objects or repeat. Poor attention Motor: moving all 4 extremities spontaneously  Tone: is normal and bulk is normal Sensation- withdraws in all 4  extremities  Coordination: unable to assess due to AMS Gait- deferred   Medications  Current Facility-Administered Medications:    acetaminophen (TYLENOL) tablet 650 mg, 650 mg, Oral, Q4H PRN **OR** acetaminophen (TYLENOL) suppository 650 mg, 650 mg, Rectal, Q4H PRN, Rodolph Bong, MD   amLODipine (NORVASC) tablet 10 mg, 10 mg, Oral, QHS, Leroy Sea, MD, 10 mg at 02/08/23 2156   atorvastatin (LIPITOR) tablet 40 mg, 40 mg, Oral, Daily, Susa Raring K, MD, 40 mg at 02/08/23 1610   carvedilol (COREG) tablet 3.125 mg, 3.125 mg, Oral, BID WC, Susa Raring K, MD, 3.125 mg at 02/08/23 1715   chlorhexidine gluconate (MEDLINE KIT) (PERIDEX) 0.12 % solution 15 mL, 15 mL, Mouth Rinse, BID, Rodolph Bong, MD, 15 mL at 02/08/23 0800   Chlorhexidine Gluconate Cloth 2 % PADS 6 each, 6 each, Topical, Daily, Leroy Sea, MD, 6 each at 02/08/23 1022   dorzolamide-timolol (COSOPT) 2-0.5 % ophthalmic solution 1 drop, 1 drop, Both Eyes, Daily, Rodolph Bong, MD, 1 drop at 02/08/23 1022   eltrombopag (PROMACTA) tablet 25 mg, 25 mg, Oral, Once per day on Mon Wed Thu Sat, Singh, Prashant K, MD, 25 mg at 02/09/23 9604   heparin injection 5,000 Units, 5,000 Units, Subcutaneous, Q8H, Leroy Sea, MD, 5,000 Units at 02/09/23 0518   hydrALAZINE (APRESOLINE) injection 5 mg, 5 mg, Intravenous, Q6H PRN, Rodolph Bong, MD   lacosamide (VIMPAT) 200 mg in sodium chloride 0.9 % 25 mL IVPB, 200 mg, Intravenous, Q12H, Wilford Corner,  Ashish, MD, Stopped at 02/09/23 0429   latanoprost (XALATAN) 0.005 % ophthalmic solution 1 drop, 1 drop, Both Eyes, QHS, Rodolph Bong, MD, 1 drop at 02/08/23 2154   levETIRAcetam (KEPPRA) IVPB 1500 mg/ 100 mL premix, 1,500 mg, Intravenous, Q12H, Milon Dikes, MD, Stopped at 02/08/23 2050   LORazepam (ATIVAN) injection 4 mg, 4 mg, Intravenous, Q5 Min x 2 PRN, Rodolph Bong, MD, 4 mg at 02/04/23 2018   ondansetron (ZOFRAN) tablet 4 mg, 4 mg, Oral, Q6H PRN  **OR** ondansetron (ZOFRAN) injection 4 mg, 4 mg, Intravenous, Q6H PRN, Rodolph Bong, MD   Oral care mouth rinse, 15 mL, Mouth Rinse, 10 times per day, Rodolph Bong, MD, 15 mL at 02/09/23 0520   pantoprazole (PROTONIX) injection 40 mg, 40 mg, Intravenous, QHS, Rodolph Bong, MD, 40 mg at 02/08/23 2158   polyethylene glycol (MIRALAX / GLYCOLAX) packet 17 g, 17 g, Oral, Daily PRN, Rodolph Bong, MD   potassium chloride (KLOR-CON) packet 40 mEq, 40 mEq, Oral, Once, Leroy Sea, MD   sertraline (ZOLOFT) tablet 25 mg, 25 mg, Oral, Daily, Susa Raring K, MD, 25 mg at 02/08/23 1610   sodium chloride flush (NS) 0.9 % injection 3 mL, 3 mL, Intravenous, Q12H, Rodolph Bong, MD, 3 mL at 02/08/23 2156   sorbitol 70 % solution 30 mL, 30 mL, Oral, Daily PRN, Rodolph Bong, MD   tamsulosin [facility]) capsule 0.4 mg, 0.4 mg, Oral, Daily, Susa Raring K, MD, 0.4 mg at 02/08/23 0833  Labs CBC    Component Value Date/Time   WBC 7.2 02/09/2023 0419   RBC 4.39 02/09/2023 0419   HGB 13.7 02/09/2023 0419   HGB 14.3 02/01/2023 0923   HCT 40.0 02/09/2023 0419   PLT 99 (L) 02/09/2023 0419   PLT 109 (L) 02/01/2023 0923   MCV 91.1 02/09/2023 0419   MCH 31.2 02/09/2023 0419   MCHC 34.3 02/09/2023 0419   RDW 12.8 02/09/2023 0419   LYMPHSABS 2.7 02/09/2023 0419   MONOABS 0.6 02/09/2023 0419   EOSABS 0.3 02/09/2023 0419   BASOSABS 0.0 02/09/2023 0419    CMP     Component Value Date/Time   NA 139 02/09/2023 0419   K 3.7 02/09/2023 0419   CL 105 02/09/2023 0419   CO2 23 02/09/2023 0419   GLUCOSE 101 (H) 02/09/2023 0419   BUN 6 (L) 02/09/2023 0419   CREATININE 0.68 02/09/2023 0419   CREATININE 0.81 02/01/2023 0923   CALCIUM 8.5 (L) 02/09/2023 0419   PROT 6.3 (L) 02/05/2023 0310   ALBUMIN 4.0 02/05/2023 0310   AST 30 02/05/2023 0310   AST 21 02/01/2023 0923   ALT 22 02/05/2023 0310   ALT 9 02/01/2023 0923   ALKPHOS 42 02/05/2023 0310   BILITOT 0.9 02/05/2023  0310   BILITOT 0.5 02/01/2023 0923   GFRNONAA >60 02/09/2023 0419   GFRNONAA >60 02/01/2023 0923   GFRAA >60 06/06/2020 1000     Lipid Panel     Component Value Date/Time   CHOL 139 02/05/2023 0621   TRIG 31 02/05/2023 0621   HDL 64 02/05/2023 0621   CHOLHDL 2.2 02/05/2023 0621   VLDL 6 02/05/2023 0621   LDLCALC 69 02/05/2023 0621     Imaging I have reviewed images in epic and the results pertinent to this consultation are:  CT-scan of the brain- No acute abnormalities   CTA head and neck: No LVO or hemodynamically significant stenosis   MRI examination of the brain-No  acute infarct, no acute abnormalities   EEG 5/29: Lateralized periodic discharges, left hemisphere, Continuous slow, generalized and lateralized left hemisphere   EEG 5/28: Lateralized periodic discharges, left hemisphere, Continuous slow, generalized and lateralized left hemisphere. Appears to be improving compared to previous day.   EEG 5/27: Lateralized periodic discharges, left hemisphere, Continuous slow, generalized and lateralized left hemisphere   EEG 5/26: 9 seizures without clinical signs arising from left hemisphere overnight, lasting about 10 seconds each, LPD's in left hemisphere   EEG 5/25: Focal electrographic status epilepticus in left hemisphere, maximal left parietal region which terminated when patient was loaded with Keppra, LPD's left hemisphere   Assessment: 82 year old patient with history of CLL, ITP and ICH presented with aphasia and generalized weakness.  Head CT and MRI were negative for acute abnormalities, but EEG revealed the patient to be in electrographic status epilepticus, which terminated after Keppra load.   - Initial EEG showed ongoing seizure activity and LPD's arising from left hemisphere, parietal lobe; this is the area where her previous ICH was.   - Patient is now on Keppra 1500 mg twice daily, and Vimpat 200 mg twice daily  - LTM EEG report for today: Lateralized  periodic discharges, left hemisphere. Continuous slow, generalized and lateralized left hemisphere.  This study showed evidence of epileptogenicity and  cortical dysfunction arising from left hemisphere likely secondary to underlying structural abnormality. Additionally there is moderate diffuse encephalopathy. No definite seizures were seen.   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 May discontinue tomorrow pending results.  - Continue maintenance Keppra at 1500 mg twice daily - Continue Vimpat 200 mg twice daily  - Will need outpatient Neurology follow up after discharge  - Will need to discuss [facility] statutes when mental status is improved: Per [employer] statutes, patients with seizures are not allowed to drive until they have been seizure-free for six months. Use caution when using heavy equipment or power tools. Avoid working on ladders or at heights. Take showers instead of baths. Ensure the water temperature is not too high on the home water heater. Do not go swimming alone. Do not lock yourself in a room alone (i.e. bathroom). When caring for infants or small children, sit down when holding, feeding, or changing them to minimize risk of injury to the child in the event you have a seizure. Maintain good sleep hygiene. Avoid alcohol.    Gevena Mart DNP, ACNPC-AG  [facility]  Electronically signed: Dr. Caryl Pina

## 2023-02-10 DIAGNOSIS — R569 Unspecified convulsions: Secondary | ICD-10-CM | POA: Diagnosis not present

## 2023-02-10 LAB — AMMONIA: Ammonia: 27 umol/L (ref 9–35)

## 2023-02-10 LAB — CBC WITH DIFFERENTIAL/PLATELET
Abs Immature Granulocytes: 0.01 10*3/uL (ref 0.00–0.07)
Basophils Absolute: 0 10*3/uL (ref 0.0–0.1)
Basophils Relative: 0 %
Eosinophils Absolute: 0.1 10*3/uL (ref 0.0–0.5)
Eosinophils Relative: 2 %
HCT: 41 % (ref 36.0–46.0)
Hemoglobin: 14.3 g/dL (ref 12.0–15.0)
Immature Granulocytes: 0 %
Lymphocytes Relative: 33 %
Lymphs Abs: 2.3 10*3/uL (ref 0.7–4.0)
MCH: 31.8 pg (ref 26.0–34.0)
MCHC: 34.9 g/dL (ref 30.0–36.0)
MCV: 91.3 fL (ref 80.0–100.0)
Monocytes Absolute: 0.7 10*3/uL (ref 0.1–1.0)
Monocytes Relative: 9 %
Neutro Abs: 4 10*3/uL (ref 1.7–7.7)
Neutrophils Relative %: 56 %
Platelets: 105 10*3/uL — ABNORMAL LOW (ref 150–400)
RBC: 4.49 MIL/uL (ref 3.87–5.11)
RDW: 12.9 % (ref 11.5–15.5)
WBC: 7.1 10*3/uL (ref 4.0–10.5)
nRBC: 0 % (ref 0.0–0.2)

## 2023-02-10 LAB — BASIC METABOLIC PANEL
Anion gap: 11 (ref 5–15)
BUN: 6 mg/dL — ABNORMAL LOW (ref 8–23)
CO2: 23 mmol/L (ref 22–32)
Calcium: 8.8 mg/dL — ABNORMAL LOW (ref 8.9–10.3)
Chloride: 106 mmol/L (ref 98–111)
Creatinine, Ser: 0.66 mg/dL (ref 0.44–1.00)
GFR, Estimated: >60 mL/min (ref >60)
Glucose, Bld: 116 mg/dL — ABNORMAL HIGH (ref 70–99)
Potassium: 3.5 mmol/L (ref 3.5–5.1)
Sodium: 140 mmol/L (ref 135–145)

## 2023-02-10 LAB — PHOSPHORUS: Phosphorus: 3.1 mg/dL (ref 2.5–4.6)

## 2023-02-10 MED ORDER — HYDRALAZINE HCL 25 MG PO TABS
25.0000 mg | ORAL_TABLET | Freq: Four times a day (QID) | ORAL | Status: DC
  Administered 2023-02-10 – 2023-02-12 (×7): 25 mg via ORAL
  Filled 2023-02-10 (×6): qty 1

## 2023-02-10 MED ORDER — QUETIAPINE FUMARATE 25 MG PO TABS
12.5000 mg | ORAL_TABLET | ORAL | Status: DC
  Administered 2023-02-10: 12.5 mg via ORAL
  Filled 2023-02-10: qty 1

## 2023-02-10 MED ORDER — MIDAZOLAM HCL 2 MG/2ML IJ SOLN: 1.0000 mg | INTRAMUSCULAR | Status: DC | PRN

## 2023-02-10 MED ORDER — SILVER SULFADIAZINE 1 % EX CREA
TOPICAL_CREAM | Freq: Two times a day (BID) | CUTANEOUS | Status: DC
  Administered 2023-02-10 – 2023-02-22 (×6): 1 via TOPICAL
  Filled 2023-02-10: qty 85

## 2023-02-10 MED ORDER — CLONAZEPAM 0.25 MG PO TBDP
0.2500 mg | ORAL_TABLET | Freq: Two times a day (BID) | ORAL | Status: DC
  Administered 2023-02-10 – 2023-02-11 (×3): 0.25 mg via ORAL
  Filled 2023-02-10 (×3): qty 1

## 2023-02-10 MED ORDER — SODIUM CHLORIDE 0.9 % IV SOLN
2000.0000 mg | Freq: Two times a day (BID) | INTRAVENOUS | Status: DC
  Administered 2023-02-10 – 2023-02-14 (×9): 2000 mg via INTRAVENOUS
  Filled 2023-02-10 (×11): qty 20

## 2023-02-10 NOTE — Progress Notes (Signed)
Subjective: No acute events overnight.  Requesting to sit up in bed.  ROS: Unable to obtain due to poor mental status  Examination  Vital signs in last 24 hours: Temp:  [97.2 F (36.2 C)-98.2 F (36.8 C)] 97.5 F (36.4 C) (05/30 0827) Pulse Rate:  [66-84] 84 (05/30 0914) Resp:  [14-22] 18 (05/30 0434) BP: (131-178)/(58-71) 138/65 (05/30 1423) SpO2:  [100 %] 100 % (05/30 0434)  General: lying in bed, NAD Neuro: Awake, alert, able to tell me her name, able to tell me she is in the hospital after giving 3 choices, not oriented to time, did follow commands like showing me 2 fingers but was not able to name objects and was mumbling to herself, perseverating on sitting up, PERRLA, EOMI, blinks to threat bilaterally, no facial asymmetry, at least 4/5 strength in bilateral upper extremities and 3/5 in bilateral lower extremities (unable to participate in further strength testing)  Basic Metabolic Panel: Recent Labs  Lab 02/05/23 0310 02/06/23 0400 02/07/23 0342 02/08/23 0213 02/09/23 0419 02/10/23 0444  NA 139 137 139 138 139 140  K 3.6 3.3* 2.9* 3.7 3.7 3.5  CL 106 106 108 108 105 106  CO2 22 24 23  21* 23 23  GLUCOSE 124* 111* 119* 114* 101* 116*  BUN 14 9 5* <5* 6* 6*  CREATININE 0.76 0.66 0.60 0.61 0.68 0.66  CALCIUM 8.6* 7.8* 7.7* 8.2* 8.5* 8.8*  MG 1.9 2.1 1.9 2.0 2.0  --   PHOS  --   --   --   --   --  3.1    CBC: Recent Labs  Lab 02/06/23 0400 02/07/23 0342 02/08/23 0213 02/09/23 0419 02/10/23 0444  WBC 8.8 8.1 8.2 7.2 7.1  NEUTROABS 4.9 5.1 4.4 3.6 4.0  HGB 12.7 13.2 14.1 13.7 14.3  HCT 37.7 38.4 40.9 40.0 41.0  MCV 92.0 92.1 89.1 91.1 91.3  PLT 101* 94* 106* 99* 105*     Coagulation Studies: No results for input(s): "LABPROT", "INR" in the last 72 hours.  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    Focal electrographic status epilepticus, resolved ICH, POA -Seizures most likely secondary to underlying ICH - Status epilepticus has since resolved.  However EEG continues to show lateralized periodic discharges and patient continues to have expressive aphasia after more than 72 hours.  At times, LPDs can be ictal.  Another differential would be prolonged postictal aphasia   Recommendations -Patient keeps trying to pull her electrodes.  Therefore I will DC LTM EEG for now and start Klonopin 0.25 mg twice daily to see if there is any improvement in speech -Continue Keppra 2000 mg twice daily and Vimpat 200 mg twice daily -Will obtain EKG to look for PR and QT interval.  If normal, can add Seroquel 12.5 mg at 1700 to avoid sundowning -Continue seizure precautions -As needed IV Versed for clinical seizures lasting more than 2 minutes -Discussed plan with Dr. Randol Kern via secure chat  I have spent a total of  38 minutes with the patient reviewing hospital notes,  test results, labs and examining the patient as well as establishing an assessment and plan.  > 50% of time was spent in direct patient care.   Lindie Spruce Epilepsy [facility] For  questions after 5pm please refer to AMION to reach the Neurologist on call

## 2023-02-10 NOTE — Progress Notes (Signed)
Speech Language Pathology Treatment: Dysphagia;Cognitive-Linquistic  Patient Details Name: Selena Branch MRN: [id number] DOB: [birth year] Today's Date: 02/10/2023 Time: 4098-1191 SLP Time Calculation (min) (ACUTE ONLY): 11 min  Assessment / Plan / Recommendation Clinical Impression  Compared to previous documentation pt is making progress toward swallow and cognitive goals. Her restraints were on her wrist but not tied on arrival. She comprehends most questions but does have difficulty switching topics and will comment on prior conversation when asked questions. She was able to state she was in the hospital because she had seizures. Needed cues to problem solve contacting nurse if needed but able to recall and point to call button with 3-4 minute delay.  She was observed with regular texture for potential upgrade and able to self feed. Given multiple trials of graham cracker solid she initiated mastication, transited timely without oral residue requesting liquids x 1. There were no s/s aspiration over consecutive sips thin via straw. SLP upgraded to regular texture, continue thin liquids, pills with thin as tolerated. She will need intermittent supervision/set up to ensure she is initiating feeding given continued cognitive deficits. ST will continue to treat.    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  Head CT and brain MRI were negative for acute abnormalities.  She has no previous history of seizures, no family history of seizures, no history of brain surgery, no history of meningitis or encephalitis, and history of 1 fall in which she hit her head and had to have stitches but no other serious head injuries.  At baseline, she is alert and oriented, able to do all of her ADLs and cares for her husband.      SLP Plan  Continue with current plan of  care      Recommendations for follow up therapy are one component of a multi-disciplinary discharge planning process, led by the attending physician.  Recommendations may be updated based on patient status, additional functional criteria and insurance authorization.    Recommendations  Diet recommendations: Regular;Thin liquid Liquids provided via: Cup;Straw Medication Administration: Whole meds with liquid Supervision: Patient able to self feed;Full supervision/cueing for compensatory strategies Compensations: Minimize environmental distractions;Slow rate;Small sips/bites Postural Changes and/or Swallow Maneuvers: Seated upright 90 degrees                  Oral care BID   Frequent or constant Supervision/Assistance Cognitive communication deficit (R41.841);Dysphagia, unspecified (R13.10)     Continue with current plan of care     Royce Macadamia  02/10/2023, 9:52 AM

## 2023-02-10 NOTE — Progress Notes (Signed)
LTM EEG discontinued - no skin breakdown at unhook.   

## 2023-02-10 NOTE — Progress Notes (Signed)
Patient has repeatedly torn off wires multiple times over the last six days despite restraints. Patient tore off leads on 02/09/23 and was re-hooked. [facility] phoned and said patient removed leads again. No maintenance or re-hook at this time.

## 2023-02-10 NOTE — Progress Notes (Signed)
Went to pt room and seen that pt had removed eeg leads, mitts applied . Discussed with EEG tech , see tech's note. Provider on call aware.

## 2023-02-10 NOTE — TOC Progression Note (Signed)
Transition of Care [facility]) - Progression Note    Patient Details  Name: Selena Branch MRN: [id number] Date of Birth: October 08, 1940  [facility] CM/SW Contact  Mearl Latin, LCSW Phone Number: 02/10/2023, 9:25 AM  Clinical Narrative:    CSW continuing to follow for SNF needs once medically appropriate.    Expected Discharge Plan: Skilled Nursing Facility Barriers to Discharge: Continued Medical Work up, English as a second language teacher, Requiring sitter/restraints, SNF Pending bed offer  Expected Discharge Plan and Services In-house Referral: Clinical Social Work   Post Acute Care Choice: Skilled Nursing Facility Living arrangements for the past 2 months: Single Family Home                                       Social Determinants of Health (SDOH) Interventions SDOH Screenings   Depression (PHQ2-9): Low Risk  (09/28/2019)  Tobacco Use: Low Risk  (02/04/2023)    Readmission Risk Interventions     No data to display

## 2023-02-10 NOTE — Plan of Care (Signed)
  Problem: Education: Goal: Expressions of having a comfortable level of knowledge regarding the disease process will increase Outcome: Progressing   Problem: Coping: Goal: Ability to adjust to condition or change in health will improve Outcome: Progressing Goal: Ability to identify appropriate support needs will improve Outcome: Progressing   Problem: Health Behavior/Discharge Planning: Goal: Compliance with prescribed medication regimen will improve Outcome: Progressing   Problem: Medication: Goal: Risk for medication side effects will decrease Outcome: Progressing   Problem: Clinical Measurements: Goal: Complications related to the disease process, condition or treatment will be avoided or minimized Outcome: Progressing Goal: Diagnostic test results will improve Outcome: Progressing   Problem: Safety: Goal: Verbalization of understanding the information provided will improve Outcome: Progressing   Problem: Self-Concept: Goal: Level of anxiety will decrease Outcome: Progressing Goal: Ability to verbalize feelings about condition will improve Outcome: Progressing   Problem: Education: Goal: Knowledge of General Education information will improve Description: Including pain rating scale, medication(s)/side effects and non-pharmacologic comfort measures Outcome: Progressing   Problem: Health Behavior/Discharge Planning: Goal: Ability to manage health-related needs will improve Outcome: Progressing   Problem: Clinical Measurements: Goal: Ability to maintain clinical measurements within normal limits will improve Outcome: Progressing Goal: Will remain free from infection Outcome: Progressing Goal: Diagnostic test results will improve Outcome: Progressing Goal: Respiratory complications will improve Outcome: Progressing Goal: Cardiovascular complication will be avoided Outcome: Progressing   Problem: Activity: Goal: Risk for activity intolerance will  decrease Outcome: Progressing   Problem: Nutrition: Goal: Adequate nutrition will be maintained Outcome: Progressing   Problem: Coping: Goal: Level of anxiety will decrease Outcome: Progressing   Problem: Elimination: Goal: Will not experience complications related to bowel motility Outcome: Progressing Goal: Will not experience complications related to urinary retention Outcome: Progressing   Problem: Pain Managment: Goal: General experience of comfort will improve Outcome: Progressing   Problem: Safety: Goal: Ability to remain free from injury will improve Outcome: Progressing   Problem: Skin Integrity: Goal: Risk for impaired skin integrity will decrease Outcome: Progressing   Problem: Safety: Goal: Non-violent Restraint(s) Outcome: Progressing   

## 2023-02-10 NOTE — Progress Notes (Signed)
PROGRESS NOTE                                                                                                                                                                                                             Patient Demographics:    Selena Branch, is a 82 y.o. female, DOB - Mar 09, 1941, UJW:[id number]  Outpatient Primary MD for the patient is Gweneth Dimitri, MD    LOS - 5  Admit date - 02/04/2023    Chief Complaint  Patient presents with   Code Stroke       Brief Narrative (HPI from H&P)     82 y.o. female with medical history significant of CLL currently in remission, glaucoma, hypertension, thrombocytopenia, hyperlipidemia presenting to the ED with sudden onset altered mental status/aphasia, this happened while she was on the phone at home, she was doing fine but suddenly became unresponsive and confused.  She was brought to the ER where she was seen by neurology and admitted for evaluation of expressive aphasia and possible stroke workup.   Subjective:   Is not able to provide any reliable complaints today, but she was restless where she pulled her EEG leads twice yesterday.  .   Assessment  & Plan :   Metabolic encephalopathy, expressive aphasia.  Due to status epilepticus, MRI brain, CTA head and neck unremarkable, on Keppra & Vimpat, case discussed with neurology 02/07/23, placed on IV fluids, PT, OT speech eval.  -Management per neurology, she is on LTM EEG, she is currently on increased dose Keppra and Vimpat, management per neurology -Seizure activity arising from left hemisphere, parietal lobe, this is the area of her previous ICH .   Glaucoma   - resume home eyedrops.   Hypertension  -pressure remains elevated, so we will add scheduled hydralazine, continue with Coreg, amlodipine, as needed hydralazine as well    Hyperlipidemia  - on statin    CLL/thrombocytopenia  - Stable.  Outpatient follow-up with  oncology.   History of intracerebral hemorrhage  - Stable.  Urinary retention with possible UTI.  Foley placed, Flomax, 3 days of Rocephin.  Can attempt voiding trial when she is more appropriate and ambulatory  Hypokalemia.  Replaced.  Will start on Silvadene ointment at site of right forehead erythema      Condition - Extremely Guarded  Family Communication  :  none at bedside, D/W daughter by phone 5/29  Code Status :  DNR  Consults  :  Neuro  PUD Prophylaxis : PPI   Procedures  :     MRI brain, CTA head and neck.  No stroke, no significant occlusion in any vessel.  EEG.  Positive for possible status epilepticus.      Disposition Plan  :    Status is: Inpatient  DVT Prophylaxis  :    heparin injection 5,000 Units Start: 02/05/23 1400 SCDs Start: 02/04/23 1729    Lab Results  Component Value Date   PLT 105 (L) 02/10/2023    Diet :  Diet Order             Diet regular Room service appropriate? No; Fluid consistency: Thin  Diet effective now                    Inpatient Medications  Scheduled Meds:  amLODipine  10 mg Oral QHS   atorvastatin  40 mg Oral Daily   carvedilol  3.125 mg Oral BID WC   chlorhexidine gluconate (MEDLINE KIT)  15 mL Mouth Rinse BID   Chlorhexidine Gluconate Cloth  6 each Topical Daily   clonazepam  0.25 mg Oral BID   dorzolamide-timolol  1 drop Both Eyes Daily   eltrombopag  25 mg Oral Once per day on Mon Wed Thu Sat   heparin injection (subcutaneous)  5,000 Units Subcutaneous Q8H   latanoprost  1 drop Both Eyes QHS   mouth rinse  15 mL Mouth Rinse 10 times per day   pantoprazole (PROTONIX) IV  40 mg Intravenous QHS   potassium chloride  40 mEq Oral Once   sertraline  25 mg Oral Daily   silver sulfADIAZINE   Topical BID   sodium chloride flush  3 mL Intravenous Q12H   tamsulosin  0.4 mg Oral Daily   Continuous Infusions:  lacosamide (VIMPAT) IV 200 mg (02/10/23 1112)   levETIRAcetam 2,000 mg (02/10/23 0925)   PRN  Meds:.acetaminophen **OR** acetaminophen, hydrALAZINE, LORazepam, ondansetron **OR** ondansetron (ZOFRAN) IV, polyethylene glycol, sorbitol  Antibiotics  :    Anti-infectives (From admission, onward)    Start     Dose/Rate Route Frequency Ordered Stop   02/08/23 0600  cefTRIAXone (ROCEPHIN) 1 g in sodium chloride 0.9 % 100 mL IVPB        1 g 200 mL/hr over 30 Minutes Intravenous Every 24 hours 02/07/23 0600 02/09/23 0547   02/07/23 0700  cefTRIAXone (ROCEPHIN) 1 g in sodium chloride 0.9 % 100 mL IVPB        1 g 200 mL/hr over 30 Minutes Intravenous  Once 02/07/23 0601 02/07/23 0645         Objective:   Vitals:   02/10/23 0239 02/10/23 0434 02/10/23 0827 02/10/23 0914  BP: (!) 146/62 (!) 173/71  (!) 158/69  Pulse: 69 77  84  Resp: 20 18    Temp: 97.7 F (36.5 C) 97.6 F (36.4 C) (!) 97.5 F (36.4 C)   TempSrc: Oral Oral Oral   SpO2: 100% 100%    Weight:        Wt Readings from Last 3 Encounters:  02/04/23 49.2 kg  10/29/22 48.3 kg  07/30/22 48.8 kg     Intake/Output Summary (Last 24 hours) at 02/10/2023 1153 Last data filed at 02/10/2023 0619 Gross per 24 hour  Intake --  Output 1350 ml  Net -1350 ml     Physical Exam  Awake, she remains confused, repetitive Held mild erythema at the right forehead site where she pulled 1 of EEG leads. Symmetrical Chest wall movement, Good air movement bilaterally, CTAB RRR,No Gallops,Rubs or new Murmurs, No Parasternal Heave +ve B.Sounds, Abd Soft, No tenderness, No rebound - guarding or rigidity. No Cyanosis, Clubbing or edema, No new Rash or bruise        Data Review:    Recent Labs  Lab 02/06/23 0400 02/07/23 0342 02/08/23 0213 02/09/23 0419 02/10/23 0444  WBC 8.8 8.1 8.2 7.2 7.1  HGB 12.7 13.2 14.1 13.7 14.3  HCT 37.7 38.4 40.9 40.0 41.0  PLT 101* 94* 106* 99* 105*  MCV 92.0 92.1 89.1 91.1 91.3  MCH 31.0 31.7 30.7 31.2 31.8  MCHC 33.7 34.4 34.5 34.3 34.9  RDW 12.9 12.5 12.7 12.8 12.9  LYMPHSABS 3.1 2.1  2.8 2.7 2.3  MONOABS 0.7 0.7 0.7 0.6 0.7  EOSABS 0.1 0.2 0.3 0.3 0.1  BASOSABS 0.0 0.0 0.0 0.0 0.0    Recent Labs  Lab 02/04/23 1501 02/04/23 1508 02/05/23 0310 02/05/23 0621 02/06/23 0400 02/07/23 0342 02/08/23 0213 02/09/23 0419 02/10/23 0444  NA 139   < > 139  --  137 139 138 139 140  K 3.5   < > 3.6  --  3.3* 2.9* 3.7 3.7 3.5  CL 104   < > 106  --  106 108 108 105 106  CO2 26  --  22  --  24 23 21* 23 23  ANIONGAP 9  --  11  --  7 8 9 11 11   GLUCOSE 155*   < > 124*  --  111* 119* 114* 101* 116*  BUN 15   < > 14  --  9 5* <5* 6* 6*  CREATININE 0.99   < > 0.76  --  0.66 0.60 0.61 0.68 0.66  AST 28  --  30  --   --   --   --   --   --   ALT 14  --  22  --   --   --   --   --   --   ALKPHOS 53  --  42  --   --   --   --   --   --   BILITOT 0.5  --  0.9  --   --   --   --   --   --   ALBUMIN 4.2  --  4.0  --   --   --   --   --   --   INR 1.0  --   --   --   --   --   --   --   --   TSH  --   --   --  0.618  --   --   --   --   --   HGBA1C  --   --   --  5.6  --   --   --   --   --   AMMONIA  --   --   --   --   --   --   --   --  27  BNP  --   --   --   --  286.2* 154.1* 89.9 20.9  --   MG  --   --  1.9  --  2.1 1.9 2.0 2.0  --   CALCIUM 9.5  --  8.6*  --  7.8* 7.7* 8.2* 8.5* 8.8*   < > = values in this interval not displayed.     Lab Results  Component Value Date   CHOL 139 02/05/2023   HDL 64 02/05/2023   LDLCALC 69 02/05/2023   TRIG 31 02/05/2023   CHOLHDL 2.2 02/05/2023    Lab Results  Component Value Date   HGBA1C 5.6 02/05/2023      Radiology Reports No results found.    Signature  -   Huey Bienenstock M.D on 02/10/2023 at 11:53 AM   -  To page go to [email or web address]

## 2023-02-11 ENCOUNTER — Inpatient Hospital Stay (HOSPITAL_COMMUNITY): Payer: PPO

## 2023-02-11 DIAGNOSIS — R569 Unspecified convulsions: Secondary | ICD-10-CM | POA: Diagnosis not present

## 2023-02-11 LAB — GLUCOSE, CAPILLARY: Glucose-Capillary: 113 mg/dL — ABNORMAL HIGH (ref 70–99)

## 2023-02-11 LAB — CBC
HCT: 33 % — ABNORMAL LOW (ref 36.0–46.0)
Hemoglobin: 11.3 g/dL — ABNORMAL LOW (ref 12.0–15.0)
MCH: 31.5 pg (ref 26.0–34.0)
MCHC: 34.2 g/dL (ref 30.0–36.0)
MCV: 91.9 fL (ref 80.0–100.0)
Platelets: 108 10*3/uL — ABNORMAL LOW (ref 150–400)
RBC: 3.59 MIL/uL — ABNORMAL LOW (ref 3.87–5.11)
RDW: 13.2 % (ref 11.5–15.5)
WBC: 9.4 10*3/uL (ref 4.0–10.5)
nRBC: 0 % (ref 0.0–0.2)

## 2023-02-11 LAB — BASIC METABOLIC PANEL
Anion gap: 10 (ref 5–15)
BUN: 9 mg/dL (ref 8–23)
CO2: 22 mmol/L (ref 22–32)
Calcium: 8.5 mg/dL — ABNORMAL LOW (ref 8.9–10.3)
Chloride: 108 mmol/L (ref 98–111)
Creatinine, Ser: 0.93 mg/dL (ref 0.44–1.00)
GFR, Estimated: >60 mL/min (ref >60)
Glucose, Bld: 125 mg/dL — ABNORMAL HIGH (ref 70–99)
Potassium: 3.7 mmol/L (ref 3.5–5.1)
Sodium: 140 mmol/L (ref 135–145)

## 2023-02-11 LAB — PHOSPHORUS
Phosphorus: 3.6 mg/dL (ref 2.5–4.6)
Phosphorus: 4.4 mg/dL (ref 2.5–4.6)

## 2023-02-11 LAB — MAGNESIUM
Magnesium: 2 mg/dL (ref 1.7–2.4)
Magnesium: 2.1 mg/dL (ref 1.7–2.4)

## 2023-02-11 MED ORDER — PROSOURCE TF20 ENFIT COMPATIBL EN LIQD
60.0000 mL | Freq: Every day | ENTERAL | Status: DC
  Administered 2023-02-11 – 2023-02-14 (×4): 60 mL
  Filled 2023-02-11 (×4): qty 60

## 2023-02-11 MED ORDER — FREE WATER
130.0000 mL | Status: DC
  Administered 2023-02-11 – 2023-02-15 (×18): 130 mL

## 2023-02-11 MED ORDER — CLONAZEPAM 0.25 MG PO TBDP
0.5000 mg | ORAL_TABLET | Freq: Two times a day (BID) | ORAL | Status: DC
  Administered 2023-02-11: 0.5 mg via ORAL
  Filled 2023-02-11: qty 2

## 2023-02-11 MED ORDER — THIAMINE MONONITRATE 100 MG PO TABS
100.0000 mg | ORAL_TABLET | Freq: Every day | ORAL | Status: DC
  Administered 2023-02-11: 100 mg via ORAL
  Filled 2023-02-11: qty 1

## 2023-02-11 MED ORDER — ADULT MULTIVITAMIN W/MINERALS CH
1.0000 | ORAL_TABLET | Freq: Every day | ORAL | Status: DC
  Administered 2023-02-11: 1 via ORAL
  Filled 2023-02-11: qty 1

## 2023-02-11 MED ORDER — QUETIAPINE FUMARATE 25 MG PO TABS
25.0000 mg | ORAL_TABLET | ORAL | Status: DC
  Administered 2023-02-11: 25 mg via ORAL
  Filled 2023-02-11: qty 1

## 2023-02-11 MED ORDER — THIAMINE HCL 100 MG/ML IJ SOLN
500.0000 mg | Freq: Once | INTRAVENOUS | Status: AC
  Administered 2023-02-11: 500 mg via INTRAVENOUS
  Filled 2023-02-11: qty 5

## 2023-02-11 MED ORDER — ELTROMBOPAG OLAMINE 25 MG PO TABS: 25.0000 mg | ORAL_TABLET | ORAL | Status: DC

## 2023-02-11 MED ORDER — ELTROMBOPAG OLAMINE 25 MG PO TABS
25.0000 mg | ORAL_TABLET | ORAL | Status: DC
  Administered 2023-02-12 – 2023-02-28 (×8): 25 mg via ORAL
  Filled 2023-02-11 (×8): qty 1

## 2023-02-11 MED ORDER — JEVITY 1.5 CAL/FIBER PO LIQD
1000.0000 mL | ORAL | Status: DC
  Administered 2023-02-11 – 2023-02-14 (×4): 1000 mL
  Filled 2023-02-11 (×7): qty 1000

## 2023-02-11 NOTE — Progress Notes (Signed)
Initial Nutrition Assessment  DOCUMENTATION CODES:   Non-severe (moderate) malnutrition in context of chronic illness  INTERVENTION:  Tube Feeds via Cortrak: Start Jevity 1.5 at 20 mL/hr; advance by 10 mL every 12 hours to goal rate of 40 mL/hr (960 mL/day) 60 mL ProSource TF20 - daily Free water flush: 130 mL q4h Tube Feeds at goal provides 1520 kcal, 81 gm protein, and 1510 mL total free water daily. Monitor magnesium, potassium, and phosphorus BID for at least 3 days, MD to replete as needed, as pt is at risk for refeeding syndrome given malnutrition and poor PO intake. Recommend Thiamine 100 mg x 5 days for refeeding risk. Discussed with MD. Multivitamin w/ minerals daily Encourage good PO intake  Ok for family to bring outside food  NUTRITION DIAGNOSIS:   Moderate Malnutrition related to chronic illness as evidenced by mild fat depletion, moderate muscle depletion.  GOAL:   Patient will meet greater than or equal to 90% of their needs  MONITOR:   PO intake, TF tolerance, I & O's, Labs  REASON FOR ASSESSMENT:   Consult Enteral/tube feeding initiation and management  ASSESSMENT:   82 y.o. female presented to the ED with AMS/aphasia. PMH includes HTN, HLD, CLL, and glaucoma. Pt admitted with possible seizures.   5/24 - admitted 5/27 - diet advanced to full liquids 5/30 - diet advanced to regular 5/31 - Cortrak placed (tip gastric/proximal duodenal)  Discussed pt with team in rounds. MD would like to proceed with Cortrak placement.   Pt laying in bed, confused, unable to provide any history to RD.  This RD called daughter, Arline Asp, discussed placement of Cortrak to assist in meeting calories and protein needs. Daughter agreeable as she is worried about pt weight. Daughter has brought pt a protein shake yesterday from home and said pt liked it and drank it well. Daughter stated that she ate a slice of bacon and a few bites of banana this morning. Encourage daughter to  continue to bring things from home that pt request. Also placed menu in pt room as daughter plans to order meals for pt that she would normally eat.   Per EMR, pt weight appears stable over the past year.   Medications reviewed and include: Protonix Labs reviewed: Sodium 140, Potassium 3.7, Phosphorus 3.6, Magnesium 2.0, Hgb A1c 5.6  NUTRITION - FOCUSED PHYSICAL EXAM:  Flowsheet Row Most Recent Value  Orbital Region Mild depletion  Upper Arm Region Mild depletion  Thoracic and Lumbar Region Mild depletion  Buccal Region Mild depletion  Temple Region Mild depletion  Clavicle Bone Region Moderate depletion  Clavicle and Acromion Bone Region Moderate depletion  Scapular Bone Region Moderate depletion  Dorsal Hand Unable to assess  Patellar Region Moderate depletion  Anterior Thigh Region Moderate depletion  Posterior Calf Region Severe depletion  Edema (RD Assessment) None  Hair Reviewed  Eyes Unable to assess  Mouth Unable to assess  Skin Reviewed  Nails Unable to assess   Diet Order:   Diet Order             Diet regular Room service appropriate? No; Fluid consistency: Thin  Diet effective now                   EDUCATION NEEDS:   Not appropriate for education at this time  Skin:  Skin Assessment: Reviewed RN Assessment  Last BM:  Unknown  Height:  Ht Readings from Last 1 Encounters:  05/29/21 4\' 10"  (1.473 m)   Weight:  Wt Readings from Last 1 Encounters:  02/04/23 49.2 kg    BMI:  Body mass index is 22.67 kg/m.  Estimated Nutritional Needs:  Kcal:  1400-1600 Protein:  70-90 grams Fluid:  >/= 1.5 L   Kirby Crigler RD, LDN Clinical Dietitian See [facility] for contact information.

## 2023-02-11 NOTE — Progress Notes (Signed)
PROGRESS NOTE                                                                                                                                                                                                             Patient Demographics:    Selena Branch, is a 82 y.o. female, DOB - 13-Jan-1941, FAO:[id number]  Outpatient Primary MD for the patient is Gweneth Dimitri, MD    LOS - 6  Admit date - 02/04/2023    Chief Complaint  Patient presents with   Code Stroke       Brief Narrative (HPI from H&P)     82 y.o. female with medical history significant of CLL currently in remission, glaucoma, hypertension, thrombocytopenia, hyperlipidemia presenting to the ED with sudden onset altered mental status/aphasia, this happened while she was on the phone at home, she was doing fine but suddenly became unresponsive and confused.  She was brought to the ER where she was seen by neurology and admitted for evaluation of expressive aphasia and possible stroke workup.   Subjective:   She was agitated overnight, kept on wrist restraint, she pulled her IV out this morning, but overall she appears to be more conversant and coherent   Assessment  & Plan :   Metabolic encephalopathy, expressive aphasia.  Due to status epilepticus, MRI brain, CTA head and neck unremarkable, on Keppra & Vimpat, case discussed with neurology 02/07/23, placed on IV fluids, PT, OT speech eval.  -Management per neurology,  on LTM EEG, this has been discontinued as she pulled her leads multiple times. -AED management per neurology, Keppra dose has been increased, started on Vimpat, as well she has been started on clonazepam which is being uptitrated slowly and gradually which should help with both agitation and seizures.  -Seizure activity arising from left hemisphere, parietal lobe, this is the area of her previous ICH .  Hospital delirium-encephalopathy initially  related to her seizures, postictal status, currently she seems to be having hospital delirium as well for which she has been started on Seroquel.   Glaucoma   - resume home eyedrops.   Hypertension  -pressure remains elevated, so we will add scheduled hydralazine, continue with Coreg, amlodipine, as needed hydralazine as well    Hyperlipidemia  - on statin    CLL/thrombocytopenia  - Stable.  Outpatient follow-up with oncology.  History of intracerebral hemorrhage  - Stable.  Urinary retention with possible UTI.  Foley placed, Flomax, 3 days of Rocephin.  Can attempt voiding trial when she is more appropriate and ambulatory  Hypokalemia.  Replaced.  on Silvadene ointment at site of right forehead erythema      Condition - Extremely Guarded  Family Communication  : none at bedside, D/W daughter by phone 5/29  Code Status :  DNR  Consults  :  Neuro  PUD Prophylaxis : PPI   Procedures  :     MRI brain, CTA head and neck.  No stroke, no significant occlusion in any vessel.  EEG.  Positive for possible status epilepticus.      Disposition Plan  :    Status is: Inpatient  DVT Prophylaxis  :    heparin injection 5,000 Units Start: 02/05/23 1400 SCDs Start: 02/04/23 1729    Lab Results  Component Value Date   PLT 108 (L) 02/11/2023    Diet :  Diet Order             Diet regular Room service appropriate? No; Fluid consistency: Thin  Diet effective now                    Inpatient Medications  Scheduled Meds:  amLODipine  10 mg Oral QHS   atorvastatin  40 mg Oral Daily   carvedilol  3.125 mg Oral BID WC   chlorhexidine gluconate (MEDLINE KIT)  15 mL Mouth Rinse BID   Chlorhexidine Gluconate Cloth  6 each Topical Daily   clonazepam  0.25 mg Oral BID   dorzolamide-timolol  1 drop Both Eyes Daily   eltrombopag  25 mg Oral Once per day on Mon Wed Thu Sat   heparin injection (subcutaneous)  5,000 Units Subcutaneous Q8H   hydrALAZINE  25 mg Oral Q6H    latanoprost  1 drop Both Eyes QHS   mouth rinse  15 mL Mouth Rinse 10 times per day   pantoprazole (PROTONIX) IV  40 mg Intravenous QHS   potassium chloride  40 mEq Oral Once   QUEtiapine  12.5 mg Oral Q24H   sertraline  25 mg Oral Daily   silver sulfADIAZINE   Topical BID   sodium chloride flush  3 mL Intravenous Q12H   tamsulosin  0.4 mg Oral Daily   Continuous Infusions:  lacosamide (VIMPAT) IV Stopped (02/10/23 2156)   levETIRAcetam Stopped (02/11/23 0124)   PRN Meds:.acetaminophen **OR** acetaminophen, hydrALAZINE, midazolam, ondansetron **OR** ondansetron (ZOFRAN) IV, polyethylene glycol, sorbitol  Antibiotics  :    Anti-infectives (From admission, onward)    Start     Dose/Rate Route Frequency Ordered Stop   02/08/23 0600  cefTRIAXone (ROCEPHIN) 1 g in sodium chloride 0.9 % 100 mL IVPB        1 g 200 mL/hr over 30 Minutes Intravenous Every 24 hours 02/07/23 0600 02/09/23 0547   02/07/23 0700  cefTRIAXone (ROCEPHIN) 1 g in sodium chloride 0.9 % 100 mL IVPB        1 g 200 mL/hr over 30 Minutes Intravenous  Once 02/07/23 0601 02/07/23 0645         Objective:   Vitals:   02/10/23 2134 02/11/23 0114 02/11/23 0437 02/11/23 0800  BP: (!) 102/52 (!) 140/63 120/76 (!) 124/59  Pulse:   80 85  Resp:   18 16  Temp:   97.8 F (36.6 C) (!) 96.7 F (35.9 C)  TempSrc:   Oral Oral  SpO2:   97% 97%  Weight:        Wt Readings from Last 3 Encounters:  02/04/23 49.2 kg  10/29/22 48.3 kg  07/30/22 48.8 kg     Intake/Output Summary (Last 24 hours) at 02/11/2023 1107 Last data filed at 02/11/2023 0444 Gross per 24 hour  Intake 945.65 ml  Output 600 ml  Net 345.65 ml     Physical Exam  Awake Alert, she is oriented x 2 today, appears to be more coherent, but remains significantly confused and altered far from her baseline. Symmetrical Chest wall movement, Good air movement bilaterally, CTAB RRR,No Gallops,Rubs or new Murmurs, No Parasternal Heave +ve B.Sounds, Abd  Soft, No tenderness, No rebound - guarding or rigidity. No Cyanosis, Clubbing or edema, No new Rash or bruise        Data Review:    Recent Labs  Lab 02/06/23 0400 02/07/23 0342 02/08/23 0213 02/09/23 0419 02/10/23 0444 02/11/23 0456  WBC 8.8 8.1 8.2 7.2 7.1 9.4  HGB 12.7 13.2 14.1 13.7 14.3 11.3*  HCT 37.7 38.4 40.9 40.0 41.0 33.0*  PLT 101* 94* 106* 99* 105* 108*  MCV 92.0 92.1 89.1 91.1 91.3 91.9  MCH 31.0 31.7 30.7 31.2 31.8 31.5  MCHC 33.7 34.4 34.5 34.3 34.9 34.2  RDW 12.9 12.5 12.7 12.8 12.9 13.2  LYMPHSABS 3.1 2.1 2.8 2.7 2.3  --   MONOABS 0.7 0.7 0.7 0.6 0.7  --   EOSABS 0.1 0.2 0.3 0.3 0.1  --   BASOSABS 0.0 0.0 0.0 0.0 0.0  --     Recent Labs  Lab 02/04/23 1501 02/04/23 1508 02/05/23 0310 02/05/23 0621 02/06/23 0400 02/07/23 0342 02/08/23 0213 02/09/23 0419 02/10/23 0444 02/11/23 0456  NA 139   < > 139  --  137 139 138 139 140 140  K 3.5   < > 3.6  --  3.3* 2.9* 3.7 3.7 3.5 3.7  CL 104   < > 106  --  106 108 108 105 106 108  CO2 26  --  22  --  24 23 21* 23 23 22   ANIONGAP 9  --  11  --  7 8 9 11 11 10   GLUCOSE 155*   < > 124*  --  111* 119* 114* 101* 116* 125*  BUN 15   < > 14  --  9 5* <5* 6* 6* 9  CREATININE 0.99   < > 0.76  --  0.66 0.60 0.61 0.68 0.66 0.93  AST 28  --  30  --   --   --   --   --   --   --   ALT 14  --  22  --   --   --   --   --   --   --   ALKPHOS 53  --  42  --   --   --   --   --   --   --   BILITOT 0.5  --  0.9  --   --   --   --   --   --   --   ALBUMIN 4.2  --  4.0  --   --   --   --   --   --   --   INR 1.0  --   --   --   --   --   --   --   --   --  TSH  --   --   --  0.618  --   --   --   --   --   --   HGBA1C  --   --   --  5.6  --   --   --   --   --   --   AMMONIA  --   --   --   --   --   --   --   --  27  --   BNP  --   --   --   --  286.2* 154.1* 89.9 20.9  --   --   MG  --    < > 1.9  --  2.1 1.9 2.0 2.0  --  2.0  CALCIUM 9.5  --  8.6*  --  7.8* 7.7* 8.2* 8.5* 8.8* 8.5*   < > = values in this interval not  displayed.     Lab Results  Component Value Date   CHOL 139 02/05/2023   HDL 64 02/05/2023   LDLCALC 69 02/05/2023   TRIG 31 02/05/2023   CHOLHDL 2.2 02/05/2023    Lab Results  Component Value Date   HGBA1C 5.6 02/05/2023      Radiology Reports No results found.    Signature  -   Huey Bienenstock M.D on 02/11/2023 at 11:07 AM   -  To page go to [email or web address]

## 2023-02-11 NOTE — Procedures (Signed)
Cortrak  Person Inserting Tube:  Kendell Bane C, RD Tube Type:  Cortrak - 43 inches Tube Size:  10 Tube Location:  Left nare Secured by: Bridle Technique Used to Measure Tube Placement:  Marking at nare/corner of mouth Cortrak Secured At:  58 cm   Cortrak Tube Team Note:  Consult received to place a Cortrak feeding tube.   X-ray is required, abdominal x-ray has been ordered by the Cortrak team. Please confirm tube placement before using the Cortrak tube.   If the tube becomes dislodged please keep the tube and contact the Cortrak team at [email or web address] for replacement.  If after hours and replacement cannot be delayed, place a NG tube and confirm placement with an abdominal x-ray.    Cammy Copa., RD, LDN, CNSC See [facility] for contact information

## 2023-02-11 NOTE — Progress Notes (Signed)
PT Cancellation Note  Patient Details Name: Selena Branch MRN: [id number] DOB: 1941-04-11   Cancelled Treatment:    PT attempted to see pt for treatment, per RN pt recently restrained again due to removing her own IV, not consistently following commands at this time, requesting PT hold. PT will attempt to follow up as time allows.   Arlyss Gandy 02/11/2023, 5:13 PM

## 2023-02-11 NOTE — Progress Notes (Addendum)
Subjective: Continues to be agitated overnight, pulled out her IV this morning.  No family at bedside.  However, I was able to call [name] who states she was in the hospital earlier with patient husband.  Patient was more communicative and also making jokes with the family.  ROS: Unable to obtain due to poor mental status  Examination  Vital signs in last 24 hours: Temp:  [96.7 F (35.9 C)-97.8 F (36.6 C)] 96.7 F (35.9 C) (05/31 0800) Pulse Rate:  [80-203] 85 (05/31 0800) Resp:  [0-30] 16 (05/31 0800) BP: (89-147)/(41-76) 124/59 (05/31 0800) SpO2:  [93 %-100 %] 97 % (05/31 0800)  General: lying in bed, NAD Neuro: Awake, alert, able to tell me her name, not oriented to time and place, Did not follow commands, was not able to name objects and was repeatedly asking me to help her get up and use the bathroom, PERRLA, EOMI, blinks to threat bilaterally, no facial asymmetry, at least 4/5 strength in bilateral upper extremities and 3/5 in bilateral lower extremities (unable to participate in further strength testing)  Basic Metabolic Panel: Recent Labs  Lab 02/06/23 0400 02/07/23 0342 02/08/23 0213 02/09/23 0419 02/10/23 0444 02/11/23 0456  NA 137 139 138 139 140 140  K 3.3* 2.9* 3.7 3.7 3.5 3.7  CL 106 108 108 105 106 108  CO2 24 23 21* 23 23 22   GLUCOSE 111* 119* 114* 101* 116* 125*  BUN 9 5* <5* 6* 6* 9  CREATININE 0.66 0.60 0.61 0.68 0.66 0.93  CALCIUM 7.8* 7.7* 8.2* 8.5* 8.8* 8.5*  MG 2.1 1.9 2.0 2.0  --  2.0  PHOS  --   --   --   --  3.1 3.6    CBC: Recent Labs  Lab 02/06/23 0400 02/07/23 0342 02/08/23 0213 02/09/23 0419 02/10/23 0444 02/11/23 0456  WBC 8.8 8.1 8.2 7.2 7.1 9.4  NEUTROABS 4.9 5.1 4.4 3.6 4.0  --   HGB 12.7 13.2 14.1 13.7 14.3 11.3*  HCT 37.7 38.4 40.9 40.0 41.0 33.0*  MCV 92.0 92.1 89.1 91.1 91.3 91.9  PLT 101* 94* 106* 99* 105* 108*     Coagulation Studies: No results for input(s): "LABPROT", "INR" in the last 72  hours.  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     Focal electrographic status epilepticus, resolved ICH, POA -Seizures most likely secondary to underlying ICH - Status epilepticus has since resolved.  However EEG continues to show lateralized periodic discharges and patient continues to have expressive aphasia after more than 72 hours.  At times, LPDs can be ictal.  Another differential would be prolonged postictal aphasia    Recommendations - Increase clonazepam to 0.5 mg twice daily to help with agitation and seizures -Continue Keppra 2000 mg twice daily and Vimpat 200 mg twice daily -Increase Seroquel to 25 mg to be administered at 5 PM to minimize sundowning -Patient is DNR so need to be careful about not over sedating her -Continue seizure precautions, delirium precautions -As needed IV Versed for clinical seizures lasting more than 2 minutes -Discussed plan with Dr. Randol Kern via secure chat and [name] by phone   I have spent a total of  35 minutes with the patient reviewing hospital notes,  test results, labs  and examining the patient as well as establishing an assessment and plan.  > 50% of time was spent in direct patient care.   Lindie Spruce Epilepsy [facility] For questions after 5pm please refer to AMION to reach the Neurologist on call

## 2023-02-11 NOTE — Plan of Care (Signed)
  Problem: Education: Goal: Expressions of having a comfortable level of knowledge regarding the disease process will increase Outcome: Progressing   Problem: Coping: Goal: Ability to adjust to condition or change in health will improve Outcome: Progressing Goal: Ability to identify appropriate support needs will improve Outcome: Progressing   Problem: Health Behavior/Discharge Planning: Goal: Compliance with prescribed medication regimen will improve Outcome: Progressing   Problem: Medication: Goal: Risk for medication side effects will decrease Outcome: Progressing   Problem: Clinical Measurements: Goal: Complications related to the disease process, condition or treatment will be avoided or minimized Outcome: Progressing Goal: Diagnostic test results will improve Outcome: Progressing   Problem: Safety: Goal: Verbalization of understanding the information provided will improve Outcome: Progressing   Problem: Self-Concept: Goal: Level of anxiety will decrease Outcome: Progressing Goal: Ability to verbalize feelings about condition will improve Outcome: Progressing   Problem: Education: Goal: Knowledge of General Education information will improve Description: Including pain rating scale, medication(s)/side effects and non-pharmacologic comfort measures Outcome: Progressing   Problem: Health Behavior/Discharge Planning: Goal: Ability to manage health-related needs will improve Outcome: Progressing   Problem: Clinical Measurements: Goal: Ability to maintain clinical measurements within normal limits will improve Outcome: Progressing Goal: Will remain free from infection Outcome: Progressing Goal: Diagnostic test results will improve Outcome: Progressing Goal: Respiratory complications will improve Outcome: Progressing Goal: Cardiovascular complication will be avoided Outcome: Progressing   Problem: Activity: Goal: Risk for activity intolerance will  decrease Outcome: Progressing   Problem: Nutrition: Goal: Adequate nutrition will be maintained Outcome: Progressing   Problem: Coping: Goal: Level of anxiety will decrease Outcome: Progressing   Problem: Elimination: Goal: Will not experience complications related to bowel motility Outcome: Progressing Goal: Will not experience complications related to urinary retention Outcome: Progressing   Problem: Pain Managment: Goal: General experience of comfort will improve Outcome: Progressing   Problem: Safety: Goal: Ability to remain free from injury will improve Outcome: Progressing   Problem: Skin Integrity: Goal: Risk for impaired skin integrity will decrease Outcome: Progressing   Problem: Safety: Goal: Non-violent Restraint(s) Outcome: Progressing   

## 2023-02-11 NOTE — Progress Notes (Addendum)
Pt wrist bilaterally red. Pt is frustrated and asking for them to please be removed. Pt is confused but is not pulling at her IV or tele leads. Restraints removed, ROM completed, and Pt repositioned. Hand mittens placed on pt and RN at bedside while IVF are infusing. 75 ml urine output noted in foley. Foley and pericare completed. Soft wrist restraints reapplied with 2 fingers space underneath.  RN removed restraints to assist with ambulating plus 2 people to the bathroom as she stated she needed to have a bowel movement. Pt didn't have a bm. She was assisted back to the bed safely. Restraints reapplied. Floor mats are in place. Bed alarm is set.  Pt is restless, slept for maybe 1 hour.   Pt continues to state "please get me up." Pt speech is clear.

## 2023-02-12 ENCOUNTER — Inpatient Hospital Stay (HOSPITAL_COMMUNITY): Payer: PPO

## 2023-02-12 DIAGNOSIS — R569 Unspecified convulsions: Secondary | ICD-10-CM | POA: Diagnosis not present

## 2023-02-12 DIAGNOSIS — D649 Anemia, unspecified: Secondary | ICD-10-CM | POA: Diagnosis not present

## 2023-02-12 DIAGNOSIS — E440 Moderate protein-calorie malnutrition: Secondary | ICD-10-CM | POA: Insufficient documentation

## 2023-02-12 LAB — HEPATIC FUNCTION PANEL
ALT: 20 U/L (ref 0–44)
AST: 31 U/L (ref 15–41)
Albumin: 3.1 g/dL — ABNORMAL LOW (ref 3.5–5.0)
Alkaline Phosphatase: 46 U/L (ref 38–126)
Bilirubin, Direct: 0.1 mg/dL (ref 0.0–0.2)
Indirect Bilirubin: 0.3 mg/dL (ref 0.3–0.9)
Total Bilirubin: 0.4 mg/dL (ref 0.3–1.2)
Total Protein: 5 g/dL — ABNORMAL LOW (ref 6.5–8.1)

## 2023-02-12 LAB — BASIC METABOLIC PANEL
Anion gap: 7 (ref 5–15)
BUN: 21 mg/dL (ref 8–23)
CO2: 24 mmol/L (ref 22–32)
Calcium: 8.3 mg/dL — ABNORMAL LOW (ref 8.9–10.3)
Chloride: 109 mmol/L (ref 98–111)
Creatinine, Ser: 1.06 mg/dL — ABNORMAL HIGH (ref 0.44–1.00)
GFR, Estimated: 52 mL/min — ABNORMAL LOW (ref >60)
Glucose, Bld: 143 mg/dL — ABNORMAL HIGH (ref 70–99)
Potassium: 3.7 mmol/L (ref 3.5–5.1)
Sodium: 140 mmol/L (ref 135–145)

## 2023-02-12 LAB — CBC
HCT: 23.9 % — ABNORMAL LOW (ref 36.0–46.0)
HCT: 22.5 % — ABNORMAL LOW (ref 36.0–46.0)
HCT: 23.1 % — ABNORMAL LOW (ref 36.0–46.0)
HCT: 26.8 % — ABNORMAL LOW (ref 36.0–46.0)
Hemoglobin: 8.1 g/dL — ABNORMAL LOW (ref 12.0–15.0)
Hemoglobin: 7.7 g/dL — ABNORMAL LOW (ref 12.0–15.0)
Hemoglobin: 7.9 g/dL — ABNORMAL LOW (ref 12.0–15.0)
Hemoglobin: 9.4 g/dL — ABNORMAL LOW (ref 12.0–15.0)
MCH: 31.6 pg (ref 26.0–34.0)
MCH: 32.6 pg (ref 26.0–34.0)
MCH: 31.6 pg (ref 26.0–34.0)
MCH: 31.5 pg (ref 26.0–34.0)
MCHC: 33.9 g/dL (ref 30.0–36.0)
MCHC: 34.2 g/dL (ref 30.0–36.0)
MCHC: 34.2 g/dL (ref 30.0–36.0)
MCHC: 35.1 g/dL (ref 30.0–36.0)
MCV: 93.4 fL (ref 80.0–100.0)
MCV: 95.3 fL (ref 80.0–100.0)
MCV: 92.4 fL (ref 80.0–100.0)
MCV: 89.9 fL (ref 80.0–100.0)
Platelets: 120 10*3/uL — ABNORMAL LOW (ref 150–400)
Platelets: 117 10*3/uL — ABNORMAL LOW (ref 150–400)
Platelets: 130 10*3/uL — ABNORMAL LOW (ref 150–400)
Platelets: 134 10*3/uL — ABNORMAL LOW (ref 150–400)
RBC: 2.56 MIL/uL — ABNORMAL LOW (ref 3.87–5.11)
RBC: 2.36 MIL/uL — ABNORMAL LOW (ref 3.87–5.11)
RBC: 2.5 MIL/uL — ABNORMAL LOW (ref 3.87–5.11)
RBC: 2.98 MIL/uL — ABNORMAL LOW (ref 3.87–5.11)
RDW: 13.5 % (ref 11.5–15.5)
RDW: 13.5 % (ref 11.5–15.5)
RDW: 13.5 % (ref 11.5–15.5)
RDW: 14.7 % (ref 11.5–15.5)
WBC: 8.9 10*3/uL (ref 4.0–10.5)
WBC: 8.8 10*3/uL (ref 4.0–10.5)
WBC: 8.7 10*3/uL (ref 4.0–10.5)
WBC: 9 10*3/uL (ref 4.0–10.5)
nRBC: 0 % (ref 0.0–0.2)
nRBC: 0 % (ref 0.0–0.2)
nRBC: 0 % (ref 0.0–0.2)
nRBC: 0 % (ref 0.0–0.2)

## 2023-02-12 LAB — IRON AND TIBC
Iron: 23 ug/dL — ABNORMAL LOW (ref 28–170)
Saturation Ratios: 11 % (ref 10.4–31.8)
TIBC: 216 ug/dL — ABNORMAL LOW (ref 250–450)
UIBC: 193 ug/dL

## 2023-02-12 LAB — PHOSPHORUS
Phosphorus: 4 mg/dL (ref 2.5–4.6)
Phosphorus: 2.9 mg/dL (ref 2.5–4.6)

## 2023-02-12 LAB — GLUCOSE, CAPILLARY
Glucose-Capillary: 115 mg/dL — ABNORMAL HIGH (ref 70–99)
Glucose-Capillary: 119 mg/dL — ABNORMAL HIGH (ref 70–99)
Glucose-Capillary: 124 mg/dL — ABNORMAL HIGH (ref 70–99)
Glucose-Capillary: 129 mg/dL — ABNORMAL HIGH (ref 70–99)
Glucose-Capillary: 143 mg/dL — ABNORMAL HIGH (ref 70–99)
Glucose-Capillary: 143 mg/dL — ABNORMAL HIGH (ref 70–99)

## 2023-02-12 LAB — MAGNESIUM
Magnesium: 2.1 mg/dL (ref 1.7–2.4)
Magnesium: 2 mg/dL (ref 1.7–2.4)

## 2023-02-12 LAB — BPAM RBC

## 2023-02-12 LAB — TYPE AND SCREEN
ABO/RH(D): O POS
Antibody Screen: NEGATIVE

## 2023-02-12 LAB — FOLATE: Folate: 19.8 ng/mL (ref >5.9)

## 2023-02-12 LAB — VITAMIN B12: Vitamin B-12: 395 pg/mL (ref 180–914)

## 2023-02-12 LAB — RETICULOCYTES
Immature Retic Fract: 15.4 % (ref 2.3–15.9)
RBC.: 2.42 MIL/uL — ABNORMAL LOW (ref 3.87–5.11)
Retic Count, Absolute: 64.4 10*3/uL (ref 19.0–186.0)
Retic Ct Pct: 2.7 % (ref 0.4–3.1)

## 2023-02-12 LAB — FERRITIN: Ferritin: 51 ng/mL (ref 11–307)

## 2023-02-12 LAB — PREPARE RBC (CROSSMATCH)

## 2023-02-12 MED ORDER — POLYETHYLENE GLYCOL 3350 17 G PO PACK: 17.0000 g | PACK | Freq: Every day | ORAL | Status: DC | PRN

## 2023-02-12 MED ORDER — SORBITOL 70 % SOLN: 30.0000 mL | Freq: Every day | Status: DC | PRN

## 2023-02-12 MED ORDER — ADULT MULTIVITAMIN W/MINERALS CH
1.0000 | ORAL_TABLET | Freq: Every day | ORAL | Status: DC
  Administered 2023-02-12 – 2023-02-28 (×17): 1
  Filled 2023-02-12 (×17): qty 1

## 2023-02-12 MED ORDER — MAGNESIUM CITRATE PO SOLN
0.5000 | Freq: Once | ORAL | Status: AC
  Administered 2023-02-12: 0.5
  Filled 2023-02-12: qty 296

## 2023-02-12 MED ORDER — FUROSEMIDE 10 MG/ML IJ SOLN
20.0000 mg | Freq: Once | INTRAMUSCULAR | Status: DC
  Filled 2023-02-12: qty 2

## 2023-02-12 MED ORDER — FUROSEMIDE 10 MG/ML IJ SOLN
20.0000 mg | Freq: Once | INTRAMUSCULAR | Status: AC
  Administered 2023-02-12: 20 mg via INTRAVENOUS

## 2023-02-12 MED ORDER — ATORVASTATIN CALCIUM 40 MG PO TABS
40.0000 mg | ORAL_TABLET | Freq: Every day | ORAL | Status: DC
  Administered 2023-02-12 – 2023-02-28 (×17): 40 mg
  Filled 2023-02-12 (×9): qty 1
  Filled 2023-02-12: qty 4
  Filled 2023-02-12 (×7): qty 1

## 2023-02-12 MED ORDER — PANTOPRAZOLE SODIUM 40 MG IV SOLR
40.0000 mg | Freq: Two times a day (BID) | INTRAVENOUS | Status: DC
  Administered 2023-02-12 – 2023-02-23 (×23): 40 mg via INTRAVENOUS
  Filled 2023-02-12 (×22): qty 10

## 2023-02-12 MED ORDER — HYDRALAZINE HCL 25 MG PO TABS
25.0000 mg | ORAL_TABLET | Freq: Four times a day (QID) | ORAL | Status: DC
  Administered 2023-02-12 – 2023-02-19 (×24): 25 mg
  Filled 2023-02-12 (×27): qty 1

## 2023-02-12 MED ORDER — ACETAMINOPHEN 325 MG PO TABS
650.0000 mg | ORAL_TABLET | ORAL | Status: DC | PRN
  Administered 2023-02-13 – 2023-02-26 (×4): 650 mg
  Filled 2023-02-12 (×4): qty 2

## 2023-02-12 MED ORDER — ONDANSETRON HCL 4 MG/2ML IJ SOLN: 4.0000 mg | Freq: Four times a day (QID) | INTRAMUSCULAR | Status: DC | PRN

## 2023-02-12 MED ORDER — ACETAMINOPHEN 650 MG RE SUPP: 650.0000 mg | RECTAL | Status: DC | PRN

## 2023-02-12 MED ORDER — SERTRALINE HCL 25 MG PO TABS
25.0000 mg | ORAL_TABLET | Freq: Every day | ORAL | Status: DC
  Administered 2023-02-12 – 2023-02-23 (×12): 25 mg
  Filled 2023-02-12 (×12): qty 1

## 2023-02-12 MED ORDER — SODIUM CHLORIDE 0.9% IV SOLUTION: Freq: Once | INTRAVENOUS | Status: AC

## 2023-02-12 MED ORDER — ONDANSETRON HCL 4 MG PO TABS: 4.0000 mg | ORAL_TABLET | Freq: Four times a day (QID) | ORAL | Status: DC | PRN

## 2023-02-12 MED ORDER — THIAMINE MONONITRATE 100 MG PO TABS
100.0000 mg | ORAL_TABLET | Freq: Every day | ORAL | Status: AC
  Administered 2023-02-12 – 2023-02-15 (×4): 100 mg
  Filled 2023-02-12 (×4): qty 1

## 2023-02-12 MED ORDER — QUETIAPINE FUMARATE 25 MG PO TABS
25.0000 mg | ORAL_TABLET | ORAL | Status: DC
  Administered 2023-02-12 – 2023-02-15 (×4): 25 mg
  Filled 2023-02-12 (×4): qty 1

## 2023-02-12 MED ORDER — AMLODIPINE BESYLATE 10 MG PO TABS
10.0000 mg | ORAL_TABLET | Freq: Every day | ORAL | Status: DC
  Administered 2023-02-12 – 2023-02-19 (×6): 10 mg
  Filled 2023-02-12 (×7): qty 1

## 2023-02-12 MED ORDER — CLONAZEPAM 0.25 MG PO TBDP
0.5000 mg | ORAL_TABLET | Freq: Two times a day (BID) | ORAL | Status: DC
  Filled 2023-02-12: qty 2

## 2023-02-12 MED ORDER — CARVEDILOL 3.125 MG PO TABS
3.1250 mg | ORAL_TABLET | Freq: Two times a day (BID) | ORAL | Status: DC
  Administered 2023-02-12 – 2023-02-19 (×15): 3.125 mg
  Filled 2023-02-12 (×15): qty 1

## 2023-02-12 NOTE — Progress Notes (Signed)
EEG complete - results pending 

## 2023-02-12 NOTE — Progress Notes (Signed)
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  Head CT and brain MRI were negative for acute abnormalities.  She has no previous history of seizures, no family history of seizures, no history of brain surgery, no history of meningitis or encephalitis. At baseline, she is alert and oriented, able to do all of her ADLs and cares for her husband.   S:// Family at the bedside. Patient is in bilateral wrist restraints. She is sleepy this morning. Patient and family state she did not sleep well last night. Eyes are closed but arouses easily to voice. She is oriented to self, unable to answer other orientation questions correctly. She is asking to eat.   O:// Current vital signs: BP (!) 144/59 (BP Location: Left Arm)   Pulse 96   Temp 98.1 F (36.7 C) (Oral)   Resp 16   Wt 49.2 kg   SpO2 96%   BMI 22.67 kg/m  Vital signs in last 24 hours: Temp:  [97.6 F (36.4 C)-98.1 F (36.7 C)] 98.1 F (36.7 C) (06/01 0700) Pulse Rate:  [70-96] 96 (06/01 0808) Resp:  [16] 16 (06/01 0355) BP: (111-144)/(46-71) 144/59 (06/01 0700) SpO2:  [96 %-98 %] 96 % (06/01 0700)  GENERAL: elderly female in NAD. Lying comfortably in bed.  HEENT: - Normocephalic and atraumatic, dry mm LUNGS - Clear to auscultation bilaterally with no wheezes CV - S1S2 RRR, no m/r/g, equal pulses bilaterally. ABDOMEN - Soft, nontender, nondistended with normoactive BS Ext: warm, well perfused, intact peripheral pulses, no edema  NEURO:  Mental Status: She is oriented to self, unable to answer other orientation questions correctly. Can follow simple commands. Poor attention. Speech is clear.  Motor: Moving all 4 extremities spontaneously  Tone is normal and bulk is normal Sensation- Withdraws in all 4 extremities  Coordination: Unable to assess due to AMS Gait-  Deferred   Medications  Current Facility-Administered Medications:    acetaminophen (TYLENOL) tablet 650 mg, 650 mg, Oral, Q4H PRN, 650 mg at 02/11/23 0401 **OR** acetaminophen (TYLENOL) suppository 650 mg, 650 mg, Rectal, Q4H PRN, Rodolph Bong, MD   amLODipine (NORVASC) tablet 10 mg, 10 mg, Oral, QHS, Leroy Sea, MD, 10 mg at 02/11/23 2215   atorvastatin (LIPITOR) tablet 40 mg, 40 mg, Oral, Daily, Susa Raring K, MD, 40 mg at 02/11/23 1005   carvedilol (COREG) tablet 3.125 mg, 3.125 mg, Oral, BID WC, Susa Raring K, MD, 3.125 mg at 02/12/23 1610   chlorhexidine gluconate (MEDLINE KIT) (PERIDEX) 0.12 % solution 15 mL, 15 mL, Mouth Rinse, BID, Rodolph Bong, MD, 15 mL at 02/12/23 9604   Chlorhexidine Gluconate Cloth 2 % PADS 6 each, 6 each, Topical, Daily, Leroy Sea, MD, 6 each at 02/11/23 1005   clonazePAM (KLONOPIN) disintegrating tablet 0.5 mg, 0.5 mg, Oral, BID, Melynda Ripple, Priyanka O, MD, 0.5 mg at 02/11/23 2215   dorzolamide-timolol (COSOPT) 2-0.5 % ophthalmic solution 1 drop, 1 drop, Both Eyes, Daily, Rodolph Bong, MD, 1 drop at 02/11/23 1011   eltrombopag (PROMACTA) tablet 25 mg, 25 mg, Oral, Q M,W,F, Pham, Minh Q, RPH-CPP, 25 mg at 02/12/23 0807   feeding supplement (JEVITY 1.5 CAL/FIBER) liquid 1,000 mL, 1,000 mL, Per Tube, Continuous, Elgergawy, Leana Roe, MD, Last Rate: 30 mL/hr at 02/12/23 0700, Rate Change at 02/12/23 0700   feeding supplement (PROSource TF20) liquid 60 mL,  60 mL, Per Tube, Daily, Elgergawy, Leana Roe, MD, 60 mL at 02/11/23 1656   free water 130 mL, 130 mL, Per Tube, Q4H, Elgergawy, Leana Roe, MD, 130 mL at 02/12/23 0821   heparin injection 5,000 Units, 5,000 Units, Subcutaneous, Q8H, Leroy Sea, MD, 5,000 Units at 02/12/23 0646   hydrALAZINE (APRESOLINE) injection 5 mg, 5 mg, Intravenous, Q6H PRN, Rodolph Bong, MD   hydrALAZINE (APRESOLINE) tablet 25 mg, 25 mg, Oral, Q6H, Elgergawy, Leana Roe, MD, 25 mg at 02/12/23 0646    lacosamide (VIMPAT) 200 mg in sodium chloride 0.9 % 25 mL IVPB, 200 mg, Intravenous, Q12H, Milon Dikes, MD, Stopped at 02/11/23 2310   latanoprost (XALATAN) 0.005 % ophthalmic solution 1 drop, 1 drop, Both Eyes, QHS, Rodolph Bong, MD, 1 drop at 02/11/23 2216   levETIRAcetam (KEPPRA) 2,000 mg in sodium chloride 0.9 % 250 mL IVPB, 2,000 mg, Intravenous, Q12H, Charlsie Quest, MD, Stopped at 02/11/23 2231   midazolam (VERSED) injection 1 mg, 1 mg, Intravenous, Q4H PRN, Charlsie Quest, MD   multivitamin with minerals tablet 1 tablet, 1 tablet, Oral, Daily, Elgergawy, Leana Roe, MD, 1 tablet at 02/11/23 1656   ondansetron (ZOFRAN) tablet 4 mg, 4 mg, Oral, Q6H PRN **OR** ondansetron (ZOFRAN) injection 4 mg, 4 mg, Intravenous, Q6H PRN, Rodolph Bong, MD   Oral care mouth rinse, 15 mL, Mouth Rinse, 10 times per day, Rodolph Bong, MD, 15 mL at 02/12/23 0647   pantoprazole (PROTONIX) injection 40 mg, 40 mg, Intravenous, QHS, Rodolph Bong, MD, 40 mg at 02/11/23 2215   polyethylene glycol (MIRALAX / GLYCOLAX) packet 17 g, 17 g, Oral, Daily PRN, Rodolph Bong, MD   potassium chloride (KLOR-CON) packet 40 mEq, 40 mEq, Oral, Once, Leroy Sea, MD   QUEtiapine (SEROQUEL) tablet 25 mg, 25 mg, Oral, Q24H, Yadav, Priyanka O, MD, 25 mg at 02/11/23 1656   sertraline (ZOLOFT) tablet 25 mg, 25 mg, Oral, Daily, Susa Raring K, MD, 25 mg at 02/11/23 1005   silver sulfADIAZINE (SILVADENE) 1 % cream, , Topical, BID, Elgergawy, Leana Roe, MD, Given at 02/11/23 2215   sodium chloride flush (NS) 0.9 % injection 3 mL, 3 mL, Intravenous, Q12H, Rodolph Bong, MD, 3 mL at 02/11/23 2216   sorbitol 70 % solution 30 mL, 30 mL, Oral, Daily PRN, Rodolph Bong, MD   tamsulosin [facility]) capsule 0.4 mg, 0.4 mg, Oral, Daily, Susa Raring K, MD, 0.4 mg at 02/11/23 1005   thiamine (VITAMIN B1) tablet 100 mg, 100 mg, Oral, Daily, Elgergawy, Leana Roe, MD, 100 mg at 02/11/23  1656  Labs CBC    Component Value Date/Time   WBC 8.9 02/12/2023 0529   RBC 2.56 (L) 02/12/2023 0529   HGB 8.1 (L) 02/12/2023 0529   HGB 14.3 02/01/2023 0923   HCT 23.9 (L) 02/12/2023 0529   PLT 120 (L) 02/12/2023 0529   PLT 109 (L) 02/01/2023 0923   MCV 93.4 02/12/2023 0529   MCH 31.6 02/12/2023 0529   MCHC 33.9 02/12/2023 0529   RDW 13.5 02/12/2023 0529   LYMPHSABS 2.3 02/10/2023 0444   MONOABS 0.7 02/10/2023 0444   EOSABS 0.1 02/10/2023 0444   BASOSABS 0.0 02/10/2023 0444    CMP     Component Value Date/Time   NA 140 02/12/2023 0529   K 3.7 02/12/2023 0529   CL 109 02/12/2023 0529   CO2 24 02/12/2023 0529   GLUCOSE 143 (H) 02/12/2023 0529   BUN 21 02/12/2023  0529   CREATININE 1.06 (H) 02/12/2023 0529   CREATININE 0.81 02/01/2023 0923   CALCIUM 8.3 (L) 02/12/2023 0529   PROT 6.3 (L) 02/05/2023 0310   ALBUMIN 4.0 02/05/2023 0310   AST 30 02/05/2023 0310   AST 21 02/01/2023 0923   ALT 22 02/05/2023 0310   ALT 9 02/01/2023 0923   ALKPHOS 42 02/05/2023 0310   BILITOT 0.9 02/05/2023 0310   BILITOT 0.5 02/01/2023 0923   GFRNONAA 52 (L) 02/12/2023 0529   GFRNONAA >60 02/01/2023 0923   GFRAA >60 06/06/2020 1000     Lipid Panel     Component Value Date/Time   CHOL 139 02/05/2023 0621   TRIG 31 02/05/2023 0621   HDL 64 02/05/2023 0621   CHOLHDL 2.2 02/05/2023 0621   VLDL 6 02/05/2023 0621   LDLCALC 69 02/05/2023 0621     Imaging I have reviewed images in epic and the results pertinent to this consultation are:  CT-scan of the brain- No acute abnormalities   CTA head and neck: No LVO or hemodynamically significant stenosis   MRI examination of the brain-No acute infarct, no acute abnormalities    Assessment: 82 year old patient with history of CLL, ITP and ICH who presented on 5/24 with aphasia and generalized weakness.  Head CT and MRI were negative for acute abnormalities, but EEG revealed the patient to be in electrographic status epilepticus, which  terminated after Keppra load.   - On exam today she is sleepy but easily arousable. Poor attention, oriented to self, follows simple commands, moving all extremities  - No longer on LTM as the patient persistently kept tearing off electrodes. Last EEG reading was in the evening on 5/29, revealing left hemisphere LPDs. Will obtain spot EEG today to reassess.   Impression:  - New onset seizures with a history of ICH  - Focal electrographic status epilepticus, resolved - Status epilepticus has since resolved.  However EEG continues to show lateralized periodic discharges and patient continues to have expressive aphasia after more than 72 hours.  At times, LPDs can be ictal.  Another differential would be prolonged postictal aphasia   Recommendations: - Continue seizure precautions and delirium precautions. No driving after discharge.  - Continue clonazepam to 0.5 mg twice daily  - Continue Keppra 2000 mg twice daily and Vimpat 200 mg twice daily - Continue Seroquel 25 mg in the evening - As needed IV Versed for clinical seizures lasting more than 2 minutes - Patient is DNR so need to be careful about not over sedating her  - Will need outpatient Neurology follow up after discharge  - Spot EEG today (ordered)   Gevena Mart DNP, ACNPC-AG  [facility]

## 2023-02-12 NOTE — Progress Notes (Signed)
PROGRESS NOTE                                                                                                                                                                                                             Patient Demographics:    Selena Branch, is a 82 y.o. female, DOB - Feb 03, 1941, ZOX:[id number]  Outpatient Primary MD for the patient is Gweneth Dimitri, MD    LOS - 7  Admit date - 02/04/2023    Chief Complaint  Patient presents with   Code Stroke       Brief Narrative (HPI from H&P)     82 y.o. female with medical history significant of CLL currently in remission, glaucoma, hypertension, thrombocytopenia, hyperlipidemia presenting to the ED with sudden onset altered mental status/aphasia, this happened while she was on the phone at home, she was doing fine but suddenly became unresponsive and confused.  She was brought to the ER where she was seen by neurology and admitted for evaluation of expressive aphasia and possible stroke workup.  Her workup was significant for status epilepticus, please see discussion below   Subjective:   Could not sleep as discussed with staff after increasing her Seroquel, she remains confused with noncompliance remains on wrist restraint.     Assessment  & Plan :   Metabolic encephalopathy, expressive aphasia.  Due to status epilepticus,  - MRI brain, CTA head and neck unremarkable -Management per neurology,  on LTM EEG, this has been discontinued 5/30 as she pulled her leads multiple times. -AED management per neurology, Keppra dose has been increased, started on Vimpat, as well she has been started on clonazepam which is being uptitrated slowly and gradually which should help with both agitation and seizures.  -Seizure activity arising from left hemisphere, parietal lobe, this is the area of her previous ICH . -It appears to be more sleepy and drowsy today, I have discussed with  neurology, will go ahead and hold hold Klonopin altogether and reassess if Seroquel is will need to be tapered down  Anemia -Hemoglobin with significant drop over the last 48 hours 14> 11> 8, will check Hemoccult, anemia panel, will repeat CBC, keep empirically on IV Protonix and hold subcu heparin for now  Hospital delirium -encephalopathy initially related to her seizures, postictal status, currently she seems to be having hospital delirium  as well for which she has been started on Seroquel.   Glaucoma   - resume home eyedrops.   Hypertension   -It is on the lower side, will DC Klonopin, continue with Coreg, she remains on hydralazine.  Hyperlipidemia  - on statin    CLL/thrombocytopenia  - Stable.  Outpatient follow-up with oncology.   History of intracerebral hemorrhage  - Stable.  Urinary retention with possible UTI.   - Foley placed, Flomax, 3 days of Rocephin.  Can attempt voiding trial when she is more appropriate and ambulatory  Hypokalemia.   Replaced.  on Silvadene ointment at site of right forehead erythema      Condition - Extremely Guarded  Family Communication  : none at bedside, D/W daughter by phone 5/29  Code Status :  DNR  Consults  :  Neuro  PUD Prophylaxis : PPI   Procedures  :     MRI brain, CTA head and neck.  No stroke, no significant occlusion in any vessel.  EEG.  Positive for possible status epilepticus.      Disposition Plan  :    Status is: Inpatient  DVT Prophylaxis  :    heparin injection 5,000 Units Start: 02/05/23 1400 SCDs Start: 02/04/23 1729    Lab Results  Component Value Date   PLT 120 (L) 02/12/2023    Diet :  Diet Order             Diet regular Room service appropriate? No; Fluid consistency: Thin  Diet effective now                    Inpatient Medications  Scheduled Meds:  amLODipine  10 mg Oral QHS   atorvastatin  40 mg Oral Daily   carvedilol  3.125 mg Oral BID WC   chlorhexidine gluconate  (MEDLINE KIT)  15 mL Mouth Rinse BID   Chlorhexidine Gluconate Cloth  6 each Topical Daily   clonazepam  0.5 mg Oral BID   dorzolamide-timolol  1 drop Both Eyes Daily   eltrombopag  25 mg Oral Q M,W,F   feeding supplement (PROSource TF20)  60 mL Per Tube Daily   free water  130 mL Per Tube Q4H   heparin injection (subcutaneous)  5,000 Units Subcutaneous Q8H   hydrALAZINE  25 mg Oral Q6H   latanoprost  1 drop Both Eyes QHS   multivitamin with minerals  1 tablet Oral Daily   mouth rinse  15 mL Mouth Rinse 10 times per day   pantoprazole (PROTONIX) IV  40 mg Intravenous QHS   potassium chloride  40 mEq Oral Once   QUEtiapine  25 mg Oral Q24H   sertraline  25 mg Oral Daily   silver sulfADIAZINE   Topical BID   sodium chloride flush  3 mL Intravenous Q12H   tamsulosin  0.4 mg Oral Daily   thiamine  100 mg Oral Daily   Continuous Infusions:  feeding supplement (JEVITY 1.5 CAL/FIBER) 30 mL/hr at 02/12/23 0700   lacosamide (VIMPAT) IV Stopped (02/11/23 2310)   levETIRAcetam Stopped (02/11/23 2231)   PRN Meds:.acetaminophen **OR** acetaminophen, hydrALAZINE, midazolam, ondansetron **OR** ondansetron (ZOFRAN) IV, polyethylene glycol, sorbitol  Antibiotics  :    Anti-infectives (From admission, onward)    Start     Dose/Rate Route Frequency Ordered Stop   02/08/23 0600  cefTRIAXone (ROCEPHIN) 1 g in sodium chloride 0.9 % 100 mL IVPB        1 g 200 mL/hr over 30  Minutes Intravenous Every 24 hours 02/07/23 0600 02/09/23 0547   02/07/23 0700  cefTRIAXone (ROCEPHIN) 1 g in sodium chloride 0.9 % 100 mL IVPB        1 g 200 mL/hr over 30 Minutes Intravenous  Once 02/07/23 0601 02/07/23 0645         Objective:   Vitals:   02/12/23 0010 02/12/23 0355 02/12/23 0700 02/12/23 0808  BP: 126/67 130/71 (!) 144/59   Pulse: 87 75 70 96  Resp: 16 16    Temp: 98 F (36.7 C) 97.8 F (36.6 C) 98.1 F (36.7 C)   TempSrc: Oral Oral Oral   SpO2: 98% 98% 96%   Weight:        Wt Readings from  Last 3 Encounters:  02/04/23 49.2 kg  10/29/22 48.3 kg  07/30/22 48.8 kg     Intake/Output Summary (Last 24 hours) at 02/12/2023 0955 Last data filed at 02/12/2023 0800 Gross per 24 hour  Intake 1216.27 ml  Output 100 ml  Net 1116.27 ml     Physical Exam  She is more somnolent and less interactive today, he remains confused Frail, deconditioned Symmetrical Chest wall movement, Good air movement bilaterally, CTAB RRR,No Gallops,Rubs or new Murmurs, No Parasternal Heave +ve B.Sounds, Abd Soft, No tenderness, No rebound - guarding or rigidity. No Cyanosis, Clubbing or edema, No new Rash or bruise        Data Review:    Recent Labs  Lab 02/06/23 0400 02/07/23 0342 02/08/23 0213 02/09/23 0419 02/10/23 0444 02/11/23 0456 02/12/23 0529  WBC 8.8 8.1 8.2 7.2 7.1 9.4 8.9  HGB 12.7 13.2 14.1 13.7 14.3 11.3* 8.1*  HCT 37.7 38.4 40.9 40.0 41.0 33.0* 23.9*  PLT 101* 94* 106* 99* 105* 108* 120*  MCV 92.0 92.1 89.1 91.1 91.3 91.9 93.4  MCH 31.0 31.7 30.7 31.2 31.8 31.5 31.6  MCHC 33.7 34.4 34.5 34.3 34.9 34.2 33.9  RDW 12.9 12.5 12.7 12.8 12.9 13.2 13.5  LYMPHSABS 3.1 2.1 2.8 2.7 2.3  --   --   MONOABS 0.7 0.7 0.7 0.6 0.7  --   --   EOSABS 0.1 0.2 0.3 0.3 0.1  --   --   BASOSABS 0.0 0.0 0.0 0.0 0.0  --   --     Recent Labs  Lab 02/06/23 0400 02/07/23 0342 02/08/23 0213 02/09/23 0419 02/10/23 0444 02/11/23 0456 02/11/23 1623 02/12/23 0529  NA 137 139 138 139 140 140  --  140  K 3.3* 2.9* 3.7 3.7 3.5 3.7  --  3.7  CL 106 108 108 105 106 108  --  109  CO2 24 23 21* 23 23 22   --  24  ANIONGAP 7 8 9 11 11 10   --  7  GLUCOSE 111* 119* 114* 101* 116* 125*  --  143*  BUN 9 5* <5* 6* 6* 9  --  21  CREATININE 0.66 0.60 0.61 0.68 0.66 0.93  --  1.06*  AMMONIA  --   --   --   --  27  --   --   --   BNP 286.2* 154.1* 89.9 20.9  --   --   --   --   MG 2.1 1.9 2.0 2.0  --  2.0 2.1 2.1  CALCIUM 7.8* 7.7* 8.2* 8.5* 8.8* 8.5*  --  8.3*     Lab Results  Component Value Date    CHOL 139 02/05/2023   HDL 64 02/05/2023   LDLCALC 69  02/05/2023   TRIG 31 02/05/2023   CHOLHDL 2.2 02/05/2023    Lab Results  Component Value Date   HGBA1C 5.6 02/05/2023      Radiology Reports DG Abd Portable 1V  Result Date: 02/11/2023 CLINICAL DATA:  Feeding tube placement EXAM: PORTABLE ABDOMEN - 1 VIEW COMPARISON:  None Available. FINDINGS: Enteric tube tip is at the level of the gastric antrum/proximal duodenal. No dilated bowel loops are seen. No fractures are identified. IMPRESSION: Enteric tube tip is at the level of the gastric antrum/proximal duodenal. Electronically Signed   By: Darliss Cheney M.D.   On: 02/11/2023 15:33      Signature  -   Huey Bienenstock M.D on 02/12/2023 at 9:55 AM   -  To page go to [email or web address]

## 2023-02-13 ENCOUNTER — Inpatient Hospital Stay (HOSPITAL_COMMUNITY): Payer: PPO

## 2023-02-13 ENCOUNTER — Other Ambulatory Visit: Payer: Self-pay | Admitting: Hematology

## 2023-02-13 DIAGNOSIS — S301XXA Contusion of abdominal wall, initial encounter: Secondary | ICD-10-CM

## 2023-02-13 DIAGNOSIS — I619 Nontraumatic intracerebral hemorrhage, unspecified: Secondary | ICD-10-CM | POA: Diagnosis not present

## 2023-02-13 DIAGNOSIS — R4701 Aphasia: Secondary | ICD-10-CM | POA: Diagnosis not present

## 2023-02-13 DIAGNOSIS — R569 Unspecified convulsions: Secondary | ICD-10-CM | POA: Diagnosis not present

## 2023-02-13 DIAGNOSIS — C9111 Chronic lymphocytic leukemia of B-cell type in remission: Secondary | ICD-10-CM

## 2023-02-13 DIAGNOSIS — D696 Thrombocytopenia, unspecified: Secondary | ICD-10-CM | POA: Diagnosis not present

## 2023-02-13 DIAGNOSIS — R4182 Altered mental status, unspecified: Secondary | ICD-10-CM

## 2023-02-13 LAB — CBC
HCT: 33.4 % — ABNORMAL LOW (ref 36.0–46.0)
HCT: 33 % — ABNORMAL LOW (ref 36.0–46.0)
Hemoglobin: 11.3 g/dL — ABNORMAL LOW (ref 12.0–15.0)
Hemoglobin: 11.4 g/dL — ABNORMAL LOW (ref 12.0–15.0)
MCH: 29.3 pg (ref 26.0–34.0)
MCH: 31 pg (ref 26.0–34.0)
MCHC: 33.8 g/dL (ref 30.0–36.0)
MCHC: 34.5 g/dL (ref 30.0–36.0)
MCV: 86.5 fL (ref 80.0–100.0)
MCV: 89.7 fL (ref 80.0–100.0)
Platelets: 137 10*3/uL — ABNORMAL LOW (ref 150–400)
Platelets: 144 10*3/uL — ABNORMAL LOW (ref 150–400)
RBC: 3.86 MIL/uL — ABNORMAL LOW (ref 3.87–5.11)
RBC: 3.68 MIL/uL — ABNORMAL LOW (ref 3.87–5.11)
RDW: 16.2 % — ABNORMAL HIGH (ref 11.5–15.5)
RDW: 16.4 % — ABNORMAL HIGH (ref 11.5–15.5)
WBC: 10.3 10*3/uL (ref 4.0–10.5)
WBC: 10.7 10*3/uL — ABNORMAL HIGH (ref 4.0–10.5)
nRBC: 0 % (ref 0.0–0.2)
nRBC: 0 % (ref 0.0–0.2)

## 2023-02-13 LAB — GLUCOSE, CAPILLARY
Glucose-Capillary: 108 mg/dL — ABNORMAL HIGH (ref 70–99)
Glucose-Capillary: 111 mg/dL — ABNORMAL HIGH (ref 70–99)
Glucose-Capillary: 121 mg/dL — ABNORMAL HIGH (ref 70–99)
Glucose-Capillary: 121 mg/dL — ABNORMAL HIGH (ref 70–99)
Glucose-Capillary: 123 mg/dL — ABNORMAL HIGH (ref 70–99)
Glucose-Capillary: 129 mg/dL — ABNORMAL HIGH (ref 70–99)
Glucose-Capillary: 158 mg/dL — ABNORMAL HIGH (ref 70–99)

## 2023-02-13 LAB — BASIC METABOLIC PANEL
Anion gap: 15 (ref 5–15)
BUN: 13 mg/dL (ref 8–23)
CO2: 24 mmol/L (ref 22–32)
Calcium: 8.5 mg/dL — ABNORMAL LOW (ref 8.9–10.3)
Chloride: 101 mmol/L (ref 98–111)
Creatinine, Ser: 0.76 mg/dL (ref 0.44–1.00)
GFR, Estimated: >60 mL/min (ref >60)
Glucose, Bld: 117 mg/dL — ABNORMAL HIGH (ref 70–99)
Potassium: 3.6 mmol/L (ref 3.5–5.1)
Sodium: 140 mmol/L (ref 135–145)

## 2023-02-13 LAB — TYPE AND SCREEN

## 2023-02-13 LAB — PHOSPHORUS: Phosphorus: 3.2 mg/dL (ref 2.5–4.6)

## 2023-02-13 LAB — MAGNESIUM: Magnesium: 2.1 mg/dL (ref 1.7–2.4)

## 2023-02-13 LAB — OCCULT BLOOD X 1 CARD TO LAB, STOOL: Fecal Occult Bld: NEGATIVE

## 2023-02-13 LAB — BPAM RBC: Unit Type and Rh: 5100

## 2023-02-13 MED ORDER — VALPROATE SODIUM 100 MG/ML IV SOLN
15.0000 mg/kg/d | Freq: Three times a day (TID) | INTRAVENOUS | Status: DC
  Administered 2023-02-13 – 2023-02-14 (×2): 250 mg via INTRAVENOUS
  Filled 2023-02-13 (×5): qty 2.5

## 2023-02-13 MED ORDER — VALPROATE SODIUM 100 MG/ML IV SOLN
1000.0000 mg | Freq: Once | INTRAVENOUS | Status: AC
  Administered 2023-02-13: 1000 mg via INTRAVENOUS
  Filled 2023-02-13: qty 10

## 2023-02-13 NOTE — Progress Notes (Signed)
LTM EEG hooked up and running - no initial skin breakdown - push button tested - [facility] monitoring.  

## 2023-02-13 NOTE — Progress Notes (Signed)
PROGRESS NOTE                                                                                                                                                                                                             Patient Demographics:    Selena Branch, is a 82 y.o. female, DOB - [birth year], ZOX:[id number]  Outpatient Primary MD for the patient is Gweneth Dimitri, MD    LOS - 8  Admit date - 02/04/2023    Chief Complaint  Patient presents with   Code Stroke       Brief Narrative (HPI from H&P)     82 y.o. female with medical history significant of CLL currently in remission, glaucoma, hypertension, thrombocytopenia, hyperlipidemia presenting to the ED with sudden onset altered mental status/aphasia, this happened while she was on the phone at home, she was doing fine but suddenly became unresponsive and confused.  She was brought to the ER where she was seen by neurology and admitted for evaluation of expressive aphasia and possible stroke workup.  Her workup was significant for status epilepticus, as well her mentation complicated by hospital delerium, she did develop acute blood loss anemia related to aminal wall hematoma and hemoperitoneum.   Subjective:   With increased lethargy yesterday for which her clonazepam has been discontinued, she had significant drop in her hemoglobin, large BM yesterday with no melena.   Assessment  & Plan :   Metabolic encephalopathy, expressive aphasia.  Due to status epilepticus,  - MRI brain, CTA head and neck unremarkable -Management per neurology,  on LTM EEG, this has been discontinued 5/30 as she pulled her leads multiple times. -AED management per neurology, Keppra dose has been increased, started on Vimpat, as well she has been started on clonazepam , this has which is being uptitrated slowly and gradually which should help with both agitation and seizures.  -Seizure activity  arising from left hemisphere, parietal lobe, this is the area of her previous ICH . -Clonazepam has been discontinued due to increased lethargy -Back on LTM EEG overnight due to concern of seizures , AED management per neurology, continue with Vimpat and Keppra, Depakote was added.  Blood loss anemia due to abdominal wall hematoma and hemoperitoneum -Significant drop in hemoglobin from 14 baseline to 7.8, Hemoccult negative, CT abdomen pelvis significant for abdominal wall hematoma with  hemoperitoneum. -Thanks heparin has been discontinued. -Continue with supportive PRBC transfusion, she received 2 units of irradiated PRBC overnight with good response this morning, continue to monitor CBC closely.   Hospital delirium -Continue with Seroquel -Klonopin has been discontinued due to increased lethargy  Glaucoma   - resume home eyedrops.   Hypertension   -It is on the lower side, will DC Klonopin, continue with Coreg, she remains on hydralazine.  Hyperlipidemia  - on statin    CLL/thrombocytopenia  - Stable.  Outpatient follow-up with oncology.   History of intracerebral hemorrhage  - Stable.  Urinary retention with possible UTI.   - Foley placed, Flomax, 3 days of Rocephin.  Can attempt voiding trial when she is more appropriate and ambulatory  Hypokalemia.   Replaced.  on Silvadene ointment at site of right forehead erythema      Condition - Extremely Guarded  Family Communication  : none at bedside, D/W daughter, husband and multiple family members at bedside 6/1, discussed with daughter by phone today.  Code Status :  DNR  Consults  :  Neuro  PUD Prophylaxis : PPI   Procedures  :     MRI brain, CTA head and neck.  No stroke, no significant occlusion in any vessel.  EEG.  Positive for possible status epilepticus.      Disposition Plan  :    Status is: Inpatient  DVT Prophylaxis  :    SCDs Start: 02/04/23 1729    Lab Results  Component Value Date   PLT 137  (L) 02/13/2023    Diet :  Diet Order             Diet regular Room service appropriate? No; Fluid consistency: Thin  Diet effective now                    Inpatient Medications  Scheduled Meds:  amLODipine  10 mg Per Tube QHS   atorvastatin  40 mg Per Tube Daily   carvedilol  3.125 mg Per Tube BID WC   chlorhexidine gluconate (MEDLINE KIT)  15 mL Mouth Rinse BID   Chlorhexidine Gluconate Cloth  6 each Topical Daily   dorzolamide-timolol  1 drop Both Eyes Daily   eltrombopag  25 mg Oral Q M,W,F   feeding supplement (PROSource TF20)  60 mL Per Tube Daily   free water  130 mL Per Tube Q4H   furosemide  20 mg Intravenous Once   hydrALAZINE  25 mg Per Tube Q6H   latanoprost  1 drop Both Eyes QHS   multivitamin with minerals  1 tablet Per Tube Daily   mouth rinse  15 mL Mouth Rinse 10 times per day   pantoprazole (PROTONIX) IV  40 mg Intravenous Q12H   potassium chloride  40 mEq Oral Once   QUEtiapine  25 mg Per Tube Q24H   sertraline  25 mg Per Tube Daily   silver sulfADIAZINE   Topical BID   sodium chloride flush  3 mL Intravenous Q12H   tamsulosin  0.4 mg Oral Daily   thiamine  100 mg Per Tube Daily   Continuous Infusions:  feeding supplement (JEVITY 1.5 CAL/FIBER) Stopped (02/13/23 0113)   lacosamide (VIMPAT) IV 200 mg (02/13/23 1011)   levETIRAcetam 2,000 mg (02/13/23 1049)   PRN Meds:.acetaminophen **OR** acetaminophen, hydrALAZINE, midazolam, ondansetron **OR** ondansetron (ZOFRAN) IV, polyethylene glycol, sorbitol  Antibiotics  :    Anti-infectives (From admission, onward)    Start  Dose/Rate Route Frequency Ordered Stop   02/08/23 0600  cefTRIAXone (ROCEPHIN) 1 g in sodium chloride 0.9 % 100 mL IVPB        1 g 200 mL/hr over 30 Minutes Intravenous Every 24 hours 02/07/23 0600 02/09/23 0547   02/07/23 0700  cefTRIAXone (ROCEPHIN) 1 g in sodium chloride 0.9 % 100 mL IVPB        1 g 200 mL/hr over 30 Minutes Intravenous  Once 02/07/23 0601 02/07/23 0645          Objective:   Vitals:   02/13/23 0440 02/13/23 0500 02/13/23 0729 02/13/23 1139  BP: (!) 140/58  (!) 119/50 (!) 155/95  Pulse: 94  71   Resp:   20   Temp:   98.8 F (37.1 C) 98.9 F (37.2 C)  TempSrc:   Axillary Axillary  SpO2: 94%  100%   Weight:  49.9 kg      Wt Readings from Last 3 Encounters:  02/13/23 49.9 kg  10/29/22 48.3 kg  07/30/22 48.8 kg     Intake/Output Summary (Last 24 hours) at 02/13/2023 1157 Last data filed at 02/13/2023 0951 Gross per 24 hour  Intake 2051.17 ml  Output 2075 ml  Net -23.83 ml     Physical Exam  More awake today, eyes open, but she is confused, does not follow any commands or answer any questions appropriately, restless Symmetrical Chest wall movement, Good air movement bilaterally, CTAB RRR,No Gallops,Rubs or new Murmurs, No Parasternal Heave +ve B.Sounds, Abd Soft, No tenderness, No rebound - guarding or rigidity. No Cyanosis, Clubbing or edema, No new Rash or bruise        Data Review:    Recent Labs  Lab 02/07/23 0342 02/08/23 0213 02/09/23 0419 02/10/23 0444 02/11/23 0456 02/12/23 0529 02/12/23 1047 02/12/23 1621 02/12/23 2232 02/13/23 0634  WBC 8.1 8.2 7.2 7.1   < > 8.9 8.8 8.7 9.0 10.3  HGB 13.2 14.1 13.7 14.3   < > 8.1* 7.7* 7.9* 9.4* 11.3*  HCT 38.4 40.9 40.0 41.0   < > 23.9* 22.5* 23.1* 26.8* 33.4*  PLT 94* 106* 99* 105*   < > 120* 117* 130* 134* 137*  MCV 92.1 89.1 91.1 91.3   < > 93.4 95.3 92.4 89.9 86.5  MCH 31.7 30.7 31.2 31.8   < > 31.6 32.6 31.6 31.5 29.3  MCHC 34.4 34.5 34.3 34.9   < > 33.9 34.2 34.2 35.1 33.8  RDW 12.5 12.7 12.8 12.9   < > 13.5 13.5 13.5 14.7 16.2*  LYMPHSABS 2.1 2.8 2.7 2.3  --   --   --   --   --   --   MONOABS 0.7 0.7 0.6 0.7  --   --   --   --   --   --   EOSABS 0.2 0.3 0.3 0.1  --   --   --   --   --   --   BASOSABS 0.0 0.0 0.0 0.0  --   --   --   --   --   --    < > = values in this interval not displayed.    Recent Labs  Lab 02/07/23 0342 02/08/23 0213  02/09/23 0419 02/10/23 0444 02/11/23 0456 02/11/23 1623 02/12/23 0529 02/12/23 1047 02/12/23 1620 02/13/23 0634  NA 139 138 139 140 140  --  140  --   --  140  K 2.9* 3.7 3.7 3.5 3.7  --  3.7  --   --  3.6  CL 108 108 105 106 108  --  109  --   --  101  CO2 23 21* 23 23 22   --  24  --   --  24  ANIONGAP 8 9 11 11 10   --  7  --   --  15  GLUCOSE 119* 114* 101* 116* 125*  --  143*  --   --  117*  BUN 5* <5* 6* 6* 9  --  21  --   --  13  CREATININE 0.60 0.61 0.68 0.66 0.93  --  1.06*  --   --  0.76  AST  --   --   --   --   --   --   --  31  --   --   ALT  --   --   --   --   --   --   --  20  --   --   ALKPHOS  --   --   --   --   --   --   --  46  --   --   BILITOT  --   --   --   --   --   --   --  0.4  --   --   ALBUMIN  --   --   --   --   --   --   --  3.1*  --   --   AMMONIA  --   --   --  27  --   --   --   --   --   --   BNP 154.1* 89.9 20.9  --   --   --   --   --   --   --   MG 1.9 2.0 2.0  --  2.0 2.1 2.1  --  2.0 2.1  CALCIUM 7.7* 8.2* 8.5* 8.8* 8.5*  --  8.3*  --   --  8.5*     Lab Results  Component Value Date   CHOL 139 02/05/2023   HDL 64 02/05/2023   LDLCALC 69 02/05/2023   TRIG 31 02/05/2023   CHOLHDL 2.2 02/05/2023    Lab Results  Component Value Date   HGBA1C 5.6 02/05/2023      Radiology Reports Overnight EEG with video  Result Date: 02/13/2023 Charlsie Quest, MD     02/13/2023  8:38 AM Patient Name: Selena Branch MRN: [id number] Epilepsy Attending: Charlsie Quest Referring Physician/Provider: Milon Dikes, MD Duration: 02/13/2023 0050 to 0830  Patient history: 82 y.o. female with a past medical history of CLL, ITP, left parietal ICH in 2021 presenting with aphasia and generalized weakness. EEG to evaluate for seizure.  Level of alertness: awake. asleep  AEDs during EEG study: LEV, LCM  Technical aspects: This EEG study was done with scalp electrodes positioned according to the 10-20 International system of electrode placement. Electrical activity was  reviewed with band pass filter of 1-70Hz , sensitivity of 7 uV/mm, display speed of 13mm/sec with a 60Hz  notched filter applied as appropriate. EEG data were recorded continuously and digitally stored.  Video monitoring was available and reviewed as appropriate.  Description:   No clear posterior dominant rhythm was seen. EEG showed continuous generalized and lateralized left hemisphere 5-9Hz  theta-alpha activity admixed with intermittent 2-3hz  delta slowing. Frequent sharp waves were also noted arising from left hemisphere.  Hyperventilation and photic stimulation were not  performed.  ABNORMALITY - Sharp waves, left hemisphere - Continuous slow, generalized and lateralized left hemisphere  IMPRESSION: This study showed evidence of epileptogenicity and  cortical dysfunction arising from left hemisphere likely secondary to underlying structural abnormality. Additionally there is moderate diffuse encephalopathy. No definite seizures were seen.  Charlsie Quest   EEG adult  Result Date: 02/13/2023 Charlsie Quest, MD     02/13/2023  8:38 AM Patient Name: Selena Branch MRN: [id number] Epilepsy Attending: Charlsie Quest Referring Physician/Provider: Caryl Pina, MD Duration: 31.32 mins  Patient history: 82 y.o. female with a past medical history of CLL, ITP, left parietal ICH in 2021 presenting with aphasia and generalized weakness. EEG to evaluate for seizure.  Level of alertness: awake. asleep  AEDs during EEG study: LEV, LCM  Technical aspects: This EEG study was done with scalp electrodes positioned according to the 10-20 International system of electrode placement. Electrical activity was reviewed with band pass filter of 1-70Hz , sensitivity of 7 uV/mm, display speed of 51mm/sec with a 60Hz  notched filter applied as appropriate. EEG data were recorded continuously and digitally stored.  Video monitoring was available and reviewed as appropriate.  Description:   No clear posterior dominant rhythm was seen. EEG  showed continuous generalized and lateralized left hemisphere 3 to 6 Hz theta-delta slowing. Frequent sharp waves were also noted arising from left hemisphere.  Hyperventilation and photic stimulation were not performed.  Patient was noted to have upward gaze deviation with mouth opening and closing repetitively at 2309 on 02/12/2023. Concomitant eeg before, during and after the event didn't show any eeg change to suggest seizure ABNORMALITY - Sharp waves, left hemisphere - Continuous slow, generalized and lateralized left hemisphere  IMPRESSION: This study showed evidence of epileptogenicity and  cortical dysfunction arising from left hemisphere likely secondary to underlying structural abnormality. Additionally there is moderate diffuse encephalopathy. No definite seizures were seen. On 02/12/2023 at 2309, patient was noted to have upward gaze deviation with mouth opening and closing repetitively without concomitant eeg change. This was most likely not an epileptic event.  Charlsie Quest   CT ABDOMEN PELVIS WO CONTRAST  Addendum Date: 02/13/2023   ADDENDUM REPORT: 02/13/2023 07:34 ADDENDUM: Study discussed by telephone with Dr. Huey Bienenstock on 02/13/2023 at 0732 hours. Electronically Signed   By: Odessa Fleming M.D.   On: 02/13/2023 07:34   Result Date: 02/13/2023 CLINICAL DATA:  82 year old female with blood loss anemia. Query bleeding source. EXAM: CT ABDOMEN AND PELVIS WITHOUT CONTRAST TECHNIQUE: Multidetector CT imaging of the abdomen and pelvis was performed following the standard protocol without IV contrast. RADIATION DOSE REDUCTION: This exam was performed according to the departmental dose-optimization program which includes automated exposure control, adjustment of the mA and/or kV according to patient size and/or use of iterative reconstruction technique. COMPARISON:  CT Chest, Abdomen, and Pelvis 12/01/2020. FINDINGS: Lower chest: No cardiomegaly. Small layering pleural effusions with associated lower  lobe lung opacity most resembling atelectasis. Hepatobiliary: Small layering gallstones. Negative noncontrast liver. Pancreas: Negative. Spleen: Negative. Adrenals/Urinary Tract: Negative adrenal glands, noncontrast kidneys. Foley catheter decompresses the urinary bladder, which is bordered by some pelvic hematoma. See additional details below. Stomach/Bowel: Enteric feeding tube terminates in the 2nd portion of the duodenum right upper quadrant series 3, image 25. No free air. No dilated large or small bowel loops. Nonspecific fluid containing large bowel from the right colon to the redundant splenic flexure. Decompressed descending colon with extensive diverticulosis. Redundant but decompressed sigmoid colon. However, there is  a small volume of layering complex fluid density in both pericolic gutters, on the right tracking up toward the inferior margin of the liver. But this is a relatively small volume compared to pelvis findings below. Vascular/Lymphatic: Normal caliber abdominal aorta. Aortoiliac calcified atherosclerosis. Vascular patency is not evaluated in the absence of IV contrast. No lymphadenopathy identified. Reproductive: Chronic left ovarian cyst with simple fluid density has not significantly changed since CT pelvis 08/31/2019 (no follow-up imaging recommended). Uterus remains in place. Other: Nonspecific mild to moderate presacral stranding or hyperdense fluid,, and this is probably layering presacral hematoma. However, there is heterogeneous enlargement of the lower right rectus muscle, primarily between the deep rectus and the peritoneal cavity. This extends into the deep pelvis anterior space of Retzius, and encompasses 42 x 71 x 103 mm (AP by transverse by CC) for an estimated hematoma volume there of 154 mL. Smaller volume additional pelvic sidewall hematoma also identified on the right series 3, image 66. 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 Spine  degeneration. No acute or suspicious osseous lesion. IMPRESSION: 1. Positive for evidence of Pelvic Hematoma, which might have originated from Intramuscular hemorrhage of the Right Lower Rectus Muscle. Deep lower right rectus muscle sheath hematoma is estimated at 154 mL, with evidence of additional scattered pelvic sidewall, space of Retzius, and presacral hematoma. 2. Furthermore, there is a small volume of paracolic gutter layering Hemoperitoneum suspected. But no evidence of bowel obstruction or bowel perforation. But nonspecific fluid in the proximal large bowel. Query red blood per rectum. 3. Enteric feeding tube placed into the proximal duodenum. Foley catheter decompresses the urinary bladder. 4. Small layering pleural effusions with lung base atelectasis. 5. Cholelithiasis.  Aortic Atherosclerosis (ICD10-I70.0). Electronically Signed: By: Odessa Fleming M.D. On: 02/13/2023 07:29   DG CHEST PORT 1 VIEW  Result Date: 02/13/2023 CLINICAL DATA:  82 year old female with rectus muscle/pelvis hematoma. Anemia. Shortness of breath. EXAM: PORTABLE CHEST 1 VIEW COMPARISON:  CT Abdomen and Pelvis 0700 hours today. FINDINGS: Portable AP view at 0511 hours. Enteric feeding tube courses to the abdomen. Small volume layering pleural effusions and lung base atelectasis better demonstrated by CT. Otherwise Allowing for portable technique the lungs are clear. Mediastinal contours are within normal limits. Calcified aortic atherosclerosis. Visualized tracheal air column is within normal limits. Negative visible bowel gas pattern. IMPRESSION: 1. Small layering pleural effusions and lung base by CT. 2. No other acute cardiopulmonary abnormality. Electronically Signed   By: Odessa Fleming M.D.   On: 02/13/2023 07:34   DG Abd Portable 1V  Result Date: 02/11/2023 CLINICAL DATA:  Feeding tube placement EXAM: PORTABLE ABDOMEN - 1 VIEW COMPARISON:  None Available. FINDINGS: Enteric tube tip is at the level of the gastric antrum/proximal  duodenal. No dilated bowel loops are seen. No fractures are identified. IMPRESSION: Enteric tube tip is at the level of the gastric antrum/proximal duodenal. Electronically Signed   By: Darliss Cheney M.D.   On: 02/11/2023 15:33      Signature  -   Huey Bienenstock M.D on 02/13/2023 at 11:57 AM   -  To page go to [email or web address]

## 2023-02-13 NOTE — Progress Notes (Signed)
At 2303 according to the EEG tech, patient had episode of seizure like activities  lasted for about less than a minute  while EEG test is ongoing. Patient appears to be drowsy on assessment, Vital signs taken. O2 sat noted 88% room air, placed on 4 LPM of oxygen via nasal cannula and gone up to 94%. Patient had episode productive cough PRN suctioning done. Dr. Wilford Corner and Dr. Joneen Roach notified with new orders noted. Care ongoing.

## 2023-02-13 NOTE — Procedures (Addendum)
Patient Name: Selena Branch  MRN: [id number]  Epilepsy Attending: Charlsie Quest  Referring Physician/Provider: Caryl Pina, MD  Duration: 31.32 mins   Patient history: 82 y.o. female with a past medical history of CLL, ITP, left parietal ICH in 2021 presenting with aphasia and generalized weakness. EEG to evaluate for seizure.   Level of alertness: awake. asleep   AEDs during EEG study: LEV, LCM   Technical aspects: This EEG study was done with scalp electrodes positioned according to the 10-20 International system of electrode placement. Electrical activity was reviewed with band pass filter of 1-70Hz , sensitivity of 7 uV/mm, display speed of 30mm/sec with a 60Hz  notched filter applied as appropriate. EEG data were recorded continuously and digitally stored.  Video monitoring was available and reviewed as appropriate.   Description:   No clear posterior dominant rhythm was seen. EEG showed continuous generalized and lateralized left hemisphere 3 to 6 Hz theta-delta slowing. Frequent sharp waves were also noted arising from left hemisphere.  Hyperventilation and photic stimulation were not performed.    Patient was noted to have upward gaze deviation with mouth opening and closing repetitively at 2309 on 02/12/2023. Concomitant eeg before, during and after the event didn't show any eeg change to suggest seizure  ABNORMALITY - Sharp waves, left hemisphere - Continuous slow, generalized and lateralized left hemisphere   IMPRESSION: This study showed evidence of epileptogenicity and  cortical dysfunction arising from left hemisphere likely secondary to underlying structural abnormality. Additionally there is moderate diffuse encephalopathy. No definite seizures were seen.  On 02/12/2023 at 2309, patient was noted to have upward gaze deviation with mouth opening and closing repetitively without concomitant eeg change. This was most likely not an epileptic event.    Derrin Currey [name]

## 2023-02-13 NOTE — Progress Notes (Unsigned)
Marland Kitchen  HEMATOLOGY/ONCOLOGY INPATIENT PROGRESS NOTE  Date of Service: 02/13/2023  Inpatient Attending: .No att. providers found   SUBJECTIVE  ***    OBJECTIVE:  ***  PHYSICAL EXAMINATION: .There were no vitals filed for this visit. There were no vitals filed for this visit. .There is no height or weight on file to calculate BMI.  GENERAL:alert, in no acute distress and comfortable SKIN: skin color, texture, turgor are normal, no rashes or significant lesions EYES: normal, conjunctiva are pink and non-injected, sclera clear OROPHARYNX:no exudate, no erythema and lips, buccal mucosa, and tongue normal  NECK: supple, no JVD, thyroid normal size, non-tender, without nodularity LYMPH:  no palpable lymphadenopathy in the cervical, axillary or inguinal LUNGS: clear to auscultation with normal respiratory effort HEART: regular rate & rhythm,  no murmurs and no lower extremity edema ABDOMEN: abdomen soft, non-tender, normoactive bowel sounds  Musculoskeletal: no cyanosis of digits and no clubbing  PSYCH: alert & oriented x 3 with fluent speech NEURO: no focal motor/sensory deficits  MEDICAL HISTORY:  Past Medical History:  Diagnosis Date   Anxiety    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  SOCIAL HISTORY: Social History   Socioeconomic History   Marital status: Married    Spouse name: Not on file   Number of children: 1   Years of education: HS   Highest education level: Not on file  Occupational History   Occupation: Retired  Tobacco Use   Smoking status: Never   Smokeless tobacco: Never  Vaping Use   Vaping Use: Never used  Substance and Sexual Activity   Alcohol use: No    Alcohol/week: 0.0 standard drinks of alcohol   Drug use: No   Sexual activity: Yes    Birth control/protection: Post-menopausal  Other Topics Concern   Not on file   Social History Narrative   Drinks 2 cups of coffee   Social Determinants of Health   Financial Resource Strain: Not on file  Food Insecurity: Not on file  Transportation Needs: Not on file  Physical Activity: Not on file  Stress: Not on file  Social Connections: Not on file  Intimate Partner Violence: Not on file    FAMILY HISTORY: Family History  Problem Relation Age of Onset   Stroke Mother    Lung cancer Father    Leukemia Neg Hx    Breast cancer Neg Hx    Ovarian cancer Neg Hx    Colon cancer Neg Hx    Endometrial cancer Neg Hx    Prostate cancer Neg Hx    Pancreatic cancer Neg Hx     ALLERGIES:  is allergic to codeine, evista [raloxifene], actonel [risedronate], boniva [ibandronic acid], fosamax [alendronate], and prednisone.  MEDICATIONS:  Scheduled Meds: Continuous Infusions: PRN Meds:.  REVIEW OF SYSTEMS:    10 Point review of Systems was done is negative except as noted above.   LABORATORY DATA:  I have reviewed the data as listed  .    Latest Ref Rng & Units 02/13/2023    6:34 AM 02/12/2023   10:32 PM 02/12/2023    4:21 PM  CBC  WBC 4.0 - 10.5 K/uL 10.3  9.0  8.7   Hemoglobin 12.0 - 15.0 g/dL 16.1  9.4  7.9   Hematocrit 36.0 - 46.0 % 33.4  26.8  23.1   Platelets 150 -  400 K/uL 137  134  130     .    Latest Ref Rng & Units 02/13/2023    6:34 AM 02/12/2023   10:47 AM 02/12/2023    5:29 AM  CMP  Glucose 70 - 99 mg/dL 409   811   BUN 8 - 23 mg/dL 13   21   Creatinine 9.14 - 1.00 mg/dL 7.82   9.56   Sodium 213 - 145 mmol/L 140   140   Potassium 3.5 - 5.1 mmol/L 3.6   3.7   Chloride 98 - 111 mmol/L 101   109   CO2 22 - 32 mmol/L 24   24   Calcium 8.9 - 10.3 mg/dL 8.5   8.3   Total Protein 6.5 - 8.1 g/dL  5.0    Total Bilirubin 0.3 - 1.2 mg/dL  0.4    Alkaline Phos 38 - 126 U/L  46    AST 15 - 41 U/L  31    ALT 0 - 44 U/L  20       RADIOGRAPHIC STUDIES: I have personally reviewed the radiological images as listed and agreed with the findings in  the report. Overnight EEG with video  Result Date: 02/13/2023 Charlsie Quest, MD     02/13/2023  8:38 AM Patient Name: LELANIA DYKE MRN: [id number] Epilepsy Attending: Charlsie Quest Referring Physician/Provider: Milon Dikes, MD Duration: 02/13/2023 0050 to 0830  Patient history: 82 y.o. female with a past medical history of CLL, ITP, left parietal ICH in 2021 presenting with aphasia and generalized weakness. EEG to evaluate for seizure.  Level of alertness: awake. asleep  AEDs during EEG study: LEV, LCM  Technical aspects: This EEG study was done with scalp electrodes positioned according to the 10-20 International system of electrode placement. Electrical activity was reviewed with band pass filter of 1-70Hz , sensitivity of 7 uV/mm, display speed of 51mm/sec with a 60Hz  notched filter applied as appropriate. EEG data were recorded continuously and digitally stored.  Video monitoring was available and reviewed as appropriate.  Description:   No clear posterior dominant rhythm was seen. EEG showed continuous generalized and lateralized left hemisphere 5-9Hz  theta-alpha activity admixed with intermittent 2-3hz  delta slowing. Frequent sharp waves were also noted arising from left hemisphere.  Hyperventilation and photic stimulation were not performed.  ABNORMALITY - Sharp waves, left hemisphere - Continuous slow, generalized and lateralized left hemisphere  IMPRESSION: This study showed evidence of epileptogenicity and  cortical dysfunction arising from left hemisphere likely secondary to underlying structural abnormality. Additionally there is moderate diffuse encephalopathy. No definite seizures were seen.  Charlsie Quest   EEG adult  Result Date: 02/13/2023 Charlsie Quest, MD     02/13/2023  8:38 AM Patient Name: TANITRA KOEHLER MRN: [id number] Epilepsy Attending: Charlsie Quest Referring Physician/Provider: Caryl Pina, MD Duration: 31.32 mins  Patient history: 82 y.o. female with a past medical  history of CLL, ITP, left parietal ICH in 2021 presenting with aphasia and generalized weakness. EEG to evaluate for seizure.  Level of alertness: awake. asleep  AEDs during EEG study: LEV, LCM  Technical aspects: This EEG study was done with scalp electrodes positioned according to the 10-20 International system of electrode placement. Electrical activity was reviewed with band pass filter of 1-70Hz , sensitivity of 7 uV/mm, display speed of 46mm/sec with a 60Hz  notched filter applied as appropriate. EEG data were recorded continuously and digitally stored.  Video monitoring was available and reviewed as appropriate.  Description:   No clear posterior dominant rhythm was seen. EEG showed continuous generalized and lateralized left hemisphere 3 to 6 Hz theta-delta slowing. Frequent sharp waves were also noted arising from left hemisphere.  Hyperventilation and photic stimulation were not performed.  Patient was noted to have upward gaze deviation with mouth opening and closing repetitively at 2309 on 02/12/2023. Concomitant eeg before, during and after the event didn't show any eeg change to suggest seizure ABNORMALITY - Sharp waves, left hemisphere - Continuous slow, generalized and lateralized left hemisphere  IMPRESSION: This study showed evidence of epileptogenicity and  cortical dysfunction arising from left hemisphere likely secondary to underlying structural abnormality. Additionally there is moderate diffuse encephalopathy. No definite seizures were seen. On 02/12/2023 at 2309, patient was noted to have upward gaze deviation with mouth opening and closing repetitively without concomitant eeg change. This was most likely not an epileptic event.  Charlsie Quest   CT ABDOMEN PELVIS WO CONTRAST  Addendum Date: 02/13/2023   ADDENDUM REPORT: 02/13/2023 07:34 ADDENDUM: Study discussed by telephone with Dr. Huey Bienenstock on 02/13/2023 at 0732 hours. Electronically Signed   By: Odessa Fleming M.D.   On: 02/13/2023 07:34    Result Date: 02/13/2023 CLINICAL DATA:  82 year old female with blood loss anemia. Query bleeding source. EXAM: CT ABDOMEN AND PELVIS WITHOUT CONTRAST TECHNIQUE: Multidetector CT imaging of the abdomen and pelvis was performed following the standard protocol without IV contrast. RADIATION DOSE REDUCTION: This exam was performed according to the departmental dose-optimization program which includes automated exposure control, adjustment of the mA and/or kV according to patient size and/or use of iterative reconstruction technique. COMPARISON:  CT Chest, Abdomen, and Pelvis 12/01/2020. FINDINGS: Lower chest: No cardiomegaly. Small layering pleural effusions with associated lower lobe lung opacity most resembling atelectasis. Hepatobiliary: Small layering gallstones. Negative noncontrast liver. Pancreas: Negative. Spleen: Negative. Adrenals/Urinary Tract: Negative adrenal glands, noncontrast kidneys. Foley catheter decompresses the urinary bladder, which is bordered by some pelvic hematoma. See additional details below. Stomach/Bowel: Enteric feeding tube terminates in the 2nd portion of the duodenum right upper quadrant series 3, image 25. No free air. No dilated large or small bowel loops. Nonspecific fluid containing large bowel from the right colon to the redundant splenic flexure. Decompressed descending colon with extensive diverticulosis. Redundant but decompressed sigmoid colon. However, there is a small volume of layering complex fluid density in both pericolic gutters, on the right tracking up toward the inferior margin of the liver. But this is a relatively small volume compared to pelvis findings below. Vascular/Lymphatic: Normal caliber abdominal aorta. Aortoiliac calcified atherosclerosis. Vascular patency is not evaluated in the absence of IV contrast. No lymphadenopathy identified. Reproductive: Chronic left ovarian cyst with simple fluid density has not significantly changed since CT pelvis  08/31/2019 (no follow-up imaging recommended). Uterus remains in place. Other: Nonspecific mild to moderate presacral stranding or hyperdense fluid,, and this is probably layering presacral hematoma. However, there is heterogeneous enlargement of the lower right rectus muscle, primarily between the deep rectus and the peritoneal cavity. This extends into the deep pelvis anterior space of Retzius, and encompasses 42 x 71 x 103 mm (AP by transverse by CC) for an estimated hematoma volume there of 154 mL. Smaller volume additional pelvic sidewall hematoma also identified on the right series 3, image 66. 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 Spine degeneration. No acute or suspicious osseous lesion. IMPRESSION: 1. Positive for evidence of Pelvic Hematoma, which might have originated from Intramuscular hemorrhage of the Right  Lower Rectus Muscle. Deep lower right rectus muscle sheath hematoma is estimated at 154 mL, with evidence of additional scattered pelvic sidewall, space of Retzius, and presacral hematoma. 2. Furthermore, there is a small volume of paracolic gutter layering Hemoperitoneum suspected. But no evidence of bowel obstruction or bowel perforation. But nonspecific fluid in the proximal large bowel. Query red blood per rectum. 3. Enteric feeding tube placed into the proximal duodenum. Foley catheter decompresses the urinary bladder. 4. Small layering pleural effusions with lung base atelectasis. 5. Cholelithiasis.  Aortic Atherosclerosis (ICD10-I70.0). Electronically Signed: By: Odessa Fleming M.D. On: 02/13/2023 07:29   DG CHEST PORT 1 VIEW  Result Date: 02/13/2023 CLINICAL DATA:  82 year old female with rectus muscle/pelvis hematoma. Anemia. Shortness of breath. EXAM: PORTABLE CHEST 1 VIEW COMPARISON:  CT Abdomen and Pelvis 0700 hours today. FINDINGS: Portable AP view at 0511 hours. Enteric feeding tube courses to the abdomen. Small volume layering pleural effusions  and lung base atelectasis better demonstrated by CT. Otherwise Allowing for portable technique the lungs are clear. Mediastinal contours are within normal limits. Calcified aortic atherosclerosis. Visualized tracheal air column is within normal limits. Negative visible bowel gas pattern. IMPRESSION: 1. Small layering pleural effusions and lung base by CT. 2. No other acute cardiopulmonary abnormality. Electronically Signed   By: Odessa Fleming M.D.   On: 02/13/2023 07:34   DG Abd Portable 1V  Result Date: 02/11/2023 CLINICAL DATA:  Feeding tube placement EXAM: PORTABLE ABDOMEN - 1 VIEW COMPARISON:  None Available. FINDINGS: Enteric tube tip is at the level of the gastric antrum/proximal duodenal. No dilated bowel loops are seen. No fractures are identified. IMPRESSION: Enteric tube tip is at the level of the gastric antrum/proximal duodenal. Electronically Signed   By: Darliss Cheney M.D.   On: 02/11/2023 15:33   Overnight EEG with video  Result Date: 02/05/2023 Charlsie Quest, MD     02/07/2023  4:52 AM Patient Name: BLANCA SINQUEFIELD MRN: [id number] Epilepsy Attending: Charlsie Quest Referring Physician/Provider: Marjorie Smolder, NP Duration: 02/04/2023 1629 02/05/2023 1629 Patient history: 82 y.o. female with a past medical history of CLL, ITP, left parietal ICH in 2021 presenting with aphasia and generalized weakness. EEG to evaluate for seizure. Level of alertness: lethargic AEDs during EEG study: LEV Technical aspects: This EEG study was done with scalp electrodes positioned according to the 10-20 International system of electrode placement. Electrical activity was reviewed with band pass filter of 1-70Hz , sensitivity of 7 uV/mm, display speed of 82mm/sec with a 60Hz  notched filter applied as appropriate. EEG data were recorded continuously and digitally stored.  Video monitoring was available and reviewed as appropriate. Description: At the beginning of study, EEG showed lateralized periodic discharges in  left hemisphere, maximal left parietal region at 2.5-3Hz  admixed with 3-5hz  theta-delta slowing and overriding 13-15hz  beta activity. Clinically patient was aphasic per neurologist. 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 Patient was loaded with Keppra  on 02/04/2023 at 1716 and subsequently status epilepticus resolved. After around 1830, EEG showed continuous generalized and lateralized left hemisphere 3 to 6 Hz theta-delta slowing. Lateralized periodic discharges at 0.5-1hz  were also noted arising from left hemisphere. Hyperventilation and photic stimulation were not performed.   EEG was difficult to interpret between 02/04/2023 1946 to 02/05/2023 1010 due to significant electrode artifact. 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 Clinically, per neurologist patient was aphasic. Patient was loaded with keppra  on 02/04/2023 at 1716 and subsequently status epilepticus resolved. EEG was then suggestive of epileptogenicity and cortical dysfunction arising from left hemisphere likely secondary to underlying structural abnormality and increased risk of seizure recurrence. Lastly there is moderate diffuse encephalopathy. Charlsie Quest   DG Chest Port 1 View  Result Date: 02/04/2023 CLINICAL DATA:  Altered mental status EXAM: PORTABLE CHEST 1 VIEW COMPARISON:  CT 12/01/2020 FINDINGS: Low lung volumes. Patchy retrocardiac opacity. No pleural effusion. Normal cardiac size. Aortic atherosclerosis IMPRESSION: Low lung volumes with patchy retrocardiac opacity, atelectasis versus pneumonia. Electronically Signed   By: Jasmine Pang M.D.   On: 02/04/2023 17:22   MR BRAIN WO  CONTRAST  Result Date: 02/04/2023 CLINICAL DATA:  Neuro deficit, acute, stroke suspected. EXAM: MRI HEAD WITHOUT CONTRAST TECHNIQUE: Multiplanar, multiecho pulse sequences of the brain and surrounding structures were obtained without intravenous contrast. COMPARISON:  Head CT 02/04/2023 and older.  MRI brain 01/14/2016. FINDINGS: Brain: No acute infarct or hemorrhage. Sequela of prior hemorrhage in the left superior temporal gyrus. Old microhemorrhage in the posterior right temporal lobe. No hydrocephalus or extra-axial collection. No mass or midline shift. Vascular: Normal flow voids. Skull and upper cervical spine: Normal marrow signal. Sinuses/Orbits: Unremarkable. Other: None. IMPRESSION: No acute intracranial process. Electronically Signed   By: Orvan Falconer M.D.   On: 02/04/2023 16:10   CT ANGIO HEAD NECK W WO CM (CODE STROKE)  Result Date: 02/04/2023 CLINICAL DATA:  Neuro deficit, acute, stroke suspected right sided weakness, aphasia. EXAM: CT ANGIOGRAPHY HEAD AND NECK WITH AND WITHOUT CONTRAST TECHNIQUE: Multidetector CT imaging of the head and neck was performed using the standard protocol during bolus administration of intravenous contrast. Multiplanar CT image reconstructions and MIPs were obtained to evaluate the vascular anatomy. Carotid stenosis measurements (when applicable) are obtained utilizing NASCET criteria, using the distal internal carotid diameter as the denominator. RADIATION DOSE REDUCTION: This exam was performed according to the departmental dose-optimization program which includes automated exposure control, adjustment of the mA and/or kV according to patient size and/or use of iterative reconstruction technique. CONTRAST:  50mL OMNIPAQUE IOHEXOL 350 MG/ML SOLN COMPARISON:  Head CT 02/04/2023.  MRI brain 01/14/2016. FINDINGS: CTA NECK FINDINGS Aortic arch: Three-vessel arch configuration. Atherosclerotic calcifications of the aortic arch and arch vessel origins. Arch vessel  origins are patent. Right carotid system: No evidence of dissection, stenosis (50% or greater), or occlusion. Left carotid system: No evidence of dissection, stenosis (50% or greater), or occlusion. Vertebral arteries: Codominant. No evidence of dissection, stenosis (50% or greater), or occlusion. 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 Other neck: Unremarkable. Upper chest: Unremarkable. Review of the MIP images confirms the above findings CTA HEAD FINDINGS Anterior circulation: Intracranial ICAs are patent without stenosis or aneurysm. The proximal ACAs and MCAs are patent without stenosis or aneurysm. Distal branches are symmetric. Posterior circulation: Normal basilar artery. The SCAs, AICAs and PICAs are patent proximally. The PCAs are patent proximally without stenosis or aneurysm. Distal branches are symmetric. Venous sinuses: Patent. 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 Review of the MIP images confirms the above findings IMPRESSION: No large vessel occlusion, hemodynamically significant stenosis or aneurysm of the head or neck vessels. Aortic Atherosclerosis (ICD10-I70.0). Electronically Signed   By: Elwyn Reach.D.  On: 02/04/2023 15:42   CT HEAD CODE STROKE WO CONTRAST  Result Date: 02/04/2023 CLINICAL DATA:  Code stroke. Neuro deficit, acute, stroke suspected no lateralizing information provided. EXAM: CT HEAD WITHOUT CONTRAST TECHNIQUE: Contiguous axial images were obtained from the base of the skull through the vertex without intravenous contrast. RADIATION DOSE REDUCTION: This exam was performed according to the departmental dose-optimization program which includes automated exposure control, adjustment of the mA and/or kV according to patient size and/or use of iterative reconstruction technique. COMPARISON:  CT Head 11/14/19 FINDINGS: Brain: No evidence of acute  infarction, hemorrhage, hydrocephalus, extra-axial collection or mass lesion/mass effect. Sequela of moderate chronic microvascular ischemic change. Unchanged small cystic lesion in the left temporal lobe (series 2, image 17), previously favored to represent a chronic evolving hemorrhage. Vascular: No hyperdense vessel or unexpected calcification. Skull: Normal. Negative for fracture or focal lesion. Sinuses/Orbits: No middle ear or mastoid effusion. Paranasal sinuses are clear. Orbits are notable for bilateral lens replacement, otherwise unremarkable. Other: None. ASPECTS [facility] Stroke Program Early CT Score):: 10 IMPRESSION: No acute hemorrhage or CT evidence of an acute cortical infarct. Findings were paged to Dr. Pearlean Brownie on 02/04/23 at 3:17 PM via [facility] paging system. Electronically Signed   By: Lorenza Cambridge M.D.   On: 02/04/2023 15:17    ASSESSMENT & PLAN:  ***  I spent {CHL ONC TIME VISIT - [id number]} counseling the patient face to face. The total time spent in the appointment was {CHL ONC TIME VISIT - [id number]} and more than 50% was on counseling and direct patient cares.    Wyvonnia Lora MD MS AAHIVMS [facility] Hematology/Oncology Physician [facility]  (Office):       [phone] (Work cell):  [phone] (Fax):           [phone]  02/13/2023 12:44 PM

## 2023-02-13 NOTE — Progress Notes (Signed)
LTM maint complete - no skin breakdown under: F7,F8,A2,

## 2023-02-13 NOTE — Plan of Care (Addendum)
Notified by EEG tech of possible seizure activity on EEG.  EEG reveals ongoing lateralized parotic discharges predominantly from the left hemisphere and continuous generalized slowing.  Hard to tell whether some of these are epileptogenic or not at this time. Await formal review - start LTM She is also more lethargic and drowsy according to the RN. Will hold off on adding more AEDs unless there is confirmation of more seizures since she is a DNR and we do not want to get to a point where her respiratory status is depressed to require intubation.   -- Milon Dikes, MD Neurologist [facility] Pager: [phone]

## 2023-02-13 NOTE — Progress Notes (Signed)
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  Head CT and brain MRI were negative for acute abnormalities.  She has no previous history of seizures, no family history of seizures, no history of brain surgery, no history of meningitis or encephalitis. At baseline, she is alert and oriented, able to do all of her ADLs and cares for her husband.   S:// No family at the bedside. Patient is in bilateral wrist restraints. She is sleepy this morning, but arouses easily. She is oriented to self and birth date but is unable to answer other orientation questions correctly.  Routine EEG done last night with sharp waves, left hemisphere, continuous slow, generalized and lateralized to the left hemisphere. On 02/12/2023 at 2309, patient was noted to have upward gaze deviation with mouth opening and closing repetitively without concomitant eeg change. This was most likely not an epileptic event. She was placed on LTM.  LTM read with harp waves, left hemisphere, Continuous slow, generalized and lateralized left hemisphere, No definite seizures were seen.     O:// Current vital signs: BP (!) 119/50 (BP Location: Left Arm)   Pulse 71   Temp 98.8 F (37.1 C) (Axillary)   Resp 20   Wt 49.9 kg   SpO2 100%   BMI 22.99 kg/m  Vital signs in last 24 hours: Temp:  [98 F (36.7 C)-99.3 F (37.4 C)] 98.8 F (37.1 C) (06/02 0729) Pulse Rate:  [70-96] 71 (06/02 0729) Resp:  [17-20] 20 (06/02 0729) BP: (91-150)/(45-86) 119/50 (06/02 0729) SpO2:  [94 %-100 %] 100 % (06/02 0729) Weight:  [49.9 kg] 49.9 kg (06/02 0500)  GENERAL: elderly female in NAD. Lying comfortably in bed.  HEENT: - Normocephalic and atraumatic, dry mm LUNGS - Clear to auscultation bilaterally with no wheezes CV - S1S2 RRR, no m/r/g, equal pulses bilaterally. ABDOMEN - Soft,  nontender, nondistended with normoactive BS Ext: warm, well perfused, intact peripheral pulses, no edema  NEURO:  Mental Status: She is oriented to self, and birth date unable to answer other orientation questions correctly. Can follow simple commands. Poor attention. Speech is clear with some perseveration. She is agitated with repetitive opening and closing movements of her jaw. Staring straight ahead with flattened affect overlaying components of agitation/anxiety. Motor: Moving all 4 extremities spontaneously. Has difficulty following commands consistently and/or without repeated requests. Tone is normal and bulk is normal Sensation- Withdraws in all 4 extremities  Coordination: Unable to assess due to inability to follow detailed commands in the context of her AMS Gait- Deferred   Medications  Current Facility-Administered Medications:    acetaminophen (TYLENOL) tablet 650 mg, 650 mg, Per Tube, Q4H PRN **OR** acetaminophen (TYLENOL) suppository 650 mg, 650 mg, Rectal, Q4H PRN, Elgergawy, Leana Roe, MD   amLODipine (NORVASC) tablet 10 mg, 10 mg, Per Tube, QHS, Elgergawy, Leana Roe, MD, 10 mg at 02/12/23 2146   atorvastatin (LIPITOR) tablet 40 mg, 40 mg, Per Tube, Daily, Elgergawy, Leana Roe, MD, 40 mg at 02/13/23 0947   carvedilol (COREG) tablet 3.125 mg, 3.125 mg, Per Tube, BID WC, Elgergawy, Leana Roe, MD, 3.125 mg at 02/13/23 0947   chlorhexidine gluconate (MEDLINE KIT) (PERIDEX) 0.12 % solution 15 mL, 15 mL, Mouth Rinse, BID, Rodolph Bong, MD, 15 mL at 02/13/23 0949   Chlorhexidine Gluconate Cloth 2 % PADS 6 each, 6 each, Topical, Daily, Thedore Mins,  Stanford Scotland, MD, 6 each at 02/13/23 0948   dorzolamide-timolol (COSOPT) 2-0.5 % ophthalmic solution 1 drop, 1 drop, Both Eyes, Daily, Rodolph Bong, MD, 1 drop at 02/13/23 1012   eltrombopag (PROMACTA) tablet 25 mg, 25 mg, Oral, Q M,W,F, Pham, Minh Q, RPH-CPP, 25 mg at 02/12/23 1610   feeding supplement (JEVITY 1.5 CAL/FIBER) liquid 1,000  mL, 1,000 mL, Per Tube, Continuous, Elgergawy, Leana Roe, MD, Held at 02/13/23 0113   feeding supplement (PROSource TF20) liquid 60 mL, 60 mL, Per Tube, Daily, Elgergawy, Leana Roe, MD, 60 mL at 02/13/23 0950   free water 130 mL, 130 mL, Per Tube, Q4H, Elgergawy, Leana Roe, MD, 130 mL at 02/12/23 2017   furosemide (LASIX) injection 20 mg, 20 mg, Intravenous, Once, Elgergawy, Leana Roe, MD   hydrALAZINE (APRESOLINE) injection 5 mg, 5 mg, Intravenous, Q6H PRN, Rodolph Bong, MD   hydrALAZINE (APRESOLINE) tablet 25 mg, 25 mg, Per Tube, Q6H, Elgergawy, Leana Roe, MD, 25 mg at 02/12/23 1742   lacosamide (VIMPAT) 200 mg in sodium chloride 0.9 % 25 mL IVPB, 200 mg, Intravenous, Q12H, Milon Dikes, MD, Last Rate: 90 mL/hr at 02/13/23 1011, 200 mg at 02/13/23 1011   latanoprost (XALATAN) 0.005 % ophthalmic solution 1 drop, 1 drop, Both Eyes, QHS, Rodolph Bong, MD, 1 drop at 02/12/23 2151   levETIRAcetam (KEPPRA) 2,000 mg in sodium chloride 0.9 % 250 mL IVPB, 2,000 mg, Intravenous, Q12H, Charlsie Quest, MD, Last Rate: 900 mL/hr at 02/12/23 2239, 2,000 mg at 02/12/23 2239   midazolam (VERSED) injection 1 mg, 1 mg, Intravenous, Q4H PRN, Charlsie Quest, MD   multivitamin with minerals tablet 1 tablet, 1 tablet, Per Tube, Daily, Elgergawy, Leana Roe, MD, 1 tablet at 02/13/23 0947   ondansetron (ZOFRAN) tablet 4 mg, 4 mg, Per Tube, Q6H PRN **OR** ondansetron (ZOFRAN) injection 4 mg, 4 mg, Intravenous, Q6H PRN, Elgergawy, Leana Roe, MD   Oral care mouth rinse, 15 mL, Mouth Rinse, 10 times per day, Rodolph Bong, MD, 15 mL at 02/13/23 0950   pantoprazole (PROTONIX) injection 40 mg, 40 mg, Intravenous, Q12H, Elgergawy, Leana Roe, MD, 40 mg at 02/13/23 0947   polyethylene glycol (MIRALAX / GLYCOLAX) packet 17 g, 17 g, Per Tube, Daily PRN, Elgergawy, Leana Roe, MD   potassium chloride (KLOR-CON) packet 40 mEq, 40 mEq, Oral, Once, Leroy Sea, MD   QUEtiapine (SEROQUEL) tablet 25 mg, 25 mg, Per Tube,  Q24H, Elgergawy, Leana Roe, MD, 25 mg at 02/12/23 1742   sertraline (ZOLOFT) tablet 25 mg, 25 mg, Per Tube, Daily, Elgergawy, Leana Roe, MD, 25 mg at 02/13/23 0947   silver sulfADIAZINE (SILVADENE) 1 % cream, , Topical, BID, Elgergawy, Leana Roe, MD, Given at 02/13/23 0948   sodium chloride flush (NS) 0.9 % injection 3 mL, 3 mL, Intravenous, Q12H, Rodolph Bong, MD, 3 mL at 02/13/23 0951   sorbitol 70 % solution 30 mL, 30 mL, Per Tube, Daily PRN, Elgergawy, Leana Roe, MD   tamsulosin (FLOMAX) capsule 0.4 mg, 0.4 mg, Oral, Daily, Susa Raring K, MD, 0.4 mg at 02/13/23 0947   thiamine (VITAMIN B1) tablet 100 mg, 100 mg, Per Tube, Daily, Elgergawy, Leana Roe, MD, 100 mg at 02/13/23 0947  Labs CBC    Component Value Date/Time   WBC 10.3 02/13/2023 0634   RBC 3.86 (L) 02/13/2023 0634   HGB 11.3 (L) 02/13/2023 0634   HGB 14.3 02/01/2023 0923   HCT 33.4 (L) 02/13/2023 0634   PLT 137 (  L) 02/13/2023 0634   PLT 109 (L) 02/01/2023 0923   MCV 86.5 02/13/2023 0634   MCH 29.3 02/13/2023 0634   MCHC 33.8 02/13/2023 0634   RDW 16.2 (H) 02/13/2023 0634   LYMPHSABS 2.3 02/10/2023 0444   MONOABS 0.7 02/10/2023 0444   EOSABS 0.1 02/10/2023 0444   BASOSABS 0.0 02/10/2023 0444    CMP     Component Value Date/Time   NA 140 02/13/2023 0634   K 3.6 02/13/2023 0634   CL 101 02/13/2023 0634   CO2 24 02/13/2023 0634   GLUCOSE 117 (H) 02/13/2023 0634   BUN 13 02/13/2023 0634   CREATININE 0.76 02/13/2023 0634   CREATININE 0.81 02/01/2023 0923   CALCIUM 8.5 (L) 02/13/2023 0634   PROT 5.0 (L) 02/12/2023 1047   ALBUMIN 3.1 (L) 02/12/2023 1047   AST 31 02/12/2023 1047   AST 21 02/01/2023 0923   ALT 20 02/12/2023 1047   ALT 9 02/01/2023 0923   ALKPHOS 46 02/12/2023 1047   BILITOT 0.4 02/12/2023 1047   BILITOT 0.5 02/01/2023 0923   GFRNONAA >60 02/13/2023 0634   GFRNONAA >60 02/01/2023 0923   GFRAA >60 06/06/2020 1000     Lipid Panel     Component Value Date/Time   CHOL 139 02/05/2023 0621    TRIG 31 02/05/2023 0621   HDL 64 02/05/2023 0621   CHOLHDL 2.2 02/05/2023 0621   VLDL 6 02/05/2023 0621   LDLCALC 69 02/05/2023 0621     Imaging I have reviewed images in epic and the results pertinent to this consultation are:  CT-scan of the brain- No acute abnormalities   CTA head and neck: No LVO or hemodynamically significant stenosis   MRI examination of the brain-No acute infarct, no acute abnormalities    Routine EEG 6/1-This study showed evidence of epileptogenicity and  cortical dysfunction arising from left hemisphere likely secondary to underlying structural abnormality. Additionally there is moderate diffuse encephalopathy. No definite seizures were seen. At 2309, patient was noted to have upward gaze deviation with mouth opening and closing repetitively without concomitant EEG change. This was most likely not an epileptic event.    LTM 6/1-6/2 -This study showed evidence of epileptogenicity and  cortical dysfunction arising from left hemisphere likely secondary to underlying structural abnormality. Additionally there is moderate diffuse encephalopathy. No definite seizures were seen.   Assessment: 82 year old patient with history of CLL, ITP and ICH who presented on 5/24 with aphasia and generalized weakness.  Head CT and MRI were negative for acute abnormalities, but EEG revealed the patient to be in electrographic status epilepticus, which terminated after Keppra load.   - On exam today she is sleepy but easily arousable to an agitated state with a component of anxiety. Poor attention, oriented to self, follows simple commands but unable to demonstrate comprehension of complex commands. Moving all extremities equally. Repetitive opening and closing movements of jaw are noted which are non-epileptic based on accompanying EEG tracings.  - LTM 6/1-6/2 -This study showed evidence of epileptogenicity and  cortical dysfunction arising from left hemisphere likely secondary to  underlying structural abnormality. Additionally there is moderate diffuse encephalopathy. No definite seizures were seen. - Ammonia level 27. LFTs normal.   Impression:  - New onset seizures in a patient with prior ICH, the latter most likely serving as a seizure focus - Focal electrographic status epilepticus has resolved.  However, EEG continues to show lateralized periodic discharges and patient continues to have expressive aphasia after more than 72 hours.  At times, LPDs can be ictal.  Another differential would be prolonged postictal aphasia   Recommendations: - Continue seizure precautions and delirium precautions. No driving after discharge.  - Clonazepam discontinued due to increased lethargy  - Continue Keppra 2000 mg twice daily and Vimpat 200 mg twice daily - Continue Seroquel 25 mg in the evening - Adding Depakote to her regimen with a 20 mg/kg IV load followed by 5 mg/kg IV TID.  - As needed IV Versed for clinical seizures lasting more than 2 minutes - Patient is DNR so need to be careful about not over sedating her  - Will need outpatient Neurology follow up after discharge  - Continue LTM for one more day (restarted on 6/1)   Gevena Mart DNP, ACNPC-AG  [facility]

## 2023-02-13 NOTE — Procedures (Signed)
Patient Name: Selena Branch  MRN: [id number]  Epilepsy Attending: Charlsie Quest  Referring Physician/Provider: Milon Dikes, MD  Duration: 02/13/2023 0050 to 02/14/2023 0050   Patient history: 82 y.o. female with a past medical history of CLL, ITP, left parietal ICH in 2021 presenting with aphasia and generalized weakness. EEG to evaluate for seizure.   Level of alertness: awake. asleep   AEDs during EEG study: LEV, LCM   Technical aspects: This EEG study was done with scalp electrodes positioned according to the 10-20 International system of electrode placement. Electrical activity was reviewed with band pass filter of 1-70Hz , sensitivity of 7 uV/mm, display speed of 40mm/sec with a 60Hz  notched filter applied as appropriate. EEG data were recorded continuously and digitally stored.  Video monitoring was available and reviewed as appropriate.   Description:  No clear posterior dominant rhythm was seen. Sleep was characterized by sleep spindles (12 to 14 Hz), maximal frontocentral region.  EEG showed continuous generalized and lateralized left hemisphere 5-9Hz  theta-alpha activity admixed with intermittent 2-3hz  delta slowing. Sharp waves were also noted arising from left hemisphere.  Hyperventilation and photic stimulation were not performed.    ABNORMALITY - Sharp waves, left hemisphere - Continuous slow, generalized and lateralized left hemisphere   IMPRESSION: This study showed evidence of epileptogenicity and  cortical dysfunction arising from left hemisphere likely secondary to underlying structural abnormality. Additionally there is moderate diffuse encephalopathy. No definite seizures were seen.   Tashay Bozich [name]

## 2023-02-13 NOTE — Progress Notes (Signed)
Back on the floor after CT. Patient shows no signs of acute distress. Bedside report completed.

## 2023-02-13 NOTE — Progress Notes (Signed)
Off the floor for CT 

## 2023-02-14 ENCOUNTER — Inpatient Hospital Stay (HOSPITAL_COMMUNITY): Payer: PPO

## 2023-02-14 DIAGNOSIS — S301XXA Contusion of abdominal wall, initial encounter: Secondary | ICD-10-CM

## 2023-02-14 DIAGNOSIS — D649 Anemia, unspecified: Secondary | ICD-10-CM | POA: Diagnosis not present

## 2023-02-14 DIAGNOSIS — S301XXS Contusion of abdominal wall, sequela: Secondary | ICD-10-CM | POA: Diagnosis not present

## 2023-02-14 DIAGNOSIS — R569 Unspecified convulsions: Secondary | ICD-10-CM | POA: Diagnosis not present

## 2023-02-14 DIAGNOSIS — C9110 Chronic lymphocytic leukemia of B-cell type not having achieved remission: Secondary | ICD-10-CM | POA: Diagnosis not present

## 2023-02-14 LAB — GLUCOSE, CAPILLARY
Glucose-Capillary: 161 mg/dL — ABNORMAL HIGH (ref 70–99)
Glucose-Capillary: 137 mg/dL — ABNORMAL HIGH (ref 70–99)
Glucose-Capillary: 94 mg/dL (ref 70–99)

## 2023-02-14 LAB — BASIC METABOLIC PANEL
Anion gap: 9 (ref 5–15)
BUN: 17 mg/dL (ref 8–23)
CO2: 23 mmol/L (ref 22–32)
Calcium: 8.1 mg/dL — ABNORMAL LOW (ref 8.9–10.3)
Chloride: 107 mmol/L (ref 98–111)
Creatinine, Ser: 0.66 mg/dL (ref 0.44–1.00)
GFR, Estimated: >60 mL/min (ref >60)
Glucose, Bld: 121 mg/dL — ABNORMAL HIGH (ref 70–99)
Potassium: 3.6 mmol/L (ref 3.5–5.1)
Sodium: 139 mmol/L (ref 135–145)

## 2023-02-14 LAB — TYPE AND SCREEN
Unit division: 0
Unit division: 0

## 2023-02-14 LAB — CBC
HCT: 32.8 % — ABNORMAL LOW (ref 36.0–46.0)
HCT: 35.9 % — ABNORMAL LOW (ref 36.0–46.0)
Hemoglobin: 11.4 g/dL — ABNORMAL LOW (ref 12.0–15.0)
Hemoglobin: 12.3 g/dL (ref 12.0–15.0)
MCH: 30.6 pg (ref 26.0–34.0)
MCH: 30.2 pg (ref 26.0–34.0)
MCHC: 34.8 g/dL (ref 30.0–36.0)
MCHC: 34.3 g/dL (ref 30.0–36.0)
MCV: 88.2 fL (ref 80.0–100.0)
MCV: 88.2 fL (ref 80.0–100.0)
Platelets: 165 10*3/uL (ref 150–400)
Platelets: 203 10*3/uL (ref 150–400)
RBC: 3.72 MIL/uL — ABNORMAL LOW (ref 3.87–5.11)
RBC: 4.07 MIL/uL (ref 3.87–5.11)
RDW: 16.4 % — ABNORMAL HIGH (ref 11.5–15.5)
RDW: 16.2 % — ABNORMAL HIGH (ref 11.5–15.5)
WBC: 8.8 10*3/uL (ref 4.0–10.5)
WBC: 10.5 10*3/uL (ref 4.0–10.5)
nRBC: 0 % (ref 0.0–0.2)
nRBC: 0 % (ref 0.0–0.2)

## 2023-02-14 LAB — BPAM RBC
Blood Product Expiration Date: 202406262359
Blood Product Expiration Date: 202406292359
ISSUE DATE / TIME: 202406011732
Unit Type and Rh: 5100

## 2023-02-14 LAB — MAGNESIUM: Magnesium: 2.1 mg/dL (ref 1.7–2.4)

## 2023-02-14 LAB — PHOSPHORUS: Phosphorus: 3.1 mg/dL (ref 2.5–4.6)

## 2023-02-14 MED ORDER — LEVETIRACETAM IN NACL 1500 MG/100ML IV SOLN
1500.0000 mg | Freq: Two times a day (BID) | INTRAVENOUS | Status: DC
  Administered 2023-02-14 – 2023-02-15 (×2): 1500 mg via INTRAVENOUS
  Filled 2023-02-14 (×2): qty 100

## 2023-02-14 NOTE — Consult Note (Signed)
Marland Kitchen   HEMATOLOGY/ONCOLOGY CONSULTATION NOTE  Date of Service: 02/14/2023  Patient Care Team: Gweneth Dimitri, MD as PCP - General (Family Medicine) Johney Maine, MD as Consulting Physician (Hematology)  CHIEF COMPLAINTS/PURPOSE OF CONSULTATION:  Rectus sheath hematoma in a patient with CLL  HISTORY OF PRESENTING ILLNESS:   Selena Branch is a wonderful 82 y.o. female who has been referred to [place] by Dr Randol Kern MD for evaluation and management of rectus sheath hematoma. Patient is well-known to [place] from oncology clinic.  She is is followed and treated for CLL with ITP.  Patient has been in remission with CLL after treatment with venetoclax and Gazyva.  She continues to be on Promacta 25 mg Monday Wednesday Friday for treatment of her ITP.  Patient has a history of previous hypertensive CNS bleed, glaucoma, dyslipidemia and presented to the emergency room on 02/04/2023 with altered mental status and aphasia while she was on the phone at home.  She was noted to have status epilepticus and altered mental status related to delirium. Patient developed acute blood loss anemia and was noted to have a rectus sheath hematoma tracking into the peritoneum. Patient's platelets have remained about 100k.  No known hypotensive episodes.  Hemoglobin is now stabilizing after blood transfusion support. We were asked to weigh in about management of her Promacta. Patient was resting with no family at bedside when visited. Most of information obtained from the chart and in discussion with Dr. Randol Kern.   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 No pertinent surgical history.  SOCIAL HISTORY: Social History   Socioeconomic History   Marital status: Married    Spouse name: Not on file   Number of children: 1   Years of  education: HS   Highest education level: Not on file  Occupational History   Occupation: Retired  Tobacco Use   Smoking status: Never   Smokeless tobacco: Never  Vaping Use   Vaping Use: Never used  Substance and Sexual Activity   Alcohol use: No    Alcohol/week: 0.0 standard drinks of alcohol   Drug use: No   Sexual activity: Yes    Birth control/protection: Post-menopausal  Other Topics Concern   Not on file  Social History Narrative   Drinks 2 cups of coffee   Social Determinants of Health   Financial Resource Strain: Not on file  Food Insecurity: Not on file  Transportation Needs: Not on file  Physical Activity: Not on file  Stress: Not on file  Social Connections: Not on file  Intimate Partner Violence: Not on file    FAMILY HISTORY: Family History  Problem Relation Age of Onset   Stroke Mother    Lung cancer Father    Leukemia Neg Hx    Breast cancer Neg Hx    Ovarian cancer Neg Hx    Colon cancer Neg Hx    Endometrial cancer Neg Hx    Prostate cancer Neg Hx    Pancreatic cancer Neg Hx     ALLERGIES:  is allergic to codeine, evista [raloxifene], actonel [risedronate], boniva [ibandronic acid], fosamax [alendronate], and prednisone.  MEDICATIONS:  Current Facility-Administered Medications  Medication Dose Route Frequency Provider Last Rate Last Admin   acetaminophen (TYLENOL) tablet 650 mg  650 mg Per Tube Q4H  PRN Elgergawy, Leana Roe, MD   650 mg at 02/13/23 1312   Or   acetaminophen (TYLENOL) suppository 650 mg  650 mg Rectal Q4H PRN Elgergawy, Leana Roe, MD       amLODipine (NORVASC) tablet 10 mg  10 mg Per Tube QHS Elgergawy, Leana Roe, MD   10 mg at 02/12/23 2146   atorvastatin (LIPITOR) tablet 40 mg  40 mg Per Tube Daily Elgergawy, Leana Roe, MD   40 mg at 02/13/23 0947   carvedilol (COREG) tablet 3.125 mg  3.125 mg Per Tube BID WC Elgergawy, Leana Roe, MD   3.125 mg at 02/13/23 1707   chlorhexidine gluconate (MEDLINE KIT) (PERIDEX) 0.12 % solution 15 mL   15 mL Mouth Rinse BID Rodolph Bong, MD   15 mL at 02/13/23 2057   Chlorhexidine Gluconate Cloth 2 % PADS 6 each  6 each Topical Daily Leroy Sea, MD   6 each at 02/13/23 0948   dorzolamide-timolol (COSOPT) 2-0.5 % ophthalmic solution 1 drop  1 drop Both Eyes Daily Rodolph Bong, MD   1 drop at 02/13/23 1012   eltrombopag (PROMACTA) tablet 25 mg  25 mg Oral Q M,W,F Pham, Minh Q, RPH-CPP   25 mg at 02/12/23 0981   feeding supplement (JEVITY 1.5 CAL/FIBER) liquid 1,000 mL  1,000 mL Per Tube Continuous Elgergawy, Leana Roe, MD 40 mL/hr at 02/13/23 1920 Restarted at 02/13/23 1920   feeding supplement (PROSource TF20) liquid 60 mL  60 mL Per Tube Daily Elgergawy, Leana Roe, MD   60 mL at 02/13/23 0950   free water 130 mL  130 mL Per Tube Q4H Elgergawy, Leana Roe, MD   130 mL at 02/14/23 0017   furosemide (LASIX) injection 20 mg  20 mg Intravenous Once Elgergawy, Leana Roe, MD       hydrALAZINE (APRESOLINE) injection 5 mg  5 mg Intravenous Q6H PRN Rodolph Bong, MD       hydrALAZINE (APRESOLINE) tablet 25 mg  25 mg Per Tube Q6H Elgergawy, Leana Roe, MD   25 mg at 02/14/23 0016   lacosamide (VIMPAT) 200 mg in sodium chloride 0.9 % 25 mL IVPB  200 mg Intravenous Q12H Milon Dikes, MD 90 mL/hr at 02/13/23 2244 200 mg at 02/13/23 2244   latanoprost (XALATAN) 0.005 % ophthalmic solution 1 drop  1 drop Both Eyes QHS Rodolph Bong, MD   1 drop at 02/13/23 2109   levETIRAcetam (KEPPRA) 2,000 mg in sodium chloride 0.9 % 250 mL IVPB  2,000 mg Intravenous Q12H Charlsie Quest, MD 900 mL/hr at 02/14/23 0045 2,000 mg at 02/14/23 0045   midazolam (VERSED) injection 1 mg  1 mg Intravenous Q4H PRN Charlsie Quest, MD       multivitamin with minerals tablet 1 tablet  1 tablet Per Tube Daily Elgergawy, Leana Roe, MD   1 tablet at 02/13/23 0947   ondansetron (ZOFRAN) tablet 4 mg  4 mg Per Tube Q6H PRN Elgergawy, Leana Roe, MD       Or   ondansetron (ZOFRAN) injection 4 mg  4 mg Intravenous Q6H PRN  Elgergawy, Leana Roe, MD       Oral care mouth rinse  15 mL Mouth Rinse 10 times per day Rodolph Bong, MD   15 mL at 02/14/23 0017   pantoprazole (PROTONIX) injection 40 mg  40 mg Intravenous Q12H Elgergawy, Leana Roe, MD   40 mg at 02/13/23 2055   polyethylene glycol (MIRALAX / GLYCOLAX)  packet 17 g  17 g Per Tube Daily PRN Elgergawy, Leana Roe, MD       potassium chloride (KLOR-CON) packet 40 mEq  40 mEq Oral Once Leroy Sea, MD       QUEtiapine (SEROQUEL) tablet 25 mg  25 mg Per Tube Q24H Elgergawy, Leana Roe, MD   25 mg at 02/13/23 1708   sertraline (ZOLOFT) tablet 25 mg  25 mg Per Tube Daily Elgergawy, Leana Roe, MD   25 mg at 02/13/23 0947   silver sulfADIAZINE (SILVADENE) 1 % cream   Topical BID Elgergawy, Leana Roe, MD   Given at 02/13/23 2055   sodium chloride flush (NS) 0.9 % injection 3 mL  3 mL Intravenous Q12H Rodolph Bong, MD   3 mL at 02/13/23 2055   sorbitol 70 % solution 30 mL  30 mL Per Tube Daily PRN Elgergawy, Leana Roe, MD       tamsulosin (FLOMAX) capsule 0.4 mg  0.4 mg Oral Daily Susa Raring K, MD   0.4 mg at 02/13/23 0947   thiamine (VITAMIN B1) tablet 100 mg  100 mg Per Tube Daily Elgergawy, Leana Roe, MD   100 mg at 02/13/23 0947   valproate (DEPACON) 250 mg in dextrose 5 % 50 mL IVPB  15 mg/kg/day Intravenous Thedora Hinders, MD 52.5 mL/hr at 02/13/23 2107 250 mg at 02/13/23 2107    REVIEW OF SYSTEMS:    10 Point review of Systems was done is negative except as noted above.  PHYSICAL EXAMINATION: ECOG PERFORMANCE STATUS: 3 - Symptomatic, >50% confined to bed  . Vitals:   02/14/23 0000 02/14/23 0014  BP: 126/62   Pulse: 84   Resp:    Temp: 97.9 F (36.6 C)   SpO2: 92% 96%   Filed Weights   02/04/23 1500 02/13/23 0500  Weight: 108 lb 7.5 oz (49.2 kg) 110 lb 0.2 oz (49.9 kg)   .Body mass index is 22.99 kg/m.  GENERAL:alert, in no acute distress and comfortable LYMPH:  no palpable lymphadenopathy in the cervical, axillary or inguinal  regions LUNGS: clear to auscultation b/l with normal respiratory effort HEART: regular rate & rhythm ABDOMEN:  normoactive bowel sounds , non tender, not distended. Extremity: no pedal edema NEURO: Patient sleepy and this could not be evaluated  LABORATORY DATA:  I have reviewed the data as listed  .    Latest Ref Rng & Units 02/13/2023    5:29 PM 02/13/2023    6:34 AM 02/12/2023   10:32 PM  CBC  WBC 4.0 - 10.5 K/uL 10.7  10.3  9.0   Hemoglobin 12.0 - 15.0 g/dL 40.9  81.1  9.4   Hematocrit 36.0 - 46.0 % 33.0  33.4  26.8   Platelets 150 - 400 K/uL 144  137  134     .    Latest Ref Rng & Units 02/13/2023    6:34 AM 02/12/2023   10:47 AM 02/12/2023    5:29 AM  CMP  Glucose 70 - 99 mg/dL 914   782   BUN 8 - 23 mg/dL 13   21   Creatinine 9.56 - 1.00 mg/dL 2.13   0.86   Sodium 578 - 145 mmol/L 140   140   Potassium 3.5 - 5.1 mmol/L 3.6   3.7   Chloride 98 - 111 mmol/L 101   109   CO2 22 - 32 mmol/L 24   24   Calcium 8.9 - 10.3 mg/dL 8.5  8.3   Total Protein 6.5 - 8.1 g/dL  5.0    Total Bilirubin 0.3 - 1.2 mg/dL  0.4    Alkaline Phos 38 - 126 U/L  46    AST 15 - 41 U/L  31    ALT 0 - 44 U/L  20       RADIOGRAPHIC STUDIES: I have personally reviewed the radiological images as listed and agreed with the findings in the report. Overnight EEG with video  Result Date: 02/13/2023 Charlsie Quest, MD     02/13/2023  8:38 AM Patient Name: Selena Branch MRN: [id number] Epilepsy Attending: Charlsie Quest Referring Physician/Provider: Milon Dikes, MD Duration: 02/13/2023 0050 to 0830  Patient history: 82 y.o. female with a past medical history of CLL, ITP, left parietal ICH in 2021 presenting with aphasia and generalized weakness. EEG to evaluate for seizure.  Level of alertness: awake. asleep  AEDs during EEG study: LEV, LCM  Technical aspects: This EEG study was done with scalp electrodes positioned according to the 10-20 International system of electrode placement. Electrical activity was  reviewed with band pass filter of 1-70Hz , sensitivity of 7 uV/mm, display speed of 35mm/sec with a 60Hz  notched filter applied as appropriate. EEG data were recorded continuously and digitally stored.  Video monitoring was available and reviewed as appropriate.  Description:   No clear posterior dominant rhythm was seen. EEG showed continuous generalized and lateralized left hemisphere 5-9Hz  theta-alpha activity admixed with intermittent 2-3hz  delta slowing. Frequent sharp waves were also noted arising from left hemisphere.  Hyperventilation and photic stimulation were not performed.  ABNORMALITY - Sharp waves, left hemisphere - Continuous slow, generalized and lateralized left hemisphere  IMPRESSION: This study showed evidence of epileptogenicity and  cortical dysfunction arising from left hemisphere likely secondary to underlying structural abnormality. Additionally there is moderate diffuse encephalopathy. No definite seizures were seen.  Charlsie Quest   EEG adult  Result Date: 02/13/2023 Charlsie Quest, MD     02/13/2023  8:38 AM Patient Name: Selena Branch MRN: [id number] Epilepsy Attending: Charlsie Quest Referring Physician/Provider: Caryl Pina, MD Duration: 31.32 mins  Patient history: 82 y.o. female with a past medical history of CLL, ITP, left parietal ICH in 2021 presenting with aphasia and generalized weakness. EEG to evaluate for seizure.  Level of alertness: awake. asleep  AEDs during EEG study: LEV, LCM  Technical aspects: This EEG study was done with scalp electrodes positioned according to the 10-20 International system of electrode placement. Electrical activity was reviewed with band pass filter of 1-70Hz , sensitivity of 7 uV/mm, display speed of 64mm/sec with a 60Hz  notched filter applied as appropriate. EEG data were recorded continuously and digitally stored.  Video monitoring was available and reviewed as appropriate.  Description:   No clear posterior dominant rhythm was seen. EEG  showed continuous generalized and lateralized left hemisphere 3 to 6 Hz theta-delta slowing. Frequent sharp waves were also noted arising from left hemisphere.  Hyperventilation and photic stimulation were not performed.  Patient was noted to have upward gaze deviation with mouth opening and closing repetitively at 2309 on 02/12/2023. Concomitant eeg before, during and after the event didn't show any eeg change to suggest seizure ABNORMALITY - Sharp waves, left hemisphere - Continuous slow, generalized and lateralized left hemisphere  IMPRESSION: This study showed evidence of epileptogenicity and  cortical dysfunction arising from left hemisphere likely secondary to underlying structural abnormality. Additionally there is moderate diffuse encephalopathy. No definite seizures were seen. On  02/12/2023 at 2309, patient was noted to have upward gaze deviation with mouth opening and closing repetitively without concomitant eeg change. This was most likely not an epileptic event.  Charlsie Quest   CT ABDOMEN PELVIS WO CONTRAST  Addendum Date: 02/13/2023   ADDENDUM REPORT: 02/13/2023 07:34 ADDENDUM: Study discussed by telephone with Dr. Huey Bienenstock on 02/13/2023 at 0732 hours. Electronically Signed   By: Odessa Fleming M.D.   On: 02/13/2023 07:34   Result Date: 02/13/2023 CLINICAL DATA:  82 year old female with blood loss anemia. Query bleeding source. EXAM: CT ABDOMEN AND PELVIS WITHOUT CONTRAST TECHNIQUE: Multidetector CT imaging of the abdomen and pelvis was performed following the standard protocol without IV contrast. RADIATION DOSE REDUCTION: This exam was performed according to the departmental dose-optimization program which includes automated exposure control, adjustment of the mA and/or kV according to patient size and/or use of iterative reconstruction technique. COMPARISON:  CT Chest, Abdomen, and Pelvis 12/01/2020. FINDINGS: Lower chest: No cardiomegaly. Small layering pleural effusions with associated lower  lobe lung opacity most resembling atelectasis. Hepatobiliary: Small layering gallstones. Negative noncontrast liver. Pancreas: Negative. Spleen: Negative. Adrenals/Urinary Tract: Negative adrenal glands, noncontrast kidneys. Foley catheter decompresses the urinary bladder, which is bordered by some pelvic hematoma. See additional details below. Stomach/Bowel: Enteric feeding tube terminates in the 2nd portion of the duodenum right upper quadrant series 3, image 25. No free air. No dilated large or small bowel loops. Nonspecific fluid containing large bowel from the right colon to the redundant splenic flexure. Decompressed descending colon with extensive diverticulosis. Redundant but decompressed sigmoid colon. However, there is a small volume of layering complex fluid density in both pericolic gutters, on the right tracking up toward the inferior margin of the liver. But this is a relatively small volume compared to pelvis findings below. Vascular/Lymphatic: Normal caliber abdominal aorta. Aortoiliac calcified atherosclerosis. Vascular patency is not evaluated in the absence of IV contrast. No lymphadenopathy identified. Reproductive: Chronic left ovarian cyst with simple fluid density has not significantly changed since CT pelvis 08/31/2019 (no follow-up imaging recommended). Uterus remains in place. Other: Nonspecific mild to moderate presacral stranding or hyperdense fluid,, and this is probably layering presacral hematoma. However, there is heterogeneous enlargement of the lower right rectus muscle, primarily between the deep rectus and the peritoneal cavity. This extends into the deep pelvis anterior space of Retzius, and encompasses 42 x 71 x 103 mm (AP by transverse by CC) for an estimated hematoma volume there of 154 mL. Smaller volume additional pelvic sidewall hematoma also identified on the right series 3, image 66. 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 Spine  degeneration. No acute or suspicious osseous lesion. IMPRESSION: 1. Positive for evidence of Pelvic Hematoma, which might have originated from Intramuscular hemorrhage of the Right Lower Rectus Muscle. Deep lower right rectus muscle sheath hematoma is estimated at 154 mL, with evidence of additional scattered pelvic sidewall, space of Retzius, and presacral hematoma. 2. Furthermore, there is a small volume of paracolic gutter layering Hemoperitoneum suspected. But no evidence of bowel obstruction or bowel perforation. But nonspecific fluid in the proximal large bowel. Query red blood per rectum. 3. Enteric feeding tube placed into the proximal duodenum. Foley catheter decompresses the urinary bladder. 4. Small layering pleural effusions with lung base atelectasis. 5. Cholelithiasis.  Aortic Atherosclerosis (ICD10-I70.0). Electronically Signed: By: Odessa Fleming M.D. On: 02/13/2023 07:29   DG CHEST PORT 1 VIEW  Result Date: 02/13/2023 CLINICAL DATA:  82 year old female with rectus muscle/pelvis hematoma. Anemia. Shortness  of breath. EXAM: PORTABLE CHEST 1 VIEW COMPARISON:  CT Abdomen and Pelvis 0700 hours today. FINDINGS: Portable AP view at 0511 hours. Enteric feeding tube courses to the abdomen. Small volume layering pleural effusions and lung base atelectasis better demonstrated by CT. Otherwise Allowing for portable technique the lungs are clear. Mediastinal contours are within normal limits. Calcified aortic atherosclerosis. Visualized tracheal air column is within normal limits. Negative visible bowel gas pattern. IMPRESSION: 1. Small layering pleural effusions and lung base by CT. 2. No other acute cardiopulmonary abnormality. Electronically Signed   By: Odessa Fleming M.D.   On: 02/13/2023 07:34   DG Abd Portable 1V  Result Date: 02/11/2023 CLINICAL DATA:  Feeding tube placement EXAM: PORTABLE ABDOMEN - 1 VIEW COMPARISON:  None Available. FINDINGS: Enteric tube tip is at the level of the gastric antrum/proximal  duodenal. No dilated bowel loops are seen. No fractures are identified. IMPRESSION: Enteric tube tip is at the level of the gastric antrum/proximal duodenal. Electronically Signed   By: Darliss Cheney M.D.   On: 02/11/2023 15:33   Overnight EEG with video  Result Date: 02/05/2023 Charlsie Quest, MD     02/07/2023  4:52 AM Patient Name: Selena Branch MRN: [id number] Epilepsy Attending: Charlsie Quest Referring Physician/Provider: Marjorie Smolder, NP Duration: 02/04/2023 1629 02/05/2023 1629 Patient history: 82 y.o. female with a past medical history of CLL, ITP, left parietal ICH in 2021 presenting with aphasia and generalized weakness. EEG to evaluate for seizure. Level of alertness: lethargic AEDs during EEG study: LEV Technical aspects: This EEG study was done with scalp electrodes positioned according to the 10-20 International system of electrode placement. Electrical activity was reviewed with band pass filter of 1-70Hz , sensitivity of 7 uV/mm, display speed of 73mm/sec with a 60Hz  notched filter applied as appropriate. EEG data were recorded continuously and digitally stored.  Video monitoring was available and reviewed as appropriate. Description: At the beginning of study, EEG showed lateralized periodic discharges in left hemisphere, maximal left parietal region at 2.5-3Hz  admixed with 3-5hz  theta-delta slowing and overriding 13-15hz  beta activity. Clinically patient was aphasic per neurologist. 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 Patient was loaded with Keppra  on 02/04/2023 at 1716 and subsequently status epilepticus resolved. After around 1830, EEG showed continuous generalized and lateralized left hemisphere 3 to 6 Hz theta-delta slowing. Lateralized periodic discharges at 0.5-1hz  were also noted arising from left hemisphere. Hyperventilation and photic stimulation were not performed.   EEG was difficult to interpret between 02/04/2023 1946 to 02/05/2023  1010 due to significant electrode artifact. 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 Clinically, per neurologist patient was aphasic. Patient was loaded with keppra  on 02/04/2023 at 1716 and subsequently status epilepticus resolved. EEG was then suggestive of epileptogenicity and cortical dysfunction arising from left hemisphere likely secondary to underlying structural abnormality and increased risk of seizure recurrence. Lastly there is moderate diffuse encephalopathy. Charlsie Quest   DG Chest Port 1 View  Result Date: 02/04/2023 CLINICAL DATA:  Altered mental status EXAM: PORTABLE CHEST 1 VIEW COMPARISON:  CT 12/01/2020 FINDINGS: Low lung volumes. Patchy retrocardiac opacity. No pleural effusion. Normal cardiac size. Aortic atherosclerosis IMPRESSION: Low lung volumes with patchy retrocardiac opacity, atelectasis versus pneumonia. Electronically Signed  By: Jasmine Pang M.D.   On: 02/04/2023 17:22   MR BRAIN WO CONTRAST  Result Date: 02/04/2023 CLINICAL DATA:  Neuro deficit, acute, stroke suspected. EXAM: MRI HEAD WITHOUT CONTRAST TECHNIQUE: Multiplanar, multiecho pulse sequences of the brain and surrounding structures were obtained without intravenous contrast. COMPARISON:  Head CT 02/04/2023 and older.  MRI brain 01/14/2016. FINDINGS: Brain: No acute infarct or hemorrhage. Sequela of prior hemorrhage in the left superior temporal gyrus. Old microhemorrhage in the posterior right temporal lobe. No hydrocephalus or extra-axial collection. No mass or midline shift. Vascular: Normal flow voids. Skull and upper cervical spine: Normal marrow signal. Sinuses/Orbits: Unremarkable. Other: None.  IMPRESSION: No acute intracranial process. Electronically Signed   By: Orvan Falconer M.D.   On: 02/04/2023 16:10   CT ANGIO HEAD NECK W WO CM (CODE STROKE)  Result Date: 02/04/2023 CLINICAL DATA:  Neuro deficit, acute, stroke suspected right sided weakness, aphasia. EXAM: CT ANGIOGRAPHY HEAD AND NECK WITH AND WITHOUT CONTRAST TECHNIQUE: Multidetector CT imaging of the head and neck was performed using the standard protocol during bolus administration of intravenous contrast. Multiplanar CT image reconstructions and MIPs were obtained to evaluate the vascular anatomy. Carotid stenosis measurements (when applicable) are obtained utilizing NASCET criteria, using the distal internal carotid diameter as the denominator. RADIATION DOSE REDUCTION: This exam was performed according to the departmental dose-optimization program which includes automated exposure control, adjustment of the mA and/or kV according to patient size and/or use of iterative reconstruction technique. CONTRAST:  50mL OMNIPAQUE IOHEXOL 350 MG/ML SOLN COMPARISON:  Head CT 02/04/2023.  MRI brain 01/14/2016. FINDINGS: CTA NECK FINDINGS Aortic arch: Three-vessel arch configuration. Atherosclerotic calcifications of the aortic arch and arch vessel origins. Arch vessel origins are patent. Right carotid system: No evidence of dissection, stenosis (50% or greater), or occlusion. Left carotid system: No evidence of dissection, stenosis (50% or greater), or occlusion. Vertebral arteries: Codominant. No evidence of dissection, stenosis (50% or greater), or occlusion. 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 Other neck: Unremarkable. Upper chest: Unremarkable. Review of the MIP images confirms the above findings CTA HEAD FINDINGS Anterior circulation: Intracranial ICAs are patent without stenosis or aneurysm. The proximal ACAs and MCAs are patent without stenosis or aneurysm. Distal  branches are symmetric. Posterior circulation: Normal basilar artery. The SCAs, AICAs and PICAs are patent proximally. The PCAs are patent proximally without stenosis or aneurysm. Distal branches are symmetric. Venous sinuses: Patent. 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 Review of the MIP images confirms the above findings IMPRESSION: No large vessel occlusion, hemodynamically significant stenosis or aneurysm of the head or neck vessels. Aortic Atherosclerosis (ICD10-I70.0). Electronically Signed   By: Orvan Falconer M.D.   On: 02/04/2023 15:42   CT HEAD CODE STROKE WO CONTRAST  Result Date: 02/04/2023 CLINICAL DATA:  Code stroke. Neuro deficit, acute, stroke suspected no lateralizing information provided. EXAM: CT HEAD WITHOUT CONTRAST TECHNIQUE: Contiguous axial images were obtained from the base of the skull through the vertex without intravenous contrast. RADIATION DOSE REDUCTION: This exam was performed according to the departmental dose-optimization program which includes automated exposure control, adjustment of the mA and/or kV according to patient size and/or use of iterative reconstruction technique. COMPARISON:  CT Head 11/14/19 FINDINGS: Brain: No evidence of acute infarction, hemorrhage, hydrocephalus, extra-axial collection or mass lesion/mass effect. Sequela of moderate chronic microvascular ischemic change. Unchanged small cystic lesion in the left temporal lobe (series  2, image 17), previously favored to represent a chronic evolving hemorrhage. Vascular: No hyperdense vessel or unexpected calcification. Skull: Normal. Negative for fracture or focal lesion. Sinuses/Orbits: No middle ear or mastoid effusion. Paranasal sinuses are clear. Orbits are notable for bilateral lens replacement, otherwise unremarkable. Other: None. ASPECTS [facility] Stroke Program Early CT Score):: 10 IMPRESSION: No acute hemorrhage or CT evidence of an acute cortical  infarct. Findings were paged to Dr. Pearlean Brownie on 02/04/23 at 3:17 PM via [facility] paging system. Electronically Signed   By: Lorenza Cambridge M.D.   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  #2 altered mental status due to status epilepticus and hospital related delirium  #3 CLL currently in remission.  She has been off venetoclax for nearly 1 year.  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 Monday Wednesday Friday  #5 rectus sheath hematoma with acute blood loss anemia.  Rectus sheath hematoma 0 more common in patients about 76 to 83 years of age and especially in slender elderly woman due to atrophy of the rectus abdominis muscle.  In this situation today and for epigastric artery can get injured.  It enters the muscle.  This injury could be due to trauma or muscle injury with movements sudden contraction such as with seizures falls abnormal turning etc.. Patient has not had any previous use of blood thinners but was on subcu heparin during the hospitalization. Her platelets have now been less than 100,000 and she is doing well with her current dose of Promacta.  Plan -Appreciate excellent care by hospital medicine by Dr. Randol Kern. -No indication for treatment of CLL at this time. -I believe the rectus sheath hematoma was likely due to injury due to cerebral muscle contraction during her status epilepticus which could have presented in a delayed fashion.  It could also be from other injuries to the area where the infra epigastric artery enters the rectus abdominis muscle and any injury in that area cannot be compressed and could bleed. -Avoid medications that could make the bleeding worse.  Control blood  pressure well. -Platelets remain more than 100k would recommend continuing current dose of Promacta at 25 mg Monday Wednesday Friday.  This dose can be extended 2 more days of the week if needed to maintain platelets of more than 100k at this time, -Patient will be scheduled for outpatient hematology follow-up.Marland Kitchen  Please let us know if any other questions or concerns arise.  Wyvonnia Lora MD MS AAHIVMS [facility] Hematology/Oncology Physician [facility]  .*Total Encounter Time as defined by the Centers for Medicare and Medicaid Services includes, in addition to the face-to-face time of a patient visit (documented in the note above) non-face-to-face time: obtaining and reviewing outside history, ordering and reviewing medications, tests or procedures, care coordination (communications with other health care professionals or caregivers) and documentation in the medical record.

## 2023-02-14 NOTE — Progress Notes (Signed)
LTM EEG discontinued - no skin breakdown at unhook.   

## 2023-02-14 NOTE — Progress Notes (Signed)
Cortrak Tube Team Note:  Consult received to unclog an existing Cortrak feeding tube.  Cortrak tube was unclogged/unkinked successfully at the bedside. Both Cortrak RD and RN able to flush tube without difficulty. Tube remains in left nare, now at 59 cm at the nare.  X-ray is required, abdominal x-ray has been ordered by the Cortrak team. Please confirm tube placement before using the Cortrak tube.   If the tube becomes dislodged please keep the tube and contact the Cortrak team at [email or web address] for replacement.  If after hours and replacement cannot be delayed, place a NG tube and confirm placement with an abdominal x-ray.    Mertie Clause, MS, RD, LDN Inpatient Clinical Dietitian Please see AMiON for contact information.

## 2023-02-14 NOTE — TOC Progression Note (Signed)
Transition of Care [facility]) - Progression Note    Patient Details  Name: Selena Branch MRN: [id number] Date of Birth: Feb 22, 1941  [facility] CM/SW Contact  Mearl Latin, LCSW Phone Number: 02/14/2023, 9:03 AM  Clinical Narrative:    CSW continuing to follow for placement needs.    Expected Discharge Plan: Skilled Nursing Facility Barriers to Discharge: Continued Medical Work up, English as a second language teacher, Requiring sitter/restraints, SNF Pending bed offer  Expected Discharge Plan and Services In-house Referral: Clinical Social Work   Post Acute Care Choice: Skilled Nursing Facility Living arrangements for the past 2 months: Single Family Home                                       Social Determinants of Health (SDOH) Interventions SDOH Screenings   Depression (PHQ2-9): Low Risk  (09/28/2019)  Tobacco Use: Low Risk  (02/04/2023)    Readmission Risk Interventions     No data to display

## 2023-02-14 NOTE — Progress Notes (Signed)
Physical Therapy Treatment Patient Details Name: Selena Branch MRN: [id number] DOB: 12-06-1940 Today's Date: 02/14/2023   History of Present Illness Selena Branch is a 82 y.o. female admitted to [facility] on 5/24 with sudden onset altered mental status/aphasia. Selena Branch; EEG: positive for seizures. PMHx: CLL currently in remission, glaucoma, hypertension, thrombocytopenia, hyperlipidemia [id number]; EEG: positive for seizures.    PT Comments    Pt greeted sidelying in bed with RN present, restless and able to participate in session. Pt able to come to sitting EOB with mod A to elevate trunk with pt demonstrating fair command following. Pt able to transfer to stand with HHA +1 and maintain static standing at EOB for ~5 mins total with cues needed throughout for upright posture. Pt able to complete pre-gait activities in standing and sidestep up to [facility] with verbal cues. Pt able to return to supine with mod A to manage trunk with pt demonstrating good initiation to return Les to bed and bridge hips to position self midline. Current plan remains appropriate to address deficits and maximize functional independence and decrease caregiver burden. Pt continues to benefit from skilled PT services to progress toward functional mobility goals.    Recommendations for follow up therapy are one component of a multi-disciplinary discharge planning process, led by the attending physician.  Recommendations may be updated based on patient status, additional functional criteria and insurance authorization.  Follow Up Recommendations  Can patient physically be transported by private vehicle: No    Assistance Recommended at Discharge Frequent or constant Supervision/Assistance  Patient can return home with the following A lot of help with walking and/or transfers;A lot of help with bathing/dressing/bathroom;Direct supervision/assist for medications management;Direct supervision/assist for financial management;Assistance  with cooking/housework;Assist for transportation;Help with stairs or ramp for entrance   Equipment Recommendations  Other (comment) (defer to next level of care)    Recommendations for Other Services       Precautions / Restrictions Precautions Precautions: Fall;Other (comment) Precaution Comments: seizure Restrictions Weight Bearing Restrictions: No     Mobility  Bed Mobility Overal bed mobility: Needs Assistance Bed Mobility: Supine to Sit, Sit to Supine     Supine to sit: HOB elevated, Mod assist Sit to supine: Mod assist   General bed mobility comments: mod A provided for safety with balance and trunk support along with line management, min A provided to BLEs to return to supine with pt demonstrating improved initiation sit>supine    Transfers Overall transfer level: Needs assistance Equipment used: 1 person hand held assist Transfers: Sit to/from Stand Sit to Stand: Min assist           General transfer comment: Pt standing at EOB with minA pt able to march in place and side step tp HOB with good command following, increased cues for upright posture with multimodal cues    Ambulation/Gait             Pre-gait activities: standing marching x30 seconds General Gait Details: deferred this session d/t cognition   Stairs             Wheelchair Mobility    Modified Rankin (Stroke Patients Only)       Balance Overall balance assessment: Needs assistance Sitting-balance support: Bilateral upper extremity supported, Feet supported Sitting balance-Leahy Scale: Fair Sitting balance - Comments: minG to minA for balance at EOB due to safety concerns   Standing balance support: Single extremity supported Standing balance-Leahy Scale: Fair Standing balance comment: minG to minA provided for safety and  balance with intermittent 1UE support                            Cognition Arousal/Alertness: Awake/alert Behavior During Therapy: Flat  affect Overall Cognitive Status: Impaired/Different from baseline Area of Impairment: Orientation, Memory, Following commands, Safety/judgement, Problem solving                 Orientation Level: Disoriented to, Place, Time, Situation   Memory: Decreased recall of precautions, Decreased short-term memory Following Commands: Follows one step commands inconsistently, Follows one step commands with increased time Safety/Judgement: Decreased awareness of safety, Decreased awareness of deficits   Problem Solving: Slow processing, Difficulty sequencing, Requires verbal cues, Requires tactile cues          Exercises Other Exercises Other Exercises: supring bridge x5    General Comments General comments (skin integrity, edema, etc.): VSS on RA, pt perseverating on phrase "im sorry" throughout session, provided encouragement      Pertinent Vitals/Pain Pain Assessment Pain Assessment: Faces Faces Pain Scale: Hurts a little bit Pain Location: uncomfortable with restraints Pain Descriptors / Indicators: Grimacing, Discomfort Pain Intervention(s): Monitored during session, Repositioned    Home Living                          Prior Function            PT Goals (current goals can now be found in the care plan section) Acute Rehab PT Goals PT Goal Formulation: Patient unable to participate in goal setting Time For Goal Achievement: 02/22/23 Progress towards PT goals: Progressing toward goals    Frequency    Min 3X/week      PT Plan      Co-evaluation              AM-PAC PT "6 Clicks" Mobility   Outcome Measure  Help needed turning from your back to your side while in a flat bed without using bedrails?: A Lot Help needed moving from lying on your back to sitting on the side of a flat bed without using bedrails?: A Lot Help needed moving to and from a bed to a chair (including a wheelchair)?: A Lot Help needed standing up from a chair using your arms  (e.g., wheelchair or bedside chair)?: A Little Help needed to walk in hospital room?: A Lot Help needed climbing 3-5 steps with a railing? : Total 6 Click Score: 12    End of Session   Activity Tolerance: Patient tolerated treatment well Patient left: in bed;with call bell/phone within reach;with bed alarm set;with restraints reapplied;Other (comment) (with dietician present) Nurse Communication: Mobility status PT Visit Diagnosis: Muscle weakness (generalized) (M62.81);Other abnormalities of gait and mobility (R26.89);Unsteadiness on feet (R26.81)     Time: 1610-9604 PT Time Calculation (min) (ACUTE ONLY): 24 min  Charges:  $Therapeutic Activity: 23-37 mins                     Railee Bonillas R. PTA Acute Rehabilitation Services Office: [phone]   Catalina Antigua 02/14/2023, 2:41 PM

## 2023-02-14 NOTE — Procedures (Addendum)
Patient Name: Selena Branch  MRN: [id number]  Epilepsy Attending: Charlsie Quest  Referring Physician/Provider: Milon Dikes, MD  Duration: 02/14/2023 0050 to 02/14/2023 1053   Patient history: 82 y.o. female with a past medical history of CLL, ITP, left parietal ICH in 2021 presenting with aphasia and generalized weakness. EEG to evaluate for seizure.   Level of alertness: awake. asleep   AEDs during EEG study: LEV, LCM, VPA   Technical aspects: This EEG study was done with scalp electrodes positioned according to the 10-20 International system of electrode placement. Electrical activity was reviewed with band pass filter of 1-70Hz , sensitivity of 7 uV/mm, display speed of 17mm/sec with a 60Hz  notched filter applied as appropriate. EEG data were recorded continuously and digitally stored.  Video monitoring was available and reviewed as appropriate.   Description:  The posterior dominant rhythm consists of 8-9 Hz activity of moderate voltage (25-35 uV) seen predominantly in posterior head regions, symmetric and reactive to eye opening and eye closing.  Sleep was characterized by sleep spindles (12 to 14 Hz), maximal frontocentral region.  EEG showed continuous generalized and lateralized left hemisphere 5-9Hz  theta-alpha activity admixed with intermittent 2-3hz  delta slowing. Sharp waves were also noted arising from left hemisphere.  Hyperventilation and photic stimulation were not performed.     ABNORMALITY - Sharp waves, left hemisphere - Continuous slow, generalized and lateralized left hemisphere   IMPRESSION: This study showed evidence of epileptogenicity and  cortical dysfunction arising from left hemisphere likely secondary to underlying structural abnormality. Additionally there is moderate diffuse encephalopathy. No definite seizures were seen.   [name]

## 2023-02-14 NOTE — Progress Notes (Addendum)
Subjective: Continues to be very confused, perseverating on "please help me"  ROS: Unable to obtain due to poor mental status  Examination  Vital signs in last 24 hours: Temp:  [97.6 F (36.4 C)-98.8 F (37.1 C)] 98.8 F (37.1 C) (06/03 1200) Pulse Rate:  [66-93] 86 (06/03 1200) Resp:  [18] 18 (06/03 1200) BP: (95-155)/(42-73) 155/73 (06/03 1200) SpO2:  [91 %-97 %] 97 % (06/03 1200) Weight:  [49.9 kg] 49.9 kg (06/03 0706)  General: lying in bed, in restraints  Neuro: MS: Alert, able to tell me her name and that she is at [facility] but did not answer any other questions.  Did tell me that I was holding up 2 fingers but did not name any objects or follow any other commands, perseverating on "please help me" CN: pupils equal and reactive,  EOMI, face symmetric, tongue midline, normal sensation over face, Motor: Spontaneously moving all 4 extremities in bed   Basic Metabolic Panel: Recent Labs  Lab 02/10/23 0444 02/11/23 0456 02/11/23 1623 02/12/23 0529 02/12/23 1620 02/13/23 0634 02/14/23 0601  NA 140 140  --  140  --  140 139  K 3.5 3.7  --  3.7  --  3.6 3.6  CL 106 108  --  109  --  101 107  CO2 23 22  --  24  --  24 23  GLUCOSE 116* 125*  --  143*  --  117* 121*  BUN 6* 9  --  21  --  13 17  CREATININE 0.66 0.93  --  1.06*  --  0.76 0.66  CALCIUM 8.8* 8.5*  --  8.3*  --  8.5* 8.1*  MG  --  2.0 2.1 2.1 2.0 2.1 2.1  PHOS 3.1 3.6 4.4 4.0 2.9 3.2 3.1    CBC: Recent Labs  Lab 02/08/23 0213 02/09/23 0419 02/10/23 0444 02/11/23 0456 02/12/23 1621 02/12/23 2232 02/13/23 0634 02/13/23 1729 02/14/23 0601  WBC 8.2 7.2 7.1   < > 8.7 9.0 10.3 10.7* 8.8  NEUTROABS 4.4 3.6 4.0  --   --   --   --   --   --   HGB 14.1 13.7 14.3   < > 7.9* 9.4* 11.3* 11.4* 11.4*  HCT 40.9 40.0 41.0   < > 23.1* 26.8* 33.4* 33.0* 32.8*  MCV 89.1 91.1 91.3   < > 92.4 89.9 86.5 89.7 88.2  PLT 106* 99* 105*   < > 130* 134* 137* 144* 165   < > = values in this interval not displayed.      Coagulation Studies: No results for input(s): "LABPROT", "INR" in the last 72 hours.  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     Focal electrographic status epilepticus, resolved ICH, POA Acute encephalopathy - Seizures most likely secondary to underlying ICH - Status epilepticus has resolved.  EEG was also improved.  However patient continues to be encephalopathic.  This could be secondary to medications, delirium  Recommendations -Will reduce Keppra to 1500 mg twice daily -Continue Vimpat 200 mg twice daily -Was briefly started on Depakote.  As EEG is improving, will DC for now -DC LTM EEG has no seizures overnight -Continue seizure precautions, delirium precautions -As needed IV Versed for  clinical seizures lasting more than 2 minutes -Discussed plan with Dr. Randol Kern via secure chat, called and updated daughter via phone   I have spent a total of  36 minutes with the patient reviewing hospital notes,  test results, labs and examining the patient as well as establishing an assessment and plan.  > 50% of time was spent in direct patient care.      Lindie Spruce Epilepsy [facility] For questions after 5pm please refer to AMION to reach the Neurologist on call

## 2023-02-14 NOTE — Progress Notes (Signed)
Speech Language Pathology Treatment: Dysphagia;Cognitive-Linquistic  Patient Details Name: Selena Branch MRN: [id number] DOB: March 01, 1941 Today's Date: 02/14/2023 Time: 1112-1120 SLP Time Calculation (min) (ACUTE ONLY): 8 min  Assessment / Plan / Recommendation Clinical Impression  Pt more confused today than in previously documented sessions with SLP. Pt keeps eyes closed, ineffectively pulls at restraints. Says she needs to stop the bleeding and cannot be redirected. When offered sips from a straw or bites of puree from a spoon she will accept a few then refuse. She is capable of solids when/if alert. But mostly is too altered to eat meals and is reliant on Cortrak for meds and nutrition. Will f/u.   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  Head CT and brain MRI were negative for acute abnormalities.  She has no previous history of seizures, no family history of seizures, no history of brain surgery, no history of meningitis or encephalitis, and history of 1 fall in which she hit her head and had to have stitches but no other serious head injuries.  At baseline, she is alert and oriented, able to do all of her ADLs and cares for her husband.      SLP Plan  Continue with current plan of care      Recommendations for follow up therapy are one component of a multi-disciplinary discharge planning process, led by the attending physician.  Recommendations may be updated based on patient status, additional functional criteria and insurance authorization.    Recommendations  Diet recommendations: Regular;Thin liquid Liquids provided via: Cup;Straw Medication Administration: Whole meds with liquid Supervision: Patient able to self feed;Full supervision/cueing for compensatory strategies Compensations: Minimize environmental distractions;Slow rate;Small  sips/bites Postural Changes and/or Swallow Maneuvers: Seated upright 90 degrees                  Oral care BID   Frequent or constant Supervision/Assistance Cognitive communication deficit (R41.841);Dysphagia, unspecified (R13.10)     Continue with current plan of care     Selena Branch, Riley Nearing  02/14/2023, 12:42 PM

## 2023-02-14 NOTE — Progress Notes (Signed)
PROGRESS NOTE                                                                                                                                                                                                             Patient Demographics:    Selena Branch, is a 82 y.o. female, DOB - 11/24/1940, UVO:[id number]  Outpatient Primary MD for the patient is Gweneth Dimitri, MD    LOS - 9  Admit date - 02/04/2023    Chief Complaint  Patient presents with   Code Stroke       Brief Narrative (HPI from H&P)     82 y.o. female with medical history significant of CLL currently in remission, glaucoma, hypertension, thrombocytopenia, hyperlipidemia presenting to the ED with sudden onset altered mental status/aphasia, this happened while she was on the phone at home, she was doing fine but suddenly became unresponsive and confused.  She was brought to the ER where she was seen by neurology and admitted for evaluation of expressive aphasia and possible stroke workup.  Her workup was significant for status epilepticus, as well her mentation complicated by hospital delerium, she did develop acute blood loss anemia related to aminal wall hematoma and hemoperitoneum.   Subjective:   No significant events overnight as discussed with staff, she is confused, unable to provide reliable complaints.     Assessment  & Plan :   Metabolic encephalopathy, expressive aphasia.  Due to status epilepticus,  - MRI brain, CTA head and neck unremarkable -Management per neurology,  on LTM EEG, this has been discontinued 5/30 as she pulled her leads multiple times. -AED management per neurology, Keppra dose has been increased, started on Vimpat, as well she has been started on clonazepam , this has which is being uptitrated slowly and gradually which should help with both agitation and seizures.  -Seizure activity arising from left hemisphere, parietal lobe, this  is the area of her previous ICH . -Clonazepam has been discontinued due to increased lethargy -Back on LTM EEG overnight due to concern of seizures , AED management per neurology, continue with Vimpat and Keppra, Depakote was added.  Blood loss anemia due to abdominal wall hematoma and hemoperitoneum -Significant drop in hemoglobin from 14 baseline to 7.8, Hemoccult negative, CT abdomen pelvis significant for abdominal wall hematoma with hemoperitoneum. -Thanks heparin has been discontinued. -  Continue with supportive PRBC transfusion, she received 2 units of irradiated PRBC, hemoglobin remained stable since transfusion.   -Hematology  input greatly appreciated, continue with [facility] delirium -Continue with Seroquel -Klonopin has been discontinued due to increased lethargy  Glaucoma    - resume home eyedrops.   Hypertension   -It is on the lower side, will DC Klonopin, continue with Coreg, she remains on hydralazine.  Hyperlipidemia  - on statin    CLL/thrombocytopenia  - Stable.  Outpatient follow-up with oncology.  History of ITP, continue with Promacta   History of intracerebral hemorrhage  - Stable.  Urinary retention with possible UTI.   - Foley placed, Flomax, 3 days of Rocephin.  Can attempt voiding trial when she is more appropriate and ambulatory  Hypokalemia.   Replaced.  on Silvadene ointment at site of right forehead erythema      Condition - Extremely Guarded  Family Communication  : Discussed with daughter, husband at bedside on 6/2  Code Status :  DNR  Consults  :  Neuro  PUD Prophylaxis : PPI   Procedures  :     MRI brain, CTA head and neck.  No stroke, no significant occlusion in any vessel.  EEG.  Positive for possible status epilepticus.      Disposition Plan  :    Status is: Inpatient  DVT Prophylaxis  :    Place and maintain sequential compression device Start: 02/14/23 0709 SCDs Start: 02/04/23 1729    Lab Results   Component Value Date   PLT 165 02/14/2023    Diet :  Diet Order             Diet regular Room service appropriate? No; Fluid consistency: Thin  Diet effective now                    Inpatient Medications  Scheduled Meds:  amLODipine  10 mg Per Tube QHS   atorvastatin  40 mg Per Tube Daily   carvedilol  3.125 mg Per Tube BID WC   chlorhexidine gluconate (MEDLINE KIT)  15 mL Mouth Rinse BID   Chlorhexidine Gluconate Cloth  6 each Topical Daily   dorzolamide-timolol  1 drop Both Eyes Daily   eltrombopag  25 mg Oral Q M,W,F   feeding supplement (PROSource TF20)  60 mL Per Tube Daily   free water  130 mL Per Tube Q4H   furosemide  20 mg Intravenous Once   hydrALAZINE  25 mg Per Tube Q6H   latanoprost  1 drop Both Eyes QHS   multivitamin with minerals  1 tablet Per Tube Daily   mouth rinse  15 mL Mouth Rinse 10 times per day   pantoprazole (PROTONIX) IV  40 mg Intravenous Q12H   potassium chloride  40 mEq Oral Once   QUEtiapine  25 mg Per Tube Q24H   sertraline  25 mg Per Tube Daily   silver sulfADIAZINE   Topical BID   sodium chloride flush  3 mL Intravenous Q12H   tamsulosin  0.4 mg Oral Daily   thiamine  100 mg Per Tube Daily   Continuous Infusions:  feeding supplement (JEVITY 1.5 CAL/FIBER) 40 mL/hr at 02/13/23 1920   lacosamide (VIMPAT) IV 200 mg (02/14/23 1039)   levETIRAcetam 2,000 mg (02/14/23 0045)   PRN Meds:.acetaminophen **OR** acetaminophen, hydrALAZINE, midazolam, ondansetron **OR** ondansetron (ZOFRAN) IV, polyethylene glycol, sorbitol  Antibiotics  :    Anti-infectives (From admission, onward)    Start  Dose/Rate Route Frequency Ordered Stop   02/08/23 0600  cefTRIAXone (ROCEPHIN) 1 g in sodium chloride 0.9 % 100 mL IVPB        1 g 200 mL/hr over 30 Minutes Intravenous Every 24 hours 02/07/23 0600 02/09/23 0547   02/07/23 0700  cefTRIAXone (ROCEPHIN) 1 g in sodium chloride 0.9 % 100 mL IVPB        1 g 200 mL/hr over 30 Minutes Intravenous   Once 02/07/23 0601 02/07/23 0645         Objective:   Vitals:   02/14/23 0000 02/14/23 0014 02/14/23 0400 02/14/23 0825  BP: 126/62  (!) 149/59   Pulse: 84  93 89  Resp:      Temp: 97.9 F (36.6 C)  97.7 F (36.5 C) 98.4 F (36.9 C)  TempSrc:   Oral Axillary  SpO2: 92% 96% 92% 97%  Weight:        Wt Readings from Last 3 Encounters:  02/13/23 49.9 kg  10/29/22 48.3 kg  07/30/22 48.8 kg     Intake/Output Summary (Last 24 hours) at 02/14/2023 1141 Last data filed at 02/14/2023 0900 Gross per 24 hour  Intake 2268.6 ml  Output 350 ml  Net 1918.6 ml     Physical Exam  More awake and interactive today, but remains confused.  And repetitive.   Symmetrical Chest wall movement, Good air movement bilaterally, CTAB RRR,No Gallops,Rubs or new Murmurs, No Parasternal Heave +ve B.Sounds, Abd Soft, No tenderness, No rebound - guarding or rigidity. No Cyanosis, Clubbing or edema, No new Rash or bruise         Data Review:    Recent Labs  Lab 02/08/23 0213 02/09/23 0419 02/10/23 0444 02/11/23 0456 02/12/23 1621 02/12/23 2232 02/13/23 0634 02/13/23 1729 02/14/23 0601  WBC 8.2 7.2 7.1   < > 8.7 9.0 10.3 10.7* 8.8  HGB 14.1 13.7 14.3   < > 7.9* 9.4* 11.3* 11.4* 11.4*  HCT 40.9 40.0 41.0   < > 23.1* 26.8* 33.4* 33.0* 32.8*  PLT 106* 99* 105*   < > 130* 134* 137* 144* 165  MCV 89.1 91.1 91.3   < > 92.4 89.9 86.5 89.7 88.2  MCH 30.7 31.2 31.8   < > 31.6 31.5 29.3 31.0 30.6  MCHC 34.5 34.3 34.9   < > 34.2 35.1 33.8 34.5 34.8  RDW 12.7 12.8 12.9   < > 13.5 14.7 16.2* 16.4* 16.4*  LYMPHSABS 2.8 2.7 2.3  --   --   --   --   --   --   MONOABS 0.7 0.6 0.7  --   --   --   --   --   --   EOSABS 0.3 0.3 0.1  --   --   --   --   --   --   BASOSABS 0.0 0.0 0.0  --   --   --   --   --   --    < > = values in this interval not displayed.    Recent Labs  Lab 02/08/23 0213 02/09/23 0419 02/10/23 0444 02/11/23 0456 02/11/23 1623 02/12/23 0529 02/12/23 1047 02/12/23 1620  02/13/23 0634 02/14/23 0601  NA 138 139 140 140  --  140  --   --  140 139  K 3.7 3.7 3.5 3.7  --  3.7  --   --  3.6 3.6  CL 108 105 106 108  --  109  --   --  101 107  CO2 21* 23 23 22   --  24  --   --  24 23  ANIONGAP 9 11 11 10   --  7  --   --  15 9  GLUCOSE 114* 101* 116* 125*  --  143*  --   --  117* 121*  BUN <5* 6* 6* 9  --  21  --   --  13 17  CREATININE 0.61 0.68 0.66 0.93  --  1.06*  --   --  0.76 0.66  AST  --   --   --   --   --   --  31  --   --   --   ALT  --   --   --   --   --   --  20  --   --   --   ALKPHOS  --   --   --   --   --   --  46  --   --   --   BILITOT  --   --   --   --   --   --  0.4  --   --   --   ALBUMIN  --   --   --   --   --   --  3.1*  --   --   --   AMMONIA  --   --  27  --   --   --   --   --   --   --   BNP 89.9 20.9  --   --   --   --   --   --   --   --   MG 2.0 2.0  --  2.0 2.1 2.1  --  2.0 2.1 2.1  CALCIUM 8.2* 8.5* 8.8* 8.5*  --  8.3*  --   --  8.5* 8.1*     Lab Results  Component Value Date   CHOL 139 02/05/2023   HDL 64 02/05/2023   LDLCALC 69 02/05/2023   TRIG 31 02/05/2023   CHOLHDL 2.2 02/05/2023    Lab Results  Component Value Date   HGBA1C 5.6 02/05/2023      Radiology Reports Overnight EEG with video  Result Date: 02/13/2023 Charlsie Quest, MD     02/14/2023 10:40 AM Patient Name: Selena Branch MRN: [id number] Epilepsy Attending: Charlsie Quest Referring Physician/Provider: Milon Dikes, MD Duration: 02/13/2023 0050 to 02/14/2023 0050  Patient history: 82 y.o. female with a past medical history of CLL, ITP, left parietal ICH in 2021 presenting with aphasia and generalized weakness. EEG to evaluate for seizure.  Level of alertness: awake. asleep  AEDs during EEG study: LEV, LCM  Technical aspects: This EEG study was done with scalp electrodes positioned according to the 10-20 International system of electrode placement. Electrical activity was reviewed with band pass filter of 1-70Hz , sensitivity of 7 uV/mm, display speed of  43mm/sec with a 60Hz  notched filter applied as appropriate. EEG data were recorded continuously and digitally stored.  Video monitoring was available and reviewed as appropriate.  Description:  No clear posterior dominant rhythm was seen. Sleep was characterized by sleep spindles (12 to 14 Hz), maximal frontocentral region.  EEG showed continuous generalized and lateralized left hemisphere 5-9Hz  theta-alpha activity admixed with intermittent 2-3hz  delta slowing. Sharp waves were also noted arising from left hemisphere.  Hyperventilation and photic stimulation were not performed.  ABNORMALITY -  Sharp waves, left hemisphere - Continuous slow, generalized and lateralized left hemisphere  IMPRESSION: This study showed evidence of epileptogenicity and  cortical dysfunction arising from left hemisphere likely secondary to underlying structural abnormality. Additionally there is moderate diffuse encephalopathy. No definite seizures were seen.  Charlsie Quest   EEG adult  Result Date: 02/13/2023 Charlsie Quest, MD     02/14/2023 10:39 AM Patient Name: Selena Branch MRN: [id number] Epilepsy Attending: Charlsie Quest Referring Physician/Provider: Caryl Pina, MD Duration: 31.32 mins  Patient history: 82 y.o. female with a past medical history of CLL, ITP, left parietal ICH in 2021 presenting with aphasia and generalized weakness. EEG to evaluate for seizure.  Level of alertness: awake  AEDs during EEG study: LEV, LCM  Technical aspects: This EEG study was done with scalp electrodes positioned according to the 10-20 International system of electrode placement. Electrical activity was reviewed with band pass filter of 1-70Hz , sensitivity of 7 uV/mm, display speed of 31mm/sec with a 60Hz  notched filter applied as appropriate. EEG data were recorded continuously and digitally stored.  Video monitoring was available and reviewed as appropriate.  Description:   No clear posterior dominant rhythm was seen. EEG showed  continuous generalized and lateralized left hemisphere 3 to 6 Hz theta-delta slowing. Frequent sharp waves were also noted arising from left hemisphere.  Hyperventilation and photic stimulation were not performed.  Patient was noted to have upward gaze deviation with mouth opening and closing repetitively at 2309 on 02/12/2023. Concomitant eeg before, during and after the event didn't show any eeg change to suggest seizure ABNORMALITY - Sharp waves, left hemisphere - Continuous slow, generalized and lateralized left hemisphere  IMPRESSION: This study showed evidence of epileptogenicity and  cortical dysfunction arising from left hemisphere likely secondary to underlying structural abnormality. Additionally there is moderate diffuse encephalopathy. No definite seizures were seen. On 02/12/2023 at 2309, patient was noted to have upward gaze deviation with mouth opening and closing repetitively without concomitant eeg change. This was most likely not an epileptic event.  Charlsie Quest   CT ABDOMEN PELVIS WO CONTRAST  Addendum Date: 02/13/2023   ADDENDUM REPORT: 02/13/2023 07:34 ADDENDUM: Study discussed by telephone with Dr. Huey Bienenstock on 02/13/2023 at 0732 hours. Electronically Signed   By: Odessa Fleming M.D.   On: 02/13/2023 07:34   Result Date: 02/13/2023 CLINICAL DATA:  82 year old female with blood loss anemia. Query bleeding source. EXAM: CT ABDOMEN AND PELVIS WITHOUT CONTRAST TECHNIQUE: Multidetector CT imaging of the abdomen and pelvis was performed following the standard protocol without IV contrast. RADIATION DOSE REDUCTION: This exam was performed according to the departmental dose-optimization program which includes automated exposure control, adjustment of the mA and/or kV according to patient size and/or use of iterative reconstruction technique. COMPARISON:  CT Chest, Abdomen, and Pelvis 12/01/2020. FINDINGS: Lower chest: No cardiomegaly. Small layering pleural effusions with associated lower lobe lung  opacity most resembling atelectasis. Hepatobiliary: Small layering gallstones. Negative noncontrast liver. Pancreas: Negative. Spleen: Negative. Adrenals/Urinary Tract: Negative adrenal glands, noncontrast kidneys. Foley catheter decompresses the urinary bladder, which is bordered by some pelvic hematoma. See additional details below. Stomach/Bowel: Enteric feeding tube terminates in the 2nd portion of the duodenum right upper quadrant series 3, image 25. No free air. No dilated large or small bowel loops. Nonspecific fluid containing large bowel from the right colon to the redundant splenic flexure. Decompressed descending colon with extensive diverticulosis. Redundant but decompressed sigmoid colon. However, there is a small volume of layering complex  fluid density in both pericolic gutters, on the right tracking up toward the inferior margin of the liver. But this is a relatively small volume compared to pelvis findings below. Vascular/Lymphatic: Normal caliber abdominal aorta. Aortoiliac calcified atherosclerosis. Vascular patency is not evaluated in the absence of IV contrast. No lymphadenopathy identified. Reproductive: Chronic left ovarian cyst with simple fluid density has not significantly changed since CT pelvis 08/31/2019 (no follow-up imaging recommended). Uterus remains in place. Other: Nonspecific mild to moderate presacral stranding or hyperdense fluid,, and this is probably layering presacral hematoma. However, there is heterogeneous enlargement of the lower right rectus muscle, primarily between the deep rectus and the peritoneal cavity. This extends into the deep pelvis anterior space of Retzius, and encompasses 42 x 71 x 103 mm (AP by transverse by CC) for an estimated hematoma volume there of 154 mL. Smaller volume additional pelvic sidewall hematoma also identified on the right series 3, image 66. 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 Spine  degeneration. No acute or suspicious osseous lesion. IMPRESSION: 1. Positive for evidence of Pelvic Hematoma, which might have originated from Intramuscular hemorrhage of the Right Lower Rectus Muscle. Deep lower right rectus muscle sheath hematoma is estimated at 154 mL, with evidence of additional scattered pelvic sidewall, space of Retzius, and presacral hematoma. 2. Furthermore, there is a small volume of paracolic gutter layering Hemoperitoneum suspected. But no evidence of bowel obstruction or bowel perforation. But nonspecific fluid in the proximal large bowel. Query red blood per rectum. 3. Enteric feeding tube placed into the proximal duodenum. Foley catheter decompresses the urinary bladder. 4. Small layering pleural effusions with lung base atelectasis. 5. Cholelithiasis.  Aortic Atherosclerosis (ICD10-I70.0). Electronically Signed: By: Odessa Fleming M.D. On: 02/13/2023 07:29   DG CHEST PORT 1 VIEW  Result Date: 02/13/2023 CLINICAL DATA:  82 year old female with rectus muscle/pelvis hematoma. Anemia. Shortness of breath. EXAM: PORTABLE CHEST 1 VIEW COMPARISON:  CT Abdomen and Pelvis 0700 hours today. FINDINGS: Portable AP view at 0511 hours. Enteric feeding tube courses to the abdomen. Small volume layering pleural effusions and lung base atelectasis better demonstrated by CT. Otherwise Allowing for portable technique the lungs are clear. Mediastinal contours are within normal limits. Calcified aortic atherosclerosis. Visualized tracheal air column is within normal limits. Negative visible bowel gas pattern. IMPRESSION: 1. Small layering pleural effusions and lung base by CT. 2. No other acute cardiopulmonary abnormality. Electronically Signed   By: Odessa Fleming M.D.   On: 02/13/2023 07:34   DG Abd Portable 1V  Result Date: 02/11/2023 CLINICAL DATA:  Feeding tube placement EXAM: PORTABLE ABDOMEN - 1 VIEW COMPARISON:  None Available. FINDINGS: Enteric tube tip is at the level of the gastric antrum/proximal  duodenal. No dilated bowel loops are seen. No fractures are identified. IMPRESSION: Enteric tube tip is at the level of the gastric antrum/proximal duodenal. Electronically Signed   By: Darliss Cheney M.D.   On: 02/11/2023 15:33      Signature  -   Huey Bienenstock M.D on 02/14/2023 at 11:41 AM   -  To page go to [email or web address]

## 2023-02-15 DIAGNOSIS — R410 Disorientation, unspecified: Secondary | ICD-10-CM

## 2023-02-15 DIAGNOSIS — D649 Anemia, unspecified: Secondary | ICD-10-CM | POA: Diagnosis not present

## 2023-02-15 DIAGNOSIS — S301XXS Contusion of abdominal wall, sequela: Secondary | ICD-10-CM | POA: Diagnosis not present

## 2023-02-15 DIAGNOSIS — R569 Unspecified convulsions: Secondary | ICD-10-CM | POA: Diagnosis not present

## 2023-02-15 DIAGNOSIS — C9110 Chronic lymphocytic leukemia of B-cell type not having achieved remission: Secondary | ICD-10-CM | POA: Diagnosis not present

## 2023-02-15 LAB — GLUCOSE, CAPILLARY
Glucose-Capillary: 100 mg/dL — ABNORMAL HIGH (ref 70–99)
Glucose-Capillary: 103 mg/dL — ABNORMAL HIGH (ref 70–99)
Glucose-Capillary: 113 mg/dL — ABNORMAL HIGH (ref 70–99)
Glucose-Capillary: 115 mg/dL — ABNORMAL HIGH (ref 70–99)
Glucose-Capillary: 124 mg/dL — ABNORMAL HIGH (ref 70–99)
Glucose-Capillary: 139 mg/dL — ABNORMAL HIGH (ref 70–99)

## 2023-02-15 LAB — BASIC METABOLIC PANEL
Anion gap: 10 (ref 5–15)
BUN: 13 mg/dL (ref 8–23)
CO2: 21 mmol/L — ABNORMAL LOW (ref 22–32)
Calcium: 8.4 mg/dL — ABNORMAL LOW (ref 8.9–10.3)
Chloride: 106 mmol/L (ref 98–111)
Creatinine, Ser: 0.62 mg/dL (ref 0.44–1.00)
GFR, Estimated: >60 mL/min (ref >60)
Glucose, Bld: 108 mg/dL — ABNORMAL HIGH (ref 70–99)
Potassium: 3.7 mmol/L (ref 3.5–5.1)
Sodium: 137 mmol/L (ref 135–145)

## 2023-02-15 LAB — CBC
HCT: 37.4 % (ref 36.0–46.0)
Hemoglobin: 12.6 g/dL (ref 12.0–15.0)
MCH: 29.9 pg (ref 26.0–34.0)
MCHC: 33.7 g/dL (ref 30.0–36.0)
MCV: 88.6 fL (ref 80.0–100.0)
Platelets: 203 10*3/uL (ref 150–400)
RBC: 4.22 MIL/uL (ref 3.87–5.11)
RDW: 16 % — ABNORMAL HIGH (ref 11.5–15.5)
WBC: 9.8 10*3/uL (ref 4.0–10.5)
nRBC: 0 % (ref 0.0–0.2)

## 2023-02-15 LAB — PHOSPHORUS: Phosphorus: 3.2 mg/dL (ref 2.5–4.6)

## 2023-02-15 LAB — MAGNESIUM: Magnesium: 2.2 mg/dL (ref 1.7–2.4)

## 2023-02-15 MED ORDER — ENSURE ENLIVE PO LIQD
237.0000 mL | Freq: Three times a day (TID) | ORAL | Status: DC
  Administered 2023-02-15 (×2): 237 mL via ORAL
  Administered 2023-02-16: 120 mL via ORAL
  Administered 2023-02-17 (×2): 237 mL via ORAL

## 2023-02-15 MED ORDER — LEVETIRACETAM IN NACL 1000 MG/100ML IV SOLN
1000.0000 mg | Freq: Two times a day (BID) | INTRAVENOUS | Status: DC
  Administered 2023-02-15 – 2023-02-16 (×2): 1000 mg via INTRAVENOUS
  Filled 2023-02-15 (×4): qty 100

## 2023-02-15 NOTE — Progress Notes (Signed)
When RN entered to check on patient. This Patient had already pulled out her cortrak tube and  IV line. MD notified.

## 2023-02-15 NOTE — Progress Notes (Signed)
Subjective: Following G-tube out.  However left more awake this morning.  Able to answer all my orientation questions although does often require redirecting, also able to name objects.  ROS: negative except above Examination  Vital signs in last 24 hours: Temp:  [97.7 F (36.5 C)-98.8 F (37.1 C)] 97.7 F (36.5 C) (06/04 0820) Pulse Rate:  [83-92] 83 (06/04 0820) Resp:  [16-20] 16 (06/04 0820) BP: (142-156)/(59-73) 142/60 (06/04 0820) SpO2:  [93 %-97 %] 93 % (06/04 0820)  General: lying in bed, NAD Neuro: Awake, alert, oriented to self, able to tell me the name of her daughter, thinks she is" [facility]", not oriented to time, did name objects and follow commands appropriately, PERRLA, EOMI, appears to have right hemineglect but difficult to confirm as patient keeps getting distracted, spontaneously moving all 4 extremities in bed  Basic Metabolic Panel: Recent Labs  Lab 02/11/23 0456 02/11/23 1623 02/12/23 0529 02/12/23 1620 02/13/23 0634 02/14/23 0601 02/15/23 0540  NA 140  --  140  --  140 139 137  K 3.7  --  3.7  --  3.6 3.6 3.7  CL 108  --  109  --  101 107 106  CO2 22  --  24  --  24 23 21*  GLUCOSE 125*  --  143*  --  117* 121* 108*  BUN 9  --  21  --  13 17 13   CREATININE 0.93  --  1.06*  --  0.76 0.66 0.62  CALCIUM 8.5*  --  8.3*  --  8.5* 8.1* 8.4*  MG 2.0   < > 2.1 2.0 2.1 2.1 2.2  PHOS 3.6   < > 4.0 2.9 3.2 3.1 3.2   < > = values in this interval not displayed.    CBC: Recent Labs  Lab 02/09/23 0419 02/10/23 0444 02/11/23 0456 02/13/23 0634 02/13/23 1729 02/14/23 0601 02/14/23 1743 02/15/23 0540  WBC 7.2 7.1   < > 10.3 10.7* 8.8 10.5 9.8  NEUTROABS 3.6 4.0  --   --   --   --   --   --   HGB 13.7 14.3   < > 11.3* 11.4* 11.4* 12.3 12.6  HCT 40.0 41.0   < > 33.4* 33.0* 32.8* 35.9* 37.4  MCV 91.1 91.3   < > 86.5 89.7 88.2 88.2 88.6  PLT 99* 105*   < > 137* 144* 165 203 203   < > = values in this interval not displayed.   Coagulation  Studies: No results for input(s): "LABPROT", "INR" in the last 72 hours.  Imaging No new brain imaging overnight     ASSESSMENT AND PLAN: 82 year old female with previous left temporal lobe and tiny right parietal ICH in the setting of ITP with platelets 5 presented with aphasia and EEG showed focal electrographic status epilepticus arising from left hemisphere, maximal left parietal region.     Focal electrographic status epilepticus, resolved ICH, POA Acute encephalopathy - Seizures most likely secondary to underlying ICH - Status epilepticus has resolved.  EEG was also improved.  However patient continues to be encephalopathic.  This could be secondary to medications, delirium   Recommendations -Will reduce Keppra to 1000 mg twice daily -Continue Vimpat 200 mg twice daily -Continue seizure precautions, delirium precautions -As needed IV Versed for clinical seizures lasting more than 2 minutes -Discussed plan with Dr. Randol Kern    I have spent a total of  35 minutes with the patient reviewing hospital notes,  test results, labs and examining the patient as well as establishing an assessment and plan.  > 50% of time was spent in direct patient care.    Lindie Spruce Epilepsy [facility] For questions after 5pm please refer to AMION to reach the Neurologist on call

## 2023-02-15 NOTE — Progress Notes (Addendum)
PROGRESS NOTE                                                                                                                                                                                                             Patient Demographics:    Selena Branch, is a 82 y.o. female, DOB - 1941/08/18, BJY:[id number]  Outpatient Primary MD for the patient is Gweneth Dimitri, MD    LOS - 10  Admit date - 02/04/2023    Chief Complaint  Patient presents with   Code Stroke       Brief Narrative (HPI from H&P)     82 y.o. female with medical history significant of CLL currently in remission, glaucoma, hypertension, thrombocytopenia, hyperlipidemia presenting to the ED with sudden onset altered mental status/aphasia, this happened while she was on the phone at home, she was doing fine but suddenly became unresponsive and confused.  She was brought to the ER where she was seen by neurology and admitted for evaluation of expressive aphasia and possible stroke workup.  Her workup was significant for status epilepticus, as well her mentation complicated by hospital delerium, she did develop acute blood loss anemia related to  rectus sheath hematoma and hemoperitoneum.   Subjective:   Patient pulled her IV and core Trak tube yesterday, she appears to be more awake today, she is still unable to provide any reliable complaints.     Assessment  & Plan :   Metabolic encephalopathy, expressive aphasia.  Due to status epilepticus,  - MRI brain, CTA head and neck unremarkable -Management per neurology,  on LTM EEG, this has been discontinued 5/30 as she pulled her leads multiple times. -AED management per neurology, blood dose has been lowered secondary to aphasia and increased confusion, continue with Vimpat, clonazepam has been discontinued to increase sleepiness as well, Depakote has been discontinued yesterday as well   -Seizure activity arising  from left hemisphere, parietal lobe, this is the area of her previous ICH . -Clonazepam has been discontinued due to increased lethargy  Hospital delirium - Patient with significant agitation, restlessness, at this point hospital delirium seems to be a major contributing factor on top of her initial encephalopathy related to seizures .  Blood loss anemia due to abdominal wall hematoma and hemoperitoneum -Significant drop in hemoglobin from 14 baseline to 7.8, Hemoccult negative,  CT abdomen pelvis significant for abdominal wall hematoma with hemoperitoneum. -[place]  heparin has been discontinued. -Continue with supportive PRBC transfusion, she received 2 units of irradiated PRBC, hemoglobin remained stable since transfusion.   -Hematology  input greatly appreciated, continue with [facility] delirium -Continue with Seroquel -Klonopin has been discontinued due to increased lethargy  Glaucoma    - resume home eyedrops.   Hypertension   -It is on the lower side, will DC Klonopin, continue with Coreg, she remains on hydralazine.  Hyperlipidemia  - on statin    CLL/thrombocytopenia  - Stable.  Outpatient follow-up with oncology.  History of ITP, continue with Promacta   History of intracerebral hemorrhage  - Stable.  Urinary retention with possible UTI.   - Foley placed, Flomax, 3 days of Rocephin.  Can attempt voiding trial when she is more appropriate and ambulatory  Hypokalemia.   Replaced.      Condition - Extremely Guarded  Family Communication  : Discussing with daughter and husband at bedside daily  Code Status :  DNR  Consults  :  Neuro  PUD Prophylaxis : PPI   Procedures  :     MRI brain, CTA head and neck.  No stroke, no significant occlusion in any vessel.  EEG.  Positive for possible status epilepticus.      Disposition Plan  :    Status is: Inpatient  DVT Prophylaxis  :    Place and maintain sequential compression device Start: 02/14/23 0709 SCDs  Start: 02/04/23 1729    Lab Results  Component Value Date   PLT 203 02/15/2023    Diet :  Diet Order             Diet regular Room service appropriate? No; Fluid consistency: Thin  Diet effective now                    Inpatient Medications  Scheduled Meds:  amLODipine  10 mg Per Tube QHS   atorvastatin  40 mg Per Tube Daily   carvedilol  3.125 mg Per Tube BID WC   chlorhexidine gluconate (MEDLINE KIT)  15 mL Mouth Rinse BID   Chlorhexidine Gluconate Cloth  6 each Topical Daily   dorzolamide-timolol  1 drop Both Eyes Daily   eltrombopag  25 mg Oral Q M,W,F   feeding supplement  237 mL Oral TID BM   furosemide  20 mg Intravenous Once   hydrALAZINE  25 mg Per Tube Q6H   latanoprost  1 drop Both Eyes QHS   multivitamin with minerals  1 tablet Per Tube Daily   mouth rinse  15 mL Mouth Rinse 10 times per day   pantoprazole (PROTONIX) IV  40 mg Intravenous Q12H   potassium chloride  40 mEq Oral Once   QUEtiapine  25 mg Per Tube Q24H   sertraline  25 mg Per Tube Daily   silver sulfADIAZINE   Topical BID   sodium chloride flush  3 mL Intravenous Q12H   tamsulosin  0.4 mg Oral Daily   Continuous Infusions:  lacosamide (VIMPAT) IV 200 mg (02/15/23 0951)   levETIRAcetam     PRN Meds:.acetaminophen **OR** acetaminophen, hydrALAZINE, midazolam, ondansetron **OR** ondansetron (ZOFRAN) IV, polyethylene glycol, sorbitol  Antibiotics  :    Anti-infectives (From admission, onward)    Start     Dose/Rate Route Frequency Ordered Stop   02/08/23 0600  cefTRIAXone (ROCEPHIN) 1 g in sodium chloride 0.9 % 100 mL IVPB  1 g 200 mL/hr over 30 Minutes Intravenous Every 24 hours 02/07/23 0600 02/09/23 0547   02/07/23 0700  cefTRIAXone (ROCEPHIN) 1 g in sodium chloride 0.9 % 100 mL IVPB        1 g 200 mL/hr over 30 Minutes Intravenous  Once 02/07/23 0601 02/07/23 0645         Objective:   Vitals:   02/14/23 2307 02/15/23 0411 02/15/23 0820 02/15/23 1217  BP: (!) 156/67  (!) 153/59 (!) 142/60 (!) 111/52  Pulse: 86 92 83 84  Resp: 20 19 16 18   Temp: 98 F (36.7 C) 97.9 F (36.6 C) 97.7 F (36.5 C) 97.7 F (36.5 C)  TempSrc: Axillary Axillary Axillary Oral  SpO2: 94% 94% 93% 95%  Weight:        Wt Readings from Last 3 Encounters:  02/14/23 49.9 kg  10/29/22 48.3 kg  07/30/22 48.8 kg     Intake/Output Summary (Last 24 hours) at 02/15/2023 1221 Last data filed at 02/15/2023 0900 Gross per 24 hour  Intake 0 ml  Output 1600 ml  Net -1600 ml     Physical Exam  He is more awake and uncaring today, but she remains significantly confused and delirious and restless Symmetrical Chest wall movement, Good air movement bilaterally, CTAB RRR,No Gallops,Rubs or new Murmurs, No Parasternal Heave +ve B.Sounds, significant bruising in the lower abdomen and back area, please see picture below No Cyanosis, Clubbing or edema, No new Rash or bruise            Data Review:    Recent Labs  Lab 02/09/23 0419 02/10/23 0444 02/11/23 0456 02/13/23 0634 02/13/23 1729 02/14/23 0601 02/14/23 1743 02/15/23 0540  WBC 7.2 7.1   < > 10.3 10.7* 8.8 10.5 9.8  HGB 13.7 14.3   < > 11.3* 11.4* 11.4* 12.3 12.6  HCT 40.0 41.0   < > 33.4* 33.0* 32.8* 35.9* 37.4  PLT 99* 105*   < > 137* 144* 165 203 203  MCV 91.1 91.3   < > 86.5 89.7 88.2 88.2 88.6  MCH 31.2 31.8   < > 29.3 31.0 30.6 30.2 29.9  MCHC 34.3 34.9   < > 33.8 34.5 34.8 34.3 33.7  RDW 12.8 12.9   < > 16.2* 16.4* 16.4* 16.2* 16.0*  LYMPHSABS 2.7 2.3  --   --   --   --   --   --   MONOABS 0.6 0.7  --   --   --   --   --   --   EOSABS 0.3 0.1  --   --   --   --   --   --   BASOSABS 0.0 0.0  --   --   --   --   --   --    < > = values in this interval not displayed.    Recent Labs  Lab 02/09/23 0419 02/10/23 0444 02/11/23 0456 02/11/23 1623 02/12/23 0529 02/12/23 1047 02/12/23 1620 02/13/23 0634 02/14/23 0601 02/15/23 0540  NA 139 140 140  --  140  --   --  140 139 137  K 3.7 3.5 3.7  --  3.7   --   --  3.6 3.6 3.7  CL 105 106 108  --  109  --   --  101 107 106  CO2 23 23 22   --  24  --   --  24 23 21*  ANIONGAP 11 11 10   --  7  --   --  15 9 10   GLUCOSE 101* 116* 125*  --  143*  --   --  117* 121* 108*  BUN 6* 6* 9  --  21  --   --  13 17 13   CREATININE 0.68 0.66 0.93  --  1.06*  --   --  0.76 0.66 0.62  AST  --   --   --   --   --  31  --   --   --   --   ALT  --   --   --   --   --  20  --   --   --   --   ALKPHOS  --   --   --   --   --  46  --   --   --   --   BILITOT  --   --   --   --   --  0.4  --   --   --   --   ALBUMIN  --   --   --   --   --  3.1*  --   --   --   --   AMMONIA  --  27  --   --   --   --   --   --   --   --   BNP 20.9  --   --   --   --   --   --   --   --   --   MG 2.0  --  2.0   < > 2.1  --  2.0 2.1 2.1 2.2  CALCIUM 8.5* 8.8* 8.5*  --  8.3*  --   --  8.5* 8.1* 8.4*   < > = values in this interval not displayed.     Lab Results  Component Value Date   CHOL 139 02/05/2023   HDL 64 02/05/2023   LDLCALC 69 02/05/2023   TRIG 31 02/05/2023   CHOLHDL 2.2 02/05/2023    Lab Results  Component Value Date   HGBA1C 5.6 02/05/2023      Radiology Reports DG Abd Portable 1V  Result Date: 02/14/2023 CLINICAL DATA:  Feeding tube placement. EXAM: PORTABLE ABDOMEN - 1 VIEW COMPARISON:  Abdominopelvic CT yesterday FINDINGS: Tip of the weighted enteric tube is in the right upper quadrant, in the proximal duodenum on yesterday's CT. No dilatation of upper abdominal bowel loops. IMPRESSION: Tip of the weighted enteric tube in the proximal duodenum. Electronically Signed   By: Narda Rutherford M.D.   On: 02/14/2023 14:17   Overnight EEG with video  Result Date: 02/13/2023 Charlsie Quest, MD     02/14/2023 10:40 AM Patient Name: Selena Branch MRN: [id number] Epilepsy Attending: Charlsie Quest Referring Physician/Provider: Milon Dikes, MD Duration: 02/13/2023 0050 to 02/14/2023 0050  Patient history: 82 y.o. female with a past medical history of CLL, ITP, left  parietal ICH in 2021 presenting with aphasia and generalized weakness. EEG to evaluate for seizure.  Level of alertness: awake. asleep  AEDs during EEG study: LEV, LCM  Technical aspects: This EEG study was done with scalp electrodes positioned according to the 10-20 International system of electrode placement. Electrical activity was reviewed with band pass filter of 1-70Hz , sensitivity of 7 uV/mm, display speed of 54mm/sec with a 60Hz  notched filter applied as appropriate. EEG data were recorded continuously and digitally stored.  Video  monitoring was available and reviewed as appropriate.  Description:  No clear posterior dominant rhythm was seen. Sleep was characterized by sleep spindles (12 to 14 Hz), maximal frontocentral region.  EEG showed continuous generalized and lateralized left hemisphere 5-9Hz  theta-alpha activity admixed with intermittent 2-3hz  delta slowing. Sharp waves were also noted arising from left hemisphere.  Hyperventilation and photic stimulation were not performed.  ABNORMALITY - Sharp waves, left hemisphere - Continuous slow, generalized and lateralized left hemisphere  IMPRESSION: This study showed evidence of epileptogenicity and  cortical dysfunction arising from left hemisphere likely secondary to underlying structural abnormality. Additionally there is moderate diffuse encephalopathy. No definite seizures were seen.  Charlsie Quest   EEG adult  Result Date: 02/13/2023 Charlsie Quest, MD     02/14/2023 10:39 AM Patient Name: NORMANI BORNTREGER MRN: [id number] Epilepsy Attending: Charlsie Quest Referring Physician/Provider: Caryl Pina, MD Duration: 31.32 mins  Patient history: 82 y.o. female with a past medical history of CLL, ITP, left parietal ICH in 2021 presenting with aphasia and generalized weakness. EEG to evaluate for seizure.  Level of alertness: awake  AEDs during EEG study: LEV, LCM  Technical aspects: This EEG study was done with scalp electrodes positioned according to  the 10-20 International system of electrode placement. Electrical activity was reviewed with band pass filter of 1-70Hz , sensitivity of 7 uV/mm, display speed of 80mm/sec with a 60Hz  notched filter applied as appropriate. EEG data were recorded continuously and digitally stored.  Video monitoring was available and reviewed as appropriate.  Description:   No clear posterior dominant rhythm was seen. EEG showed continuous generalized and lateralized left hemisphere 3 to 6 Hz theta-delta slowing. Frequent sharp waves were also noted arising from left hemisphere.  Hyperventilation and photic stimulation were not performed.  Patient was noted to have upward gaze deviation with mouth opening and closing repetitively at 2309 on 02/12/2023. Concomitant eeg before, during and after the event didn't show any eeg change to suggest seizure ABNORMALITY - Sharp waves, left hemisphere - Continuous slow, generalized and lateralized left hemisphere  IMPRESSION: This study showed evidence of epileptogenicity and  cortical dysfunction arising from left hemisphere likely secondary to underlying structural abnormality. Additionally there is moderate diffuse encephalopathy. No definite seizures were seen. On 02/12/2023 at 2309, patient was noted to have upward gaze deviation with mouth opening and closing repetitively without concomitant eeg change. This was most likely not an epileptic event.  Charlsie Quest   CT ABDOMEN PELVIS WO CONTRAST  Addendum Date: 02/13/2023   ADDENDUM REPORT: 02/13/2023 07:34 ADDENDUM: Study discussed by telephone with Dr. Huey Bienenstock on 02/13/2023 at 0732 hours. Electronically Signed   By: Odessa Fleming M.D.   On: 02/13/2023 07:34   Result Date: 02/13/2023 CLINICAL DATA:  82 year old female with blood loss anemia. Query bleeding source. EXAM: CT ABDOMEN AND PELVIS WITHOUT CONTRAST TECHNIQUE: Multidetector CT imaging of the abdomen and pelvis was performed following the standard protocol without IV contrast.  RADIATION DOSE REDUCTION: This exam was performed according to the departmental dose-optimization program which includes automated exposure control, adjustment of the mA and/or kV according to patient size and/or use of iterative reconstruction technique. COMPARISON:  CT Chest, Abdomen, and Pelvis 12/01/2020. FINDINGS: Lower chest: No cardiomegaly. Small layering pleural effusions with associated lower lobe lung opacity most resembling atelectasis. Hepatobiliary: Small layering gallstones. Negative noncontrast liver. Pancreas: Negative. Spleen: Negative. Adrenals/Urinary Tract: Negative adrenal glands, noncontrast kidneys. Foley catheter decompresses the urinary bladder, which is bordered by some pelvic  hematoma. See additional details below. Stomach/Bowel: Enteric feeding tube terminates in the 2nd portion of the duodenum right upper quadrant series 3, image 25. No free air. No dilated large or small bowel loops. Nonspecific fluid containing large bowel from the right colon to the redundant splenic flexure. Decompressed descending colon with extensive diverticulosis. Redundant but decompressed sigmoid colon. However, there is a small volume of layering complex fluid density in both pericolic gutters, on the right tracking up toward the inferior margin of the liver. But this is a relatively small volume compared to pelvis findings below. Vascular/Lymphatic: Normal caliber abdominal aorta. Aortoiliac calcified atherosclerosis. Vascular patency is not evaluated in the absence of IV contrast. No lymphadenopathy identified. Reproductive: Chronic left ovarian cyst with simple fluid density has not significantly changed since CT pelvis 08/31/2019 (no follow-up imaging recommended). Uterus remains in place. Other: Nonspecific mild to moderate presacral stranding or hyperdense fluid,, and this is probably layering presacral hematoma. However, there is heterogeneous enlargement of the lower right rectus muscle, primarily  between the deep rectus and the peritoneal cavity. This extends into the deep pelvis anterior space of Retzius, and encompasses 42 x 71 x 103 mm (AP by transverse by CC) for an estimated hematoma volume there of 154 mL. Smaller volume additional pelvic sidewall hematoma also identified on the right series 3, image 66. 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 Spine degeneration. No acute or suspicious osseous lesion. IMPRESSION: 1. Positive for evidence of Pelvic Hematoma, which might have originated from Intramuscular hemorrhage of the Right Lower Rectus Muscle. Deep lower right rectus muscle sheath hematoma is estimated at 154 mL, with evidence of additional scattered pelvic sidewall, space of Retzius, and presacral hematoma. 2. Furthermore, there is a small volume of paracolic gutter layering Hemoperitoneum suspected. But no evidence of bowel obstruction or bowel perforation. But nonspecific fluid in the proximal large bowel. Query red blood per rectum. 3. Enteric feeding tube placed into the proximal duodenum. Foley catheter decompresses the urinary bladder. 4. Small layering pleural effusions with lung base atelectasis. 5. Cholelithiasis.  Aortic Atherosclerosis (ICD10-I70.0). Electronically Signed: By: Odessa Fleming M.D. On: 02/13/2023 07:29   DG CHEST PORT 1 VIEW  Result Date: 02/13/2023 CLINICAL DATA:  82 year old female with rectus muscle/pelvis hematoma. Anemia. Shortness of breath. EXAM: PORTABLE CHEST 1 VIEW COMPARISON:  CT Abdomen and Pelvis 0700 hours today. FINDINGS: Portable AP view at 0511 hours. Enteric feeding tube courses to the abdomen. Small volume layering pleural effusions and lung base atelectasis better demonstrated by CT. Otherwise Allowing for portable technique the lungs are clear. Mediastinal contours are within normal limits. Calcified aortic atherosclerosis. Visualized tracheal air column is within normal limits. Negative visible bowel gas pattern.  IMPRESSION: 1. Small layering pleural effusions and lung base by CT. 2. No other acute cardiopulmonary abnormality. Electronically Signed   By: Odessa Fleming M.D.   On: 02/13/2023 07:34   DG Abd Portable 1V  Result Date: 02/11/2023 CLINICAL DATA:  Feeding tube placement EXAM: PORTABLE ABDOMEN - 1 VIEW COMPARISON:  None Available. FINDINGS: Enteric tube tip is at the level of the gastric antrum/proximal duodenal. No dilated bowel loops are seen. No fractures are identified. IMPRESSION: Enteric tube tip is at the level of the gastric antrum/proximal duodenal. Electronically Signed   By: Darliss Cheney M.D.   On: 02/11/2023 15:33      Signature  -   Huey Bienenstock M.D on 02/15/2023 at 12:21 PM   -  To page go to [email or web address]

## 2023-02-15 NOTE — Progress Notes (Signed)
Nutrition Follow-up  DOCUMENTATION CODES:  Non-severe (moderate) malnutrition in context of chronic illness  INTERVENTION:  Discontinue enteral nutrition as pt with no access. Recommend replacement if pt mentation does not improve and PO remains poor. (TF order below) Continue Multivitamin w/ minerals daily Ensure Enlive po TID, each supplement provides 350 kcal and 20 grams of protein. Encourage good PO intake  Ok for family to bring outside food  NUTRITION DIAGNOSIS:  Moderate Malnutrition related to chronic illness as evidenced by mild fat depletion, moderate muscle depletion. - Ongoing  GOAL:  Patient will meet greater than or equal to 90% of their needs - Ongoing   MONITOR:  PO intake, TF tolerance, I & O's, Labs  REASON FOR ASSESSMENT:  Consult Enteral/tube feeding initiation and management  ASSESSMENT:  82 y.o. female presented to the ED with AMS/aphasia. PMH includes HTN, HLD, CLL, and glaucoma. Pt admitted with possible seizures.   5/24 - admitted 5/27 - diet advanced to full liquids 5/30 - diet advanced to regular 5/31 - Cortrak placed (tip gastric/proximal duodenal) 6/04 - Cortrak pulled  Discussed with MD, pt pulled tube last night. Mentation has improved since last week. Will follow for possible replacement of tube if needed. Discussed with RN. Pt laying in bed, still confused, unable to chat with RD. RD observed breakfast tray in room, untouched.   If replacement of tube is desired recommend, Tube Feeds via Cortrak: Jevity 1.5 at 40 mL/hr (960 mL/day) 60 mL ProSource TF20 - daily Free water flush: 130 mL q4h Tube Feeds at goal provides 1520 kcal, 81 gm protein, and 1510 mL total free water daily.  Meal Intake 6/03 - 0% x 2 meals  Medications reviewed and include: Protonix, MVI Labs reviewed: Sodium 140, Potassium 3.7, Phosphorus 3.6, Magnesium 2.0  Diet Order:   Diet Order             Diet regular Room service appropriate? No; Fluid consistency:  Thin  Diet effective now                  EDUCATION NEEDS:   Not appropriate for education at this time  Skin:  Skin Assessment: Reviewed RN Assessment  Last BM:  6/2 - Type 6  Height:  Ht Readings from Last 1 Encounters:  05/29/21 4\' 10"  (1.473 m)   Weight:  Wt Readings from Last 1 Encounters:  02/14/23 49.9 kg   BMI:  Body mass index is 22.99 kg/m.  Estimated Nutritional Needs:  Kcal:  1400-1600 Protein:  70-90 grams Fluid:  >/= 1.5 L   Kirby Crigler RD, LDN Clinical Dietitian See [facility] for contact information.

## 2023-02-15 NOTE — Progress Notes (Signed)
Occupational Therapy Treatment Patient Details Name: Selena Branch MRN: [id number] DOB: 10-11-1940 Today's Date: 02/15/2023   History of present illness Selena Branch is a 82 y.o. female admitted to [facility] on 5/24 with sudden onset altered mental status/aphasia. WGN:[id number]; EEG: positive for seizures. PMHx: CLL currently in remission, glaucoma, hypertension, thrombocytopenia, hyperlipidemia VHQ:IONGEXBM; EEG: positive for seizures.   OT comments  Pt progressing well toward therapy goals demonstrating improved functional transfers and mobility this session. Upon arrival, pt attempting to get out of bed with bilat soft wrist restraints donned and no clothing on. Pt perseverating on getting into fight, was redirectable with max cues. Pt limited primarily due to cognition this session with pt demonstrating poor insight, memory, safety awareness and attention requiring max verbal/tactile/visual cues to redirect. Pt completed STS transfer from EOB with mod A +2 via bilat HHA. Pt performed room/hallway level mobility using RW with min A +2 and max cues for environmental navigation, obstacle and RW management. Pt completes self-feeding tasks at bed level with setup A and increased time secondary to BUE tremors. Pt would continue to benefit from acute OT services to progress towards therapy goals and facilitate transition to skilled inpatient follow up therapy, <3 hours/day.    Recommendations for follow up therapy are one component of a multi-disciplinary discharge planning process, led by the attending physician.  Recommendations may be updated based on patient status, additional functional criteria and insurance authorization.    Assistance Recommended at Discharge Frequent or constant Supervision/Assistance  Patient can return home with the following  Two people to help with walking and/or transfers;A lot of help with bathing/dressing/bathroom;Direct supervision/assist for financial management;Direct  supervision/assist for medications management;Assist for transportation;Assistance with cooking/housework;Help with stairs or ramp for entrance;Assistance with feeding   Equipment Recommendations  Other (comment) (defer)    Recommendations for Other Services      Precautions / Restrictions Precautions Precautions: Fall Precaution Comments: seizure Restrictions Weight Bearing Restrictions: No       Mobility Bed Mobility Overal bed mobility: Needs Assistance Bed Mobility: Supine to Sit, Sit to Supine     Supine to sit: Min guard, HOB elevated Sit to supine: Min guard, HOB elevated   General bed mobility comments: min guard A for safety, HOB elevated, cues to initiate task    Transfers Overall transfer level: Needs assistance Equipment used: 2 person hand held assist Transfers: Sit to/from Stand Sit to Stand: Mod assist, +2 physical assistance, +2 safety/equipment           General transfer comment: STS from EOB with mod A +2 via bilat HHA and max cues for task initiation. Room/hallway level mobility using RW with min A +2 and max verbal/tactile/visual cues for attending to obstacles, navigating environment, and management of RW.     Balance Overall balance assessment: Needs assistance Sitting-balance support: Single extremity supported, Feet supported Sitting balance-Leahy Scale: Fair Sitting balance - Comments: sitting EOB   Standing balance support: Bilateral upper extremity supported, Reliant on assistive device for balance Standing balance-Leahy Scale: Poor Standing balance comment: BUE support on RW                           ADL either performed or assessed with clinical judgement   ADL Overall ADL's : Needs assistance/impaired Eating/Feeding: Set up;Bed level Eating/Feeding Details (indicate cue type and reason): minimal food spillage noted secondary to BUE tremors Grooming: Wash/dry face;Standing;Minimal assistance;Cueing for safety;Cueing for  sequencing Grooming Details (indicate cue  type and reason): standing sinkside, max verbal/visual cues for locating grooming items         Upper Body Dressing : Moderate assistance;Bed level Upper Body Dressing Details (indicate cue type and reason): mod A for threading BUEs, limited by cognition Lower Body Dressing: Moderate assistance;Sitting/lateral leans;Cueing for sequencing Lower Body Dressing Details (indicate cue type and reason): sitting EOB, requires max verbal/tactile/visual cues for sequencing task Toilet Transfer: Moderate assistance;+2 for physical assistance;+2 for safety/equipment;Regular Toilet;Rolling walker (2 wheels) Toilet Transfer Details (indicate cue type and reason): simulated, max verbal cues for sequencing transfer         Functional mobility during ADLs: Moderate assistance;+2 for physical assistance;+2 for safety/equipment;Cueing for sequencing;Cueing for safety;Rolling walker (2 wheels) General ADL Comments: limited by BUE tremors generalized weakness, impaired attention, safety awareness, sequencing and memory    Extremity/Trunk Assessment Upper Extremity Assessment Upper Extremity Assessment: Generalized weakness (BUE tremors noted)   Lower Extremity Assessment Lower Extremity Assessment: Defer to PT evaluation        Vision   Vision Assessment?: No apparent visual deficits Additional Comments: Pt demonstrating appropriate visual tracking of therapist. Requires max cues to locate objects/obstacles.   Perception Perception Perception: Not tested   Praxis Praxis Praxis: Not tested    Cognition Arousal/Alertness: Awake/alert Behavior During Therapy: Restless Overall Cognitive Status: Impaired/Different from baseline Area of Impairment: Orientation, Attention, Memory, Following commands, Safety/judgement, Awareness, Problem solving                   Current Attention Level: Focused Memory: Decreased recall of precautions, Decreased  short-term memory Following Commands: Follows one step commands inconsistently, Follows one step commands with increased time Safety/Judgement: Decreased awareness of safety, Decreased awareness of deficits Awareness: Intellectual Problem Solving: Slow processing, Difficulty sequencing, Requires verbal cues, Requires tactile cues General Comments: Pt restless, attempting to get out of bed with BUE wrist restraints donned and no clothing on upon arrival. Pt with limited insight, safety awareness, orientation and attention. Pt requiring max verbal/visual/tactile cues for redirection.        Exercises      Shoulder Instructions       General Comments VSS on RA    Pertinent Vitals/ Pain       Pain Assessment Pain Assessment: Faces Faces Pain Scale: Hurts a little bit Pain Location: discomfort with bilat soft wrist restraints Pain Descriptors / Indicators: Grimacing, Discomfort Pain Intervention(s): Limited activity within patient's tolerance, Monitored during session  Home Living                                          Prior Functioning/Environment              Frequency  Min 2X/week        Progress Toward Goals  OT Goals(current goals can now be found in the care plan section)  Progress towards OT goals: Progressing toward goals  Acute Rehab OT Goals Patient Stated Goal: to eat OT Goal Formulation: With patient Time For Goal Achievement: 02/21/23 Potential to Achieve Goals: Fair ADL Goals Pt Will Perform Grooming: with supervision;standing Pt Will Transfer to Toilet: with supervision;ambulating;regular height toilet Additional ADL Goal #1: Pt will navigate environment using LRAD with min verbal cues for safety/obstacle management Additional ADL Goal #2: Pt will attend to 2-step ADL task for 3 minutes with min verbal cues for redirection  Plan Discharge plan remains appropriate;Frequency remains  appropriate    Co-evaluation                  AM-PAC OT "6 Clicks" Daily Activity     Outcome Measure   Help from another person eating meals?: A Little Help from another person taking care of personal grooming?: A Little Help from another person toileting, which includes using toliet, bedpan, or urinal?: A Lot Help from another person bathing (including washing, rinsing, drying)?: A Lot Help from another person to put on and taking off regular upper body clothing?: A Lot Help from another person to put on and taking off regular lower body clothing?: A Lot 6 Click Score: 14    End of Session Equipment Utilized During Treatment: Gait belt;Rolling walker (2 wheels)  OT Visit Diagnosis: Other abnormalities of gait and mobility (R26.89);Other symptoms and signs involving cognitive function;Cognitive communication deficit (R41.841)   Activity Tolerance Patient tolerated treatment well   Patient Left in bed;with call bell/phone within reach;with bed alarm set;with restraints reapplied   Nurse Communication Mobility status        Time: 7829-5621 OT Time Calculation (min): 27 min  Charges: OT General Charges $OT Visit: 1 Visit OT Treatments $Self Care/Home Management : 8-22 mins $Therapeutic Activity: 8-22 mins  Sherley Bounds, OTS Acute Rehabilitation Services Office [phone] Secure Chat Communication Preferred   Sherley Bounds 02/15/2023, 5:13 PM

## 2023-02-16 ENCOUNTER — Inpatient Hospital Stay (HOSPITAL_COMMUNITY): Payer: PPO

## 2023-02-16 DIAGNOSIS — C9110 Chronic lymphocytic leukemia of B-cell type not having achieved remission: Secondary | ICD-10-CM | POA: Diagnosis not present

## 2023-02-16 DIAGNOSIS — R569 Unspecified convulsions: Secondary | ICD-10-CM | POA: Diagnosis not present

## 2023-02-16 DIAGNOSIS — R4701 Aphasia: Secondary | ICD-10-CM | POA: Diagnosis not present

## 2023-02-16 DIAGNOSIS — E785 Hyperlipidemia, unspecified: Secondary | ICD-10-CM | POA: Diagnosis not present

## 2023-02-16 LAB — CBC
HCT: 35.4 % — ABNORMAL LOW (ref 36.0–46.0)
Hemoglobin: 11.9 g/dL — ABNORMAL LOW (ref 12.0–15.0)
MCH: 30.5 pg (ref 26.0–34.0)
MCHC: 33.6 g/dL (ref 30.0–36.0)
MCV: 90.8 fL (ref 80.0–100.0)
Platelets: 212 10*3/uL (ref 150–400)
RBC: 3.9 MIL/uL (ref 3.87–5.11)
RDW: 16.3 % — ABNORMAL HIGH (ref 11.5–15.5)
WBC: 9.7 10*3/uL (ref 4.0–10.5)
nRBC: 0 % (ref 0.0–0.2)

## 2023-02-16 LAB — PHOSPHORUS: Phosphorus: 3.4 mg/dL (ref 2.5–4.6)

## 2023-02-16 LAB — GLUCOSE, CAPILLARY
Glucose-Capillary: 131 mg/dL — ABNORMAL HIGH (ref 70–99)
Glucose-Capillary: 132 mg/dL — ABNORMAL HIGH (ref 70–99)
Glucose-Capillary: 126 mg/dL — ABNORMAL HIGH (ref 70–99)
Glucose-Capillary: 127 mg/dL — ABNORMAL HIGH (ref 70–99)
Glucose-Capillary: 123 mg/dL — ABNORMAL HIGH (ref 70–99)
Glucose-Capillary: 147 mg/dL — ABNORMAL HIGH (ref 70–99)

## 2023-02-16 LAB — BASIC METABOLIC PANEL
Anion gap: 12 (ref 5–15)
BUN: 20 mg/dL (ref 8–23)
CO2: 19 mmol/L — ABNORMAL LOW (ref 22–32)
Calcium: 8.4 mg/dL — ABNORMAL LOW (ref 8.9–10.3)
Chloride: 107 mmol/L (ref 98–111)
Creatinine, Ser: 0.65 mg/dL (ref 0.44–1.00)
GFR, Estimated: >60 mL/min (ref >60)
Glucose, Bld: 140 mg/dL — ABNORMAL HIGH (ref 70–99)
Potassium: 3.5 mmol/L (ref 3.5–5.1)
Sodium: 138 mmol/L (ref 135–145)

## 2023-02-16 LAB — MAGNESIUM: Magnesium: 2.2 mg/dL (ref 1.7–2.4)

## 2023-02-16 MED ORDER — SODIUM CHLORIDE 0.9 % IV SOLN
750.0000 mg | Freq: Two times a day (BID) | INTRAVENOUS | Status: DC
  Administered 2023-02-16: 750 mg via INTRAVENOUS
  Filled 2023-02-16 (×3): qty 7.5

## 2023-02-16 MED ORDER — FREE WATER
130.0000 mL | Status: DC
  Administered 2023-02-16 – 2023-02-17 (×5): 130 mL

## 2023-02-16 MED ORDER — QUETIAPINE FUMARATE 50 MG PO TABS
50.0000 mg | ORAL_TABLET | ORAL | Status: DC
  Administered 2023-02-16 – 2023-02-17 (×2): 50 mg
  Filled 2023-02-16 (×2): qty 1

## 2023-02-16 MED ORDER — JEVITY 1.5 CAL/FIBER PO LIQD
1000.0000 mL | ORAL | Status: DC
  Administered 2023-02-16: 1000 mL
  Filled 2023-02-16 (×2): qty 1000

## 2023-02-16 MED ORDER — HALOPERIDOL LACTATE 5 MG/ML IJ SOLN
2.0000 mg | Freq: Four times a day (QID) | INTRAMUSCULAR | Status: DC | PRN
  Administered 2023-02-16 – 2023-02-20 (×4): 2 mg via INTRAVENOUS
  Filled 2023-02-16 (×5): qty 1

## 2023-02-16 MED ORDER — PROSOURCE TF20 ENFIT COMPATIBL EN LIQD
60.0000 mL | Freq: Every day | ENTERAL | Status: DC
  Administered 2023-02-16 – 2023-02-17 (×2): 60 mL
  Filled 2023-02-16: qty 60

## 2023-02-16 NOTE — Progress Notes (Signed)
Subjective: Continues to be confused.  Sitter at bedside.  ROS: Unable to obtain due to poor mental status  Examination  Vital signs in last 24 hours: Temp:  [97.7 F (36.5 C)-98.5 F (36.9 C)] 98.3 F (36.8 C) (06/05 0823) Pulse Rate:  [84-182] 93 (06/05 0342) Resp:  [18-20] 20 (06/05 0342) BP: (107-138)/(52-87) 108/75 (06/05 0823) SpO2:  [91 %-98 %] 98 % (06/05 0823)  General: lying in bed, appears restless Neuro: Opens eyes to repeated tactile stimulation, was able to tell me her name and that she is at [facility], did follow 1 command (sticking out her tongue), did not name any objects, spontaneously moving all 4 extremities in bed, mumbling something but difficult to understand  Basic Metabolic Panel: Recent Labs  Lab 02/12/23 0529 02/12/23 1620 02/13/23 0634 02/14/23 0601 02/15/23 0540 02/16/23 0629  NA 140  --  140 139 137 138  K 3.7  --  3.6 3.6 3.7 3.5  CL 109  --  101 107 106 107  CO2 24  --  24 23 21* 19*  GLUCOSE 143*  --  117* 121* 108* 140*  BUN 21  --  13 17 13 20   CREATININE 1.06*  --  0.76 0.66 0.62 0.65  CALCIUM 8.3*  --  8.5* 8.1* 8.4* 8.4*  MG 2.1 2.0 2.1 2.1 2.2 2.2  PHOS 4.0 2.9 3.2 3.1 3.2 3.4    CBC: Recent Labs  Lab 02/10/23 0444 02/11/23 0456 02/13/23 1729 02/14/23 0601 02/14/23 1743 02/15/23 0540 02/16/23 0841  WBC 7.1   < > 10.7* 8.8 10.5 9.8 9.7  NEUTROABS 4.0  --   --   --   --   --   --   HGB 14.3   < > 11.4* 11.4* 12.3 12.6 11.9*  HCT 41.0   < > 33.0* 32.8* 35.9* 37.4 35.4*  MCV 91.3   < > 89.7 88.2 88.2 88.6 90.8  PLT 105*   < > 144* 165 203 203 212   < > = values in this interval not displayed.     Coagulation Studies: No results for input(s): "LABPROT", "INR" in the last 72 hours.  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     Focal electrographic status epilepticus, resolved ICH, POA Acute encephalopathy - Seizures most likely secondary to underlying ICH - Status epilepticus has resolved.  EEG was also improved.  However patient continues to be encephalopathic.  This could be secondary to medications, delirium   Recommendations -Will reduce Keppra to 7500 mg twice daily -Continue Vimpat 200 mg twice daily -Continue seizure precautions, delirium precautions -As needed IV Versed for clinical seizures lasting more than 2 minutes -Discussed plan with Dr. Jerral Ralph   I have spent a total of  35 minutes with the patient reviewing hospital notes,  test results, labs and examining the patient as well as establishing an assessment and plan.  > 50% of time was spent in direct patient care.      Lindie Spruce Epilepsy [facility] For questions after 5pm please refer to AMION to reach the Neurologist on call

## 2023-02-16 NOTE — Procedures (Signed)
Cortrak  Person Inserting Tube:  Greig Castilla D, RD Tube Type:  Cortrak - 43 inches Tube Size:  10 Tube Location:  Left nare Secured by: Bridle Technique Used to Measure Tube Placement:  Marking at nare/corner of mouth Cortrak Secured At:  54 cm Procedure Comments:  Cortrak Tube Team Note:  Consult received to place a Cortrak feeding tube.   X-ray is required, abdominal x-ray has been ordered by the Cortrak team. Please confirm tube placement before using the Cortrak tube.   If the tube becomes dislodged please keep the tube and contact the Cortrak team at [email or web address] for replacement.  If after hours and replacement cannot be delayed, place a NG tube and confirm placement with an abdominal x-ray.    Greig Castilla, RD, LDN Clinical Dietitian RD pager # available in AMION  After hours/weekend pager # available in [facility]

## 2023-02-16 NOTE — Progress Notes (Signed)
Nutrition Brief Note   Discussed pt with MD in rounds. Pt PO intake remains minimal, plan to re-insert Cortrak feeding tube. Unit staff attempting to get safety sitter. Pt requires feeding assist with all meals. RN reports pt ate some bacon and applesauce this morning. RD to re-order TF once Cortrak is replaced and placement confirmed. Labs and medications reviewed.   Tube Feeds via Cortrak: Jevity 1.5 at 40 mL/hr (960 mL/day) 60 mL ProSource TF20 - daily Free water flush: 130 mL q4h Tube Feeds at goal provides 1520 kcal, 81 gm protein, and 1510 mL total free water daily.   RD will continue to follow through admission.   Kirby Crigler RD, LDN Clinical Dietitian See Loretha Stapler for contact information.

## 2023-02-16 NOTE — TOC Progression Note (Signed)
Transition of Care [facility]) - Progression Note    Patient Details  Name: Selena Branch MRN: [id number] Date of Birth: 1940-09-19  Transition of [facility] CM/SW Contact  Mearl Latin, LCSW Phone Number: 02/16/2023, 8:58 AM  Clinical Narrative:    CSW continuing to follow.    Expected Discharge Plan: Skilled Nursing Facility Barriers to Discharge: Continued Medical Work up, English as a second language teacher, Requiring sitter/restraints, SNF Pending bed offer  Expected Discharge Plan and Services In-house Referral: Clinical Social Work   Post Acute Care Choice: Skilled Nursing Facility Living arrangements for the past 2 months: Single Family Home                                       Social Determinants of Health (SDOH) Interventions SDOH Screenings   Depression (PHQ2-9): Low Risk  (09/28/2019)  Tobacco Use: Low Risk  (02/04/2023)    Readmission Risk Interventions     No data to display

## 2023-02-16 NOTE — Progress Notes (Signed)
Physical Therapy Treatment Patient Details Name: Selena Branch MRN: [id number] DOB: 1941/03/03 Today's Date: 02/16/2023   History of Present Illness Selena Branch is a 82 y.o. female admitted to [facility] on 5/24 with sudden onset altered mental status/aphasia. WUJ:[id number]; EEG: positive for seizures. PMHx: CLL currently in remission, glaucoma, hypertension, thrombocytopenia, hyperlipidemia GNF:AOZHYQMV; EEG: positive for seizures.    PT Comments    Pt greeted supine in bed, restless but eager for OOB mobility. Pt needing increased cues for safety as pt coming to stand before socks donned with this PTA kneeling at pt feet to assist donning socks. Pt requiring grossly min A for functional transfers and gait with RW for support with pt demonstrating good commands following for all functional activities. Pt needing increased assist to manage RW in hall and for navigation. Current plan remains appropriate to address deficits and maximize functional independence and decrease caregiver burden, however pt may benefit from familiar environment of home if pt with strict 24/7 assist. Pt continues to benefit from skilled PT services to progress toward functional mobility goals.      Recommendations for follow up therapy are one component of a multi-disciplinary discharge planning process, led by the attending physician.  Recommendations may be updated based on patient status, additional functional criteria and insurance authorization.  Follow Up Recommendations  Can patient physically be transported by private vehicle: No    Assistance Recommended at Discharge Frequent or constant Supervision/Assistance  Patient can return home with the following A lot of help with walking and/or transfers;A lot of help with bathing/dressing/bathroom;Direct supervision/assist for medications management;Direct supervision/assist for financial management;Assistance with cooking/housework;Assist for transportation;Help with stairs or  ramp for entrance   Equipment Recommendations  Other (comment) (defer to next level of care)    Recommendations for Other Services       Precautions / Restrictions Precautions Precautions: Fall Precaution Comments: seizure Restrictions Weight Bearing Restrictions: No     Mobility  Bed Mobility Overal bed mobility: Needs Assistance Bed Mobility: Supine to Sit, Sit to Supine     Supine to sit: Min guard, HOB elevated Sit to supine: Min guard, HOB elevated   General bed mobility comments: min guard A for safety, HOB elevated, cues to initiate task    Transfers Overall transfer level: Needs assistance Equipment used: Rolling walker (2 wheels), 1 person hand held assist Transfers: Sit to/from Stand Sit to Stand: Min assist           General transfer comment: min A to steady on each stand, pt standing before socks donned with this PTA kneeling in front on pt and pt placing hands on PTAs shoulders, pt able to rise to RW with min A x3 during session, max cues for safety and hand placement    Ambulation/Gait Ambulation/Gait assistance: Mod assist Gait Distance (Feet): 122 Feet Assistive device: Rolling walker (2 wheels) Gait Pattern/deviations: Step-through pattern, Decreased stride length, Shuffle, Trunk flexed Gait velocity: decr     General Gait Details: mod A to manage RW and facilitate forward momentum, grossly min A for balance with pt shuffling feet needing cues for increased clearance and gait speed   Stairs             Wheelchair Mobility    Modified Rankin (Stroke Patients Only)       Balance Overall balance assessment: Needs assistance Sitting-balance support: Single extremity supported, Feet supported Sitting balance-Leahy Scale: Fair Sitting balance - Comments: sitting EOB   Standing balance support: Bilateral upper extremity supported,  Reliant on assistive device for balance Standing balance-Leahy Scale: Poor Standing balance comment:  BUE support on RW                            Cognition Arousal/Alertness: Awake/alert Behavior During Therapy: Restless, Flat affect Overall Cognitive Status: Impaired/Different from baseline Area of Impairment: Orientation, Attention, Memory, Following commands, Safety/judgement, Awareness, Problem solving                 Orientation Level: Disoriented to, Place, Time, Situation Current Attention Level: Focused Memory: Decreased recall of precautions, Decreased short-term memory Following Commands: Follows one step commands inconsistently, Follows one step commands with increased time Safety/Judgement: Decreased awareness of safety, Decreased awareness of deficits Awareness: Intellectual Problem Solving: Slow processing, Difficulty sequencing, Requires verbal cues, Requires tactile cues General Comments: Pt restless,standing before socks donned, Pt with limited insight, safety awareness, orientation and attention. Pt requiring max verbal/visual/tactile cues for redirection.        Exercises      General Comments General comments (skin integrity, edema, etc.): VSS on RA, sitter present throughout session      Pertinent Vitals/Pain Pain Assessment Pain Assessment: No/denies pain    Home Living                          Prior Function            PT Goals (current goals can now be found in the care plan section) Acute Rehab PT Goals PT Goal Formulation: Patient unable to participate in goal setting Time For Goal Achievement: 02/22/23 Progress towards PT goals: Progressing toward goals    Frequency    Min 3X/week      PT Plan      Co-evaluation              AM-PAC PT "6 Clicks" Mobility   Outcome Measure  Help needed turning from your back to your side while in a flat bed without using bedrails?: A Lot Help needed moving from lying on your back to sitting on the side of a flat bed without using bedrails?: A Lot Help needed  moving to and from a bed to a chair (including a wheelchair)?: A Lot Help needed standing up from a chair using your arms (e.g., wheelchair or bedside chair)?: A Little Help needed to walk in hospital room?: A Lot Help needed climbing 3-5 steps with a railing? : Total 6 Click Score: 12    End of Session Equipment Utilized During Treatment: Gait belt Activity Tolerance: Patient tolerated treatment well Patient left: in bed;with call bell/phone within reach;with bed alarm set;with nursing/sitter in room (with mitt reapplied and bed in chair position) Nurse Communication: Mobility status PT Visit Diagnosis: Muscle weakness (generalized) (M62.81);Other abnormalities of gait and mobility (R26.89);Unsteadiness on feet (R26.81)     Time: 1610-9604 PT Time Calculation (min) (ACUTE ONLY): 21 min  Charges:  $Gait Training: 8-22 mins                     Rut Betterton R. PTA Acute Rehabilitation Services Office: [phone]   Catalina Antigua 02/16/2023, 4:34 PM

## 2023-02-16 NOTE — Progress Notes (Signed)
PROGRESS NOTE        PATIENT DETAILS Name: Selena Branch Age: 82 y.o. Sex: female Date of Birth: [birth year] Admit Date: 02/04/2023 Admitting Physician Rodolph Bong, MD WUJ:[id number], Toniann Fail, MD  Brief Summary: Patient is a 82 y.o.  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  Unfortunately-further hospital course complicated by delirium, rectus muscle hematoma with acute blood loss anemia.    Significant studies: 5/24>> CT head: No acute abnormality. 5/24>> CT angio head/neck: No LVO/significant stenosis 5/24>> MRI brain: No acute intracranial process. 6/02>> CT abdomen/pelvis: Pelvic hematoma, intramuscular hemorrhage of right lower rectus muscle, hemoperitoneum suspected.  Significant microbiology data: None  Procedures: 5/24-5/25>> LTM EEG: Status epilepticus-left hemisphere-left parietal region 6/02>> Spot EEG: No definite seizures. 6/02-6/03>> LTM EEG: No seizures  Consults: Neurology  Subjective: Required 2 point soft restraints last night-pulled out NG tube yesterday.  Confused-somewhat redirectable-no family at bedside.  Objective: Vitals: Blood pressure 108/75, pulse 93, temperature 98.3 F (36.8 C), temperature source Axillary, resp. rate 20, weight 49.9 kg, SpO2 98 %.   Exam: Gen Exam:not in any distress HEENT:atraumatic, normocephalic Chest: B/L clear to auscultation anteriorly CVS:S1S2 regular Abdomen:soft non tender, non distended Extremities:no edema Neurology: Non focal Skin: no rash  Pertinent Labs/Radiology:    Latest Ref Rng & Units 02/16/2023    8:41 AM 02/15/2023    5:40 AM 02/14/2023    5:43 PM  CBC  WBC 4.0 - 10.5 K/uL 9.7  9.8  10.5   Hemoglobin 12.0 - 15.0 g/dL 82.9  56.2  13.0   Hematocrit 36.0 - 46.0 % 35.4  37.4  35.9   Platelets 150 - 400 K/uL 212  203  203     Lab Results  Component Value Date    NA 138 02/16/2023   K 3.5 02/16/2023   CL 107 02/16/2023   CO2 19 (L) 02/16/2023      Assessment/Plan: Acute metabolic encephalopathy/expressive aphasia secondary to status epilepticus Status epilepticus has resolved-unfortunately now confused likely due to hospital delirium/possibly medications On Keppra (dosage reduced 6/4) and Vimpat Neurology team following  Delirium Likely due to acute illness/hospital delirium Remains confused/agitated-required soft 2 point restraints last night Increase Seroquel to 50 mg nightly Delirium precautions Have requested safety sitter-once available-will attempt to remove restraints. Due to severe confusion-hardly any oral intake-had cortrak tube until he is clear that was pulled out-will reattempt reinsertion later today.  Rectus muscle hematoma/pelvic hematoma/possible hemoperitoneum with acute blood loss anemia Does not flinch-express pain on abdominal exam this morning Hb now stable after multiple units of PRBC transfusion Follow CBC  History of ICH Stable-supportive care-nonfocal exam  History of CLL In remission-WBC remains stable  Thrombocytopenia Continue Promacta Platelet count stable Appreciate hematology input.  HTN BP stable Coreg/hydralazine  HLD Statin  Acute urine retention UTI Continue Foley catheter-will attempt voiding trial when she has less delirious/when more ambulatory. Completed Rocephin x 3 days for presumed UTI.  Mood disorder Zoloft  Nutrition Status: Nutrition Problem: Moderate Malnutrition Etiology: chronic illness Signs/Symptoms: mild fat depletion, moderate muscle depletion Interventions: Tube feeding  BMI: Estimated body mass index is 22.99 kg/m as calculated from the following:   Height as of 05/29/21: 4\' 10"  (1.473 m).   Weight as of this encounter: 49.9 kg.   Code status:  Code Status: DNR   DVT Prophylaxis: Place and maintain sequential compression device Start: 02/14/23 0709 SCDs  Start: 02/04/23 1729   Family Communication: None at bedside   Disposition Plan: Status is: Inpatient Remains inpatient appropriate because: Severity of illness   Planned Discharge Destination:Skilled nursing facility   Diet: Diet Order             Diet regular Room service appropriate? No; Fluid consistency: Thin  Diet effective now                     Antimicrobial agents: Anti-infectives (From admission, onward)    Start     Dose/Rate Route Frequency Ordered Stop   02/08/23 0600  cefTRIAXone (ROCEPHIN) 1 g in sodium chloride 0.9 % 100 mL IVPB        1 g 200 mL/hr over 30 Minutes Intravenous Every 24 hours 02/07/23 0600 02/09/23 0547   02/07/23 0700  cefTRIAXone (ROCEPHIN) 1 g in sodium chloride 0.9 % 100 mL IVPB        1 g 200 mL/hr over 30 Minutes Intravenous  Once 02/07/23 0601 02/07/23 0645        MEDICATIONS: Scheduled Meds:  amLODipine  10 mg Per Tube QHS   atorvastatin  40 mg Per Tube Daily   carvedilol  3.125 mg Per Tube BID WC   chlorhexidine gluconate (MEDLINE KIT)  15 mL Mouth Rinse BID   Chlorhexidine Gluconate Cloth  6 each Topical Daily   dorzolamide-timolol  1 drop Both Eyes Daily   eltrombopag  25 mg Oral Q M,W,F   feeding supplement  237 mL Oral TID BM   furosemide  20 mg Intravenous Once   hydrALAZINE  25 mg Per Tube Q6H   latanoprost  1 drop Both Eyes QHS   multivitamin with minerals  1 tablet Per Tube Daily   mouth rinse  15 mL Mouth Rinse 10 times per day   pantoprazole (PROTONIX) IV  40 mg Intravenous Q12H   potassium chloride  40 mEq Oral Once   QUEtiapine  25 mg Per Tube Q24H   sertraline  25 mg Per Tube Daily   silver sulfADIAZINE   Topical BID   sodium chloride flush  3 mL Intravenous Q12H   tamsulosin  0.4 mg Oral Daily   Continuous Infusions:  lacosamide (VIMPAT) IV 200 mg (02/16/23 1011)   levETIRAcetam 1,000 mg (02/15/23 2234)   PRN Meds:.acetaminophen **OR** acetaminophen, hydrALAZINE, midazolam, ondansetron **OR**  ondansetron (ZOFRAN) IV, polyethylene glycol, sorbitol   I have personally reviewed following labs and imaging studies  LABORATORY DATA: CBC: Recent Labs  Lab 02/10/23 0444 02/11/23 0456 02/13/23 1729 02/14/23 0601 02/14/23 1743 02/15/23 0540 02/16/23 0841  WBC 7.1   < > 10.7* 8.8 10.5 9.8 9.7  NEUTROABS 4.0  --   --   --   --   --   --   HGB 14.3   < > 11.4* 11.4* 12.3 12.6 11.9*  HCT 41.0   < > 33.0* 32.8* 35.9* 37.4 35.4*  MCV 91.3   < > 89.7 88.2 88.2 88.6 90.8  PLT 105*   < > 144* 165 203 203 212   < > = values in this interval not displayed.    Basic Metabolic Panel: Recent Labs  Lab 02/12/23 0529 02/12/23 1620 02/13/23 0634 02/14/23 0601 02/15/23 0540 02/16/23 0629  NA 140  --  140 139 137 138  K 3.7  --  3.6 3.6 3.7 3.5  CL  109  --  101 107 106 107  CO2 24  --  24 23 21* 19*  GLUCOSE 143*  --  117* 121* 108* 140*  BUN 21  --  13 17 13 20   CREATININE 1.06*  --  0.76 0.66 0.62 0.65  CALCIUM 8.3*  --  8.5* 8.1* 8.4* 8.4*  MG 2.1 2.0 2.1 2.1 2.2 2.2  PHOS 4.0 2.9 3.2 3.1 3.2 3.4    GFR: CrCl cannot be calculated (Unknown ideal weight.).  Liver Function Tests: Recent Labs  Lab 02/12/23 1047  AST 31  ALT 20  ALKPHOS 46  BILITOT 0.4  PROT 5.0*  ALBUMIN 3.1*   No results for input(s): "LIPASE", "AMYLASE" in the last 168 hours. Recent Labs  Lab 02/10/23 0444  AMMONIA 27    Coagulation Profile: No results for input(s): "INR", "PROTIME" in the last 168 hours.  Cardiac Enzymes: No results for input(s): "CKTOTAL", "CKMB", "CKMBINDEX", "TROPONINI" in the last 168 hours.  BNP (last 3 results) No results for input(s): "PROBNP" in the last 8760 hours.  Lipid Profile: No results for input(s): "CHOL", "HDL", "LDLCALC", "TRIG", "CHOLHDL", "LDLDIRECT" in the last 72 hours.  Thyroid Function Tests: No results for input(s): "TSH", "T4TOTAL", "FREET4", "T3FREE", "THYROIDAB" in the last 72 hours.  Anemia Panel: No results for input(s): "VITAMINB12",  "FOLATE", "FERRITIN", "TIBC", "IRON", "RETICCTPCT" in the last 72 hours.  Urine analysis:    Component Value Date/Time   COLORURINE YELLOW 02/04/2023 1723   APPEARANCEUR HAZY (A) 02/04/2023 1723   LABSPEC 1.013 02/04/2023 1723   PHURINE 6.0 02/04/2023 1723   GLUCOSEU NEGATIVE 02/04/2023 1723   HGBUR LARGE (A) 02/04/2023 1723   BILIRUBINUR NEGATIVE 02/04/2023 1723   KETONESUR NEGATIVE 02/04/2023 1723   PROTEINUR NEGATIVE 02/04/2023 1723   UROBILINOGEN 0.2 05/06/2010 1611   NITRITE NEGATIVE 02/04/2023 1723   LEUKOCYTESUR TRACE (A) 02/04/2023 1723    Sepsis Labs: Lactic Acid, Venous    Component Value Date/Time   LATICACIDVEN 0.8 09/17/2019 0131    MICROBIOLOGY: No results found for this or any previous visit (from the past 240 hour(s)).  RADIOLOGY STUDIES/RESULTS: DG Abd Portable 1V  Result Date: 02/14/2023 CLINICAL DATA:  Feeding tube placement. EXAM: PORTABLE ABDOMEN - 1 VIEW COMPARISON:  Abdominopelvic CT yesterday FINDINGS: Tip of the weighted enteric tube is in the right upper quadrant, in the proximal duodenum on yesterday's CT. No dilatation of upper abdominal bowel loops. IMPRESSION: Tip of the weighted enteric tube in the proximal duodenum. Electronically Signed   By: Narda Rutherford M.D.   On: 02/14/2023 14:17     LOS: 11 days   Jeoffrey Massed, MD  [facility]    To contact the attending provider between 7A-7P or the covering provider during after hours 7P-7A, please log into the web site [email or web address] and access using universal [place] password for that web site. If you do not have the password, please call the hospital operator.  02/16/2023, 11:38 AM

## 2023-02-17 DIAGNOSIS — R569 Unspecified convulsions: Secondary | ICD-10-CM | POA: Diagnosis not present

## 2023-02-17 DIAGNOSIS — R4701 Aphasia: Secondary | ICD-10-CM | POA: Diagnosis not present

## 2023-02-17 DIAGNOSIS — E785 Hyperlipidemia, unspecified: Secondary | ICD-10-CM | POA: Diagnosis not present

## 2023-02-17 DIAGNOSIS — C9110 Chronic lymphocytic leukemia of B-cell type not having achieved remission: Secondary | ICD-10-CM | POA: Diagnosis not present

## 2023-02-17 LAB — CBC
HCT: 33.6 % — ABNORMAL LOW (ref 36.0–46.0)
Hemoglobin: 11.2 g/dL — ABNORMAL LOW (ref 12.0–15.0)
MCH: 30.6 pg (ref 26.0–34.0)
MCHC: 33.3 g/dL (ref 30.0–36.0)
MCV: 91.8 fL (ref 80.0–100.0)
Platelets: 223 10*3/uL (ref 150–400)
RBC: 3.66 MIL/uL — ABNORMAL LOW (ref 3.87–5.11)
RDW: 16.3 % — ABNORMAL HIGH (ref 11.5–15.5)
WBC: 8.7 10*3/uL (ref 4.0–10.5)
nRBC: 0 % (ref 0.0–0.2)

## 2023-02-17 LAB — BASIC METABOLIC PANEL
Anion gap: 10 (ref 5–15)
BUN: 27 mg/dL — ABNORMAL HIGH (ref 8–23)
CO2: 23 mmol/L (ref 22–32)
Calcium: 8.3 mg/dL — ABNORMAL LOW (ref 8.9–10.3)
Chloride: 106 mmol/L (ref 98–111)
Creatinine, Ser: 0.69 mg/dL (ref 0.44–1.00)
GFR, Estimated: >60 mL/min (ref >60)
Glucose, Bld: 148 mg/dL — ABNORMAL HIGH (ref 70–99)
Potassium: 3.7 mmol/L (ref 3.5–5.1)
Sodium: 139 mmol/L (ref 135–145)

## 2023-02-17 LAB — MAGNESIUM: Magnesium: 2.2 mg/dL (ref 1.7–2.4)

## 2023-02-17 LAB — GLUCOSE, CAPILLARY
Glucose-Capillary: 125 mg/dL — ABNORMAL HIGH (ref 70–99)
Glucose-Capillary: 128 mg/dL — ABNORMAL HIGH (ref 70–99)
Glucose-Capillary: 118 mg/dL — ABNORMAL HIGH (ref 70–99)
Glucose-Capillary: 105 mg/dL — ABNORMAL HIGH (ref 70–99)
Glucose-Capillary: 94 mg/dL (ref 70–99)

## 2023-02-17 LAB — PHOSPHORUS: Phosphorus: 4 mg/dL (ref 2.5–4.6)

## 2023-02-17 MED ORDER — LEVETIRACETAM IN NACL 500 MG/100ML IV SOLN
500.0000 mg | Freq: Two times a day (BID) | INTRAVENOUS | Status: DC
  Administered 2023-02-17 – 2023-02-18 (×2): 500 mg via INTRAVENOUS
  Filled 2023-02-17 (×2): qty 100

## 2023-02-17 MED ORDER — LEVETIRACETAM IN NACL 500 MG/100ML IV SOLN
500.0000 mg | Freq: Once | INTRAVENOUS | Status: AC
  Administered 2023-02-17: 500 mg via INTRAVENOUS
  Filled 2023-02-17: qty 100

## 2023-02-17 MED ORDER — QUETIAPINE FUMARATE 25 MG PO TABS
25.0000 mg | ORAL_TABLET | Freq: Every morning | ORAL | Status: DC
  Administered 2023-02-17: 25 mg via ORAL
  Filled 2023-02-17: qty 1

## 2023-02-17 NOTE — Plan of Care (Signed)
  Problem: Clinical Measurements: Goal: Complications related to the disease process, condition or treatment will be avoided or minimized Outcome: Not Progressing   Problem: Clinical Measurements: Goal: Diagnostic test results will improve Outcome: Not Progressing   Problem: Safety: Goal: Verbalization of understanding the information provided will improve Outcome: Not Progressing   Problem: Nutrition: Goal: Adequate nutrition will be maintained Outcome: Not Progressing

## 2023-02-17 NOTE — Progress Notes (Signed)
PROGRESS NOTE        PATIENT DETAILS Name: Selena Branch Age: 82 y.o. Sex: female Date of Birth: Jun 16, 1941 Admit Date: 02/04/2023 Admitting Physician Rodolph Bong, MD ZOX:[id number], Toniann Fail, MD  Brief Summary: Patient is a 82 y.o.  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  Unfortunately-further hospital course complicated by delirium, rectus muscle hematoma with acute blood loss anemia.    Significant studies: 5/24>> CT head: No acute abnormality. 5/24>> CT angio head/neck: No LVO/significant stenosis 5/24>> MRI brain: No acute intracranial process. 6/02>> CT abdomen/pelvis: Pelvic hematoma, intramuscular hemorrhage of right lower rectus muscle, hemoperitoneum suspected.  Significant microbiology data: None  Procedures: 5/24-5/25>> LTM EEG: Status epilepticus-left hemisphere-left parietal region 6/02>> Spot EEG: No definite seizures. 6/02-6/03>> LTM EEG: No seizures  Consults: Neurology  Subjective: More awake and alert this morning compared to yesterday-unfortunately she pulled out her NG tube again this morning.  No family at bedside.  Answers simple questions appropriately-but still lethargic and overall confused and not yet back to baseline.  Objective: Vitals: Blood pressure (!) 114/50, pulse 95, temperature 98 F (36.7 C), temperature source Oral, resp. rate 20, weight 50.9 kg, SpO2 93 %.   Exam: Gen Exam:-not in any distress HEENT:atraumatic, normocephalic Chest: B/L clear to auscultation anteriorly CVS:S1S2 regular Abdomen:soft non tender, non distended Extremities:no edema Neurology: Non focal Skin: no rash  Pertinent Labs/Radiology:    Latest Ref Rng & Units 02/17/2023    6:51 AM 02/16/2023    8:41 AM 02/15/2023    5:40 AM  CBC  WBC 4.0 - 10.5 K/uL 8.7  9.7  9.8   Hemoglobin 12.0 - 15.0 g/dL 09.8  11.9  14.7    Hematocrit 36.0 - 46.0 % 33.6  35.4  37.4   Platelets 150 - 400 K/uL 223  212  203     Lab Results  Component Value Date   NA 139 02/17/2023   K 3.7 02/17/2023   CL 106 02/17/2023   CO2 23 02/17/2023      Assessment/Plan: Acute metabolic encephalopathy/expressive aphasia secondary to status epilepticus Status epilepticus has resolved-unfortunately now confused likely due to hospital delirium/possibly medications On Keppra (dosage being reduced by neurology 6/6)  and Vimpat Neurology team following  Delirium Likely due to acute illness/hospital delirium Less confused compared to yesterday-but still having issues with agitation-continue Seroquel 50 mg nightly-add a.m. dose of Seroquel.  She unfortunately pulled out cortrak again this morning. Discussed with nursing staff-one-to-one sitter again ordered. For now-will avoid NG tube reinsertion-and of oral intake and see how she does.  Rectus muscle hematoma/pelvic hematoma/possible hemoperitoneum with acute blood loss anemia Does not flinch-express pain on abdominal exam this morning Hb now stable after multiple units of PRBC transfusion Follow CBC  History of ICH Stable-supportive care-nonfocal exam  History of CLL In remission-WBC remains stable  Hx of ITP Continue Promacta Platelet count stable Appreciate hematology input.  HTN BP stable Coreg/hydralazine  HLD Statin  Acute urine retention UTI Continue Foley catheter-will attempt voiding trial when she has less delirious/when more ambulatory. Completed Rocephin x 3 days for presumed UTI.  Mood disorder Zoloft  Nutrition Status: Nutrition Problem: Moderate Malnutrition Etiology: chronic illness Signs/Symptoms: mild fat depletion, moderate muscle depletion Interventions: Tube feeding  BMI: Estimated body mass index is 23.45  kg/m as calculated from the following:   Height as of 05/29/21: 4\' 10"  (1.473 m).   Weight as of this encounter: 50.9 kg.   Code  status:   Code Status: DNR   DVT Prophylaxis: Place and maintain sequential compression device Start: 02/14/23 0709 SCDs Start: 02/04/23 1729   Family Communication: None at bedside.  Had discussed with multiple family members including daughter yesterday.   Disposition Plan: Status is: Inpatient Remains inpatient appropriate because: Severity of illness   Planned Discharge Destination:Skilled nursing facility   Diet: Diet Order             Diet regular Room service appropriate? No; Fluid consistency: Thin  Diet effective now                     Antimicrobial agents: Anti-infectives (From admission, onward)    Start     Dose/Rate Route Frequency Ordered Stop   02/08/23 0600  cefTRIAXone (ROCEPHIN) 1 g in sodium chloride 0.9 % 100 mL IVPB        1 g 200 mL/hr over 30 Minutes Intravenous Every 24 hours 02/07/23 0600 02/09/23 0547   02/07/23 0700  cefTRIAXone (ROCEPHIN) 1 g in sodium chloride 0.9 % 100 mL IVPB        1 g 200 mL/hr over 30 Minutes Intravenous  Once 02/07/23 0601 02/07/23 0645        MEDICATIONS: Scheduled Meds:  amLODipine  10 mg Per Tube QHS   atorvastatin  40 mg Per Tube Daily   carvedilol  3.125 mg Per Tube BID WC   chlorhexidine gluconate (MEDLINE KIT)  15 mL Mouth Rinse BID   Chlorhexidine Gluconate Cloth  6 each Topical Daily   dorzolamide-timolol  1 drop Both Eyes Daily   eltrombopag  25 mg Oral Q M,W,F   feeding supplement  237 mL Oral TID BM   feeding supplement (PROSource TF20)  60 mL Per Tube Daily   free water  130 mL Per Tube Q4H   furosemide  20 mg Intravenous Once   hydrALAZINE  25 mg Per Tube Q6H   latanoprost  1 drop Both Eyes QHS   multivitamin with minerals  1 tablet Per Tube Daily   mouth rinse  15 mL Mouth Rinse 10 times per day   pantoprazole (PROTONIX) IV  40 mg Intravenous Q12H   potassium chloride  40 mEq Oral Once   QUEtiapine  50 mg Per Tube Q24H   sertraline  25 mg Per Tube Daily   silver sulfADIAZINE    Topical BID   sodium chloride flush  3 mL Intravenous Q12H   tamsulosin  0.4 mg Oral Daily   Continuous Infusions:  feeding supplement (JEVITY 1.5 CAL/FIBER) 1,000 mL (02/16/23 1709)   lacosamide (VIMPAT) IV 200 mg (02/16/23 2144)   levETIRAcetam     PRN Meds:.acetaminophen **OR** acetaminophen, haloperidol lactate, hydrALAZINE, midazolam, ondansetron **OR** ondansetron (ZOFRAN) IV, polyethylene glycol, sorbitol   I have personally reviewed following labs and imaging studies  LABORATORY DATA: CBC: Recent Labs  Lab 02/14/23 0601 02/14/23 1743 02/15/23 0540 02/16/23 0841 02/17/23 0651  WBC 8.8 10.5 9.8 9.7 8.7  HGB 11.4* 12.3 12.6 11.9* 11.2*  HCT 32.8* 35.9* 37.4 35.4* 33.6*  MCV 88.2 88.2 88.6 90.8 91.8  PLT 165 203 203 212 223     Basic Metabolic Panel: Recent Labs  Lab 02/13/23 0634 02/14/23 0601 02/15/23 0540 02/16/23 0629 02/17/23 0651  NA 140 139 137 138 139  K 3.6  3.6 3.7 3.5 3.7  CL 101 107 106 107 106  CO2 24 23 21* 19* 23  GLUCOSE 117* 121* 108* 140* 148*  BUN 13 17 13 20  27*  CREATININE 0.76 0.66 0.62 0.65 0.69  CALCIUM 8.5* 8.1* 8.4* 8.4* 8.3*  MG 2.1 2.1 2.2 2.2 2.2  PHOS 3.2 3.1 3.2 3.4 4.0     GFR: CrCl cannot be calculated (Unknown ideal weight.).  Liver Function Tests: Recent Labs  Lab 02/12/23 1047  AST 31  ALT 20  ALKPHOS 46  BILITOT 0.4  PROT 5.0*  ALBUMIN 3.1*    No results for input(s): "LIPASE", "AMYLASE" in the last 168 hours. No results for input(s): "AMMONIA" in the last 168 hours.   Coagulation Profile: No results for input(s): "INR", "PROTIME" in the last 168 hours.  Cardiac Enzymes: No results for input(s): "CKTOTAL", "CKMB", "CKMBINDEX", "TROPONINI" in the last 168 hours.  BNP (last 3 results) No results for input(s): "PROBNP" in the last 8760 hours.  Lipid Profile: No results for input(s): "CHOL", "HDL", "LDLCALC", "TRIG", "CHOLHDL", "LDLDIRECT" in the last 72 hours.  Thyroid Function Tests: No results  for input(s): "TSH", "T4TOTAL", "FREET4", "T3FREE", "THYROIDAB" in the last 72 hours.  Anemia Panel: No results for input(s): "VITAMINB12", "FOLATE", "FERRITIN", "TIBC", "IRON", "RETICCTPCT" in the last 72 hours.  Urine analysis:    Component Value Date/Time   COLORURINE YELLOW 02/04/2023 1723   APPEARANCEUR HAZY (A) 02/04/2023 1723   LABSPEC 1.013 02/04/2023 1723   PHURINE 6.0 02/04/2023 1723   GLUCOSEU NEGATIVE 02/04/2023 1723   HGBUR LARGE (A) 02/04/2023 1723   BILIRUBINUR NEGATIVE 02/04/2023 1723   KETONESUR NEGATIVE 02/04/2023 1723   PROTEINUR NEGATIVE 02/04/2023 1723   UROBILINOGEN 0.2 05/06/2010 1611   NITRITE NEGATIVE 02/04/2023 1723   LEUKOCYTESUR TRACE (A) 02/04/2023 1723    Sepsis Labs: Lactic Acid, Venous    Component Value Date/Time   LATICACIDVEN 0.8 09/17/2019 0131    MICROBIOLOGY: No results found for this or any previous visit (from the past 240 hour(s)).  RADIOLOGY STUDIES/RESULTS: DG Abd Portable 1V  Result Date: 02/16/2023 CLINICAL DATA:  Feeding tube placement. EXAM: PORTABLE ABDOMEN - 1 VIEW COMPARISON:  02/14/2023 FINDINGS: Feeding tube tip in the region of the gastric pylorus. Normal sized heart. Interval mild linear atelectasis in both lower lung zones. Lower thoracic spine degenerative changes. The included bowel-gas pattern is normal. IMPRESSION: Feeding tube tip in the region of the gastric pylorus. Electronically Signed   By: Beckie Salts M.D.   On: 02/16/2023 16:10     LOS: 12 days   Jeoffrey Massed, MD  [facility]    To contact the attending provider between 7A-7P or the covering provider during after hours 7P-7A, please log into the web site [email or web address] and access using universal [place] password for that web site. If you do not have the password, please call the hospital operator.  02/17/2023, 11:23 AM

## 2023-02-17 NOTE — Progress Notes (Signed)
Called both emergency contacts, daughter and spouse and no one answered. Attempting to notify of restraints applied.  Patient has been attempting to remove urinary catheter.  She has continually removed her clothing and cardiac electrodes.  She is talking to people that are not present in the room.  Attempting to console her, but she is inconsolable.

## 2023-02-17 NOTE — Progress Notes (Signed)
Subjective: Pulled out her NG tube again.  Was more awake and answering questions appropriately.  No family at bedside.  ROS: Unable to obtain due to poor mental status  Examination  Vital signs in last 24 hours: Temp:  [97.6 F (36.4 C)-98.1 F (36.7 C)] 98 F (36.7 C) (06/06 0800) Pulse Rate:  [78-95] 95 (06/06 0832) Resp:  [20] 20 (06/06 0800) BP: (91-136)/(42-60) 114/50 (06/06 0832) SpO2:  [93 %-94 %] 93 % (06/06 0329) Weight:  [50.9 kg] 50.9 kg (06/06 0347)  General: lying in bed, NAD  Neuro: MS: Alert, oriented to place and person, time: February  2005, follows commands, able to name objects CN: pupils equal and reactive,  EOMI, face symmetric, tongue midline, normal sensation over face, Motor: 4/5 strength in all 4 extremities Gait: not tested  Basic Metabolic Panel: Recent Labs  Lab 02/13/23 0634 02/14/23 0601 02/15/23 0540 02/16/23 0629 02/17/23 0651  NA 140 139 137 138 139  K 3.6 3.6 3.7 3.5 3.7  CL 101 107 106 107 106  CO2 24 23 21* 19* 23  GLUCOSE 117* 121* 108* 140* 148*  BUN 13 17 13 20  27*  CREATININE 0.76 0.66 0.62 0.65 0.69  CALCIUM 8.5* 8.1* 8.4* 8.4* 8.3*  MG 2.1 2.1 2.2 2.2 2.2  PHOS 3.2 3.1 3.2 3.4 4.0    CBC: Recent Labs  Lab 02/14/23 0601 02/14/23 1743 02/15/23 0540 02/16/23 0841 02/17/23 0651  WBC 8.8 10.5 9.8 9.7 8.7  HGB 11.4* 12.3 12.6 11.9* 11.2*  HCT 32.8* 35.9* 37.4 35.4* 33.6*  MCV 88.2 88.2 88.6 90.8 91.8  PLT 165 203 203 212 223     Coagulation Studies: No results for input(s): "LABPROT", "INR" in the last 72 hours.  Imaging  No new brain imaging overnight   ASSESSMENT AND PLAN: 82 year old female with previous left temporal lobe and tiny right parietal ICH in the setting of ITP with platelets 5 presented with aphasia and EEG showed focal electrographic status epilepticus arising from left hemisphere, maximal left parietal region.     Focal electrographic status epilepticus, resolved ICH, POA Acute  encephalopathy - Seizures most likely secondary to underlying ICH - Status epilepticus has resolved.  EEG was also improved.  However patient continues to be encephalopathic.  This could be secondary to medications, delirium   Recommendations -Will reduce Keppra to 500 mg twice daily -Continue Vimpat 200 mg twice daily -Continue seizure precautions, delirium precautions -As needed IV Versed for clinical seizures lasting more than 2 minutes -Discussed plan with Dr. Jerral Ralph, called and left voice mail for daughter with update   I have spent a total of  35 minutes with the patient reviewing hospital notes,  test results, labs and examining the patient as well as establishing an assessment and plan.  > 50% of time was spent in direct patient care.   Lindie Spruce Epilepsy [facility] For questions after 5pm please refer to AMION to reach the Neurologist on call

## 2023-02-17 NOTE — Progress Notes (Signed)
Patient spit out her pills on the bed and refused to take.

## 2023-02-17 NOTE — Progress Notes (Signed)
Attempted to feed patient dinner.  She refuses to eat.  She is hallucinating, and thinks she sees people in the room.

## 2023-02-17 NOTE — Progress Notes (Signed)
Patient pulled out her coretrac tube and her PIVs.  She removed her clothing and leads.  She had mittens which were checked and adjusted this morning.  She removed the mittens with her teeth.  Notified MD.

## 2023-02-18 DIAGNOSIS — R569 Unspecified convulsions: Secondary | ICD-10-CM | POA: Diagnosis not present

## 2023-02-18 DIAGNOSIS — R4701 Aphasia: Secondary | ICD-10-CM | POA: Diagnosis not present

## 2023-02-18 DIAGNOSIS — C9110 Chronic lymphocytic leukemia of B-cell type not having achieved remission: Secondary | ICD-10-CM | POA: Diagnosis not present

## 2023-02-18 DIAGNOSIS — E785 Hyperlipidemia, unspecified: Secondary | ICD-10-CM | POA: Diagnosis not present

## 2023-02-18 LAB — CBC
HCT: 37.5 % (ref 36.0–46.0)
Hemoglobin: 12.3 g/dL (ref 12.0–15.0)
MCH: 29.7 pg (ref 26.0–34.0)
MCHC: 32.8 g/dL (ref 30.0–36.0)
MCV: 90.6 fL (ref 80.0–100.0)
Platelets: 266 10*3/uL (ref 150–400)
RBC: 4.14 MIL/uL (ref 3.87–5.11)
RDW: 15.6 % — ABNORMAL HIGH (ref 11.5–15.5)
WBC: 11.3 10*3/uL — ABNORMAL HIGH (ref 4.0–10.5)
nRBC: 0 % (ref 0.0–0.2)

## 2023-02-18 LAB — BASIC METABOLIC PANEL
Anion gap: 12 (ref 5–15)
BUN: 17 mg/dL (ref 8–23)
CO2: 21 mmol/L — ABNORMAL LOW (ref 22–32)
Calcium: 8.4 mg/dL — ABNORMAL LOW (ref 8.9–10.3)
Chloride: 105 mmol/L (ref 98–111)
Creatinine, Ser: 0.61 mg/dL (ref 0.44–1.00)
GFR, Estimated: >60 mL/min (ref >60)
Glucose, Bld: 110 mg/dL — ABNORMAL HIGH (ref 70–99)
Potassium: 3.6 mmol/L (ref 3.5–5.1)
Sodium: 138 mmol/L (ref 135–145)

## 2023-02-18 LAB — GLUCOSE, CAPILLARY
Glucose-Capillary: 103 mg/dL — ABNORMAL HIGH (ref 70–99)
Glucose-Capillary: 108 mg/dL — ABNORMAL HIGH (ref 70–99)
Glucose-Capillary: 110 mg/dL — ABNORMAL HIGH (ref 70–99)
Glucose-Capillary: 92 mg/dL (ref 70–99)
Glucose-Capillary: 96 mg/dL (ref 70–99)
Glucose-Capillary: 97 mg/dL (ref 70–99)

## 2023-02-18 LAB — PHOSPHORUS: Phosphorus: 3 mg/dL (ref 2.5–4.6)

## 2023-02-18 LAB — MAGNESIUM: Magnesium: 2.1 mg/dL (ref 1.7–2.4)

## 2023-02-18 MED ORDER — QUETIAPINE FUMARATE 50 MG PO TABS
50.0000 mg | ORAL_TABLET | Freq: Two times a day (BID) | ORAL | Status: DC
  Administered 2023-02-18 – 2023-02-22 (×8): 50 mg via ORAL
  Filled 2023-02-18 (×9): qty 1

## 2023-02-18 MED ORDER — VALPROATE SODIUM 100 MG/ML IV SOLN
500.0000 mg | Freq: Two times a day (BID) | INTRAVENOUS | Status: DC
  Administered 2023-02-18 – 2023-02-21 (×7): 500 mg via INTRAVENOUS
  Filled 2023-02-18 (×2): qty 500
  Filled 2023-02-18: qty 5
  Filled 2023-02-18: qty 500
  Filled 2023-02-18: qty 5
  Filled 2023-02-18 (×4): qty 500

## 2023-02-18 MED ORDER — QUETIAPINE FUMARATE 50 MG PO TABS: 50.0000 mg | ORAL_TABLET | Freq: Every morning | ORAL | Status: DC

## 2023-02-18 NOTE — TOC Progression Note (Signed)
Transition of Care [facility]) - Progression Note    Patient Details  Name: Selena Branch MRN: [id number] Date of Birth: 1940/09/23  Transition of Care [facility]) CM/SW Contact  Mearl Latin, LCSW Phone Number: 02/18/2023, 9:19 AM  Clinical Narrative:    CSW continuing to follow.    Expected Discharge Plan: Skilled Nursing Facility Barriers to Discharge: Continued Medical Work up, English as a second language teacher, Requiring sitter/restraints, SNF Pending bed offer  Expected Discharge Plan and Services In-house Referral: Clinical Social Work   Post Acute Care Choice: Skilled Nursing Facility Living arrangements for the past 2 months: Single Family Home                                       Social Determinants of Health (SDOH) Interventions SDOH Screenings   Depression (PHQ2-9): Low Risk  (09/28/2019)  Tobacco Use: Low Risk  (02/04/2023)    Readmission Risk Interventions     No data to display

## 2023-02-18 NOTE — Progress Notes (Signed)
Physical Therapy Treatment Patient Details Name: Selena Branch MRN: [id number] DOB: April 01, 1941 Today's Date: 02/18/2023   History of Present Illness CAIDENCE DEAS is a 82 y.o. female admitted to [facility] on 5/24 with sudden onset altered mental status/aphasia. WUJ:[id number]; EEG: positive for seizures. PMHx: CLL currently in remission, glaucoma, hypertension, thrombocytopenia, hyperlipidemia [id number]; EEG: positive for seizures.    PT Comments    Pt greeted resting in bed and agreeable to PT/OT session for first session outside with focus on functional transfers and gait with navigation over uneven ground. Pt requiring grossly min A to come to stand to RW and with HHA x1 x7 throughout session, with pt needing mod A for last stand once back in room to power up as pt fatigue increasing. Pt able to progress gait outside x3 trials of increasing distance each trial. Pt requiring mod A with RW for support to manage RW, correct L drift and facilitate forward momentum. Pt needing mod A with HHA x1 on L for 2 longer gait trials with pt able to increase bil foot clearance and increase gait speed with cues. Pt following all one step functional commands throughout session and demonstrating significantly improved engagement in session once up OOB and outside. Discussed with supervision PT and updated recommendation to address deficits and maximize functional independence and decrease caregiver burden. Will continue to follow acutely.    Recommendations for follow up therapy are one component of a multi-disciplinary discharge planning process, led by the attending physician.  Recommendations may be updated based on patient status, additional functional criteria and insurance authorization.  Follow Up Recommendations  Can patient physically be transported by private vehicle: No    Assistance Recommended at Discharge Frequent or constant Supervision/Assistance  Patient can return home with the following A lot of help  with walking and/or transfers;A lot of help with bathing/dressing/bathroom;Direct supervision/assist for medications management;Direct supervision/assist for financial management;Assistance with cooking/housework;Assist for transportation;Help with stairs or ramp for entrance   Equipment Recommendations  Other (comment) (defer to next level of care)    Recommendations for Other Services Rehab consult     Precautions / Restrictions Precautions Precautions: Fall Precaution Comments: seizure Restrictions Weight Bearing Restrictions: No     Mobility  Bed Mobility Overal bed mobility: Needs Assistance Bed Mobility: Supine to Sit     Supine to sit: Min guard, HOB elevated     General bed mobility comments: min guard for safety    Transfers Overall transfer level: Needs assistance Equipment used: Rolling walker (2 wheels), 1 person hand held assist Transfers: Sit to/from Stand Sit to Stand: Min assist, Mod assist           General transfer comment: min A to steady on stand, increased to mod A toward end of session as pt fatigue increasing, standing x7 throughout session with HHA and to RW    Ambulation/Gait Ambulation/Gait assistance: Mod assist Gait Distance (Feet): 25 Feet (+ 30 + 75 +10) Assistive device: Rolling walker (2 wheels), 1 person hand held assist Gait Pattern/deviations: Step-through pattern, Decreased stride length, Shuffle, Trunk flexed Gait velocity: decr     General Gait Details: gait outside at entrance C courtyard, mod A to manage RW and facilitate forward momentum and manage RW as pt drifting L, mod A with HHA on L to steady,  grossly min A for balance with pt shuffling feet needing cues for increased clearance and increased gait speed   Stairs  Wheelchair Mobility    Modified Rankin (Stroke Patients Only)       Balance Overall balance assessment: Needs assistance Sitting-balance support: Single extremity supported, Feet  supported Sitting balance-Leahy Scale: Fair Sitting balance - Comments: sitting EOB   Standing balance support: Bilateral upper extremity supported, Reliant on assistive device for balance Standing balance-Leahy Scale: Poor Standing balance comment: reliant on UE support                            Cognition Arousal/Alertness: Awake/alert Behavior During Therapy: Restless, Flat affect Overall Cognitive Status: Impaired/Different from baseline Area of Impairment: Orientation, Attention, Memory, Following commands, Safety/judgement, Awareness, Problem solving                 Orientation Level: Disoriented to, Place, Time, Situation Current Attention Level: Focused Memory: Decreased recall of precautions, Decreased short-term memory Following Commands: Follows one step commands inconsistently, Follows one step commands with increased time Safety/Judgement: Decreased awareness of safety, Decreased awareness of deficits Awareness: Intellectual Problem Solving: Slow processing, Difficulty sequencing, Requires verbal cues, Requires tactile cues General Comments: pt with flat affect on arrival, becoming more engaged once up OOB and outside        Exercises      General Comments General comments (skin integrity, edema, etc.): VSS on RA, pt with increased alertness and engagement once outside, family present on arrival back to room and supportive      Pertinent Vitals/Pain Pain Assessment Pain Assessment: No/denies pain    Home Living                          Prior Function            PT Goals (current goals can now be found in the care plan section) Acute Rehab PT Goals Patient Stated Goal: pt agreeable to session outside PT Goal Formulation: Patient unable to participate in goal setting Time For Goal Achievement: 02/22/23    Frequency    Min 3X/week      PT Plan Discharge plan needs to be updated    Co-evaluation PT/OT/SLP  Co-Evaluation/Treatment: Yes Reason for Co-Treatment: For patient/therapist safety PT goals addressed during session: Mobility/safety with mobility;Proper use of DME        AM-PAC PT "6 Clicks" Mobility   Outcome Measure  Help needed turning from your back to your side while in a flat bed without using bedrails?: A Lot Help needed moving from lying on your back to sitting on the side of a flat bed without using bedrails?: A Lot Help needed moving to and from a bed to a chair (including a wheelchair)?: A Lot Help needed standing up from a chair using your arms (e.g., wheelchair or bedside chair)?: A Little Help needed to walk in hospital room?: A Lot Help needed climbing 3-5 steps with a railing? : Total 6 Click Score: 12    End of Session Equipment Utilized During Treatment: Gait belt Activity Tolerance: Patient tolerated treatment well Patient left: with call bell/phone within reach;in chair;with chair alarm set;with family/visitor present Nurse Communication: Mobility status PT Visit Diagnosis: Muscle weakness (generalized) (M62.81);Other abnormalities of gait and mobility (R26.89);Unsteadiness on feet (R26.81)     Time: 1914-7829 PT Time Calculation (min) (ACUTE ONLY): 60 min  Charges:  $Gait Training: 23-37 mins                     Emiliya Chretien R.  PTA Acute Rehabilitation Services Office: [phone]   Catalina Antigua 02/18/2023, 12:14 PM

## 2023-02-18 NOTE — Progress Notes (Signed)
Occupational Therapy Treatment Patient Details Name: Selena Branch MRN: [id number] DOB: 16-Aug-1941 Today's Date: 02/18/2023   History of present illness Selena Branch is a 82 y.o. female admitted to [facility] on 5/24 with sudden onset altered mental status/aphasia. [id number]; EEG: positive for seizures. PMHx: CLL currently in remission, glaucoma, hypertension, thrombocytopenia, hyperlipidemia [id number]; EEG: positive for seizures.   OT comments  Pt supine in bed with HOB elevated upon OT arrival. Pt agreeable to participation in skilled OT/PT session outside with OT focusing on cognition and ability to navigate in the environment. OT also educated pt in techniques for increased safety and independence with ADLs in preparation for going outside and upon return to room. Pt with increased alertness and improved active participation this session. Pt demonstrated ability to successfully follow 1 step commands in 7/10 trials and required Mod assist and cues to identify and navigate around obstacles in the outdoor environment. Pt also demonstrated ability to complete UB dressing with Min assist, donn socks with Mod assist, and complete grooming tasks with Min-Mod assist. Pt with Fair balance and good tolerance sitting EOB this day and performing functional transfers/mobility with a RW and with 1 person hand-held assist with Min-Mod assist. Pt participated well and is making progress toward OT goals. Pt will benefit from continued acute skilled OT services. Discharge plan updated. Post acute discharge, pt will benefit from intensive inpatient skilled OT services > 3 hours per day.    Recommendations for follow up therapy are one component of a multi-disciplinary discharge planning process, led by the attending physician.  Recommendations may be updated based on patient status, additional functional criteria and insurance authorization.    Assistance Recommended at Discharge Frequent or constant  Supervision/Assistance  Patient can return home with the following  A lot of help with walking and/or transfers;A lot of help with bathing/dressing/bathroom;Assistance with cooking/housework;Direct supervision/assist for medications management;Direct supervision/assist for financial management;Assist for transportation;Help with stairs or ramp for entrance   Equipment Recommendations  Other (comment) (Defer to next level of care)    Recommendations for Other Services Rehab consult    Precautions / Restrictions Precautions Precautions: Fall Precaution Comments: seizure Restrictions Weight Bearing Restrictions: No       Mobility Bed Mobility Overal bed mobility: Needs Assistance Bed Mobility: Supine to Sit     Supine to sit: Min guard, HOB elevated     General bed mobility comments: min guard for safety    Transfers Overall transfer level: Needs assistance Equipment used: Rolling walker (2 wheels), 1 person hand held assist Transfers: Sit to/from Stand, Bed to chair/wheelchair/BSC Sit to Stand: Min assist, Mod assist Stand pivot transfers: Min assist, Mod assist         General transfer comment: min A to steady on stand, increased to mod A toward end of session as pt fatigue increasing, standing x7 throughout session with HHA and to RW     Balance Overall balance assessment: Needs assistance Sitting-balance support: Single extremity supported, Feet supported Sitting balance-Leahy Scale: Fair Sitting balance - Comments: sitting EOB   Standing balance support: Bilateral upper extremity supported, Reliant on assistive device for balance Standing balance-Leahy Scale: Poor Standing balance comment: reliant on UE support                           ADL either performed or assessed with clinical judgement   ADL Overall ADL's : Needs assistance/impaired     Grooming: Minimal assistance;Moderate assistance;Sitting;Brushing hair (using  shampoo cap)            Upper Body Dressing : Minimal assistance;Sitting   Lower Body Dressing: Moderate assistance;Sitting/lateral leans Lower Body Dressing Details (indicate cue type and reason): to donn socks                    Extremity/Trunk Assessment Upper Extremity Assessment Upper Extremity Assessment: Generalized weakness   Lower Extremity Assessment Lower Extremity Assessment: Defer to PT evaluation        Vision       Perception     Praxis      Cognition Arousal/Alertness: Awake/alert Behavior During Therapy: Restless, Flat affect Overall Cognitive Status: Impaired/Different from baseline Area of Impairment: Orientation, Attention, Memory, Following commands, Safety/judgement, Awareness, Problem solving                 Orientation Level: Disoriented to, Place, Time, Situation Current Attention Level: Focused Memory: Decreased recall of precautions, Decreased short-term memory Following Commands: Follows one step commands inconsistently, Follows one step commands with increased time Safety/Judgement: Decreased awareness of safety, Decreased awareness of deficits Awareness: Intellectual Problem Solving: Slow processing, Difficulty sequencing, Requires verbal cues, Requires tactile cues General Comments: pt with flat affect on arrival, becoming more engaged once up OOB and outside        Exercises      Shoulder Instructions       General Comments Pt demonstrating increased alertness this session once outside. Pt demonstrated ability to follow 1 step commands in 7/10 trials with extra time and occasional verbal or visual cues. Pt currently requiring Mod assist and cues to identify and navigate around obstacles in the environment. Pt VSS on RA throughout session. Pt family in room upon returning from outside.    Pertinent Vitals/ Pain       Pain Assessment Pain Assessment: No/denies pain  Home Living                                           Prior Functioning/Environment              Frequency  Min 2X/week        Progress Toward Goals  OT Goals(current goals can now be found in the care plan section)  Progress towards OT goals: Progressing toward goals  Acute Rehab OT Goals Patient Stated Goal: to do more and go outside again  Plan Discharge plan needs to be updated    Co-evaluation    PT/OT/SLP Co-Evaluation/Treatment: Yes Reason for Co-Treatment: For patient/therapist safety PT goals addressed during session: Mobility/safety with mobility;Proper use of DME OT goals addressed during session: Other (comment);ADL's and self-care (Cognition (following 1 step commands and navigating environment))      AM-PAC OT "6 Clicks" Daily Activity     Outcome Measure   Help from another person eating meals?: A Little Help from another person taking care of personal grooming?: A Little Help from another person toileting, which includes using toliet, bedpan, or urinal?: A Lot Help from another person bathing (including washing, rinsing, drying)?: A Lot Help from another person to put on and taking off regular upper body clothing?: A Little Help from another person to put on and taking off regular lower body clothing?: A Lot 6 Click Score: 15    End of Session Equipment Utilized During Treatment: Gait belt;Rolling walker (2 wheels);Other (comment) (wheelchair for mobility from  unit to/from outside courtyard)  OT Visit Diagnosis: Other abnormalities of gait and mobility (R26.89);Other symptoms and signs involving cognitive function;Cognitive communication deficit (R41.841);Muscle weakness (generalized) (M62.81) Symptoms and signs involving cognitive functions:  (seizures)   Activity Tolerance Patient tolerated treatment well   Patient Left in chair;with call bell/phone within reach;with chair alarm set;with family/visitor present   Nurse Communication Mobility status;Other (comment) (Pt with increased alertness and  participation this session.)        Time: 1610-9604 OT Time Calculation (min): 54 min  Charges: OT General Charges $OT Visit: 1 Visit OT Treatments $Self Care/Home Management : 8-22 mins $Cognitive Funtion inital: Initial 15 mins  [employer] "Orson Eva., OTR/L, MA Acute Rehab [phone]   Lendon Colonel 02/18/2023, 5:23 PM

## 2023-02-18 NOTE — Progress Notes (Signed)
PROGRESS NOTE        PATIENT DETAILS Name: Selena Branch Age: 82 y.o. Sex: female Date of Birth: 04-08-1941 Admit Date: 02/04/2023 Admitting Physician Rodolph Bong, MD ZOX:[id number], Toniann Fail, MD  Brief Summary: Patient is a 82 y.o.  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  Unfortunately-further hospital course complicated by delirium, rectus muscle hematoma with acute blood loss anemia.    Significant studies: 5/24>> CT head: No acute abnormality. 5/24>> CT angio head/neck: No LVO/significant stenosis 5/24>> MRI brain: No acute intracranial process. 6/02>> CT abdomen/pelvis: Pelvic hematoma, intramuscular hemorrhage of right lower rectus muscle, hemoperitoneum suspected.  Significant microbiology data: None  Procedures: 5/24-5/25>> LTM EEG: Status epilepticus-left hemisphere-left parietal region 6/02>> Spot EEG: No definite seizures. 6/02-6/03>> LTM EEG: No seizures  Consults: Neurology  Subjective: More awake/alert today-eating breakfast-less confused. Had significant delirium last evening/overnight-was in soft 2 points restraints wrist  Objective: Vitals: Blood pressure (!) 155/61, pulse 90, temperature (!) 97.5 F (36.4 C), temperature source Oral, resp. rate 20, weight 50.9 kg, SpO2 100 %.   Exam: Gen Exam:Alert awake-not in any distress HEENT:atraumatic, normocephalic Chest: B/L clear to auscultation anteriorly CVS:S1S2 regular Abdomen:soft non tender, non distended Extremities:no edema Neurology: Non focal Skin: no rash  Pertinent Labs/Radiology:    Latest Ref Rng & Units 02/18/2023    5:39 AM 02/17/2023    6:51 AM 02/16/2023    8:41 AM  CBC  WBC 4.0 - 10.5 K/uL 11.3  8.7  9.7   Hemoglobin 12.0 - 15.0 g/dL 09.8  11.9  14.7   Hematocrit 36.0 - 46.0 % 37.5  33.6  35.4   Platelets 150 - 400 K/uL 266  223  212     Lab  Results  Component Value Date   NA 138 02/18/2023   K 3.6 02/18/2023   CL 105 02/18/2023   CO2 21 (L) 02/18/2023      Assessment/Plan: Acute metabolic encephalopathy/expressive aphasia secondary to status epilepticus Status epilepticus has resolved-unfortunately developed severe delirium-likely due to hospital delirium/medications-slowly improving Keppra dosage being adjusted by neurology-reduced to 500 mg twice daily on 6/6-remains on Vimpat.   Neurology following.  Delirium Likely due to acute illness/hospital delirium Slightly less confused today-but continues to have waxing/waning confusion-was significantly agitated yesterday. Continue Seroquel-50 mg twice daily. I have asked the nursing staff to see if we can get a bedside sitter and attempt to discontinue her restraints.  Rectus muscle hematoma/pelvic hematoma/possible hemoperitoneum with acute blood loss anemia Does not flinch-express pain on abdominal exam this morning Hb now stable after multiple units of PRBC transfusion Follow CBC  History of ICH Stable-supportive care-nonfocal exam  History of CLL In remission-WBC remains stable  Hx of ITP Continue Promacta Platelet count stable Appreciate hematology input.  HTN BP stable Coreg/hydralazine  HLD Statin  Acute urine retention UTI Since more awake and alert today-attempt voiding trial today.   Completed Rocephin x 3 days for presumed UTI.  Mood disorder Zoloft  Nutrition Status: Nutrition Problem: Moderate Malnutrition Etiology: chronic illness Signs/Symptoms: mild fat depletion, moderate muscle depletion Interventions: Tube feeding  BMI: Estimated body mass index is 23.45 kg/m as calculated from the following:   Height as of 05/29/21: 4\' 10"  (1.473 m).   Weight as of this encounter: 50.9 kg.  Code status:   Code Status: DNR   DVT Prophylaxis: Place and maintain sequential compression device Start: 02/14/23 0709 SCDs Start: 02/04/23  1729   Family Communication: None at bedside-discussed with numerous family members yesterday.  Disposition Plan: Status is: Inpatient Remains inpatient appropriate because: Severity of illness   Planned Discharge Destination:Skilled nursing facility   Diet: Diet Order             Diet regular Room service appropriate? No; Fluid consistency: Thin  Diet effective now                     Antimicrobial agents: Anti-infectives (From admission, onward)    Start     Dose/Rate Route Frequency Ordered Stop   02/08/23 0600  cefTRIAXone (ROCEPHIN) 1 g in sodium chloride 0.9 % 100 mL IVPB        1 g 200 mL/hr over 30 Minutes Intravenous Every 24 hours 02/07/23 0600 02/09/23 0547   02/07/23 0700  cefTRIAXone (ROCEPHIN) 1 g in sodium chloride 0.9 % 100 mL IVPB        1 g 200 mL/hr over 30 Minutes Intravenous  Once 02/07/23 0601 02/07/23 0645        MEDICATIONS: Scheduled Meds:  amLODipine  10 mg Per Tube QHS   atorvastatin  40 mg Per Tube Daily   carvedilol  3.125 mg Per Tube BID WC   chlorhexidine gluconate (MEDLINE KIT)  15 mL Mouth Rinse BID   Chlorhexidine Gluconate Cloth  6 each Topical Daily   dorzolamide-timolol  1 drop Both Eyes Daily   eltrombopag  25 mg Oral Q M,W,F   feeding supplement  237 mL Oral TID BM   furosemide  20 mg Intravenous Once   hydrALAZINE  25 mg Per Tube Q6H   latanoprost  1 drop Both Eyes QHS   multivitamin with minerals  1 tablet Per Tube Daily   mouth rinse  15 mL Mouth Rinse 10 times per day   pantoprazole (PROTONIX) IV  40 mg Intravenous Q12H   potassium chloride  40 mEq Oral Once   QUEtiapine  50 mg Oral BID   sertraline  25 mg Per Tube Daily   silver sulfADIAZINE   Topical BID   sodium chloride flush  3 mL Intravenous Q12H   tamsulosin  0.4 mg Oral Daily   Continuous Infusions:  lacosamide (VIMPAT) IV 200 mg (02/17/23 2205)   levETIRAcetam 500 mg (02/18/23 0916)   PRN Meds:.acetaminophen **OR** acetaminophen, haloperidol  lactate, hydrALAZINE, midazolam, ondansetron **OR** ondansetron (ZOFRAN) IV, polyethylene glycol, sorbitol   I have personally reviewed following labs and imaging studies  LABORATORY DATA: CBC: Recent Labs  Lab 02/14/23 1743 02/15/23 0540 02/16/23 0841 02/17/23 0651 02/18/23 0539  WBC 10.5 9.8 9.7 8.7 11.3*  HGB 12.3 12.6 11.9* 11.2* 12.3  HCT 35.9* 37.4 35.4* 33.6* 37.5  MCV 88.2 88.6 90.8 91.8 90.6  PLT 203 203 212 223 266     Basic Metabolic Panel: Recent Labs  Lab 02/14/23 0601 02/15/23 0540 02/16/23 0629 02/17/23 0651 02/18/23 0539  NA 139 137 138 139 138  K 3.6 3.7 3.5 3.7 3.6  CL 107 106 107 106 105  CO2 23 21* 19* 23 21*  GLUCOSE 121* 108* 140* 148* 110*  BUN 17 13 20  27* 17  CREATININE 0.66 0.62 0.65 0.69 0.61  CALCIUM 8.1* 8.4* 8.4* 8.3* 8.4*  MG 2.1 2.2 2.2 2.2 2.1  PHOS 3.1 3.2 3.4 4.0 3.0     GFR:  CrCl cannot be calculated (Unknown ideal weight.).  Liver Function Tests: Recent Labs  Lab 02/12/23 1047  AST 31  ALT 20  ALKPHOS 46  BILITOT 0.4  PROT 5.0*  ALBUMIN 3.1*    No results for input(s): "LIPASE", "AMYLASE" in the last 168 hours. No results for input(s): "AMMONIA" in the last 168 hours.   Coagulation Profile: No results for input(s): "INR", "PROTIME" in the last 168 hours.  Cardiac Enzymes: No results for input(s): "CKTOTAL", "CKMB", "CKMBINDEX", "TROPONINI" in the last 168 hours.  BNP (last 3 results) No results for input(s): "PROBNP" in the last 8760 hours.  Lipid Profile: No results for input(s): "CHOL", "HDL", "LDLCALC", "TRIG", "CHOLHDL", "LDLDIRECT" in the last 72 hours.  Thyroid Function Tests: No results for input(s): "TSH", "T4TOTAL", "FREET4", "T3FREE", "THYROIDAB" in the last 72 hours.  Anemia Panel: No results for input(s): "VITAMINB12", "FOLATE", "FERRITIN", "TIBC", "IRON", "RETICCTPCT" in the last 72 hours.  Urine analysis:    Component Value Date/Time   COLORURINE YELLOW 02/04/2023 1723   APPEARANCEUR  HAZY (A) 02/04/2023 1723   LABSPEC 1.013 02/04/2023 1723   PHURINE 6.0 02/04/2023 1723   GLUCOSEU NEGATIVE 02/04/2023 1723   HGBUR LARGE (A) 02/04/2023 1723   BILIRUBINUR NEGATIVE 02/04/2023 1723   KETONESUR NEGATIVE 02/04/2023 1723   PROTEINUR NEGATIVE 02/04/2023 1723   UROBILINOGEN 0.2 05/06/2010 1611   NITRITE NEGATIVE 02/04/2023 1723   LEUKOCYTESUR TRACE (A) 02/04/2023 1723    Sepsis Labs: Lactic Acid, Venous    Component Value Date/Time   LATICACIDVEN 0.8 09/17/2019 0131    MICROBIOLOGY: No results found for this or any previous visit (from the past 240 hour(s)).  RADIOLOGY STUDIES/RESULTS: DG Abd Portable 1V  Result Date: 02/16/2023 CLINICAL DATA:  Feeding tube placement. EXAM: PORTABLE ABDOMEN - 1 VIEW COMPARISON:  02/14/2023 FINDINGS: Feeding tube tip in the region of the gastric pylorus. Normal sized heart. Interval mild linear atelectasis in both lower lung zones. Lower thoracic spine degenerative changes. The included bowel-gas pattern is normal. IMPRESSION: Feeding tube tip in the region of the gastric pylorus. Electronically Signed   By: Beckie Salts M.D.   On: 02/16/2023 16:10     LOS: 13 days   Jeoffrey Massed, MD  [facility]    To contact the attending provider between 7A-7P or the covering provider during after hours 7P-7A, please log into the web site [email or web address] and access using universal [place] password for that web site. If you do not have the password, please call the hospital operator.  02/18/2023, 10:13 AM

## 2023-02-18 NOTE — Progress Notes (Signed)
Subjective: Selena Branch. Has intermittent periods of agitation per Dr Jerral Ralph. Per family at bedside ( husband, daughter, son in law and grand daughter) , patient is improving but has been delusional ( thinks a lawyer wants to kill her, or that her daughter is pregnant)   ROS: negative except above Examination  Vital signs in last 24 hours: Temp:  [97.5 F (36.4 C)-98.2 F (36.8 C)] 97.5 F (36.4 C) (06/07 0816) Pulse Rate:  [85-91] 90 (06/07 0816) Resp:  [17-20] 20 (06/07 0816) BP: (110-157)/(53-98) 155/61 (06/07 0816) SpO2:  [94 %-100 %] 100 % (06/07 0816)  General: sitting in chair, NAD Neuro: MS: Alert, oriented to self, not to place and time, follows commands and able to name objects CN: pupils equal and reactive,  EOMI, face symmetric, tongue midline, normal sensation over face, Motor: 4/5 strength in all 4 extremities  Basic Metabolic Panel: Recent Labs  Lab 02/14/23 0601 02/15/23 0540 02/16/23 0629 02/17/23 0651 02/18/23 0539  NA 139 137 138 139 138  K 3.6 3.7 3.5 3.7 3.6  CL 107 106 107 106 105  CO2 23 21* 19* 23 21*  GLUCOSE 121* 108* 140* 148* 110*  BUN 17 13 20  27* 17  CREATININE 0.66 0.62 0.65 0.69 0.61  CALCIUM 8.1* 8.4* 8.4* 8.3* 8.4*  MG 2.1 2.2 2.2 2.2 2.1  PHOS 3.1 3.2 3.4 4.0 3.0    CBC: Recent Labs  Lab 02/14/23 1743 02/15/23 0540 02/16/23 0841 02/17/23 0651 02/18/23 0539  WBC 10.5 9.8 9.7 8.7 11.3*  HGB 12.3 12.6 11.9* 11.2* 12.3  HCT 35.9* 37.4 35.4* 33.6* 37.5  MCV 88.2 88.6 90.8 91.8 90.6  PLT 203 203 212 223 266    Coagulation Studies: No results for input(s): "LABPROT", "INR" in the last 72 hours.  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     Focal  electrographic status epilepticus, resolved ICH, POA Acute encephalopathy, improving - Seizures most likely secondary to underlying ICH - Status epilepticus has resolved.  EEG was also improved.  However patient continues to be encephalopathic.  This could be secondary to medications, delirium   Recommendations -Will switch Keppra to Depakote 500mg  BID to help with mood stabilization -Continue Vimpat 200 mg twice daily -Continue seizure precautions, delirium precautions -As needed IV Versed for clinical seizures lasting more than 2 minutes -Discussed plan with Dr. Jerral Ralph and family - F/u with neuro in 3 months  Seizure precautions: Per [facility] statutes, patients with seizures are not allowed to drive until they have been seizure-free for six months and cleared by a physician    Use caution when using heavy equipment or power tools. Avoid working on ladders or at heights. Take showers instead of baths. Ensure the water temperature is not too high on the home water heater. Do not go swimming alone. Do not lock yourself in a room alone (i.e. bathroom). When caring for infants or small children, sit down when holding, feeding, or changing them to minimize risk of injury to the child in the event you have a seizure. Maintain good sleep hygiene. Avoid alcohol.    If patient has another seizure, call 911 and bring them back to the ED if: A.  The seizure lasts longer than 5 minutes.  B.  The patient doesn't wake shortly after the seizure or has new problems such as difficulty seeing, speaking or moving following the seizure C.  The patient was injured during the seizure D.  The patient has a temperature over 102 F (39C) E.  The patient vomited during the seizure and now is having trouble breathing    During the Seizure   - First, ensure adequate ventilation and place patients on the floor on their left side  Loosen clothing around the neck and ensure the airway is patent. If the  patient is clenching the teeth, do not force the mouth open with any object as this can cause severe damage - Remove all items from the surrounding that can be hazardous. The patient may be oblivious to what's happening and may not even know what he or she is doing. If the patient is confused and wandering, either gently guide him/her away and block access to outside areas - Reassure the individual and be comforting - Call 911. In most cases, the seizure ends before EMS arrives. However, there are cases when seizures may last over 3 to 5 minutes. Or the individual may have developed breathing difficulties or severe injuries. If a pregnant patient or a person with diabetes develops a seizure, it is prudent to call an ambulance. - Finally, if the patient does not regain full consciousness, then call EMS. Most patients will remain confused for about 45 to 90 minutes after a seizure, so you must use judgment in calling for help.   After the Seizure (Postictal Stage)   After a seizure, most patients experience confusion, fatigue, muscle pain and/or a headache. Thus, one should permit the individual to sleep. For the next few days, reassurance is essential. Being calm and helping reorient the person is also of importance.   Most seizures are painless and end spontaneously. Seizures are not harmful to others but can lead to complications such as stress on the lungs, brain and the heart. Individuals with prior lung problems may develop labored breathing and respiratory distress.     I have spent a total of  35 minutes with the patient reviewing hospital notes,  test results, labs and examining the patient as well as establishing an assessment and plan.  > 50% of time was spent in direct patient care.   Lindie Spruce Epilepsy [facility] For questions after 5pm please refer to AMION to reach the Neurologist on call

## 2023-02-18 NOTE — Progress Notes (Signed)
SLP Cancellation Note  Patient Details Name: Selena Branch MRN: [id number] DOB: [birth year]   Cancelled treatment:        Attempted therapy for swallow and cognition. SLP repositioned pt in bed, provided verbal and painful stimuli and pt could not wake enough to take po's. Sleeping and mumbling and unable to arouse. Will continue efforts.    Royce Macadamia 02/18/2023, 3:09 PM

## 2023-02-18 NOTE — Progress Notes (Signed)
Nutrition Brief Note  Visited patient who was sliding down in her chair and pulling at her lines. She is + for delirium RN was present and RD helped reposition patient. RN reports her mentation has been waxing and waning and that she managed to eat some bacon this morning but refuses to drink ensure.   RD to trial Magic cup TID with meals, each supplement provides 290 kcal and 9 grams of protein And d/c Ensure for now.   Patient's RN reports she feeds herself fine, her mentation and agitation just continue wax and wane. RN reports that patient's family is not interested in replacing Cortrak at this time. RD is concerned that if patient did have Cortrak replaced, she would pull it out again.   Labs reviewed: Glu 110 Meds reviewed and include: lipitor, ensure TID, lasix, MVI, protonix, klor-con, NS  Leodis Rains, RDN, LDN  Clinical Nutrition

## 2023-02-18 NOTE — Progress Notes (Addendum)
Inpatient Rehab Admissions Coordinator :  Per therapy change in recommendations today, patient was screened for CIR candidacy by Ottie Glazier RN MSN.  At this time patient appears to be a potential candidate for CIR. I will place a rehab consult per protocol for full assessment. Please call me with any questions.  Ottie Glazier RN MSN Admissions Coordinator [phone]

## 2023-02-19 DIAGNOSIS — R569 Unspecified convulsions: Secondary | ICD-10-CM | POA: Diagnosis not present

## 2023-02-19 DIAGNOSIS — I10 Essential (primary) hypertension: Secondary | ICD-10-CM | POA: Diagnosis not present

## 2023-02-19 DIAGNOSIS — S301XXS Contusion of abdominal wall, sequela: Secondary | ICD-10-CM | POA: Diagnosis not present

## 2023-02-19 DIAGNOSIS — D696 Thrombocytopenia, unspecified: Secondary | ICD-10-CM | POA: Diagnosis not present

## 2023-02-19 LAB — BASIC METABOLIC PANEL
Anion gap: 11 (ref 5–15)
BUN: 23 mg/dL (ref 8–23)
CO2: 21 mmol/L — ABNORMAL LOW (ref 22–32)
Calcium: 8.3 mg/dL — ABNORMAL LOW (ref 8.9–10.3)
Chloride: 105 mmol/L (ref 98–111)
Creatinine, Ser: 0.72 mg/dL (ref 0.44–1.00)
GFR, Estimated: >60 mL/min (ref >60)
Glucose, Bld: 99 mg/dL (ref 70–99)
Potassium: 3.9 mmol/L (ref 3.5–5.1)
Sodium: 137 mmol/L (ref 135–145)

## 2023-02-19 LAB — GLUCOSE, CAPILLARY
Glucose-Capillary: 111 mg/dL — ABNORMAL HIGH (ref 70–99)
Glucose-Capillary: 159 mg/dL — ABNORMAL HIGH (ref 70–99)

## 2023-02-19 NOTE — Progress Notes (Signed)
Pt hallucinating this evening, seeing snakes in her room.  PRN Haldol IV given.  Pt is calmer now, but still endorses seeing snakes.  She is able to tell me that she is in the hospital at [facility] in [facility].  Tele sitter continues.  Mittens in place to both hands.    02/19/23 1932  Vitals  Temp 97.6 F (36.4 C)  Temp Source Oral  BP (!) 134/52  MAP (mmHg) 75  BP Location Right Arm  BP Method Automatic  Patient Position (if appropriate) Lying  Pulse Rate 87  Pulse Rate Source Monitor  Resp 18  Level of Consciousness  Level of Consciousness Alert  MEWS COLOR  MEWS Score Color Green  Oxygen Therapy  SpO2 92 %  O2 Device Room Air  Pain Assessment  Pain Scale 0-10  Pain Score 0  POSS Scale (Pasero Opioid Sedation Scale)  POSS *See Group Information* 1-Acceptable,Awake and alert  MEWS Score  MEWS Temp 0  MEWS Systolic 0  MEWS Pulse 0  MEWS RR 0  MEWS LOC 0  MEWS Score 0   Hilton Sinclair BSN RN [facility] 02/19/2023, 7:59 PM

## 2023-02-19 NOTE — Progress Notes (Signed)
Inpatient Rehab Admissions Coordinator:    I spoke with Pt.'s daughter over the phone and saw Pt. At bedside. They want to pursue CIR. Pt.'s daughter works from home and between her and Pt.'s spouse, then can provide 24/7 assist at d/c. I will follow and pursue for potential admit pending insurance auth.   Megan Salon, MS, CCC-SLP Rehab Admissions Coordinator  [phone] (celll) [phone] (office)

## 2023-02-19 NOTE — Progress Notes (Signed)
PROGRESS NOTE        PATIENT DETAILS Name: Selena Branch Age: 82 y.o. Sex: female Date of Birth: 1941/01/27 Admit Date: 02/04/2023 Admitting Physician Rodolph Bong, MD [id number], Toniann Fail, MD  Brief Summary: Patient is a 82 y.o.  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  Unfortunately-further hospital course complicated by delirium, rectus muscle hematoma with acute blood loss anemia.    Significant studies: 5/24>> CT head: No acute abnormality. 5/24>> CT angio head/neck: No LVO/significant stenosis 5/24>> MRI brain: No acute intracranial process. 6/02>> CT abdomen/pelvis: Pelvic hematoma, intramuscular hemorrhage of right lower rectus muscle, hemoperitoneum suspected.  Significant microbiology data: None  Procedures: 5/24-5/25>> LTM EEG: Status epilepticus-left hemisphere-left parietal region 6/02>> Spot EEG: No definite seizures. 6/02-6/03>> LTM EEG: No seizures  Consults: Neurology  Subjective: No major issues overnight-minimally but pleasantly confused.  Did not require restraints last night.  Foley catheter removed yesterday-had 2 incontinent urine subsequently.  Objective: Vitals: Blood pressure 133/60, pulse 100, temperature 98.3 F (36.8 C), temperature source Oral, resp. rate 20, weight 50.9 kg, SpO2 96 %.   Exam: Gen Exam:Alert awake-not in any distress HEENT:atraumatic, normocephalic Chest: B/L clear to auscultation anteriorly CVS:S1S2 regular Abdomen:soft non tender, non distended Extremities:no edema Neurology: Non focal Skin: no rash  Pertinent Labs/Radiology:    Latest Ref Rng & Units 02/18/2023    5:39 AM 02/17/2023    6:51 AM 02/16/2023    8:41 AM  CBC  WBC 4.0 - 10.5 K/uL 11.3  8.7  9.7   Hemoglobin 12.0 - 15.0 g/dL 56.2  13.0  86.5   Hematocrit 36.0 - 46.0 % 37.5  33.6  35.4   Platelets 150 - 400 K/uL 266   223  212     Lab Results  Component Value Date   NA 137 02/19/2023   K 3.9 02/19/2023   CL 105 02/19/2023   CO2 21 (L) 02/19/2023      Assessment/Plan: Acute metabolic encephalopathy/expressive aphasia secondary to status epilepticus Status epilepticus has resolved-unfortunately developed severe delirium-likely due to hospital delirium/medications Keppra switched to Depakote due to delirium, remains on Vimpat Appreciate neurology input.  Delirium Likely due to acute illness/hospital delirium Continues to slowly improve over the past several days-much more awake and alert today Keppra switched to Depakote Remains on Seroquel 50 mg twice daily No longer on restraints-no sitter in place for the past several days.  Rectus muscle hematoma/pelvic hematoma/possible hemoperitoneum with acute blood loss anemia Does not flinch-express pain on abdominal exam this morning Hb now stable after multiple units of PRBC transfusion Follow CBC  History of ICH Stable-supportive care-nonfocal exam  History of CLL In remission-WBC remains stable  Hx of ITP Continue Promacta Platelet count stable Appreciate hematology input.  HTN BP stable Coreg/hydralazine  HLD Statin  Acute urine retention UTI Foley removed 6/7-apparently had 2 incontinent urine post removal. Completed Rocephin x 3 days for presumed UTI.  Mood disorder Zoloft  Nutrition Status: Nutrition Problem: Moderate Malnutrition Etiology: chronic illness Signs/Symptoms: mild fat depletion, moderate muscle depletion Interventions: Tube feeding  BMI: Estimated body mass index is 23.45 kg/m as calculated from the following:   Height as of 05/29/21: 4\' 10"  (1.473 m).   Weight as of this encounter: 50.9 kg.   Code status:   Code  Status: DNR   DVT Prophylaxis: Place and maintain sequential compression device Start: 02/14/23 0709 SCDs Start: 02/04/23 1729   Family Communication: None at bedside-discussed with  numerous family members yesterday.  Disposition Plan: Status is: Inpatient Remains inpatient appropriate because: Severity of illness   Planned Discharge Destination:Skilled nursing facility   Diet: Diet Order             Diet regular Room service appropriate? No; Fluid consistency: Thin  Diet effective now                     Antimicrobial agents: Anti-infectives (From admission, onward)    Start     Dose/Rate Route Frequency Ordered Stop   02/08/23 0600  cefTRIAXone (ROCEPHIN) 1 g in sodium chloride 0.9 % 100 mL IVPB        1 g 200 mL/hr over 30 Minutes Intravenous Every 24 hours 02/07/23 0600 02/09/23 0547   02/07/23 0700  cefTRIAXone (ROCEPHIN) 1 g in sodium chloride 0.9 % 100 mL IVPB        1 g 200 mL/hr over 30 Minutes Intravenous  Once 02/07/23 0601 02/07/23 0645        MEDICATIONS: Scheduled Meds:  amLODipine  10 mg Per Tube QHS   atorvastatin  40 mg Per Tube Daily   carvedilol  3.125 mg Per Tube BID WC   chlorhexidine gluconate (MEDLINE KIT)  15 mL Mouth Rinse BID   Chlorhexidine Gluconate Cloth  6 each Topical Daily   dorzolamide-timolol  1 drop Both Eyes Daily   eltrombopag  25 mg Oral Q M,W,F   furosemide  20 mg Intravenous Once   hydrALAZINE  25 mg Per Tube Q6H   latanoprost  1 drop Both Eyes QHS   multivitamin with minerals  1 tablet Per Tube Daily   pantoprazole (PROTONIX) IV  40 mg Intravenous Q12H   potassium chloride  40 mEq Oral Once   QUEtiapine  50 mg Oral BID   sertraline  25 mg Per Tube Daily   silver sulfADIAZINE   Topical BID   sodium chloride flush  3 mL Intravenous Q12H   tamsulosin  0.4 mg Oral Daily   Continuous Infusions:  lacosamide (VIMPAT) IV Stopped (02/18/23 2324)   valproate sodium Stopped (02/18/23 2218)   PRN Meds:.acetaminophen **OR** acetaminophen, haloperidol lactate, hydrALAZINE, midazolam, ondansetron **OR** ondansetron (ZOFRAN) IV, polyethylene glycol, sorbitol   I have personally reviewed following labs and  imaging studies  LABORATORY DATA: CBC: Recent Labs  Lab 02/14/23 1743 02/15/23 0540 02/16/23 0841 02/17/23 0651 02/18/23 0539  WBC 10.5 9.8 9.7 8.7 11.3*  HGB 12.3 12.6 11.9* 11.2* 12.3  HCT 35.9* 37.4 35.4* 33.6* 37.5  MCV 88.2 88.6 90.8 91.8 90.6  PLT 203 203 212 223 266     Basic Metabolic Panel: Recent Labs  Lab 02/14/23 0601 02/15/23 0540 02/16/23 0629 02/17/23 0651 02/18/23 0539 02/19/23 0122  NA 139 137 138 139 138 137  K 3.6 3.7 3.5 3.7 3.6 3.9  CL 107 106 107 106 105 105  CO2 23 21* 19* 23 21* 21*  GLUCOSE 121* 108* 140* 148* 110* 99  BUN 17 13 20  27* 17 23  CREATININE 0.66 0.62 0.65 0.69 0.61 0.72  CALCIUM 8.1* 8.4* 8.4* 8.3* 8.4* 8.3*  MG 2.1 2.2 2.2 2.2 2.1  --   PHOS 3.1 3.2 3.4 4.0 3.0  --      GFR: CrCl cannot be calculated (Unknown ideal weight.).  Liver Function Tests: Recent Labs  Lab 02/12/23 1047  AST 31  ALT 20  ALKPHOS 46  BILITOT 0.4  PROT 5.0*  ALBUMIN 3.1*    No results for input(s): "LIPASE", "AMYLASE" in the last 168 hours. No results for input(s): "AMMONIA" in the last 168 hours.   Coagulation Profile: No results for input(s): "INR", "PROTIME" in the last 168 hours.  Cardiac Enzymes: No results for input(s): "CKTOTAL", "CKMB", "CKMBINDEX", "TROPONINI" in the last 168 hours.  BNP (last 3 results) No results for input(s): "PROBNP" in the last 8760 hours.  Lipid Profile: No results for input(s): "CHOL", "HDL", "LDLCALC", "TRIG", "CHOLHDL", "LDLDIRECT" in the last 72 hours.  Thyroid Function Tests: No results for input(s): "TSH", "T4TOTAL", "FREET4", "T3FREE", "THYROIDAB" in the last 72 hours.  Anemia Panel: No results for input(s): "VITAMINB12", "FOLATE", "FERRITIN", "TIBC", "IRON", "RETICCTPCT" in the last 72 hours.  Urine analysis:    Component Value Date/Time   COLORURINE YELLOW 02/04/2023 1723   APPEARANCEUR HAZY (A) 02/04/2023 1723   LABSPEC 1.013 02/04/2023 1723   PHURINE 6.0 02/04/2023 1723   GLUCOSEU  NEGATIVE 02/04/2023 1723   HGBUR LARGE (A) 02/04/2023 1723   BILIRUBINUR NEGATIVE 02/04/2023 1723   KETONESUR NEGATIVE 02/04/2023 1723   PROTEINUR NEGATIVE 02/04/2023 1723   UROBILINOGEN 0.2 05/06/2010 1611   NITRITE NEGATIVE 02/04/2023 1723   LEUKOCYTESUR TRACE (A) 02/04/2023 1723    Sepsis Labs: Lactic Acid, Venous    Component Value Date/Time   LATICACIDVEN 0.8 09/17/2019 0131    MICROBIOLOGY: No results found for this or any previous visit (from the past 240 hour(s)).  RADIOLOGY STUDIES/RESULTS: No results found.   LOS: 14 days   Jeoffrey Massed, MD  [facility]    To contact the attending provider between 7A-7P or the covering provider during after hours 7P-7A, please log into the web site [email or web address] and access using universal [place] password for that web site. If you do not have the password, please call the hospital operator.  02/19/2023, 9:58 AM

## 2023-02-20 DIAGNOSIS — D696 Thrombocytopenia, unspecified: Secondary | ICD-10-CM | POA: Diagnosis not present

## 2023-02-20 DIAGNOSIS — S301XXS Contusion of abdominal wall, sequela: Secondary | ICD-10-CM | POA: Diagnosis not present

## 2023-02-20 DIAGNOSIS — I10 Essential (primary) hypertension: Secondary | ICD-10-CM | POA: Diagnosis not present

## 2023-02-20 DIAGNOSIS — R569 Unspecified convulsions: Secondary | ICD-10-CM | POA: Diagnosis not present

## 2023-02-20 LAB — BASIC METABOLIC PANEL
Anion gap: 9 (ref 5–15)
BUN: 22 mg/dL (ref 8–23)
CO2: 24 mmol/L (ref 22–32)
Calcium: 9 mg/dL (ref 8.9–10.3)
Chloride: 104 mmol/L (ref 98–111)
Creatinine, Ser: 0.7 mg/dL (ref 0.44–1.00)
GFR, Estimated: >60 mL/min (ref >60)
Glucose, Bld: 121 mg/dL — ABNORMAL HIGH (ref 70–99)
Potassium: 3.8 mmol/L (ref 3.5–5.1)
Sodium: 137 mmol/L (ref 135–145)

## 2023-02-20 MED ORDER — HALOPERIDOL LACTATE 5 MG/ML IJ SOLN
2.0000 mg | Freq: Four times a day (QID) | INTRAMUSCULAR | Status: DC | PRN
  Administered 2023-02-25: 2 mg via INTRAVENOUS
  Filled 2023-02-20: qty 1

## 2023-02-20 MED ORDER — HYDRALAZINE HCL 25 MG PO TABS
25.0000 mg | ORAL_TABLET | Freq: Three times a day (TID) | ORAL | Status: DC
  Administered 2023-02-20 – 2023-02-28 (×21): 25 mg via ORAL
  Filled 2023-02-20 (×22): qty 1

## 2023-02-20 MED ORDER — ZIPRASIDONE MESYLATE 20 MG IM SOLR
10.0000 mg | Freq: Four times a day (QID) | INTRAMUSCULAR | Status: DC | PRN
  Administered 2023-02-20: 10 mg via INTRAMUSCULAR
  Filled 2023-02-20 (×3): qty 20

## 2023-02-20 MED ORDER — MELATONIN 5 MG PO TABS
5.0000 mg | ORAL_TABLET | Freq: Every day | ORAL | Status: DC
  Administered 2023-02-20 – 2023-02-27 (×8): 5 mg via ORAL
  Filled 2023-02-20 (×9): qty 1

## 2023-02-20 MED ORDER — HALOPERIDOL LACTATE 5 MG/ML IJ SOLN: 2.0000 mg | Freq: Four times a day (QID) | INTRAMUSCULAR | Status: DC | PRN

## 2023-02-20 MED ORDER — AMLODIPINE BESYLATE 10 MG PO TABS
10.0000 mg | ORAL_TABLET | Freq: Every day | ORAL | Status: DC
  Administered 2023-02-20 – 2023-02-27 (×8): 10 mg via ORAL
  Filled 2023-02-20 (×9): qty 1

## 2023-02-20 MED ORDER — CARVEDILOL 3.125 MG PO TABS
3.1250 mg | ORAL_TABLET | Freq: Two times a day (BID) | ORAL | Status: DC
  Administered 2023-02-20 – 2023-02-28 (×15): 3.125 mg via ORAL
  Filled 2023-02-20 (×16): qty 1

## 2023-02-20 NOTE — Progress Notes (Signed)
Pt pulled out her IV and keeps trying to remove telemetry wires.  She claims that I am a murderer and that everyone else is accomplices.  She is difficult to redirect.  Dr. Arville Care made aware and order for IM Geodon obtained.  Pt has been 1:1 for the last 1 1/2 hours.   Hilton Sinclair BSN RN CMSRN 02/20/2023, 4:44 AM

## 2023-02-20 NOTE — Progress Notes (Signed)
Pt rested well after Haldol until around 2330.  She no longer saw snakes, but was very focused on "getting a divorce".  Initially was redirectable, but then kept trying to get out of bed and pull at lines/tubes.  Haldol repeated at 0139 and pt is resting calmly at this time.  Dr Arville Care made aware that telemetry was resumed in light of Haldol doses.   Hilton Sinclair BSN RN CMSRN 02/20/2023, 1:59 AM

## 2023-02-20 NOTE — Progress Notes (Signed)
PROGRESS NOTE        PATIENT DETAILS Name: Selena Branch Age: 82 y.o. Sex: female Date of Birth: October 31, 1940 Admit Date: 02/04/2023 Admitting Physician Rodolph Bong, MD [id number], Toniann Fail, MD  Brief Summary: Patient is a 82 y.o.  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  Unfortunately-further hospital course complicated by delirium, rectus muscle hematoma with acute blood loss anemia.    Significant studies: 5/24>> CT head: No acute abnormality. 5/24>> CT angio head/neck: No LVO/significant stenosis 5/24>> MRI brain: No acute intracranial process. 6/02>> CT abdomen/pelvis: Pelvic hematoma, intramuscular hemorrhage of right lower rectus muscle, hemoperitoneum suspected.  Significant microbiology data: None  Procedures: 5/24-5/25>> LTM EEG: Status epilepticus-left hemisphere-left parietal region 6/02>> Spot EEG: No definite seizures. 6/02-6/03>> LTM EEG: No seizures  Consults: Neurology  Subjective: Some delirium last night-but calm/quiet this morning.  No family at bedside.  She was seen twice-second time she was sitting up in bed and eating breakfast by herself.  Objective: Vitals: Blood pressure (!) 131/47, pulse 86, temperature 97.8 F (36.6 C), temperature source Oral, resp. rate 20, weight 49.6 kg, SpO2 91 %.   Exam: Gen Exam:not in any distress HEENT:atraumatic, normocephalic Chest: B/L clear to auscultation anteriorly CVS:S1S2 regular Abdomen:soft non tender, non distended Extremities:no edema Neurology: Non focal Skin: no rash  Pertinent Labs/Radiology:    Latest Ref Rng & Units 02/18/2023    5:39 AM 02/17/2023    6:51 AM 02/16/2023    8:41 AM  CBC  WBC 4.0 - 10.5 K/uL 11.3  8.7  9.7   Hemoglobin 12.0 - 15.0 g/dL 09.8  11.9  14.7   Hematocrit 36.0 - 46.0 % 37.5  33.6  35.4   Platelets 150 - 400 K/uL 266  223  212      Lab Results  Component Value Date   NA 137 02/20/2023   K 3.8 02/20/2023   CL 104 02/20/2023   CO2 24 02/20/2023      Assessment/Plan: Acute metabolic encephalopathy/expressive aphasia secondary to status epilepticus Status epilepticus has resolved-unfortunately developed severe delirium-likely due to hospital delirium/medications Keppra switched to Depakote due to delirium, remains on Vimpat Appreciate neurology input.  Delirium Likely due to acute illness/hospital delirium Overall much better but had significant delirium last night Add melatonin Keep on Seroquel 50 twice daily-if she continues to have delirium issues-May need to be switched to alternative medication.    Rectus muscle hematoma/pelvic hematoma/possible hemoperitoneum with acute blood loss anemia Does not flinch-express pain on abdominal exam this morning Hb now stable after multiple units of PRBC transfusion Follow CBC  History of ICH Stable-supportive care-nonfocal exam  History of CLL In remission-WBC remains stable  Hx of ITP Continue Promacta Platelet count stable Appreciate hematology input.  HTN BP stable Coreg/hydralazine  HLD Statin  Acute urine retention UTI Foley removed 6/7-apparently had 2 incontinent urine post removal. Completed Rocephin x 3 days for presumed UTI.  Mood disorder Zoloft  Nutrition Status: Nutrition Problem: Moderate Malnutrition Etiology: chronic illness Signs/Symptoms: mild fat depletion, moderate muscle depletion Interventions: Tube feeding  BMI: Estimated body mass index is 22.85 kg/m as calculated from the following:   Height as of 05/29/21: 4\' 10"  (1.473 m).   Weight as of this encounter: 49.6 kg.   Code status:   Code  Status: DNR   DVT Prophylaxis: Place and maintain sequential compression device Start: 02/14/23 0709 SCDs Start: 02/04/23 1729   Family Communication: None at bedside-discussed with numerous family members  yesterday.  Disposition Plan: Status is: Inpatient Remains inpatient appropriate because: Severity of illness   Planned Discharge Destination:Skilled nursing facility   Diet: Diet Order             Diet regular Room service appropriate? No; Fluid consistency: Thin  Diet effective now                     Antimicrobial agents: Anti-infectives (From admission, onward)    Start     Dose/Rate Route Frequency Ordered Stop   02/08/23 0600  cefTRIAXone (ROCEPHIN) 1 g in sodium chloride 0.9 % 100 mL IVPB        1 g 200 mL/hr over 30 Minutes Intravenous Every 24 hours 02/07/23 0600 02/09/23 0547   02/07/23 0700  cefTRIAXone (ROCEPHIN) 1 g in sodium chloride 0.9 % 100 mL IVPB        1 g 200 mL/hr over 30 Minutes Intravenous  Once 02/07/23 0601 02/07/23 0645        MEDICATIONS: Scheduled Meds:  amLODipine  10 mg Oral QHS   atorvastatin  40 mg Per Tube Daily   carvedilol  3.125 mg Oral BID WC   chlorhexidine gluconate (MEDLINE KIT)  15 mL Mouth Rinse BID   Chlorhexidine Gluconate Cloth  6 each Topical Daily   dorzolamide-timolol  1 drop Both Eyes Daily   eltrombopag  25 mg Oral Q M,W,F   hydrALAZINE  25 mg Oral Q8H   latanoprost  1 drop Both Eyes QHS   melatonin  5 mg Oral QHS   multivitamin with minerals  1 tablet Per Tube Daily   pantoprazole (PROTONIX) IV  40 mg Intravenous Q12H   potassium chloride  40 mEq Oral Once   QUEtiapine  50 mg Oral BID   sertraline  25 mg Per Tube Daily   silver sulfADIAZINE   Topical BID   sodium chloride flush  3 mL Intravenous Q12H   tamsulosin  0.4 mg Oral Daily   Continuous Infusions:  lacosamide (VIMPAT) IV 200 mg (02/19/23 2342)   valproate sodium 500 mg (02/20/23 0931)   PRN Meds:.acetaminophen **OR** acetaminophen, haloperidol lactate **OR** haloperidol lactate, hydrALAZINE, midazolam, ondansetron **OR** ondansetron (ZOFRAN) IV, polyethylene glycol, sorbitol   I have personally reviewed following labs and imaging  studies  LABORATORY DATA: CBC: Recent Labs  Lab 02/14/23 1743 02/15/23 0540 02/16/23 0841 02/17/23 0651 02/18/23 0539  WBC 10.5 9.8 9.7 8.7 11.3*  HGB 12.3 12.6 11.9* 11.2* 12.3  HCT 35.9* 37.4 35.4* 33.6* 37.5  MCV 88.2 88.6 90.8 91.8 90.6  PLT 203 203 212 223 266     Basic Metabolic Panel: Recent Labs  Lab 02/14/23 0601 02/15/23 0540 02/16/23 0629 02/17/23 0651 02/18/23 0539 02/19/23 0122 02/20/23 0529  NA 139 137 138 139 138 137 137  K 3.6 3.7 3.5 3.7 3.6 3.9 3.8  CL 107 106 107 106 105 105 104  CO2 23 21* 19* 23 21* 21* 24  GLUCOSE 121* 108* 140* 148* 110* 99 121*  BUN 17 13 20  27* 17 23 22   CREATININE 0.66 0.62 0.65 0.69 0.61 0.72 0.70  CALCIUM 8.1* 8.4* 8.4* 8.3* 8.4* 8.3* 9.0  MG 2.1 2.2 2.2 2.2 2.1  --   --   PHOS 3.1 3.2 3.4 4.0 3.0  --   --  GFR: CrCl cannot be calculated (Unknown ideal weight.).  Liver Function Tests: No results for input(s): "AST", "ALT", "ALKPHOS", "BILITOT", "PROT", "ALBUMIN" in the last 168 hours.  No results for input(s): "LIPASE", "AMYLASE" in the last 168 hours. No results for input(s): "AMMONIA" in the last 168 hours.   Coagulation Profile: No results for input(s): "INR", "PROTIME" in the last 168 hours.  Cardiac Enzymes: No results for input(s): "CKTOTAL", "CKMB", "CKMBINDEX", "TROPONINI" in the last 168 hours.  BNP (last 3 results) No results for input(s): "PROBNP" in the last 8760 hours.  Lipid Profile: No results for input(s): "CHOL", "HDL", "LDLCALC", "TRIG", "CHOLHDL", "LDLDIRECT" in the last 72 hours.  Thyroid Function Tests: No results for input(s): "TSH", "T4TOTAL", "FREET4", "T3FREE", "THYROIDAB" in the last 72 hours.  Anemia Panel: No results for input(s): "VITAMINB12", "FOLATE", "FERRITIN", "TIBC", "IRON", "RETICCTPCT" in the last 72 hours.  Urine analysis:    Component Value Date/Time   COLORURINE YELLOW 02/04/2023 1723   APPEARANCEUR HAZY (A) 02/04/2023 1723   LABSPEC 1.013 02/04/2023 1723    PHURINE 6.0 02/04/2023 1723   GLUCOSEU NEGATIVE 02/04/2023 1723   HGBUR LARGE (A) 02/04/2023 1723   BILIRUBINUR NEGATIVE 02/04/2023 1723   KETONESUR NEGATIVE 02/04/2023 1723   PROTEINUR NEGATIVE 02/04/2023 1723   UROBILINOGEN 0.2 05/06/2010 1611   NITRITE NEGATIVE 02/04/2023 1723   LEUKOCYTESUR TRACE (A) 02/04/2023 1723    Sepsis Labs: Lactic Acid, Venous    Component Value Date/Time   LATICACIDVEN 0.8 09/17/2019 0131    MICROBIOLOGY: No results found for this or any previous visit (from the past 240 hour(s)).  RADIOLOGY STUDIES/RESULTS: No results found.   LOS: 15 days   Jeoffrey Massed, MD  [facility]    To contact the attending provider between 7A-7P or the covering provider during after hours 7P-7A, please log into the web site [email or web address] and access using universal [place] password for that web site. If you do not have the password, please call the hospital operator.  02/20/2023, 10:11 AM

## 2023-02-20 NOTE — Plan of Care (Signed)
  Problem: Education: Goal: Expressions of having a comfortable level of knowledge regarding the disease process will increase Outcome: Progressing   Problem: Coping: Goal: Ability to adjust to condition or change in health will improve Outcome: Progressing Goal: Ability to identify appropriate support needs will improve Outcome: Progressing   Problem: Health Behavior/Discharge Planning: Goal: Compliance with prescribed medication regimen will improve Outcome: Progressing   Problem: Medication: Goal: Risk for medication side effects will decrease Outcome: Progressing   Problem: Clinical Measurements: Goal: Complications related to the disease process, condition or treatment will be avoided or minimized Outcome: Progressing Goal: Diagnostic test results will improve Outcome: Progressing   Problem: Safety: Goal: Verbalization of understanding the information provided will improve Outcome: Progressing   Problem: Self-Concept: Goal: Level of anxiety will decrease Outcome: Progressing Goal: Ability to verbalize feelings about condition will improve Outcome: Progressing   Problem: Education: Goal: Knowledge of General Education information will improve Description: Including pain rating scale, medication(s)/side effects and non-pharmacologic comfort measures Outcome: Progressing   Problem: Health Behavior/Discharge Planning: Goal: Ability to manage health-related needs will improve Outcome: Progressing   Problem: Clinical Measurements: Goal: Ability to maintain clinical measurements within normal limits will improve Outcome: Progressing Goal: Will remain free from infection Outcome: Progressing Goal: Diagnostic test results will improve Outcome: Progressing Goal: Respiratory complications will improve Outcome: Progressing Goal: Cardiovascular complication will be avoided Outcome: Progressing   Problem: Activity: Goal: Risk for activity intolerance will  decrease Outcome: Progressing   Problem: Nutrition: Goal: Adequate nutrition will be maintained Outcome: Progressing   Problem: Coping: Goal: Level of anxiety will decrease Outcome: Progressing   Problem: Elimination: Goal: Will not experience complications related to bowel motility Outcome: Progressing Goal: Will not experience complications related to urinary retention Outcome: Progressing   Problem: Pain Managment: Goal: General experience of comfort will improve Outcome: Progressing   Problem: Safety: Goal: Ability to remain free from injury will improve Outcome: Progressing   Problem: Skin Integrity: Goal: Risk for impaired skin integrity will decrease Outcome: Progressing   Problem: Safety: Goal: Non-violent Restraint(s) Outcome: Progressing   

## 2023-02-21 ENCOUNTER — Inpatient Hospital Stay (HOSPITAL_COMMUNITY): Payer: PPO

## 2023-02-21 DIAGNOSIS — D696 Thrombocytopenia, unspecified: Secondary | ICD-10-CM | POA: Diagnosis not present

## 2023-02-21 DIAGNOSIS — I10 Essential (primary) hypertension: Secondary | ICD-10-CM | POA: Diagnosis not present

## 2023-02-21 DIAGNOSIS — R410 Disorientation, unspecified: Secondary | ICD-10-CM | POA: Diagnosis not present

## 2023-02-21 DIAGNOSIS — R569 Unspecified convulsions: Secondary | ICD-10-CM | POA: Diagnosis not present

## 2023-02-21 DIAGNOSIS — R5381 Other malaise: Secondary | ICD-10-CM | POA: Diagnosis not present

## 2023-02-21 DIAGNOSIS — S301XXS Contusion of abdominal wall, sequela: Secondary | ICD-10-CM | POA: Diagnosis not present

## 2023-02-21 LAB — VALPROIC ACID LEVEL: Valproic Acid Lvl: 92 ug/mL (ref 50.0–100.0)

## 2023-02-21 LAB — AMMONIA: Ammonia: 65 umol/L — ABNORMAL HIGH (ref 9–35)

## 2023-02-21 MED ORDER — LACTULOSE 10 GM/15ML PO SOLN
10.0000 g | Freq: Two times a day (BID) | ORAL | Status: AC
  Administered 2023-02-21 – 2023-02-23 (×4): 10 g via ORAL
  Filled 2023-02-21 (×4): qty 30

## 2023-02-21 MED ORDER — DIVALPROEX SODIUM 500 MG PO DR TAB
500.0000 mg | DELAYED_RELEASE_TABLET | Freq: Every day | ORAL | Status: DC
  Administered 2023-02-21 – 2023-02-22 (×2): 500 mg via ORAL
  Filled 2023-02-21 (×2): qty 1

## 2023-02-21 MED ORDER — DIVALPROEX SODIUM 250 MG PO DR TAB
250.0000 mg | DELAYED_RELEASE_TABLET | Freq: Every morning | ORAL | Status: DC
  Administered 2023-02-22 – 2023-02-23 (×2): 250 mg via ORAL
  Filled 2023-02-21 (×2): qty 1

## 2023-02-21 NOTE — Progress Notes (Signed)
PT Cancellation Note  Patient Details Name: Selena Branch MRN: [id number] DOB: 30-Sep-1940   Cancelled Treatment:    Reason Eval/Treat Not Completed: (P) Pain limiting ability to participate;Patient at procedure or test/unavailable, pt lethargic and unable to be woken for session in AM, EEG team present in PM placing LT EEG. Will check back as schedule allows to continue with PT POC.  Lenora Boys. PTA Acute Rehabilitation Services Office: [phone]    Catalina Antigua 02/21/2023, 2:48 PM

## 2023-02-21 NOTE — Progress Notes (Signed)
Inpatient Rehab Admissions Coordinator:    CIR following. I spoke with Dr. Logan Bores at [facility] who feels pt. Needs a few more days on acute before case will be ready to review for CIR. Mentation continues to wax/wane, but pt. Did do well with just telesitter overnight.   Megan Salon, MS, CCC-SLP Rehab Admissions Coordinator  [phone] (celll) [phone] (office)

## 2023-02-21 NOTE — Progress Notes (Addendum)
Subjective: Was.  Drowsy, did open her eyes but did not answer any of my questions.  Discussed with hospitalist Dr. Jerral Ralph.  States she has been waxing and waning over the weekend.  ROS: Unable to obtain due to poor mental status  Examination  Vital signs in last 24 hours: Temp:  [97.5 F (36.4 C)-99 F (37.2 C)] 99 F (37.2 C) (06/10 0758) Pulse Rate:  [85-88] 88 (06/10 0758) Resp:  [17-18] 17 (06/10 0758) BP: (107-146)/(46-67) 122/55 (06/10 0758) Weight:  [48.8 kg] 48.8 kg (06/10 0500)  General: lying in bed, NAD Neuro: Did open her eyes to repeated tactile and verbal stimulation, very look at examiner, did not answer any orientation questions, did not follow commands, kept trying to fall back asleep  Basic Metabolic Panel: Recent Labs  Lab 02/15/23 0540 02/16/23 0629 02/17/23 0651 02/18/23 0539 02/19/23 0122 02/20/23 0529  NA 137 138 139 138 137 137  K 3.7 3.5 3.7 3.6 3.9 3.8  CL 106 107 106 105 105 104  CO2 21* 19* 23 21* 21* 24  GLUCOSE 108* 140* 148* 110* 99 121*  BUN 13 20 27* 17 23 22   CREATININE 0.62 0.65 0.69 0.61 0.72 0.70  CALCIUM 8.4* 8.4* 8.3* 8.4* 8.3* 9.0  MG 2.2 2.2 2.2 2.1  --   --   PHOS 3.2 3.4 4.0 3.0  --   --     CBC: Recent Labs  Lab 02/14/23 1743 02/15/23 0540 02/16/23 0841 02/17/23 0651 02/18/23 0539  WBC 10.5 9.8 9.7 8.7 11.3*  HGB 12.3 12.6 11.9* 11.2* 12.3  HCT 35.9* 37.4 35.4* 33.6* 37.5  MCV 88.2 88.6 90.8 91.8 90.6  PLT 203 203 212 223 266     Coagulation Studies: No results for input(s): "LABPROT", "INR" in the last 72 hours.  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     Focal electrographic status epilepticus, resolved ICH, POA Acute  encephalopathy, improving - Seizures most likely secondary to underlying ICH - Status epilepticus has resolved.  EEG was also improved.  However patient continues to be encephalopathic.  This could be secondary to medications, delirium   Recommendations - Continue Depakote 500mg  BID and Vimpat 200 mg twice daily -Due to waxing and waning mental status, will repeat 24-hour video EEG. - Will also check Depakote levels and ammonia -Continue seizure precautions, delirium precautions -As needed IV Versed for clinical seizures lasting more than 2 minutes -Discussed plan with Dr. Jerral Ralph and family   Addendum - Hyperammonemia: will reduce VPA to 250mg  qam and 500mg  qhs, lactulose 10mg  BID for 2 days.  I have spent a total of  36  minutes with the patient reviewing hospital notes,  test results, labs and examining the patient as well as establishing an assessment and plan.  > 50% of time was spent in direct patient care.   Lindie Spruce Epilepsy [facility] For questions after 5pm please refer to AMION to reach the Neurologist on call

## 2023-02-21 NOTE — Progress Notes (Signed)
LTM EEG running - no initial skin breakdown - push button tested - [facility] notified. 

## 2023-02-21 NOTE — Progress Notes (Signed)
PROGRESS NOTE        PATIENT DETAILS Name: Selena Branch Age: 82 y.o. Sex: female Date of Birth: Jan 28, 1941 Admit Date: 02/04/2023 Admitting Physician Rodolph Bong, MD EXB:[id number], Toniann Fail, MD  Brief Summary: Patient is a 82 y.o.  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  Unfortunately-further hospital course complicated by delirium, rectus muscle hematoma with acute blood loss anemia.    Significant studies: 5/24>> CT head: No acute abnormality. 5/24>> CT angio head/neck: No LVO/significant stenosis 5/24>> MRI brain: No acute intracranial process. 6/02>> CT abdomen/pelvis: Pelvic hematoma, intramuscular hemorrhage of right lower rectus muscle, hemoperitoneum suspected.  Significant microbiology data: None  Procedures: 5/24-5/25>> LTM EEG: Status epilepticus-left hemisphere-left parietal region 6/02>> Spot EEG: No definite seizures. 6/02-6/03>> LTM EEG: No seizures  Consults: Neurology  Subjective: No major agitation issues overnight-somewhat pleasantly confused but easily redirectable.  Follows simple commands.  Spoke with night RN earlier this morning-patient slept through most the night.  Objective: Vitals: Blood pressure (!) 122/55, pulse 88, temperature 99 F (37.2 C), temperature source Oral, resp. rate 17, weight 48.8 kg, SpO2 91 %.   Exam: Gen Exam:Alert awake-not in any distress HEENT:atraumatic, normocephalic Chest: B/L clear to auscultation anteriorly CVS:S1S2 regular Abdomen:soft non tender, non distended Extremities:no edema Neurology: Non focal Skin: no rash  Pertinent Labs/Radiology:    Latest Ref Rng & Units 02/18/2023    5:39 AM 02/17/2023    6:51 AM 02/16/2023    8:41 AM  CBC  WBC 4.0 - 10.5 K/uL 11.3  8.7  9.7   Hemoglobin 12.0 - 15.0 g/dL 40.1  02.7  25.3   Hematocrit 36.0 - 46.0 % 37.5  33.6  35.4    Platelets 150 - 400 K/uL 266  223  212     Lab Results  Component Value Date   NA 137 02/20/2023   K 3.8 02/20/2023   CL 104 02/20/2023   CO2 24 02/20/2023      Assessment/Plan: Acute metabolic encephalopathy/expressive aphasia secondary to status epilepticus Status epilepticus has resolved-unfortunately developed severe delirium-likely due to hospital delirium/medications Keppra switched to Depakote due to delirium, remains on Vimpat Appreciate neurology input.  Delirium Likely due to acute illness/hospital delirium Continues to have fluctuating levels of delirium Uneventful last night-continue Seroquel 50 mg twice daily/melatonin Continue to maintain delirium precautions.  Rectus muscle hematoma/pelvic hematoma/possible hemoperitoneum with acute blood loss anemia Resolved-benign abdominal exam  Hb now stable after multiple units of PRBC transfusion Follow CBC  History of ICH Stable-supportive care-nonfocal exam  History of CLL In remission-WBC remains stable  Hx of ITP Continue Promacta Platelet count stable Appreciate hematology input.  HTN BP stable Coreg/hydralazine  HLD Statin  Acute urine retention UTI Foley removed 6/7-voiding spontaneously. Completed Rocephin x 3 days for presumed UTI.  Mood disorder Zoloft  Nutrition Status: Nutrition Problem: Moderate Malnutrition Etiology: chronic illness Signs/Symptoms: mild fat depletion, moderate muscle depletion Interventions: Tube feeding  BMI: Estimated body mass index is 22.49 kg/m as calculated from the following:   Height as of 05/29/21: 4\' 10"  (1.473 m).   Weight as of this encounter: 48.8 kg.   Code status:   Code Status: DNR   DVT Prophylaxis: Place and maintain sequential compression device Start: 02/14/23 0709 SCDs Start: 02/04/23 1729.  Avoiding pharmacological agents  given rectus sheath hematoma/ABLA.   Family Communication: None at bedside-discussed with numerous family members  yesterday.  Disposition Plan: Status is: Inpatient Remains inpatient appropriate because: Severity of illness   Planned Discharge Destination:Skilled nursing facility   Diet: Diet Order             Diet regular Room service appropriate? No; Fluid consistency: Thin  Diet effective now                     Antimicrobial agents: Anti-infectives (From admission, onward)    Start     Dose/Rate Route Frequency Ordered Stop   02/08/23 0600  cefTRIAXone (ROCEPHIN) 1 g in sodium chloride 0.9 % 100 mL IVPB        1 g 200 mL/hr over 30 Minutes Intravenous Every 24 hours 02/07/23 0600 02/09/23 0547   02/07/23 0700  cefTRIAXone (ROCEPHIN) 1 g in sodium chloride 0.9 % 100 mL IVPB        1 g 200 mL/hr over 30 Minutes Intravenous  Once 02/07/23 0601 02/07/23 0645        MEDICATIONS: Scheduled Meds:  amLODipine  10 mg Oral QHS   atorvastatin  40 mg Per Tube Daily   carvedilol  3.125 mg Oral BID WC   chlorhexidine gluconate (MEDLINE KIT)  15 mL Mouth Rinse BID   Chlorhexidine Gluconate Cloth  6 each Topical Daily   dorzolamide-timolol  1 drop Both Eyes Daily   eltrombopag  25 mg Oral Q M,W,F   hydrALAZINE  25 mg Oral Q8H   latanoprost  1 drop Both Eyes QHS   melatonin  5 mg Oral QHS   multivitamin with minerals  1 tablet Per Tube Daily   pantoprazole (PROTONIX) IV  40 mg Intravenous Q12H   QUEtiapine  50 mg Oral BID   sertraline  25 mg Per Tube Daily   silver sulfADIAZINE   Topical BID   sodium chloride flush  3 mL Intravenous Q12H   tamsulosin  0.4 mg Oral Daily   Continuous Infusions:  lacosamide (VIMPAT) IV 200 mg (02/21/23 1047)   valproate sodium 500 mg (02/21/23 0902)   PRN Meds:.acetaminophen **OR** acetaminophen, haloperidol lactate **OR** haloperidol lactate, hydrALAZINE, midazolam, ondansetron **OR** ondansetron (ZOFRAN) IV, polyethylene glycol, sorbitol   I have personally reviewed following labs and imaging studies  LABORATORY DATA: CBC: Recent Labs  Lab  02/14/23 1743 02/15/23 0540 02/16/23 0841 02/17/23 0651 02/18/23 0539  WBC 10.5 9.8 9.7 8.7 11.3*  HGB 12.3 12.6 11.9* 11.2* 12.3  HCT 35.9* 37.4 35.4* 33.6* 37.5  MCV 88.2 88.6 90.8 91.8 90.6  PLT 203 203 212 223 266     Basic Metabolic Panel: Recent Labs  Lab 02/15/23 0540 02/16/23 0629 02/17/23 0651 02/18/23 0539 02/19/23 0122 02/20/23 0529  NA 137 138 139 138 137 137  K 3.7 3.5 3.7 3.6 3.9 3.8  CL 106 107 106 105 105 104  CO2 21* 19* 23 21* 21* 24  GLUCOSE 108* 140* 148* 110* 99 121*  BUN 13 20 27* 17 23 22   CREATININE 0.62 0.65 0.69 0.61 0.72 0.70  CALCIUM 8.4* 8.4* 8.3* 8.4* 8.3* 9.0  MG 2.2 2.2 2.2 2.1  --   --   PHOS 3.2 3.4 4.0 3.0  --   --      GFR: CrCl cannot be calculated (Unknown ideal weight.).  Liver Function Tests: No results for input(s): "AST", "ALT", "ALKPHOS", "BILITOT", "PROT", "ALBUMIN" in the last 168 hours.  No results for input(s): "  LIPASE", "AMYLASE" in the last 168 hours. No results for input(s): "AMMONIA" in the last 168 hours.   Coagulation Profile: No results for input(s): "INR", "PROTIME" in the last 168 hours.  Cardiac Enzymes: No results for input(s): "CKTOTAL", "CKMB", "CKMBINDEX", "TROPONINI" in the last 168 hours.  BNP (last 3 results) No results for input(s): "PROBNP" in the last 8760 hours.  Lipid Profile: No results for input(s): "CHOL", "HDL", "LDLCALC", "TRIG", "CHOLHDL", "LDLDIRECT" in the last 72 hours.  Thyroid Function Tests: No results for input(s): "TSH", "T4TOTAL", "FREET4", "T3FREE", "THYROIDAB" in the last 72 hours.  Anemia Panel: No results for input(s): "VITAMINB12", "FOLATE", "FERRITIN", "TIBC", "IRON", "RETICCTPCT" in the last 72 hours.  Urine analysis:    Component Value Date/Time   COLORURINE YELLOW 02/04/2023 1723   APPEARANCEUR HAZY (A) 02/04/2023 1723   LABSPEC 1.013 02/04/2023 1723   PHURINE 6.0 02/04/2023 1723   GLUCOSEU NEGATIVE 02/04/2023 1723   HGBUR LARGE (A) 02/04/2023 1723    BILIRUBINUR NEGATIVE 02/04/2023 1723   KETONESUR NEGATIVE 02/04/2023 1723   PROTEINUR NEGATIVE 02/04/2023 1723   UROBILINOGEN 0.2 05/06/2010 1611   NITRITE NEGATIVE 02/04/2023 1723   LEUKOCYTESUR TRACE (A) 02/04/2023 1723    Sepsis Labs: Lactic Acid, Venous    Component Value Date/Time   LATICACIDVEN 0.8 09/17/2019 0131    MICROBIOLOGY: No results found for this or any previous visit (from the past 240 hour(s)).  RADIOLOGY STUDIES/RESULTS: No results found.   LOS: 16 days   Jeoffrey Massed, MD  [facility]    To contact the attending provider between 7A-7P or the covering provider during after hours 7P-7A, please log into the web site [email or web address] and access using universal [place] password for that web site. If you do not have the password, please call the hospital operator.  02/21/2023, 11:25 AM

## 2023-02-21 NOTE — Consult Note (Signed)
Physical Medicine and Rehabilitation Consult Reason for Consult: Aphasia and impaired functional mobility Referring Physician: Jerral Ralph   HPI: Selena Branch is a 82 y.o. female with a history of ITP, ICH and CLL which is in remission who presented on 02/04/2023 with altered mental status and aphasia.  EEG was positive for seizures and she was found to have status epilepticus.  CT of the head and CT angio did not demonstrate any abnormalities or significant stenosis.  MRI was negative as well.  Course was complicated by delirium as well as a rectus muscle hematoma with acute blood loss anemia.  Seizures seem to be emanating from the left parietal region.  Most recent EEG did not demonstrate active seizures.  Patient was last up with PT on Friday and was mod assist for 25 feet using a rolling walker and was min to mod assist for sit to stand transfers.  She was min to mod assist for basic ADLs.   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 No pertinent surgical history. Family History  Problem Relation Age of Onset   Stroke Mother    Lung cancer Father    Leukemia Neg Hx    Breast cancer Neg Hx    Ovarian cancer Neg Hx    Colon cancer Neg Hx    Endometrial cancer Neg Hx    Prostate cancer Neg Hx    Pancreatic cancer Neg Hx    Social History:  reports that she has never smoked. She has never used smokeless tobacco. She reports that she does not drink alcohol and does not use drugs. Allergies:  Allergies  Allergen Reactions   Codeine Nausea Only   Evista [Raloxifene] Other (See Comments)    Leg cramps   Actonel [Risedronate] Other (See Comments)    GI intolerance   Boniva [Ibandronic Acid] Other (See Comments)    GI intolerance   Fosamax [Alendronate] Other (See Comments)    GI  intolerance   Prednisone Other (See Comments)    Aggression Irritability   Facility-Administered Medications Prior to Admission  Medication Dose Route Frequency Provider Last Rate Last Admin   0.9 %  sodium chloride infusion   Intravenous PRN Sigmund Hazel, MD       Medications Prior to Admission  Medication Sig Dispense Refill   amLODipine (NORVASC) 10 MG tablet Take 1 tablet (10 mg total) by mouth at bedtime. (Patient taking differently: Take 5 mg by mouth at bedtime.) 30 tablet 0   atorvastatin (LIPITOR) 40 MG tablet Take 40 mg by mouth daily.      Brinzolamide-Brimonidine (SIMBRINZA) 1-0.2 % SUSP Place 1 drop into both eyes 2 (two) times daily.     Calcium Carb-Cholecalciferol (CALCIUM + VITAMIN D3 PO) Take 1 tablet by mouth daily.     dorzolamide-timolol (COSOPT) 22.3-6.8 MG/ML ophthalmic solution Place 1 drop into both eyes 2 (two) times daily.     eltrombopag (PROMACTA) 25 MG tablet Take 1 tablet (25 mg) on Monday Wednesday and Friday .take on an empty stomach, 1 hour before a meal or 2 hours after. (Patient taking differently: Take 25 mg by mouth as directed. Take 1 tablet (25 mg) on Monday, Wednesday, Thursday and Saturday. Take on an empty stomach, 1  hour before a meal or 2 hours after.) 30 tablet 11   furosemide (LASIX) 20 MG tablet Take 20 mg by mouth daily.     ibandronate (BONIVA) 150 MG tablet Take 150 mg by mouth every 28 (twenty-eight) days.     lisinopril (ZESTRIL) 40 MG tablet Take 1 tablet (40 mg total) by mouth daily.     Multiple Vitamins-Minerals (WOMENS 50+ MULTI VITAMIN/MIN) TABS Take 1 tablet by mouth daily.     sertraline (ZOLOFT) 25 MG tablet Take 25 mg by mouth daily.     furosemide (LASIX) 20 MG tablet Take 40 mg for 3 days then resume your 20 mg daily (Patient not taking: Reported on 02/04/2023) 30 tablet    latanoprost (XALATAN) 0.005 % ophthalmic solution Place 1 drop into both eyes at bedtime.       Home: Home Living Family/patient expects to be discharged  to:: Skilled nursing facility Additional Comments: Pt poor historian today, no family present but lives with husband and was ambulatory and taking care of him prior to admission  Functional History: Prior Function Prior Level of Function : Patient poor historian/Family not available Mobility Comments: per chart review, pt was ambulatory prior to admission. Pt reports having a SPC and RW and utilizing but questionable ADLs Comments: Per chart pt was from home and was taking care of her husband Functional Status:  Mobility: Bed Mobility Overal bed mobility: Needs Assistance Bed Mobility: Supine to Sit Rolling: Max assist Sidelying to sit: Total assist Supine to sit: Min guard, HOB elevated Sit to supine: Min guard, HOB elevated General bed mobility comments: min guard for safety Transfers Overall transfer level: Needs assistance Equipment used: Rolling walker (2 wheels), 1 person hand held assist Transfers: Sit to/from Stand, Bed to chair/wheelchair/BSC Sit to Stand: Min assist, Mod assist Bed to/from chair/wheelchair/BSC transfer type:: Stand pivot Stand pivot transfers: Min assist, Mod assist General transfer comment: min A to steady on stand, increased to mod A toward end of session as pt fatigue increasing, standing x7 throughout session with HHA and to RW Ambulation/Gait Ambulation/Gait assistance: Mod assist Gait Distance (Feet): 25 Feet (+ 30 + 75 +10) Assistive device: Rolling walker (2 wheels), 1 person hand held assist Gait Pattern/deviations: Step-through pattern, Decreased stride length, Shuffle, Trunk flexed General Gait Details: mod A to manage RW and facilitate forward momentum and manage RW as pt drifting L, mod A with HHA on L to steady,  grossly min A for balance with pt shuffling feet needing cues for increased clearance and increased gait speed Gait velocity: decr Pre-gait activities: standing marching x30 seconds    ADL: ADL Overall ADL's : Needs  assistance/impaired Eating/Feeding: Set up, Bed level Eating/Feeding Details (indicate cue type and reason): minimal food spillage noted secondary to BUE tremors Grooming: Minimal assistance, Moderate assistance, Sitting, Brushing hair (using shampoo cap) Grooming Details (indicate cue type and reason): standing sinkside, max verbal/visual cues for locating grooming items Upper Body Dressing : Minimal assistance, Sitting Upper Body Dressing Details (indicate cue type and reason): mod A for threading BUEs, limited by cognition Lower Body Dressing: Moderate assistance, Sitting/lateral leans Lower Body Dressing Details (indicate cue type and reason): to donn socks Toilet Transfer: Moderate assistance, +2 for physical assistance, +2 for safety/equipment, Regular Toilet, Rolling walker (2 wheels) Toilet Transfer Details (indicate cue type and reason): simulated, max verbal cues for sequencing transfer Functional mobility during ADLs: Moderate assistance, +2 for physical assistance, +2 for safety/equipment, Cueing for sequencing, Cueing for safety, Rolling walker (2  wheels) General ADL Comments: limited by BUE tremors generalized weakness, impaired attention, safety awareness, sequencing and memory  Cognition: Cognition Overall Cognitive Status: Impaired/Different from baseline Arousal/Alertness: Awake/alert Orientation Level: Oriented to person Attention: Focused Focused Attention: Impaired Focused Attention Impairment: Verbal basic, Functional basic Cognition Arousal/Alertness: Awake/alert Behavior During Therapy: Restless, Flat affect Overall Cognitive Status: Impaired/Different from baseline Area of Impairment: Orientation, Attention, Memory, Following commands, Safety/judgement, Awareness, Problem solving Orientation Level: Disoriented to, Place, Time, Situation Current Attention Level: Focused Memory: Decreased recall of precautions, Decreased short-term memory Following Commands:  Follows one step commands inconsistently, Follows one step commands with increased time Safety/Judgement: Decreased awareness of safety, Decreased awareness of deficits Awareness: Intellectual Problem Solving: Slow processing, Difficulty sequencing, Requires verbal cues, Requires tactile cues General Comments: pt with flat affect on arrival, becoming more engaged once up OOB and outside  Blood pressure (!) 122/55, pulse 88, temperature 99 F (37.2 C), temperature source Oral, resp. rate 17, weight 48.8 kg, SpO2 91 %. Physical Exam Constitutional:      General: She is not in acute distress.    Comments: Pt curled up into ball in bed with mitten restraints, confused  HENT:     Head: Normocephalic.     Nose: Nose normal.  Eyes:     Conjunctiva/sclera: Conjunctivae normal.  Cardiovascular:     Rate and Rhythm: Normal rate.  Pulmonary:     Effort: Pulmonary effort is normal.  Abdominal:     General: Abdomen is flat.     Tenderness: There is abdominal tenderness.  Musculoskeletal:        General: No swelling.     Cervical back: Normal range of motion.  Skin:    Comments: Scattered bruising and abrasions  Neurological:     Comments: Pt is confused and perseverating on a number. Makes eye contacts. Phonates well, speech quality is good, speech content is half-hazard and confused in nature. She could not tell me where she was or even her name. She would not follow basic commands.  Moves all 4's but not to command. Seems to sense pain     No results found for this or any previous visit (from the past 24 hour(s)). No results found.  Assessment/Plan: Diagnosis: Debility and altered mental status after status epilepticus with prolonged hospital course and delirium Does the need for close, 24 hr/day medical supervision in concert with the patient's rehab needs make it unreasonable for this patient to be served in a less intensive setting? Yes potentially Co-Morbidities requiring  supervision/potential complications:  -Delirium -Rectus muscle hematoma with acute blood loss anemia -History of ITP and CLL -UTI/urinary retention Due to bladder management, bowel management, safety, skin/wound care, disease management, medication administration, pain management, and patient education, does the patient require 24 hr/day rehab nursing? Potentially Does the patient require coordinated care of a physician, rehab nurse, therapy disciplines of PT, OT, SLP to address physical and functional deficits in the context of the above medical diagnosis(es)? Yes and Potentially Addressing deficits in the following areas: balance, endurance, locomotion, strength, transferring, bowel/bladder control, bathing, dressing, feeding, grooming, toileting, cognition, and psychosocial support Can the patient actively participate in an intensive therapy program of at least 3 hrs of therapy per day at least 5 days per week? Yes and Potentially The potential for patient to make measurable gains while on inpatient rehab is good Anticipated functional outcomes upon discharge from inpatient rehab are supervision  with PT, supervision and min assist with OT, supervision and min assist with SLP.  Estimated rehab length of stay to reach the above functional goals is: potentially 10-15 days Anticipated discharge destination: Home Overall Rehab/Functional Prognosis: good  POST ACUTE RECOMMENDATIONS: This patient's condition is appropriate for continued rehabilitative care in the following setting:  potentially CIR Patient has agreed to participate in recommended program. N/A Note that insurance prior authorization may be required for reimbursement for recommended care.  Comment: I will need to see improvement in cognition and meaningful participation with therapies before fully recommending inpatient rehab. Rehab Admissions Coordinator to follow up     I have personally performed a face to face diagnostic  evaluation of this patient. Additionally, I have examined the patient's medical record including any pertinent labs and radiographic images. If the physician assistant has documented in this note, I have reviewed and edited or otherwise concur with the physician assistant's documentation.  Thanks,  Ranelle Oyster, MD 02/21/2023

## 2023-02-22 DIAGNOSIS — S301XXS Contusion of abdominal wall, sequela: Secondary | ICD-10-CM | POA: Diagnosis not present

## 2023-02-22 DIAGNOSIS — R569 Unspecified convulsions: Secondary | ICD-10-CM | POA: Diagnosis not present

## 2023-02-22 DIAGNOSIS — I10 Essential (primary) hypertension: Secondary | ICD-10-CM | POA: Diagnosis not present

## 2023-02-22 DIAGNOSIS — D696 Thrombocytopenia, unspecified: Secondary | ICD-10-CM | POA: Diagnosis not present

## 2023-02-22 LAB — BASIC METABOLIC PANEL
Anion gap: 8 (ref 5–15)
BUN: 31 mg/dL — ABNORMAL HIGH (ref 8–23)
CO2: 25 mmol/L (ref 22–32)
Calcium: 9 mg/dL (ref 8.9–10.3)
Chloride: 107 mmol/L (ref 98–111)
Creatinine, Ser: 0.98 mg/dL (ref 0.44–1.00)
GFR, Estimated: 57 mL/min — ABNORMAL LOW (ref >60)
Glucose, Bld: 109 mg/dL — ABNORMAL HIGH (ref 70–99)
Potassium: 4.2 mmol/L (ref 3.5–5.1)
Sodium: 140 mmol/L (ref 135–145)

## 2023-02-22 LAB — CBC
HCT: 36.7 % (ref 36.0–46.0)
Hemoglobin: 12 g/dL (ref 12.0–15.0)
MCH: 30.8 pg (ref 26.0–34.0)
MCHC: 32.7 g/dL (ref 30.0–36.0)
MCV: 94.1 fL (ref 80.0–100.0)
Platelets: 288 10*3/uL (ref 150–400)
RBC: 3.9 MIL/uL (ref 3.87–5.11)
RDW: 16.3 % — ABNORMAL HIGH (ref 11.5–15.5)
WBC: 9.1 10*3/uL (ref 4.0–10.5)
nRBC: 0 % (ref 0.0–0.2)

## 2023-02-22 LAB — AMMONIA: Ammonia: 41 umol/L — ABNORMAL HIGH (ref 9–35)

## 2023-02-22 MED ORDER — QUETIAPINE FUMARATE 50 MG PO TABS
50.0000 mg | ORAL_TABLET | Freq: Every day | ORAL | Status: DC
  Administered 2023-02-22: 50 mg via ORAL
  Filled 2023-02-22: qty 1

## 2023-02-22 MED ORDER — QUETIAPINE FUMARATE 25 MG PO TABS: 25.0000 mg | ORAL_TABLET | Freq: Every morning | ORAL | Status: DC

## 2023-02-22 NOTE — Progress Notes (Signed)
Physical Therapy Treatment Patient Details Name: Selena Branch MRN: [id number] DOB: 01/29/1941 Today's Date: 02/22/2023   History of Present Illness Selena Branch is a 82 y.o. female admitted to [facility] on 5/24 with sudden onset altered mental status/aphasia. NWG:[id number]; EEG: positive for seizures. PMHx: CLL currently in remission, glaucoma, hypertension, thrombocytopenia, hyperlipidemia MVH:[id number]; EEG: positive for seizures.    PT Comments    Pt tolerated today's session well, remaining limited by cognition but able to intermittently make needs known and attempts to converse throughout session. Pt tolerated sitting EOB and transfers to/from [facility] well, requiring increased time and modA to transfer for safety, balance, and support, with increased cueing for sequencing and technique. Attempted ambulation but pt with minimal foot clearance with attempts at marching in place and side stepping, pt returning to sitting when going to attempt to ambulate forwards. Pt's goals updated and extended today, slowly making progress with mobility. Discharge recommendations remain appropriate, acute PT will continue to follow during admission.     Recommendations for follow up therapy are one component of a multi-disciplinary discharge planning process, led by the attending physician.  Recommendations may be updated based on patient status, additional functional criteria and insurance authorization.  Follow Up Recommendations  Can patient physically be transported by private vehicle: No    Assistance Recommended at Discharge Frequent or constant Supervision/Assistance  Patient can return home with the following A lot of help with walking and/or transfers;A lot of help with bathing/dressing/bathroom;Direct supervision/assist for medications management;Direct supervision/assist for financial management;Assistance with cooking/housework;Assist for transportation;Help with stairs or ramp for entrance   Equipment  Recommendations  Other (comment) (defer to next level of care)    Recommendations for Other Services       Precautions / Restrictions Precautions Precautions: Fall Precaution Comments: seizure Restrictions Weight Bearing Restrictions: No     Mobility  Bed Mobility Overal bed mobility: Needs Assistance Bed Mobility: Supine to Sit, Sit to Supine, Rolling Rolling: Min assist   Supine to sit: HOB elevated, Min assist Sit to supine: HOB elevated, Min assist   General bed mobility comments: minA for trunk support into sitting, assist for BLE management back into the bed, increased time required. Pt able to roll to the left to adjust pads with minA to initiate and use of bed rail    Transfers Overall transfer level: Needs assistance Equipment used: Rolling walker (2 wheels), 1 person hand held assist Transfers: Sit to/from Stand, Bed to chair/wheelchair/BSC Sit to Stand: Min assist, Mod assist Stand pivot transfers: Mod assist         General transfer comment: min-modA to power up to stand with increased cueing for upright posture, steady assist provided. Pt performed 2 stand pivot transfers to and from the [facility], successful urination with first trial, second trial without void. Pt with increased cueing and trunk flexion with pivoting transfer with increased assist for balance and support    Ambulation/Gait             Pre-gait activities: pt attempting marching in place but very limited foot clearance with verbal and visual cues. Able to side step along EOB minimally but fatiguing when attempting forward ambulation, returning to sitting     Stairs             Wheelchair Mobility    Modified Rankin (Stroke Patients Only)       Balance Overall balance assessment: Needs assistance Sitting-balance support: Single extremity supported, Feet supported Sitting balance-Leahy Scale: Fair Sitting balance - Comments:  sitting EOB   Standing balance support: Bilateral  upper extremity supported, Reliant on assistive device for balance Standing balance-Leahy Scale: Poor Standing balance comment: reliant on UE and external support                            Cognition Arousal/Alertness: Awake/alert Behavior During Therapy: Restless, Flat affect Overall Cognitive Status: Impaired/Different from baseline Area of Impairment: Orientation, Attention, Memory, Following commands, Safety/judgement, Awareness, Problem solving                 Orientation Level: Disoriented to, Place, Time, Situation Current Attention Level: Focused Memory: Decreased recall of precautions, Decreased short-term memory Following Commands: Follows one step commands inconsistently, Follows one step commands with increased time Safety/Judgement: Decreased awareness of safety, Decreased awareness of deficits Awareness: Intellectual Problem Solving: Slow processing, Difficulty sequencing, Requires verbal cues, Requires tactile cues General Comments: pt with flat affect throughout session, following commands with verbal and tactile cues inconsistently. Did ask to go to the bathroom during the session        Exercises      General Comments General comments (skin integrity, edema, etc.): VSS on room air      Pertinent Vitals/Pain Pain Assessment Pain Assessment: Faces Faces Pain Scale: No hurt    Home Living                          Prior Function            PT Goals (current goals can now be found in the care plan section) Acute Rehab PT Goals Patient Stated Goal: get up PT Goal Formulation: Patient unable to participate in goal setting Time For Goal Achievement: 03/08/23 Potential to Achieve Goals: Fair Progress towards PT goals: Progressing toward goals (goals updated today)    Frequency    Min 3X/week      PT Plan Current plan remains appropriate    Co-evaluation              AM-PAC PT "6 Clicks" Mobility   Outcome  Measure  Help needed turning from your back to your side while in a flat bed without using bedrails?: A Little Help needed moving from lying on your back to sitting on the side of a flat bed without using bedrails?: A Little Help needed moving to and from a bed to a chair (including a wheelchair)?: A Lot Help needed standing up from a chair using your arms (e.g., wheelchair or bedside chair)?: A Lot Help needed to walk in hospital room?: Total Help needed climbing 3-5 steps with a railing? : Total 6 Click Score: 12    End of Session Equipment Utilized During Treatment: Gait belt Activity Tolerance: Patient tolerated treatment well Patient left: with call bell/phone within reach;in bed;with bed alarm set Nurse Communication: Mobility status PT Visit Diagnosis: Muscle weakness (generalized) (M62.81);Other abnormalities of gait and mobility (R26.89);Unsteadiness on feet (R26.81)     Time: 1610-9604 PT Time Calculation (min) (ACUTE ONLY): 30 min  Charges:  $Therapeutic Activity: 23-37 mins                     Lindalou Hose, PT DPT Acute Rehabilitation Services Office [phone]    Leonie Man 02/22/2023, 4:39 PM

## 2023-02-22 NOTE — Progress Notes (Signed)
Nutrition Follow-up  DOCUMENTATION CODES:  Non-severe (moderate) malnutrition in context of chronic illness  INTERVENTION:  Continue Multivitamin w/ minerals daily Continue Magic cup TID with meals, each supplement provides 290 kcal and 9 grams of protein Feeding assist with all meals Encourage good PO intake  Ok for family to bring outside food  NUTRITION DIAGNOSIS:  Moderate Malnutrition related to chronic illness as evidenced by mild fat depletion, moderate muscle depletion. - Ongoing  GOAL:  Patient will meet greater than or equal to 90% of their needs - Ongoing   MONITOR:  PO intake, Supplement acceptance, Labs, Weight trends, I & O's  REASON FOR ASSESSMENT:  Consult Enteral/tube feeding initiation and management  ASSESSMENT:  82 y.o. female presented to the ED with AMS/aphasia. PMH includes HTN, HLD, CLL, and glaucoma. Pt admitted with possible seizures.   5/24 - admitted 5/27 - diet advanced to full liquids 5/30 - diet advanced to regular 5/31 - Cortrak placed (tip gastric/proximal duodenal) 6/04 - Cortrak pulled 6/05 - Cortrak replaced  6/06 - Cortrak pulled  Pt sitting up in bed, family at bedside. Pt appears to be more awake and alert this visit. Family reports that pt PO intake continues to vary. Reports that yesterday, pt ate all of her lunch and half of a smoothie they brought for her. Pt did not drink any Ensure PTA as she did not like them. Pt has ate a magic cup that they know of, but prefers vanilla and does not like anything that is chocolate. RD provided daughter with a menu, daughter provided food preferences and RD put into meal ordering system. RD encouraged family to call and order meals for pt. RD also encouraged family to continue to bring outside food as pt request or that they know she likes to eat.  Family thinks that pt tends to eat better for nursing staff then she does for them. Discussed with RN to make sure that pt is getting feeding assist if  family is not in room.   Meal Intake 6/03 - 0% x 2 meals 6/4-6/8: 0-75% x 8 meals (average 31%)  Medications reviewed and include: Lactulose, Melatonin, Protonix, MVI Labs reviewed: Sodium 140, Potassium 4.2, BUN 31, Creatinine 0.98  Diet Order:   Diet Order             Diet regular Room service appropriate? No; Fluid consistency: Thin  Diet effective now                  EDUCATION NEEDS: Not appropriate for education at this time  Skin:  Skin Assessment: Reviewed RN Assessment  Last BM:  6/6  Height:  Ht Readings from Last 1 Encounters:  05/29/21 4\' 10"  (1.473 m)   Weight:  Wt Readings from Last 1 Encounters:  02/22/23 49.6 kg   BMI:  Body mass index is 22.85 kg/m.  Estimated Nutritional Needs:  Kcal:  1400-1600 Protein:  70-90 grams Fluid:  >/= 1.5 L   Kirby Crigler RD, LDN Clinical Dietitian See [facility] for contact information.

## 2023-02-22 NOTE — Progress Notes (Signed)
PROGRESS NOTE        PATIENT DETAILS Name: SADAKO CEGIELSKI Age: 82 y.o. Sex: female Date of Birth: [birth year] Admit Date: 02/04/2023 Admitting Physician Rodolph Bong, MD ZOX:[id number], Toniann Fail, MD  Brief Summary: Patient is a 82 y.o.  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  Unfortunately-further hospital course complicated by delirium, rectus muscle hematoma with acute blood loss anemia.    Significant studies: 5/24>> CT head: No acute abnormality. 5/24>> CT angio head/neck: No LVO/significant stenosis 5/24>> MRI brain: No acute intracranial process. 6/02>> CT abdomen/pelvis: Pelvic hematoma, intramuscular hemorrhage of right lower rectus muscle, hemoperitoneum suspected. 6/10-6/11>> LTM EEG: No seizures.  Significant microbiology data: None  Procedures: 5/24-5/25>> LTM EEG: Status epilepticus-left hemisphere-left parietal region 6/02>> Spot EEG: No definite seizures. 6/02-6/03>> LTM EEG: No seizures  Consults: Neurology  Subjective: Pleasantly confused-somewhat redirectable-spoke with night RN earlier this morning-she slept through most of the night.  Objective: Vitals: Blood pressure (!) 123/52, pulse 78, temperature 97.9 F (36.6 C), temperature source Oral, resp. rate 16, weight 49.6 kg, SpO2 98 %.   Exam: Gen Exam:not in any distress HEENT:atraumatic, normocephalic Chest: B/L clear to auscultation anteriorly CVS:S1S2 regular Abdomen:soft non tender, non distended Extremities:no edema Neurology: Non focal Skin: no rash  Pertinent Labs/Radiology:    Latest Ref Rng & Units 02/22/2023    3:34 AM 02/18/2023    5:39 AM 02/17/2023    6:51 AM  CBC  WBC 4.0 - 10.5 K/uL 9.1  11.3  8.7   Hemoglobin 12.0 - 15.0 g/dL 09.8  11.9  14.7   Hematocrit 36.0 - 46.0 % 36.7  37.5  33.6   Platelets 150 - 400 K/uL 288  266  223     Lab Results   Component Value Date   NA 140 02/22/2023   K 4.2 02/22/2023   CL 107 02/22/2023   CO2 25 02/22/2023      Assessment/Plan: Acute metabolic encephalopathy/expressive aphasia secondary to status epilepticus Status epilepticus resolved-repeat LTM EEG overnight without any seizures Remains on Vimpat-dosage of Depakote reduced slightly by neurology.    Delirium Likely due to acute illness/hospital delirium She probably has very poor brain reserve/some amount of chronic cognitive issues following ICH Continues to have fluctuating levels of delirium but overall much better than last week. Some concern that she may be a bit drowsy this morning by neurology-will reduced a.m. dose of Seroquel to 25 mg. Continue 50 mg of Seroquel nightly/along with melatonin. Continue to maintain delirium precautions.  Rectus muscle hematoma/pelvic hematoma/possible hemoperitoneum with acute blood loss anemia Resolved-benign abdominal exam  Hb now stable after multiple units of PRBC transfusion Follow CBC  History of ICH Stable-supportive care-nonfocal exam  History of CLL In remission-WBC remains stable  Hx of ITP Continue Promacta Platelet count stable Appreciate hematology input.  HTN BP stable Coreg/hydralazine  HLD Statin  Acute urine retention UTI Foley removed 6/7-voiding spontaneously. Completed Rocephin x 3 days for presumed UTI.  Mood disorder Zoloft  Nutrition Status: Nutrition Problem: Moderate Malnutrition Etiology: chronic illness Signs/Symptoms: mild fat depletion, moderate muscle depletion Interventions: Magic cup  BMI: Estimated body mass index is 22.85 kg/m as calculated from the following:   Height as of 05/29/21: 4\' 10"  (1.473 m).   Weight as of this encounter: 49.6 kg.  Code status:   Code Status: DNR   DVT Prophylaxis: Place and maintain sequential compression device Start: 02/14/23 0709 SCDs Start: 02/04/23 1729.  Avoiding pharmacological agents given  rectus sheath hematoma/ABLA.   Family Communication: Daughter and spouse at bedside this morning.  Disposition Plan: Status is: Inpatient Remains inpatient appropriate because: Severity of illness   Planned Discharge Destination:Skilled nursing facility   Diet: Diet Order             Diet regular Room service appropriate? No; Fluid consistency: Thin  Diet effective now                     Antimicrobial agents: Anti-infectives (From admission, onward)    Start     Dose/Rate Route Frequency Ordered Stop   02/08/23 0600  cefTRIAXone (ROCEPHIN) 1 g in sodium chloride 0.9 % 100 mL IVPB        1 g 200 mL/hr over 30 Minutes Intravenous Every 24 hours 02/07/23 0600 02/09/23 0547   02/07/23 0700  cefTRIAXone (ROCEPHIN) 1 g in sodium chloride 0.9 % 100 mL IVPB        1 g 200 mL/hr over 30 Minutes Intravenous  Once 02/07/23 0601 02/07/23 0645        MEDICATIONS: Scheduled Meds:  amLODipine  10 mg Oral QHS   atorvastatin  40 mg Per Tube Daily   carvedilol  3.125 mg Oral BID WC   chlorhexidine gluconate (MEDLINE KIT)  15 mL Mouth Rinse BID   Chlorhexidine Gluconate Cloth  6 each Topical Daily   divalproex  250 mg Oral q morning   And   divalproex  500 mg Oral QHS   dorzolamide-timolol  1 drop Both Eyes Daily   eltrombopag  25 mg Oral Q M,W,F   hydrALAZINE  25 mg Oral Q8H   lactulose  10 g Oral BID   latanoprost  1 drop Both Eyes QHS   melatonin  5 mg Oral QHS   multivitamin with minerals  1 tablet Per Tube Daily   pantoprazole (PROTONIX) IV  40 mg Intravenous Q12H   QUEtiapine  50 mg Oral BID   sertraline  25 mg Per Tube Daily   silver sulfADIAZINE   Topical BID   sodium chloride flush  3 mL Intravenous Q12H   Continuous Infusions:  lacosamide (VIMPAT) IV 200 mg (02/22/23 1023)   PRN Meds:.acetaminophen **OR** acetaminophen, haloperidol lactate **OR** haloperidol lactate, hydrALAZINE, midazolam, ondansetron **OR** ondansetron (ZOFRAN) IV, polyethylene glycol,  sorbitol   I have personally reviewed following labs and imaging studies  LABORATORY DATA: CBC: Recent Labs  Lab 02/16/23 0841 02/17/23 0651 02/18/23 0539 02/22/23 0334  WBC 9.7 8.7 11.3* 9.1  HGB 11.9* 11.2* 12.3 12.0  HCT 35.4* 33.6* 37.5 36.7  MCV 90.8 91.8 90.6 94.1  PLT 212 223 266 288     Basic Metabolic Panel: Recent Labs  Lab 02/16/23 0629 02/17/23 0651 02/18/23 0539 02/19/23 0122 02/20/23 0529 02/22/23 0334  NA 138 139 138 137 137 140  K 3.5 3.7 3.6 3.9 3.8 4.2  CL 107 106 105 105 104 107  CO2 19* 23 21* 21* 24 25  GLUCOSE 140* 148* 110* 99 121* 109*  BUN 20 27* 17 23 22  31*  CREATININE 0.65 0.69 0.61 0.72 0.70 0.98  CALCIUM 8.4* 8.3* 8.4* 8.3* 9.0 9.0  MG 2.2 2.2 2.1  --   --   --   PHOS 3.4 4.0 3.0  --   --   --  GFR: CrCl cannot be calculated (Unknown ideal weight.).  Liver Function Tests: No results for input(s): "AST", "ALT", "ALKPHOS", "BILITOT", "PROT", "ALBUMIN" in the last 168 hours.  No results for input(s): "LIPASE", "AMYLASE" in the last 168 hours. Recent Labs  Lab 02/21/23 1301 02/22/23 0812  AMMONIA 65* 41*     Coagulation Profile: No results for input(s): "INR", "PROTIME" in the last 168 hours.  Cardiac Enzymes: No results for input(s): "CKTOTAL", "CKMB", "CKMBINDEX", "TROPONINI" in the last 168 hours.  BNP (last 3 results) No results for input(s): "PROBNP" in the last 8760 hours.  Lipid Profile: No results for input(s): "CHOL", "HDL", "LDLCALC", "TRIG", "CHOLHDL", "LDLDIRECT" in the last 72 hours.  Thyroid Function Tests: No results for input(s): "TSH", "T4TOTAL", "FREET4", "T3FREE", "THYROIDAB" in the last 72 hours.  Anemia Panel: No results for input(s): "VITAMINB12", "FOLATE", "FERRITIN", "TIBC", "IRON", "RETICCTPCT" in the last 72 hours.  Urine analysis:    Component Value Date/Time   COLORURINE YELLOW 02/04/2023 1723   APPEARANCEUR HAZY (A) 02/04/2023 1723   LABSPEC 1.013 02/04/2023 1723   PHURINE 6.0  02/04/2023 1723   GLUCOSEU NEGATIVE 02/04/2023 1723   HGBUR LARGE (A) 02/04/2023 1723   BILIRUBINUR NEGATIVE 02/04/2023 1723   KETONESUR NEGATIVE 02/04/2023 1723   PROTEINUR NEGATIVE 02/04/2023 1723   UROBILINOGEN 0.2 05/06/2010 1611   NITRITE NEGATIVE 02/04/2023 1723   LEUKOCYTESUR TRACE (A) 02/04/2023 1723    Sepsis Labs: Lactic Acid, Venous    Component Value Date/Time   LATICACIDVEN 0.8 09/17/2019 0131    MICROBIOLOGY: No results found for this or any previous visit (from the past 240 hour(s)).  RADIOLOGY STUDIES/RESULTS: Overnight EEG with video  Result Date: 02/22/2023 Charlsie Quest, MD     02/22/2023  8:34 AM Patient Name: DRIANNA STOLAR MRN: [id number] Epilepsy Attending: Charlsie Quest Referring Physician/Provider: Charlsie Quest, MD Duration: 02/21/2023 1428 to 02/22/2023 0830  Patient history: 82 y.o. female with a past medical history of CLL, ITP, left parietal ICH in 2021 presenting with aphasia and generalized weakness. EEG to evaluate for seizure.  Level of alertness: awake. asleep  AEDs during EEG study: LCM, VPA  Technical aspects: This EEG study was done with scalp electrodes positioned according to the 10-20 International system of electrode placement. Electrical activity was reviewed with band pass filter of 1-70Hz , sensitivity of 7 uV/mm, display speed of 37mm/sec with a 60Hz  notched filter applied as appropriate. EEG data were recorded continuously and digitally stored.  Video monitoring was available and reviewed as appropriate.  Description:  The posterior dominant rhythm consists of 8-9 Hz activity of moderate voltage (25-35 uV) seen predominantly in posterior head regions, symmetric and reactive to eye opening and eye closing.  Sleep was characterized by sleep spindles (12 to 14 Hz), maximal frontocentral region.  EEG showed continuous generalized and lateralized left hemisphere 5-9Hz  theta-alpha activity admixed with intermittent 2-3hz  delta slowing.  Hyperventilation and photic stimulation were not performed.   ABNORMALITY - Continuous slow, generalized and lateralized left hemisphere  IMPRESSION: This study is suggestive of cortical dysfunction arising from left hemisphere likely secondary to underlying structural abnormality. Additionally there is moderate diffuse encephalopathy. No definite seizures were seen.  Charlsie Quest    LOS: 17 days   Jeoffrey Massed, MD  [facility]    To contact the attending provider between 7A-7P or the covering provider during after hours 7P-7A, please log into the web site [email or web address] and access using universal [place] password for that web site. If you do  not have the password, please call the hospital operator.  02/22/2023, 11:09 AM

## 2023-02-22 NOTE — Progress Notes (Signed)
EEG LTM D/C'd. Patient had no known skin break down. [facility] notified.

## 2023-02-22 NOTE — Procedures (Addendum)
Patient Name: Selena Branch  MRN: [id number]  Epilepsy Attending: Charlsie Quest  Referring Physician/Provider: Charlsie Quest, MD  Duration: 02/21/2023 1428 to 02/22/2023 4098   Patient history: 82 y.o. female with a past medical history of CLL, ITP, left parietal ICH in 2021 presenting with aphasia and generalized weakness. EEG to evaluate for seizure.   Level of alertness: awake. asleep   AEDs during EEG study: LCM, VPA   Technical aspects: This EEG study was done with scalp electrodes positioned according to the 10-20 International system of electrode placement. Electrical activity was reviewed with band pass filter of 1-70Hz , sensitivity of 7 uV/mm, display speed of 41mm/sec with a 60Hz  notched filter applied as appropriate. EEG data were recorded continuously and digitally stored.  Video monitoring was available and reviewed as appropriate.   Description:  The posterior dominant rhythm consists of 8-9 Hz activity of moderate voltage (25-35 uV) seen predominantly in posterior head regions, symmetric and reactive to eye opening and eye closing.  Sleep was characterized by sleep spindles (12 to 14 Hz), maximal frontocentral region.  EEG showed continuous generalized and lateralized left hemisphere 5-9Hz  theta-alpha activity admixed with intermittent 2-3hz  delta slowing. Hyperventilation and photic stimulation were not performed.     ABNORMALITY - Continuous slow, generalized and lateralized left hemisphere   IMPRESSION: This study is suggestive of cortical dysfunction arising from left hemisphere likely secondary to underlying structural abnormality. Additionally there is moderate diffuse encephalopathy. No definite seizures were seen.   Erina Hamme [name]

## 2023-02-22 NOTE — Progress Notes (Signed)
SLP Cancellation Note  Patient Details Name: Selena Branch MRN: [id number] DOB: [birth year]   Cancelled treatment:       Reason Eval/Treat Not Completed: Patient's level of consciousness. SLP will continue to follow  Angela Nevin, MA, CCC-SLP Speech Therapy

## 2023-02-22 NOTE — Progress Notes (Addendum)
Inpatient Rehab Admissions Coordinator:   CIR following at a distance. Rehab MD consulted and felt pt.'s mentation and participation with therapy need to be better before we can consider for CIR admit. Daughter updated. If Pt. Is ready for d/c before her mentation and participation improve, may need SNF instead of CIR  Megan Salon, MS, CCC-SLP Rehab Admissions Coordinator  [phone] (celll) [phone] (office)

## 2023-02-22 NOTE — Progress Notes (Signed)
Subjective: NAEO. Was drowsy when I saw her but per Dr Jerral Ralph, did sleep well last night and ws awake when he saw her.   ROS: Unable to obtain due to poor mental status  Examination  Vital signs in last 24 hours: Temp:  [97.9 F (36.6 C)-98.9 F (37.2 C)] 98 F (36.7 C) (06/11 1146) Pulse Rate:  [78-79] 78 (06/11 0727) Resp:  [16-18] 17 (06/11 1146) BP: (102-124)/(46-55) 102/55 (06/11 1146) SpO2:  [98 %] 98 % (06/11 0800) Weight:  [49.6 kg] 49.6 kg (06/11 0500)  General: lying in bed, NAD Neuro: Did open her eyes to verbal stimulation, did look at examiner, did not answer any orientation questions, did not follow commands, kept trying to fall back asleep, CN appear intact, spontaneously moving all extremities  Basic Metabolic Panel: Recent Labs  Lab 02/16/23 0629 02/17/23 0651 02/18/23 0539 02/19/23 0122 02/20/23 0529 02/22/23 0334  NA 138 139 138 137 137 140  K 3.5 3.7 3.6 3.9 3.8 4.2  CL 107 106 105 105 104 107  CO2 19* 23 21* 21* 24 25  GLUCOSE 140* 148* 110* 99 121* 109*  BUN 20 27* 17 23 22  31*  CREATININE 0.65 0.69 0.61 0.72 0.70 0.98  CALCIUM 8.4* 8.3* 8.4* 8.3* 9.0 9.0  MG 2.2 2.2 2.1  --   --   --   PHOS 3.4 4.0 3.0  --   --   --     CBC: Recent Labs  Lab 02/16/23 0841 02/17/23 0651 02/18/23 0539 02/22/23 0334  WBC 9.7 8.7 11.3* 9.1  HGB 11.9* 11.2* 12.3 12.0  HCT 35.4* 33.6* 37.5 36.7  MCV 90.8 91.8 90.6 94.1  PLT 212 223 266 288     Coagulation Studies: No results for input(s): "LABPROT", "INR" in the last 72 hours.  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     Focal electrographic status epilepticus, resolved ICH, POA Acute encephalopathy,  improving Hyperammonemia - Seizures most likely secondary to underlying ICH - Status epilepticus has resolved.  EEG was also improved.  However patient continues to be encephalopathic.  This could be secondary to medications, delirium   Recommendations - Continue Depakote VPA to 250mg  qam and 500mg  qhs - DC LTM eeg as no seizures overnight - On lactulose for hyperammonemia - If remains excessively drowsy, can reduce seroquel - Continue seizure precautions, delirium precautions - As needed IV Versed for clinical seizures lasting more than 2 minutes - Discussed plan with Dr. Jerral Ralph via secure chart  I have spent a total of  26  minutes with the patient reviewing hospital notes,  test results, labs and examining the patient as well as establishing an assessment and plan.  > 50% of time was spent in direct patient care.  Lindie Spruce Epilepsy [facility] For questions after 5pm please refer to AMION to reach the Neurologist on call

## 2023-02-23 DIAGNOSIS — I10 Essential (primary) hypertension: Secondary | ICD-10-CM | POA: Diagnosis not present

## 2023-02-23 DIAGNOSIS — R569 Unspecified convulsions: Secondary | ICD-10-CM | POA: Diagnosis not present

## 2023-02-23 DIAGNOSIS — D696 Thrombocytopenia, unspecified: Secondary | ICD-10-CM | POA: Diagnosis not present

## 2023-02-23 DIAGNOSIS — S301XXS Contusion of abdominal wall, sequela: Secondary | ICD-10-CM | POA: Diagnosis not present

## 2023-02-23 MED ORDER — LACOSAMIDE 50 MG PO TABS
200.0000 mg | ORAL_TABLET | Freq: Two times a day (BID) | ORAL | Status: DC
  Administered 2023-02-23 – 2023-02-27 (×9): 200 mg via ORAL
  Filled 2023-02-23 (×10): qty 4

## 2023-02-23 MED ORDER — QUETIAPINE FUMARATE 25 MG PO TABS
25.0000 mg | ORAL_TABLET | Freq: Every day | ORAL | Status: DC
  Administered 2023-02-23 – 2023-02-27 (×5): 25 mg via ORAL
  Filled 2023-02-23 (×6): qty 1

## 2023-02-23 MED ORDER — PANTOPRAZOLE SODIUM 40 MG PO TBEC
40.0000 mg | DELAYED_RELEASE_TABLET | Freq: Two times a day (BID) | ORAL | Status: DC
  Administered 2023-02-23 – 2023-02-28 (×10): 40 mg via ORAL
  Filled 2023-02-23 (×10): qty 1

## 2023-02-23 NOTE — Progress Notes (Signed)
Speech Language Pathology Treatment: Dysphagia  Patient Details Name: Selena Branch MRN: [id number] DOB: 01-07-1941 Today's Date: 02/23/2023 Time: 4098-1191 SLP Time Calculation (min) (ACUTE ONLY): 15 min  Assessment / Plan / Recommendation Clinical Impression  Patient seen by SLP for skilled treatment focused on dysphagia goals. Patient was awake, alert and pleasant. RN was in room to give her one of her medications. Per RN, patient has not been eating a lot but they have been able to feed her some today. Patient swallow medication whole in applesauce without difficulty. SLP handed her a cup of water and she was able to then self-manage, taking sips from the straw. No overt s/s aspiration observed with patient drinking approximately 4-5 ounces. She also took some bites of chocolate pudding and did not exhibit any difficulty or significant delays with that. When she was finished, she started handing the cup to SLP, saying "ok" and when asked if she wanted more pudding, she shook her head no. Patient was able to respond to yes/no questions regarding wants/needs such as having TV on or off, having bed reclined and based on her reactions, her responses seemed to be accurate/intentional. SLP will continue to follow for cognitive-linguistic goals but swallow goals have been met.    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  Head CT and brain MRI were negative for acute abnormalities.  She has no previous history of seizures, no family history of seizures, no history of brain surgery, no history of meningitis or encephalitis, and history of 1 fall in which she hit her head and had to have stitches but no other serious head injuries.  At baseline, she is alert and oriented, able to do all of her ADLs and cares for her husband.      SLP Plan  Continue with current  plan of care      Recommendations for follow up therapy are one component of a multi-disciplinary discharge planning process, led by the attending physician.  Recommendations may be updated based on patient status, additional functional criteria and insurance authorization.    Recommendations  Diet recommendations: Regular;Thin liquid Liquids provided via: Cup;Straw Medication Administration: Whole meds with liquid Supervision: Patient able to self feed;Full supervision/cueing for compensatory strategies Compensations: Minimize environmental distractions;Slow rate;Small sips/bites Postural Changes and/or Swallow Maneuvers: Seated upright 90 degrees                  Oral care BID   Frequent or constant Supervision/Assistance Dysphagia, unspecified (R13.10)     Continue with current plan of care     Angela Nevin, MA, CCC-SLP Speech Therapy

## 2023-02-23 NOTE — TOC Progression Note (Signed)
Transition of Care [facility]) - Progression Note    Patient Details  Name: Selena Branch MRN: [id number] Date of Birth: Nov 09, 1940  [facility]) CM/SW Contact  Mearl Latin, LCSW Phone Number: 02/23/2023, 5:00 PM  Clinical Narrative:    CSW continuing to follow.    Expected Discharge Plan: Skilled Nursing Facility Barriers to Discharge: Continued Medical Work up, English as a second language teacher, Requiring sitter/restraints, SNF Pending bed offer  Expected Discharge Plan and Services In-house Referral: Clinical Social Work   Post Acute Care Choice: Skilled Nursing Facility Living arrangements for the past 2 months: Single Family Home                                       Social Determinants of Health (SDOH) Interventions SDOH Screenings   Depression (PHQ2-9): Low Risk  (09/28/2019)  Tobacco Use: Low Risk  (02/04/2023)    Readmission Risk Interventions     No data to display

## 2023-02-23 NOTE — Progress Notes (Signed)
Inpatient Rehab Admissions Coordinator:    Pt is more alert today, able to answer my questions and was sitting calmly in her bed when I arrived in her room. She participated well with PT yesterday and did not require a sitter overnight. If she does well with OT today, I will send case to insurance for review.   Megan Salon, MS, CCC-SLP Rehab Admissions Coordinator  [phone] (celll) [phone] (office)

## 2023-02-23 NOTE — Progress Notes (Signed)
Physical Therapy Treatment Patient Details Name: Selena Branch MRN: [id number] DOB: Aug 16, 1941 Today's Date: 02/23/2023   History of Present Illness Selena Branch is a 82 y.o. female admitted to [facility] on 5/24 with sudden onset altered mental status/aphasia. HQI:[id number]; EEG: positive for seizures. PMHx: CLL currently in remission, glaucoma, hypertension, thrombocytopenia, hyperlipidemia UXL:[id number]; EEG: positive for seizures.    PT Comments    Pt greeted resting in bed and eager for mobility with pt verbalizing excitement to conduct session outside. Pt continues to be limited by impaired cognition, decreased activity tolerance, and weakness. Pt able to complete bed mobility and transfers to stand with up to mod A to come to full upright standing with increased cues and physical assist at hips and knees for hip/knee extension this session. Pt needing increased assist this session for gait attempts with pt unable to correct low foot clearance and with decreased ability to balance in standing with HHA, needing up to max to maintain balance with pt kness flexed throughout. Current plan remains appropriate to address deficits and maximize functional independence and decrease caregiver burden. Pt continues to benefit from skilled PT services to progress toward functional mobility goals.    Recommendations for follow up therapy are one component of a multi-disciplinary discharge planning process, led by the attending physician.  Recommendations may be updated based on patient status, additional functional criteria and insurance authorization.  Follow Up Recommendations  Can patient physically be transported by private vehicle: No    Assistance Recommended at Discharge Frequent or constant Supervision/Assistance  Patient can return home with the following A lot of help with walking and/or transfers;A lot of help with bathing/dressing/bathroom;Direct supervision/assist for medications management;Direct  supervision/assist for financial management;Assistance with cooking/housework;Assist for transportation;Help with stairs or ramp for entrance   Equipment Recommendations  Other (comment) (defer to next level of care)    Recommendations for Other Services       Precautions / Restrictions Precautions Precautions: Fall Precaution Comments: seizure Restrictions Weight Bearing Restrictions: Yes     Mobility  Bed Mobility Overal bed mobility: Needs Assistance Bed Mobility: Supine to Sit, Sit to Supine     Supine to sit: HOB elevated, Min assist Sit to supine: HOB elevated, Min assist   General bed mobility comments: minA for trunk support into sitting, assist for BLE management back into the bed, increased time required.    Transfers Overall transfer level: Needs assistance Equipment used: 1 person hand held assist, 2 person hand held assist Transfers: Sit to/from Stand, Bed to chair/wheelchair/BSC Sit to Stand: Min assist, Mod assist   Step pivot transfers: Mod assist, +2 physical assistance       General transfer comment: min-modA to power up to stand with increased cueing for upright posture, steady assist provided. Pt with increased cueing and trunk flexion with pivoting transfer with increased assist for balance and support, +2 mod A to step pivot from trasnport chair to bench outside, able to come to stand x6 throughout session    Ambulation/Gait Ambulation/Gait assistance: Mod assist, Max assist Gait Distance (Feet): 5 Feet (x2) Assistive device: 1 person hand held assist Gait Pattern/deviations: Step-through pattern, Decreased stride length, Shuffle, Trunk flexed Gait velocity: decr     General Gait Details: mod A with HHA to steady and maintain balance pt shuffling feet needing cues for increased clearance and for uprioght posture as pt with increased bil knee flexion throughout   Praxair  Mobility    Modified Rankin (Stroke  Patients Only)       Balance Overall balance assessment: Needs assistance Sitting-balance support: Single extremity supported, Feet supported Sitting balance-Leahy Scale: Fair Sitting balance - Comments: sitting EOB   Standing balance support: Bilateral upper extremity supported, Reliant on assistive device for balance Standing balance-Leahy Scale: Poor Standing balance comment: reliant on UE and external support                            Cognition Arousal/Alertness: Awake/alert Behavior During Therapy: Restless, Flat affect Overall Cognitive Status: Impaired/Different from baseline Area of Impairment: Orientation, Attention, Memory, Following commands, Safety/judgement, Awareness, Problem solving                 Orientation Level: Disoriented to, Place, Time, Situation Current Attention Level: Focused Memory: Decreased recall of precautions, Decreased short-term memory Following Commands: Follows one step commands inconsistently, Follows one step commands with increased time Safety/Judgement: Decreased awareness of safety, Decreased awareness of deficits Awareness: Intellectual Problem Solving: Slow processing, Difficulty sequencing, Requires verbal cues, Requires tactile cues General Comments: pt able to follow simple single step commands with increased time        Exercises Other Exercises Other Exercises: sit<>stand x3    General Comments General comments (skin integrity, edema, etc.): VSS on RA      Pertinent Vitals/Pain Pain Assessment Pain Assessment: Faces Faces Pain Scale: Hurts little more Pain Location: discomfort with L hand ROM Pain Descriptors / Indicators: Grimacing, Guarding Pain Intervention(s): Monitored during session, Limited activity within patient's tolerance    Home Living                          Prior Function            PT Goals (current goals can now be found in the care plan section) Acute Rehab PT  Goals PT Goal Formulation: Patient unable to participate in goal setting Time For Goal Achievement: 03/08/23 Progress towards PT goals: Progressing toward goals    Frequency    Min 3X/week      PT Plan Current plan remains appropriate    Co-evaluation PT/OT/SLP Co-Evaluation/Treatment: Yes Reason for Co-Treatment: For patient/therapist safety;To address functional/ADL transfers PT goals addressed during session: Mobility/safety with mobility;Balance        AM-PAC PT "6 Clicks" Mobility   Outcome Measure  Help needed turning from your back to your side while in a flat bed without using bedrails?: A Little Help needed moving from lying on your back to sitting on the side of a flat bed without using bedrails?: A Little Help needed moving to and from a bed to a chair (including a wheelchair)?: A Lot Help needed standing up from a chair using your arms (e.g., wheelchair or bedside chair)?: A Lot Help needed to walk in hospital room?: Total Help needed climbing 3-5 steps with a railing? : Total 6 Click Score: 12    End of Session Equipment Utilized During Treatment: Gait belt Activity Tolerance: Patient tolerated treatment well Patient left: with call bell/phone within reach;in bed;with bed alarm set Nurse Communication: Mobility status PT Visit Diagnosis: Muscle weakness (generalized) (M62.81);Other abnormalities of gait and mobility (R26.89);Unsteadiness on feet (R26.81)     Time: 1478-2956 PT Time Calculation (min) (ACUTE ONLY): 39 min  Charges:  $Therapeutic Exercise: 23-37 mins  Lenora Boys. PTA Acute Rehabilitation Services Office: [phone]   Catalina Antigua 02/23/2023, 4:05 PM

## 2023-02-23 NOTE — Progress Notes (Addendum)
Subjective: Selena Branch.   ROS: Unable to obtain due to poor mental status  Examination  Vital signs in last 24 hours: Temp:  [97.4 F (36.3 C)-99.2 F (37.3 C)] 97.4 F (36.3 C) (06/12 0834) Pulse Rate:  [68-78] 70 (06/11 2343) Resp:  [16-18] 18 (06/12 0834) BP: (97-147)/(35-63) 117/55 (06/12 0900) SpO2:  [92 %-95 %] 94 % (06/12 0900) Weight:  [49.6 kg] 49.6 kg (06/12 0900)  General: lying in bed, NAD Neuro: awake, told me her name but didn't answer rest of the orientation questions, didn't follow commands, CN appear intact, spontaneously moving all extremities   Basic Metabolic Panel: Recent Labs  Lab 02/17/23 0651 02/18/23 0539 02/19/23 0122 02/20/23 0529 02/22/23 0334  NA 139 138 137 137 140  K 3.7 3.6 3.9 3.8 4.2  CL 106 105 105 104 107  CO2 23 21* 21* 24 25  GLUCOSE 148* 110* 99 121* 109*  BUN 27* 17 23 22  31*  CREATININE 0.69 0.61 0.72 0.70 0.98  CALCIUM 8.3* 8.4* 8.3* 9.0 9.0  MG 2.2 2.1  --   --   --   PHOS 4.0 3.0  --   --   --     CBC: Recent Labs  Lab 02/17/23 0651 02/18/23 0539 02/22/23 0334  WBC 8.7 11.3* 9.1  HGB 11.2* 12.3 12.0  HCT 33.6* 37.5 36.7  MCV 91.8 90.6 94.1  PLT 223 266 288     Coagulation Studies: No results for input(s): "LABPROT", "INR" in the last 72 hours.  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     Focal electrographic status epilepticus, resolved ICH, POA Acute encephalopathy, improving Hyperammonemia - Seizures most likely secondary to underlying ICH - Status epilepticus has resolved.  EEG was also improved.  However patient continues to be encephalopathic.  This could be secondary to medications, delirium   Recommendations - Stop depakote to  minimize sedation - Continue vimpat 200mg  BID, can switch to PO - Continue seizure precautions, delirium precautions - As needed IV Versed for clinical seizures lasting more than 2 minutes - Discussed plan with Dr. Jerral Ralph via secure chart   I have spent a total of  36  minutes with the patient reviewing hospital notes,  test results, labs and examining the patient as well as establishing an assessment and plan.  > 50% of time was spent in direct patient care.    Lindie Spruce Epilepsy [facility] For questions after 5pm please refer to AMION to reach the Neurologist on call

## 2023-02-23 NOTE — Progress Notes (Signed)
PHARMACIST - PHYSICIAN COMMUNICATION  DR:   Veronia Beets  CONCERNING: IV to Oral Route Change Policy  RECOMMENDATION: This patient is receiving Protonix/Vimpat by the intravenous route.  Based on criteria approved by the Pharmacy and Therapeutics Committee, the intravenous medication(s) is/are being converted to the equivalent oral dose form(s).   DESCRIPTION: These criteria include: The patient is eating (either orally or via tube) and/or has been taking other orally administered medications for a least 24 hours The patient has no evidence of active gastrointestinal bleeding or impaired GI absorption (gastrectomy, short bowel, patient on TNA or NPO).  If you have questions about this conversion, please contact the Pharmacy Department  []   [phone] )  [facility] []   [phone] )  [facility] [x]   [phone] )  [facility] []   [phone] )  [facility] []   [phone] )  [facility]

## 2023-02-23 NOTE — Progress Notes (Signed)
PROGRESS NOTE        PATIENT DETAILS Name: Selena Branch Age: 82 y.o. Sex: female Date of Birth: [birth year] Admit Date: 02/04/2023 Admitting Physician Rodolph Bong, MD NWG:[id number], Toniann Fail, MD  Brief Summary: Patient is a 82 y.o.  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  Unfortunately-further hospital course complicated by delirium, rectus muscle hematoma with acute blood loss anemia.    Significant studies: 5/24>> CT head: No acute abnormality. 5/24>> CT angio head/neck: No LVO/significant stenosis 5/24>> MRI brain: No acute intracranial process. 6/02>> CT abdomen/pelvis: Pelvic hematoma, intramuscular hemorrhage of right lower rectus muscle, hemoperitoneum suspected. 6/10-6/11>> LTM EEG: No seizures.  Significant microbiology data: None  Procedures: 5/24-5/25>> LTM EEG: Status epilepticus-left hemisphere-left parietal region 6/02>> Spot EEG: No definite seizures. 6/02-6/03>> LTM EEG: No seizures  Consults: Neurology  Subjective: Seen multiple times this morning-was sleepy but easily arousable-then seen a few hours later-much more awake/alert-and then seen this afternoon-agitated/sleepy and more confused.  Objective: Vitals: Blood pressure (!) 133/44, pulse 70, temperature 98 F (36.7 C), temperature source Oral, resp. rate 18, weight 49.6 kg, SpO2 94 %.   Exam: Gen Exam:not in any distress HEENT:atraumatic, normocephalic Chest: B/L clear to auscultation anteriorly CVS:S1S2 regular Abdomen:soft non tender, non distended Extremities:no edema Neurology: Non focal Skin: no rash  Pertinent Labs/Radiology:    Latest Ref Rng & Units 02/22/2023    3:34 AM 02/18/2023    5:39 AM 02/17/2023    6:51 AM  CBC  WBC 4.0 - 10.5 K/uL 9.1  11.3  8.7   Hemoglobin 12.0 - 15.0 g/dL 86.5  78.4  69.6   Hematocrit 36.0 - 46.0 % 36.7  37.5  33.6    Platelets 150 - 400 K/uL 288  266  223     Lab Results  Component Value Date   NA 140 02/22/2023   K 4.2 02/22/2023   CL 107 02/22/2023   CO2 25 02/22/2023      Assessment/Plan: Acute metabolic encephalopathy/expressive aphasia secondary to status epilepticus Status epilepticus-repeat LTM EEG on 6/10 negative Remains on Vimpat Depakote being discontinued to minimize sedation.     Delirium Likely due to acute illness/hospital delirium She probably has very poor brain reserve/some amount of chronic cognitive issues following ICH Continues to have waxing and waning course of confusion-a bit more sedated for the past 2 days-Depakote being discontinued today-Seroquel dosage de-escalated to just 25 mg nightly.  Continue melatonin.   Continue to maintain delirium precautions.  Rectus muscle hematoma/pelvic hematoma/possible hemoperitoneum with acute blood loss anemia Resolved-benign abdominal exam  Hb now stable after multiple units of PRBC transfusion Follow CBC  History of ICH Stable-supportive care-nonfocal exam  History of CLL In remission-WBC remains stable  Hx of ITP Continue Promacta Platelet count stable Appreciate hematology input.  HTN BP stable Coreg/hydralazine  HLD Statin  Acute urine retention UTI Foley removed 6/7-voiding spontaneously. Completed Rocephin x 3 days for presumed UTI.  Mood disorder Zoloft  Nutrition Status: Nutrition Problem: Moderate Malnutrition Etiology: chronic illness Signs/Symptoms: mild fat depletion, moderate muscle depletion Interventions: Magic cup  BMI: Estimated body mass index is 22.85 kg/m as calculated from the following:   Height as of 05/29/21: 4\' 10"  (1.473 m).   Weight as of this encounter: 49.6 kg.   Code status:  Code Status: DNR   DVT Prophylaxis: Place and maintain sequential compression device Start: 02/14/23 0709 SCDs Start: 02/04/23 1729.  Avoiding pharmacological agents given rectus sheath  hematoma/ABLA.   Family Communication: Daughter and spouse at bedside   Disposition Plan: Status is: Inpatient Remains inpatient appropriate because: Severity of illness   Planned Discharge Destination:Skilled nursing facility   Diet: Diet Order             Diet regular Room service appropriate? No; Fluid consistency: Thin  Diet effective now                     Antimicrobial agents: Anti-infectives (From admission, onward)    Start     Dose/Rate Route Frequency Ordered Stop   02/08/23 0600  cefTRIAXone (ROCEPHIN) 1 g in sodium chloride 0.9 % 100 mL IVPB        1 g 200 mL/hr over 30 Minutes Intravenous Every 24 hours 02/07/23 0600 02/09/23 0547   02/07/23 0700  cefTRIAXone (ROCEPHIN) 1 g in sodium chloride 0.9 % 100 mL IVPB        1 g 200 mL/hr over 30 Minutes Intravenous  Once 02/07/23 0601 02/07/23 0645        MEDICATIONS: Scheduled Meds:  amLODipine  10 mg Oral QHS   atorvastatin  40 mg Per Tube Daily   carvedilol  3.125 mg Oral BID WC   chlorhexidine gluconate (MEDLINE KIT)  15 mL Mouth Rinse BID   Chlorhexidine Gluconate Cloth  6 each Topical Daily   dorzolamide-timolol  1 drop Both Eyes Daily   eltrombopag  25 mg Oral Q M,W,F   hydrALAZINE  25 mg Oral Q8H   lacosamide  200 mg Oral BID   latanoprost  1 drop Both Eyes QHS   melatonin  5 mg Oral QHS   multivitamin with minerals  1 tablet Per Tube Daily   pantoprazole  40 mg Oral BID   QUEtiapine  25 mg Oral QHS   sertraline  25 mg Per Tube Daily   silver sulfADIAZINE   Topical BID   sodium chloride flush  3 mL Intravenous Q12H   Continuous Infusions:   PRN Meds:.acetaminophen **OR** acetaminophen, haloperidol lactate **OR** haloperidol lactate, hydrALAZINE, midazolam, ondansetron **OR** ondansetron (ZOFRAN) IV, polyethylene glycol, sorbitol   I have personally reviewed following labs and imaging studies  LABORATORY DATA: CBC: Recent Labs  Lab 02/17/23 0651 02/18/23 0539 02/22/23 0334  WBC  8.7 11.3* 9.1  HGB 11.2* 12.3 12.0  HCT 33.6* 37.5 36.7  MCV 91.8 90.6 94.1  PLT 223 266 288     Basic Metabolic Panel: Recent Labs  Lab 02/17/23 0651 02/18/23 0539 02/19/23 0122 02/20/23 0529 02/22/23 0334  NA 139 138 137 137 140  K 3.7 3.6 3.9 3.8 4.2  CL 106 105 105 104 107  CO2 23 21* 21* 24 25  GLUCOSE 148* 110* 99 121* 109*  BUN 27* 17 23 22  31*  CREATININE 0.69 0.61 0.72 0.70 0.98  CALCIUM 8.3* 8.4* 8.3* 9.0 9.0  MG 2.2 2.1  --   --   --   PHOS 4.0 3.0  --   --   --      GFR: CrCl cannot be calculated (Unknown ideal weight.).  Liver Function Tests: No results for input(s): "AST", "ALT", "ALKPHOS", "BILITOT", "PROT", "ALBUMIN" in the last 168 hours.  No results for input(s): "LIPASE", "AMYLASE" in the last 168 hours. Recent Labs  Lab 02/21/23 1301 02/22/23 0812  AMMONIA 65*  41*     Coagulation Profile: No results for input(s): "INR", "PROTIME" in the last 168 hours.  Cardiac Enzymes: No results for input(s): "CKTOTAL", "CKMB", "CKMBINDEX", "TROPONINI" in the last 168 hours.  BNP (last 3 results) No results for input(s): "PROBNP" in the last 8760 hours.  Lipid Profile: No results for input(s): "CHOL", "HDL", "LDLCALC", "TRIG", "CHOLHDL", "LDLDIRECT" in the last 72 hours.  Thyroid Function Tests: No results for input(s): "TSH", "T4TOTAL", "FREET4", "T3FREE", "THYROIDAB" in the last 72 hours.  Anemia Panel: No results for input(s): "VITAMINB12", "FOLATE", "FERRITIN", "TIBC", "IRON", "RETICCTPCT" in the last 72 hours.  Urine analysis:    Component Value Date/Time   COLORURINE YELLOW 02/04/2023 1723   APPEARANCEUR HAZY (A) 02/04/2023 1723   LABSPEC 1.013 02/04/2023 1723   PHURINE 6.0 02/04/2023 1723   GLUCOSEU NEGATIVE 02/04/2023 1723   HGBUR LARGE (A) 02/04/2023 1723   BILIRUBINUR NEGATIVE 02/04/2023 1723   KETONESUR NEGATIVE 02/04/2023 1723   PROTEINUR NEGATIVE 02/04/2023 1723   UROBILINOGEN 0.2 05/06/2010 1611   NITRITE NEGATIVE  02/04/2023 1723   LEUKOCYTESUR TRACE (A) 02/04/2023 1723    Sepsis Labs: Lactic Acid, Venous    Component Value Date/Time   LATICACIDVEN 0.8 09/17/2019 0131    MICROBIOLOGY: No results found for this or any previous visit (from the past 240 hour(s)).  RADIOLOGY STUDIES/RESULTS: Overnight EEG with video  Result Date: 02/22/2023 Charlsie Quest, MD     02/22/2023 11:34 AM Patient Name: Selena Branch MRN: [id number] Epilepsy Attending: Charlsie Quest Referring Physician/Provider: Charlsie Quest, MD Duration: 02/21/2023 1428 to 02/22/2023 2130  Patient history: 82 y.o. female with a past medical history of CLL, ITP, left parietal ICH in 2021 presenting with aphasia and generalized weakness. EEG to evaluate for seizure.  Level of alertness: awake. asleep  AEDs during EEG study: LCM, VPA  Technical aspects: This EEG study was done with scalp electrodes positioned according to the 10-20 International system of electrode placement. Electrical activity was reviewed with band pass filter of 1-70Hz , sensitivity of 7 uV/mm, display speed of 92mm/sec with a 60Hz  notched filter applied as appropriate. EEG data were recorded continuously and digitally stored.  Video monitoring was available and reviewed as appropriate.  Description:  The posterior dominant rhythm consists of 8-9 Hz activity of moderate voltage (25-35 uV) seen predominantly in posterior head regions, symmetric and reactive to eye opening and eye closing.  Sleep was characterized by sleep spindles (12 to 14 Hz), maximal frontocentral region.  EEG showed continuous generalized and lateralized left hemisphere 5-9Hz  theta-alpha activity admixed with intermittent 2-3hz  delta slowing. Hyperventilation and photic stimulation were not performed.   ABNORMALITY - Continuous slow, generalized and lateralized left hemisphere  IMPRESSION: This study is suggestive of cortical dysfunction arising from left hemisphere likely secondary to underlying structural  abnormality. Additionally there is moderate diffuse encephalopathy. No definite seizures were seen.  Charlsie Quest    LOS: 18 days   Jeoffrey Massed, MD  [facility]    To contact the attending provider between 7A-7P or the covering provider during after hours 7P-7A, please log into the web site [email or web address] and access using universal [place] password for that web site. If you do not have the password, please call the hospital operator.  02/23/2023, 1:28 PM

## 2023-02-23 NOTE — Progress Notes (Signed)
Occupational Therapy Treatment Patient Details Name: Selena Branch MRN: [id number] DOB: [birth year] Today's Date: 02/23/2023   History of present illness Selena Branch is a 82 y.o. female admitted to [facility] on 5/24 with sudden onset altered mental status/aphasia. RSW:[id number]; EEG: positive for seizures. PMHx: CLL currently in remission, glaucoma, hypertension, thrombocytopenia, hyperlipidemia KKX:[id number]; EEG: positive for seizures.   OT comments  Pt supine in bed with HOB elevated upon OT arrival. Pt agreeable to participation in skilled OT session and expressed excitement regarding going outside during session. Pt participated well in session and is making inconsistent progress toward OT goals, making slow but consistent progress toward UB ADL goals and goal related to attention to task and limited progress toward goals related to functional transfers and navigating in the environment secondary to impaired cognition and continued decreased activity tolerance and generalized weakness. OT goals updated this day and OT frequency increased. Currently, pt completes UB ADLs with Set up to Min assist, LB ADLs with Mod to Max assist, and functional transfers with Mod assist of +1 to +2. Pt demonstrates ability to consistently follow 1 step commands during ADLs and for UB positioning/movement during bed mobility and functional transfers, but demonstrated decreased ability to follow 1 step commands this day related to positioning/moving LB during bed mobility and transfers. Pt also presented this day with increased word salad when answering questions and increased pain in Left hand and holding hand in a fist. OT provided pt with Left palm guard to improve L hand positioning at rest with pt reporting palm guard is comfortable. Pt demonstrates increased alertness in sitting and when outside and participated well in skilled OT session. Pt will benefit from continued acute skilled OT services to address deficits and  increase safety and independence with ADLs and functional transfers. Discharge plan remains appropriate.    Recommendations for follow up therapy are one component of a multi-disciplinary discharge planning process, led by the attending physician.  Recommendations may be updated based on patient status, additional functional criteria and insurance authorization.    Assistance Recommended at Discharge Frequent or constant Supervision/Assistance  Patient can return home with the following  A lot of help with walking and/or transfers;A lot of help with bathing/dressing/bathroom;Assistance with cooking/housework;Direct supervision/assist for medications management;Direct supervision/assist for financial management;Assist for transportation;Help with stairs or ramp for entrance   Equipment Recommendations  Other (comment) (Defer to next levle of care)    Recommendations for Other Services Rehab consult    Precautions / Restrictions Precautions Precautions: Fall Precaution Comments: seizure Restrictions Weight Bearing Restrictions: Yes       Mobility Bed Mobility Overal bed mobility: Needs Assistance Bed Mobility: Supine to Sit, Sit to Supine     Supine to sit: Min assist, HOB elevated Sit to supine: Min assist, HOB elevated   General bed mobility comments: minA for trunk support into sitting, Min assist for BLE management back into the bed, increased time required.    Transfers Overall transfer level: Needs assistance Equipment used: 1 person hand held assist, 2 person hand held assist Transfers: Sit to/from Stand, Bed to chair/wheelchair/BSC Sit to Stand: Min assist, Mod assist     Step pivot transfers: Mod assist, +2 physical assistance     General transfer comment: min-modA to power up to stand with increased cueing for upright posture, steady assist provided. Pt with increased cueing and trunk flexion with pivoting transfer with increased assist for balance and support, +2  mod A to step pivot from trasnport chair to  bench outside, able to come to stand x6 throughout session     Balance Overall balance assessment: Needs assistance Sitting-balance support: Single extremity supported, No upper extremity supported, Feet supported Sitting balance-Leahy Scale: Fair Sitting balance - Comments: sitting EOB while washing face and donning gown   Standing balance support: Bilateral upper extremity supported, Reliant on assistive device for balance Standing balance-Leahy Scale: Poor Standing balance comment: reliant on UE and external support +1 to +2                           ADL either performed or assessed with clinical judgement   ADL Overall ADL's : Needs assistance/impaired Eating/Feeding: Set up;Sitting (sitting with back and feet supported)   Grooming: Wash/dry hands;Wash/dry face;Set up;Sitting (Sitting EOB with cues for initiation)           Upper Body Dressing : Minimal assistance;Sitting;Cueing for sequencing (Sitting EOB)                          Extremity/Trunk Assessment Upper Extremity Assessment Upper Extremity Assessment: Generalized weakness;LUE deficits/detail LUE Deficits / Details: generalized weakness; pt holding Left hand closed this day with pt reporting increased pain in Left hand and demonstrating grimacing and guarding with PROM/AAROM of digits of Left hand. OT provided Left palm guard to improve positioning of pt Left hand at rest. OT reported pt hand pain, guarding, and use of Left palm guard to RN.   Lower Extremity Assessment Lower Extremity Assessment: Defer to PT evaluation        Vision       Perception     Praxis Praxis Praxis: Impaired Praxis Impairment Details: Motor planning;Initiation (Organization) Praxis-Other Comments: Pt requires increased time for initiation and motor planning and cues for sequencing.    Cognition Arousal/Alertness: Awake/alert Behavior During Therapy: Restless,  Flat affect Overall Cognitive Status: Impaired/Different from baseline Area of Impairment: Orientation, Attention, Memory, Following commands, Safety/judgement, Awareness, Problem solving                 Orientation Level: Disoriented to, Place, Time, Situation Current Attention Level: Focused Memory: Decreased recall of precautions, Decreased short-term memory Following Commands: Follows one step commands inconsistently, Follows one step commands with increased time Safety/Judgement: Decreased awareness of safety, Decreased awareness of deficits Awareness: Intellectual Problem Solving: Slow processing, Difficulty sequencing, Requires verbal cues, Requires tactile cues General Comments: With extra time and cues, pt demonstrates ability to consistantly follow 1 step commands related to washing face, drinking water, reaching, hand positioning during bed mobility and transfers, and grsping/releasing this day. However, pt demonstrated inconsistant ability to follow 1 step commands related to moving/positioning B LE and coming to full stand on this day.        Exercises      Shoulder Instructions       General Comments Pt made good attempts to participation and answer verbal questions and express thoughts/needs throughout session. However, pt presented with need for increased time and cues and wtih increased mumbling and word salad this session compared to last skilled OT session. Pt VSS on RA throughout session.    Pertinent Vitals/ Pain       Pain Assessment Pain Assessment: Faces Faces Pain Scale: Hurts little more Pain Location: Digits of Left hand with PROM/AAROM Pain Descriptors / Indicators: Grimacing, Guarding Pain Intervention(s): Monitored during session, Limited activity within patient's tolerance, Repositioned, Other (comment) (Provided pt with Left palm guard  to improve positioning of digits of Left hand at rest)  Home Living                                           Prior Functioning/Environment              Frequency  Min 2X/week        Progress Toward Goals  OT Goals(current goals can now be found in the care plan section)  Progress towards OT goals: Goals met and updated - see care plan;Goals drowngraded-see care plan  Acute Rehab OT Goals Patient Stated Goal: Pt unable to state this day Time For Goal Achievement: 03/09/23 Potential to Achieve Goals: Good ADL Goals Pt Will Perform Grooming: with supervision;standing Pt Will Transfer to Toilet: with min guard assist;bedside commode (step pivot transfer) Additional ADL Goal #1: Patient will demonstrate ability to identify and gather 2 appropriate items needed for a specified ADL task when presented with a field of 5 items on table with Set up and Min cues to maintain attention to task. Additional ADL Goal #2: Pt will attend to 3-step ADL task for 5 minutes with min verbal cues for redirection.  Plan Other (comment);Discharge plan remains appropriate;Frequency needs to be updated (Goals and frequency updated)    Co-evaluation    PT/OT/SLP Co-Evaluation/Treatment: Yes Reason for Co-Treatment: For patient/therapist safety PT goals addressed during session: Mobility/safety with mobility;Proper use of DME OT goals addressed during session: Other (comment);ADL's and self-care (Following 1 step commands, navigating in environment)      AM-PAC OT "6 Clicks" Daily Activity     Outcome Measure   Help from another person eating meals?: A Little Help from another person taking care of personal grooming?: A Little Help from another person toileting, which includes using toliet, bedpan, or urinal?: A Lot Help from another person bathing (including washing, rinsing, drying)?: A Lot Help from another person to put on and taking off regular upper body clothing?: A Little Help from another person to put on and taking off regular lower body clothing?: A Lot 6 Click Score: 15     End of Session Equipment Utilized During Treatment: Gait belt;Other (comment) (transport wheelchair for mobility from pt's room to outside courtyard)  OT Visit Diagnosis: Other abnormalities of gait and mobility (R26.89);Other symptoms and signs involving cognitive function;Cognitive communication deficit (R41.841);Muscle weakness (generalized) (M62.81) Symptoms and signs involving cognitive functions:  (seizures)   Activity Tolerance Patient tolerated treatment well   Patient Left in bed;with call bell/phone within reach;with bed alarm set;Other (comment) (with B mitts reapplied)   Nurse Communication Mobility status;Other (comment) (Pt with pain in Left hand with PROM/AAROM of digits. OT provided pt with Left palm guard to improve positioning of Left hand at rest. Pt with increased word salad.)        Time: [id number] OT Time Calculation (min): 44 min  Charges: OT General Charges $OT Visit: 1 Visit OT Treatments $Self Care/Home Management : 8-22 mins  Elenore Wanninger "Orson Eva., OTR/L, MA Acute Rehab [phone]   Lendon Colonel 02/23/2023, 4:50 PM

## 2023-02-24 DIAGNOSIS — R569 Unspecified convulsions: Secondary | ICD-10-CM | POA: Diagnosis not present

## 2023-02-24 DIAGNOSIS — I10 Essential (primary) hypertension: Secondary | ICD-10-CM | POA: Diagnosis not present

## 2023-02-24 DIAGNOSIS — S301XXS Contusion of abdominal wall, sequela: Secondary | ICD-10-CM | POA: Diagnosis not present

## 2023-02-24 DIAGNOSIS — D696 Thrombocytopenia, unspecified: Secondary | ICD-10-CM | POA: Diagnosis not present

## 2023-02-24 NOTE — Progress Notes (Signed)
Physical Therapy Treatment Patient Details Name: Selena Branch MRN: [id number] DOB: 10-16-1940 Today's Date: 02/24/2023   History of Present Illness Selena Branch is a 82 y.o. female admitted to [facility] on 5/24 with sudden onset altered mental status/aphasia. [id number]; EEG: positive for seizures. PMHx: CLL currently in remission, glaucoma, hypertension, thrombocytopenia, hyperlipidemia ZHY:QMVHQION; EEG: positive for seizures.    PT Comments    Pt putting forth good effort this session, however continues to be limited by impaired cognition and poor balance/postural reactions. Pt needing min-mod A to come to full upright standing this session and maintain statically ~71mins before fatigue with grossly min A needed for balance. Pt able to take steps along EOB to [facility] with increased time and multimodal cues. Pt continues to need cues and assist for safety awareness throughout all mobility. Current plan remains appropriate to address deficits and maximize functional independence and decrease caregiver burden. Pt continues to benefit from skilled PT services to progress toward functional mobility goals.    Recommendations for follow up therapy are one component of a multi-disciplinary discharge planning process, led by the attending physician.  Recommendations may be updated based on patient status, additional functional criteria and insurance authorization.  Follow Up Recommendations  Can patient physically be transported by private vehicle: No    Assistance Recommended at Discharge Frequent or constant Supervision/Assistance  Patient can return home with the following A lot of help with walking and/or transfers;A lot of help with bathing/dressing/bathroom;Direct supervision/assist for medications management;Direct supervision/assist for financial management;Assistance with cooking/housework;Assist for transportation;Help with stairs or ramp for entrance   Equipment Recommendations  Other (comment)  (defer to next level of care)    Recommendations for Other Services       Precautions / Restrictions Precautions Precautions: Fall Precaution Comments: seizure Restrictions Weight Bearing Restrictions: Yes     Mobility  Bed Mobility Overal bed mobility: Needs Assistance Bed Mobility: Supine to Sit, Sit to Supine     Supine to sit: Min assist, HOB elevated Sit to supine: Min assist, HOB elevated   General bed mobility comments: minA for trunk support into sitting, Min assist for BLE management back into the bed, increased time required.    Transfers Overall transfer level: Needs assistance Equipment used: 1 person hand held assist, 2 person hand held assist Transfers: Sit to/from Stand, Bed to chair/wheelchair/BSC Sit to Stand: Min assist, Mod assist           General transfer comment: min-modA to power up to stand with increased with multimodal cues for upright posture with pt able to correct and maintain standing ~32mins with down to min A to maintain balance    Ambulation/Gait Ambulation/Gait assistance: Mod assist, Max assist Gait Distance (Feet): 3 Feet Assistive device: 1 person hand held assist Gait Pattern/deviations: Step-through pattern, Decreased stride length, Shuffle, Trunk flexed Gait velocity: decr     General Gait Details: side stepping along EOB to HOB. pt needing multimodal cues to complete   Stairs             Wheelchair Mobility    Modified Rankin (Stroke Patients Only)       Balance Overall balance assessment: Needs assistance Sitting-balance support: Single extremity supported, No upper extremity supported, Feet supported Sitting balance-Leahy Scale: Fair Sitting balance - Comments: sitting EOB while washing face and donning gown   Standing balance support: Bilateral upper extremity supported, Reliant on assistive device for balance Standing balance-Leahy Scale: Poor Standing balance comment: reliant on UE and external  support +  1 to +2                            Cognition Arousal/Alertness: Awake/alert Behavior During Therapy: Restless, Flat affect Overall Cognitive Status: Impaired/Different from baseline Area of Impairment: Orientation, Attention, Memory, Following commands, Safety/judgement, Awareness, Problem solving                 Orientation Level: Disoriented to, Place, Time, Situation Current Attention Level: Focused Memory: Decreased recall of precautions, Decreased short-term memory Following Commands: Follows one step commands inconsistently, Follows one step commands with increased time Safety/Judgement: Decreased awareness of safety, Decreased awareness of deficits Awareness: Intellectual Problem Solving: Slow processing, Difficulty sequencing, Requires verbal cues, Requires tactile cues          Exercises Other Exercises Other Exercises: sit<>stand x3    General Comments        Pertinent Vitals/Pain Pain Assessment Pain Assessment: Faces Faces Pain Scale: Hurts even more Pain Location: Digits of Left hand with PROM/AAROM Pain Descriptors / Indicators: Grimacing, Guarding Pain Intervention(s): Monitored during session, Limited activity within patient's tolerance    Home Living                          Prior Function            PT Goals (current goals can now be found in the care plan section) Acute Rehab PT Goals Patient Stated Goal: get up PT Goal Formulation: Patient unable to participate in goal setting Time For Goal Achievement: 03/08/23 Progress towards PT goals: Progressing toward goals    Frequency    Min 3X/week      PT Plan Current plan remains appropriate    Co-evaluation              AM-PAC PT "6 Clicks" Mobility   Outcome Measure  Help needed turning from your back to your side while in a flat bed without using bedrails?: A Little Help needed moving from lying on your back to sitting on the side of a flat  bed without using bedrails?: A Little Help needed moving to and from a bed to a chair (including a wheelchair)?: A Lot Help needed standing up from a chair using your arms (e.g., wheelchair or bedside chair)?: A Lot Help needed to walk in hospital room?: Total Help needed climbing 3-5 steps with a railing? : Total 6 Click Score: 12    End of Session   Activity Tolerance: Patient tolerated treatment well Patient left: with call bell/phone within reach;in bed;with bed alarm set Nurse Communication: Mobility status PT Visit Diagnosis: Muscle weakness (generalized) (M62.81);Other abnormalities of gait and mobility (R26.89);Unsteadiness on feet (R26.81)     Time: 1610-9604 PT Time Calculation (min) (ACUTE ONLY): 17 min  Charges:  $Therapeutic Activity: 8-22 mins                     Gladis Soley R. PTA Acute Rehabilitation Services Office: [phone]   Catalina Antigua 02/24/2023, 4:46 PM

## 2023-02-24 NOTE — TOC Progression Note (Signed)
Transition of Care [facility]) - Progression Note    Patient Details  Name: Selena Branch MRN: [id number] Date of Birth: [birth year]  [facility]) CM/SW Contact  Mearl Latin, LCSW Phone Number: 02/24/2023, 5:15 PM  Clinical Narrative:    CSW received email from Dr. Logan Bores with [employer] expressing concerns over CIR level of care rather than SNF. CSW discussed case with patient's daughter and she is not sure what to do as she is not sure how patient will do in CIR for a short amount of time. CSW will follow up with Dr. Logan Bores. CSW also provided DSS contact info for daughter to apply for Medicaid in the event patient does not progress as expected.    Expected Discharge Plan: Skilled Nursing Facility Barriers to Discharge: Continued Medical Work up, English as a second language teacher, Requiring sitter/restraints, SNF Pending bed offer  Expected Discharge Plan and Services In-house Referral: Clinical Social Work   Post Acute Care Choice: Skilled Nursing Facility Living arrangements for the past 2 months: Single Family Home                                       Social Determinants of Health (SDOH) Interventions SDOH Screenings   Depression (PHQ2-9): Low Risk  (09/28/2019)  Tobacco Use: Low Risk  (02/04/2023)    Readmission Risk Interventions     No data to display

## 2023-02-24 NOTE — Progress Notes (Signed)
Subjective: NAEO. Kept asking to get up  ROS: Unable to obtain due to poor mental status  Examination  Vital signs in last 24 hours: Temp:  [98 F (36.7 C)-99 F (37.2 C)] 98.1 F (36.7 C) (06/13 4098) Pulse Rate:  [75-84] 75 (06/13 0614) Resp:  [18-19] 18 (06/13 0614) BP: (118-152)/(44-59) 118/55 (06/13 0614) SpO2:  [94 %-97 %] 97 % (06/12 2015) Weight:  [51.1 kg] 51.1 kg (06/13 0300)  General: lying in bed, NAD Neuro: awake, told me her name and that she is at hospital but not oriented to time, didn't follow commands, CN appear intact, spontaneously moving all extremities   Basic Metabolic Panel: Recent Labs  Lab 02/18/23 0539 02/19/23 0122 02/20/23 0529 02/22/23 0334  NA 138 137 137 140  K 3.6 3.9 3.8 4.2  CL 105 105 104 107  CO2 21* 21* 24 25  GLUCOSE 110* 99 121* 109*  BUN 17 23 22  31*  CREATININE 0.61 0.72 0.70 0.98  CALCIUM 8.4* 8.3* 9.0 9.0  MG 2.1  --   --   --   PHOS 3.0  --   --   --     CBC: Recent Labs  Lab 02/18/23 0539 02/22/23 0334  WBC 11.3* 9.1  HGB 12.3 12.0  HCT 37.5 36.7  MCV 90.6 94.1  PLT 266 288     Coagulation Studies: No results for input(s): "LABPROT", "INR" in the last 72 hours.  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     Focal electrographic status epilepticus, resolved ICH, POA Acute encephalopathy, stable - Seizures most likely secondary to underlying ICH - Status epilepticus has resolved.  EEG was also improved.  However patient continues to be encephalopathic.  This is most likely secondary to delirium   Recommendations - Continue Vimpat 200mg  BID - Continue seizure precautions, delirium precautions - As needed IV Versed for clinical  seizures lasting more than 2 minutes - Discussed plan with Dr. Jerral Ralph via secure chart   I have spent a total of  26  minutes with the patient reviewing hospital notes,  test results, labs and examining the patient as well as establishing an assessment and plan.  > 50% of time was spent in direct patient care.   Lindie Spruce Epilepsy [facility] For questions after 5pm please refer to AMION to reach the Neurologist on call

## 2023-02-24 NOTE — Progress Notes (Signed)
PROGRESS NOTE        PATIENT DETAILS Name: Selena Branch Age: 82 y.o. Sex: female Date of Birth: 10/31/1940 Admit Date: 02/04/2023 Admitting Physician Rodolph Bong, MD RUE:[id number], Toniann Fail, MD  Brief Summary: Patient is a 82 y.o.  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  Unfortunately-further hospital course complicated by delirium, rectus muscle hematoma with acute blood loss anemia.    Significant studies: 5/24>> CT head: No acute abnormality. 5/24>> CT angio head/neck: No LVO/significant stenosis 5/24>> MRI brain: No acute intracranial process. 6/02>> CT abdomen/pelvis: Pelvic hematoma, intramuscular hemorrhage of right lower rectus muscle, hemoperitoneum suspected. 6/10-6/11>> LTM EEG: No seizures.  Significant microbiology data: None  Procedures: 5/24-5/25>> LTM EEG: Status epilepticus-left hemisphere-left parietal region 6/02>> Spot EEG: No definite seizures. 6/02-6/03>> LTM EEG: No seizures  Consults: Neurology  Subjective: No family at bedside-she is more awake/alert-but still confused.  She can be redirected somewhat.  Objective: Vitals: Blood pressure (!) 132/50, pulse 75, temperature 98.1 F (36.7 C), temperature source Oral, resp. rate 18, weight 51.1 kg, SpO2 97 %.   Exam: Gen Exam:not in any distress HEENT:atraumatic, normocephalic Chest: B/L clear to auscultation anteriorly CVS:S1S2 regular Abdomen:soft non tender, non distended Extremities:no edema Neurology: Non focal Skin: no rash  Pertinent Labs/Radiology:    Latest Ref Rng & Units 02/22/2023    3:34 AM 02/18/2023    5:39 AM 02/17/2023    6:51 AM  CBC  WBC 4.0 - 10.5 K/uL 9.1  11.3  8.7   Hemoglobin 12.0 - 15.0 g/dL 19.1  47.8  29.5   Hematocrit 36.0 - 46.0 % 36.7  37.5  33.6   Platelets 150 - 400 K/uL 288  266  223     Lab Results  Component Value  Date   NA 140 02/22/2023   K 4.2 02/22/2023   CL 107 02/22/2023   CO2 25 02/22/2023      Assessment/Plan: Acute metabolic encephalopathy/expressive aphasia secondary to status epilepticus Status epilepticus-repeat LTM EEG on 6/10 negative Remains on Vimpat Initially on Keppra-that was stopped and she was switched to Depakote because of agitated delirium-however she became more drowsy-hence Depakote was discontinued on 6/12.  Delirium Likely due to acute illness/hospital delirium She probably has very poor brain reserve/some amount of chronic cognitive issues following ICH More awake/alert after adjustment of Seroquel dosage-discontinuation of Depakote.  She remains confused-with at times being redirectable.  Continue to maintain delirium precautions. Neurology recommended we continue supportive care-allow body to wash out Depakote-and repeat MRI in a few days if she remains confused.  Thankfully most recent LTM EEG from 6/10-6/11 without any seizures.  Rectus muscle hematoma/pelvic hematoma/possible hemoperitoneum with acute blood loss anemia Resolved-benign abdominal exam  Hb now stable after multiple units of PRBC transfusion Follow CBC  History of ICH Stable-supportive care-nonfocal exam  History of CLL In remission-WBC remains stable  Hx of ITP Continue Promacta Platelet count stable Appreciate hematology input.  HTN BP stable Coreg/hydralazine  HLD Statin  Acute urine retention UTI Foley removed 6/7-voiding spontaneously. Completed Rocephin x 3 days for presumed UTI.  Mood disorder Zoloft  Nutrition Status: Nutrition Problem: Moderate Malnutrition Etiology: chronic illness Signs/Symptoms: mild fat depletion, moderate muscle depletion Interventions: Magic cup  BMI: Estimated body mass index is 23.54 kg/m as calculated from  the following:   Height as of 05/29/21: 4\' 10"  (1.473 m).   Weight as of this encounter: 51.1 kg.   Code status:   Code Status:  DNR   DVT Prophylaxis: Place and maintain sequential compression device Start: 02/14/23 0709 SCDs Start: 02/04/23 1729.  Avoiding pharmacological agents given rectus sheath hematoma/ABLA.   Family Communication: Daughter and spouse at bedside   Disposition Plan: Status is: Inpatient Remains inpatient appropriate because: Severity of illness   Planned Discharge Destination:Skilled nursing facility   Diet: Diet Order             Diet regular Room service appropriate? No; Fluid consistency: Thin  Diet effective now                     Antimicrobial agents: Anti-infectives (From admission, onward)    Start     Dose/Rate Route Frequency Ordered Stop   02/08/23 0600  cefTRIAXone (ROCEPHIN) 1 g in sodium chloride 0.9 % 100 mL IVPB        1 g 200 mL/hr over 30 Minutes Intravenous Every 24 hours 02/07/23 0600 02/09/23 0547   02/07/23 0700  cefTRIAXone (ROCEPHIN) 1 g in sodium chloride 0.9 % 100 mL IVPB        1 g 200 mL/hr over 30 Minutes Intravenous  Once 02/07/23 0601 02/07/23 0645        MEDICATIONS: Scheduled Meds:  amLODipine  10 mg Oral QHS   atorvastatin  40 mg Per Tube Daily   carvedilol  3.125 mg Oral BID WC   chlorhexidine gluconate (MEDLINE KIT)  15 mL Mouth Rinse BID   Chlorhexidine Gluconate Cloth  6 each Topical Daily   dorzolamide-timolol  1 drop Both Eyes Daily   eltrombopag  25 mg Oral Q M,W,F   hydrALAZINE  25 mg Oral Q8H   lacosamide  200 mg Oral BID   latanoprost  1 drop Both Eyes QHS   melatonin  5 mg Oral QHS   multivitamin with minerals  1 tablet Per Tube Daily   pantoprazole  40 mg Oral BID   QUEtiapine  25 mg Oral QHS   silver sulfADIAZINE   Topical BID   sodium chloride flush  3 mL Intravenous Q12H   Continuous Infusions:   PRN Meds:.acetaminophen **OR** acetaminophen, haloperidol lactate **OR** haloperidol lactate, hydrALAZINE, midazolam, ondansetron **OR** ondansetron (ZOFRAN) IV, polyethylene glycol, sorbitol   I have personally  reviewed following labs and imaging studies  LABORATORY DATA: CBC: Recent Labs  Lab 02/18/23 0539 02/22/23 0334  WBC 11.3* 9.1  HGB 12.3 12.0  HCT 37.5 36.7  MCV 90.6 94.1  PLT 266 288     Basic Metabolic Panel: Recent Labs  Lab 02/18/23 0539 02/19/23 0122 02/20/23 0529 02/22/23 0334  NA 138 137 137 140  K 3.6 3.9 3.8 4.2  CL 105 105 104 107  CO2 21* 21* 24 25  GLUCOSE 110* 99 121* 109*  BUN 17 23 22  31*  CREATININE 0.61 0.72 0.70 0.98  CALCIUM 8.4* 8.3* 9.0 9.0  MG 2.1  --   --   --   PHOS 3.0  --   --   --      GFR: CrCl cannot be calculated (Unknown ideal weight.).  Liver Function Tests: No results for input(s): "AST", "ALT", "ALKPHOS", "BILITOT", "PROT", "ALBUMIN" in the last 168 hours.  No results for input(s): "LIPASE", "AMYLASE" in the last 168 hours. Recent Labs  Lab 02/21/23 1301 02/22/23 0812  AMMONIA 65* 41*  Coagulation Profile: No results for input(s): "INR", "PROTIME" in the last 168 hours.  Cardiac Enzymes: No results for input(s): "CKTOTAL", "CKMB", "CKMBINDEX", "TROPONINI" in the last 168 hours.  BNP (last 3 results) No results for input(s): "PROBNP" in the last 8760 hours.  Lipid Profile: No results for input(s): "CHOL", "HDL", "LDLCALC", "TRIG", "CHOLHDL", "LDLDIRECT" in the last 72 hours.  Thyroid Function Tests: No results for input(s): "TSH", "T4TOTAL", "FREET4", "T3FREE", "THYROIDAB" in the last 72 hours.  Anemia Panel: No results for input(s): "VITAMINB12", "FOLATE", "FERRITIN", "TIBC", "IRON", "RETICCTPCT" in the last 72 hours.  Urine analysis:    Component Value Date/Time   COLORURINE YELLOW 02/04/2023 1723   APPEARANCEUR HAZY (A) 02/04/2023 1723   LABSPEC 1.013 02/04/2023 1723   PHURINE 6.0 02/04/2023 1723   GLUCOSEU NEGATIVE 02/04/2023 1723   HGBUR LARGE (A) 02/04/2023 1723   BILIRUBINUR NEGATIVE 02/04/2023 1723   KETONESUR NEGATIVE 02/04/2023 1723   PROTEINUR NEGATIVE 02/04/2023 1723   UROBILINOGEN 0.2  05/06/2010 1611   NITRITE NEGATIVE 02/04/2023 1723   LEUKOCYTESUR TRACE (A) 02/04/2023 1723    Sepsis Labs: Lactic Acid, Venous    Component Value Date/Time   LATICACIDVEN 0.8 09/17/2019 0131    MICROBIOLOGY: No results found for this or any previous visit (from the past 240 hour(s)).  RADIOLOGY STUDIES/RESULTS: No results found.   LOS: 19 days   Jeoffrey Massed, MD  [facility]    To contact the attending provider between 7A-7P or the covering provider during after hours 7P-7A, please log into the web site [email or web address] and access using universal [place] password for that web site. If you do not have the password, please call the hospital operator.  02/24/2023, 11:29 AM

## 2023-02-24 NOTE — Progress Notes (Signed)
IP rehab admissions - I faxed a request to insurance carrier for potential acute inpatient rehab admission.  Will follow up after we hear back from insurance carrier.  Call for questions.  [phone]

## 2023-02-25 DIAGNOSIS — I10 Essential (primary) hypertension: Secondary | ICD-10-CM | POA: Diagnosis not present

## 2023-02-25 DIAGNOSIS — R569 Unspecified convulsions: Secondary | ICD-10-CM | POA: Diagnosis not present

## 2023-02-25 DIAGNOSIS — S301XXS Contusion of abdominal wall, sequela: Secondary | ICD-10-CM | POA: Diagnosis not present

## 2023-02-25 DIAGNOSIS — D696 Thrombocytopenia, unspecified: Secondary | ICD-10-CM | POA: Diagnosis not present

## 2023-02-25 LAB — CBC
HCT: 40.3 % (ref 36.0–46.0)
Hemoglobin: 13.4 g/dL (ref 12.0–15.0)
MCH: 29.9 pg (ref 26.0–34.0)
MCHC: 33.3 g/dL (ref 30.0–36.0)
MCV: 90 fL (ref 80.0–100.0)
Platelets: 318 10*3/uL (ref 150–400)
RBC: 4.48 MIL/uL (ref 3.87–5.11)
RDW: 15.2 % (ref 11.5–15.5)
WBC: 8.8 10*3/uL (ref 4.0–10.5)
nRBC: 0 % (ref 0.0–0.2)

## 2023-02-25 LAB — COMPREHENSIVE METABOLIC PANEL
ALT: 21 U/L (ref 0–44)
AST: 34 U/L (ref 15–41)
Albumin: 3.2 g/dL — ABNORMAL LOW (ref 3.5–5.0)
Alkaline Phosphatase: 79 U/L (ref 38–126)
Anion gap: 9 (ref 5–15)
BUN: 18 mg/dL (ref 8–23)
CO2: 24 mmol/L (ref 22–32)
Calcium: 8.6 mg/dL — ABNORMAL LOW (ref 8.9–10.3)
Chloride: 104 mmol/L (ref 98–111)
Creatinine, Ser: 0.76 mg/dL (ref 0.44–1.00)
GFR, Estimated: >60 mL/min (ref >60)
Glucose, Bld: 111 mg/dL — ABNORMAL HIGH (ref 70–99)
Potassium: 3.5 mmol/L (ref 3.5–5.1)
Sodium: 137 mmol/L (ref 135–145)
Total Bilirubin: 1 mg/dL (ref 0.3–1.2)
Total Protein: 6.1 g/dL — ABNORMAL LOW (ref 6.5–8.1)

## 2023-02-25 LAB — MAGNESIUM: Magnesium: 2.1 mg/dL (ref 1.7–2.4)

## 2023-02-25 LAB — AMMONIA: Ammonia: 25 umol/L (ref 9–35)

## 2023-02-25 MED ORDER — QUETIAPINE FUMARATE 25 MG PO TABS
25.0000 mg | ORAL_TABLET | Freq: Every day | ORAL | Status: DC | PRN
  Administered 2023-02-25 – 2023-02-27 (×2): 25 mg via ORAL
  Filled 2023-02-25 (×3): qty 1

## 2023-02-25 MED ORDER — ENOXAPARIN SODIUM 40 MG/0.4ML IJ SOSY
40.0000 mg | PREFILLED_SYRINGE | INTRAMUSCULAR | Status: DC
  Administered 2023-02-25 – 2023-02-27 (×3): 40 mg via SUBCUTANEOUS
  Filled 2023-02-25 (×3): qty 0.4

## 2023-02-25 NOTE — TOC Progression Note (Addendum)
Transition of Care [facility]) - Progression Note    Patient Details  Name: VISMAYA TRIMBOLI MRN: [id number] Date of Birth: 05/12/1941  [facility] CM/SW Contact  Mearl Latin, LCSW Phone Number: 02/25/2023, 10:30 AM  Clinical Narrative:    10:30am-CSW met with patient's family at bedside to discuss discharge plan and relayed concerns from Dr. Logan Bores. Family reports they are ok with going to SNF rehab with preference for [facility] instead of risking a shorter amount of time at CIR. CSW updated [employer] and started SNF and [place] authorization process with [employer].   4pm-CSW received insurance approval for [employer], Auth [id number] for 7 days, PTAR auth# [id number]. Per [employer], they can accept patient tomorrow after 1pm with no additional haldol needs. CSW updated patient's daughter and she is at [employer] signing paperwork now.    Expected Discharge Plan: Skilled Nursing Facility Barriers to Discharge: Insurance Authorization  Expected Discharge Plan and Services In-house Referral: Clinical Social Work   Post Acute Care Choice: Skilled Nursing Facility Living arrangements for the past 2 months: Single Family Home                                       Social Determinants of Health (SDOH) Interventions SDOH Screenings   Depression (PHQ2-9): Low Risk  (09/28/2019)  Tobacco Use: Low Risk  (02/04/2023)    Readmission Risk Interventions     No data to display

## 2023-02-25 NOTE — Progress Notes (Signed)
Ok to resume Lovenox 40mg  for DVT prophylaxis per Dr. Jerral Ralph.  Ulyses Southward, PharmD, BCIDP, AAHIVP, CPP Infectious Disease Pharmacist 02/25/2023 11:13 AM

## 2023-02-25 NOTE — Progress Notes (Deleted)
Pt has constantly tried to get out of bed, despite multiple staff members re-directing pt's behavior. When this Clinical research associate, primary RN Kearston Putman, came to pt's aide after bed alarm went off, pt was trying to get out of bed, and was not re-directable. This Clinical research associate administered PRN IV Haldol 2 mg at 1332, and pt's [name] was called and spoke to pt. Pt in NAD at this time, and this writer will continue to monitor pt.

## 2023-02-25 NOTE — Progress Notes (Signed)
Subjective: NAEO. Talking to family. However, per family still hallucinating and thinks she is pregnant. Saw a cat in her hospital room. Does answer questions appropriately.  ROS: negative except above  Examination  Vital signs in last 24 hours: Temp:  [96.8 F (36 C)-98.1 F (36.7 C)] 96.8 F (36 C) (06/14 0800) Pulse Rate:  [78-87] 84 (06/14 0759) Resp:  [16-19] 19 (06/14 0521) BP: (131-138)/(49-63) 137/49 (06/14 0800) SpO2:  [92 %-96 %] 92 % (06/14 0521)  General: sitting in bed, NAD Neuro: awake, told me her name and that she is at hospital but not oriented to time, follows commands, able to do simple math, able to tell me days of week, CN appear intact, spontaneously moving all extremities   Basic Metabolic Panel: Recent Labs  Lab 02/19/23 0122 02/20/23 0529 02/22/23 0334 02/25/23 0304  NA 137 137 140 137  K 3.9 3.8 4.2 3.5  CL 105 104 107 104  CO2 21* 24 25 24   GLUCOSE 99 121* 109* 111*  BUN 23 22 31* 18  CREATININE 0.72 0.70 0.98 0.76  CALCIUM 8.3* 9.0 9.0 8.6*  MG  --   --   --  2.1    CBC: Recent Labs  Lab 02/22/23 0334 02/25/23 0304  WBC 9.1 8.8  HGB 12.0 13.4  HCT 36.7 40.3  MCV 94.1 90.0  PLT 288 318     Coagulation Studies: No results for input(s): "LABPROT", "INR" in the last 72 hours.  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     Focal electrographic status epilepticus, resolved ICH, POA Acute encephalopathy, stable - Seizures most likely secondary to underlying ICH - Status epilepticus has resolved.  EEG was also improved.  However patient continues to be encephalopathic.  This is most likely secondary to delirium   Recommendations - Continue Vimpat 200mg  BID -  Continue seizure precautions, delirium precautions - As needed IV Versed for clinical seizures lasting more than 2 minutes - Discussed plan with Dr. Jerral Ralph and family - F/u with Dr Teresa Coombs at [facility] neuro in 4-8 weeks  Seizure precautions: Per [facility] statutes, patients with seizures are not allowed to drive until they have been seizure-free for six months and cleared by a physician    Use caution when using heavy equipment or power tools. Avoid working on ladders or at heights. Take showers instead of baths. Ensure the water temperature is not too high on the home water heater. Do not go swimming alone. Do not lock yourself in a room alone (i.e. bathroom). When caring for infants or small children, sit down when holding, feeding, or changing them to minimize risk of injury to the child in the event you have a seizure. Maintain good sleep hygiene. Avoid alcohol.    If patient has another seizure, call 911 and bring them back to the ED if: A.  The seizure lasts longer than 5 minutes.      B.  The patient doesn't wake shortly after the seizure or has new problems such as difficulty seeing, speaking or moving following the seizure C.  The patient was injured during the seizure D.  The patient has a temperature over 102 F (39C) E.  The patient vomited during the seizure and now is  having trouble breathing    During the Seizure   - First, ensure adequate ventilation and place patients on the floor on their left side  Loosen clothing around the neck and ensure the airway is patent. If the patient is clenching the teeth, do not force the mouth open with any object as this can cause severe damage - Remove all items from the surrounding that can be hazardous. The patient may be oblivious to what's happening and may not even know what he or she is doing. If the patient is confused and wandering, either gently guide him/her away and block access to outside areas - Reassure the individual and  be comforting - Call 911. In most cases, the seizure ends before EMS arrives. However, there are cases when seizures may last over 3 to 5 minutes. Or the individual may have developed breathing difficulties or severe injuries. If a pregnant patient or a person with diabetes develops a seizure, it is prudent to call an ambulance. - Finally, if the patient does not regain full consciousness, then call EMS. Most patients will remain confused for about 45 to 90 minutes after a seizure, so you must use judgment in calling for help.    After the Seizure (Postictal Stage)   After a seizure, most patients experience confusion, fatigue, muscle pain and/or a headache. Thus, one should permit the individual to sleep. For the next few days, reassurance is essential. Being calm and helping reorient the person is also of importance.   Most seizures are painless and end spontaneously. Seizures are not harmful to others but can lead to complications such as stress on the lungs, brain and the heart. Individuals with prior lung problems may develop labored breathing and respiratory distress.      I have spent a total of  36  minutes with the patient reviewing hospital notes,  test results, labs and examining the patient as well as establishing an assessment and plan.  > 50% of time was spent in direct patient care.    Lindie Spruce Epilepsy [facility] For questions after 5pm please refer to AMION to reach the Neurologist on call

## 2023-02-25 NOTE — Progress Notes (Signed)
PROGRESS NOTE        PATIENT DETAILS Name: Selena Branch Age: 82 y.o. Sex: female Date of Birth: February 17, 1941 Admit Date: 02/04/2023 Admitting Physician Rodolph Bong, MD ZOX:WRUEAVW, Toniann Fail, MD  Brief Summary: Patient is a 82 y.o.  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  Unfortunately-further hospital course complicated by delirium, rectus muscle hematoma with acute blood loss anemia.    Significant studies: 5/24>> CT head: No acute abnormality. 5/24>> CT angio head/neck: No LVO/significant stenosis 5/24>> MRI brain: No acute intracranial process. 6/02>> CT abdomen/pelvis: Pelvic hematoma, intramuscular hemorrhage of right lower rectus muscle, hemoperitoneum suspected. 6/10-6/11>> LTM EEG: No seizures.  Significant microbiology data: None  Procedures: 5/24-5/25>> LTM EEG: Status epilepticus-left hemisphere-left parietal region 6/02>> Spot EEG: No definite seizures. 6/02-6/03>> LTM EEG: No seizures  Consults: Neurology  Subjective: Much more awake/alert-multiple family members at bedside.  Although much better-per family still hallucinating at times and not yet at baseline.  Objective: Vitals: Blood pressure (!) 137/49, pulse 84, temperature (!) 96.8 F (36 C), resp. rate 19, weight 51.1 kg, SpO2 92 %.   Exam: Gen Exam:Alert awake-not in any distress HEENT:atraumatic, normocephalic Chest: B/L clear to auscultation anteriorly CVS:S1S2 regular Abdomen:soft non tender, non distended Extremities:no edema Neurology: Non focal Skin: no rash  Pertinent Labs/Radiology:    Latest Ref Rng & Units 02/25/2023    3:04 AM 02/22/2023    3:34 AM 02/18/2023    5:39 AM  CBC  WBC 4.0 - 10.5 K/uL 8.8  9.1  11.3   Hemoglobin 12.0 - 15.0 g/dL 09.8  11.9  14.7   Hematocrit 36.0 - 46.0 % 40.3  36.7  37.5   Platelets 150 - 400 K/uL 318  288  266      Lab Results  Component Value Date   NA 137 02/25/2023   K 3.5 02/25/2023   CL 104 02/25/2023   CO2 24 02/25/2023      Assessment/Plan: Acute metabolic encephalopathy/expressive aphasia secondary to status epilepticus Status epilepticus-most repeat LTM EEG on 6/10-6/11 negative Continue Vimpat Was also on Keppra that was discontinued in favor of Depakote-which was in turn discontinued on 6/12 due to excessive drowsiness.    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   This a.m. she is remarkably better-numerous family members at bedside.  Discussed with Dr. Alonza Smoker clinically improved-no plans to pursue MRI at this point-plan is to continue supportive care-and see if we can get her to a SNF-and repeat MRI in 3-4 weeks at follow-up with neurology.  Rectus muscle hematoma/pelvic hematoma/possible hemoperitoneum with acute blood loss anemia Resolved-benign abdominal exam  Hb now stable after multiple units of PRBC transfusion Follow CBC  History of ICH Stable-supportive care-nonfocal exam  History of CLL In remission-WBC remains stable  Hx of ITP Continue Promacta Platelet count stable Appreciate hematology input.  HTN BP stable Coreg/hydralazine  HLD Statin  Acute urine retention UTI Foley removed 6/7-voiding spontaneously. Completed Rocephin x 3 days for presumed UTI.  Mood disorder Zoloft  Nutrition Status: Nutrition Problem: Moderate Malnutrition Etiology: chronic illness Signs/Symptoms: mild fat depletion, moderate muscle depletion Interventions:  Magic cup  BMI: Estimated body mass index is 23.54 kg/m as calculated from the following:   Height as of 05/29/21: 4\' 10"  (1.473 m).   Weight as of this encounter: 51.1  kg.   Code status:   Code Status: DNR   DVT Prophylaxis: enoxaparin (LOVENOX) injection 40 mg Start: 02/25/23 1200 Place and maintain sequential compression device Start: 02/14/23 0709 SCDs Start: 02/04/23 1729.  Initially all pharmaceutical agents were held due to ABLA/rectus sheath hematoma-resuming Lovenox starting 6/14.  Monitor closely.   Family Communication: Daughter and spouse at bedside   Disposition Plan: Status is: Inpatient Remains inpatient appropriate because: Severity of illness   Planned Discharge Destination:Skilled nursing facility.  Awaiting SNF bed-medically stable for discharge once bed available.   Diet: Diet Order             Diet regular Room service appropriate? No; Fluid consistency: Thin  Diet effective now                     Antimicrobial agents: Anti-infectives (From admission, onward)    Start     Dose/Rate Route Frequency Ordered Stop   02/08/23 0600  cefTRIAXone (ROCEPHIN) 1 g in sodium chloride 0.9 % 100 mL IVPB        1 g 200 mL/hr over 30 Minutes Intravenous Every 24 hours 02/07/23 0600 02/09/23 0547   02/07/23 0700  cefTRIAXone (ROCEPHIN) 1 g in sodium chloride 0.9 % 100 mL IVPB        1 g 200 mL/hr over 30 Minutes Intravenous  Once 02/07/23 0601 02/07/23 0645        MEDICATIONS: Scheduled Meds:  amLODipine  10 mg Oral QHS   atorvastatin  40 mg Per Tube Daily   carvedilol  3.125 mg Oral BID WC   chlorhexidine gluconate (MEDLINE KIT)  15 mL Mouth Rinse BID   Chlorhexidine Gluconate Cloth  6 each Topical Daily   dorzolamide-timolol  1 drop Both Eyes Daily   eltrombopag  25 mg Oral Q M,W,F   enoxaparin (LOVENOX) injection  40 mg Subcutaneous Q24H   hydrALAZINE  25 mg Oral Q8H   lacosamide  200 mg Oral BID   latanoprost  1 drop Both Eyes QHS   melatonin  5 mg Oral QHS   multivitamin with minerals  1 tablet Per Tube Daily   pantoprazole  40 mg Oral BID   QUEtiapine  25 mg Oral QHS   silver sulfADIAZINE   Topical BID    sodium chloride flush  3 mL Intravenous Q12H   Continuous Infusions:   PRN Meds:.acetaminophen **OR** acetaminophen, haloperidol lactate **OR** haloperidol lactate, hydrALAZINE, midazolam, ondansetron **OR** ondansetron (ZOFRAN) IV, polyethylene glycol, sorbitol   I have personally reviewed following labs and imaging studies  LABORATORY DATA: CBC: Recent Labs  Lab 02/22/23 0334 02/25/23 0304  WBC 9.1 8.8  HGB 12.0 13.4  HCT 36.7 40.3  MCV 94.1 90.0  PLT 288 318     Basic Metabolic Panel: Recent Labs  Lab 02/19/23 0122 02/20/23 0529 02/22/23 0334 02/25/23 0304  NA 137 137 140 137  K 3.9 3.8 4.2 3.5  CL 105 104 107 104  CO2 21* 24 25 24   GLUCOSE 99 121* 109* 111*  BUN 23 22 31* 18  CREATININE 0.72 0.70 0.98 0.76  CALCIUM 8.3* 9.0 9.0 8.6*  MG  --   --   --  2.1     GFR: CrCl cannot be calculated (Unknown ideal weight.).  Liver Function  Tests: Recent Labs  Lab 02/25/23 0304  AST 34  ALT 21  ALKPHOS 79  BILITOT 1.0  PROT 6.1*  ALBUMIN 3.2*    No results for input(s): "LIPASE", "AMYLASE" in the last 168 hours. Recent Labs  Lab 02/21/23 1301 02/22/23 0812 02/25/23 0304  AMMONIA 65* 41* 25     Coagulation Profile: No results for input(s): "INR", "PROTIME" in the last 168 hours.  Cardiac Enzymes: No results for input(s): "CKTOTAL", "CKMB", "CKMBINDEX", "TROPONINI" in the last 168 hours.  BNP (last 3 results) No results for input(s): "PROBNP" in the last 8760 hours.  Lipid Profile: No results for input(s): "CHOL", "HDL", "LDLCALC", "TRIG", "CHOLHDL", "LDLDIRECT" in the last 72 hours.  Thyroid Function Tests: No results for input(s): "TSH", "T4TOTAL", "FREET4", "T3FREE", "THYROIDAB" in the last 72 hours.  Anemia Panel: No results for input(s): "VITAMINB12", "FOLATE", "FERRITIN", "TIBC", "IRON", "RETICCTPCT" in the last 72 hours.  Urine analysis:    Component Value Date/Time   COLORURINE YELLOW 02/04/2023 1723   APPEARANCEUR HAZY (A)  02/04/2023 1723   LABSPEC 1.013 02/04/2023 1723   PHURINE 6.0 02/04/2023 1723   GLUCOSEU NEGATIVE 02/04/2023 1723   HGBUR LARGE (A) 02/04/2023 1723   BILIRUBINUR NEGATIVE 02/04/2023 1723   KETONESUR NEGATIVE 02/04/2023 1723   PROTEINUR NEGATIVE 02/04/2023 1723   UROBILINOGEN 0.2 05/06/2010 1611   NITRITE NEGATIVE 02/04/2023 1723   LEUKOCYTESUR TRACE (A) 02/04/2023 1723    Sepsis Labs: Lactic Acid, Venous    Component Value Date/Time   LATICACIDVEN 0.8 09/17/2019 0131    MICROBIOLOGY: No results found for this or any previous visit (from the past 240 hour(s)).  RADIOLOGY STUDIES/RESULTS: No results found.   LOS: 20 days   Jeoffrey Massed, MD  [facility]    To contact the attending provider between 7A-7P or the covering provider during after hours 7P-7A, please log into the web site [email or web address] and access using universal [place] password for that web site. If you do not have the password, please call the hospital operator.  02/25/2023, 11:27 AM

## 2023-02-25 NOTE — Progress Notes (Signed)
Inpatient Rehab Admissions Coordinator:   Note plans for SNF.  Will sign off for CIR at this time.   Estill Dooms, PT, DPT Admissions Coordinator [phone] 02/25/23  11:52 AM

## 2023-02-26 DIAGNOSIS — R569 Unspecified convulsions: Secondary | ICD-10-CM | POA: Diagnosis not present

## 2023-02-26 MED ORDER — POTASSIUM CHLORIDE 20 MEQ PO PACK
40.0000 meq | PACK | Freq: Once | ORAL | Status: AC
  Administered 2023-02-26: 40 meq via ORAL
  Filled 2023-02-26: qty 2

## 2023-02-26 NOTE — Progress Notes (Signed)
PROGRESS NOTE        PATIENT DETAILS Name: Selena Branch Age: 82 y.o. Sex: female Date of Birth: 12-12-1940 Admit Date: 02/04/2023 Admitting Physician Rodolph Bong, MD ZOX:WRUEAVW, Toniann Fail, MD  Brief Summary: Patient is a 82 y.o.  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  Unfortunately-further hospital course complicated by delirium, rectus muscle hematoma with acute blood loss anemia.    Significant studies: 5/24>> CT head: No acute abnormality. 5/24>> CT angio head/neck: No LVO/significant stenosis 5/24>> MRI brain: No acute intracranial process. 6/02>> CT abdomen/pelvis: Pelvic hematoma, intramuscular hemorrhage of right lower rectus muscle, hemoperitoneum suspected. 6/10-6/11>> LTM EEG: No seizures.  Significant microbiology data: None  Procedures: 5/24-5/25>> LTM EEG: Status epilepticus-left hemisphere-left parietal region 6/02>> Spot EEG: No definite seizures. 6/02-6/03>> LTM EEG: No seizures  Consults: Neurology  Subjective:  Patient in bed, appears comfortable, denies any headache, no fever, no chest pain or pressure, no shortness of breath , no abdominal pain. No new focal weakness.   Objective: Vitals: Blood pressure (!) 111/52, pulse 92, temperature 97.6 F (36.4 C), temperature source Oral, resp. rate 18, weight 51.2 kg, SpO2 98 %.   Exam:  Awake, minimally confused, No new F.N deficits,   [place].AT,PERRAL Supple Neck, No JVD,   Symmetrical Chest wall movement, Good air movement bilaterally, CTAB RRR,No Gallops, Rubs or new Murmurs,  +ve B.Sounds, Abd Soft, No tenderness,   No Cyanosis, Clubbing or edema    Assessment/Plan:  Acute metabolic encephalopathy/expressive aphasia secondary to status epilepticus Status epilepticus-most repeat LTM EEG on 6/10-6/11 negative Continue Vimpat Was also on Keppra that was  discontinued in favor of Depakote-which was in turn discontinued on 6/12 due to excessive drowsiness.    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   This a.m. she is remarkably better-numerous family members at bedside.  Discussed with Dr. Alonza Smoker clinically improved-no plans to pursue MRI at this point-plan is to continue supportive care-and see if we can get her to a SNF-and repeat MRI in 3-4 weeks at follow-up with neurology.  Rectus muscle hematoma/pelvic hematoma/possible hemoperitoneum with acute blood loss anemia Resolved-benign abdominal exam  Hb now stable after multiple units of PRBC transfusion Follow CBC  History of ICH Stable-supportive care-nonfocal exam  History of CLL In remission-WBC remains stable  Hx of ITP Continue Promacta Platelet count stable Appreciate hematology input.  HTN BP stable Coreg/hydralazine  HLD Statin  Acute urine retention UTI Foley removed 6/7-voiding spontaneously. Completed Rocephin x 3 days for presumed UTI.  Mood disorder Zoloft  Nutrition Status: Nutrition Problem: Moderate Malnutrition Etiology: chronic illness Signs/Symptoms: mild fat depletion, moderate muscle depletion Interventions: Magic cup  BMI: Estimated body mass index is 23.59 kg/m as calculated from the following:   Height as of 05/29/21: 4\' 10"  (1.473 m).   Weight as of this encounter: 51.2 kg.   Code status:   Code Status: DNR   DVT Prophylaxis: enoxaparin (LOVENOX) injection 40 mg Start: 02/25/23 1200 Place and maintain sequential compression device Start: 02/14/23 0709 SCDs Start: 02/04/23 1729.  Initially all pharmaceutical agents were held due to ABLA/rectus sheath hematoma-resuming Lovenox  starting 6/14.   Monitor closely.   Family Communication: Daughter and spouse at bedside   Disposition Plan: Status is: Inpatient Remains inpatient appropriate because: Severity of illness   Planned Discharge Destination:Skilled nursing facility.  Awaiting SNF bed-medically stable for discharge once bed available.   Diet: Diet Order             Diet regular Room service appropriate? No; Fluid consistency: Thin  Diet effective now                  MEDICATIONS: Scheduled Meds:  amLODipine  10 mg Oral QHS   atorvastatin  40 mg Per Tube Daily   carvedilol  3.125 mg Oral BID WC   chlorhexidine gluconate (MEDLINE KIT)  15 mL Mouth Rinse BID   Chlorhexidine Gluconate Cloth  6 each Topical Daily   dorzolamide-timolol  1 drop Both Eyes Daily   eltrombopag  25 mg Oral Q M,W,F   enoxaparin (LOVENOX) injection  40 mg Subcutaneous Q24H   hydrALAZINE  25 mg Oral Q8H   lacosamide  200 mg Oral BID   latanoprost  1 drop Both Eyes QHS   melatonin  5 mg Oral QHS   multivitamin with minerals  1 tablet Per Tube Daily   pantoprazole  40 mg Oral BID   QUEtiapine  25 mg Oral QHS   silver sulfADIAZINE   Topical BID   sodium chloride flush  3 mL Intravenous Q12H   Continuous Infusions:   PRN Meds:.acetaminophen **OR** acetaminophen, hydrALAZINE, midazolam, ondansetron **OR** ondansetron (ZOFRAN) IV, polyethylene glycol, QUEtiapine, sorbitol   I have personally reviewed following labs and imaging studies  LABORATORY DATA:  Recent Labs  Lab 02/22/23 0334 02/25/23 0304  WBC 9.1 8.8  HGB 12.0 13.4  HCT 36.7 40.3  PLT 288 318  MCV 94.1 90.0  MCH 30.8 29.9  MCHC 32.7 33.3  RDW 16.3* 15.2    Recent Labs  Lab 02/20/23 0529 02/21/23 1301 02/22/23 0334 02/22/23 0812 02/25/23 0304  NA 137  --  140  --  137  K 3.8  --  4.2  --  3.5  CL 104  --  107  --  104  CO2 24  --  25  --  24  ANIONGAP 9  --  8  --  9  GLUCOSE 121*  --  109*  --  111*  BUN 22  --  31*  --  18  CREATININE 0.70  --  0.98   --  0.76  AST  --   --   --   --  34  ALT  --   --   --   --  21  ALKPHOS  --   --   --   --  79  BILITOT  --   --   --   --  1.0  ALBUMIN  --   --   --   --  3.2*  AMMONIA  --  65*  --  41* 25  MG  --   --   --   --  2.1  CALCIUM 9.0  --  9.0  --  8.6*   No results for input(s): "LIPASE", "AMYLASE" in the last 168 hours. Recent Labs  Lab 02/21/23 1301 02/22/23 0812 02/25/23 0304  AMMONIA 65* 41* 25     MICROBIOLOGY: No results found for this or any previous visit (from the past 240 hour(s)).  RADIOLOGY STUDIES/RESULTS: No results found.   LOS: 21  days   Signature  -    Susa Raring M.D on 02/26/2023 at 10:00 AM   -  To page go to [email or web address]

## 2023-02-27 DIAGNOSIS — R569 Unspecified convulsions: Secondary | ICD-10-CM | POA: Diagnosis not present

## 2023-02-27 LAB — BASIC METABOLIC PANEL
Anion gap: 12 (ref 5–15)
BUN: 23 mg/dL (ref 8–23)
CO2: 21 mmol/L — ABNORMAL LOW (ref 22–32)
Calcium: 8.5 mg/dL — ABNORMAL LOW (ref 8.9–10.3)
Chloride: 106 mmol/L (ref 98–111)
Creatinine, Ser: 0.83 mg/dL (ref 0.44–1.00)
GFR, Estimated: >60 mL/min (ref >60)
Glucose, Bld: 123 mg/dL — ABNORMAL HIGH (ref 70–99)
Potassium: 3.9 mmol/L (ref 3.5–5.1)
Sodium: 139 mmol/L (ref 135–145)

## 2023-02-27 LAB — CBC WITH DIFFERENTIAL/PLATELET
Abs Immature Granulocytes: 0.02 10*3/uL (ref 0.00–0.07)
Basophils Absolute: 0 10*3/uL (ref 0.0–0.1)
Basophils Relative: 0 %
Eosinophils Absolute: 0.2 10*3/uL (ref 0.0–0.5)
Eosinophils Relative: 2 %
HCT: 39.2 % (ref 36.0–46.0)
Hemoglobin: 13.2 g/dL (ref 12.0–15.0)
Immature Granulocytes: 0 %
Lymphocytes Relative: 24 %
Lymphs Abs: 2.1 10*3/uL (ref 0.7–4.0)
MCH: 30.6 pg (ref 26.0–34.0)
MCHC: 33.7 g/dL (ref 30.0–36.0)
MCV: 90.7 fL (ref 80.0–100.0)
Monocytes Absolute: 1 10*3/uL (ref 0.1–1.0)
Monocytes Relative: 11 %
Neutro Abs: 5.4 10*3/uL (ref 1.7–7.7)
Neutrophils Relative %: 63 %
Platelets: 307 10*3/uL (ref 150–400)
RBC: 4.32 MIL/uL (ref 3.87–5.11)
RDW: 15.5 % (ref 11.5–15.5)
WBC: 8.7 10*3/uL (ref 4.0–10.5)
nRBC: 0 % (ref 0.0–0.2)

## 2023-02-27 LAB — MAGNESIUM: Magnesium: 2.3 mg/dL (ref 1.7–2.4)

## 2023-02-27 LAB — BRAIN NATRIURETIC PEPTIDE: B Natriuretic Peptide: 25.4 pg/mL (ref 0.0–100.0)

## 2023-02-27 LAB — PHOSPHORUS: Phosphorus: 3.5 mg/dL (ref 2.5–4.6)

## 2023-02-27 MED ORDER — CARVEDILOL 3.125 MG PO TABS: 3.1250 mg | ORAL_TABLET | Freq: Two times a day (BID) | ORAL | Status: DC

## 2023-02-27 MED ORDER — LORAZEPAM 1 MG PO TABS: 1.0000 mg | ORAL_TABLET | Freq: Three times a day (TID) | ORAL | 0 refills | Status: DC | PRN

## 2023-02-27 MED ORDER — PANTOPRAZOLE SODIUM 40 MG PO TBEC: 40.0000 mg | DELAYED_RELEASE_TABLET | Freq: Two times a day (BID) | ORAL | Status: DC

## 2023-02-27 MED ORDER — LACOSAMIDE 200 MG PO TABS: 200.0000 mg | ORAL_TABLET | Freq: Two times a day (BID) | ORAL | 0 refills | Status: DC

## 2023-02-27 MED ORDER — POLYETHYLENE GLYCOL 3350 17 G PO PACK: 17.0000 g | PACK | Freq: Every day | ORAL | 0 refills | Status: DC | PRN

## 2023-02-27 MED ORDER — QUETIAPINE FUMARATE 25 MG PO TABS: 25.0000 mg | ORAL_TABLET | Freq: Every day | ORAL | Status: DC

## 2023-02-27 NOTE — Discharge Instructions (Signed)
Follow with Primary MD Gweneth Dimitri, MD in 7 days   Get CBC, CMP, 2 view Chest X ray -  checked next visit with your primary MD or SNF MD    Activity: As tolerated with Full fall precautions use walker/cane & assistance as needed  Disposition SNF  Diet: Heart Healthy  with feeding assistance and aspiration precautions.  Special Instructions: If you have smoked or chewed Tobacco  in the last 2 yrs please stop smoking, stop any regular Alcohol  and or any Recreational drug use.  On your next visit with your primary care physician please Get Medicines reviewed and adjusted.  Please request your Prim.MD to go over all Hospital Tests and Procedure/Radiological results at the follow up, please get all Hospital records sent to your Prim MD by signing hospital release before you go home.  If you experience worsening of your admission symptoms, develop shortness of breath, life threatening emergency, suicidal or homicidal thoughts you must seek medical attention immediately by calling 911 or calling your MD immediately  if symptoms less severe.  You Must read complete instructions/literature along with all the possible adverse reactions/side effects for all the Medicines you take and that have been prescribed to you. Take any new Medicines after you have completely understood and accpet all the possible adverse reactions/side effects.

## 2023-02-27 NOTE — TOC Progression Note (Signed)
Transition of Care [facility]) - Progression Note    Patient Details  Name: TUNDRA WALDOCH MRN: [id number] Date of Birth: October 26, 1940  [facility] of [facility]) CM/SW Contact  Jimmy Picket, [place] Phone Number: 02/27/2023, 2:30 PM  Clinical Narrative:     CSW spoke [facility], they will be able to take patient for SNF tomorrow morning.   Expected Discharge Plan: Skilled Nursing Facility Barriers to Discharge: Insurance Authorization  Expected Discharge Plan and Services In-house Referral: Clinical Social Work   Post Acute Care Choice: Skilled Nursing Facility Living arrangements for the past 2 months: Single Family Home Expected Discharge Date: 02/27/23                                     Social Determinants of Health (SDOH) Interventions SDOH Screenings   Depression (PHQ2-9): Low Risk  (09/28/2019)  Tobacco Use: Low Risk  (02/04/2023)    Readmission Risk Interventions     No data to display         Jimmy Picket, LCSW Clinical Social Worker

## 2023-02-27 NOTE — Discharge Summary (Signed)
Selena Branch ZOX:[id number] DOB: 10-Jun-1941 DOA: 02/04/2023  PCP: Gweneth Dimitri, MD  Admit date: 02/04/2023  Discharge date: 02/28/2023  Admitted From: Home   Disposition:  SNF   Recommendations for Outpatient Follow-up:   Follow up with PCP in 1-2 weeks  PCP Please obtain BMP/CBC, 2 view CXR in 1week,  (see Discharge instructions)   PCP Please follow up on the following pending results:    Home Health: None   Equipment/Devices: None  Consultations: Neuro Discharge Condition: Stable    CODE STATUS: Full    Diet Recommendation: Heart Healthy      Chief Complaint  Patient presents with   Code Stroke     Brief history of present illness from the day of admission and additional interim summary    82 y.o.  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  Unfortunately-further hospital course complicated by delirium, rectus muscle hematoma with acute blood loss anemia.     Significant studies: 5/24>> CT head: No acute abnormality. 5/24>> CT angio head/neck: No LVO/significant stenosis 5/24>> MRI brain: No acute intracranial process. 6/02>> CT abdomen/pelvis: Pelvic hematoma, intramuscular hemorrhage of right lower rectus muscle, hemoperitoneum suspected. 6/10-6/11>> LTM EEG: No seizures.   Significant microbiology data: None   Procedures: 5/24-5/25>> LTM EEG: Status epilepticus-left hemisphere-left parietal region 6/02>> Spot EEG: No definite seizures. 6/02-6/03>> LTM EEG: No seizures                                                                 Hospital Course   Acute metabolic encephalopathy/expressive aphasia secondary to status epilepticus Status epilepticus-most repeat LTM EEG on 6/10-6/11  negative Continue Vimpat Now stable on this regimen per neurology.      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  Seroquel dose adjusted further for better control on 02/28/2023. This a.m. she is remarkably better-numerous family members at bedside.  Discussed with Dr. Alonza Smoker clinically improved-no plans to pursue MRI at this point-plan is to continue supportive care-and see if we can get her to a SNF-and repeat MRI in 3-4 weeks at follow-up with neurology.   Rectus muscle hematoma/pelvic hematoma/possible hemoperitoneum with acute blood loss anemia Resolved-benign abdominal exam  Hb now stable after multiple units of PRBC transfusion Follow CBC PPI   History of ICH Stable-supportive care-nonfocal exam   History of CLL In remission-WBC remains stable   Hx of ITP Continue Promacta Platelet count stable Appreciate hematology input.   HTN BP stable Coreg    HLD Statin   Acute urine retention UTI Foley removed 6/7-voiding  spontaneously. Completed Rocephin x 3 days for presumed UTI. Stable     Discharge diagnosis     Principal Problem:   Seizure (HCC): Unwitnessed Active Problems:   Aphasia   CLL (chronic lymphocytic leukemia) (HCC)   Thrombocytopenia (HCC)   Anemia   Essential hypertension   Dyslipidemia   Intracerebral hemorrhage   Primary open angle glaucoma of right eye   Malnutrition of moderate degree   Hematoma of rectus sheath    Discharge instructions    Discharge Instructions     Discharge instructions   Complete by: As directed    Follow with Primary MD Gweneth Dimitri, MD in 7 days   Get CBC, CMP, 2 view Chest X ray -  checked next visit with your primary MD or SNF MD    Activity: As tolerated with  Full fall precautions use walker/cane & assistance as needed  Disposition SNF  Diet: Heart Healthy  with feeding assistance and aspiration precautions.  Special Instructions: If you have smoked or chewed Tobacco  in the last 2 yrs please stop smoking, stop any regular Alcohol  and or any Recreational drug use.  On your next visit with your primary care physician please Get Medicines reviewed and adjusted.  Please request your Prim.MD to go over all Hospital Tests and Procedure/Radiological results at the follow up, please get all Hospital records sent to your Prim MD by signing hospital release before you go home.  If you experience worsening of your admission symptoms, develop shortness of breath, life threatening emergency, suicidal or homicidal thoughts you must seek medical attention immediately by calling 911 or calling your MD immediately  if symptoms less severe.  You Must read complete instructions/literature along with all the possible adverse reactions/side effects for all the Medicines you take and that have been prescribed to you. Take any new Medicines after you have completely understood and accpet all the possible adverse reactions/side effects.   Increase activity slowly   Complete by: As directed        Discharge Medications   Allergies as of 02/28/2023       Reactions   Codeine Nausea Only   Evista [raloxifene] Other (See Comments)   Leg cramps   Actonel [risedronate] Other (See Comments)   GI intolerance   Boniva [ibandronic Acid] Other (See Comments)   GI intolerance   Fosamax [alendronate] Other (See Comments)   GI intolerance   Prednisone Other (See Comments)   Aggression Irritability        Medication List     STOP taking these medications    furosemide 20 MG tablet Commonly known as: LASIX   lisinopril 40 MG tablet Commonly known as: ZESTRIL   sertraline 25 MG tablet Commonly known as: ZOLOFT       TAKE these medications     amLODipine 10 MG tablet Commonly known as: NORVASC Take 1 tablet (10 mg total) by mouth at bedtime. What changed: how much to take   atorvastatin 40 MG tablet Commonly known as: LIPITOR Take 40 mg by mouth daily.   CALCIUM + VITAMIN D3 PO Take 1 tablet by mouth daily.   carvedilol 3.125 MG tablet Commonly known as: COREG Take 1 tablet (3.125 mg total) by mouth 2 (two) times daily with a meal.   dorzolamide-timolol 2-0.5 % ophthalmic solution Commonly known as: COSOPT Place 1 drop into both eyes 2 (two) times daily.   eltrombopag 25 MG tablet Commonly known as: PROMACTA Take 1 tablet (25 mg) on  Monday Wednesday and Friday .take on an empty stomach, 1 hour before a meal or 2 hours after. What changed:  how much to take how to take this when to take this additional instructions   ibandronate 150 MG tablet Commonly known as: BONIVA Take 150 mg by mouth every 28 (twenty-eight) days.   lacosamide 200 MG Tabs tablet Commonly known as: VIMPAT Take 1 tablet (200 mg total) by mouth 2 (two) times daily.   latanoprost 0.005 % ophthalmic solution Commonly known as: XALATAN Place 1 drop into both eyes at bedtime.   LORazepam 1 MG tablet Commonly known as: Ativan Take 1 tablet (1 mg total) by mouth every 8 (eight) hours as needed for seizure.   pantoprazole 40 MG tablet Commonly known as: PROTONIX Take 1 tablet (40 mg total) by mouth 2 (two) times daily.   polyethylene glycol 17 g packet Commonly known as: MIRALAX / GLYCOLAX Place 17 g into feeding tube daily as needed for mild constipation.   QUEtiapine 50 MG tablet Commonly known as: SEROQUEL Take 1 tablet (50 mg total) by mouth at bedtime.   QUEtiapine 25 MG tablet Commonly known as: SEROQUEL Take 1 tablet (25 mg total) by mouth daily as needed (agitation).   Simbrinza 1-0.2 % Susp Generic drug: Brinzolamide-Brimonidine Place 1 drop into both eyes 2 (two) times daily.   Womens 50+ Multi Vitamin/Min Tabs Take  1 tablet by mouth daily.         Contact information for follow-up providers     Gweneth Dimitri, MD. Schedule an appointment as soon as possible for a visit in 1 week(s).   Specialty: Family Medicine Contact information: [address] [place] [address] [phone]         [facility]. Schedule an appointment as soon as possible for a visit in 1 week(s).   Contact information: [address]     [address] [place] [address] [phone]             Contact information for after-discharge care     Destination     [facility] Preferred SNF .   Service: Skilled Nursing Contact information: [address] [place] [address] [phone]                     Major procedures and Radiology Reports - PLEASE review detailed and final reports thoroughly  -      Overnight EEG with video  Result Date: 02/22/2023 Charlsie Quest, MD     02/22/2023 11:34 AM Patient Name: KEYSI SOLIDAY MRN: [id number] Epilepsy Attending: Charlsie Quest Referring Physician/Provider: Charlsie Quest, MD Duration: 02/21/2023 1428 to 02/22/2023 7169  Patient history: 82 y.o. female with a past medical history of CLL, ITP, left parietal ICH in 2021 presenting with aphasia and generalized weakness. EEG to evaluate for seizure.  Level of alertness: awake. asleep  AEDs during EEG study: LCM, VPA  Technical aspects: This EEG study was done with scalp electrodes positioned according to the 10-20 International system of electrode placement. Electrical activity was reviewed with band pass filter of 1-70Hz , sensitivity of 7 uV/mm, display speed of 31mm/sec with a 60Hz  notched filter applied as appropriate. EEG data were recorded continuously and digitally stored.  Video monitoring was available and reviewed as appropriate.  Description:  The posterior dominant rhythm consists of 8-9 Hz activity of moderate voltage (25-35 uV)  seen predominantly in posterior head regions, symmetric and reactive to  eye opening and eye closing.  Sleep was characterized by sleep spindles (12 to 14 Hz), maximal frontocentral region.  EEG showed continuous generalized and lateralized left hemisphere 5-9Hz  theta-alpha activity admixed with intermittent 2-3hz  delta slowing. Hyperventilation and photic stimulation were not performed.   ABNORMALITY - Continuous slow, generalized and lateralized left hemisphere  IMPRESSION: This study is suggestive of cortical dysfunction arising from left hemisphere likely secondary to underlying structural abnormality. Additionally there is moderate diffuse encephalopathy. No definite seizures were seen.  Charlsie Quest  DG Abd Portable 1V  Result Date: 02/16/2023 CLINICAL DATA:  Feeding tube placement. EXAM: PORTABLE ABDOMEN - 1 VIEW COMPARISON:  02/14/2023 FINDINGS: Feeding tube tip in the region of the gastric pylorus. Normal sized heart. Interval mild linear atelectasis in both lower lung zones. Lower thoracic spine degenerative changes. The included bowel-gas pattern is normal. IMPRESSION: Feeding tube tip in the region of the gastric pylorus. Electronically Signed   By: Beckie Salts M.D.   On: 02/16/2023 16:10   DG Abd Portable 1V  Result Date: 02/14/2023 CLINICAL DATA:  Feeding tube placement. EXAM: PORTABLE ABDOMEN - 1 VIEW COMPARISON:  Abdominopelvic CT yesterday FINDINGS: Tip of the weighted enteric tube is in the right upper quadrant, in the proximal duodenum on yesterday's CT. No dilatation of upper abdominal bowel loops. IMPRESSION: Tip of the weighted enteric tube in the proximal duodenum. Electronically Signed   By: Narda Rutherford M.D.   On: 02/14/2023 14:17   Overnight EEG with video  Result Date: 02/13/2023 Charlsie Quest, MD     02/14/2023 10:40 AM Patient Name: ANNALUCIA NACKE MRN: [id number] Epilepsy Attending: Charlsie Quest Referring Physician/Provider: Milon Dikes, MD Duration: 02/13/2023 0050  to 02/14/2023 0050  Patient history: 82 y.o. female with a past medical history of CLL, ITP, left parietal ICH in 2021 presenting with aphasia and generalized weakness. EEG to evaluate for seizure.  Level of alertness: awake. asleep  AEDs during EEG study: LEV, LCM  Technical aspects: This EEG study was done with scalp electrodes positioned according to the 10-20 International system of electrode placement. Electrical activity was reviewed with band pass filter of 1-70Hz , sensitivity of 7 uV/mm, display speed of 50mm/sec with a 60Hz  notched filter applied as appropriate. EEG data were recorded continuously and digitally stored.  Video monitoring was available and reviewed as appropriate.  Description:  No clear posterior dominant rhythm was seen. Sleep was characterized by sleep spindles (12 to 14 Hz), maximal frontocentral region.  EEG showed continuous generalized and lateralized left hemisphere 5-9Hz  theta-alpha activity admixed with intermittent 2-3hz  delta slowing. Sharp waves were also noted arising from left hemisphere.  Hyperventilation and photic stimulation were not performed.  ABNORMALITY - Sharp waves, left hemisphere - Continuous slow, generalized and lateralized left hemisphere  IMPRESSION: This study showed evidence of epileptogenicity and  cortical dysfunction arising from left hemisphere likely secondary to underlying structural abnormality. Additionally there is moderate diffuse encephalopathy. No definite seizures were seen.  Charlsie Quest   EEG adult  Result Date: 02/13/2023 Charlsie Quest, MD     02/14/2023 10:39 AM Patient Name: KASSANDRE WATTLEY MRN: [id number] Epilepsy Attending: Charlsie Quest Referring Physician/Provider: Caryl Pina, MD Duration: 31.32 mins  Patient history: 82 y.o. female with a past medical history of CLL, ITP, left parietal ICH in 2021 presenting with aphasia and generalized weakness. EEG to evaluate for seizure.  Level of alertness: awake  AEDs during EEG study:  LEV, LCM  Technical aspects: This EEG  study was done with scalp electrodes positioned according to the 10-20 International system of electrode placement. Electrical activity was reviewed with band pass filter of 1-70Hz , sensitivity of 7 uV/mm, display speed of 33mm/sec with a 60Hz  notched filter applied as appropriate. EEG data were recorded continuously and digitally stored.  Video monitoring was available and reviewed as appropriate.  Description:   No clear posterior dominant rhythm was seen. EEG showed continuous generalized and lateralized left hemisphere 3 to 6 Hz theta-delta slowing. Frequent sharp waves were also noted arising from left hemisphere.  Hyperventilation and photic stimulation were not performed.  Patient was noted to have upward gaze deviation with mouth opening and closing repetitively at 2309 on 02/12/2023. Concomitant eeg before, during and after the event didn't show any eeg change to suggest seizure ABNORMALITY - Sharp waves, left hemisphere - Continuous slow, generalized and lateralized left hemisphere  IMPRESSION: This study showed evidence of epileptogenicity and  cortical dysfunction arising from left hemisphere likely secondary to underlying structural abnormality. Additionally there is moderate diffuse encephalopathy. No definite seizures were seen. On 02/12/2023 at 2309, patient was noted to have upward gaze deviation with mouth opening and closing repetitively without concomitant eeg change. This was most likely not an epileptic event.  Charlsie Quest   CT ABDOMEN PELVIS WO CONTRAST  Addendum Date: 02/13/2023   ADDENDUM REPORT: 02/13/2023 07:34 ADDENDUM: Study discussed by telephone with Dr. Huey Bienenstock on 02/13/2023 at 0732 hours. Electronically Signed   By: Odessa Fleming M.D.   On: 02/13/2023 07:34   Result Date: 02/13/2023 CLINICAL DATA:  82 year old female with blood loss anemia. Query bleeding source. EXAM: CT ABDOMEN AND PELVIS WITHOUT CONTRAST TECHNIQUE: Multidetector CT  imaging of the abdomen and pelvis was performed following the standard protocol without IV contrast. RADIATION DOSE REDUCTION: This exam was performed according to the departmental dose-optimization program which includes automated exposure control, adjustment of the mA and/or kV according to patient size and/or use of iterative reconstruction technique. COMPARISON:  CT Chest, Abdomen, and Pelvis 12/01/2020. FINDINGS: Lower chest: No cardiomegaly. Small layering pleural effusions with associated lower lobe lung opacity most resembling atelectasis. Hepatobiliary: Small layering gallstones. Negative noncontrast liver. Pancreas: Negative. Spleen: Negative. Adrenals/Urinary Tract: Negative adrenal glands, noncontrast kidneys. Foley catheter decompresses the urinary bladder, which is bordered by some pelvic hematoma. See additional details below. Stomach/Bowel: Enteric feeding tube terminates in the 2nd portion of the duodenum right upper quadrant series 3, image 25. No free air. No dilated large or small bowel loops. Nonspecific fluid containing large bowel from the right colon to the redundant splenic flexure. Decompressed descending colon with extensive diverticulosis. Redundant but decompressed sigmoid colon. However, there is a small volume of layering complex fluid density in both pericolic gutters, on the right tracking up toward the inferior margin of the liver. But this is a relatively small volume compared to pelvis findings below. Vascular/Lymphatic: Normal caliber abdominal aorta. Aortoiliac calcified atherosclerosis. Vascular patency is not evaluated in the absence of IV contrast. No lymphadenopathy identified. Reproductive: Chronic left ovarian cyst with simple fluid density has not significantly changed since CT pelvis 08/31/2019 (no follow-up imaging recommended). Uterus remains in place. Other: Nonspecific mild to moderate presacral stranding or hyperdense fluid,, and this is probably layering presacral  hematoma. However, there is heterogeneous enlargement of the lower right rectus muscle, primarily between the deep rectus and the peritoneal cavity. This extends into the deep pelvis anterior space of Retzius, and encompasses 42 x 71 x 103 mm (AP by transverse  by CC) for an estimated hematoma volume there of 154 mL. Smaller volume additional pelvic sidewall hematoma also identified on the right series 3, image 66. 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 Spine degeneration. No acute or suspicious osseous lesion. IMPRESSION: 1. Positive for evidence of Pelvic Hematoma, which might have originated from Intramuscular hemorrhage of the Right Lower Rectus Muscle. Deep lower right rectus muscle sheath hematoma is estimated at 154 mL, with evidence of additional scattered pelvic sidewall, space of Retzius, and presacral hematoma. 2. Furthermore, there is a small volume of paracolic gutter layering Hemoperitoneum suspected. But no evidence of bowel obstruction or bowel perforation. But nonspecific fluid in the proximal large bowel. Query red blood per rectum. 3. Enteric feeding tube placed into the proximal duodenum. Foley catheter decompresses the urinary bladder. 4. Small layering pleural effusions with lung base atelectasis. 5. Cholelithiasis.  Aortic Atherosclerosis (ICD10-I70.0). Electronically Signed: By: Odessa Fleming M.D. On: 02/13/2023 07:29   DG CHEST PORT 1 VIEW  Result Date: 02/13/2023 CLINICAL DATA:  82 year old female with rectus muscle/pelvis hematoma. Anemia. Shortness of breath. EXAM: PORTABLE CHEST 1 VIEW COMPARISON:  CT Abdomen and Pelvis 0700 hours today. FINDINGS: Portable AP view at 0511 hours. Enteric feeding tube courses to the abdomen. Small volume layering pleural effusions and lung base atelectasis better demonstrated by CT. Otherwise Allowing for portable technique the lungs are clear. Mediastinal contours are within normal limits. Calcified aortic  atherosclerosis. Visualized tracheal air column is within normal limits. Negative visible bowel gas pattern. IMPRESSION: 1. Small layering pleural effusions and lung base by CT. 2. No other acute cardiopulmonary abnormality. Electronically Signed   By: Odessa Fleming M.D.   On: 02/13/2023 07:34   DG Abd Portable 1V  Result Date: 02/11/2023 CLINICAL DATA:  Feeding tube placement EXAM: PORTABLE ABDOMEN - 1 VIEW COMPARISON:  None Available. FINDINGS: Enteric tube tip is at the level of the gastric antrum/proximal duodenal. No dilated bowel loops are seen. No fractures are identified. IMPRESSION: Enteric tube tip is at the level of the gastric antrum/proximal duodenal. Electronically Signed   By: Darliss Cheney M.D.   On: 02/11/2023 15:33   Overnight EEG with video  Result Date: 02/05/2023 Charlsie Quest, MD     02/07/2023  4:52 AM Patient Name: SHARLET STEINBRECHER MRN: [id number] Epilepsy Attending: Charlsie Quest Referring Physician/Provider: Marjorie Smolder, NP Duration: 02/04/2023 1629 02/05/2023 1629 Patient history: 82 y.o. female with a past medical history of CLL, ITP, left parietal ICH in 2021 presenting with aphasia and generalized weakness. EEG to evaluate for seizure. Level of alertness: lethargic AEDs during EEG study: LEV Technical aspects: This EEG study was done with scalp electrodes positioned according to the 10-20 International system of electrode placement. Electrical activity was reviewed with band pass filter of 1-70Hz , sensitivity of 7 uV/mm, display speed of 63mm/sec with a 60Hz  notched filter applied as appropriate. EEG data were recorded continuously and digitally stored.  Video monitoring was available and reviewed as appropriate. Description: At the beginning of study, EEG showed lateralized periodic discharges in left hemisphere, maximal left parietal region at 2.5-3Hz  admixed with 3-5hz  theta-delta slowing and overriding 13-15hz  beta activity. Clinically patient was aphasic per  neurologist. 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 Patient was loaded with Keppra  on 02/04/2023 at 1716 and subsequently status epilepticus resolved. After around 1830, EEG showed continuous generalized and lateralized left hemisphere 3 to 6 Hz theta-delta slowing. Lateralized periodic discharges at 0.5-1hz  were  also noted arising from left hemisphere. Hyperventilation and photic stimulation were not performed.   EEG was difficult to interpret between 02/04/2023 1946 to 02/05/2023 1010 due to significant electrode artifact. 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 Clinically, per neurologist patient was aphasic. Patient was loaded with keppra  on 02/04/2023 at 1716 and subsequently status epilepticus resolved. EEG was then suggestive of epileptogenicity and cortical dysfunction arising from left hemisphere likely secondary to underlying structural abnormality and increased risk of seizure recurrence. Lastly there is moderate diffuse encephalopathy. Charlsie Quest   DG Chest Port 1 View  Result Date: 02/04/2023 CLINICAL DATA:  Altered mental status EXAM: PORTABLE CHEST 1 VIEW COMPARISON:  CT 12/01/2020 FINDINGS: Low lung volumes. Patchy retrocardiac opacity. No pleural effusion. Normal cardiac size. Aortic atherosclerosis IMPRESSION: Low lung volumes with patchy retrocardiac opacity, atelectasis versus pneumonia. Electronically Signed   By: Jasmine Pang M.D.   On: 02/04/2023 17:22   MR BRAIN WO CONTRAST  Result Date: 02/04/2023 CLINICAL DATA:  Neuro deficit, acute, stroke suspected. EXAM: MRI HEAD WITHOUT CONTRAST TECHNIQUE: Multiplanar, multiecho pulse  sequences of the brain and surrounding structures were obtained without intravenous contrast. COMPARISON:  Head CT 02/04/2023 and older.  MRI brain 01/14/2016. FINDINGS: Brain: No acute infarct or hemorrhage. Sequela of prior hemorrhage in the left superior temporal gyrus. Old microhemorrhage in the posterior right temporal lobe. No hydrocephalus or extra-axial collection. No mass or midline shift. Vascular: Normal flow voids. Skull and upper cervical spine: Normal marrow signal. Sinuses/Orbits: Unremarkable. Other: None. IMPRESSION: No acute intracranial process. Electronically Signed   By: Orvan Falconer M.D.   On: 02/04/2023 16:10   CT ANGIO HEAD NECK W WO CM (CODE STROKE)  Result Date: 02/04/2023 CLINICAL DATA:  Neuro deficit, acute, stroke suspected right sided weakness, aphasia. EXAM: CT ANGIOGRAPHY HEAD AND NECK WITH AND WITHOUT CONTRAST TECHNIQUE: Multidetector CT imaging of the head and neck was performed using the standard protocol during bolus administration of intravenous contrast. Multiplanar CT image reconstructions and MIPs were obtained to evaluate the vascular anatomy. Carotid stenosis measurements (when applicable) are obtained utilizing NASCET criteria, using the distal internal carotid diameter as the denominator. RADIATION DOSE REDUCTION: This exam was performed according to the departmental dose-optimization program which includes automated exposure control, adjustment of the mA and/or kV according to patient size and/or use of iterative reconstruction technique. CONTRAST:  50mL OMNIPAQUE IOHEXOL 350 MG/ML SOLN COMPARISON:  Head CT 02/04/2023.  MRI brain 01/14/2016. FINDINGS: CTA NECK FINDINGS Aortic arch: Three-vessel arch configuration. Atherosclerotic calcifications of the aortic arch and arch vessel origins. Arch vessel origins are patent. Right carotid system: No evidence of dissection, stenosis (50% or greater), or occlusion. Left carotid system: No evidence of dissection, stenosis  (50% or greater), or occlusion. Vertebral arteries: Codominant. No evidence of dissection, stenosis (50% or greater), or occlusion. 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 Other neck: Unremarkable. Upper chest: Unremarkable. Review of the MIP images confirms the above findings CTA HEAD FINDINGS Anterior circulation: Intracranial ICAs are patent without stenosis or aneurysm. The proximal ACAs and MCAs are patent without stenosis or aneurysm. Distal branches are symmetric. Posterior circulation: Normal basilar artery. The SCAs, AICAs and PICAs are patent proximally. The PCAs are patent proximally without stenosis  or aneurysm. Distal branches are symmetric. Venous sinuses: Patent. 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 Review of the MIP images confirms the above findings IMPRESSION: No large vessel occlusion, hemodynamically significant stenosis or aneurysm of the head or neck vessels. Aortic Atherosclerosis (ICD10-I70.0). Electronically Signed   By: Orvan Falconer M.D.   On: 02/04/2023 15:42   CT HEAD CODE STROKE WO CONTRAST  Result Date: 02/04/2023 CLINICAL DATA:  Code stroke. Neuro deficit, acute, stroke suspected no lateralizing information provided. EXAM: CT HEAD WITHOUT CONTRAST TECHNIQUE: Contiguous axial images were obtained from the base of the skull through the vertex without intravenous contrast. RADIATION DOSE REDUCTION: This exam was performed according to the departmental dose-optimization program which includes automated exposure control, adjustment of the mA and/or kV according to patient size and/or use of iterative reconstruction technique. COMPARISON:  CT Head 11/14/19 FINDINGS: Brain: No evidence of acute infarction, hemorrhage, hydrocephalus, extra-axial collection or mass lesion/mass effect. Sequela of moderate chronic microvascular ischemic change. Unchanged small cystic  lesion in the left temporal lobe (series 2, image 17), previously favored to represent a chronic evolving hemorrhage. Vascular: No hyperdense vessel or unexpected calcification. Skull: Normal. Negative for fracture or focal lesion. Sinuses/Orbits: No middle ear or mastoid effusion. Paranasal sinuses are clear. Orbits are notable for bilateral lens replacement, otherwise unremarkable. Other: None. ASPECTS [facility] Stroke Program Early CT Score):: 10 IMPRESSION: No acute hemorrhage or CT evidence of an acute cortical infarct. Findings were paged to Dr. Pearlean Brownie on 02/04/23 at 3:17 PM via [facility] paging system. Electronically Signed   By: Lorenza Cambridge M.D.   On: 02/04/2023 15:17    Micro Results    No results found for this or any previous visit (from the past 240 hour(s)).  Today   Subjective    Reverie Villagrana today has no headache,no chest abdominal pain,no new weakness tingling or numbness, feels much better   Objective   Blood pressure 150/90, pulse 97, temperature (!) 97.2 F (36.2 C), temperature source Axillary, resp. rate 19, weight 51.2 kg, SpO2 96 %.  No intake or output data in the 24 hours ending 02/28/23 0824   Exam  Awake, pleasantly confused this morning, calm, No new F.N deficits,    .AT,PERRAL Supple Neck,   Symmetrical Chest wall movement, Good air movement bilaterally, CTAB RRR,No Gallops,   +ve B.Sounds, Abd Soft, Non tender,  No Cyanosis, Clubbing or edema    Data Review   Recent Labs  Lab 02/22/23 0334 02/25/23 0304 02/27/23 0654  WBC 9.1 8.8 8.7  HGB 12.0 13.4 13.2  HCT 36.7 40.3 39.2  PLT 288 318 307  MCV 94.1 90.0 90.7  MCH 30.8 29.9 30.6  MCHC 32.7 33.3 33.7  RDW 16.3* 15.2 15.5  LYMPHSABS  --   --  2.1  MONOABS  --   --  1.0  EOSABS  --   --  0.2  BASOSABS  --   --  0.0    Recent Labs  Lab 02/21/23 1301 02/22/23 0334 02/22/23 0812 02/25/23 0304 02/27/23 0654  NA  --  140  --  137 139  K  --  4.2  --  3.5 3.9  CL  --  107  --  104 106   CO2  --  25  --  24 21*  ANIONGAP  --  8  --  9 12  GLUCOSE  --  109*  --  111* 123*  BUN  --  31*  --  18 23  CREATININE  --  0.98  --  0.76 0.83  AST  --   --   --  34  --   ALT  --   --   --  21  --   ALKPHOS  --   --   --  79  --   BILITOT  --   --   --  1.0  --   ALBUMIN  --   --   --  3.2*  --   AMMONIA 65*  --  41* 25  --   BNP  --   --   --   --  25.4  MG  --   --   --  2.1 2.3  CALCIUM  --  9.0  --  8.6* 8.5*    Total Time in preparing paper work, data evaluation and todays exam - 35 minutes  Signature  -    Susa Raring M.D on 02/28/2023 at 8:24 AM   -  To page go to [email or web address]

## 2023-02-28 ENCOUNTER — Other Ambulatory Visit: Payer: Self-pay

## 2023-02-28 DIAGNOSIS — S301XXD Contusion of abdominal wall, subsequent encounter: Secondary | ICD-10-CM | POA: Diagnosis not present

## 2023-02-28 DIAGNOSIS — E440 Moderate protein-calorie malnutrition: Secondary | ICD-10-CM | POA: Diagnosis not present

## 2023-02-28 DIAGNOSIS — Z7401 Bed confinement status: Secondary | ICD-10-CM | POA: Diagnosis not present

## 2023-02-28 DIAGNOSIS — R4701 Aphasia: Secondary | ICD-10-CM | POA: Diagnosis not present

## 2023-02-28 DIAGNOSIS — F419 Anxiety disorder, unspecified: Secondary | ICD-10-CM | POA: Diagnosis not present

## 2023-02-28 DIAGNOSIS — R41841 Cognitive communication deficit: Secondary | ICD-10-CM | POA: Diagnosis not present

## 2023-02-28 DIAGNOSIS — R451 Restlessness and agitation: Secondary | ICD-10-CM | POA: Diagnosis not present

## 2023-02-28 DIAGNOSIS — G9341 Metabolic encephalopathy: Secondary | ICD-10-CM | POA: Diagnosis not present

## 2023-02-28 DIAGNOSIS — R0689 Other abnormalities of breathing: Secondary | ICD-10-CM | POA: Diagnosis not present

## 2023-02-28 DIAGNOSIS — E785 Hyperlipidemia, unspecified: Secondary | ICD-10-CM | POA: Diagnosis not present

## 2023-02-28 DIAGNOSIS — R2681 Unsteadiness on feet: Secondary | ICD-10-CM | POA: Diagnosis not present

## 2023-02-28 DIAGNOSIS — M810 Age-related osteoporosis without current pathological fracture: Secondary | ICD-10-CM | POA: Diagnosis not present

## 2023-02-28 DIAGNOSIS — I10 Essential (primary) hypertension: Secondary | ICD-10-CM | POA: Diagnosis not present

## 2023-02-28 DIAGNOSIS — D62 Acute posthemorrhagic anemia: Secondary | ICD-10-CM | POA: Diagnosis not present

## 2023-02-28 DIAGNOSIS — G40802 Other epilepsy, not intractable, without status epilepticus: Secondary | ICD-10-CM | POA: Diagnosis not present

## 2023-02-28 DIAGNOSIS — R296 Repeated falls: Secondary | ICD-10-CM | POA: Diagnosis not present

## 2023-02-28 DIAGNOSIS — M6281 Muscle weakness (generalized): Secondary | ICD-10-CM | POA: Diagnosis not present

## 2023-02-28 DIAGNOSIS — R4182 Altered mental status, unspecified: Secondary | ICD-10-CM | POA: Diagnosis not present

## 2023-02-28 DIAGNOSIS — R404 Transient alteration of awareness: Secondary | ICD-10-CM | POA: Diagnosis not present

## 2023-02-28 DIAGNOSIS — R410 Disorientation, unspecified: Secondary | ICD-10-CM | POA: Diagnosis not present

## 2023-02-28 DIAGNOSIS — G4089 Other seizures: Secondary | ICD-10-CM | POA: Diagnosis not present

## 2023-02-28 DIAGNOSIS — R569 Unspecified convulsions: Secondary | ICD-10-CM | POA: Diagnosis not present

## 2023-02-28 DIAGNOSIS — R2689 Other abnormalities of gait and mobility: Secondary | ICD-10-CM | POA: Diagnosis not present

## 2023-02-28 DIAGNOSIS — D696 Thrombocytopenia, unspecified: Secondary | ICD-10-CM | POA: Diagnosis not present

## 2023-02-28 MED ORDER — QUETIAPINE FUMARATE 50 MG PO TABS: 50.0000 mg | ORAL_TABLET | Freq: Every day | ORAL | Status: DC

## 2023-02-28 MED ORDER — QUETIAPINE FUMARATE 25 MG PO TABS: 25.0000 mg | ORAL_TABLET | Freq: Every day | ORAL | Status: AC | PRN

## 2023-02-28 NOTE — Progress Notes (Signed)
Report called to [facility] for patient transferring to  [facility]. CM to notify transport for patient.

## 2023-02-28 NOTE — Progress Notes (Signed)
Patient discharged to [facility] with [facility].

## 2023-02-28 NOTE — TOC Transition Note (Signed)
Transition of Care [facility]) - CM/SW Discharge Note   Patient Details  Name: Selena Branch MRN: [id number] Date of Birth: [birth year]  Transition of [facility] CM/SW Contact:  Mearl Latin, LCSW Phone Number: 02/28/2023, 9:45 AM   Clinical Narrative:    Patient will DC to: [facility] Anticipated DC date: 6/17 Family notified: Transport by:   Per MD patient ready for DC to [facility]. RN to call report prior to discharge [phone] room 505). RN, patient, patient's family, and facility notified of DC. Discharge Summary and FL2 sent to facility. DC packet on chart including signed script and DNR. Ambulance transport requested for patient.   CSW will sign off for now as social work intervention is no longer needed. Please consult [place] again if new needs arise.     Final next level of care: Skilled Nursing Facility Barriers to Discharge: Barriers Resolved   Patient Goals and CMS Choice CMS Medicare.gov Compare Post Acute Care list provided to:: Patient Represenative (must comment) Choice offered to / list presented to : Adult Children, Spouse  Discharge Placement     Existing PASRR number confirmed : 02/28/23          Patient chooses bed at: [facility] Patient to be transferred to facility by: [facility] Name of family member notified: Daughter Patient and family notified of of transfer: 02/28/23  Discharge Plan and Services Additional resources added to the After Visit Summary for   In-house Referral: Clinical Social Work   Post Acute Care Choice: Skilled Nursing Facility                               Social Determinants of Health (SDOH) Interventions SDOH Screenings   Depression (PHQ2-9): Low Risk  (09/28/2019)  Tobacco Use: Low Risk  (02/04/2023)     Readmission Risk Interventions     No data to display

## 2023-02-28 NOTE — Progress Notes (Signed)
[facility]                                                                                                                                                                         Patient Demographics  Selena Branch, is a 82 y.o. female  WUJ:[id number]  [id number]  DOB - 03-27-1941  Admit date - 02/04/2023  Admitting Physician Rodolph Bong, MD  Outpatient Primary MD for the patient is Gweneth Dimitri, MD  LOS - 23   Chief Complaint  Patient presents with   Code Stroke        Assessment & Plan    Patient seen briefly today due for discharge soon per Discharge done yesterday by Dr me, no further issues, Vital signs stable, patient feels fine.  Seroquel dose adjusted further today.   Medications  Scheduled Meds:  amLODipine  10 mg Oral QHS   atorvastatin  40 mg Per Tube Daily   carvedilol  3.125 mg Oral BID WC   chlorhexidine gluconate (MEDLINE KIT)  15 mL Mouth Rinse BID   Chlorhexidine Gluconate Cloth  6 each Topical Daily   dorzolamide-timolol  1 drop Both Eyes Daily   eltrombopag  25 mg Oral Q M,W,F   enoxaparin (LOVENOX) injection  40 mg Subcutaneous Q24H   hydrALAZINE  25 mg Oral Q8H   lacosamide  200 mg Oral BID   latanoprost  1 drop Both Eyes QHS   melatonin  5 mg Oral QHS   multivitamin with minerals  1 tablet Per Tube Daily   pantoprazole  40 mg Oral BID   QUEtiapine  50 mg Oral QHS   silver sulfADIAZINE   Topical BID   sodium chloride flush  3 mL Intravenous Q12H   Continuous Infusions: PRN Meds:.acetaminophen **OR** acetaminophen, hydrALAZINE, midazolam, ondansetron **OR** ondansetron (ZOFRAN) IV, polyethylene glycol, QUEtiapine, sorbitol    Time Spent in minutes   10 minutes   Susa Raring M.D on 02/28/2023 at 8:25 AM  Between 7am to 7pm - Pager - [phone]  After 7pm go to [email or web address] - password [email or web address]  And look for the night coverage person covering for me  after hours  [facility] Office  [phone]    Subjective:   Denver Faster today has, No headache, No chest pain, No abdominal pain - No Nausea, No new weakness tingling or numbness, No Cough - SOB   Objective:   Vitals:   02/27/23 0800 02/27/23 0900 02/27/23 1600 02/28/23 0500  BP: (!) 142/82  (!) 166/96   Pulse: 100 99 97   Resp: 17 19    Temp:   (!) 97.2 F (36.2 C)   TempSrc:   Axillary   SpO2: 95% 96% 96%   Weight:  51.2 kg    Wt Readings from Last 3 Encounters:  02/28/23 51.2 kg  10/29/22 48.3 kg  07/30/22 48.8 kg    No intake or output data in the 24 hours ending 02/28/23 0825  Exam  Awake, pleasantly confused no new F.N deficits, Normal affect [place].AT,PERRAL Supple Neck, No JVD,   Symmetrical Chest wall movement, Good air movement bilaterally, CTAB RRR,No Gallops, Rubs or new Murmurs,  +ve B.Sounds, Abd Soft, No tenderness,   No Cyanosis, Clubbing or edema   Data Review

## 2023-03-01 DIAGNOSIS — S301XXD Contusion of abdominal wall, subsequent encounter: Secondary | ICD-10-CM | POA: Diagnosis not present

## 2023-03-01 DIAGNOSIS — M810 Age-related osteoporosis without current pathological fracture: Secondary | ICD-10-CM | POA: Diagnosis not present

## 2023-03-01 DIAGNOSIS — R41841 Cognitive communication deficit: Secondary | ICD-10-CM | POA: Diagnosis not present

## 2023-03-01 DIAGNOSIS — F419 Anxiety disorder, unspecified: Secondary | ICD-10-CM | POA: Diagnosis not present

## 2023-03-02 DIAGNOSIS — M6281 Muscle weakness (generalized): Secondary | ICD-10-CM | POA: Diagnosis not present

## 2023-03-02 DIAGNOSIS — R296 Repeated falls: Secondary | ICD-10-CM | POA: Diagnosis not present

## 2023-03-03 DIAGNOSIS — M6281 Muscle weakness (generalized): Secondary | ICD-10-CM | POA: Diagnosis not present

## 2023-03-03 DIAGNOSIS — R2689 Other abnormalities of gait and mobility: Secondary | ICD-10-CM | POA: Diagnosis not present

## 2023-03-03 DIAGNOSIS — R569 Unspecified convulsions: Secondary | ICD-10-CM | POA: Diagnosis not present

## 2023-03-04 DIAGNOSIS — G4089 Other seizures: Secondary | ICD-10-CM | POA: Diagnosis not present

## 2023-03-04 DIAGNOSIS — G9341 Metabolic encephalopathy: Secondary | ICD-10-CM | POA: Diagnosis not present

## 2023-03-04 DIAGNOSIS — E785 Hyperlipidemia, unspecified: Secondary | ICD-10-CM | POA: Diagnosis not present

## 2023-03-04 DIAGNOSIS — I10 Essential (primary) hypertension: Secondary | ICD-10-CM | POA: Diagnosis not present

## 2023-03-09 DIAGNOSIS — K5900 Constipation, unspecified: Secondary | ICD-10-CM | POA: Diagnosis not present

## 2023-03-09 DIAGNOSIS — R110 Nausea: Secondary | ICD-10-CM | POA: Diagnosis not present

## 2023-03-09 DIAGNOSIS — F03918 Unspecified dementia, unspecified severity, with other behavioral disturbance: Secondary | ICD-10-CM | POA: Diagnosis not present

## 2023-03-10 ENCOUNTER — Other Ambulatory Visit: Payer: Self-pay

## 2023-03-10 ENCOUNTER — Other Ambulatory Visit (HOSPITAL_BASED_OUTPATIENT_CLINIC_OR_DEPARTMENT_OTHER): Payer: Self-pay

## 2023-03-10 ENCOUNTER — Emergency Department (HOSPITAL_BASED_OUTPATIENT_CLINIC_OR_DEPARTMENT_OTHER): Payer: PPO

## 2023-03-10 ENCOUNTER — Emergency Department (HOSPITAL_BASED_OUTPATIENT_CLINIC_OR_DEPARTMENT_OTHER): Payer: PPO | Admitting: Radiology

## 2023-03-10 ENCOUNTER — Emergency Department (HOSPITAL_BASED_OUTPATIENT_CLINIC_OR_DEPARTMENT_OTHER)
Admission: EM | Admit: 2023-03-10 | Discharge: 2023-03-10 | Disposition: A | Discharge status: Home / Self Care | Payer: PPO | Source: Home / Self Care | Attending: Emergency Medicine | Admitting: Emergency Medicine

## 2023-03-10 ENCOUNTER — Encounter (HOSPITAL_BASED_OUTPATIENT_CLINIC_OR_DEPARTMENT_OTHER): Payer: Self-pay

## 2023-03-10 DIAGNOSIS — K644 Residual hemorrhoidal skin tags: Secondary | ICD-10-CM | POA: Insufficient documentation

## 2023-03-10 DIAGNOSIS — R1030 Lower abdominal pain, unspecified: Secondary | ICD-10-CM | POA: Diagnosis not present

## 2023-03-10 DIAGNOSIS — R4182 Altered mental status, unspecified: Secondary | ICD-10-CM | POA: Diagnosis not present

## 2023-03-10 DIAGNOSIS — Z856 Personal history of leukemia: Secondary | ICD-10-CM | POA: Insufficient documentation

## 2023-03-10 DIAGNOSIS — K5730 Diverticulosis of large intestine without perforation or abscess without bleeding: Secondary | ICD-10-CM | POA: Diagnosis not present

## 2023-03-10 DIAGNOSIS — K449 Diaphragmatic hernia without obstruction or gangrene: Secondary | ICD-10-CM | POA: Diagnosis not present

## 2023-03-10 DIAGNOSIS — K5641 Fecal impaction: Secondary | ICD-10-CM | POA: Insufficient documentation

## 2023-03-10 DIAGNOSIS — E876 Hypokalemia: Secondary | ICD-10-CM | POA: Diagnosis not present

## 2023-03-10 DIAGNOSIS — N83202 Unspecified ovarian cyst, left side: Secondary | ICD-10-CM | POA: Diagnosis not present

## 2023-03-10 DIAGNOSIS — C9110 Chronic lymphocytic leukemia of B-cell type not having achieved remission: Secondary | ICD-10-CM | POA: Diagnosis not present

## 2023-03-10 DIAGNOSIS — K5909 Other constipation: Secondary | ICD-10-CM | POA: Diagnosis not present

## 2023-03-10 LAB — CBC WITH DIFFERENTIAL/PLATELET
Abs Immature Granulocytes: 0.02 10*3/uL (ref 0.00–0.07)
Basophils Absolute: 0 10*3/uL (ref 0.0–0.1)
Basophils Relative: 0 %
Eosinophils Absolute: 0.2 10*3/uL (ref 0.0–0.5)
Eosinophils Relative: 2 %
HCT: 40.4 % (ref 36.0–46.0)
Hemoglobin: 13.4 g/dL (ref 12.0–15.0)
Immature Granulocytes: 0 %
Lymphocytes Relative: 27 %
Lymphs Abs: 2.2 10*3/uL (ref 0.7–4.0)
MCH: 31.2 pg (ref 26.0–34.0)
MCHC: 33.2 g/dL (ref 30.0–36.0)
MCV: 94 fL (ref 80.0–100.0)
Monocytes Absolute: 0.6 10*3/uL (ref 0.1–1.0)
Monocytes Relative: 7 %
Neutro Abs: 5.2 10*3/uL (ref 1.7–7.7)
Neutrophils Relative %: 64 %
Platelets: 198 10*3/uL (ref 150–400)
RBC: 4.3 MIL/uL (ref 3.87–5.11)
RDW: 16.2 % — ABNORMAL HIGH (ref 11.5–15.5)
WBC: 8.2 10*3/uL (ref 4.0–10.5)
nRBC: 0 % (ref 0.0–0.2)

## 2023-03-10 LAB — BASIC METABOLIC PANEL
Anion gap: 8 (ref 5–15)
BUN: 13 mg/dL (ref 8–23)
CO2: 30 mmol/L (ref 22–32)
Calcium: 9.1 mg/dL (ref 8.9–10.3)
Chloride: 101 mmol/L (ref 98–111)
Creatinine, Ser: 0.75 mg/dL (ref 0.44–1.00)
GFR, Estimated: >60 mL/min (ref >60)
Glucose, Bld: 102 mg/dL — ABNORMAL HIGH (ref 70–99)
Potassium: 4.1 mmol/L (ref 3.5–5.1)
Sodium: 139 mmol/L (ref 135–145)

## 2023-03-10 MED ORDER — FLEET ENEMA 7-19 GM/118ML RE ENEM
1.0000 | ENEMA | Freq: Once | RECTAL | Status: AC
  Administered 2023-03-10: 1 via RECTAL
  Filled 2023-03-10: qty 1

## 2023-03-10 MED ORDER — IOHEXOL 300 MG/ML  SOLN
100.0000 mL | Freq: Once | INTRAMUSCULAR | Status: AC | PRN
  Administered 2023-03-10: 60 mL via INTRAVENOUS

## 2023-03-10 MED ORDER — GLYCERIN (ADULT) 2 G RE SUPP: 1.0000 | RECTAL | 0 refills | Status: AC | PRN

## 2023-03-10 MED ORDER — LIDOCAINE HCL URETHRAL/MUCOSAL 2 % EX GEL
1.0000 | Freq: Once | CUTANEOUS | Status: AC
  Administered 2023-03-10: 1 via TOPICAL
  Filled 2023-03-10: qty 11

## 2023-03-10 MED ORDER — POLYETHYLENE GLYCOL 3350 17 GM/SCOOP PO POWD: 17.0000 g | Freq: Every day | ORAL | 0 refills | Status: AC

## 2023-03-10 MED ORDER — LIDOCAINE HCL 2 % EX GEL: 1.0000 | CUTANEOUS | 1 refills | Status: AC | PRN

## 2023-03-10 NOTE — ED Triage Notes (Signed)
Pt recently in hospital x 3 weeks, Rehab x 1 week, family unsure if pt had BM, pt has been home x 1 week, has not had BM. Pt c/o lower abd pain and nausea.

## 2023-03-10 NOTE — ED Notes (Signed)
Pt up on BSC, able to urinate but unable to have BM d/t pain

## 2023-03-10 NOTE — ED Provider Notes (Signed)
[place] EMERGENCY DEPARTMENT AT [facility] Provider Note   CSN: [id number] Arrival date & time: 03/10/23  1053     History  Chief Complaint  Patient presents with   Constipation    Selena Branch is a 82 y.o. female.  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  Patient has had no significant stooling since being out of the hospital.  Patient has been passing some small amount of liquid stool.  Daughter at bedside states that her mental faculties have changed dramatically from a month ago prior to her admission.  She has more confusion at baseline and when she attempts to use the bathroom, will stay on the toilet for about 10 seconds before getting up due to pain.  No vomiting.  Family started a "laxative" yesterday as well as a stool softener.  Per family report, patient has been "generally miserable" with lower abdominal pain and discomfort.       Home Medications Prior to Admission medications   Medication Sig Start Date End Date Taking? Authorizing Provider  amLODipine (NORVASC) 10 MG tablet Take 1 tablet (10 mg total) by mouth at bedtime. Patient taking differently: Take 5 mg by mouth at bedtime. 09/20/19   Mikhail, Nita Sells, DO  atorvastatin (LIPITOR) 40 MG tablet Take 40 mg by mouth daily.     [provider]  Brinzolamide-Brimonidine (SIMBRINZA) 1-0.2 % SUSP Place 1 drop into both eyes 2 (two) times daily.    [provider]  Calcium Carb-Cholecalciferol (CALCIUM + VITAMIN D3 PO) Take 1 tablet by mouth daily.    [provider]  carvedilol (COREG) 3.125 MG tablet Take 1 tablet (3.125 mg total) by mouth 2 (two) times daily with a meal. 02/27/23   Leroy Sea, MD  dorzolamide-timolol (COSOPT) 22.3-6.8 MG/ML ophthalmic solution Place 1 drop into both eyes 2 (two) times daily. 12/10/15   [provider]   eltrombopag (PROMACTA) 25 MG tablet Take 1 tablet (25 mg) on Monday Wednesday and Friday .take on an empty stomach, 1 hour before a meal or 2 hours after. Patient taking differently: Take 25 mg by mouth as directed. Take 1 tablet (25 mg) on Monday, Wednesday, Thursday and Saturday. Take on an empty stomach, 1 hour before a meal or 2 hours after. 01/14/23   Johney Maine, MD  ibandronate (BONIVA) 150 MG tablet Take 150 mg by mouth every 28 (twenty-eight) days. 10/26/21   [provider]  lacosamide (VIMPAT) 200 MG TABS tablet Take 1 tablet (200 mg total) by mouth 2 (two) times daily. 02/27/23   Leroy Sea, MD  latanoprost (XALATAN) 0.005 % ophthalmic solution Place 1 drop into both eyes at bedtime.  12/10/15   [provider]  LORazepam (ATIVAN) 1 MG tablet Take 1 tablet (1 mg total) by mouth every 8 (eight) hours as needed for seizure. 02/27/23 02/27/24  Leroy Sea, MD  Multiple Vitamins-Minerals (WOMENS 50+ MULTI VITAMIN/MIN) TABS Take 1 tablet by mouth daily.    [provider]  pantoprazole (PROTONIX) 40 MG tablet Take 1 tablet (40 mg total) by mouth 2 (two) times daily. 02/27/23   Leroy Sea, MD  polyethylene glycol (MIRALAX / GLYCOLAX) 17 g packet Place 17 g into feeding tube daily as needed for mild constipation. 02/27/23   Leroy Sea, MD  QUEtiapine (SEROQUEL) 25 MG tablet Take 1 tablet (25 mg total)  by mouth daily as needed (agitation). 02/28/23   Leroy Sea, MD  QUEtiapine (SEROQUEL) 50 MG tablet Take 1 tablet (50 mg total) by mouth at bedtime. 02/28/23   Leroy Sea, MD      Allergies    Codeine, Evista [raloxifene], Actonel [risedronate], Boniva [ibandronic acid], Fosamax [alendronate], and Prednisone    Review of Systems   Review of Systems  Physical Exam Updated Vital Signs BP (!) 141/54 (BP Location: Left Arm)   Pulse 66   Temp 97.8 F (36.6 C) (Oral)   Resp 18   Ht 4\' 10"  (1.473 m)   Wt 46.3 kg   SpO2 99%    BMI 21.32 kg/m   Physical Exam Vitals and nursing note reviewed. Exam conducted with a chaperone present.  Constitutional:      General: She is not in acute distress.    Appearance: She is well-developed.  HENT:     Head: Normocephalic and atraumatic.     Right Ear: External ear normal.     Left Ear: External ear normal.     Nose: Nose normal.  Eyes:     Conjunctiva/sclera: Conjunctivae normal.  Cardiovascular:     Rate and Rhythm: Normal rate and regular rhythm.     Heart sounds: No murmur heard. Pulmonary:     Effort: No respiratory distress.     Breath sounds: No wheezing, rhonchi or rales.  Abdominal:     Palpations: Abdomen is soft.     Tenderness: There is no abdominal tenderness. There is no guarding or rebound.  Genitourinary:    Exam position: Knee-chest position.     Rectum: Tenderness and external hemorrhoid present.     Comments: Patient with nonthrombosed external hemorrhoids noted.  She has moderate tenderness around the anus.  She has a fecal impaction with soft stool.  There is yellow, nonbloody, liquid noted through the anal sphincter. Musculoskeletal:     Cervical back: Normal range of motion and neck supple.     Right lower leg: No edema.     Left lower leg: No edema.  Skin:    General: Skin is warm and dry.     Findings: No rash.  Neurological:     General: No focal deficit present.     Mental Status: She is alert. Mental status is at baseline.     Motor: No weakness.  Psychiatric:        Mood and Affect: Mood normal.     ED Results / Procedures / Treatments   Labs (all labs ordered are listed, but only abnormal results are displayed) Labs Reviewed  CBC WITH DIFFERENTIAL/PLATELET - Abnormal; Notable for the following components:      Result Value   RDW 16.2 (*)    All other components within normal limits  BASIC METABOLIC PANEL - Abnormal; Notable for the following components:   Glucose, Bld 102 (*)    All other components within normal limits     EKG None  Radiology CT ABDOMEN PELVIS W CONTRAST  Result Date: 03/10/2023 CLINICAL DATA:  Lower abdominal pain and nausea. Constipation. Recent pelvic hematoma. Chronic lymphocytic leukemia. * Tracking Code: BO * EXAM: CT ABDOMEN AND PELVIS WITH CONTRAST TECHNIQUE: Multidetector CT imaging of the abdomen and pelvis was performed using the standard protocol following bolus administration of intravenous contrast. RADIATION DOSE REDUCTION: This exam was performed according to the departmental dose-optimization program which includes automated exposure control, adjustment of the mA and/or kV according to patient size and/or  use of iterative reconstruction technique. CONTRAST:  60mL OMNIPAQUE IOHEXOL 300 MG/ML  SOLN COMPARISON:  Noncontrast CT on 02/13/2023 FINDINGS: Lower Chest: No acute findings. Hepatobiliary: No suspicious hepatic masses identified. Gallbladder is unremarkable. No evidence of biliary ductal dilatation. Pancreas:  No mass or inflammatory changes. Spleen: Within normal limits in size and appearance. Adrenals/Urinary Tract: No suspicious masses identified. No evidence of ureteral calculi or hydronephrosis. Stomach/Bowel: Stable small hiatal hernia. Large colonic stool burden is now seen, with stool distending the rectum. These findings are suspicious for constipation. Decreased presacral soft tissue stranding noted. No acute inflammatory process or abnormal fluid collections. Vascular/Lymphatic: No pathologically enlarged lymph nodes. No acute vascular findings. Aortic atherosclerotic calcification incidentally noted. Reproductive: Uterus is unremarkable. Simple appearing cyst in left adnexa measures 6.3 x 5.4 cm, without significant change since prior exam. No evidence of free fluid. Small right pelvic sidewall hematoma shows decreased size and attenuation since previous study, currently measuring 2.3 x 1.4 cm, compared to 2.6 x 2.4 cm previously. Other:  None. Musculoskeletal: No  suspicious bone lesions identified. Right rectus sheath hematoma in the right suprapubic region shows decreased size and attenuation since prior study, currently measuring 3.4 x 2.6 cm compared to 7.1 by 4.2 cm previously. IMPRESSION: Large colonic stool burden, with stool distending the rectum, significantly increased in size since previous study. These findings are suspicious for constipation. Decreased size and attenuation of small right rectus sheath and right pelvic sidewall hematomas. Stable small hiatal hernia. Stable 6.3 cm benign-appearing left adnexal cyst. Recommend follow-up US in 6-12 months. Note: This recommendation does not apply to premenarchal patients and to those with increased risk (genetic, family history, elevated tumor markers or other high-risk factors) of ovarian cancer. Reference: JACR 2020 Feb; 17(2):248-254 Aortic Atherosclerosis (ICD10-I70.0). Electronically Signed   By: Danae Orleans M.D.   On: 03/10/2023 14:23    Procedures Fecal disimpaction  Date/Time: 03/10/2023 12:00 PM  Performed by: Renne Crigler, PA-C Authorized by: Renne Crigler, PA-C  Consent: Verbal consent obtained. Consent given by: patient and guardian Patient identity confirmed: verbally with patient and provided demographic data Preparation: Patient was prepped and draped in the usual sterile fashion. Local anesthesia used: yes  Anesthesia: Local anesthesia used: yes Local Anesthetic: topical anesthetic  Sedation: Patient sedated: no  Comments: Patient tolerated with discomfort   Fecal disimpaction  Date/Time: 03/10/2023 5:45 PM  Performed by: Renne Crigler, PA-C Authorized by: Renne Crigler, PA-C  Consent: Verbal consent obtained. Risks and benefits: risks, benefits and alternatives were discussed Consent given by: patient and guardian Patient identity confirmed: verbally with patient and provided demographic data Preparation: Patient was prepped and draped in the usual sterile  fashion. Local anesthesia used: no  Anesthesia: Local anesthesia used: no  Sedation: Patient sedated: no  Comments: Patient tolerated with discomfort       Medications Ordered in ED Medications  lidocaine (XYLOCAINE) 2 % jelly 1 Application (has no administration in time range)  sodium phosphate (FLEET) 7-19 GM/118ML enema 1 enema (has no administration in time range)    ED Course/ Medical Decision Making/ A&P    Patient seen and examined. History obtained directly from patient.   Labs/EKG: Ordered CBC, BMP.  Imaging: None ordered  Medications/Fluids: Ordered: Lidocaine jelly, Fleet enema.   Most recent vital signs reviewed and are as follows: BP (!) 141/54 (BP Location: Left Arm)   Pulse 66   Temp 97.8 F (36.6 C) (Oral)   Resp 18   Ht 4\' 10"  (1.473 m)  Wt 46.3 kg   SpO2 99%   BMI 21.32 kg/m   Initial impression: Fecal impaction, constipation. Fecal disimpaction performed with student chaperoning.   12:39 PM Reassessment performed. Patient appears stable. Per RN, enema placed, very little fluid output 2/2 patient anxiety and pain, even after application of lidocaine jelly.   Labs personally reviewed and interpreted including: CBC, BMP unremarkable.  Imaging: Ordered CT abdomen pelvis  Reviewed pertinent lab work and imaging with patient at bedside. Questions answered.   Most current vital signs reviewed and are as follows: BP (!) 141/54 (BP Location: Left Arm)   Pulse 66   Temp 97.8 F (36.6 C) (Oral)   Resp 18   Ht 4\' 10"  (1.473 m)   Wt 46.3 kg   SpO2 99%   BMI 21.32 kg/m   Plan: Will reassess after CT.  Patient discussed with and seen by Dr. Rhunette Croft.  Reassessment performed. Patient appears stable. Per daughter, she did have some return after enema.   Imaging personally visualized and interpreted including: CT, agree large stool burden, improving hematoma from previous, no signs of colitis.  Reviewed pertinent lab work and imaging with  patient and family at bedside. Questions answered.   Most current vital signs reviewed and are as follows: BP 139/61   Pulse 78   Temp 98.3 F (36.8 C)   Resp 18   Ht 4\' 10"  (1.473 m)   Wt 46.3 kg   SpO2 96%   BMI 21.32 kg/m   Plan: Will reassess.  Offered additional enema trial.  After discussion, patient agrees.  6:56 PM Reassessment performed. Patient appears stable.  She had little return after second enema although there was liquid stool noted in the patient's brief.  After discussion with patient and family, they agreed to second attempt at digital disimpaction.  This was performed with some result.  Plan: Discharge to home.  Currently recommending twice daily MiraLAX, glycerin suppositories, continue with previously prescribed stool softener.  Prescriptions written for: MiraLAX, Colace, lidocaine jelly  Other home care instructions discussed: Rest, hydration  ED return instructions discussed: The patient was urged to return to the Emergency Department immediately with worsening of current symptoms, worsening abdominal pain, persistent vomiting, blood noted in stools, fever, or any other concerns. The patient verbalized understanding.   Follow-up instructions discussed: Patient encouraged to follow-up with their PCP in 2 days.                             Medical Decision Making Amount and/or Complexity of Data Reviewed Labs: ordered. Radiology: ordered.  Risk OTC drugs. Prescription drug management.   Patient presents after recent complex hospitalization as described above.  She has had generalized abdominal pain.  No obstructive symptoms fortunately.  Patient has a significant stool burden and fecal impaction.  Fortunately no signs of colitis or other underlying infection.  No stercoral colitis.  Her labs are reasonable and she is hydrating well.  Patient received enema x 2 and disimpaction x 2 in the emergency department with moderate results.  At this point, I do  not feel strongly that she requires admission to the hospital.  She has been on only 1 day of home treatment for constipation.  Will maximize treatment with addition of MiraLAX twice daily and glycerin suppositories.  Hopefully this will help her symptoms.  Given her degree of recent disability from her illness and deconditioning at home, she is moderate risk to be present  with continued problems.  The patient's vital signs, pertinent lab work and imaging were reviewed and interpreted as discussed in the ED course. Hospitalization was considered for further testing, treatments, or serial exams/observation. However as patient is well-appearing, has a stable exam, and reassuring studies today, I do not feel that they warrant admission at this time. This plan was discussed with the patient and family who verbalizes agreement and comfort with this plan and seems reliable and able to return to the Emergency Department with worsening or changing symptoms.          Final Clinical Impression(s) / ED Diagnoses Final diagnoses:  Fecal impaction (HCC)    Rx / DC Orders ED Discharge Orders          Ordered    polyethylene glycol powder (GLYCOLAX/MIRALAX) 17 GM/SCOOP powder  Daily        03/10/23 1754    glycerin adult 2 g suppository  As needed        03/10/23 1754    lidocaine (XYLOCAINE) 2 % jelly  As needed        03/10/23 1754              Renne Crigler, PA-C 03/10/23 1901    Derwood Kaplan, MD 03/11/23 [id number]    Derwood Kaplan, MD 03/11/23 0710

## 2023-03-10 NOTE — Discharge Instructions (Signed)
Please read and follow all provided instructions.  Your diagnoses today include:  1. Fecal impaction (HCC)     Tests performed today include: Blood cell counts and platelets Kidney test Vital signs. See below for your results today.   Medications prescribed:  Miralax - laxative  This medication can be found over-the-counter.   Glycerin suppository  Take any prescribed medications only as directed.  Home care instructions:  Follow any educational materials contained in this packet.  Follow-up instructions: Please follow-up with your primary care provider in the next 2 days for further evaluation of your symptoms.    Return instructions:  SEEK IMMEDIATE MEDICAL ATTENTION IF: The pain does not go away or becomes severe  A temperature above 101F develops  Repeated vomiting occurs (multiple episodes)  The pain becomes localized to portions of the abdomen. The right side could possibly be appendicitis. In an adult, the left lower portion of the abdomen could be colitis or diverticulitis.  Blood is being passed in stools or vomit (bright red or black tarry stools)  You develop chest pain, difficulty breathing, dizziness or fainting, or become confused, poorly responsive, or inconsolable (young children) If you have any other emergent concerns regarding your health  Additional Information: Abdominal (belly) pain can be caused by many things. Your caregiver performed an examination and possibly ordered blood/urine tests and imaging (CT scan, x-rays, ultrasound). Many cases can be observed and treated at home after initial evaluation in the emergency department. Even though you are being discharged home, abdominal pain can be unpredictable. Therefore, you need a repeated exam if your pain does not resolve, returns, or worsens. Most patients with abdominal pain don't have to be admitted to the hospital or have surgery, but serious problems like appendicitis and gallbladder attacks can start  out as nonspecific pain. Many abdominal conditions cannot be diagnosed in one visit, so follow-up evaluations are very important.  Your vital signs today were: BP 129/63 (BP Location: Right Arm)   Pulse 73   Temp 98.3 F (36.8 C)   Resp 18   Ht 4\' 10"  (1.473 m)   Wt 46.3 kg   SpO2 98%   BMI 21.32 kg/m  If your blood pressure (bp) was elevated above 135/85 this visit, please have this repeated by your doctor within one month. --------------

## 2023-03-10 NOTE — ED Notes (Signed)
Pt discharged in stable condition with daughter. Pt and daughter expressed understanding about discharge instructions and rx and to follow up with pcp and to return to ER for any further concerns or complications.

## 2023-03-11 ENCOUNTER — Other Ambulatory Visit: Payer: Self-pay

## 2023-03-11 DIAGNOSIS — C9110 Chronic lymphocytic leukemia of B-cell type not having achieved remission: Secondary | ICD-10-CM

## 2023-03-12 ENCOUNTER — Other Ambulatory Visit: Payer: Self-pay

## 2023-03-12 ENCOUNTER — Encounter (HOSPITAL_COMMUNITY): Payer: Self-pay

## 2023-03-12 ENCOUNTER — Inpatient Hospital Stay (HOSPITAL_COMMUNITY)
Admission: EM | Admit: 2023-03-12 | Discharge: 2023-03-16 | DRG: 391 | Disposition: A | Discharge status: Home Health | Payer: PPO | Attending: Internal Medicine | Admitting: Internal Medicine

## 2023-03-12 ENCOUNTER — Emergency Department (HOSPITAL_COMMUNITY): Payer: PPO

## 2023-03-12 DIAGNOSIS — G9341 Metabolic encephalopathy: Secondary | ICD-10-CM | POA: Diagnosis present

## 2023-03-12 DIAGNOSIS — E785 Hyperlipidemia, unspecified: Secondary | ICD-10-CM | POA: Diagnosis present

## 2023-03-12 DIAGNOSIS — Z888 Allergy status to other drugs, medicaments and biological substances status: Secondary | ICD-10-CM

## 2023-03-12 DIAGNOSIS — N390 Urinary tract infection, site not specified: Secondary | ICD-10-CM | POA: Diagnosis present

## 2023-03-12 DIAGNOSIS — K449 Diaphragmatic hernia without obstruction or gangrene: Secondary | ICD-10-CM | POA: Diagnosis not present

## 2023-03-12 DIAGNOSIS — K5641 Fecal impaction: Secondary | ICD-10-CM | POA: Diagnosis present

## 2023-03-12 DIAGNOSIS — Z8673 Personal history of transient ischemic attack (TIA), and cerebral infarction without residual deficits: Secondary | ICD-10-CM

## 2023-03-12 DIAGNOSIS — I959 Hypotension, unspecified: Secondary | ICD-10-CM | POA: Diagnosis present

## 2023-03-12 DIAGNOSIS — Z862 Personal history of diseases of the blood and blood-forming organs and certain disorders involving the immune mechanism: Secondary | ICD-10-CM

## 2023-03-12 DIAGNOSIS — E876 Hypokalemia: Secondary | ICD-10-CM | POA: Diagnosis not present

## 2023-03-12 DIAGNOSIS — Z66 Do not resuscitate: Secondary | ICD-10-CM | POA: Diagnosis present

## 2023-03-12 DIAGNOSIS — K5730 Diverticulosis of large intestine without perforation or abscess without bleeding: Secondary | ICD-10-CM | POA: Diagnosis not present

## 2023-03-12 DIAGNOSIS — R5381 Other malaise: Secondary | ICD-10-CM | POA: Diagnosis present

## 2023-03-12 DIAGNOSIS — Z79899 Other long term (current) drug therapy: Secondary | ICD-10-CM

## 2023-03-12 DIAGNOSIS — Z801 Family history of malignant neoplasm of trachea, bronchus and lung: Secondary | ICD-10-CM

## 2023-03-12 DIAGNOSIS — C9111 Chronic lymphocytic leukemia of B-cell type in remission: Secondary | ICD-10-CM | POA: Diagnosis present

## 2023-03-12 DIAGNOSIS — M810 Age-related osteoporosis without current pathological fracture: Secondary | ICD-10-CM | POA: Diagnosis present

## 2023-03-12 DIAGNOSIS — Z823 Family history of stroke: Secondary | ICD-10-CM

## 2023-03-12 DIAGNOSIS — I10 Essential (primary) hypertension: Secondary | ICD-10-CM | POA: Diagnosis present

## 2023-03-12 DIAGNOSIS — K5909 Other constipation: Principal | ICD-10-CM | POA: Diagnosis present

## 2023-03-12 DIAGNOSIS — G40909 Epilepsy, unspecified, not intractable, without status epilepticus: Secondary | ICD-10-CM | POA: Diagnosis present

## 2023-03-12 DIAGNOSIS — K644 Residual hemorrhoidal skin tags: Secondary | ICD-10-CM | POA: Diagnosis present

## 2023-03-12 DIAGNOSIS — B952 Enterococcus as the cause of diseases classified elsewhere: Secondary | ICD-10-CM | POA: Diagnosis present

## 2023-03-12 DIAGNOSIS — Z885 Allergy status to narcotic agent status: Secondary | ICD-10-CM

## 2023-03-12 DIAGNOSIS — E860 Dehydration: Secondary | ICD-10-CM | POA: Diagnosis present

## 2023-03-12 DIAGNOSIS — N83202 Unspecified ovarian cyst, left side: Secondary | ICD-10-CM | POA: Diagnosis present

## 2023-03-12 DIAGNOSIS — R112 Nausea with vomiting, unspecified: Secondary | ICD-10-CM | POA: Diagnosis present

## 2023-03-12 DIAGNOSIS — D849 Immunodeficiency, unspecified: Secondary | ICD-10-CM | POA: Diagnosis present

## 2023-03-12 DIAGNOSIS — F419 Anxiety disorder, unspecified: Secondary | ICD-10-CM | POA: Diagnosis present

## 2023-03-12 LAB — COMPREHENSIVE METABOLIC PANEL
ALT: 22 U/L (ref 0–44)
AST: 31 U/L (ref 15–41)
Albumin: 3.7 g/dL (ref 3.5–5.0)
Alkaline Phosphatase: 77 U/L (ref 38–126)
Anion gap: 13 (ref 5–15)
BUN: 18 mg/dL (ref 8–23)
CO2: 25 mmol/L (ref 22–32)
Calcium: 8.7 mg/dL — ABNORMAL LOW (ref 8.9–10.3)
Chloride: 104 mmol/L (ref 98–111)
Creatinine, Ser: 0.78 mg/dL (ref 0.44–1.00)
GFR, Estimated: >60 mL/min (ref >60)
Glucose, Bld: 103 mg/dL — ABNORMAL HIGH (ref 70–99)
Potassium: 3 mmol/L — ABNORMAL LOW (ref 3.5–5.1)
Sodium: 142 mmol/L (ref 135–145)
Total Bilirubin: 1 mg/dL (ref 0.3–1.2)
Total Protein: 6.6 g/dL (ref 6.5–8.1)

## 2023-03-12 LAB — CBC
HCT: 41.9 % (ref 36.0–46.0)
Hemoglobin: 13.9 g/dL (ref 12.0–15.0)
MCH: 30.6 pg (ref 26.0–34.0)
MCHC: 33.2 g/dL (ref 30.0–36.0)
MCV: 92.3 fL (ref 80.0–100.0)
Platelets: 216 10*3/uL (ref 150–400)
RBC: 4.54 MIL/uL (ref 3.87–5.11)
RDW: 15.7 % — ABNORMAL HIGH (ref 11.5–15.5)
WBC: 9.4 10*3/uL (ref 4.0–10.5)
nRBC: 0 % (ref 0.0–0.2)

## 2023-03-12 LAB — URINALYSIS, ROUTINE W REFLEX MICROSCOPIC
Bilirubin Urine: NEGATIVE
Glucose, UA: NEGATIVE mg/dL
Ketones, ur: 20 mg/dL — AB
Nitrite: NEGATIVE
Protein, ur: NEGATIVE mg/dL
Specific Gravity, Urine: 1.017 (ref 1.005–1.030)
pH: 5 (ref 5.0–8.0)

## 2023-03-12 LAB — MAGNESIUM: Magnesium: 2.3 mg/dL (ref 1.7–2.4)

## 2023-03-12 LAB — LIPASE, BLOOD: Lipase: 30 U/L (ref 11–51)

## 2023-03-12 MED ORDER — IOHEXOL 350 MG/ML SOLN
75.0000 mL | Freq: Once | INTRAVENOUS | Status: AC | PRN
  Administered 2023-03-12: 75 mL via INTRAVENOUS

## 2023-03-12 MED ORDER — ONDANSETRON 4 MG PO TBDP: 4.0000 mg | ORAL_TABLET | Freq: Three times a day (TID) | ORAL | 0 refills | Status: AC | PRN

## 2023-03-12 MED ORDER — ONDANSETRON HCL 4 MG/2ML IJ SOLN
4.0000 mg | Freq: Once | INTRAMUSCULAR | Status: AC
  Administered 2023-03-12: 4 mg via INTRAVENOUS
  Filled 2023-03-12: qty 2

## 2023-03-12 MED ORDER — SODIUM CHLORIDE 0.9 % IV BOLUS
1000.0000 mL | Freq: Once | INTRAVENOUS | Status: AC
  Administered 2023-03-12: 1000 mL via INTRAVENOUS

## 2023-03-12 NOTE — ED Provider Notes (Signed)
[place] [facility] AT [facility] Provider Note   CSN: [id number] Arrival date & time: 03/12/23  1844     History {Add pertinent medical, surgical, social history, OB history to HPI:1} Chief Complaint  Patient presents with   Abdominal Pain   Emesis    Selena Branch is a 82 y.o. female.  Level 5 caveat secondary to altered mental status.  She has a remote history of stroke and had a recent seizure which led to a prolonged hospitalization and rehab stay.  She has been home for a few weeks.  She has been constipated and went to the PCP and then ultimately to [facility] a few days ago, had disimpaction and enemas, CAT scan and labs.  She had a bowel movement but has been vomiting and not wanting to eat.  She cannot identify what is bothering her but she just knows something is wrong.  Daughter said she has been forgetful and confused, intermittent hallucinations since seizure and hospitalization.  The history is provided by the patient and a relative.  Emesis Severity:  Moderate Duration:  2 days Timing:  Intermittent Progression:  Unchanged Chronicity:  New Relieved by:  Nothing Worsened by:  Nothing Ineffective treatments:  None tried Associated symptoms: no fever        Home Medications Prior to Admission medications   Medication Sig Start Date End Date Taking? Authorizing Provider  amLODipine (NORVASC) 10 MG tablet Take 1 tablet (10 mg total) by mouth at bedtime. Patient taking differently: Take 5 mg by mouth at bedtime. 09/20/19   Mikhail, Nita Sells, DO  atorvastatin (LIPITOR) 40 MG tablet Take 40 mg by mouth daily.     [provider]  Brinzolamide-Brimonidine (SIMBRINZA) 1-0.2 % SUSP Place 1 drop into both eyes 2 (two) times daily.    [provider]  Calcium Carb-Cholecalciferol (CALCIUM + VITAMIN D3 PO) Take 1 tablet by mouth daily.    [provider]  carvedilol (COREG) 3.125 MG tablet Take 1 tablet (3.125 mg total) by  mouth 2 (two) times daily with a meal. 02/27/23   Leroy Sea, MD  dorzolamide-timolol (COSOPT) 22.3-6.8 MG/ML ophthalmic solution Place 1 drop into both eyes 2 (two) times daily. 12/10/15   [provider]  eltrombopag (PROMACTA) 25 MG tablet Take 1 tablet (25 mg) on Monday Wednesday and Friday .take on an empty stomach, 1 hour before a meal or 2 hours after. Patient taking differently: Take 25 mg by mouth as directed. Take 1 tablet (25 mg) on Monday, Wednesday, Thursday and Saturday. Take on an empty stomach, 1 hour before a meal or 2 hours after. 01/14/23   Johney Maine, MD  glycerin adult 2 g suppository Place 1 suppository rectally as needed for constipation. 03/10/23   Renne Crigler, PA-C  ibandronate (BONIVA) 150 MG tablet Take 150 mg by mouth every 28 (twenty-eight) days. 10/26/21   [provider]  lacosamide (VIMPAT) 200 MG TABS tablet Take 1 tablet (200 mg total) by mouth 2 (two) times daily. 02/27/23   Leroy Sea, MD  latanoprost (XALATAN) 0.005 % ophthalmic solution Place 1 drop into both eyes at bedtime.  12/10/15   [provider]  lidocaine (XYLOCAINE) 2 % jelly Apply 1 Application topically as needed. 03/10/23   Renne Crigler, PA-C  LORazepam (ATIVAN) 1 MG tablet Take 1 tablet (1 mg total) by mouth every 8 (eight) hours as needed for seizure. 02/27/23 02/27/24  Leroy Sea, MD  Multiple Vitamins-Minerals (WOMENS 50+  MULTI VITAMIN/MIN) TABS Take 1 tablet by mouth daily.    [provider]  pantoprazole (PROTONIX) 40 MG tablet Take 1 tablet (40 mg total) by mouth 2 (two) times daily. 02/27/23   Leroy Sea, MD  polyethylene glycol powder (GLYCOLAX/MIRALAX) 17 GM/SCOOP powder Take 17 g by mouth daily. 03/10/23   Renne Crigler, PA-C  QUEtiapine (SEROQUEL) 25 MG tablet Take 1 tablet (25 mg total) by mouth daily as needed (agitation). 02/28/23   Leroy Sea, MD  QUEtiapine (SEROQUEL) 50 MG tablet Take 1 tablet (50 mg total) by  mouth at bedtime. 02/28/23   Leroy Sea, MD      Allergies    Codeine, Evista [raloxifene], Actonel [risedronate], Boniva [ibandronic acid], Fosamax [alendronate], and Prednisone    Review of Systems   Review of Systems  Unable to perform ROS: Mental status change  Constitutional:  Negative for fever.    Physical Exam Updated Vital Signs BP 135/71 (BP Location: Right Arm)   Pulse 68   Temp 98.5 F (36.9 C) (Oral)   Resp 18   Ht 4\' 10"  (1.473 m)   Wt 46.3 kg   SpO2 96%   BMI 21.32 kg/m  Physical Exam Vitals and nursing note reviewed.  Constitutional:      Appearance: She is well-developed.     Comments: Patient is awake and following commands but has a poor ability to express herself  HENT:     Head: Normocephalic and atraumatic.  Eyes:     Conjunctiva/sclera: Conjunctivae normal.  Cardiovascular:     Rate and Rhythm: Normal rate and regular rhythm.     Heart sounds: No murmur heard. Pulmonary:     Effort: Pulmonary effort is normal. No respiratory distress.     Breath sounds: Normal breath sounds.  Abdominal:     Palpations: Abdomen is soft.     Tenderness: There is no abdominal tenderness. There is no guarding or rebound.  Musculoskeletal:        General: No deformity. Normal range of motion.     Cervical back: Neck supple.  Skin:    General: Skin is warm and dry.     Capillary Refill: Capillary refill takes less than 2 seconds.  Neurological:     General: No focal deficit present.     Motor: No weakness.     ED Results / Procedures / Treatments   Labs (all labs ordered are listed, but only abnormal results are displayed) Labs Reviewed  COMPREHENSIVE METABOLIC PANEL - Abnormal; Notable for the following components:      Result Value   Potassium 3.0 (*)    Glucose, Bld 103 (*)    Calcium 8.7 (*)    All other components within normal limits  CBC - Abnormal; Notable for the following components:   RDW 15.7 (*)    All other components within normal  limits  LIPASE, BLOOD  URINALYSIS, ROUTINE W REFLEX MICROSCOPIC    EKG None  Radiology No results found.  Procedures Procedures  {Document cardiac monitor, telemetry assessment procedure when appropriate:1}  Medications Ordered in ED Medications  sodium chloride 0.9 % bolus 1,000 mL (has no administration in time range)  ondansetron (ZOFRAN) injection 4 mg (has no administration in time range)    ED Course/ Medical Decision Making/ A&P   {   Click here for ABCD2, HEART and other calculatorsREFRESH Note before signing :1}  Medical Decision Making Amount and/or Complexity of Data Reviewed Labs: ordered.  Risk Prescription drug management.   This patient complains of ***; this involves an extensive number of treatment Options and is a complaint that carries with it a high risk of complications and morbidity. The differential includes ***  I ordered, reviewed and interpreted labs, which included *** I ordered medication *** and reviewed PMP when indicated. I ordered imaging studies which included *** and I independently    visualized and interpreted imaging which showed *** Additional history obtained from *** Previous records obtained and reviewed *** I consulted *** and discussed lab and imaging findings and discussed disposition.  Cardiac monitoring reviewed, *** Social determinants considered, *** Critical Interventions: ***  After the interventions stated above, I reevaluated the patient and found *** Admission and further testing considered, ***   {Document critical care time when appropriate:1} {Document review of labs and clinical decision tools ie heart score, Chads2Vasc2 etc:1}  {Document your independent review of radiology images, and any outside records:1} {Document your discussion with family members, caretakers, and with consultants:1} {Document social determinants of health affecting pt's care:1} {Document your decision  making why or why not admission, treatments were needed:1} Final Clinical Impression(s) / ED Diagnoses Final diagnoses:  None    Rx / DC Orders ED Discharge Orders     None

## 2023-03-12 NOTE — ED Triage Notes (Signed)
Pt c/o generalized abdominal pain and n/v x3 days.  Pt was seen at [facility] x2 days ago for same and diagnosed w/ fecal impaction.  Pt's daughter reports Pt had a couple moderate BMs x2 days ago.  Pt has not taken any medication today.

## 2023-03-12 NOTE — ED Provider Notes (Signed)
Care assumed at 2330.  Patient with history of ITP, ICH, CLL, seizure disorder here for evaluation of vomiting.  Care assumed pending CT abdomen pelvis.  CT abdomen pelvis without significant change when compared to previous.  On examination she does not have significant abdominal tenderness.  On attempts to p.o. challenge patient with dry heaving.  She does have hypokalemia.  Will replace with IV potassium but she is unable to tolerate p.o. at this time.  Medicine consulted for admission for persistent nausea and vomiting.   Tilden Fossa, MD 03/13/23 [phone]

## 2023-03-13 DIAGNOSIS — B952 Enterococcus as the cause of diseases classified elsewhere: Secondary | ICD-10-CM | POA: Diagnosis not present

## 2023-03-13 DIAGNOSIS — R4182 Altered mental status, unspecified: Secondary | ICD-10-CM | POA: Diagnosis present

## 2023-03-13 DIAGNOSIS — I959 Hypotension, unspecified: Secondary | ICD-10-CM | POA: Diagnosis not present

## 2023-03-13 DIAGNOSIS — E860 Dehydration: Secondary | ICD-10-CM | POA: Diagnosis not present

## 2023-03-13 DIAGNOSIS — E876 Hypokalemia: Secondary | ICD-10-CM

## 2023-03-13 DIAGNOSIS — Z823 Family history of stroke: Secondary | ICD-10-CM | POA: Diagnosis not present

## 2023-03-13 DIAGNOSIS — K5641 Fecal impaction: Secondary | ICD-10-CM | POA: Diagnosis not present

## 2023-03-13 DIAGNOSIS — Z79899 Other long term (current) drug therapy: Secondary | ICD-10-CM | POA: Diagnosis not present

## 2023-03-13 DIAGNOSIS — E785 Hyperlipidemia, unspecified: Secondary | ICD-10-CM | POA: Diagnosis not present

## 2023-03-13 DIAGNOSIS — R112 Nausea with vomiting, unspecified: Secondary | ICD-10-CM | POA: Diagnosis not present

## 2023-03-13 DIAGNOSIS — C9111 Chronic lymphocytic leukemia of B-cell type in remission: Secondary | ICD-10-CM | POA: Diagnosis not present

## 2023-03-13 DIAGNOSIS — G9341 Metabolic encephalopathy: Secondary | ICD-10-CM | POA: Diagnosis not present

## 2023-03-13 DIAGNOSIS — Z888 Allergy status to other drugs, medicaments and biological substances status: Secondary | ICD-10-CM | POA: Diagnosis not present

## 2023-03-13 DIAGNOSIS — Z8673 Personal history of transient ischemic attack (TIA), and cerebral infarction without residual deficits: Secondary | ICD-10-CM | POA: Diagnosis not present

## 2023-03-13 DIAGNOSIS — Z515 Encounter for palliative care: Secondary | ICD-10-CM

## 2023-03-13 DIAGNOSIS — Z66 Do not resuscitate: Secondary | ICD-10-CM | POA: Diagnosis not present

## 2023-03-13 DIAGNOSIS — F419 Anxiety disorder, unspecified: Secondary | ICD-10-CM | POA: Diagnosis not present

## 2023-03-13 DIAGNOSIS — D849 Immunodeficiency, unspecified: Secondary | ICD-10-CM | POA: Diagnosis not present

## 2023-03-13 DIAGNOSIS — I10 Essential (primary) hypertension: Secondary | ICD-10-CM | POA: Diagnosis not present

## 2023-03-13 DIAGNOSIS — N390 Urinary tract infection, site not specified: Secondary | ICD-10-CM | POA: Diagnosis not present

## 2023-03-13 DIAGNOSIS — K644 Residual hemorrhoidal skin tags: Secondary | ICD-10-CM | POA: Diagnosis not present

## 2023-03-13 DIAGNOSIS — K5909 Other constipation: Secondary | ICD-10-CM | POA: Diagnosis not present

## 2023-03-13 DIAGNOSIS — G40909 Epilepsy, unspecified, not intractable, without status epilepticus: Secondary | ICD-10-CM | POA: Diagnosis not present

## 2023-03-13 DIAGNOSIS — M810 Age-related osteoporosis without current pathological fracture: Secondary | ICD-10-CM | POA: Diagnosis not present

## 2023-03-13 DIAGNOSIS — R5381 Other malaise: Secondary | ICD-10-CM | POA: Diagnosis not present

## 2023-03-13 DIAGNOSIS — Z885 Allergy status to narcotic agent status: Secondary | ICD-10-CM | POA: Diagnosis not present

## 2023-03-13 DIAGNOSIS — Z801 Family history of malignant neoplasm of trachea, bronchus and lung: Secondary | ICD-10-CM | POA: Diagnosis not present

## 2023-03-13 LAB — BASIC METABOLIC PANEL
Anion gap: 11 (ref 5–15)
BUN: 12 mg/dL (ref 8–23)
CO2: 21 mmol/L — ABNORMAL LOW (ref 22–32)
Calcium: 7.4 mg/dL — ABNORMAL LOW (ref 8.9–10.3)
Chloride: 110 mmol/L (ref 98–111)
Creatinine, Ser: 0.69 mg/dL (ref 0.44–1.00)
GFR, Estimated: >60 mL/min (ref >60)
Glucose, Bld: 82 mg/dL (ref 70–99)
Potassium: 3.4 mmol/L — ABNORMAL LOW (ref 3.5–5.1)
Sodium: 142 mmol/L (ref 135–145)

## 2023-03-13 LAB — URINALYSIS, W/ REFLEX TO CULTURE (INFECTION SUSPECTED)
Bacteria, UA: NONE SEEN
Bilirubin Urine: NEGATIVE
Glucose, UA: NEGATIVE mg/dL
Ketones, ur: 20 mg/dL — AB
Nitrite: NEGATIVE
Protein, ur: NEGATIVE mg/dL
Specific Gravity, Urine: 1.035 — ABNORMAL HIGH (ref 1.005–1.030)
pH: 6 (ref 5.0–8.0)

## 2023-03-13 LAB — CBC
HCT: 39.3 % (ref 36.0–46.0)
Hemoglobin: 12.8 g/dL (ref 12.0–15.0)
MCH: 31.1 pg (ref 26.0–34.0)
MCHC: 32.6 g/dL (ref 30.0–36.0)
MCV: 95.4 fL (ref 80.0–100.0)
Platelets: 179 10*3/uL (ref 150–400)
RBC: 4.12 MIL/uL (ref 3.87–5.11)
RDW: 15.9 % — ABNORMAL HIGH (ref 11.5–15.5)
WBC: 6.5 10*3/uL (ref 4.0–10.5)
nRBC: 0 % (ref 0.0–0.2)

## 2023-03-13 LAB — GLUCOSE, CAPILLARY
Glucose-Capillary: 75 mg/dL (ref 70–99)
Glucose-Capillary: 80 mg/dL (ref 70–99)
Glucose-Capillary: 138 mg/dL — ABNORMAL HIGH (ref 70–99)
Glucose-Capillary: 93 mg/dL (ref 70–99)

## 2023-03-13 LAB — URINE CULTURE

## 2023-03-13 LAB — LACTIC ACID, PLASMA: Lactic Acid, Venous: 0.8 mmol/L (ref 0.5–1.9)

## 2023-03-13 MED ORDER — SODIUM CHLORIDE 0.9 % IV BOLUS
1000.0000 mL | INTRAVENOUS | Status: AC
  Administered 2023-03-13: 1000 mL via INTRAVENOUS

## 2023-03-13 MED ORDER — ATORVASTATIN CALCIUM 40 MG PO TABS
40.0000 mg | ORAL_TABLET | Freq: Every day | ORAL | Status: DC
  Administered 2023-03-14 – 2023-03-16 (×3): 40 mg via ORAL
  Filled 2023-03-13 (×3): qty 1

## 2023-03-13 MED ORDER — PANTOPRAZOLE SODIUM 40 MG PO TBEC
40.0000 mg | DELAYED_RELEASE_TABLET | Freq: Two times a day (BID) | ORAL | Status: DC
  Administered 2023-03-14 – 2023-03-16 (×5): 40 mg via ORAL
  Filled 2023-03-13 (×5): qty 1

## 2023-03-13 MED ORDER — POTASSIUM CHLORIDE 2 MEQ/ML IV SOLN
INTRAVENOUS | Status: AC
  Filled 2023-03-13 (×3): qty 1000

## 2023-03-13 MED ORDER — POTASSIUM CHLORIDE 10 MEQ/100ML IV SOLN
10.0000 meq | Freq: Once | INTRAVENOUS | Status: AC
  Administered 2023-03-13: 10 meq via INTRAVENOUS
  Filled 2023-03-13: qty 100

## 2023-03-13 MED ORDER — POTASSIUM CHLORIDE 10 MEQ/100ML IV SOLN
10.0000 meq | INTRAVENOUS | Status: AC
  Administered 2023-03-13 (×2): 10 meq via INTRAVENOUS
  Filled 2023-03-13 (×2): qty 100

## 2023-03-13 MED ORDER — ONDANSETRON HCL 4 MG/2ML IJ SOLN
4.0000 mg | Freq: Once | INTRAMUSCULAR | Status: AC
  Administered 2023-03-13: 4 mg via INTRAVENOUS
  Filled 2023-03-13: qty 2

## 2023-03-13 MED ORDER — LACOSAMIDE 50 MG PO TABS: 200.0000 mg | ORAL_TABLET | Freq: Two times a day (BID) | ORAL | Status: DC

## 2023-03-13 MED ORDER — QUETIAPINE FUMARATE 25 MG PO TABS
25.0000 mg | ORAL_TABLET | Freq: Every evening | ORAL | Status: DC | PRN
  Administered 2023-03-13 – 2023-03-14 (×2): 25 mg via ORAL
  Filled 2023-03-13 (×2): qty 1

## 2023-03-13 MED ORDER — BOOST / RESOURCE BREEZE PO LIQD CUSTOM
1.0000 | Freq: Three times a day (TID) | ORAL | Status: DC
  Administered 2023-03-14 – 2023-03-15 (×5): 1 via ORAL

## 2023-03-13 MED ORDER — LORAZEPAM 2 MG/ML IJ SOLN: 2.0000 mg | Freq: Four times a day (QID) | INTRAMUSCULAR | Status: DC | PRN

## 2023-03-13 MED ORDER — ELTROMBOPAG OLAMINE 25 MG PO TABS: 25.0000 mg | ORAL_TABLET | ORAL | Status: DC

## 2023-03-13 MED ORDER — SODIUM CHLORIDE 0.9 % IV SOLN
2.0000 g | INTRAVENOUS | Status: DC
  Administered 2023-03-13 – 2023-03-14 (×2): 2 g via INTRAVENOUS
  Filled 2023-03-13 (×2): qty 20

## 2023-03-13 MED ORDER — ACETAMINOPHEN 325 MG PO TABS: 650.0000 mg | ORAL_TABLET | Freq: Four times a day (QID) | ORAL | Status: DC | PRN

## 2023-03-13 MED ORDER — PROCHLORPERAZINE EDISYLATE 10 MG/2ML IJ SOLN
5.0000 mg | Freq: Four times a day (QID) | INTRAMUSCULAR | Status: DC | PRN
  Administered 2023-03-13: 5 mg via INTRAVENOUS
  Filled 2023-03-13: qty 2

## 2023-03-13 MED ORDER — SODIUM CHLORIDE 0.9 % IV SOLN
200.0000 mg | Freq: Two times a day (BID) | INTRAVENOUS | Status: DC
  Administered 2023-03-13 – 2023-03-16 (×7): 200 mg via INTRAVENOUS
  Filled 2023-03-13 (×9): qty 20

## 2023-03-13 MED ORDER — BISACODYL 10 MG RE SUPP
10.0000 mg | Freq: Every day | RECTAL | Status: DC | PRN
  Administered 2023-03-13: 10 mg via RECTAL
  Filled 2023-03-13: qty 1

## 2023-03-13 MED ORDER — ADULT MULTIVITAMIN W/MINERALS CH
1.0000 | ORAL_TABLET | Freq: Every day | ORAL | Status: DC
  Administered 2023-03-14 – 2023-03-16 (×3): 1 via ORAL
  Filled 2023-03-13 (×3): qty 1

## 2023-03-13 MED ORDER — SENNOSIDES-DOCUSATE SODIUM 8.6-50 MG PO TABS
1.0000 | ORAL_TABLET | Freq: Two times a day (BID) | ORAL | Status: DC
  Administered 2023-03-14 – 2023-03-16 (×5): 1 via ORAL
  Filled 2023-03-13 (×5): qty 1

## 2023-03-13 MED ORDER — LACOSAMIDE 50 MG PO TABS
200.0000 mg | ORAL_TABLET | Freq: Once | ORAL | Status: AC
  Administered 2023-03-13: 200 mg via ORAL
  Filled 2023-03-13: qty 4

## 2023-03-13 MED ORDER — POTASSIUM CHLORIDE 2 MEQ/ML IV SOLN
INTRAVENOUS | Status: DC
  Filled 2023-03-13: qty 1000

## 2023-03-13 MED ORDER — ONDANSETRON HCL 4 MG/2ML IJ SOLN
4.0000 mg | Freq: Four times a day (QID) | INTRAMUSCULAR | Status: DC | PRN
  Administered 2023-03-13 (×2): 4 mg via INTRAVENOUS
  Filled 2023-03-13 (×2): qty 2

## 2023-03-13 MED ORDER — POLYETHYLENE GLYCOL 3350 17 G PO PACK
17.0000 g | PACK | Freq: Every day | ORAL | Status: DC
  Administered 2023-03-14 – 2023-03-16 (×3): 17 g via ORAL
  Filled 2023-03-13 (×3): qty 1

## 2023-03-13 MED ORDER — SODIUM CHLORIDE 0.9 % IV BOLUS: 500.0000 mL | INTRAVENOUS | Status: DC

## 2023-03-13 NOTE — Evaluation (Signed)
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 No pertinent surgical history. HPI:  Patient is an 82 y.o. female with PMH: HTN, HLD, recent admit for seizures with aphasia from 5/24-6/17/24. She was discharged to SNF from hospital on 6/17 and disharged from SNF to home with family a few days prior to current admission. She recently presented to the ED on 03/10/23 with c/o constipation which was confirmed on CT scan. She returned to ED on 03/12/23  with c/o nausea and vomiting and family reporting she has not tolerated any oral intake. She was lethargic in ED. She become hypotensive when being transferred from ED to unit and continued to be lethargic. She was made NPO until more alert and after SLP swallow evaluation.    Assessment / Plan / Recommendation  Clinical Impression  Patient is presnting with questionable, mild oropharyngeal dysphagia as per this bedside swallow evaluation. Aside from suspected, mild delay in initiation of swallow, patient did not exhibit any other overt s/s. No overt s/s aspiration observed. Patient did frequently feel nauseated but even when she turned to emesis bag next to her, no wretching or vomiting observed. Daughter reported that Friday (6/28) was the most recent time that patient actually vomited. Of note, patient is very alert, able to communicate without difficulty with SLP and family which is a significant improvement from how she was during previous hospitalization. SLP recommending initiate PO diet of full liquids(thin  consistency). SLP Visit Diagnosis: Dysphagia, unspecified (R13.10)    Aspiration Risk  Mild aspiration risk    Diet Recommendation Thin liquid;Other (Comment) (full liquids)    Medication Administration: Whole meds with liquid Supervision: Patient able to self feed;Staff to assist with self feeding Compensations: Minimize environmental distractions;Slow rate;Small sips/bites Postural Changes: Seated upright at 90 degrees;Remain upright for at least 30 minutes after po intake    Other  Recommendations Oral Care Recommendations: Oral care BID    Recommendations for follow up therapy are one component of a multi-disciplinary discharge planning process, led by the attending physician.  Recommendations may be updated based on patient status, additional functional criteria and insurance authorization.  Follow up Recommendations Follow physician's recommendations for discharge plan and follow up therapies      Assistance Recommended at Discharge    Functional Status Assessment Patient has had a recent decline in their functional status and demonstrates the ability to make significant improvements in function in a reasonable and predictable amount of time.  Frequency and Duration min 2x/week  1 week       Prognosis Prognosis for improved oropharyngeal function: Good      Swallow Study   General HPI: Patient is an 82 y.o. female with PMH: HTN, HLD, recent admit for seizures with aphasia from 5/24-6/17/24. She was discharged to SNF from hospital on 6/17 and disharged from SNF to home with family a few days prior to current admission. She recently presented to the ED on 03/10/23 with c/o constipation which was confirmed on CT scan. She returned to ED on 03/12/23  with c/o  nausea and vomiting and family reporting she has not tolerated any oral intake. She was lethargic in ED. She become hypotensive when being transferred from ED to unit and continued to be lethargic. She was made NPO until more  alert and after SLP swallow evaluation. Type of Study: Bedside Swallow Evaluation Previous Swallow Assessment: during recent previous admission Diet Prior to this Study: NPO Temperature Spikes Noted: No Respiratory Status: Room air History of Recent Intubation: No Behavior/Cognition: Alert;Cooperative;Pleasant mood Oral Cavity Assessment: Within Functional Limits Oral Cavity - Dentition: Adequate natural dentition Vision: Functional for self-feeding Self-Feeding Abilities: Able to feed self;Needs set up Patient Positioning: Upright in bed Baseline Vocal Quality: Normal Volitional Cough: Cognitively unable to elicit Volitional Swallow: Unable to elicit    Oral/Motor/Sensory Function Overall Oral Motor/Sensory Function: Other (comment) (mandibular, facial tremor)   Ice Chips     Thin Liquid Thin Liquid: Impaired Presentation: Straw;Cup;Self Fed Pharyngeal  Phase Impairments: Suspected delayed Swallow    Nectar Thick     Honey Thick     Puree Puree: Not tested   Solid     Solid: Within functional limits Presentation: Spoon Other Comments: (gelatin)      Angela Nevin, MA, CCC-SLP Speech Therapy

## 2023-03-13 NOTE — ED Notes (Signed)
**Note Selena-Identified via Obfuscation** ED TO INPATIENT HANDOFF REPORT  ED Nurse Name and Phone #: Manus Gunning, RN   S Name/Age/Gender Selena Branch 82 y.o. female Room/Bed: RESUSC/RESUSC  Code Status   Code Status: DNR  Home/SNF/Other Home Patient oriented to: self, place, time, and situation Is this baseline? Yes   Triage Complete: Triage complete  Chief Complaint Intractable nausea and vomiting [R11.2]  Triage Note Pt c/o generalized abdominal pain and n/v x3 days.  Pt was seen at [facility] x2 days ago for same and diagnosed w/ fecal impaction.  Pt's daughter reports Pt had a couple moderate BMs x2 days ago.  Pt has not taken any medication today.     Allergies Allergies  Allergen Reactions   Codeine Nausea Only   Evista [Raloxifene] Other (See Comments)    Leg cramps   Actonel [Risedronate] Other (See Comments)    GI intolerance   Boniva [Ibandronic Acid] Other (See Comments)    GI intolerance   Fosamax [Alendronate] Other (See Comments)    GI intolerance   Prednisone Other (See Comments)    Aggression Irritability    Level of Care/Admitting Diagnosis ED Disposition     ED Disposition  Admit   Condition  --   Comment  Hospital Area: [facility] [[id number]]  Level of Care: Telemetry Surgical [[id number]]  May admit patient to [facility] or [facility] if equivalent level of care is available:: Yes  Covid Evaluation: Asymptomatic - no recent exposure (last 10 days) testing not required  Diagnosis: Intractable nausea and vomiting [720114]  Admitting Physician: Darlin Drop [[id number]]  Attending Physician: Darlin Drop [[id number]]  Certification:: I certify this patient will need inpatient services for at least 2 midnights  Estimated Length of Stay: 2          B Medical/Surgery History Past Medical History:  Diagnosis Date   Anxiety    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 No  pertinent surgical history.   A IV Location/Drains/Wounds Patient Lines/Drains/Airways Status     Active Line/Drains/Airways     Name Placement date Placement time Site Days   Peripheral IV 03/12/23 20 G 1" Anterior;Left Forearm 03/12/23  2124  Forearm  1            Intake/Output Last 24 hours  Intake/Output Summary (Last 24 hours) at 03/13/2023 0410 Last data filed at 03/12/2023 2201 Gross per 24 hour  Intake --  Output 600 ml  Net -600 ml    Labs/Imaging Results for orders placed or performed during the hospital encounter of 03/12/23 (from the past 48 hour(s))  Lipase, blood     Status: None   Collection Time: 03/12/23  7:04 PM  Result Value Ref Range   Lipase 30 11 - 51 U/L    Comment: Performed at [facility], [address], [place], [place] [address]  Comprehensive metabolic panel     Status: Abnormal   Collection Time: 03/12/23  7:04 PM  Result Value Ref Range   Sodium 142 135 - 145 mmol/L   Potassium 3.0 (L) 3.5 - 5.1 mmol/L   Chloride 104 98 - 111 mmol/L   CO2 25 22 - 32 mmol/L   Glucose, Bld 103 (H) 70 - 99 mg/dL    Comment: Glucose reference range applies only to samples taken after fasting for at least 8 hours.   BUN 18  8 - 23 mg/dL   Creatinine, Ser 8.11 0.44 - 1.00 mg/dL   Calcium 8.7 (L) 8.9 - 10.3 mg/dL   Total Protein 6.6 6.5 - 8.1 g/dL   Albumin 3.7 3.5 - 5.0 g/dL   AST 31 15 - 41 U/L   ALT 22 0 - 44 U/L   Alkaline Phosphatase 77 38 - 126 U/L   Total Bilirubin 1.0 0.3 - 1.2 mg/dL   GFR, Estimated >91 >47 mL/min    Comment: (NOTE) Calculated using the CKD-EPI Creatinine Equation (2021)    Anion gap 13 5 - 15    Comment: Performed at [facility], [address] [place], [place] [address]  CBC     Status: Abnormal   Collection Time: 03/12/23  7:04 PM  Result Value Ref Range   WBC 9.4 4.0 - 10.5 K/uL   RBC 4.54 3.87 - 5.11 MIL/uL   Hemoglobin 13.9 12.0 - 15.0 g/dL   HCT 21.3 08.6 - 57.8 %   MCV 92.3 80.0 - 100.0 fL   MCH  30.6 26.0 - 34.0 pg   MCHC 33.2 30.0 - 36.0 g/dL   RDW 46.9 (H) 62.9 - 52.8 %   Platelets 216 150 - 400 K/uL   nRBC 0.0 0.0 - 0.2 %    Comment: Performed at [facility], [address], [place], [place] [address]  Magnesium     Status: None   Collection Time: 03/12/23  9:23 PM  Result Value Ref Range   Magnesium 2.3 1.7 - 2.4 mg/dL    Comment: Performed at [facility], [address], [place], [place] [address]  Urinalysis, Routine w reflex microscopic -Urine, Clean Catch     Status: Abnormal   Collection Time: 03/12/23  9:50 PM  Result Value Ref Range   Color, Urine YELLOW YELLOW   APPearance CLEAR CLEAR   Specific Gravity, Urine 1.017 1.005 - 1.030   pH 5.0 5.0 - 8.0   Glucose, UA NEGATIVE NEGATIVE mg/dL   Hgb urine dipstick SMALL (A) NEGATIVE   Bilirubin Urine NEGATIVE NEGATIVE   Ketones, ur 20 (A) NEGATIVE mg/dL   Protein, ur NEGATIVE NEGATIVE mg/dL   Nitrite NEGATIVE NEGATIVE   Leukocytes,Ua TRACE (A) NEGATIVE   RBC / HPF 0-5 0 - 5 RBC/hpf   WBC, UA 11-20 0 - 5 WBC/hpf   Bacteria, UA RARE (A) NONE SEEN   Squamous Epithelial / HPF 0-5 0 - 5 /HPF   Mucus PRESENT    Hyaline Casts, UA PRESENT     Comment: Performed at [facility], [address], [place], [place] [address]   CT ABDOMEN PELVIS W CONTRAST  Result Date: 03/12/2023 CLINICAL DATA:  Acute abdominal pain EXAM: CT ABDOMEN AND PELVIS WITH CONTRAST TECHNIQUE: Multidetector CT imaging of the abdomen and pelvis was performed using the standard protocol following bolus administration of intravenous contrast. RADIATION DOSE REDUCTION: This exam was performed according to the departmental dose-optimization program which includes automated exposure control, adjustment of the mA and/or kV according to patient size and/or use of iterative reconstruction technique. CONTRAST:  75mL OMNIPAQUE IOHEXOL 350 MG/ML SOLN COMPARISON:  03/10/2023 and 02/13/2023 FINDINGS: Lower chest: Mild dependent atelectasis in the  bilateral lower lobes. Hepatobiliary: Liver is within normal limits. Vicarious excretion of contrast in the gallbladder. No intrahepatic or extrahepatic duct dilatation. Pancreas: Within normal limits. Spleen: Within normal limits. Adrenals/Urinary Tract: Adrenal glands are within normal limits. Kidneys are within normal limits.  No hydronephrosis.  Bladder is within normal limits. Stomach/Bowel: Stomach is notable for a tiny hiatal hernia. No evidence of bowel obstruction. Normal appendix (series 4/image 59) Left colonic diverticulosis, without evidence of diverticulitis. Moderate colonic stool burden, suggesting mild constipation. Vascular/Lymphatic: No evidence of abdominal aortic aneurysm. Atherosclerotic calcifications of the abdominal aorta and branch vessels. No suspicious abdominopelvic lymphadenopathy. Reproductive: Uterus is within normal limits. 6.7 x 5.7 cm left ovarian/adnexal cyst, chronic. No right adnexal mass. Other: No abdominopelvic ascites. 3.2 x 2.6 cm hematoma beneath the right rectus sheath (series 4/image 61), grossly unchanged. Additional 15 mm fluid collection in the right obturator fossa (series 4/image 89), unchanged. Additional 12 mm possible fluid collection beneath the left lower pelvic wall anterior to the bladder (series 4/image 74). These are all unchanged from recent CT but improved from 02/13/2023. Musculoskeletal: Degenerative changes of the visualized thoracolumbar spine. IMPRESSION: No interval change from recent CT. Multiple pelvic fluid collections, as described above, reflecting involving hematomas. 6.7 cm left ovarian/adnexal cyst, chronic. Consider annual follow-up as clinically warranted. Electronically Signed   By: Charline Bills M.D.   On: 03/12/2023 23:58    Pending Labs Unresulted Labs (From admission, onward)    None       Vitals/Pain Today's Vitals   03/13/23 0112 03/13/23 0130 03/13/23 0215 03/13/23 0400  BP:  103/64 (!) 122/54 (!) 125/50  Pulse:   67 67 72  Resp:  14 16 13   Temp: 97.6 F (36.4 C)     TempSrc: Oral     SpO2:  96% 96% 96%  Weight:      Height:        Isolation Precautions No active isolations  Medications Medications  ondansetron (ZOFRAN) injection 4 mg (has no administration in time range)  sodium chloride 0.9 % bolus 1,000 mL (0 mLs Intravenous Stopped 03/12/23 2202)  ondansetron (ZOFRAN) injection 4 mg (4 mg Intravenous Given 03/12/23 2128)  iohexol (OMNIPAQUE) 350 MG/ML injection 75 mL (75 mLs Intravenous Contrast Given 03/12/23 2336)  potassium chloride 10 mEq in 100 mL IVPB (0 mEq Intravenous Stopped 03/13/23 0408)  lacosamide (VIMPAT) tablet 200 mg (200 mg Oral Given 03/13/23 0234)    Mobility walks     Focused Assessments     R Recommendations: See Admitting Provider Note  Report given to:   Additional Notes:

## 2023-03-13 NOTE — Evaluation (Signed)
Physical Therapy Evaluation Patient Details Name: Selena Branch MRN: [id number] DOB: 1941-08-10 Today's Date: 03/13/2023  History of Present Illness  Pt is an 82 y/o F presenting to ED On 6/29 with abdominal pain and constipation, admitted for intractible n/v. 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  Clinical Impression   Pt admitted with above diagnosis. Pt reports having assist at baseline from family who is present close to 24/7, also lives with spouse who can assist. Pt currently needing min-mod A for ADLs, min guard for bed mobility and min A +2 for transfers with RW, and min assist to walk in the hallway with RW; Pt needing min cues for command follow/some repetition during session, pt with BUE tremoring movements and using R >L UE ; Presents to PT with functional dependencies;  Pt currently with functional limitations due to the deficits listed below (see PT Problem List). Pt will benefit from skilled PT to increase their independence and safety with mobility to allow discharge to the venue listed below.          Recommendations for follow up therapy are one component of a multi-disciplinary discharge planning process, led by the attending physician.  Recommendations may be updated based on patient status, additional functional criteria and insurance authorization.  Follow Up Recommendations Can patient physically be transported by private vehicle: Yes     Assistance Recommended at Discharge Frequent or constant Supervision/Assistance  Patient can return home with the following  A lot of help with walking and/or transfers;A lot of help with bathing/dressing/bathroom;Direct supervision/assist for medications management;Direct supervision/assist for financial management;Assistance with cooking/housework;Assist for transportation;Help with  stairs or ramp for entrance    Equipment Recommendations Rolling walker (2 wheels) (Youth sized if she doesn't already have one)  Recommendations for Other Services       Functional Status Assessment Patient has had a recent decline in their functional status and demonstrates the ability to make significant improvements in function in a reasonable and predictable amount of time.     Precautions / Restrictions Precautions Precautions: Fall Precaution Comments: seizure Restrictions Weight Bearing Restrictions: No      Mobility  Bed Mobility Overal bed mobility: Needs Assistance Bed Mobility: Supine to Sit     Supine to sit: Min guard          Transfers Overall transfer level: Needs assistance Equipment used: Rolling walker (2 wheels) Transfers: Sit to/from Stand Sit to Stand: Min assist, +2 safety/equipment           General transfer comment: Cues for hand placement and safety    Ambulation/Gait Ambulation/Gait assistance: Min assist Gait Distance (Feet): 100 Feet Assistive device: Rolling walker (2 wheels) (Youth sized) Gait Pattern/deviations: Step-through pattern, Decreased stride length, Shuffle, Trunk flexed Gait velocity: decr     General Gait Details: short steps, with cues for psoture and to attend to environment around pt  Stairs            Wheelchair Mobility    Modified Rankin (Stroke Patients Only)       Balance Overall balance assessment: Needs assistance Sitting-balance support: Feet supported Sitting balance-Leahy Scale: Fair     Standing balance support: Bilateral upper extremity supported, Reliant on assistive device for balance Standing balance-Leahy Scale: Poor  Pertinent Vitals/Pain Pain Assessment Pain Assessment: No/denies pain    Home Living Family/patient expects to be discharged to:: Private residence Living Arrangements: Spouse/significant other Available Help at  Discharge: Family;Available 24 hours/day (lives with spouse but family has increased to 24/7 assist) Type of Home: House Home Access: Stairs to enter   Secretary/administrator of Steps: 2   Home Layout: One level Home Equipment: Agricultural consultant (2 wheels)      Prior Function Prior Level of Function : Needs assist             Mobility Comments: was using RW and furniture walking ADLs Comments: assist for LB ADLs, household ambulation, family assists with med mgmt     Hand Dominance   Dominant Hand: Right    Extremity/Trunk Assessment   Upper Extremity Assessment Upper Extremity Assessment: Defer to OT evaluation RUE Deficits / Details: tremoring noted LUE Deficits / Details: does not use LUE to assist RUE for fine motor/bilateral aspects of task, functional for grip on RW, tremoring noted    Lower Extremity Assessment Lower Extremity Assessment: Generalized weakness    Cervical / Trunk Assessment Cervical / Trunk Assessment: Kyphotic  Communication   Communication: Expressive difficulties (hx of aphasia)  Cognition Arousal/Alertness: Awake/alert Behavior During Therapy: WFL for tasks assessed/performed Overall Cognitive Status: Impaired/Different from baseline                       Memory: Decreased short-term memory Following Commands: Follows one step commands with increased time       General Comments: increased time and increased repetition for commands        General Comments General comments (skin integrity, edema, etc.): VSS on RA    Exercises     Assessment/Plan    PT Assessment Patient needs continued PT services  PT Problem List Decreased strength;Decreased activity tolerance;Decreased balance;Decreased mobility;Decreased coordination;Decreased cognition;Decreased knowledge of use of DME;Decreased safety awareness;Decreased knowledge of precautions       PT Treatment Interventions DME instruction;Gait training;Stair  training;Functional mobility training;Therapeutic activities;Therapeutic exercise;Balance training;Neuromuscular re-education;Patient/family education;Cognitive remediation    PT Goals (Current goals can be found in the Care Plan section)  Acute Rehab PT Goals Patient Stated Goal: did not specifically state; agrees to getting up and walking PT Goal Formulation: Patient unable to participate in goal setting Time For Goal Achievement: 03/27/23 Potential to Achieve Goals: Good    Frequency Min 3X/week     Co-evaluation PT/OT/SLP Co-Evaluation/Treatment: Yes Reason for Co-Treatment: For patient/therapist safety PT goals addressed during session: Mobility/safety with mobility;Proper use of DME OT goals addressed during session: ADL's and self-care;Strengthening/ROM       AM-PAC PT "6 Clicks" Mobility  Outcome Measure Help needed turning from your back to your side while in a flat bed without using bedrails?: A Little Help needed moving from lying on your back to sitting on the side of a flat bed without using bedrails?: A Little Help needed moving to and from a bed to a chair (including a wheelchair)?: A Lot Help needed standing up from a chair using your arms (e.g., wheelchair or bedside chair)?: A Lot Help needed to walk in hospital room?: A Little Help needed climbing 3-5 steps with a railing? : A Lot 6 Click Score: 15    End of Session Equipment Utilized During Treatment: Gait belt Activity Tolerance: Patient tolerated treatment well Patient left: with call bell/phone within reach;in bed;with bed alarm set Nurse Communication: Mobility status PT Visit Diagnosis: Muscle weakness (generalized) (  M62.81);Other abnormalities of gait and mobility (R26.89);Unsteadiness on feet (R26.81)    Time: 8119-1478 PT Time Calculation (min) (ACUTE ONLY): 27 min   Charges:   PT Evaluation $PT Eval Moderate Complexity: 1 Mod          Van Clines, PT  Acute Rehabilitation  Services Office [phone] Secure Chat welcomed   Levi Aland 03/13/2023, 5:45 PM

## 2023-03-13 NOTE — Hospital Course (Addendum)
82 year old female from home history of CLL intracranial hemorrhage ITP admitted late May for seizure and aphasia 5/25 - 6/17 and discharged to SNF, returned to home few days ago and complaining of persistent nausea, vomiting x 3 days PTA, recently seen in the ED for same on 6/27 with constipation and had CT scan discharged home. Patient has not been able to keep anything orally down Since her diagnosis.  She has not visualized sensation and takes Seroquel In the ED BP stable not hypoxic Labs reviewed WBC 9.4  hemoglobin 13.9, platelet count 216.  Serum potassium 3.0, glucose 103.  Magnesium 2.3.  Renal function is stable lactic acid normal CT abdomen pelvis with contrast>> no change from recent CT multiple pelvic fluid collection reflecting evolving hematomas 6.7 cm left ovarian/adnexal cyst chronic advise annual follow-up as clinically warranted. Patient is admitted for intermittent nausea vomiting and constipation along with metabolic encephalopathy Palliative consulted- DNR, no feeding tube and treat what is treatable. SLP evaluated-added CLD>Advanced to DYS 3 (MSD) Urine cx growing E fecalis

## 2023-03-13 NOTE — H&P (Addendum)
History and Physical  Selena Branch:[id number] DOB: 03/21/1941 DOA: 03/12/2023  Referring physician: Dr. Madilyn Hook, EDP  PCP: Gweneth Dimitri, MD  Outpatient Specialists: Hematology Patient coming from: Home.  Chief Complaint: Nausea vomiting  HPI: Selena Branch is a 82 y.o. female with medical history significant for CLL, currently in remission, hypertension, hyperlipidemia, history of ITP on Promacta, history of intracranial hemorrhage, recent admission for seizure and aphasia from 02/04/2023 until 02/28/2023, discharged to SNF and returned to her home a few days ago, presents from home with complaints of persistent nausea and vomiting for the last 3 days.  Patient recently presented to the ED on 03/10/2023 with complaints of constipation which was also confirmed on CT scan.  In the ED today, she is lethargic although afebrile with no leukocytosis.  Lab studies notable for hypokalemia, repleted intravenously.  Magnesium 2.3.  Per family members, daughter and husband at bedside, the patient has not been able to tolerate any oral intake.  Due to concern for intractable nausea and vomiting, EDP requested admission.  The patient received IV fluid bolus NS 1 L x 1, IV KCl 10 mill equivalent x 1, p.o. Vimpat 200 mg x 1, IV Zofran 4 mg x 1.  UA with mild pyuria.  The patient was admitted by [facility], hospitalist service, to telemetry surgical unit as inpatient status due to intractable nausea and vomiting.  While being transferred to the unit from the ED she became hypotensive with MAP of 53.  1 L IV fluid NS bolus was ordered to be administered.  Seen at her bedside in the unit.  The patient continues to be lethargic.  Transferred to progressive care unit for close monitoring.  Updated the patient's daughter and husband at bedside.  Per her daughter at bedside since her diagnosis of seizure, she has had visual hallucinations for which she takes Seroquel, she is uncertain whether Seroquel has been helpful to the  patient.  ED Course: Temperature 97.6.  BP 125/50, pulse 72, respiratory 13, saturation 96% on room air.  Lab studies remarkable for WBC 9.4, hemoglobin 13.9, platelet count 216.  Serum potassium 3.0, glucose 103.  Magnesium 2.3.  Review of Systems: Review of systems as noted in the HPI. All other systems reviewed and are negative.   Past Medical History:  Diagnosis Date   Anxiety    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 No pertinent surgical history.  Social History:  reports that she has never smoked. She has never used smokeless tobacco. She reports that she does not drink alcohol and does not use drugs.   Allergies  Allergen Reactions   Codeine Nausea Only   Evista [Raloxifene] Other (See Comments)    Leg cramps   Actonel [Risedronate] Other (See Comments)    GI intolerance   Boniva [Ibandronic Acid] Other (See Comments)    GI intolerance   Fosamax [Alendronate] Other (See Comments)    GI intolerance   Prednisone Other (See Comments)    Aggression Irritability    Family History  Problem Relation Age of Onset   Stroke Mother    Lung cancer Father    Leukemia Neg Hx    Breast cancer Neg Hx    Ovarian cancer Neg Hx    Colon cancer Neg Hx    Endometrial cancer Neg Hx    Prostate cancer Neg Hx    Pancreatic cancer  Neg Hx       Prior to Admission medications   Medication Sig Start Date End Date Taking? Authorizing Provider  ondansetron (ZOFRAN-ODT) 4 MG disintegrating tablet Take 1 tablet (4 mg total) by mouth every 8 (eight) hours as needed for nausea or vomiting. 03/12/23  Yes Terrilee Files, MD  amLODipine (NORVASC) 10 MG tablet Take 1 tablet (10 mg total) by mouth at bedtime. Patient taking differently: Take 5 mg by mouth at bedtime. 09/20/19   Mikhail, Nita Sells, DO  atorvastatin (LIPITOR) 40 MG tablet Take 40 mg by mouth daily.     [provider]   Brinzolamide-Brimonidine (SIMBRINZA) 1-0.2 % SUSP Place 1 drop into both eyes 2 (two) times daily.    [provider]  Calcium Carb-Cholecalciferol (CALCIUM + VITAMIN D3 PO) Take 1 tablet by mouth daily.    [provider]  carvedilol (COREG) 3.125 MG tablet Take 1 tablet (3.125 mg total) by mouth 2 (two) times daily with a meal. 02/27/23   Leroy Sea, MD  dorzolamide-timolol (COSOPT) 22.3-6.8 MG/ML ophthalmic solution Place 1 drop into both eyes 2 (two) times daily. 12/10/15   [provider]  eltrombopag (PROMACTA) 25 MG tablet Take 1 tablet (25 mg) on Monday Wednesday and Friday .take on an empty stomach, 1 hour before a meal or 2 hours after. Patient taking differently: Take 25 mg by mouth as directed. Take 1 tablet (25 mg) on Monday, Wednesday, Thursday and Saturday. Take on an empty stomach, 1 hour before a meal or 2 hours after. 01/14/23   Johney Maine, MD  glycerin adult 2 g suppository Place 1 suppository rectally as needed for constipation. 03/10/23   Renne Crigler, PA-C  ibandronate (BONIVA) 150 MG tablet Take 150 mg by mouth every 28 (twenty-eight) days. 10/26/21   [provider]  lacosamide (VIMPAT) 200 MG TABS tablet Take 1 tablet (200 mg total) by mouth 2 (two) times daily. 02/27/23   Leroy Sea, MD  latanoprost (XALATAN) 0.005 % ophthalmic solution Place 1 drop into both eyes at bedtime.  12/10/15   [provider]  lidocaine (XYLOCAINE) 2 % jelly Apply 1 Application topically as needed. 03/10/23   Renne Crigler, PA-C  LORazepam (ATIVAN) 1 MG tablet Take 1 tablet (1 mg total) by mouth every 8 (eight) hours as needed for seizure. 02/27/23 02/27/24  Leroy Sea, MD  Multiple Vitamins-Minerals (WOMENS 50+ MULTI VITAMIN/MIN) TABS Take 1 tablet by mouth daily.    [provider]  pantoprazole (PROTONIX) 40 MG tablet Take 1 tablet (40 mg total) by mouth 2 (two) times daily. 02/27/23   Leroy Sea, MD   polyethylene glycol powder (GLYCOLAX/MIRALAX) 17 GM/SCOOP powder Take 17 g by mouth daily. 03/10/23   Renne Crigler, PA-C  QUEtiapine (SEROQUEL) 25 MG tablet Take 1 tablet (25 mg total) by mouth daily as needed (agitation). 02/28/23   Leroy Sea, MD  QUEtiapine (SEROQUEL) 50 MG tablet Take 1 tablet (50 mg total) by mouth at bedtime. 02/28/23   Leroy Sea, MD    Physical Exam: BP (!) 122/54   Pulse 67   Temp 97.6 F (36.4 C) (Oral)   Resp 16   Ht 4\' 10"  (1.473 m)   Wt 46.3 kg   SpO2 96%   BMI 21.32 kg/m   General: 82 y.o. year-old female Frail-appearing in no acute distress.  Lethargic. Cardiovascular: Regular rate and rhythm with no rubs or gallops.  No thyromegaly or JVD noted.  No lower  extremity edema bilaterally. Respiratory: Clear to auscultation with no wheezes or rales.  Poor inspiratory effort. Abdomen: Soft nontender nondistended with normal bowel sounds x4 quadrants. Muskuloskeletal: No cyanosis, clubbing or edema noted bilaterally Neuro: CN II-XII intact, strength, sensation, reflexes Skin: No ulcerative lesions noted or rashes Psychiatry: Unable to assess judgment or mood due to lethargy.          Labs on Admission:  Basic Metabolic Panel: Recent Labs  Lab 03/10/23 1134 03/12/23 1904 03/12/23 2123  NA 139 142  --   K 4.1 3.0*  --   CL 101 104  --   CO2 30 25  --   GLUCOSE 102* 103*  --   BUN 13 18  --   CREATININE 0.75 0.78  --   CALCIUM 9.1 8.7*  --   MG  --   --  2.3   Liver Function Tests: Recent Labs  Lab 03/12/23 1904  AST 31  ALT 22  ALKPHOS 77  BILITOT 1.0  PROT 6.6  ALBUMIN 3.7   Recent Labs  Lab 03/12/23 1904  LIPASE 30   No results for input(s): "AMMONIA" in the last 168 hours. CBC: Recent Labs  Lab 03/10/23 1134 03/12/23 1904  WBC 8.2 9.4  NEUTROABS 5.2  --   HGB 13.4 13.9  HCT 40.4 41.9  MCV 94.0 92.3  PLT 198 216   Cardiac Enzymes: No results for input(s): "CKTOTAL", "CKMB", "CKMBINDEX", "TROPONINI" in  the last 168 hours.  BNP (last 3 results) Recent Labs    02/08/23 0213 02/09/23 0419 02/27/23 0654  BNP 89.9 20.9 25.4    ProBNP (last 3 results) No results for input(s): "PROBNP" in the last 8760 hours.  CBG: No results for input(s): "GLUCAP" in the last 168 hours.  Radiological Exams on Admission: CT ABDOMEN PELVIS W CONTRAST  Result Date: 03/12/2023 CLINICAL DATA:  Acute abdominal pain EXAM: CT ABDOMEN AND PELVIS WITH CONTRAST TECHNIQUE: Multidetector CT imaging of the abdomen and pelvis was performed using the standard protocol following bolus administration of intravenous contrast. RADIATION DOSE REDUCTION: This exam was performed according to the departmental dose-optimization program which includes automated exposure control, adjustment of the mA and/or kV according to patient size and/or use of iterative reconstruction technique. CONTRAST:  75mL OMNIPAQUE IOHEXOL 350 MG/ML SOLN COMPARISON:  03/10/2023 and 02/13/2023 FINDINGS: Lower chest: Mild dependent atelectasis in the bilateral lower lobes. Hepatobiliary: Liver is within normal limits. Vicarious excretion of contrast in the gallbladder. No intrahepatic or extrahepatic duct dilatation. Pancreas: Within normal limits. Spleen: Within normal limits. Adrenals/Urinary Tract: Adrenal glands are within normal limits. Kidneys are within normal limits.  No hydronephrosis. Bladder is within normal limits. Stomach/Bowel: Stomach is notable for a tiny hiatal hernia. No evidence of bowel obstruction. Normal appendix (series 4/image 59) Left colonic diverticulosis, without evidence of diverticulitis. Moderate colonic stool burden, suggesting mild constipation. Vascular/Lymphatic: No evidence of abdominal aortic aneurysm. Atherosclerotic calcifications of the abdominal aorta and branch vessels. No suspicious abdominopelvic lymphadenopathy. Reproductive: Uterus is within normal limits. 6.7 x 5.7 cm left ovarian/adnexal cyst, chronic. No right adnexal  mass. Other: No abdominopelvic ascites. 3.2 x 2.6 cm hematoma beneath the right rectus sheath (series 4/image 61), grossly unchanged. Additional 15 mm fluid collection in the right obturator fossa (series 4/image 89), unchanged. Additional 12 mm possible fluid collection beneath the left lower pelvic wall anterior to the bladder (series 4/image 74). These are all unchanged from recent CT but improved from 02/13/2023. Musculoskeletal: Degenerative changes of the visualized thoracolumbar  spine. IMPRESSION: No interval change from recent CT. Multiple pelvic fluid collections, as described above, reflecting involving hematomas. 6.7 cm left ovarian/adnexal cyst, chronic. Consider annual follow-up as clinically warranted. Electronically Signed   By: Charline Bills M.D.   On: 03/12/2023 23:58    EKG: I independently viewed the EKG done and my findings are as followed: None available at the time of this visit.  Assessment/Plan Present on Admission:  Intractable nausea and vomiting  Principal Problem:   Intractable nausea and vomiting  Intractable nausea and vomiting, unclear etiology Possibly secondary to constipation Bowel regimen in place As needed IV antiemetics IV fluid hydration LR KCl 40 mill equivalent at 50 cc/h x 1 day Replete electrolytes as indicated  Acute metabolic encephalopathy suspect multifactorial, dehydration versus others History of seizures History of hallucinations UA with mild paranoia, will treat, since she is immunocompromised Reorient as needed Delirium precautions Fall and aspiration precautions N.p.o. due to lethargy to avoid aspiration.  Hypokalemia secondary to poor oral intake Serum potassium 3.0 Magnesium 2.3 Potassium repleted with IV KCl 10 mill equivalent x 3 doses LR KCl 40 mill equivalent at 50 cc/h x 1 day. Repeat BMP in the morning Encourage oral intake if she can tolerate a diet  Hypertension, BPs are currently labile, ranging from soft to  hypotensive Hold off home oral antihypertensives Give 1 L IV fluid bolus NS x 1, received 1 L IV fluid bolus NS earlier in the ED Maintain MAP above 65 Will obtain lactic acid level  Physical debility PT OT assessment Fall precautions  Mild pyuria Presumptive UTI in immunocompromised state, POA Trace leukocytes and mild WBC UA 11-20 We will treat since she is immunocompromised Follow lactic acid and urine culture DC antibiotics if urine culture is negative  Seizure disorder Resume home AEDs IV Vimpat 200 mg twice daily Seizure precautions IV Ativan as needed for breakthrough seizures  Concern for possible dysphagia Speech therapist consulted N.p.o. until more alert and passes a swallow evaluation Aspiration precautions in place  CLL in remission History of ITP on Promacta History of intracranial hemorrhage History of seizure disorder History of intra-abdominal hematoma Currently DNR Palliative care medicine consulted to assist with establishing goals of care   Time: 75 minutes.    DVT prophylaxis: SCDs, high risk for bleeding.  Code Status: DNR  Family Communication: Updated the patient's daughter and husband at bedside.  Disposition Plan: Admitted to telemetry surgical unit  Consults called: Palliative care team  Admission status: Inpatient status.   Status is: Inpatient The patient requires at least 2 midnights for further evaluation and treatment of present condition.   Darlin Drop MD [facility] Pager [phone]  If 7PM-7AM, please contact night-coverage [email or web address] Password [email or web address]  03/13/2023, 4:03 AM

## 2023-03-13 NOTE — Progress Notes (Signed)
Patient c/o nausea after she ate the jello, she refused AM meds at this time. Prn antinausea med is too early to admin, MD paged for other alternative. Son at bedside agreed with POC. WIll continue to monitor.

## 2023-03-13 NOTE — Evaluation (Signed)
Occupational Therapy Evaluation Patient Details Name: Selena Branch MRN: [id number] DOB: Jan 10, 1941 Today's Date: 03/13/2023   History of Present Illness Pt is an 82 y/o F presenting to ED On 6/29 with abdominal pain and constipation, admitted for intractible n/v. 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   Clinical Impression   Pt reports having assist at baseline from family who is present close to 24/7, also lives with spouse who can assist. Pt currently needing min-mod A for ADLs, min guard for bed mobility and min A +2 for transfers with RW. Pt needing min cues for command follow/some repetition during session, pt with BUE tremoring movements and using R >L UE for BADL during session. Pt presenting with impairments listed below, will follow acutely. Recommend HHOT at d/c.       Recommendations for follow up therapy are one component of a multi-disciplinary discharge planning process, led by the attending physician.  Recommendations may be updated based on patient status, additional functional criteria and insurance authorization.   Assistance Recommended at Discharge Frequent or constant Supervision/Assistance  Patient can return home with the following A little help with walking and/or transfers;A lot of help with bathing/dressing/bathroom;Assistance with cooking/housework;Direct supervision/assist for medications management;Direct supervision/assist for financial management;Help with stairs or ramp for entrance;Assist for transportation    Functional Status Assessment  Patient has had a recent decline in their functional status and demonstrates the ability to make significant improvements in function in a reasonable and predictable amount of time.  Equipment Recommendations  BSC/3in1    Recommendations for Other Services PT  consult     Precautions / Restrictions Precautions Precautions: Fall Precaution Comments: seizure Restrictions Weight Bearing Restrictions: No      Mobility Bed Mobility Overal bed mobility: Needs Assistance Bed Mobility: Supine to Sit     Supine to sit: Min guard          Transfers Overall transfer level: Needs assistance Equipment used: Rolling walker (2 wheels) Transfers: Sit to/from Stand Sit to Stand: Min assist, +2 safety/equipment                  Balance Overall balance assessment: Needs assistance Sitting-balance support: Feet supported Sitting balance-Leahy Scale: Fair     Standing balance support: Bilateral upper extremity supported, Reliant on assistive device for balance Standing balance-Leahy Scale: Poor                             ADL either performed or assessed with clinical judgement   ADL Overall ADL's : Needs assistance/impaired Eating/Feeding: Minimal assistance;Sitting   Grooming: Minimal assistance;Sitting;Standing   Upper Body Bathing: Moderate assistance;Standing;Sitting   Lower Body Bathing: Moderate assistance;Sit to/from stand;Sitting/lateral leans   Upper Body Dressing : Moderate assistance;Sitting   Lower Body Dressing: Moderate assistance;Sit to/from stand;Sitting/lateral leans   Toilet Transfer: Minimal assistance;Transfer board;Regular Toilet;Rolling walker (2 wheels)   Toileting- Clothing Manipulation and Hygiene: Minimal assistance       Functional mobility during ADLs: Minimal assistance;Rolling walker (2 wheels)       Vision   Vision Assessment?: No apparent visual deficits     Perception Perception Perception Tested?: No   Praxis Praxis Praxis tested?: Not tested    Pertinent Vitals/Pain Pain Assessment Pain Assessment: No/denies pain     Hand Dominance Right   Extremity/Trunk Assessment Upper  Extremity Assessment Upper Extremity Assessment: Generalized weakness;RUE  deficits/detail;LUE deficits/detail RUE Deficits / Details: tremoring noted LUE Deficits / Details: does not use LUE to assist RUE for fine motor/bilateral aspects of task, functional for grip on RW, tremoring noted   Lower Extremity Assessment Lower Extremity Assessment: Defer to PT evaluation   Cervical / Trunk Assessment Cervical / Trunk Assessment: Kyphotic   Communication Communication Communication: Expressive difficulties (hx of aphasia)   Cognition Arousal/Alertness: Awake/alert Behavior During Therapy: WFL for tasks assessed/performed Overall Cognitive Status: Impaired/Different from baseline                       Memory: Decreased short-term memory Following Commands: Follows one step commands with increased time       General Comments: increased time and increased repetition for commands     General Comments  VSS on RA    Exercises     Shoulder Instructions      Home Living Family/patient expects to be discharged to:: Private residence Living Arrangements: Spouse/significant other Available Help at Discharge: Family;Available 24 hours/day (lives with spouse but family has increased to 24/7 assist) Type of Home: House Home Access: Stairs to enter [employer] of Steps: 2   Home Layout: One level     Bathroom Shower/Tub: Tub/shower unit;Walk-in shower   Bathroom Toilet: Standard Bathroom Accessibility: Yes How Accessible: Accessible via walker Home Equipment: Agricultural consultant (2 wheels)      Lives With: Significant other    Prior Functioning/Environment Prior Level of Function : Needs assist             Mobility Comments: was using RW and furniture walking ADLs Comments: assist for LB ADLs, household ambulation, family assists with med mgmt        OT Problem List: Impaired balance (sitting and/or standing);Decreased cognition;Decreased safety awareness;Impaired UE functional use;Impaired tone      OT  Treatment/Interventions: Self-care/ADL training;DME and/or AE instruction;Patient/family education;Balance training;Cognitive remediation/compensation    OT Goals(Current goals can be found in the care plan section) Acute Rehab OT Goals Patient Stated Goal: none stated OT Goal Formulation: With patient Time For Goal Achievement: 03/27/23 Potential to Achieve Goals: Good ADL Goals Pt Will Perform Upper Body Dressing: with supervision;sitting Pt Will Perform Lower Body Dressing: with min assist;sitting/lateral leans;sit to/from stand Pt Will Transfer to Toilet: with modified independence;ambulating;regular height toilet Pt Will Perform Tub/Shower Transfer: Tub transfer;Shower transfer;with modified independence;ambulating Additional ADL Goal #1: pt perform BUE functional task with min guard A in prep for ADLs  OT Frequency: Min 2X/week    Co-evaluation PT/OT/SLP Co-Evaluation/Treatment: Yes Reason for Co-Treatment: For patient/therapist safety   OT goals addressed during session: ADL's and self-care;Strengthening/ROM      AM-PAC OT "6 Clicks" Daily Activity     Outcome Measure Help from another person eating meals?: A Little Help from another person taking care of personal grooming?: A Little Help from another person toileting, which includes using toliet, bedpan, or urinal?: A Lot Help from another person bathing (including washing, rinsing, drying)?: A Lot Help from another person to put on and taking off regular upper body clothing?: A Little Help from another person to put on and taking off regular lower body clothing?: A Lot 6 Click Score: 15   End of Session Equipment Utilized During Treatment: Gait belt;Rolling walker (2 wheels) Nurse Communication: Mobility status  Activity Tolerance: Patient tolerated treatment well Patient left: in chair;with call bell/phone within reach;with chair alarm set;with family/visitor present  OT Visit  Diagnosis: Other abnormalities of gait and  mobility (R26.89);Other symptoms and signs involving cognitive function;Cognitive communication deficit (R41.841);Muscle weakness (generalized) (M62.81)                Time: 1610-9604 OT Time Calculation (min): 32 min Charges:  OT General Charges $OT Visit: 1 Visit OT Evaluation $OT Eval Moderate Complexity: 1 Mod  Elaijah Munoz K, OTD, OTR/L SecureChat Preferred Acute Rehab [phone]   Carver Fila Koonce 03/13/2023, 4:36 PM

## 2023-03-13 NOTE — Progress Notes (Signed)
Patient seen and examined personally, I reviewed the chart, history and physical and admission note, done by admitting physician this morning and agree with the same with following addendum.  Please refer to the morning admission note for more detailed plan of care.  Briefly,  82 year old female from home history of CLL intracranial hemorrhage ITP admitted late May for seizure and aphasia 5/25 - 6/17 and discharged to SNF, returned to home few days ago and complaining of persistent nausea, vomiting x 3 days PTA, recently seen in the ED for same on 6/27 with constipation and had CT scan discharged home. Patient has not been able to keep anything orally down Since her diagnosis.  She has not visualized sensation and takes Seroquel In the ED BP stable not hypoxic Labs reviewed WBC 9.4  hemoglobin 13.9, platelet count 216.  Serum potassium 3.0, glucose 103.  Magnesium 2.3.  Renal function is stable lactic acid normal CT abdomen pelvis with contrast>> no change from recent CT multiple pelvic fluid collection reflecting evolving hematomas 6.7 cm left ovarian/adnexal cyst chronic advise annual follow-up as clinically warranted. Patient is admitted for intermittent nausea vomiting and constipation along with metabolic encephalopathy   ON exam Multiple family at bedside Patient lethargic but able to wake up and interact denies any abdominal pain endorses is passing gas.  Reports she has been constantly nauseous Bowel sounds present on admission mildly tender, soft Patient prefers to lay on the right side due to ongoing nausea  A/P Intractable nausea vomiting Possible dysphagia: Onset Thursday night, when she had her last BM. Etiology suspect form constipation. Daughter says onset after taking seizure meds that she started taking on Monday after discharge from SNF on saturday. But  there is no change on dose- it has been vimpat 20 mg bid, She has been refusing to eat, taking pills with dr  peppers. Continue aggressive bowel regimen, antiemetics IV fluid hydration. Speech eval obtain okay for clear or full liquid diet today  Constipation : CT moderate colonic stool burden on 6/29, and on 6/27 Large colonic stool burden, with stool distending the rectum. Had good BM on Thursday, got enema in ED x2 at that time.  Added Dulcolax, if needed to enema as well.  Acute metabolic encephalopathy Recent seizure related admission from 5/25 - 6/17, missed SED  on Saturday/sunday. Hallucinations Possible UTI: Encephalopathy suspect multifactorial in the setting of dehydration/coistipation, recent hospitalization ongoing since last discharge- having delirium on and off sicne dc form snf as well.  versus other-On last admission she had MRI brain, LTM EEG no seizure no acute finding-she was managed treated for delirium.  She was discharged on Seroquel 50 in the bedtime and 25 PRN and also was on Ativan 1 mg as needed for seizure.  UA with pyuria 11-20 trace leukocytes ketones 20 empirically started on ceftriaxone for UTI.  Continue IVF.,  Keep on fall precautions seizure precaution encourage PT OT, keep on delirium precaution follow-up urine culture.  Hypokalemia improving  Hypertension: BP labile-keep in ivf.  Deconditioning/debility continue PT OT  History of ITP on Promacta History of ICH History of intra-abdominal hematoma/rectus muscle hematoma/possible hemoperitoneum: She had hematoma noted on her CT from 6/2 and pelvic, right lower rectus muscle, monitor hemoglobin  Deconditioning debility/high risk for readmission advanced age: Currently DNR: Patient remains high risk for readmissions, she will benefit with palliative care evaluations-and has been consulted.  Family worried about patient's condition but currently she is hemodynamically stable alert, although prognosis does not appear bright.

## 2023-03-13 NOTE — Consult Note (Signed)
Consultation Note Date: 03/13/2023 at 1400  Patient Name: Selena Branch  DOB: [birth year]  MRN: [id number]  Age / Sex: 82 y.o., female  PCP: Gweneth Dimitri, MD Referring Physician: Lanae Boast, MD  Reason for Consultation: Establishing goals of care  HPI/Patient Profile: 82 y.o. female  with past medical history of CLL, ITP, and recent hospitalization (5/25 - 6/17) for seizures and aphasia.  Patient was discharged to [facility] farm SNF.  She was discharged from SNF to home about a week ago.  Family endorses patient felt well after rehab but soon after experienced nausea, vomiting, and constipation at home.  Patient was admitted on 03/12/2023 with intractable nausea with vomiting and constipation.  Patient is being treated with antiemetics, IV fluids, and aggressive bowel regimen.  Patient also receiving ceftriaxone empirically for suspected UTI.  PMT was consulted to discuss goals of care.   Clinical Assessment and Goals of Care: I have reviewed medical records including EPIC notes, labs and imaging, assessed the patient and then met with patient and her granddaughter Cricket at bedside to discuss diagnosis prognosis, GOC, EOL wishes, disposition and options.  Cricket provided all history as patient is not able to participate in medical decision making and goals of care discussions independently at this time.    Patient is awake and alert and able to acknowledge my presence.  She confirms she is not in any pain and is requesting ice cream.  She does not participate in further discussions during my visit.  I introduced Palliative Medicine as specialized medical care for people living with serious illness. It focuses on providing relief from the symptoms and stress of a serious illness. The goal is to improve quality of life for both the patient and the family.  We discussed a brief life review of the patient.  Patient  has 1 daughter, Arline Asp, who is her HCPOA.  Patient is married and lives at home with her husband Orvilla Fus, who suffers from dementia.  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  Granddaughter endorses patient is "with it and very independent".  As far as functional and nutritional status granddaughter endorses patient was independent and able to complete ADLs without difficulty prior to recent seizure and admission to SNF.  We discussed patient's current illness and what it means in the larger context of patient's on-going co-morbidities.    I attempted to elicit values and goals of care important to the patient.  Patient unable to participate in goals of care discussions and granddaughter defers all discussions to her mother/HCPOA Arline Asp.  After assessing the patient, I spoke with Arline Asp over the phone.  I reiterated the role of palliative medicine in her mother's care team.  Arline Asp shares concerns/complaints of SNF rehab.  I conveyed she is able to file a complaint against the facility but that as a [facility] facility we are not able to aid in outside facility issues.  Arline Asp also inquired about getting additional help in home once patient is discharged from this hospitalization.  I shared  I will convey her request to [facility] who is following closely for discharge planning.  I attempted to elicit values and goals important to patient and Arline Asp.  She shares she does not want to do anything to her mother that would cause harm.  She would like to know what is causing her mother's change in mentation.  We reviewed multifactorial causes such as dehydration/constipation, recent hospitalization, hypokalemia, and possible UTI.  Arline Asp was appreciative of this medical update.  Advance directives, concepts specific to code status, artificial feeding and hydration, and rehospitalization were considered and discussed.Arline Asp shares she is remaining in agreement with DNR and limited interventions.  She shares in  the past that patient had said she would be accepting of a feeding tube.  However, Arline Asp shares she does not believe that would be in her mother's best interest if needed given patient has removed multiple lines/NG tube in previous admission.  I highlighted that there is nothing pressing about needing to make these decisions now but that discussing them in advance of urgent or emergent situations is helpful for patients and families.  In review of patient's chart, I would recommend a stool softener twice daily in addition to current bowel regimen of miralax (though patient refused it this AM) and PRN suppository. Orders adjusted.      Discussed with Arline Asp the importance of continued conversation with family and the medical providers regarding overall plan of care and treatment options, ensuring decisions are within the context of the patient's values and GOCs.  Reviewed that PT/OT and TOC continue to follow for discharge planning.   Questions and concerns were addressed.  Arline Asp was encouraged to call with questions or concerns.   PMT will continue to follow.  Primary Decision Maker HCPOA  Physical Exam Vitals reviewed.  Constitutional:      General: She is not in acute distress. HENT:     Head: Normocephalic.  Cardiovascular:     Rate and Rhythm: Normal rate.  Pulmonary:     Effort: Pulmonary effort is normal.  Abdominal:     General: Bowel sounds are normal.     Palpations: Abdomen is soft.  Skin:    General: Skin is warm and dry.  Neurological:     Mental Status: She is alert.     Comments: Oriented to self  Psychiatric:        Mood and Affect: Mood is not anxious or depressed.        Behavior: Behavior normal.     Palliative Assessment/Data: 60%     Thank you for this consult. Palliative medicine will continue to follow and assist holistically.   Time Total: 75 minutes Greater than 50%  of this time was spent counseling and coordinating care related to the above  assessment and plan.  Signed by: Georgiann Cocker, DNP, FNP-BC Palliative Medicine    Please contact Palliative Medicine Team phone at [phone] for questions and concerns.  For individual provider: See Loretha Stapler

## 2023-03-14 ENCOUNTER — Inpatient Hospital Stay: Payer: PPO

## 2023-03-14 ENCOUNTER — Ambulatory Visit (HOSPITAL_COMMUNITY): Payer: PPO

## 2023-03-14 DIAGNOSIS — R112 Nausea with vomiting, unspecified: Secondary | ICD-10-CM | POA: Diagnosis not present

## 2023-03-14 LAB — COMPREHENSIVE METABOLIC PANEL
ALT: 19 U/L (ref 0–44)
AST: 27 U/L (ref 15–41)
Albumin: 3.3 g/dL — ABNORMAL LOW (ref 3.5–5.0)
Alkaline Phosphatase: 69 U/L (ref 38–126)
Anion gap: 11 (ref 5–15)
BUN: 6 mg/dL — ABNORMAL LOW (ref 8–23)
CO2: 22 mmol/L (ref 22–32)
Calcium: 8.1 mg/dL — ABNORMAL LOW (ref 8.9–10.3)
Chloride: 106 mmol/L (ref 98–111)
Creatinine, Ser: 0.65 mg/dL (ref 0.44–1.00)
GFR, Estimated: >60 mL/min (ref >60)
Glucose, Bld: 101 mg/dL — ABNORMAL HIGH (ref 70–99)
Potassium: 3.8 mmol/L (ref 3.5–5.1)
Sodium: 139 mmol/L (ref 135–145)
Total Bilirubin: 1 mg/dL (ref 0.3–1.2)
Total Protein: 5.9 g/dL — ABNORMAL LOW (ref 6.5–8.1)

## 2023-03-14 LAB — CBC
HCT: 40.1 % (ref 36.0–46.0)
Hemoglobin: 13 g/dL (ref 12.0–15.0)
MCH: 30 pg (ref 26.0–34.0)
MCHC: 32.4 g/dL (ref 30.0–36.0)
MCV: 92.4 fL (ref 80.0–100.0)
Platelets: 178 10*3/uL (ref 150–400)
RBC: 4.34 MIL/uL (ref 3.87–5.11)
RDW: 15.5 % (ref 11.5–15.5)
WBC: 9.7 10*3/uL (ref 4.0–10.5)
nRBC: 0 % (ref 0.0–0.2)

## 2023-03-14 LAB — URINE CULTURE: Culture: >=100000 — AB

## 2023-03-14 LAB — GLUCOSE, CAPILLARY
Glucose-Capillary: 90 mg/dL (ref 70–99)
Glucose-Capillary: 88 mg/dL (ref 70–99)
Glucose-Capillary: 94 mg/dL (ref 70–99)
Glucose-Capillary: 100 mg/dL — ABNORMAL HIGH (ref 70–99)
Glucose-Capillary: 114 mg/dL — ABNORMAL HIGH (ref 70–99)

## 2023-03-14 MED ORDER — BRINZOLAMIDE 1 % OP SUSP
1.0000 [drp] | Freq: Three times a day (TID) | OPHTHALMIC | Status: DC
  Administered 2023-03-14 – 2023-03-16 (×6): 1 [drp] via OPHTHALMIC
  Filled 2023-03-14: qty 10

## 2023-03-14 MED ORDER — OYSTER SHELL CALCIUM/D3 500-5 MG-MCG PO TABS
1.0000 | ORAL_TABLET | Freq: Every day | ORAL | Status: DC
  Administered 2023-03-14 – 2023-03-16 (×3): 1 via ORAL
  Filled 2023-03-14 (×3): qty 1

## 2023-03-14 MED ORDER — SODIUM CHLORIDE 0.9 % IV SOLN
1.0000 g | Freq: Three times a day (TID) | INTRAVENOUS | Status: DC
  Administered 2023-03-14 – 2023-03-16 (×6): 1 g via INTRAVENOUS
  Filled 2023-03-14 (×8): qty 1000

## 2023-03-14 MED ORDER — LATANOPROST 0.005 % OP SOLN
1.0000 [drp] | Freq: Every day | OPHTHALMIC | Status: DC
  Administered 2023-03-14 – 2023-03-15 (×2): 1 [drp] via OPHTHALMIC
  Filled 2023-03-14: qty 2.5

## 2023-03-14 MED ORDER — AMLODIPINE BESYLATE 2.5 MG PO TABS
2.5000 mg | ORAL_TABLET | Freq: Every day | ORAL | Status: DC
  Administered 2023-03-14: 2.5 mg via ORAL
  Filled 2023-03-14: qty 1

## 2023-03-14 MED ORDER — LACTATED RINGERS IV SOLN: INTRAVENOUS | Status: DC

## 2023-03-14 MED ORDER — BRIMONIDINE TARTRATE 0.2 % OP SOLN
1.0000 [drp] | Freq: Three times a day (TID) | OPHTHALMIC | Status: DC
  Administered 2023-03-14 – 2023-03-16 (×6): 1 [drp] via OPHTHALMIC
  Filled 2023-03-14: qty 5

## 2023-03-14 MED ORDER — CARVEDILOL 3.125 MG PO TABS
3.1250 mg | ORAL_TABLET | Freq: Two times a day (BID) | ORAL | Status: DC
  Administered 2023-03-14 – 2023-03-16 (×4): 3.125 mg via ORAL
  Filled 2023-03-14 (×4): qty 1

## 2023-03-14 NOTE — Progress Notes (Signed)
Palliative Care Progress Note, Assessment & Plan   Patient Name: Selena Branch       Date: 03/14/2023 DOB: 20-Jul-1941  Age: 82 y.o. MRN#: [id number] Attending Physician: Lanae Boast, MD Primary Care Physician: Gweneth Dimitri, MD Admit Date: 03/12/2023  Subjective: Patient is lying in bed in no apparent distress.  She acknowledges my presence and is able to make her wishes known.  She has no acute complaints at this time.  No family or friends present during my visit.  RN at bedside administering medications.  HPI: 82 y.o. female  with past medical history of CLL, ITP, and recent hospitalization (5/25 - 6/17) for seizures and aphasia.  Patient was discharged to [facility] SNF.  She was discharged from SNF to home about a week ago.  Family endorses patient felt well after rehab but soon after experienced nausea, vomiting, and constipation at home.  Patient was admitted on 03/12/2023 with intractable nausea with vomiting and constipation.   Patient is being treated with antiemetics, IV fluids, and aggressive bowel regimen.  Patient also receiving ceftriaxone empirically for suspected UTI.   PMT was consulted to discuss goals of care.   Summary of counseling/coordination of care: After reviewing the patient's chart and assessing the patient at bedside, I spoke with patient in regards to symptom management and goals of care.  I attempted to gauge patient's understanding of her current medical situation.  She says she had a stroke.  I conveyed that patient is hospitalized for nausea with vomiting and constipation during this hospitalization.  I reviewed we are treating her with IV fluids and antibiotics for what is suspected as a urinary tract infection.  Questions and concerns addressed.  While patient has no  physical complaints, she is hyperfocused that an incident occurred outside of her window yesterday evening that was disturbing to her.  She describes it as a body being thrown into a dumpster and she wants someone to check on it.  She shares she knows that "everyone thinks I am delusional" but that she knows this actually happened.    Despite my efforts to redirect the patient, she was adamant that we need to check on the body in the dumpster.  Nurse at bedside heard entire story as well and also engaged in soothing and calming measures. Patient eventually moved on from [facility] story and was compliant with medication administration.  Other than this story, but is alert, oriented, and engaged in discussion of her care appropriately.   Symptoms assessed.  Patient has no acute complaints at this time.  She denies nausea/vomiting, constipation, diarrhea, headache, chest pain, or other acute issues.  No adjustments to medications or MAR needed.  Goals were made clear during my discussions with patient's daughter/HCPOA Cindy yesterday. DNR with limited interventions remains. Treat the treatable and time for outcomes needed to see if patient's mentation improves.   PMT will continue to follow.   Physical Exam Vitals reviewed.  Constitutional:      General: She is not in acute distress.    Appearance: She is normal weight. She is not ill-appearing.  HENT:     Head: Normocephalic.  Cardiovascular:     Rate and Rhythm:  Normal rate.     Heart sounds: Normal heart sounds.  Abdominal:     General: Bowel sounds are normal.  Skin:    General: Skin is warm and dry.  Neurological:     Mental Status: She is alert.  Psychiatric:        Mood and Affect: Mood is not anxious.        Behavior: Behavior normal.             Total Time 25 minutes   Luberta Grabinski L. Manon Hilding, FNP-BC Palliative Medicine Team Team Phone # [phone]

## 2023-03-14 NOTE — Progress Notes (Signed)
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 Patient has not been able to keep anything orally down Since her diagnosis.  She has not visualized sensation and takes Seroquel In the ED BP stable not hypoxic Labs reviewed WBC 9.4  hemoglobin 13.9, platelet count 216.  Serum potassium 3.0, glucose 103.  Magnesium 2.3.  Renal function is stable lactic acid normal CT abdomen pelvis with contrast>> no change from recent CT multiple pelvic fluid collection reflecting evolving hematomas 6.7 cm left ovarian/adnexal cyst chronic advise annual follow-up as clinically warranted. Patient is admitted for intermittent nausea vomiting and constipation along with metabolic encephalopathy    Subjective: Patient seen and examined this morning Overnight some delirium She is alert awake oriented to self current place follows commands appropriately She denies nausea, complains of abdominal tenderness on exam Appears more alert awake and appropriate than yesterday   Assessment and Plan: Principal Problem:   Intractable nausea and vomiting   Intractable nausea vomiting Constipation: Etiology suspect form constipation. Daughter says onset of nausea after taking seizure meds that she started taking on Monday after discharge from SNF on saturday. But  there is no change on dose when I reviewed on vimpat. CT moderate colonic stool burden on 6/29, and on 6/27 Large colonic stool burden, with stool distending the rectum. Had good BM on Thursday, got enema in ED x2 at that time. She has been refusing to eat,  taking pills with dr peppers at home. Speech has cleared for diet.  No longer nausea today.   Continue laxatives stool softener and Dulcolax PR as needed had a BM yesterday.  Continue supportive care, gentle hydration.  Acute metabolic encephalopathy Recent seizure related admission from 5/25 - 6/17, missed SED  on Saturday/sunday. Hallucinations Possible UTI: Encephalopathy suspect multifactorial in the setting of dehydration/coistipation, recent hospitalization and seizure. Some degree of delirium ongoing since last discharge.  Overnight also confusion noted, This morning alert awake fairly oriented communicative.  Continue on gentle IV hydration supportive care, Continue UTI treatment, home Vimpat On last admit MRI brain, LTM EEG no seizure no acute finding-she was managed treated for delirium.She was discharged on Seroquel 50 in the bedtime and 25 PRN and also was on Ativan 1 mg as needed for seizure.  UTI:UA with pyuria 11-20 trace leukocytes ketones 20 empirically started on ceftriaxone for UTI, FU CULTURE   Hypokalemia improvED   Hypertension: BP labile-keep On ivf.  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  Family worried about patient's condition but currently she is hemodynamically stable alert Overall prognosis does not appear bright, palliative care continues to follow along.  DVT prophylaxis: SCDs Start: 03/13/23 0408 Code Status:   Code Status: DNR Family Communication: plan of care discussed with patient no family at bedside. Patient status is:  Inpatient because of  encephalopathy, UTI Level of care: Progressive   Dispo: The patient is from: home            Anticipated disposition: home, daughter does want to consider SNF at this time Objective: Vitals last 24 hrs: Vitals:   03/13/23 1951 03/13/23 2329 03/14/23 0642 03/14/23 0800  BP: (!) 166/78 (!) 148/62 (!) 165/80 (!) 149/65  Pulse: 84 83 76   Resp: (!) 29 13 15 17   Temp: 98.1 F (36.7 C) 98.5 F (36.9 C) 98.6 F (37 C) 98.6 F (37 C)  TempSrc: Oral Oral Oral Oral  SpO2: 100% 92% 98%   Weight:      Height:       Weight change:   Physical Examination:  General exam: alert awake, appears frail, elderly  HEENT:Oral mucosa moist, Ear/Nose WNL grossly Respiratory system: bilaterally clear BS, no use of accessory muscle Cardiovascular system: S1 & S2 +, No JVD. Gastrointestinal system: Abdomen soft, mildly tender on lower abdomen,ND, BS+ Nervous System:Alert, awake, oriented to self current place, moving extremities. Extremities: LE edema neg,distal peripheral pulses palpable.  Skin: No rashes,no icterus. MSK: THIN muscle bulk,tone, power  Medications reviewed:  Scheduled Meds:  atorvastatin  40 mg Oral Daily   eltrombopag  25 mg Oral UD   feeding supplement  1 Container Oral TID BM   multivitamin with minerals  1 tablet Oral Daily   pantoprazole  40 mg Oral BID   polyethylene glycol  17 g Oral Daily   senna-docusate  1 tablet Oral BID   Continuous Infusions:  cefTRIAXone (ROCEPHIN)  IV 2 g (03/14/23 0447)   lacosamide (VIMPAT) IV 200 mg (03/14/23 1018)   lactated ringers 1,000 mL with potassium chloride 40 mEq infusion 75 mL/hr at 03/14/23 0320    Diet Order             Diet full liquid Room service appropriate? Yes; Fluid consistency: Thin  Diet effective now                  Intake/Output Summary (Last 24 hours) at 03/14/2023 1113 Last data filed at 03/14/2023 0643 Gross per 24 hour  Intake 1684.68 ml  Output 100 ml  Net 1584.68 ml   Net IO Since Admission: 684.68  mL [03/14/23 1113]  Wt Readings from Last 3 Encounters:  03/13/23 44.9 kg  03/10/23 46.3 kg  02/28/23 51.2 kg     Unresulted Labs (From admission, onward)     Start     Ordered   03/14/23 0500  Comprehensive metabolic panel  Daily,   R     Question:  Specimen collection method  Answer:  Lab=Lab collect   03/13/23 1119   03/14/23 0500  CBC  Daily,   R     Question:  Specimen collection method  Answer:  Lab=Lab collect   03/13/23 1119          Data Reviewed: I have personally reviewed following labs and imaging studies CBC: Recent Labs  Lab 03/10/23 1134 03/12/23 1904 03/13/23 0724 03/14/23 0311  WBC 8.2 9.4 6.5 9.7  NEUTROABS 5.2  --   --   --   HGB 13.4 13.9 12.8 13.0  HCT 40.4 41.9 39.3 40.1  MCV 94.0 92.3 95.4 92.4  PLT 198 216 179 178   Basic Metabolic Panel: Recent Labs  Lab 03/10/23 1134 03/12/23 1904 03/12/23 2123 03/13/23 0724 03/14/23 0311  NA 139 142  --  142 139  K 4.1 3.0*  --  3.4* 3.8  CL 101 104  --  110 106  CO2 30 25  --  21* 22  GLUCOSE 102* 103*  --  82 101*  BUN 13 18  --  12 6*  CREATININE 0.75 0.78  --  0.69 0.65  CALCIUM 9.1 8.7*  --  7.4* 8.1*  MG  --   --  2.3  --   --    GFR: Estimated Creatinine Clearance: 34.4 mL/min (by C-G formula based on SCr of 0.65 mg/dL). Liver Function Tests: Recent Labs  Lab 03/12/23 1904 03/14/23 0311  AST 31 27  ALT 22 19  ALKPHOS 77 69  BILITOT 1.0 1.0  PROT 6.6 5.9*  ALBUMIN 3.7 3.3*   Recent Labs  Lab 03/12/23 1904  LIPASE 30   CBG: Recent Labs  Lab 03/13/23 1328 03/13/23 1634 03/13/23 2108 03/14/23 0648 03/14/23 0804  GLUCAP 80 138* 93 90 88   Recent Labs  Lab 03/13/23 0447  LATICACIDVEN 0.8    Recent Results (from the past 240 hour(s))  Urine Culture     Status: None (Preliminary result)   Collection Time: 03/13/23  7:30 AM   Specimen: Urine, Random  Result Value Ref Range Status   Specimen Description URINE, RANDOM  Final   Special Requests URINE, CLEAN CATCH   Final   Culture   Final    CULTURE REINCUBATED FOR BETTER GROWTH Performed at [facility], [address], [place], [place] [address]    Report Status PENDING  Incomplete    Antimicrobials: Anti-infectives (From admission, onward)    Start     Dose/Rate Route Frequency Ordered Stop   03/13/23 0445  cefTRIAXone (ROCEPHIN) 2 g in sodium chloride 0.9 % 100 mL IVPB        2 g 200 mL/hr over 30 Minutes Intravenous Every 24 hours 03/13/23 0431        Culture/Microbiology    Component Value Date/Time   SDES URINE, RANDOM 03/13/2023 0730   SPECREQUEST URINE, CLEAN CATCH 03/13/2023 0730   CULT  03/13/2023 0730    CULTURE REINCUBATED FOR BETTER GROWTH Performed at [facility], [address] [place], [place] [address]    REPTSTATUS PENDING 03/13/2023 0730    Radiology Studies: CT ABDOMEN PELVIS W CONTRAST  Result Date: 03/12/2023 CLINICAL DATA:  Acute abdominal pain EXAM: CT ABDOMEN AND PELVIS WITH CONTRAST TECHNIQUE: Multidetector CT imaging of the abdomen and pelvis was performed using the standard protocol following bolus administration of intravenous contrast. RADIATION DOSE REDUCTION: This exam was performed according to the departmental dose-optimization program which includes automated exposure control, adjustment of the mA and/or kV according to patient size and/or use of iterative reconstruction technique. CONTRAST:  75mL OMNIPAQUE IOHEXOL 350 MG/ML SOLN COMPARISON:  03/10/2023 and 02/13/2023 FINDINGS: Lower chest: Mild dependent atelectasis in the bilateral lower lobes. Hepatobiliary: Liver is within normal limits. Vicarious excretion of contrast in the gallbladder. No intrahepatic or extrahepatic duct dilatation. Pancreas: Within normal limits. Spleen: Within normal limits. Adrenals/Urinary Tract: Adrenal glands are within normal limits. Kidneys are within normal limits.  No hydronephrosis. Bladder is within normal limits. Stomach/Bowel: Stomach is notable for a tiny  hiatal hernia. No evidence of bowel obstruction. Normal appendix (series 4/image 59) Left colonic diverticulosis, without evidence of diverticulitis. Moderate colonic stool burden, suggesting mild constipation. Vascular/Lymphatic: No evidence of abdominal aortic aneurysm. Atherosclerotic calcifications of the abdominal aorta and branch vessels. No suspicious abdominopelvic lymphadenopathy. Reproductive: Uterus is within normal limits. 6.7 x 5.7  cm left ovarian/adnexal cyst, chronic. No right adnexal mass. Other: No abdominopelvic ascites. 3.2 x 2.6 cm hematoma beneath the right rectus sheath (series 4/image 61), grossly unchanged. Additional 15 mm fluid collection in the right obturator fossa (series 4/image 89), unchanged. Additional 12 mm possible fluid collection beneath the left lower pelvic wall anterior to the bladder (series 4/image 74). These are all unchanged from recent CT but improved from 02/13/2023. Musculoskeletal: Degenerative changes of the visualized thoracolumbar spine. IMPRESSION: No interval change from recent CT. Multiple pelvic fluid collections, as described above, reflecting involving hematomas. 6.7 cm left ovarian/adnexal cyst, chronic. Consider annual follow-up as clinically warranted. Electronically Signed   By: Charline Bills M.D.   On: 03/12/2023 23:58     LOS: 1 day   Lanae Boast, MD [facility]  03/14/2023, 11:13 AM

## 2023-03-14 NOTE — Progress Notes (Signed)
Physical Therapy Treatment Patient Details Name: Selena Branch MRN: [id number] DOB: 1941-02-19 Today's Date: 03/14/2023   History of Present Illness Pt is an 82 y/o F presenting to ED On 6/29 with abdominal pain and constipation, admitted for intractible n/v. 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    PT Comments  Pt admitted with above diagnosis. Pt was able to ambulate with RW with good safety awareness. No LOB with min challenges to balance. Pt was unaware that she was soiled upon PT arrival. Will follow acutely.  Pt currently with functional limitations due to the deficits listed below (see PT Problem List). Pt will benefit from acute skilled PT to increase their independence and safety with mobility to allow discharge.        Assistance Recommended at Discharge Frequent or constant Supervision/Assistance  If plan is discharge home, recommend the following:  Can travel by private vehicle    A lot of help with walking and/or transfers;A lot of help with bathing/dressing/bathroom;Direct supervision/assist for medications management;Direct supervision/assist for financial management;Assistance with cooking/housework;Assist for transportation;Help with stairs or ramp for entrance   Yes  Equipment Recommendations  Rolling walker (2 wheels) (Youth sized if she doesn't already have one)    Recommendations for Other Services       Precautions / Restrictions Precautions Precautions: Fall Precaution Comments: seizure Restrictions Weight Bearing Restrictions: No     Mobility  Bed Mobility Overal bed mobility: Needs Assistance Bed Mobility: Supine to Sit Rolling: Min guard Sidelying to sit: Min guard       General bed mobility comments: increased time required.    Transfers Overall transfer level: Needs assistance Equipment  used: Rolling walker (2 wheels) Transfers: Sit to/from Stand Sit to Stand: Min guard           General transfer comment: Pt with urine and BM in bed.  Therefore, upon stanidng, cleaned pt fully prior to walk. Cues for hand placement and safety    Ambulation/Gait Ambulation/Gait assistance: Min guard Gait Distance (Feet): 280 Feet Assistive device: Rolling walker (2 wheels) (Youth sized) Gait Pattern/deviations: Step-through pattern, Decreased stride length, Shuffle, Trunk flexed Gait velocity: decr     General Gait Details: short steps, with cues for posture and to attend to environment around pt   Stairs             Wheelchair Mobility     Tilt Bed    Modified Rankin (Stroke Patients Only)       Balance Overall balance assessment: Needs assistance Sitting-balance support: Feet supported Sitting balance-Leahy Scale: Fair     Standing balance support: Bilateral upper extremity supported, Reliant on assistive device for balance Standing balance-Leahy Scale: Poor Standing balance comment: reliant on UE and external support                            Cognition Arousal/Alertness: Awake/alert Behavior During Therapy: WFL for tasks assessed/performed Overall Cognitive Status: Impaired/Different from baseline Area of Impairment: Orientation, Attention, Memory, Following commands, Safety/judgement, Awareness, Problem solving                 Orientation Level: Disoriented to, Place, Time, Situation Current Attention Level: Focused Memory: Decreased short-term memory Following Commands: Follows one step commands with increased time Safety/Judgement: Decreased awareness of safety, Decreased awareness of deficits Awareness: Intellectual Problem  Solving: Slow processing, Difficulty sequencing, Requires verbal cues, Requires tactile cues General Comments: increased time and increased repetition for commands        Exercises Other  Exercises Other Exercises: LAQ x 10    General Comments General comments (skin integrity, edema, etc.): VSS      Pertinent Vitals/Pain Pain Assessment Pain Assessment: No/denies pain    Home Living                          Prior Function            PT Goals (current goals can now be found in the care plan section) Acute Rehab PT Goals Patient Stated Goal: did not specifically state; agrees to getting up and walking Progress towards PT goals: Progressing toward goals    Frequency    Min 3X/week      PT Plan Current plan remains appropriate    Co-evaluation              AM-PAC PT "6 Clicks" Mobility   Outcome Measure  Help needed turning from your back to your side while in a flat bed without using bedrails?: A Little Help needed moving from lying on your back to sitting on the side of a flat bed without using bedrails?: A Little Help needed moving to and from a bed to a chair (including a wheelchair)?: A Little Help needed standing up from a chair using your arms (e.g., wheelchair or bedside chair)?: A Little Help needed to walk in hospital room?: A Little Help needed climbing 3-5 steps with a railing? : A Lot 6 Click Score: 17    End of Session Equipment Utilized During Treatment: Gait belt Activity Tolerance: Patient tolerated treatment well Patient left: with call bell/phone within reach;in chair;with chair alarm set Nurse Communication: Mobility status;Need for lift equipment PT Visit Diagnosis: Muscle weakness (generalized) (M62.81);Other abnormalities of gait and mobility (R26.89);Unsteadiness on feet (R26.81)     Time: 2536-6440 PT Time Calculation (min) (ACUTE ONLY): 23 min  Charges:    $Gait Training: 8-22 mins $Self Care/Home Management: 8-22 PT General Charges $$ ACUTE PT VISIT: 1 Visit                     Selena Branch,PT Acute Rehab Services [phone]    Selena Branch 03/14/2023, 1:26 PM

## 2023-03-14 NOTE — Progress Notes (Signed)
Speech Language Pathology Treatment: Dysphagia  Patient Details Name: Selena Branch MRN: [id number] DOB: 08/23/1941 Today's Date: 03/14/2023 Time: 1205-1220 SLP Time Calculation (min) (ACUTE ONLY): 15 min  Assessment / Plan / Recommendation Clinical Impression  Pt seen for dysphagia f/u tx session for potential progression of diet (full liquids); pt able to follow directives, answer questions and eager to increase variety of intake.  Pt required min A for intake d/t hand tremoring with cues to consume small bites/sips vs larger volume of thin as she reported "getting choked occasionally when consuming larger amounts." Pt did not exhibit overt s/sx of aspiration with consumed consistencies including thin via cup, puree and solids.  Min prolonged mastication efforts noted and pt needs A with preparing food for consumption, so Dysphagia 3(mechanical soft)/thin liquids recommended.  ST will f/u briefly for diet tolerance in acute setting.  HPI HPI: Patient is an 82 y.o. female with PMH: HTN, HLD, recent admit for seizures with aphasia from 5/24-6/17/24. She was discharged to SNF from hospital on 6/17 and disharged from SNF to home with family a few days prior to current admission. She recently presented to the ED on 03/10/23 with c/o constipation which was confirmed on CT scan. She returned to ED on 03/12/23 with c/o nausea and vomiting and family reporting she has not tolerated any oral intake. She was lethargic in ED. She become hypotensive when being transferred from ED to unit and continued to be lethargic. She was made NPO until more alert and after SLP swallow evaluation, she was placed on full liquid diet.  ST f/u for diet progression/dysphagia tx.      SLP Plan  Continue with current plan of care      Recommendations for follow up therapy are one component of a multi-disciplinary discharge planning process, led by the attending physician.  Recommendations may be updated based on patient status,  additional functional criteria and insurance authorization.    Recommendations  Diet recommendations: Dysphagia 3 (mechanical soft);Thin liquid Liquids provided via: Cup Medication Administration: Whole meds with liquid (or in puree if larger/split) Supervision: Patient able to self feed;Staff to assist with self feeding Compensations: Minimize environmental distractions;Slow rate;Small sips/bites Postural Changes and/or Swallow Maneuvers: Seated upright 90 degrees                  Oral care BID   Other (comment) (TBD) Dysphagia, unspecified (R13.10)     Continue with current plan of care     Pat Beck Cofer,M.S., CCC-SLP  03/14/2023, 1:41 PM

## 2023-03-14 NOTE — TOC Initial Note (Addendum)
Transition of Care [facility], [place]) - Initial/Assessment Note    Patient Details  Name: Selena Branch MRN: [id number] Date of Birth: 03-05-1941  Transition of Care [facility]) CM/SW Contact:    Gala Lewandowsky, RN Phone Number: 03/14/2023, 12:48 PM  Clinical Narrative: Risk for readmission assessment completed. PTA patient was from home with spouse. Daughter states patient will have constant or frequent supervision from her and the patients spouse. Per [name], the patient was just released from SNF and [facility] has been arranged for home health services. [facility] Liaison is aware that the patient is hospitalized. [name] is checking with the facility to see if they ordered the bedside commode. Case Manager will continue to follow for additional transition of care needs.                Expected Discharge Plan: Home w Home Health Services Barriers to Discharge: Continued Medical Work up   Patient Goals and CMS Choice Patient states their goals for this hospitalization and ongoing recovery are:: to return to daughters house.   Choice offered to / list presented to : NA (Active with [facility])    Expected Discharge Plan and Services In-house Referral: NA Discharge Planning Services: CM Consult Post Acute Care Choice: Home Health, Resumption of Svcs/PTA Provider Living arrangements for the past 2 months: Single Family Home                   DME Agency: NA  HH Agency: [facility] Date [facility] Agency Contacted: 03/14/23 Time HH Agency Contacted: [phone] Representative spoke with at [facility] Agency: Haywood Lasso  Prior Living Arrangements/Services Living arrangements for the past 2 months: Single Family Home Lives with:: Spouse Patient language and need for interpreter reviewed:: Yes Do you feel safe going back to the place where you live?: Yes      Need for Family Participation in Patient Care: Yes (Comment) Care giver support system in place?: Yes (comment) Current home services: DME  (Has a rolling walker) Criminal Activity/Legal Involvement Pertinent to Current Situation/Hospitalization: No - Comment as needed  Activities of Daily Living Home Assistive Devices/Equipment: Walker (specify type) ADL Screening (condition at time of admission) Patient's cognitive ability adequate to safely complete daily activities?: Yes Is the patient deaf or have difficulty hearing?: No Does the patient have difficulty seeing, even when wearing glasses/contacts?: No Does the patient have difficulty concentrating, remembering, or making decisions?: No Patient able to express need for assistance with ADLs?: Yes Does the patient have difficulty dressing or bathing?: Yes Independently performs ADLs?: No Communication: Needs assistance Is this a change from baseline?: Change from baseline, expected to last <3 days Dressing (OT): Needs assistance Is this a change from baseline?: Change from baseline, expected to last <3days Grooming: Needs assistance Is this a change from baseline?: Change from baseline, expected to last <3 days Feeding: Needs assistance Is this a change from baseline?: Change from baseline, expected to last <3 days Bathing: Needs assistance Is this a change from baseline?: Change from baseline, expected to last <3 days Toileting: Needs assistance Is this a change from baseline?: Change from baseline, expected to last <3 days In/Out Bed: Needs assistance Is this a change from baseline?: Change from baseline, expected to last <3 days Walks in Home: Needs assistance Is this a change from baseline?: Change from baseline, expected to last <3 days Does the patient have difficulty walking or climbing stairs?: Yes (only recently due to illness) Weakness of Legs: None Weakness of Arms/Hands: None  Permission Sought/Granted Permission sought to share information with : Family Supports, Magazine features editor, Administrator, Civil Service granted to share information with :  Yes, Verbal Permission Granted  Share Information with NAME: Arline Asp  Permission granted to share info w AGENCY: [facility]        Emotional Assessment Appearance:: Appears stated age Attitude/Demeanor/Rapport: Unable to Assess Affect (typically observed): Unable to Assess Orientation: : Oriented to Self Alcohol / Substance Use: Not Applicable Psych Involvement: No (comment)  Admission diagnosis:  Hypokalemia [E87.6] Intractable nausea and vomiting [R11.2] Nausea and vomiting, unspecified vomiting type [R11.2] Patient Active Problem List   Diagnosis Date Noted   Intractable nausea and vomiting 03/13/2023   Hematoma of rectus sheath 02/14/2023   Malnutrition of moderate degree 02/12/2023   Aphasia 02/04/2023   Seizure (HCC): Unwitnessed 02/04/2023   Adnexal mass 01/09/2021   Encounter for immunotherapy    Intracerebral hemorrhage 09/17/2019   Intracranial bleed (HCC) 09/16/2019   Encephalopathy acute    Essential hypertension 09/15/2019   Dyslipidemia 09/15/2019   Acute ITP (HCC)    Thrombocytopenia (HCC) 08/30/2019   Anemia    CLL (chronic lymphocytic leukemia) (HCC)    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   PCP:  Gweneth Dimitri, MD Pharmacy:   [facility] [address] [place] - [address] [place] [address] Phone: [phone] Fax: [phone]  [facility] [address] [place] [address] Phone: [phone] Fax: [phone]  [facility] [place], [place] - [address] [place] [address] Phone: [phone] Fax: [phone]  [facility] [address] [place] [address] Phone: [phone] Fax: [phone]  Social Determinants of Health (SDOH) Social History: SDOH Screenings   Food Insecurity: No Food Insecurity (03/13/2023)  Housing: Low  Risk  (03/13/2023)  Transportation Needs: No Transportation Needs (03/13/2023)  Utilities: Not At Risk (03/13/2023)  Depression (PHQ2-9): Low Risk  (09/28/2019)  Tobacco Use: Low Risk  (03/12/2023)   Readmission Risk Interventions    03/14/2023   12:43 PM  Readmission Risk Prevention Plan  Transportation Screening Complete  HRI or Home Care Consult Complete  Social Work Consult for Recovery Care Planning/Counseling Complete  Palliative Care Screening Not Applicable  Medication Review Oceanographer) Referral to Pharmacy

## 2023-03-15 LAB — BASIC METABOLIC PANEL
Anion gap: 7 (ref 5–15)
BUN: <5 mg/dL — ABNORMAL LOW (ref 8–23)
CO2: 23 mmol/L (ref 22–32)
Calcium: 7.9 mg/dL — ABNORMAL LOW (ref 8.9–10.3)
Chloride: 108 mmol/L (ref 98–111)
Creatinine, Ser: 0.6 mg/dL (ref 0.44–1.00)
GFR, Estimated: >60 mL/min (ref >60)
Glucose, Bld: 84 mg/dL (ref 70–99)
Potassium: 3.5 mmol/L (ref 3.5–5.1)
Sodium: 138 mmol/L (ref 135–145)

## 2023-03-15 LAB — URINE CULTURE

## 2023-03-15 LAB — GLUCOSE, CAPILLARY
Glucose-Capillary: 84 mg/dL (ref 70–99)
Glucose-Capillary: 96 mg/dL (ref 70–99)

## 2023-03-15 MED ORDER — AMLODIPINE BESYLATE 5 MG PO TABS
5.0000 mg | ORAL_TABLET | Freq: Every day | ORAL | Status: DC
  Administered 2023-03-15: 5 mg via ORAL
  Filled 2023-03-15: qty 1

## 2023-03-15 NOTE — Progress Notes (Signed)
Physical Therapy Treatment Patient Details Name: Selena Branch MRN: [id number] DOB: 10-15-1940 Today's Date: 03/15/2023   History of Present Illness Pt is an 82 y/o F presenting to ED On 6/29 with abdominal pain and constipation, admitted for intractible n/v. 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    PT Comments  Pt received in supine and eager for mobility. Pt reporting feeling better today and demonstrates improved activity tolerance this session. Pt demonstrating impaired short term memory by repeating herself multiple times throughout the session. Pt able to perform all mobility with up to min guard for safety. Pt able to tolerate increased gait distance and a stair trial this session. Pt demonstrating improved stability with RW support and slow, steady gait. Pt reports eagerness to return home with family and states that she has a RW at home. Pt continues to benefit from PT services to progress toward functional mobility goals.     Assistance Recommended at Discharge Frequent or constant Supervision/Assistance  If plan is discharge home, recommend the following:  Can travel by private vehicle    A lot of help with walking and/or transfers;A lot of help with bathing/dressing/bathroom;Direct supervision/assist for medications management;Direct supervision/assist for financial management;Assistance with cooking/housework;Assist for transportation;Help with stairs or ramp for entrance   Yes  Equipment Recommendations  Rolling walker (2 wheels)    Recommendations for Other Services       Precautions / Restrictions Precautions Precautions: Fall Precaution Comments: seizure Restrictions Weight Bearing Restrictions: No     Mobility  Bed Mobility Overal bed mobility: Needs Assistance Bed Mobility: Supine to Sit, Sit to Supine      Supine to sit: Supervision, HOB elevated Sit to supine: Min guard, HOB elevated   General bed mobility comments: increased time    Transfers Overall transfer level: Needs assistance Equipment used: Rolling walker (2 wheels) Transfers: Sit to/from Stand Sit to Stand: Min guard           General transfer comment: From EOB x2 with min guard for safety, but no unsteadiness noted.    Ambulation/Gait Ambulation/Gait assistance: Min guard Gait Distance (Feet): 320 Feet Assistive device: Rolling walker (2 wheels) Gait Pattern/deviations: Step-through pattern, Decreased stride length Gait velocity: decr     General Gait Details: Pt improving RW proximity and step length this session, however continuing to demonstrate a slow step-through pattern   Stairs Stairs: Yes Stairs assistance: Min guard Stair Management: One rail Right Number of Stairs: 3 General stair comments: Pt able to perform stair trial with steady power up to each step and no unsteadiness noted. Min guard for safety and cues for pacing        Balance Overall balance assessment: Needs assistance Sitting-balance support: Feet supported Sitting balance-Leahy Scale: Fair Sitting balance - Comments: sitting EOB   Standing balance support: Bilateral upper extremity supported, Reliant on assistive device for balance, During functional activity Standing balance-Leahy Scale: Fair Standing balance comment: with RW support                            Cognition Arousal/Alertness: Awake/alert Behavior During Therapy: WFL for tasks assessed/performed Overall Cognitive Status: Within Functional Limits for tasks assessed  General Comments: Pt repeating herself multiple times throughout session        Exercises      General Comments General comments (skin integrity, edema, etc.): VSS on RA      Pertinent Vitals/Pain Pain Assessment Pain Assessment:  Faces Pain Score: 0-No pain     PT Goals (current goals can now be found in the care plan section) Acute Rehab PT Goals Patient Stated Goal: did not specifically state; agrees to getting up and walking PT Goal Formulation: Patient unable to participate in goal setting Time For Goal Achievement: 03/27/23 Potential to Achieve Goals: Good Progress towards PT goals: Progressing toward goals    Frequency    Min 3X/week      PT Plan Current plan remains appropriate       AM-PAC PT "6 Clicks" Mobility   Outcome Measure  Help needed turning from your back to your side while in a flat bed without using bedrails?: A Little Help needed moving from lying on your back to sitting on the side of a flat bed without using bedrails?: A Little Help needed moving to and from a bed to a chair (including a wheelchair)?: A Little Help needed standing up from a chair using your arms (e.g., wheelchair or bedside chair)?: A Little Help needed to walk in hospital room?: A Little Help needed climbing 3-5 steps with a railing? : A Little 6 Click Score: 18    End of Session Equipment Utilized During Treatment: Gait belt Activity Tolerance: Patient tolerated treatment well Patient left: with call bell/phone within reach;in bed Nurse Communication: Mobility status PT Visit Diagnosis: Muscle weakness (generalized) (M62.81);Other abnormalities of gait and mobility (R26.89);Unsteadiness on feet (R26.81)     Time: 1610-9604 PT Time Calculation (min) (ACUTE ONLY): 21 min  Charges:    $Gait Training: 8-22 mins PT General Charges $$ ACUTE PT VISIT: 1 Visit                     Johny Shock, PTA Acute Rehabilitation Services Secure Chat Preferred  [phone]    Johny Shock 03/15/2023, 4:12 PM

## 2023-03-15 NOTE — Progress Notes (Signed)
PROGRESS NOTE Selena Branch  ZOX:[id number] DOB: 01-Apr-1941 DOA: 03/12/2023 PCP: Gweneth Dimitri, MD  Brief Narrative/Hospital Course: 82 year old female from home history of CLL intracranial hemorrhage ITP admitted late May for seizure and aphasia 5/25 - 6/17 and discharged to SNF, returned to home few days ago and complaining of persistent nausea, vomiting x 3 days PTA, recently seen in the ED for same on 6/27 with constipation and had CT scan discharged home. Patient has not been able to keep anything orally down Since her diagnosis.  She has not visualized sensation and takes Seroquel In the ED BP stable not hypoxic Labs reviewed WBC 9.4  hemoglobin 13.9, platelet count 216.  Serum potassium 3.0, glucose 103.  Magnesium 2.3.  Renal function is stable lactic acid normal CT abdomen pelvis with contrast>> no change from recent CT multiple pelvic fluid collection reflecting evolving hematomas 6.7 cm left ovarian/adnexal cyst chronic advise annual follow-up as clinically warranted. Patient is admitted for intermittent nausea vomiting and constipation along with metabolic encephalopathy Palliative consulted- DNR, no feeding tube and treat what is treatable. SLP evaluated-added CLD>Advanced to DYS 3 (MSD) Urine cx growing E fecalis    Subjective: Patient seen and examined this morning S/P much more alert awake interactive communicative Oriented to self current place current month but not president or year   Assessment and Plan: Principal Problem:   Intractable nausea and vomiting   Intractable nausea vomiting Constipation: Etiology suspect form constipation, worsened by UTI. Per daughter onset of nausea after taking seizure meds after getting discharged from skilled nursing facility but there was no change in dose of Vimpat.   CT abd moderate colonic stool burden on 6/29, and on 6/27 Large colonic stool burden, with stool distending the rectum. Had good BM on after enema in ED x2 on her first ED  visit.  She has been refusing to eat, taking pills with dr peppers at home since. Speech has cleared for diet> continue DYS 3 diet, continue antiemetics gentle IV fluid hydration nausea has improved continue constipation management. Last BM 7/1  Acute metabolic encephalopathy Recent seizure related admission from 5/25 - 6/17, missed SED  on Saturday/sunday. Hallucinations E faecalis UTI: Encephalopathy suspect multifactorial in the setting of dehydration/coistipation, recent hospitalization and seizure and UTI.She has had some degree of delirium ongoing since last discharge. Overall appears more alert awake oriented x 2. Continue Unasyn for UTI pending urine culture, cont IV fluid hydration, constipation management PT OT fall precaution.  On last admit MRI brain, LTM EEG no seizure no acute finding-she was managed treated for delirium.She was discharged on Seroquel 50 in the bedtime and 25 PRN and also was on Ativan 1 mg as needed for seizure>Continue home Vimpat, not needing Seroquel at this time.   Hypokalemia resolved    Hypertension: BP overall stable at times in 110s to 120s, continue increased dose of amlodipine at 2.5 mg, Coreg 3.125 twice daily.   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 Family worried about patient's condition but currently she is hemodynamically stable alert akwake and improving.Overall prognosis does not appear bright, palliative care following closely continue to treat what is treatable DNR, no feeding tube   DVT prophylaxis: SCDs Start: 03/13/23 0408 Code Status:  Code Status: DNR Family Communication: plan of care discussed with  patient.  Plan of care was discussed with patient's daughter 6/30 and 7/1 Patient status is: Inpatient because of encephalopathy, UTI Level of care: Telemetry Medical   Dispo: The patient is from: home            Anticipated disposition: home 1-2 days>family not consideringSNF at this time Objective: Vitals last 24 hrs: Vitals:   03/14/23 1955 03/14/23 2300 03/15/23 0414 03/15/23 0816  BP:  119/60 122/67 (!) 153/65  Pulse: 69  67 60  Resp:   19 17  Temp: 97.9 F (36.6 C) 98.3 F (36.8 C) 98.1 F (36.7 C) 98.1 F (36.7 C)  TempSrc: Oral Axillary Oral Oral  SpO2:  98% 98% 100%  Weight:      Height:       Weight change:   Physical Examination: General exam: alert awake, oriented to self month place- not to president/AF  HEENT:Oral mucosa moist, Ear/Nose WNL grossly Respiratory system: Bilaterally clear BS,no use of accessory muscle Cardiovascular system: S1 & S2 +, No JVD. Gastrointestinal system: Abdomen soft,NT,ND, BS+ Nervous System: Alert, awake, moving her extremities,and following commands. Extremities: LE edema neg,distal peripheral pulses palpable and warm.  Skin: No rashes,no icterus. MSK: thin muscle bulk,tone, power   Medications reviewed:  Scheduled Meds:  amLODipine  2.5 mg Oral QHS   atorvastatin  40 mg Oral Daily   brinzolamide  1 drop Both Eyes TID   And   brimonidine  1 drop Both Eyes TID   calcium-vitamin D  1 tablet Oral Daily   carvedilol  3.125 mg Oral BID WC   eltrombopag  25 mg Oral UD   feeding supplement  1 Container Oral TID BM   latanoprost  1 drop Both Eyes QHS   multivitamin with minerals  1 tablet Oral Daily   pantoprazole  40 mg Oral BID   polyethylene glycol  17 g Oral Daily   senna-docusate  1 tablet Oral BID   Continuous Infusions:  ampicillin (OMNIPEN) IV 1 g (03/15/23 0510)   lacosamide (VIMPAT) IV 200 mg (03/14/23 2256)   lactated ringers 75 mL/hr at 03/15/23 0420    Diet Order             DIET DYS 3 Room service  appropriate? Yes; Fluid consistency: Thin  Diet effective now                  Intake/Output Summary (Last 24 hours) at 03/15/2023 0901 Last data filed at 03/15/2023 0418 Gross per 24 hour  Intake 1477.71 ml  Output 900 ml  Net 577.71 ml    Net IO Since Admission: 1,262.39 mL [03/15/23 0901]  Wt Readings from Last 3 Encounters:  03/13/23 44.9 kg  03/10/23 46.3 kg  02/28/23 51.2 kg     Unresulted Labs (From admission, onward)     Start     Ordered   03/15/23 0500  Basic metabolic panel  Daily,   R     Question:  Specimen collection method  Answer:  Lab=Lab collect   03/14/23 1120          Data Reviewed: I have personally reviewed following labs and imaging studies CBC: Recent Labs  Lab 03/10/23 1134 03/12/23 1904 03/13/23 0724 03/14/23 0311  WBC 8.2 9.4 6.5 9.7  NEUTROABS 5.2  --   --   --   HGB 13.4 13.9 12.8 13.0  HCT 40.4 41.9 39.3 40.1  MCV 94.0 92.3 95.4  92.4  PLT 198 216 179 178    Basic Metabolic Panel: Recent Labs  Lab 03/10/23 1134 03/12/23 1904 03/12/23 2123 03/13/23 0724 03/14/23 0311 03/15/23 0224  NA 139 142  --  142 139 138  K 4.1 3.0*  --  3.4* 3.8 3.5  CL 101 104  --  110 106 108  CO2 30 25  --  21* 22 23  GLUCOSE 102* 103*  --  82 101* 84  BUN 13 18  --  12 6* <5*  CREATININE 0.75 0.78  --  0.69 0.65 0.60  CALCIUM 9.1 8.7*  --  7.4* 8.1* 7.9*  MG  --   --  2.3  --   --   --     GFR: Estimated Creatinine Clearance: 34.4 mL/min (by C-G formula based on SCr of 0.6 mg/dL). Liver Function Tests: Recent Labs  Lab 03/12/23 1904 03/14/23 0311  AST 31 27  ALT 22 19  ALKPHOS 77 69  BILITOT 1.0 1.0  PROT 6.6 5.9*  ALBUMIN 3.7 3.3*    Recent Labs  Lab 03/12/23 1904  LIPASE 30    CBG: Recent Labs  Lab 03/14/23 0804 03/14/23 1205 03/14/23 1627 03/14/23 2101 03/15/23 0717  GLUCAP 88 94 100* 114* 84    Recent Labs  Lab 03/13/23 0447  LATICACIDVEN 0.8     Recent Results (from the past 240 hour(s))  Urine Culture      Status: Abnormal (Preliminary result)   Collection Time: 03/13/23  7:30 AM   Specimen: Urine, Random  Result Value Ref Range Status   Specimen Description URINE, RANDOM  Final   Special Requests   Final    URINE, CLEAN CATCH Performed at [facility], [address] [place], [place] [address]    Culture >=100,000 COLONIES/mL ENTEROCOCCUS FAECALIS (A)  Final   Report Status PENDING  Incomplete    Antimicrobials: Anti-infectives (From admission, onward)    Start     Dose/Rate Route Frequency Ordered Stop   03/14/23 1500  ampicillin (OMNIPEN) 1 g in sodium chloride 0.9 % 100 mL IVPB        1 g 300 mL/hr over 20 Minutes Intravenous Every 8 hours 03/14/23 1405     03/13/23 0445  cefTRIAXone (ROCEPHIN) 2 g in sodium chloride 0.9 % 100 mL IVPB  Status:  Discontinued        2 g 200 mL/hr over 30 Minutes Intravenous Every 24 hours 03/13/23 0431 03/14/23 1405      Culture/Microbiology    Component Value Date/Time   SDES URINE, RANDOM 03/13/2023 0730   SPECREQUEST  03/13/2023 0730    URINE, CLEAN CATCH Performed at [facility], [address], [place], [place] [address]    CULT >=100,000 COLONIES/mL ENTEROCOCCUS FAECALIS (A) 03/13/2023 0730   REPTSTATUS PENDING 03/13/2023 0730    Radiology Studies: No results found.   LOS: 2 days   Lanae Boast, MD [facility]  03/15/2023, 9:01 AM

## 2023-03-15 NOTE — Progress Notes (Signed)
Palliative Care Progress Note, Assessment & Plan   Patient Name: Selena Branch       Date: 03/15/2023 DOB: Jul 17, 1941  Age: 82 y.o. MRN#: [id number] Attending Physician: Lanae Boast, MD Primary Care Physician: Gweneth Dimitri, MD Admit Date: 03/12/2023  Subjective: Patient is sitting up in bed in no apparent distress.  She acknowledges my presence and is able to make her wishes known.  RN is at bedside administering a.m. medications.  No family friends present during my visit.  HPI: 82 y.o. female  with past medical history of CLL, ITP, and recent hospitalization (5/25 - 6/17) for seizures and aphasia.  Patient was discharged to [facility].  She was discharged from SNF to home about a week ago.  Family endorses patient felt well after rehab but soon after experienced nausea, vomiting, and constipation at home.  Patient was admitted on 03/12/2023 with intractable nausea with vomiting and constipation.   Patient is being treated with antiemetics, IV fluids, and aggressive bowel regimen.  Patient also receiving ceftriaxone empirically for suspected UTI.   PMT was consulted to discuss goals of care.   Summary of counseling/coordination of care: After reviewing the patient's chart and assessing the patient at bedside, I spoke with patient in regards to symptom burden and plan of care.  Symptoms assessed.  Patient endorses her stomach feels well and she has no acute complaints of nausea, vomiting, diarrhea, or constipation.  She endorses she has a bowel movement approximately 1 time per week and that she feels that she is back to her normal regimen.  No adjustments to medications needed at this time.  I spoke with patient in regards to plan and goals of care.  Patient shares she wants to do what she can to be able  to get back home.  She says she is feeling stronger every day and believes this is a reasonable goal to obtain.  We discussed that plan remains for patient's mentation to continue to improve and for her to likely discharge back home when medically stable.  TOC following closely for discharge planning as well.  After meeting with the patient, I spoke with patient's daughter/HCPOA over the phone.  Conveyed above discussions.  I conveyed to both patient and her daughter that goals are clear and symptom burden remains low.  Patient is improving with current regimen.Patient/family have PMT contact information and were encouraged to contact PMT with any future acute palliative needs.   PMT will monitor the patient peripherally and shadow her chart. Please re-engage with PMT if goals change, at patient/family's request, or if patient's health deteriorates during hospitalization.    Patient and daughter were appreciative of palliative support and had no further palliative needs at this time.  Physical Exam Vitals reviewed.  Constitutional:      General: She is not in acute distress.    Appearance: She is normal weight.  HENT:     Head: Normocephalic.  Cardiovascular:     Rate and Rhythm: Normal rate.  Pulmonary:     Effort: Pulmonary effort is normal.  Abdominal:     General: Bowel sounds are normal.     Palpations: Abdomen is soft.  Skin:  General: Skin is warm and dry.  Neurological:     Mental Status: She is alert.  Psychiatric:        Mood and Affect: Mood normal. Mood is not anxious or depressed.        Behavior: Behavior normal.             Total Time 25 minutes   Kayliegh Boyers L. Manon Hilding, FNP-BC Palliative Medicine Team Team Phone # [phone]

## 2023-03-15 NOTE — Progress Notes (Signed)
Speech Language Pathology Treatment: Dysphagia  Patient Details Name: Selena Branch MRN: [id number] DOB: 01/24/1941 Today's Date: 03/15/2023 Time: 1308-6578 SLP Time Calculation (min) (ACUTE ONLY): 13 min  Assessment / Plan / Recommendation Clinical Impression  Patient seen by SLP for skilled treatment focused on dysphagia goals. She was awake, alert and pleasant. She told SLP that she had not had breakfast but that a doctor was in the room earlier who was going to check on that. While SLP in room, patient's meal tray arrived. After SLP helped patient reposition in bed, she was able to perform meal prep (opening containers, etc) and feed herself without difficulty. No overt s/s aspiration observed and no significant delays in mastication or swallow initiation observed. SLP recommending continue on this diet and will f/u at least one more time to determine if she can advance to regular texture solids.    HPI HPI: Patient is an 82 y.o. female with PMH: HTN, HLD, recent admit for seizures with aphasia from 5/24-6/17/24. She was discharged to SNF from hospital on 6/17 and disharged from SNF to home with family a few days prior to current admission. She recently presented to the ED on 03/10/23 with c/o constipation which was confirmed on CT scan. She returned to ED on 03/12/23 with c/o nausea and vomiting and family reporting she has not tolerated any oral intake. She was lethargic in ED. She become hypotensive when being transferred from ED to unit and continued to be lethargic. She was made NPO until more alert and after SLP swallow evaluation, she was placed on full liquid diet.  ST f/u for diet progression/dysphagia tx.      SLP Plan  Continue with current plan of care      Recommendations for follow up therapy are one component of a multi-disciplinary discharge planning process, led by the attending physician.  Recommendations may be updated based on patient status, additional functional criteria and  insurance authorization.    Recommendations  Diet recommendations: Dysphagia 3 (mechanical soft);Thin liquid Liquids provided via: Cup;Straw Medication Administration: Whole meds with liquid Supervision: Patient able to self feed;Intermittent supervision to cue for compensatory strategies Compensations: Minimize environmental distractions;Slow rate;Small sips/bites Postural Changes and/or Swallow Maneuvers: Seated upright 90 degrees                  Oral care BID   Set up Supervision/Assistance Dysphagia, unspecified (R13.10)     Continue with current plan of care     Angela Nevin, MA, CCC-SLP Speech Therapy

## 2023-03-15 NOTE — Progress Notes (Signed)
Occupational Therapy Treatment Patient Details Name: Selena Branch MRN: [id number] DOB: [birth year] Today's Date: 03/15/2023   History of present illness Pt is an 82 y/o F presenting to ED On 6/29 with abdominal pain and constipation, admitted for intractible n/v. 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   OT comments  Pt progressing towards goals this session, needing min guard for step pivot transfer to chair and overall for bed mobility. Min A for UB ADL. Pt with difficulty extending digits 4 and 5 of L hand but able to use functionally for BADL, provided pt with hot pack and reports improved ROM with warm compress. Pt presenting with impairments listed below, will follow acutely. Continue to recommend HHOT at d/c.    Recommendations for follow up therapy are one component of a multi-disciplinary discharge planning process, led by the attending physician.  Recommendations may be updated based on patient status, additional functional criteria and insurance authorization.    Assistance Recommended at Discharge Frequent or constant Supervision/Assistance  Patient can return home with the following  A little help with walking and/or transfers;A lot of help with bathing/dressing/bathroom;Assistance with cooking/housework;Direct supervision/assist for medications management;Direct supervision/assist for financial management;Help with stairs or ramp for entrance;Assist for transportation   Equipment Recommendations  BSC/3in1    Recommendations for Other Services PT consult    Precautions / Restrictions Precautions Precautions: Fall Precaution Comments: seizure Restrictions Weight Bearing Restrictions: No       Mobility Bed Mobility Overal bed mobility: Needs Assistance Bed Mobility: Supine to Sit     Supine to sit: Min guard           Transfers Overall transfer level: Needs assistance Equipment used: Rolling walker (2 wheels) Transfers: Bed to chair/wheelchair/BSC, Sit to/from Stand Sit to Stand: Min guard     Step pivot transfers: Min guard           Balance Overall balance assessment: Needs assistance Sitting-balance support: Feet supported Sitting balance-Leahy Scale: Fair     Standing balance support: Bilateral upper extremity supported, Reliant on assistive device for balance Standing balance-Leahy Scale: Poor Standing balance comment: reliant on UE and external support                           ADL either performed or assessed with clinical judgement   ADL Overall ADL's : Needs assistance/impaired                 Upper Body Dressing : Minimal assistance;Sitting       Toilet Transfer: Min guard;Ambulation;BSC/3in1;Rolling walker (2 wheels)           Functional mobility during ADLs: Minimal assistance;Rolling walker (2 wheels)      Extremity/Trunk Assessment Upper Extremity Assessment Upper Extremity Assessment: Generalized weakness LUE Deficits / Details: able to actively range digits 1,2 & 5, can passively move digits 3 & 4 ~30*   Lower Extremity Assessment Lower Extremity Assessment: Generalized weakness        Vision   Vision Assessment?: No apparent visual deficits   Perception Perception Perception: Not tested   Praxis Praxis Praxis: Not tested    Cognition Arousal/Alertness: Awake/alert Behavior During Therapy: WFL for tasks assessed/performed Overall Cognitive Status: Within Functional Limits for tasks assessed  General Comments: more alert/communicative this session, no tremoring noted        Exercises      Shoulder Instructions       General Comments VSS on RA    Pertinent Vitals/ Pain       Pain Assessment Pain Assessment: No/denies pain  Home Living                                           Prior Functioning/Environment              Frequency  Min 2X/week        Progress Toward Goals  OT Goals(current goals can now be found in the care plan section)  Progress towards OT goals: Progressing toward goals  Acute Rehab OT Goals Patient Stated Goal: none stated OT Goal Formulation: With patient Time For Goal Achievement: 03/27/23 Potential to Achieve Goals: Good ADL Goals Pt Will Perform Upper Body Dressing: with supervision;sitting Pt Will Perform Lower Body Dressing: with min assist;sitting/lateral leans;sit to/from stand Pt Will Transfer to Toilet: with modified independence;ambulating;regular height toilet Pt Will Perform Tub/Shower Transfer: Tub transfer;Shower transfer;with modified independence;ambulating Additional ADL Goal #1: pt perform BUE functional task with min guard A in prep for ADLs  Plan Discharge plan remains appropriate;Frequency remains appropriate    Co-evaluation                 AM-PAC OT "6 Clicks" Daily Activity     Outcome Measure   Help from another person eating meals?: A Little Help from another person taking care of personal grooming?: A Little Help from another person toileting, which includes using toliet, bedpan, or urinal?: A Lot Help from another person bathing (including washing, rinsing, drying)?: A Lot Help from another person to put on and taking off regular upper body clothing?: A Little Help from another person to put on and taking off regular lower body clothing?: A Lot 6 Click Score: 15    End of Session Equipment Utilized During Treatment: Gait belt;Rolling walker (2 wheels)  OT Visit Diagnosis: Other abnormalities of gait and mobility (R26.89);Other symptoms and signs involving cognitive function;Cognitive communication deficit (R41.841);Muscle weakness (generalized) (M62.81)   Activity Tolerance Patient tolerated treatment well   Patient Left in chair;with call bell/phone  within reach;with chair alarm set   Nurse Communication Mobility status        Time: 1610-9604 OT Time Calculation (min): 17 min  Charges: OT General Charges $OT Visit: 1 Visit OT Treatments $Self Care/Home Management : 8-22 mins  Carver Fila, OTD, OTR/L SecureChat Preferred Acute Rehab [phone]   Carver Fila Koonce 03/15/2023, 12:56 PM

## 2023-03-16 ENCOUNTER — Other Ambulatory Visit: Payer: Self-pay | Admitting: Internal Medicine

## 2023-03-16 ENCOUNTER — Inpatient Hospital Stay: Payer: PPO | Admitting: Hematology

## 2023-03-16 DIAGNOSIS — R112 Nausea with vomiting, unspecified: Secondary | ICD-10-CM | POA: Diagnosis not present

## 2023-03-16 LAB — BASIC METABOLIC PANEL
Anion gap: 11 (ref 5–15)
BUN: 5 mg/dL — ABNORMAL LOW (ref 8–23)
CO2: 25 mmol/L (ref 22–32)
Calcium: 8.4 mg/dL — ABNORMAL LOW (ref 8.9–10.3)
Chloride: 103 mmol/L (ref 98–111)
Creatinine, Ser: 0.65 mg/dL (ref 0.44–1.00)
GFR, Estimated: >60 mL/min (ref >60)
Glucose, Bld: 97 mg/dL (ref 70–99)
Potassium: 3.8 mmol/L (ref 3.5–5.1)
Sodium: 139 mmol/L (ref 135–145)

## 2023-03-16 MED ORDER — CARVEDILOL 3.125 MG PO TABS: 3.1250 mg | ORAL_TABLET | Freq: Two times a day (BID) | ORAL | 0 refills | Status: AC

## 2023-03-16 MED ORDER — AMPICILLIN 500 MG PO CAPS: 500.0000 mg | ORAL_CAPSULE | Freq: Three times a day (TID) | ORAL | 0 refills | Status: AC

## 2023-03-16 MED ORDER — PANTOPRAZOLE SODIUM 40 MG PO TBEC: 40.0000 mg | DELAYED_RELEASE_TABLET | Freq: Two times a day (BID) | ORAL | 0 refills | Status: AC

## 2023-03-16 MED ORDER — DOCUSATE SODIUM 100 MG PO CAPS: 100.0000 mg | ORAL_CAPSULE | Freq: Two times a day (BID) | ORAL | 0 refills | Status: AC

## 2023-03-16 NOTE — Progress Notes (Signed)
Physical Therapy Treatment Patient Details Name: Selena Branch MRN: [id number] DOB: [birth year] Today's Date: 03/16/2023   History of Present Illness Pt is an 82 y/o F presenting to ED On 6/29 with abdominal pain and constipation, admitted for intractible n/v. 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    PT Comments  Patient is agreeable to PT. She is eager to be discharged today. Emphasis on taking her time with mobility transitions and monitoring for any signs of dizziness before ambulating. Occasional cues for rolling walker negotiation with turns and navigational cues required to get back to her room from the hallway. No loss of balance with hallway ambulation using rolling walker. Recommend to continue using the rolling walker for safety and fall prevention. PT will continue to follow to maximize independence and decrease caregiver burden.     Assistance Recommended at Discharge Frequent or constant Supervision/Assistance  If plan is discharge home, recommend the following:  Can travel by private vehicle    A lot of help with walking and/or transfers;A lot of help with bathing/dressing/bathroom;Direct supervision/assist for medications management;Direct supervision/assist for financial management;Assistance with cooking/housework;Assist for transportation;Help with stairs or ramp for entrance   Yes  Equipment Recommendations  Rolling walker (2 wheels)    Recommendations for Other Services       Precautions / Restrictions Precautions Precautions: Fall Restrictions Weight Bearing Restrictions: No     Mobility  Bed Mobility               General bed mobility comments: not assessed as patient sitting up on arrival and post session    Transfers Overall transfer level: Needs assistance Equipment used: Rolling walker (2  wheels) Transfers: Sit to/from Stand Sit to Stand: Supervision           General transfer comment: education to monitor for signs of dizziness with transition changes for safety, with no significant dizziness reported    Ambulation/Gait Ambulation/Gait assistance: Min guard Gait Distance (Feet): 120 Feet Assistive device: Rolling walker (2 wheels) Gait Pattern/deviations: Step-through pattern Gait velocity: decreased     General Gait Details: slow and cautious with no loss of balance. encouraged patient to continue using rolling walker for safety and fall prevention in home setting with standing/ambulation. she did require minimal cues for rolling walker negotiation with turns and navigational cues to get back to her room from the hallway   Stairs             Wheelchair Mobility     Tilt Bed    Modified Rankin (Stroke Patients Only)       Balance Overall balance assessment: Needs assistance Sitting-balance support: Feet supported Sitting balance-Leahy Scale: Fair     Standing balance support: Bilateral upper extremity supported, Reliant on assistive device for balance, During functional activity Standing balance-Leahy Scale: Fair Standing balance comment: with RW support                            Cognition Arousal/Alertness: Awake/alert Behavior During Therapy: WFL for tasks assessed/performed Overall Cognitive Status: Within Functional Limits for tasks assessed                                 General Comments: patient is able to follow single step commands without  difficulty. she does perseverate on being able to leave the [facility] today. she was unable to recall her home phone number (wanted to call her spouse?)        Exercises      General Comments        Pertinent Vitals/Pain Pain Assessment Pain Assessment: No/denies pain    Home Living                          Prior Function            PT Goals  (current goals can now be found in the care plan section) Acute Rehab PT Goals Patient Stated Goal: to go home as soon as possible PT Goal Formulation: With patient Time For Goal Achievement: 03/27/23 Potential to Achieve Goals: Good Progress towards PT goals: Progressing toward goals    Frequency    Min 3X/week      PT Plan Current plan remains appropriate    Co-evaluation              AM-PAC PT "6 Clicks" Mobility   Outcome Measure  Help needed turning from your back to your side while in a flat bed without using bedrails?: A Little Help needed moving from lying on your back to sitting on the side of a flat bed without using bedrails?: A Little Help needed moving to and from a bed to a chair (including a wheelchair)?: A Little Help needed standing up from a chair using your arms (e.g., wheelchair or bedside chair)?: A Little Help needed to walk in hospital room?: A Little Help needed climbing 3-5 steps with a railing? : A Little 6 Click Score: 18    End of Session   Activity Tolerance: Patient tolerated treatment well Patient left: in bed;with call bell/phone within reach Nurse Communication: Mobility status PT Visit Diagnosis: Muscle weakness (generalized) (M62.81);Other abnormalities of gait and mobility (R26.89);Unsteadiness on feet (R26.81)     Time: 4098-1191 PT Time Calculation (min) (ACUTE ONLY): 20 min  Charges:    $Gait Training: 8-22 mins PT General Charges $$ ACUTE PT VISIT: 1 Visit                     Donna Bernard, PT, MPT   Ina Homes 03/16/2023, 1:13 PM

## 2023-03-16 NOTE — Plan of Care (Signed)
  Problem: SLP Dysphagia Goals Goal: Patient will utilize recommended strategies Description: Patient will utilize recommended strategies during swallow to increase swallowing safety with Outcome: Adequate for Discharge   Problem: Acute Rehab OT Goals (only OT should resolve) Goal: Pt. Will Perform Upper Body Dressing Outcome: Adequate for Discharge Goal: Pt. Will Perform Lower Body Dressing Outcome: Adequate for Discharge Goal: Pt. Will Transfer To Toilet Outcome: Adequate for Discharge Goal: Pt. Will Perform Tub/Shower Transfer Outcome: Adequate for Discharge Goal: OT Additional ADL Goal #1 Outcome: Adequate for Discharge   Problem: Acute Rehab PT Goals(only PT should resolve) Goal: Pt Will Go Supine/Side To Sit Outcome: Adequate for Discharge Goal: Patient Will Transfer Sit To/From Stand Outcome: Adequate for Discharge Goal: Pt Will Ambulate Outcome: Adequate for Discharge Goal: Pt Will Go Up/Down Stairs Outcome: Adequate for Discharge

## 2023-03-16 NOTE — Plan of Care (Signed)

## 2023-03-16 NOTE — Care Management Important Message (Signed)
Important Message  Patient Details  Name: Selena Branch MRN: [id number] Date of Birth: 1940/12/05   Medicare Important Message Given:  Yes     Renie Ora 03/16/2023, 8:44 AM

## 2023-03-16 NOTE — Progress Notes (Signed)
Explained discharge instructions to patient. Reviewed follow up appointment and next medication administration times. Also reviewed education. Patient verbalized having an understanding for instructions given. All belongings are in the patient's possession. IV and telemetry were removed by the floor staff. No other needs verbalized. Transporting downstairs for discharge.

## 2023-03-16 NOTE — Discharge Summary (Signed)
Physician Discharge Summary  Selena Branch GNF:[id number] DOB: 1940/12/15 DOA: 03/12/2023  PCP: Selena Dimitri, MD  Admit date: 03/12/2023 Discharge date: 03/16/2023  Admitted From: Home Disposition: Home with home health  Recommendations for Outpatient Follow-up:  Follow up with PCP in 1-2 weeks Please obtain BMP/CBC in one week   Home Health: PT/OT Equipment/Devices: Rolling walker  Discharge Condition: Stable CODE STATUS: DNR Diet recommendation: Regular diet, nutritional supplements  Discharge summary: 82 year old with history of CLL, intracranial hemorrhage, ITP, history of seizure and aphasia with recent admission to the hospital admitted with persistent nausea vomiting for 3 days prior to admit, recently seen in the emergency room for constipation and discharged home.  She was admitted to the hospital because she was not able to take any reliable oral intake.  In the emergency room hemodynamically stable.  CT scan abdomen pelvis with contrast showed multiple pelvic fluid collection reflecting evolving hematoma, 6.7 cm left ovarian cyst which was chronic.  Treated with aggressive bowel regimen, treated with ampicillin for urinary tract infection.  Did good clinical recovery with conservative management and going home today.  Intractable nausea and vomiting, likely secondary to constipation and aggravated by acute UTI. Patient was treated with aggressive bowel regimen with good response.  She will go home with scheduled stool softener, scheduled MiraLAX.  She will avoid constipation.  She will use more medications if needed to avoid constipation.  Currently with normal bowel function and tolerating regular diet.  Acute UTI, present on admission.  Urine culture with E faecalis.  Treated with ampicillin in the hospital.  Will complete 7 days of therapy with 5 additional days of oral ampicillin.  Symptomatically improved.  Acute metabolic encephalopathy: Suspect multifactorial in the  setting of dehydration/constipation/recent hospitalization and UTI. Her mental status has improved. Did not have any evidence of new seizures. Patient will continue Vimpat, Ativan for breakthrough seizures.  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  Hemoglobin has been stable.  Follow-up with hematology.  Patient medically stable today.  Able to go home with home health PT OT.   Discharge Diagnoses:  Principal Problem:   Intractable nausea and vomiting    Discharge Instructions  Discharge Instructions     Diet - low sodium heart healthy   Complete by: As directed    Increase activity slowly   Complete by: As directed       Allergies as of 03/16/2023       Reactions   Codeine Nausea Only   Evista [raloxifene] Other (See Comments)   Leg cramps   Actonel [risedronate] Other (See Comments)   GI intolerance   Boniva [ibandronic Acid] Other (See Comments)   GI intolerance   Fosamax [alendronate] Other (See Comments)   GI intolerance   Prednisone Other (See Comments)   Aggression Irritability        Medication List     STOP taking these medications    senna 8.6 MG Tabs tablet Commonly known as: SENOKOT       TAKE these medications    amLODipine 10 MG tablet Commonly known as: NORVASC Take 1 tablet (10 mg total) by mouth at bedtime.   ampicillin 500 MG capsule Commonly known as: PRINCIPEN Take 1 capsule (500 mg total) by mouth 3 (three) times daily for 4 days.   atorvastatin 40 MG tablet Commonly known as: LIPITOR Take 40 mg by mouth daily.   CALCIUM + VITAMIN D3 PO Take 1 tablet  by mouth daily.   carvedilol 3.125 MG tablet Commonly known as: COREG Take 1 tablet (3.125 mg total) by mouth 2 (two) times daily with a meal.   docusate sodium 100 MG capsule Commonly known as: COLACE Take 1 capsule (100 mg total) by mouth 2 (two) times daily.   dorzolamide-timolol 2-0.5 % ophthalmic  solution Commonly known as: COSOPT Place 1 drop into both eyes 2 (two) times daily.   eltrombopag 25 MG tablet Commonly known as: PROMACTA Take 1 tablet (25 mg) on Monday Wednesday and Friday .take on an empty stomach, 1 hour before a meal or 2 hours after. What changed:  how much to take how to take this when to take this additional instructions   glycerin adult 2 g suppository Place 1 suppository rectally as needed for constipation.   ibandronate 150 MG tablet Commonly known as: BONIVA Take 150 mg by mouth every 28 (twenty-eight) days.   lacosamide 200 MG Tabs tablet Commonly known as: VIMPAT Take 1 tablet (200 mg total) by mouth 2 (two) times daily.   latanoprost 0.005 % ophthalmic solution Commonly known as: XALATAN Place 1 drop into both eyes at bedtime.   lidocaine 2 % jelly Commonly known as: XYLOCAINE Apply 1 Application topically as needed.   lisinopril 40 MG tablet Commonly known as: ZESTRIL Take 40 mg by mouth daily.   LORazepam 1 MG tablet Commonly known as: Ativan Take 1 tablet (1 mg total) by mouth every 8 (eight) hours as needed for seizure.   ondansetron 4 MG disintegrating tablet Commonly known as: ZOFRAN-ODT Take 1 tablet (4 mg total) by mouth every 8 (eight) hours as needed for nausea or vomiting.   pantoprazole 40 MG tablet Commonly known as: PROTONIX Take 1 tablet (40 mg total) by mouth 2 (two) times daily.   polyethylene glycol powder 17 GM/SCOOP powder Commonly known as: GLYCOLAX/MIRALAX Take 17 g by mouth daily.   QUEtiapine 25 MG tablet Commonly known as: SEROQUEL Take 1 tablet (25 mg total) by mouth daily as needed (agitation). What changed: Another medication with the same name was removed. Continue taking this medication, and follow the directions you see here.   Simbrinza 1-0.2 % Susp Generic drug: Brinzolamide-Brimonidine Place 1 drop into both eyes 2 (two) times daily.   Womens 50+ Multi Vitamin/Min Tabs Take 1 tablet by  mouth daily.        Follow-up Information     [facility] Follow up.   Specialty: Home Health Services Why: Physical and Occupational Therapy Contact information: [address] [place] [address] [phone]                Allergies  Allergen Reactions   Codeine Nausea Only   Evista [Raloxifene] Other (See Comments)    Leg cramps   Actonel [Risedronate] Other (See Comments)    GI intolerance   Boniva [Ibandronic Acid] Other (See Comments)    GI intolerance   Fosamax [Alendronate] Other (See Comments)    GI intolerance   Prednisone Other (See Comments)    Aggression Irritability    Consultations: Palliative care   Procedures/Studies: CT ABDOMEN PELVIS W CONTRAST  Result Date: 03/12/2023 CLINICAL DATA:  Acute abdominal pain EXAM: CT ABDOMEN AND PELVIS WITH CONTRAST TECHNIQUE: Multidetector CT imaging of the abdomen and pelvis was performed using the standard protocol following bolus administration of intravenous contrast. RADIATION DOSE REDUCTION: This exam was performed according to the departmental dose-optimization program which includes automated exposure  control, adjustment of the mA and/or kV according to patient size and/or use of iterative reconstruction technique. CONTRAST:  75mL OMNIPAQUE IOHEXOL 350 MG/ML SOLN COMPARISON:  03/10/2023 and 02/13/2023 FINDINGS: Lower chest: Mild dependent atelectasis in the bilateral lower lobes. Hepatobiliary: Liver is within normal limits. Vicarious excretion of contrast in the gallbladder. No intrahepatic or extrahepatic duct dilatation. Pancreas: Within normal limits. Spleen: Within normal limits. Adrenals/Urinary Tract: Adrenal glands are within normal limits. Kidneys are within normal limits.  No hydronephrosis. Bladder is within normal limits. Stomach/Bowel: Stomach is notable for a tiny hiatal hernia. No evidence of bowel obstruction. Normal appendix (series 4/image 59) Left colonic  diverticulosis, without evidence of diverticulitis. Moderate colonic stool burden, suggesting mild constipation. Vascular/Lymphatic: No evidence of abdominal aortic aneurysm. Atherosclerotic calcifications of the abdominal aorta and branch vessels. No suspicious abdominopelvic lymphadenopathy. Reproductive: Uterus is within normal limits. 6.7 x 5.7 cm left ovarian/adnexal cyst, chronic. No right adnexal mass. Other: No abdominopelvic ascites. 3.2 x 2.6 cm hematoma beneath the right rectus sheath (series 4/image 61), grossly unchanged. Additional 15 mm fluid collection in the right obturator fossa (series 4/image 89), unchanged. Additional 12 mm possible fluid collection beneath the left lower pelvic wall anterior to the bladder (series 4/image 74). These are all unchanged from recent CT but improved from 02/13/2023. Musculoskeletal: Degenerative changes of the visualized thoracolumbar spine. IMPRESSION: No interval change from recent CT. Multiple pelvic fluid collections, as described above, reflecting involving hematomas. 6.7 cm left ovarian/adnexal cyst, chronic. Consider annual follow-up as clinically warranted. Electronically Signed   By: Charline Bills M.D.   On: 03/12/2023 23:58   CT ABDOMEN PELVIS W CONTRAST  Result Date: 03/10/2023 CLINICAL DATA:  Lower abdominal pain and nausea. Constipation. Recent pelvic hematoma. Chronic lymphocytic leukemia. * Tracking Code: BO * EXAM: CT ABDOMEN AND PELVIS WITH CONTRAST TECHNIQUE: Multidetector CT imaging of the abdomen and pelvis was performed using the standard protocol following bolus administration of intravenous contrast. RADIATION DOSE REDUCTION: This exam was performed according to the departmental dose-optimization program which includes automated exposure control, adjustment of the mA and/or kV according to patient size and/or use of iterative reconstruction technique. CONTRAST:  60mL OMNIPAQUE IOHEXOL 300 MG/ML  SOLN COMPARISON:  Noncontrast CT on  02/13/2023 FINDINGS: Lower Chest: No acute findings. Hepatobiliary: No suspicious hepatic masses identified. Gallbladder is unremarkable. No evidence of biliary ductal dilatation. Pancreas:  No mass or inflammatory changes. Spleen: Within normal limits in size and appearance. Adrenals/Urinary Tract: No suspicious masses identified. No evidence of ureteral calculi or hydronephrosis. Stomach/Bowel: Stable small hiatal hernia. Large colonic stool burden is now seen, with stool distending the rectum. These findings are suspicious for constipation. Decreased presacral soft tissue stranding noted. No acute inflammatory process or abnormal fluid collections. Vascular/Lymphatic: No pathologically enlarged lymph nodes. No acute vascular findings. Aortic atherosclerotic calcification incidentally noted. Reproductive: Uterus is unremarkable. Simple appearing cyst in left adnexa measures 6.3 x 5.4 cm, without significant change since prior exam. No evidence of free fluid. Small right pelvic sidewall hematoma shows decreased size and attenuation since previous study, currently measuring 2.3 x 1.4 cm, compared to 2.6 x 2.4 cm previously. Other:  None. Musculoskeletal: No suspicious bone lesions identified. Right rectus sheath hematoma in the right suprapubic region shows decreased size and attenuation since prior study, currently measuring 3.4 x 2.6 cm compared to 7.1 by 4.2 cm previously. IMPRESSION: Large colonic stool burden, with stool distending the rectum, significantly increased in size since previous study. These findings are suspicious for constipation. Decreased  size and attenuation of small right rectus sheath and right pelvic sidewall hematomas. Stable small hiatal hernia. Stable 6.3 cm benign-appearing left adnexal cyst. Recommend follow-up US in 6-12 months. Note: This recommendation does not apply to premenarchal patients and to those with increased risk (genetic, family history, elevated tumor markers or other  high-risk factors) of ovarian cancer. Reference: JACR 2020 Feb; 17(2):248-254 Aortic Atherosclerosis (ICD10-I70.0). Electronically Signed   By: Danae Orleans M.D.   On: 03/10/2023 14:23   Overnight EEG with video  Result Date: 02/22/2023 Charlsie Quest, MD     02/22/2023 11:34 AM Patient Name: BEETA DINIS MRN: [id number] Epilepsy Attending: Charlsie Quest Referring Physician/Provider: Charlsie Quest, MD Duration: 02/21/2023 1428 to 02/22/2023 4098  Patient history: 82 y.o. female with a past medical history of CLL, ITP, left parietal ICH in 2021 presenting with aphasia and generalized weakness. EEG to evaluate for seizure.  Level of alertness: awake. asleep  AEDs during EEG study: LCM, VPA  Technical aspects: This EEG study was done with scalp electrodes positioned according to the 10-20 International system of electrode placement. Electrical activity was reviewed with band pass filter of 1-70Hz , sensitivity of 7 uV/mm, display speed of 46mm/sec with a 60Hz  notched filter applied as appropriate. EEG data were recorded continuously and digitally stored.  Video monitoring was available and reviewed as appropriate.  Description:  The posterior dominant rhythm consists of 8-9 Hz activity of moderate voltage (25-35 uV) seen predominantly in posterior head regions, symmetric and reactive to eye opening and eye closing.  Sleep was characterized by sleep spindles (12 to 14 Hz), maximal frontocentral region.  EEG showed continuous generalized and lateralized left hemisphere 5-9Hz  theta-alpha activity admixed with intermittent 2-3hz  delta slowing. Hyperventilation and photic stimulation were not performed.   ABNORMALITY - Continuous slow, generalized and lateralized left hemisphere  IMPRESSION: This study is suggestive of cortical dysfunction arising from left hemisphere likely secondary to underlying structural abnormality. Additionally there is moderate diffuse encephalopathy. No definite seizures were seen.   Charlsie Quest  DG Abd Portable 1V  Result Date: 02/16/2023 CLINICAL DATA:  Feeding tube placement. EXAM: PORTABLE ABDOMEN - 1 VIEW COMPARISON:  02/14/2023 FINDINGS: Feeding tube tip in the region of the gastric pylorus. Normal sized heart. Interval mild linear atelectasis in both lower lung zones. Lower thoracic spine degenerative changes. The included bowel-gas pattern is normal. IMPRESSION: Feeding tube tip in the region of the gastric pylorus. Electronically Signed   By: Beckie Salts M.D.   On: 02/16/2023 16:10   DG Abd Portable 1V  Result Date: 02/14/2023 CLINICAL DATA:  Feeding tube placement. EXAM: PORTABLE ABDOMEN - 1 VIEW COMPARISON:  Abdominopelvic CT yesterday FINDINGS: Tip of the weighted enteric tube is in the right upper quadrant, in the proximal duodenum on yesterday's CT. No dilatation of upper abdominal bowel loops. IMPRESSION: Tip of the weighted enteric tube in the proximal duodenum. Electronically Signed   By: Narda Rutherford M.D.   On: 02/14/2023 14:17   (Echo, Carotid, EGD, Colonoscopy, ERCP)    Subjective: Patient seen and examined.  She denies any complaints.  Denies any nausea vomiting.  She had bowel movement yesterday.  She was wondering about taking laxatives every day and we discussed about bowel management and avoiding constipation.   Discharge Exam: Vitals:   03/16/23 0420 03/16/23 0755  BP: (!) 146/66 (!) 145/39  Pulse: 67 67  Resp: 20 19  Temp: (!) 97.4 F (36.3 C) (!) 97.5 F (36.4 C)  SpO2:  96% 95%   Vitals:   03/15/23 1741 03/15/23 2013 03/16/23 0420 03/16/23 0755  BP: (!) 153/74 138/63 (!) 146/66 (!) 145/39  Pulse:  62 67 67  Resp:  17 20 19   Temp:  98.2 F (36.8 C) (!) 97.4 F (36.3 C) (!) 97.5 F (36.4 C)  TempSrc:  Oral Oral Oral  SpO2:  97% 96% 95%  Weight:      Height:        General: Pt is alert, awake, not in acute distress Frail looking lady not in any distress.  On room air. Cardiovascular: RRR, S1/S2 +, no rubs, no  gallops Respiratory: CTA bilaterally, no wheezing, no rhonchi Abdominal: Soft, NT, ND, bowel sounds + Extremities: no edema, no cyanosis    The results of significant diagnostics from this hospitalization (including imaging, microbiology, ancillary and laboratory) are listed below for reference.     Microbiology: Recent Results (from the past 240 hour(s))  Urine Culture     Status: Abnormal   Collection Time: 03/13/23  7:30 AM   Specimen: Urine, Random  Result Value Ref Range Status   Specimen Description URINE, RANDOM  Final   Special Requests   Final    URINE, CLEAN CATCH Performed at [facility], [address] [place], [place] [address]    Culture >=100,000 COLONIES/mL ENTEROCOCCUS FAECALIS (A)  Final   Report Status 03/15/2023 FINAL  Final   Organism ID, Bacteria ENTEROCOCCUS FAECALIS (A)  Final      Susceptibility   Enterococcus faecalis - MIC*    AMPICILLIN <=2 SENSITIVE Sensitive     NITROFURANTOIN <=16 SENSITIVE Sensitive     VANCOMYCIN 1 SENSITIVE Sensitive     * >=100,000 COLONIES/mL ENTEROCOCCUS FAECALIS     Labs: BNP (last 3 results) Recent Labs    02/08/23 0213 02/09/23 0419 02/27/23 0654  BNP 89.9 20.9 25.4   [facility] Metabolic Panel: Recent Labs  Lab 03/12/23 1904 03/12/23 2123 03/13/23 0724 03/14/23 0311 03/15/23 0224 03/16/23 0122  NA 142  --  142 139 138 139  K 3.0*  --  3.4* 3.8 3.5 3.8  CL 104  --  110 106 108 103  CO2 25  --  21* 22 23 25   GLUCOSE 103*  --  82 101* 84 97  BUN 18  --  12 6* <5* 5*  CREATININE 0.78  --  0.69 0.65 0.60 0.65  CALCIUM 8.7*  --  7.4* 8.1* 7.9* 8.4*  MG  --  2.3  --   --   --   --    Liver Function Tests: Recent Labs  Lab 03/12/23 1904 03/14/23 0311  AST 31 27  ALT 22 19  ALKPHOS 77 69  BILITOT 1.0 1.0  PROT 6.6 5.9*  ALBUMIN 3.7 3.3*   Recent Labs  Lab 03/12/23 1904  LIPASE 30   No results for input(s): "AMMONIA" in the last 168 hours. CBC: Recent Labs  Lab 03/10/23 1134  03/12/23 1904 03/13/23 0724 03/14/23 0311  WBC 8.2 9.4 6.5 9.7  NEUTROABS 5.2  --   --   --   HGB 13.4 13.9 12.8 13.0  HCT 40.4 41.9 39.3 40.1  MCV 94.0 92.3 95.4 92.4  PLT 198 216 179 178   Cardiac Enzymes: No results for input(s): "CKTOTAL", "CKMB", "CKMBINDEX", "TROPONINI" in the last 168 hours. BNP: Invalid input(s): "POCBNP" CBG: Recent Labs  Lab 03/14/23 1205 03/14/23 1627 03/14/23 2101 03/15/23 0717 03/15/23 1221  GLUCAP 94 100* 114* 84 96  D-Dimer No results for input(s): "DDIMER" in the last 72 hours. Hgb A1c No results for input(s): "HGBA1C" in the last 72 hours. Lipid Profile No results for input(s): "CHOL", "HDL", "LDLCALC", "TRIG", "CHOLHDL", "LDLDIRECT" in the last 72 hours. Thyroid function studies No results for input(s): "TSH", "T4TOTAL", "T3FREE", "THYROIDAB" in the last 72 hours.  Invalid input(s): "FREET3" Anemia work up No results for input(s): "VITAMINB12", "FOLATE", "FERRITIN", "TIBC", "IRON", "RETICCTPCT" in the last 72 hours. Urinalysis    Component Value Date/Time   COLORURINE YELLOW 03/13/2023 0730   APPEARANCEUR CLEAR 03/13/2023 0730   LABSPEC 1.035 (H) 03/13/2023 0730   PHURINE 6.0 03/13/2023 0730   GLUCOSEU NEGATIVE 03/13/2023 0730   HGBUR SMALL (A) 03/13/2023 0730   BILIRUBINUR NEGATIVE 03/13/2023 0730   KETONESUR 20 (A) 03/13/2023 0730   PROTEINUR NEGATIVE 03/13/2023 0730   UROBILINOGEN 0.2 05/06/2010 1611   NITRITE NEGATIVE 03/13/2023 0730   LEUKOCYTESUR TRACE (A) 03/13/2023 0730   Sepsis Labs Recent Labs  Lab 03/10/23 1134 03/12/23 1904 03/13/23 0724 03/14/23 0311  WBC 8.2 9.4 6.5 9.7   Microbiology Recent Results (from the past 240 hour(s))  Urine Culture     Status: Abnormal   Collection Time: 03/13/23  7:30 AM   Specimen: Urine, Random  Result Value Ref Range Status   Specimen Description URINE, RANDOM  Final   Special Requests   Final    URINE, CLEAN CATCH Performed at [facility], [address] [place], [place] [address]    Culture >=100,000 COLONIES/mL ENTEROCOCCUS FAECALIS (A)  Final   Report Status 03/15/2023 FINAL  Final   Organism ID, Bacteria ENTEROCOCCUS FAECALIS (A)  Final      Susceptibility   Enterococcus faecalis - MIC*    AMPICILLIN <=2 SENSITIVE Sensitive     NITROFURANTOIN <=16 SENSITIVE Sensitive     VANCOMYCIN 1 SENSITIVE Sensitive     * >=100,000 COLONIES/mL ENTEROCOCCUS FAECALIS     Time coordinating discharge: 32 minutes  SIGNED:   Dorcas Carrow, MD  [facility] 03/16/2023, 11:02 AM

## 2023-03-16 NOTE — Progress Notes (Signed)
Received call from daughter around 1730 regarding her home medication. She stated that the patient was not taking carvedilol or protonix at home, and [facility] does not have those prescription either.  Messaged Dr. Jerral Ralph. Dr. Jerral Ralph sent those prescriptions to [facility].  Daughter notified.  Hinton Dyer, RN

## 2023-03-19 DIAGNOSIS — S3662XD Contusion of rectum, subsequent encounter: Secondary | ICD-10-CM | POA: Diagnosis not present

## 2023-03-19 DIAGNOSIS — N83202 Unspecified ovarian cyst, left side: Secondary | ICD-10-CM | POA: Diagnosis not present

## 2023-03-19 DIAGNOSIS — M47814 Spondylosis without myelopathy or radiculopathy, thoracic region: Secondary | ICD-10-CM | POA: Diagnosis not present

## 2023-03-19 DIAGNOSIS — I10 Essential (primary) hypertension: Secondary | ICD-10-CM | POA: Diagnosis not present

## 2023-03-19 DIAGNOSIS — B952 Enterococcus as the cause of diseases classified elsewhere: Secondary | ICD-10-CM | POA: Diagnosis not present

## 2023-03-19 DIAGNOSIS — D693 Immune thrombocytopenic purpura: Secondary | ICD-10-CM | POA: Diagnosis not present

## 2023-03-19 DIAGNOSIS — I700 Atherosclerosis of aorta: Secondary | ICD-10-CM | POA: Diagnosis not present

## 2023-03-19 DIAGNOSIS — E440 Moderate protein-calorie malnutrition: Secondary | ICD-10-CM | POA: Diagnosis not present

## 2023-03-19 DIAGNOSIS — N390 Urinary tract infection, site not specified: Secondary | ICD-10-CM | POA: Diagnosis not present

## 2023-03-19 DIAGNOSIS — D630 Anemia in neoplastic disease: Secondary | ICD-10-CM | POA: Diagnosis not present

## 2023-03-19 DIAGNOSIS — R443 Hallucinations, unspecified: Secondary | ICD-10-CM | POA: Diagnosis not present

## 2023-03-19 DIAGNOSIS — S3792XD Contusion of unspecified urinary and pelvic organ, subsequent encounter: Secondary | ICD-10-CM | POA: Diagnosis not present

## 2023-03-19 DIAGNOSIS — M810 Age-related osteoporosis without current pathological fracture: Secondary | ICD-10-CM | POA: Diagnosis not present

## 2023-03-19 DIAGNOSIS — F32A Depression, unspecified: Secondary | ICD-10-CM | POA: Diagnosis not present

## 2023-03-19 DIAGNOSIS — I69354 Hemiplegia and hemiparesis following cerebral infarction affecting left non-dominant side: Secondary | ICD-10-CM | POA: Diagnosis not present

## 2023-03-19 DIAGNOSIS — G4089 Other seizures: Secondary | ICD-10-CM | POA: Diagnosis not present

## 2023-03-19 DIAGNOSIS — R4701 Aphasia: Secondary | ICD-10-CM | POA: Diagnosis not present

## 2023-03-19 DIAGNOSIS — C9111 Chronic lymphocytic leukemia of B-cell type in remission: Secondary | ICD-10-CM | POA: Diagnosis not present

## 2023-03-19 DIAGNOSIS — D62 Acute posthemorrhagic anemia: Secondary | ICD-10-CM | POA: Diagnosis not present

## 2023-03-19 DIAGNOSIS — H401110 Primary open-angle glaucoma, right eye, stage unspecified: Secondary | ICD-10-CM | POA: Diagnosis not present

## 2023-03-19 DIAGNOSIS — I69398 Other sequelae of cerebral infarction: Secondary | ICD-10-CM | POA: Diagnosis not present

## 2023-03-19 DIAGNOSIS — F419 Anxiety disorder, unspecified: Secondary | ICD-10-CM | POA: Diagnosis not present

## 2023-03-19 DIAGNOSIS — K449 Diaphragmatic hernia without obstruction or gangrene: Secondary | ICD-10-CM | POA: Diagnosis not present

## 2023-03-19 DIAGNOSIS — M4186 Other forms of scoliosis, lumbar region: Secondary | ICD-10-CM | POA: Diagnosis not present

## 2023-03-21 ENCOUNTER — Telehealth: Payer: Self-pay | Admitting: Hematology

## 2023-03-21 ENCOUNTER — Other Ambulatory Visit: Payer: PPO

## 2023-03-21 ENCOUNTER — Telehealth: Payer: Self-pay | Admitting: *Deleted

## 2023-03-21 NOTE — Telephone Encounter (Signed)
Scheduled the patient's [place] and lab appt from last week to tomorrow. Patient's daughter aware of the new date and time

## 2023-03-21 NOTE — Telephone Encounter (Signed)
Patient's daughter is aware of upcoming appointment times/dates  

## 2023-03-22 ENCOUNTER — Ambulatory Visit (HOSPITAL_COMMUNITY)
Admission: RE | Admit: 2023-03-22 | Discharge: 2023-03-22 | Disposition: A | Discharge status: Home / Self Care | Payer: PPO | Source: Ambulatory Visit | Attending: Gynecologic Oncology | Admitting: Gynecologic Oncology

## 2023-03-22 ENCOUNTER — Inpatient Hospital Stay: Payer: PPO

## 2023-03-22 DIAGNOSIS — N838 Other noninflammatory disorders of ovary, fallopian tube and broad ligament: Secondary | ICD-10-CM | POA: Diagnosis not present

## 2023-03-22 DIAGNOSIS — N9489 Other specified conditions associated with female genital organs and menstrual cycle: Secondary | ICD-10-CM | POA: Insufficient documentation

## 2023-03-22 DIAGNOSIS — Z90710 Acquired absence of both cervix and uterus: Secondary | ICD-10-CM | POA: Diagnosis not present

## 2023-03-23 DIAGNOSIS — I10 Essential (primary) hypertension: Secondary | ICD-10-CM | POA: Diagnosis not present

## 2023-03-23 DIAGNOSIS — D72820 Lymphocytosis (symptomatic): Secondary | ICD-10-CM | POA: Diagnosis not present

## 2023-03-23 DIAGNOSIS — Z9181 History of falling: Secondary | ICD-10-CM | POA: Diagnosis not present

## 2023-03-23 DIAGNOSIS — Z6820 Body mass index (BMI) 20.0-20.9, adult: Secondary | ICD-10-CM | POA: Diagnosis not present

## 2023-03-23 DIAGNOSIS — K5904 Chronic idiopathic constipation: Secondary | ICD-10-CM | POA: Diagnosis not present

## 2023-03-30 ENCOUNTER — Telehealth: Payer: Self-pay | Admitting: Gynecologic Oncology

## 2023-03-30 NOTE — Telephone Encounter (Signed)
Called patient's daughter. Discussed recent ultrasound results. Discussed option of getting follow-up in 1 year versus discontinuing imaging. Patient would prefer not to have further ultrasounds, which I think is very reasonable. They will call me if she develops any new/concerning symptoms moving forward.  Eugene Garnet MD Gynecologic Oncology

## 2023-04-06 NOTE — Progress Notes (Signed)
This encounter was created in error - please disregard.

## 2023-04-19 DIAGNOSIS — H401131 Primary open-angle glaucoma, bilateral, mild stage: Secondary | ICD-10-CM | POA: Diagnosis not present

## 2023-04-25 ENCOUNTER — Ambulatory Visit: Payer: PPO | Admitting: Diagnostic Neuroimaging

## 2023-04-25 ENCOUNTER — Encounter: Payer: Self-pay | Admitting: Diagnostic Neuroimaging

## 2023-04-25 VITALS — BP 160/86 | HR 72 | Ht <= 58 in | Wt 100.0 lb

## 2023-04-25 DIAGNOSIS — G250 Essential tremor: Secondary | ICD-10-CM | POA: Diagnosis not present

## 2023-04-25 DIAGNOSIS — R413 Other amnesia: Secondary | ICD-10-CM

## 2023-04-25 DIAGNOSIS — G40009 Localization-related (focal) (partial) idiopathic epilepsy and epileptic syndromes with seizures of localized onset, not intractable, without status epilepticus: Secondary | ICD-10-CM | POA: Diagnosis not present

## 2023-04-25 MED ORDER — PRIMIDONE 50 MG PO TABS: 25.0000 mg | ORAL_TABLET | Freq: Two times a day (BID) | ORAL | 3 refills | Status: DC

## 2023-04-25 MED ORDER — LACOSAMIDE 200 MG PO TABS: 200.0000 mg | ORAL_TABLET | Freq: Two times a day (BID) | ORAL | 5 refills | Status: DC

## 2023-04-25 NOTE — Progress Notes (Signed)
[facility]  PATIENT: Selena Branch DOB: [birth year]  REFERRING CLINICIAN: Gweneth Dimitri, MD HISTORY FROM: patient and daughter REASON FOR VISIT: new consult   HISTORICAL  CHIEF COMPLAINT:  Chief Complaint  Patient presents with   Follow-up    Rm 7, here with daughter Selena Branch Pt is here for hospital follow up after seizure. Has not had any seizure activity since ER visit.  Pt states she is having shakiness on both hands. Pt's daughter states pt has been having some memory lapses recently.     HISTORY OF PRESENT ILLNESS:   UPDATE (04/25/23, VRP): 82 year old female here for evaluation of seizure.  Patient was admitted in May 2024 for confusion.  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  Symptoms improved.  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  Patient discharged home on Vimpat.  No further seizures or confusion.  Does continue to have some progression of some short-term memory loss at home.  Has had some tremor since 2017 with postural, action and resting tremor.  Tremors have worsened over time especially since May 2024.   UPDATE (02/25/23, Dr. 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     Focal electrographic status epilepticus, resolved ICH, POA Acute encephalopathy, stable - Seizures most likely secondary to underlying ICH - Status epilepticus has resolved.  EEG was also improved.  However patient continues to be encephalopathic.  This is most likely secondary to delirium   Recommendations - Continue Vimpat 200mg  BID - Continue seizure precautions, delirium precautions"   UPDATE (04/07/16, Dr. Frances Branch): "82 year old  right-handed woman with an underlying medical history of hypertension, hyperlipidemia, osteoporosis, glaucoma, and depression, who presents for follow-up consultation of her tremors including hand tremor and lip tremor. The patient is unaccompanied today. I first met her on 01/05/2016, at which time she reported bilateral hand and lip tremors for several months, probably starting around her in December 2016 and she was noted to have a lip tremor by her dentist at the time. 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 I suggested we proceed with a brain MRI. We also talked about pursuing a DaT scan in the future for help with diagnostic clarification. I did not suggest starting her on any new medications at the time."   REVIEW OF SYSTEMS: Full 14 system review of systems performed and negative with exception of: as per HPI.  ALLERGIES: Allergies  Allergen Reactions   Codeine Nausea Only   Evista [Raloxifene] Other (See Comments)    Leg cramps   Actonel [Risedronate] Other (See Comments)    GI intolerance   Boniva [Ibandronic Acid] Other (See Comments)    GI intolerance   Fosamax [Alendronate] Other (See Comments)    GI intolerance   Prednisone Other (See Comments)    Aggression Irritability    HOME MEDICATIONS: Outpatient Medications Prior to Visit  Medication Sig Dispense Refill   amLODipine (NORVASC) 10 MG tablet Take 1 tablet (10 mg total) by mouth at bedtime. 30 tablet 0   atorvastatin (LIPITOR) 40 MG tablet Take 40 mg by mouth daily.      Calcium Carb-Cholecalciferol (CALCIUM + VITAMIN D3 PO) Take 1 tablet by mouth daily.  carvedilol (COREG) 3.125 MG tablet Take 1 tablet (3.125 mg total) by mouth 2 (two) times daily with a meal. 60 tablet 0   dorzolamide-timolol (COSOPT) 22.3-6.8 MG/ML ophthalmic solution Place 1 drop into both eyes 2 (two) times daily.     eltrombopag (PROMACTA) 25 MG tablet Take 1 tablet (25 mg) on Monday Wednesday and Friday .take on  an empty stomach, 1 hour before a meal or 2 hours after. (Patient taking differently: Take 25 mg by mouth as directed. Take 1 tablet (25 mg) on Monday, Wednesday, Thursday and Saturday. Take on an empty stomach, 1 hour before a meal or 2 hours after.) 30 tablet 11   ibandronate (BONIVA) 150 MG tablet Take 150 mg by mouth every 28 (twenty-eight) days.     latanoprost (XALATAN) 0.005 % ophthalmic solution Place 1 drop into both eyes at bedtime.      lidocaine (XYLOCAINE) 2 % jelly Apply 1 Application topically as needed. 30 mL 1   lisinopril (ZESTRIL) 40 MG tablet Take 40 mg by mouth daily.     Multiple Vitamins-Minerals (WOMENS 50+ MULTI VITAMIN/MIN) TABS Take 1 tablet by mouth daily.     ondansetron (ZOFRAN-ODT) 4 MG disintegrating tablet Take 1 tablet (4 mg total) by mouth every 8 (eight) hours as needed for nausea or vomiting. 20 tablet 0   pantoprazole (PROTONIX) 40 MG tablet Take 1 tablet (40 mg total) by mouth 2 (two) times daily. 60 tablet 0   polyethylene glycol powder (GLYCOLAX/MIRALAX) 17 GM/SCOOP powder Take 17 g by mouth daily. 255 g 0   QUEtiapine (SEROQUEL) 25 MG tablet Take 1 tablet (25 mg total) by mouth daily as needed (agitation).     lacosamide (VIMPAT) 200 MG TABS tablet Take 1 tablet (200 mg total) by mouth 2 (two) times daily. 10 tablet 0   glycerin adult 2 g suppository Place 1 suppository rectally as needed for constipation. (Patient not taking: Reported on 04/25/2023) 12 suppository 0   Brinzolamide-Brimonidine (SIMBRINZA) 1-0.2 % SUSP Place 1 drop into both eyes 2 (two) times daily.     LORazepam (ATIVAN) 1 MG tablet Take 1 tablet (1 mg total) by mouth every 8 (eight) hours as needed for seizure. (Patient not taking: Reported on 04/25/2023) 5 tablet 0   No facility-administered medications prior to visit.    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 No pertinent surgical history.  FAMILY HISTORY: Family History  Problem Relation Age of Onset   Stroke Mother    Lung cancer Father    Leukemia Neg Hx    Breast cancer Neg Hx    Ovarian cancer Neg Hx    Colon cancer Neg Hx    Endometrial cancer Neg Hx    Prostate cancer Neg Hx    Pancreatic cancer Neg Hx     SOCIAL HISTORY: Social History   Socioeconomic History   Marital status: Married    Spouse name: Not on file   Number of children: 1   Years of education: HS   Highest education level: Not on file  Occupational History   Occupation: Retired  Tobacco Use   Smoking status: Never   Smokeless tobacco: Never  Vaping Use   Vaping status: Never Used  Substance and Sexual Activity   Alcohol use: No  Alcohol/week: 0.0 standard drinks of alcohol   Drug use: No   Sexual activity: Yes    Birth control/protection: Post-menopausal  Other Topics Concern   Not on file  Social History Narrative   Drinks 2 cups of coffee   Social Determinants of Health   Financial Resource Strain: Not on file  Food Insecurity: No Food Insecurity (03/13/2023)   Hunger Vital Sign    Worried About Running Out of Food in the Last Year: Never true    Ran Out of Food in the Last Year: Never true  Transportation Needs: No Transportation Needs (03/13/2023)   PRAPARE - Administrator, Civil Service (Medical): No    Lack of Transportation (Non-Medical): No  Physical Activity: Not on file  Stress: Not on file  Social Connections: Not on file  Intimate Partner Violence: Not At Risk (03/13/2023)   Humiliation, Afraid, Rape, and Kick questionnaire    Fear of Current or Ex-Partner: No    Emotionally Abused: No    Physically Abused: No    Sexually Abused: No     PHYSICAL EXAM  GENERAL EXAM/CONSTITUTIONAL: Vitals:  Vitals:   04/25/23 0820  BP: (!) 160/86  Pulse: 72  Weight: 100 lb (45.4 kg)  Height: 4'  10" (1.473 m)   Body mass index is 20.9 kg/m. Wt Readings from Last 3 Encounters:  04/25/23 100 lb (45.4 kg)  03/13/23 98 lb 15.8 oz (44.9 kg)  03/10/23 102 lb (46.3 kg)   Patient is in no distress; well developed, nourished and groomed; neck is supple  CARDIOVASCULAR: Examination of carotid arteries is normal; no carotid bruits Regular rate and rhythm, no murmurs Examination of peripheral vascular system by observation and palpation is normal  EYES: Ophthalmoscopic exam of optic discs and posterior segments is normal; no papilledema or hemorrhages No results found.  MUSCULOSKELETAL: Gait, strength, tone, movements noted in Neurologic exam below  NEUROLOGIC: MENTAL STATUS:      No data to display         awake, alert, oriented to person, place and time recent and remote memory intact normal attention and concentration language fluent, comprehension intact, naming intact fund of knowledge appropriate  CRANIAL NERVE:  2nd - no papilledema on fundoscopic exam 2nd, 3rd, 4th, 6th - pupils equal and reactive to light, visual fields full to confrontation, extraocular muscles intact, no nystagmus 5th - facial sensation symmetric 7th - facial strength symmetric 8th - hearing intact 9th - palate elevates symmetrically, uvula midline 11th - shoulder shrug symmetric 12th - tongue protrusion midline  MOTOR:  normal bulk and tone, full strength in the BUE, BLE NEAR CONTINUOUS ORAL DYSKINESIAS / TREMOR MIXED RESTING, POSTURAL AND ACTION TREMOR IN BUE INCREASED COGWHEELING RIGIDITY IN BUE BRADYKINESIA IN BUE  SENSORY:  normal and symmetric to light touch, temperature, vibration  COORDINATION:  finger-nose-finger, fine finger movements normal  REFLEXES:  deep tendon reflexes SLIGHTLY BRISK and symmetric  GAIT/STATION:  narrow based gait     DIAGNOSTIC DATA (LABS, IMAGING, TESTING) - I reviewed patient records, labs, notes, testing and imaging myself where  available.  Lab Results  Component Value Date   WBC 9.7 03/14/2023   HGB 13.0 03/14/2023   HCT 40.1 03/14/2023   MCV 92.4 03/14/2023   PLT 178 03/14/2023      Component Value Date/Time   NA 139 03/16/2023 0122   K 3.8 03/16/2023 0122   CL 103 03/16/2023 0122   CO2 25 03/16/2023 0122   GLUCOSE 97 03/16/2023  0122   BUN 5 (L) 03/16/2023 0122   CREATININE 0.65 03/16/2023 0122   CREATININE 0.81 02/01/2023 0923   CALCIUM 8.4 (L) 03/16/2023 0122   PROT 5.9 (L) 03/14/2023 0311   ALBUMIN 3.3 (L) 03/14/2023 0311   AST 27 03/14/2023 0311   AST 21 02/01/2023 0923   ALT 19 03/14/2023 0311   ALT 9 02/01/2023 0923   ALKPHOS 69 03/14/2023 0311   BILITOT 1.0 03/14/2023 0311   BILITOT 0.5 02/01/2023 0923   GFRNONAA >60 03/16/2023 0122   GFRNONAA >60 02/01/2023 0923   GFRAA >60 06/06/2020 1000   Lab Results  Component Value Date   CHOL 139 02/05/2023   HDL 64 02/05/2023   LDLCALC 69 02/05/2023   TRIG 31 02/05/2023   CHOLHDL 2.2 02/05/2023   Lab Results  Component Value Date   HGBA1C 5.6 02/05/2023   Lab Results  Component Value Date   VITAMINB12 395 02/12/2023   Lab Results  Component Value Date   TSH 0.618 02/05/2023    02/04/23 MRI brain - No acute intracranial process.     ASSESSMENT AND PLAN  82 y.o. year old female here with:   Dx:  1. Localization-related idiopathic epilepsy and epileptic syndromes with seizures of localized onset, not intractable, without status epilepticus (HCC)       PLAN:  SEIZURE DISORDER (due to prior intracerebral hemorrhage in 2021; focal electrographic status epilepticus)  - continue vimpat 200mg  twice a day   - Please maintain precautions. Do not participate in activities where a loss of awareness could harm you or someone else. No swimming alone, no tub bathing, no hot tubs, no driving, no operating motorized vehicles (cars, ATVs, motocycles, etc), lawnmowers, power tools or firearms. No standing at heights, such as rooftops,  ladders or stairs. Avoid hot objects such as stoves, heaters, open fires. Wear a helmet when riding a bicycle, scooter, skateboard, etc. and avoid areas of traffic. Set your water heater to 120 degrees or less.   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    Dispense:  60 tablet    Refill:  5   primidone (MYSOLINE) 50 MG tablet    Sig: Take 0.5-1 tablets (25-50 mg total) by mouth in the morning and at bedtime.    Dispense:  30 tablet    Refill:  3   Return in about 6 months (around 10/26/2023).  I reviewed images, labs, notes, records myself. I summarized findings and reviewed with patient, for this high risk condition (seizure, stroke) requiring high complexity decision making.    Suanne Marker, MD 04/25/2023, 9:43 AM Certified in Neurology, Neurophysiology and Neuroimaging  [facility] [address] [place], [place] [address] [phone]

## 2023-04-25 NOTE — Patient Instructions (Signed)
SEIZURE DISORDER (due to prior ICH; Focal electrographic status epilepticus)  - continue vimpat 200mg  twice a day   - Please maintain precautions. Do not participate in activities where a loss of awareness could harm you or someone else. No swimming alone, no tub bathing, no hot tubs, no driving, no operating motorized vehicles (cars, ATVs, motocycles, etc), lawnmowers, power tools or firearms. No standing at heights, such as rooftops, ladders or stairs. Avoid hot objects such as stoves, heaters, open fires. Wear a helmet when riding a bicycle, scooter, skateboard, etc. and avoid areas of traffic. Set your water heater to 120 degrees or less.    ESSENTIAL TREMOR (since ~ 2009; worsened over time; especially since May 2024; oral + hand tremors) - since ~ 2021; worsened in 2024 - start primidone 25mg  twice a day    MEMORY LOSS (+ parkinsonism, + hospital delirium; concern for dementia with lewy bodies) - safety / supervision issues reviewed - daily physical activity / exercise (at least 15-30 minutes) - eat more plants / vegetables - increase social activities, brain stimulation, games, puzzles, hobbies, crafts, arts, music - aim for at least 7-8 hours sleep per night (or more) - avoid smoking and alcohol - caregiver resources provided - needs help with medications, finances; no driving

## 2023-04-26 DIAGNOSIS — Z6821 Body mass index (BMI) 21.0-21.9, adult: Secondary | ICD-10-CM | POA: Diagnosis not present

## 2023-04-26 DIAGNOSIS — I10 Essential (primary) hypertension: Secondary | ICD-10-CM | POA: Diagnosis not present

## 2023-04-26 DIAGNOSIS — F03918 Unspecified dementia, unspecified severity, with other behavioral disturbance: Secondary | ICD-10-CM | POA: Diagnosis not present

## 2023-04-26 DIAGNOSIS — G250 Essential tremor: Secondary | ICD-10-CM | POA: Diagnosis not present

## 2023-04-26 DIAGNOSIS — R54 Age-related physical debility: Secondary | ICD-10-CM | POA: Diagnosis not present

## 2023-05-03 ENCOUNTER — Telehealth: Payer: Self-pay | Admitting: Diagnostic Neuroimaging

## 2023-05-03 NOTE — Telephone Encounter (Signed)
Pt's daughter called needing to discuss the pt's primidone (MYSOLINE) 50 MG tablet and her reactions to it with the RN or MD. Please advise.

## 2023-05-03 NOTE — Telephone Encounter (Signed)
Returned pt's call. Spoke with [name] ok per DPR. She stated that pt started taking the Primidone on Wednesday 04/26/23 1/2 tab 12.5 mg in the am and 1/2 tab in the pm. Stated pt noticed an immediate change in the shaking of hands and mouth it did help, however on Thursday 04/28/2023 the pt started getting very dizzy where she was losing her balance when walking. She stated that by Sunday 05/01/23 she started giving pt just 12.5 mg of the medication at night only, she stated that the shaking has started again. Pt's daughter states this medication was helping with pt's symptoms, however  the dizziness/loss of balance can cause the pt to fall and hurt herself. Is there another medication that can be given? Please advise.

## 2023-05-09 ENCOUNTER — Inpatient Hospital Stay: Payer: PPO | Attending: Hematology

## 2023-05-09 ENCOUNTER — Inpatient Hospital Stay: Payer: PPO | Admitting: Hematology

## 2023-05-09 DIAGNOSIS — D693 Immune thrombocytopenic purpura: Secondary | ICD-10-CM

## 2023-05-09 DIAGNOSIS — C9110 Chronic lymphocytic leukemia of B-cell type not having achieved remission: Secondary | ICD-10-CM | POA: Diagnosis not present

## 2023-05-09 DIAGNOSIS — Z79899 Other long term (current) drug therapy: Secondary | ICD-10-CM | POA: Diagnosis not present

## 2023-05-09 LAB — CBC WITH DIFFERENTIAL (CANCER CENTER ONLY)
Abs Immature Granulocytes: 0.01 10*3/uL (ref 0.00–0.07)
Basophils Absolute: 0 10*3/uL (ref 0.0–0.1)
Basophils Relative: 0 %
Eosinophils Absolute: 0.3 10*3/uL (ref 0.0–0.5)
Eosinophils Relative: 3 %
HCT: 43.8 % (ref 36.0–46.0)
Hemoglobin: 14.9 g/dL (ref 12.0–15.0)
Immature Granulocytes: 0 %
Lymphocytes Relative: 27 %
Lymphs Abs: 2.4 10*3/uL (ref 0.7–4.0)
MCH: 32.3 pg (ref 26.0–34.0)
MCHC: 34 g/dL (ref 30.0–36.0)
MCV: 95 fL (ref 80.0–100.0)
Monocytes Absolute: 0.9 10*3/uL (ref 0.1–1.0)
Monocytes Relative: 11 %
Neutro Abs: 5.1 10*3/uL (ref 1.7–7.7)
Neutrophils Relative %: 59 %
Platelet Count: 103 10*3/uL — ABNORMAL LOW (ref 150–400)
RBC: 4.61 MIL/uL (ref 3.87–5.11)
RDW: 13.5 % (ref 11.5–15.5)
WBC Count: 8.8 10*3/uL (ref 4.0–10.5)
nRBC: 0 % (ref 0.0–0.2)

## 2023-05-09 LAB — CMP (CANCER CENTER ONLY)
ALT: 14 U/L (ref 0–44)
AST: 22 U/L (ref 15–41)
Albumin: 4.4 g/dL (ref 3.5–5.0)
Alkaline Phosphatase: 60 U/L (ref 38–126)
Anion gap: 7 (ref 5–15)
BUN: 13 mg/dL (ref 8–23)
CO2: 30 mmol/L (ref 22–32)
Calcium: 9.4 mg/dL (ref 8.9–10.3)
Chloride: 103 mmol/L (ref 98–111)
Creatinine: 0.75 mg/dL (ref 0.44–1.00)
GFR, Estimated: >60 mL/min (ref >60)
Glucose, Bld: 100 mg/dL — ABNORMAL HIGH (ref 70–99)
Potassium: 4 mmol/L (ref 3.5–5.1)
Sodium: 140 mmol/L (ref 135–145)
Total Bilirubin: 0.7 mg/dL (ref 0.3–1.2)
Total Protein: 7 g/dL (ref 6.5–8.1)

## 2023-05-09 NOTE — Progress Notes (Signed)
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    I had a phone visit with the patient on 02/02/2023 and she was doing well overall.   Patient is accompanied by her daughter during this visit. Patient has had multiple hospitalizations since our last visit. During this visit, patient notes she has been recovering well after hospitalization. She notes that blood pressure is elevated this morning, but it has been in the normal range at home. Her blood pressure, during this visit, is elevated at 186/69 with low pulse rate of 59  Patient has been started on Primidone for her tremors. Patient was also started on tramadol, but patient discontinued it last week because of increased dizziness.   She complains of dizziness when standing and walking. Her daughter notes that she does not drink water that much. She has been eating well.   She denies any new infection issues, fever, chills, night sweats, unexpected weight loss, abdominal pain, headaches, chest pain, back pain, or leg swelling. No seizures after discharge.   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  SOCIAL HISTORY: Social History   Socioeconomic History   Marital status: Married    Spouse name: Not on file   Number of  children: 1   Years of education: HS   Highest education level: Not on file  Occupational History   Occupation: Retired  Tobacco Use   Smoking status: Never   Smokeless tobacco: Never  Vaping Use   Vaping status: Never Used  Substance and Sexual Activity   Alcohol use: No    Alcohol/week: 0.0 standard drinks of alcohol   Drug use: No   Sexual activity: Yes    Birth control/protection: Post-menopausal  Other Topics Concern   Not on file  Social History Narrative   Drinks 2 cups of coffee   Social Determinants of Health   Financial Resource Strain: Not on file  Food Insecurity: No Food Insecurity (03/13/2023)   Hunger Vital Sign    Worried About Running Out of Food in the Last Year: Never true    Ran Out of Food in the Last Year: Never true  Transportation Needs: No Transportation Needs (03/13/2023)   PRAPARE - Administrator, Civil Service (Medical): No    Lack of Transportation (Non-Medical): No  Physical Activity: Not on file  Stress: Not on file  Social Connections: Not on file  Intimate Partner Violence: Not At Risk (03/13/2023)   Humiliation, Afraid, Rape, and Kick questionnaire    Fear of Current or Ex-Partner: No    Emotionally Abused: No    Physically Abused: No    Sexually Abused: No    FAMILY HISTORY: Family History  Problem Relation Age of Onset   Stroke Mother    Lung  cancer Father    Leukemia Neg Hx    Breast cancer Neg Hx    Ovarian cancer Neg Hx    Colon cancer Neg Hx    Endometrial cancer Neg Hx    Prostate cancer Neg Hx    Pancreatic cancer Neg Hx     ALLERGIES:  is allergic to codeine, evista [raloxifene], actonel [risedronate], boniva [ibandronic acid], fosamax [alendronate], and prednisone.  MEDICATIONS:  Current Outpatient Medications  Medication Sig Dispense Refill   amLODipine (NORVASC) 10 MG tablet Take 1 tablet (10 mg total) by mouth at bedtime. 30 tablet 0   atorvastatin (LIPITOR) 40 MG tablet Take 40 mg by mouth daily.       Calcium Carb-Cholecalciferol (CALCIUM + VITAMIN D3 PO) Take 1 tablet by mouth daily.     carvedilol (COREG) 3.125 MG tablet Take 1 tablet (3.125 mg total) by mouth 2 (two) times daily with a meal. 60 tablet 0   dorzolamide-timolol (COSOPT) 22.3-6.8 MG/ML ophthalmic solution Place 1 drop into both eyes 2 (two) times daily.     eltrombopag (PROMACTA) 25 MG tablet Take 1 tablet (25 mg) on Monday Wednesday and Friday .take on an empty stomach, 1 hour before a meal or 2 hours after. (Patient taking differently: Take 25 mg by mouth as directed. Take 1 tablet (25 mg) on Monday, Wednesday, Thursday and Saturday. Take on an empty stomach, 1 hour before a meal or 2 hours after.) 30 tablet 11   gabapentin (NEURONTIN) 100 MG capsule Take 1 capsule (100 mg total) by mouth 2 (two) times daily. 60 capsule 5   glycerin adult 2 g suppository Place 1 suppository rectally as needed for constipation. (Patient not taking: Reported on 04/25/2023) 12 suppository 0   ibandronate (BONIVA) 150 MG tablet Take 150 mg by mouth every 28 (twenty-eight) days.     lacosamide (VIMPAT) 200 MG TABS tablet Take 1 tablet (200 mg total) by mouth 2 (two) times daily. 60 tablet 5   latanoprost (XALATAN) 0.005 % ophthalmic solution Place 1 drop into both eyes at bedtime.      lidocaine (XYLOCAINE) 2 % jelly Apply 1 Application topically as needed. 30 mL 1   lisinopril (ZESTRIL) 40 MG tablet Take 40 mg by mouth daily.     Multiple Vitamins-Minerals (WOMENS 50+ MULTI VITAMIN/MIN) TABS Take 1 tablet by mouth daily.     ondansetron (ZOFRAN-ODT) 4 MG disintegrating tablet Take 1 tablet (4 mg total) by mouth every 8 (eight) hours as needed for nausea or vomiting. 20 tablet 0   pantoprazole (PROTONIX) 40 MG tablet Take 1 tablet (40 mg total) by mouth 2 (two) times daily. 60 tablet 0   polyethylene glycol powder (GLYCOLAX/MIRALAX) 17 GM/SCOOP powder Take 17 g by mouth daily. 255 g 0   QUEtiapine (SEROQUEL) 25 MG tablet Take 1 tablet (25 mg total)  by mouth daily as needed (agitation).     No current facility-administered medications for this visit.    REVIEW OF SYSTEMS:    10 Point review of Systems was done is negative except as noted above.   PHYSICAL EXAMINATION:  .There were no vitals taken for this visit. GENERAL:alert, in no acute distress and comfortable SKIN: no acute rashes, no significant lesions EYES: conjunctiva are pink and non-injected, sclera anicteric OROPHARYNX: MMM, no exudates, no oropharyngeal erythema or ulceration NECK: supple, no JVD LYMPH:  no palpable lymphadenopathy in the cervical, axillary or inguinal regions LUNGS: clear to auscultation b/l with normal respiratory effort HEART: regular rate & rhythm  ABDOMEN:  normoactive bowel sounds , non tender, not distended. Extremity: no pedal edema PSYCH: alert & oriented x 3 with fluent speech NEURO: no focal motor/sensory deficits  LABORATORY DATA:  I have reviewed the data as listed      Latest Ref Rng & Units 05/09/2023   11:32 AM 03/14/2023    3:11 AM 03/13/2023    7:24 AM  CBC  WBC 4.0 - 10.5 K/uL 8.8  9.7  6.5   Hemoglobin 12.0 - 15.0 g/dL 03.4  74.2  59.5   Hematocrit 36.0 - 46.0 % 43.8  40.1  39.3   Platelets 150 - 400 K/uL 103  178  179     .    Latest Ref Rng & Units 05/09/2023   11:32 AM 03/16/2023    1:22 AM 03/15/2023    2:24 AM  CMP  Glucose 70 - 99 mg/dL 638  97  84   BUN 8 - 23 mg/dL 13  5  <5   Creatinine 0.44 - 1.00 mg/dL 7.56  4.33  2.95   Sodium 135 - 145 mmol/L 140  139  138   Potassium 3.5 - 5.1 mmol/L 4.0  3.8  3.5   Chloride 98 - 111 mmol/L 103  103  108   CO2 22 - 32 mmol/L 30  25  23    Calcium 8.9 - 10.3 mg/dL 9.4  8.4  7.9   Total Protein 6.5 - 8.1 g/dL 7.0     Total Bilirubin 0.3 - 1.2 mg/dL 0.7     Alkaline Phos 38 - 126 U/L 60     AST 15 - 41 U/L 22     ALT 0 - 44 U/L 14      . Lab Results  Component Value Date   LDH 147 02/01/2023    02/27/2019 FISH:    RADIOGRAPHIC STUDIES: I have personally  reviewed the radiological images as listed and agreed with the findings in the report.  No results found.  ASSESSMENT & PLAN:   #1 CLL-currently in remission Previous CLL prognostic FISH panel without detectable mutations. Has been Rai stage 0. 08/31/2019 CT C/A/P ([id number]) ([id number]) revealed "1. Mild multistation lymphadenopathy in the retroperitoneum and bilateral pelvis as detailed, compatible with lymphoma. Normal size spleen. No thoracic adenopathy."  #2 s/p severe thrombocytopenia with platelet counts around 5k likely related to CLL.  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  Currently no anemia.  #4 h/o intraparenchymal bleed- rpt CT head stable (due to uncontrolled HTN and thrombocytopenia)  PLAN:  -Discussed lab results from today, 05/09/2023, with the patient. CBC shows slightly decreased platelet counts at 103 K. CMP is stable -Recommend staying well hydrated, drinking around 2 L of water. -Recommend to follow-up with Neurologist and PCP regarding medication management for blood pressure and dizziness.   -Patient has no notable toxicities from her current dose of Promacta. -Patient has no lab or clinical evidence of CLL progression at this time. -Continue low-dose 25MG  Promacta 3 days a week: Monday, Wednesday, Friday    FOLLOW-UP: Return to clinic with Dr. Candise Che with labs in 3 months  The total time spent in the appointment was 20 minutes* .  All of the patient's questions were answered with apparent satisfaction. The patient knows to call the clinic with any problems, questions or concerns.   Wyvonnia Lora MD MS AAHIVMS [facility] Hematology/Oncology  [facility]  .*Total Encounter Time as defined by the Centers for Medicare and Medicaid Services includes, in addition to the face-to-face time of a patient visit (documented in the note above)  non-face-to-face time: obtaining and reviewing outside history, ordering and reviewing medications, tests or procedures, care coordination (communications with other health care professionals or caregivers) and documentation in the medical record.   I,Param Shah,acting as a Neurosurgeon for Wyvonnia Lora, MD.,have documented all relevant documentation on the behalf of Wyvonnia Lora, MD,as directed by  Wyvonnia Lora, MD while in the presence of Wyvonnia Lora, MD.  .I have reviewed the above documentation for accuracy and completeness, and I agree with the above. Johney Maine MD

## 2023-05-10 MED ORDER — GABAPENTIN 100 MG PO CAPS: 100.0000 mg | ORAL_CAPSULE | Freq: Two times a day (BID) | ORAL | 5 refills | Status: DC

## 2023-05-10 NOTE — Addendum Note (Signed)
Addended by: Joycelyn Schmid R on: 05/10/2023 05:06 PM   Modules accepted: Orders

## 2023-05-10 NOTE — Telephone Encounter (Signed)
Stop primidone.  Start gabapentin 100mg  at bedtime x 1-2 weeks. Then may increase to 100mg  twice a day.  Meds ordered this encounter  Medications   gabapentin (NEURONTIN) 100 MG capsule    Sig: Take 1 capsule (100 mg total) by mouth 2 (two) times daily.    Dispense:  60 capsule    Refill:  5    Suanne Marker, MD 05/10/2023, 5:06 PM Certified in Neurology, Neurophysiology and Neuroimaging  [facility] [address] [place], [place] [address] [phone]

## 2023-05-11 NOTE — Telephone Encounter (Signed)
Called pt's daughter Frederich Cha per [place]. I informed her that Dr Marjory Lies had discontinued her Primidone and had sent a new prescription for pt. He sent in Gabapentin 100 mg, she will start taking 100 mg at bedtime for the first 1-2 weeks, then can increase to 100 mg twice a day. Advised pt's daughter to give [place] an update within a few weeks and let us know if pt is tolerating medication well. Pt's daughter verbalized understanding. Pt had no additional questions at this time but was encouraged to call back if questions arise.

## 2023-05-25 DIAGNOSIS — Z9181 History of falling: Secondary | ICD-10-CM | POA: Diagnosis not present

## 2023-05-25 DIAGNOSIS — Z6821 Body mass index (BMI) 21.0-21.9, adult: Secondary | ICD-10-CM | POA: Diagnosis not present

## 2023-05-25 DIAGNOSIS — E782 Mixed hyperlipidemia: Secondary | ICD-10-CM | POA: Diagnosis not present

## 2023-05-25 DIAGNOSIS — C9110 Chronic lymphocytic leukemia of B-cell type not having achieved remission: Secondary | ICD-10-CM | POA: Diagnosis not present

## 2023-05-25 DIAGNOSIS — E559 Vitamin D deficiency, unspecified: Secondary | ICD-10-CM | POA: Diagnosis not present

## 2023-05-25 DIAGNOSIS — I10 Essential (primary) hypertension: Secondary | ICD-10-CM | POA: Diagnosis not present

## 2023-05-25 DIAGNOSIS — M810 Age-related osteoporosis without current pathological fracture: Secondary | ICD-10-CM | POA: Diagnosis not present

## 2023-05-25 DIAGNOSIS — Z0000 Encounter for general adult medical examination without abnormal findings: Secondary | ICD-10-CM | POA: Diagnosis not present

## 2023-06-30 ENCOUNTER — Telehealth: Payer: Self-pay | Admitting: Pharmacy Technician

## 2023-06-30 NOTE — Telephone Encounter (Signed)
Oral Oncology Patient Advocate Encounter   Received notification that patient is due for re-enrollment for assistance for Promacta through [employer].   Re-enrollment process has been initiated and will be submitted upon completion of necessary documents.  Patient to bring POI and sign form on 08/09/23 at next appointment.  [employer] phone number [phone].   I will continue to follow until final determination.  Jinger Neighbors, CPhT-Adv Oncology Pharmacy Patient Advocate [facility] Direct Number: [phone]  Fax: [phone]

## 2023-07-04 DIAGNOSIS — S0011XA Contusion of right eyelid and periocular area, initial encounter: Secondary | ICD-10-CM | POA: Diagnosis not present

## 2023-07-04 DIAGNOSIS — S0012XA Contusion of left eyelid and periocular area, initial encounter: Secondary | ICD-10-CM | POA: Diagnosis not present

## 2023-07-04 DIAGNOSIS — S0081XA Abrasion of other part of head, initial encounter: Secondary | ICD-10-CM | POA: Diagnosis not present

## 2023-07-06 ENCOUNTER — Encounter (HOSPITAL_COMMUNITY): Payer: Self-pay

## 2023-07-06 ENCOUNTER — Emergency Department (HOSPITAL_COMMUNITY)
Admission: EM | Admit: 2023-07-06 | Discharge: 2023-07-06 | Disposition: A | Discharge status: Home / Self Care | Payer: PPO | Attending: Emergency Medicine | Admitting: Emergency Medicine

## 2023-07-06 ENCOUNTER — Other Ambulatory Visit: Payer: Self-pay

## 2023-07-06 ENCOUNTER — Emergency Department (HOSPITAL_COMMUNITY): Payer: PPO

## 2023-07-06 DIAGNOSIS — I10 Essential (primary) hypertension: Secondary | ICD-10-CM | POA: Insufficient documentation

## 2023-07-06 DIAGNOSIS — I6529 Occlusion and stenosis of unspecified carotid artery: Secondary | ICD-10-CM | POA: Diagnosis not present

## 2023-07-06 DIAGNOSIS — W010XXA Fall on same level from slipping, tripping and stumbling without subsequent striking against object, initial encounter: Secondary | ICD-10-CM | POA: Insufficient documentation

## 2023-07-06 DIAGNOSIS — M5031 Other cervical disc degeneration,  high cervical region: Secondary | ICD-10-CM | POA: Diagnosis not present

## 2023-07-06 DIAGNOSIS — R0902 Hypoxemia: Secondary | ICD-10-CM | POA: Diagnosis not present

## 2023-07-06 DIAGNOSIS — W19XXXA Unspecified fall, initial encounter: Secondary | ICD-10-CM | POA: Diagnosis not present

## 2023-07-06 DIAGNOSIS — R42 Dizziness and giddiness: Secondary | ICD-10-CM | POA: Insufficient documentation

## 2023-07-06 DIAGNOSIS — R609 Edema, unspecified: Secondary | ICD-10-CM | POA: Diagnosis not present

## 2023-07-06 LAB — CBC
HCT: 44 % (ref 36.0–46.0)
Hemoglobin: 15 g/dL (ref 12.0–15.0)
MCH: 32.1 pg (ref 26.0–34.0)
MCHC: 34.1 g/dL (ref 30.0–36.0)
MCV: 94 fL (ref 80.0–100.0)
Platelets: 110 10*3/uL — ABNORMAL LOW (ref 150–400)
RBC: 4.68 MIL/uL (ref 3.87–5.11)
RDW: 12.2 % (ref 11.5–15.5)
WBC: 7.1 10*3/uL (ref 4.0–10.5)
nRBC: 0 % (ref 0.0–0.2)

## 2023-07-06 LAB — BASIC METABOLIC PANEL
Anion gap: 9 (ref 5–15)
BUN: 11 mg/dL (ref 8–23)
CO2: 25 mmol/L (ref 22–32)
Calcium: 9.3 mg/dL (ref 8.9–10.3)
Chloride: 107 mmol/L (ref 98–111)
Creatinine, Ser: 0.7 mg/dL (ref 0.44–1.00)
GFR, Estimated: >60 mL/min (ref >60)
Glucose, Bld: 115 mg/dL — ABNORMAL HIGH (ref 70–99)
Potassium: 3.6 mmol/L (ref 3.5–5.1)
Sodium: 141 mmol/L (ref 135–145)

## 2023-07-06 NOTE — ED Provider Notes (Signed)
[facility]EMERGENCY DEPT [facility] Emergency Department Provider Note MRN:  [id number]  Arrival date & time: 07/06/23     Chief Complaint   Fall History of Present Illness   Selena Branch is a 82 y.o. year-old female with a history of CLL presenting to the ED with chief complaint of fall.  Tripped and fell, facial trauma.  This occurred 2 or 3 days ago.  Has been having intermittent dizziness described as a lightheadedness since the fall.  Had a really bad episode of dizziness this evening after standing from a seated position.  Currently not dizzy.  Patient has prominent facial bruising.  Denies any chest pain or shortness of breath this evening, no recent fever, normal diet, does not drink a lot of water.  Review of Systems  A thorough review of systems was obtained and all systems are negative except as noted in the HPI and PMH.   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 No pertinent surgical history.  Family History  Problem Relation Age of Onset   Stroke Mother    Lung cancer Father    Leukemia Neg Hx    Breast cancer Neg Hx    Ovarian cancer Neg Hx    Colon cancer Neg Hx    Endometrial cancer Neg Hx    Prostate cancer Neg Hx    Pancreatic cancer Neg Hx     Social History   Socioeconomic History   Marital status: Married    Spouse name: Not on file   Number of children: 1   Years of education: HS   Highest education level: Not on file  Occupational History   Occupation: Retired  Tobacco Use   Smoking status: Never   Smokeless tobacco: Never  Vaping Use   Vaping status: Never Used  Substance and Sexual Activity   Alcohol use: No    Alcohol/week: 0.0 standard drinks of alcohol   Drug use: No   Sexual activity: Yes    Birth control/protection: Post-menopausal  Other Topics Concern    Not on file  Social History Narrative   Drinks 2 cups of coffee   Social Determinants of Health   Financial Resource Strain: Not on file  Food Insecurity: No Food Insecurity (03/13/2023)   Hunger Vital Sign    Worried About Running Out of Food in the Last Year: Never true    Ran Out of Food in the Last Year: Never true  Transportation Needs: No Transportation Needs (03/13/2023)   PRAPARE - Administrator, Civil Service (Medical): No    Lack of Transportation (Non-Medical): No  Physical Activity: Not on file  Stress: Not on file  Social Connections: Not on file  Intimate Partner Violence: Not At Risk (03/13/2023)   Humiliation, Afraid, Rape, and Kick questionnaire    Fear of Current or Ex-Partner: No    Emotionally Abused: No    Physically Abused: No    Sexually Abused: No     Physical Exam   Vitals:   07/06/23 0325 07/06/23 0500  BP: (!) 186/70 (!) 159/78  Pulse: 72 71  Resp: 16 17  Temp: 98.2 F (36.8 C)   SpO2: 98% 97%    CONSTITUTIONAL: Well-appearing, NAD NEURO/PSYCH:  Alert and oriented x 3, no focal deficits  EYES:  eyes equal and reactive ENT/NECK:  no LAD, no JVD CARDIO: Regular rate, well-perfused, normal S1 and S2 PULM:  CTAB no wheezing or rhonchi GI/GU:  non-distended, non-tender MSK/SPINE:  No gross deformities, no edema SKIN: Bruising to bilateral periorbital regions and nose   *Additional and/or pertinent findings included in MDM below  Diagnostic and Interventional Summary    EKG Interpretation Date/Time:  Wednesday July 06 2023 06:20:54 EDT Ventricular Rate:  68 PR Interval:  176 QRS Duration:  94 QT Interval:  384 QTC Calculation: 409 R Axis:   67  Text Interpretation: Sinus rhythm Confirmed by Kennis Carina [phone]) on 07/06/2023 6:36:34 AM       Labs Reviewed  CBC - Abnormal; Notable for the following components:      Result Value   Platelets 110 (*)    All other components within normal limits  BASIC METABOLIC PANEL  - Abnormal; Notable for the following components:   Glucose, Bld 115 (*)    All other components within normal limits    CT HEAD WO CONTRAST ( )  Final Result    CT CERVICAL SPINE WO CONTRAST  Final Result    CT MAXILLOFACIAL WO CONTRAST  Final Result      Medications - No data to display   Procedures  /  Critical Care Procedures  ED Course and Medical Decision Making  Initial Impression and Ddx Possible concussion versus facial fracture versus skull fracture versus intracranial bleeding.  Description of dizziness seems consistent with orthostatic hypotension.  She is not describing vertigo, she has a normal neurological exam, highly doubt stroke.  Not having any chest pain or shortness of breath, highly doubt ACS or PE.  Past medical/surgical history that increases complexity of ED encounter: None  Interpretation of Diagnostics I personally reviewed the EKG and my interpretation is as follows: Sinus rhythm without change from prior  Labs reassuring with no significant blood count or electrolyte disturbance.  Patient Reassessment and Ultimate Disposition/Management     CT imaging is without acute injury.  Patient feels well, appropriate for discharge.  Patient management required discussion with the following services or consulting groups:  None  Complexity of Problems Addressed Acute illness or injury that poses threat of life of bodily function  Additional Data Reviewed and Analyzed Further history obtained from: Further history from spouse/family member  Additional Factors Impacting ED Encounter Risk Consideration of hospitalization  Elmer Sow. Pilar Plate, MD [facility] Emergency Medicine [facility] [email or web address]  Final Clinical Impressions(s) / ED Diagnoses     ICD-10-CM   1. Dizziness  R42       ED Discharge Orders     None        Discharge Instructions Discussed with and Provided to Patient:     Discharge Instructions       You were evaluated in the Emergency Department and after careful evaluation, we did not find any emergent condition requiring admission or further testing in the hospital.  Your exam/testing today is overall reassuring.  Symptoms may be due to a concussion and some mild dehydration.  Recommend mental and physical rest, increase in fluid intake as we discussed.  Please return to the Emergency Department if you experience any worsening of your condition.   Thank you for allowing [place] to be a part of your care.       Sabas Sous, MD 07/06/23 [phone]

## 2023-07-06 NOTE — Discharge Instructions (Signed)
You were evaluated in the Emergency Department and after careful evaluation, we did not find any emergent condition requiring admission or further testing in the hospital.  Your exam/testing today is overall reassuring.  Symptoms may be due to a concussion and some mild dehydration.  Recommend mental and physical rest, increase in fluid intake as we discussed.  Please return to the Emergency Department if you experience any worsening of your condition.   Thank you for allowing [place] to be a part of your care.

## 2023-07-06 NOTE — ED Notes (Signed)
C-collar removed by MD

## 2023-07-26 ENCOUNTER — Other Ambulatory Visit: Payer: Self-pay | Admitting: Diagnostic Neuroimaging

## 2023-07-26 NOTE — Telephone Encounter (Signed)
At 2:16 pt's daughter left a vm asking to be called to discuss a request that pt has to change medication, please call daughter to discuss.  Phone rep did call daughter(once confirmed she is on DPR) to find out name of medication pt is wanting a change to, phone rep left a brief vm for daughter asking her to call back in response to her earlier message.

## 2023-07-26 NOTE — Telephone Encounter (Signed)
Noted  

## 2023-07-27 NOTE — Telephone Encounter (Signed)
Called the daughter back. It appears that the pt has been on the gabapentin 100 mg BID and has not noticed improvement. They did feel the primidone helped best and previously was on 50 mg BID. She would like to dc gabapentin and go back to the primidone.  They had some dizziness associated at that time and later felt to be hydration related and not necessarily the medication. Advised I didn't think this would be an issue but would confirm with MD and forward the updated script to [facility] on [address]

## 2023-07-27 NOTE — Telephone Encounter (Signed)
At 4:44 Pt's daughter left a vm  asking for a call to discuss the current medication that pt is on for the diagnosis of Parkinson's.  Daughter states the current medication is not as effective as the initial medication. She would like a call to discuss pt going back to the initial medication she was put on by Dr Marjory Lies.  Pt's daughter is not with pt and does not recall the name of either of the medications.

## 2023-07-28 MED ORDER — PRIMIDONE 50 MG PO TABS: 50.0000 mg | ORAL_TABLET | Freq: Two times a day (BID) | ORAL | 5 refills | Status: DC

## 2023-07-28 NOTE — Telephone Encounter (Signed)
Discussed with Dr Marjory Lies and he is agreeable to the patient returning back to previous medication and states can restart the primidone and stop the gabapentin. I will send the updated script for the pt

## 2023-08-08 ENCOUNTER — Other Ambulatory Visit: Payer: Self-pay

## 2023-08-08 DIAGNOSIS — C9110 Chronic lymphocytic leukemia of B-cell type not having achieved remission: Secondary | ICD-10-CM

## 2023-08-09 ENCOUNTER — Inpatient Hospital Stay (HOSPITAL_BASED_OUTPATIENT_CLINIC_OR_DEPARTMENT_OTHER): Payer: PPO | Admitting: Hematology

## 2023-08-09 ENCOUNTER — Inpatient Hospital Stay: Payer: PPO | Attending: Hematology

## 2023-08-09 ENCOUNTER — Other Ambulatory Visit: Payer: Self-pay

## 2023-08-09 VITALS — BP 179/66 | HR 61 | Temp 97.9°F | Resp 18 | Wt 100.1 lb

## 2023-08-09 DIAGNOSIS — Z801 Family history of malignant neoplasm of trachea, bronchus and lung: Secondary | ICD-10-CM | POA: Diagnosis not present

## 2023-08-09 DIAGNOSIS — D693 Immune thrombocytopenic purpura: Secondary | ICD-10-CM | POA: Diagnosis not present

## 2023-08-09 DIAGNOSIS — R42 Dizziness and giddiness: Secondary | ICD-10-CM | POA: Diagnosis not present

## 2023-08-09 DIAGNOSIS — Z79899 Other long term (current) drug therapy: Secondary | ICD-10-CM | POA: Diagnosis not present

## 2023-08-09 DIAGNOSIS — C9110 Chronic lymphocytic leukemia of B-cell type not having achieved remission: Secondary | ICD-10-CM

## 2023-08-09 LAB — CMP (CANCER CENTER ONLY)
ALT: 13 U/L (ref 0–44)
AST: 23 U/L (ref 15–41)
Albumin: 4.4 g/dL (ref 3.5–5.0)
Alkaline Phosphatase: 55 U/L (ref 38–126)
Anion gap: 4 — ABNORMAL LOW (ref 5–15)
BUN: 12 mg/dL (ref 8–23)
CO2: 30 mmol/L (ref 22–32)
Calcium: 9.3 mg/dL (ref 8.9–10.3)
Chloride: 106 mmol/L (ref 98–111)
Creatinine: 0.85 mg/dL (ref 0.44–1.00)
GFR, Estimated: >60 mL/min (ref >60)
Glucose, Bld: 107 mg/dL — ABNORMAL HIGH (ref 70–99)
Potassium: 5.1 mmol/L (ref 3.5–5.1)
Sodium: 140 mmol/L (ref 135–145)
Total Bilirubin: 0.5 mg/dL (ref <1.2)
Total Protein: 7.1 g/dL (ref 6.5–8.1)

## 2023-08-09 LAB — CBC WITH DIFFERENTIAL (CANCER CENTER ONLY)
Abs Immature Granulocytes: 0.01 10*3/uL (ref 0.00–0.07)
Basophils Absolute: 0 10*3/uL (ref 0.0–0.1)
Basophils Relative: 0 %
Eosinophils Absolute: 0.1 10*3/uL (ref 0.0–0.5)
Eosinophils Relative: 2 %
HCT: 44 % (ref 36.0–46.0)
Hemoglobin: 14.8 g/dL (ref 12.0–15.0)
Immature Granulocytes: 0 %
Lymphocytes Relative: 38 %
Lymphs Abs: 2.6 10*3/uL (ref 0.7–4.0)
MCH: 32 pg (ref 26.0–34.0)
MCHC: 33.6 g/dL (ref 30.0–36.0)
MCV: 95 fL (ref 80.0–100.0)
Monocytes Absolute: 0.6 10*3/uL (ref 0.1–1.0)
Monocytes Relative: 8 %
Neutro Abs: 3.5 10*3/uL (ref 1.7–7.7)
Neutrophils Relative %: 52 %
Platelet Count: 121 10*3/uL — ABNORMAL LOW (ref 150–400)
RBC: 4.63 MIL/uL (ref 3.87–5.11)
RDW: 12.7 % (ref 11.5–15.5)
WBC Count: 6.9 10*3/uL (ref 4.0–10.5)
nRBC: 0 % (ref 0.0–0.2)

## 2023-08-09 LAB — LACTATE DEHYDROGENASE: LDH: 174 U/L (ref 98–192)

## 2023-08-09 NOTE — Telephone Encounter (Signed)
Oral Oncology Patient Advocate Encounter   Submitted application for assistance for Promacta to [employer].   Application submitted via e-fax to [phone]   [employer] phone number [phone].   I will continue to check the status until final determination.   Jinger Neighbors, CPhT-Adv Oncology Pharmacy Patient Advocate [facility] Direct Number: [phone]  Fax: [phone]

## 2023-08-09 NOTE — Progress Notes (Signed)
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    Patient was last seen by me on 05/09/2023 and she complained of dizziness when standing and walking due to dehydration.   Patient is accompanied by her daughter during this visit. Patient notes she recently had a mechanical fall where she hit her lips and her head. She was hospitalized on 07/06/2023 with dizziness due to her fall. She had CT scans which did not show any abnormalities. Patient had a concussion.   Patient notes she has been doing well overall since she got discharged. She reports occasional dizziness when she stands up too quickly. Patient's daughter notes that she is trying to drink more water. Denies drinking 64 oz of water.   She denies any new infection issues, fever, chills, night sweats, unexpected weight loss, chest pain, back pain, or leg swelling.   Patient's daughter notes that the patient's blood pressure stays elevated at home. Her blood pressure during this visit is elevated at 179/66 with pulse rate of 61. This is due to dehydration.   Patient has been taking her Promacta as prescribed and she has been tolerating it well without any new or severe toxicities.   Patient currently lives with her husband who has Alzheimer's.   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  SOCIAL HISTORY: Social History   Socioeconomic History   Marital status: Married    Spouse name: Not on file   Number of children: 1   Years of education: HS   Highest education level: Not on file  Occupational History   Occupation: Retired  Tobacco Use   Smoking status: Never   Smokeless tobacco: Never  Vaping Use   Vaping status: Never Used  Substance and Sexual Activity   Alcohol use: No    Alcohol/week: 0.0 standard drinks of alcohol   Drug use: No   Sexual activity: Yes    Birth control/protection: Post-menopausal  Other Topics Concern   Not on file  Social History Narrative   Drinks 2 cups of coffee   Social Determinants of Health   Financial Resource Strain: Not on file  Food Insecurity: No Food Insecurity (03/13/2023)   Hunger Vital Sign    Worried About Running Out of Food in the Last Year: Never true    Ran Out of Food in the Last Year: Never true  Transportation Needs: No Transportation Needs (03/13/2023)   PRAPARE - Administrator, Civil Service (Medical): No    Lack of Transportation (Non-Medical): No  Physical Activity: Not on file  Stress: Not on file  Social Connections: Not on file  Intimate Partner Violence: Not At Risk (03/13/2023)   Humiliation, Afraid, Rape, and Kick questionnaire    Fear of Current or Ex-Partner: No  Emotionally Abused: No    Physically Abused: No    Sexually Abused: No    FAMILY HISTORY: Family History  Problem Relation Age of Onset   Stroke Mother    Lung cancer Father    Leukemia Neg Hx    Breast cancer Neg Hx    Ovarian cancer Neg Hx    Colon cancer Neg Hx    Endometrial cancer Neg Hx    Prostate cancer Neg Hx    Pancreatic cancer Neg Hx     ALLERGIES:  is allergic to codeine, evista [raloxifene], actonel [risedronate], boniva [ibandronic acid], fosamax [alendronate], and prednisone.  MEDICATIONS:  Current Outpatient  Medications  Medication Sig Dispense Refill   amLODipine (NORVASC) 10 MG tablet Take 1 tablet (10 mg total) by mouth at bedtime. 30 tablet 0   atorvastatin (LIPITOR) 40 MG tablet Take 40 mg by mouth daily.      Calcium Carb-Cholecalciferol (CALCIUM + VITAMIN D3 PO) Take 1 tablet by mouth daily.     carvedilol (COREG) 3.125 MG tablet Take 1 tablet (3.125 mg total) by mouth 2 (two) times daily with a meal. 60 tablet 0   dorzolamide-timolol (COSOPT) 22.3-6.8 MG/ML ophthalmic solution Place 1 drop into both eyes 2 (two) times daily.     eltrombopag (PROMACTA) 25 MG tablet Take 1 tablet (25 mg) on Monday Wednesday and Friday .take on an empty stomach, 1 hour before a meal or 2 hours after. (Patient taking differently: Take 25 mg by mouth as directed. Take 1 tablet (25 mg) on Monday, Wednesday, Thursday and Saturday. Take on an empty stomach, 1 hour before a meal or 2 hours after.) 30 tablet 11   glycerin adult 2 g suppository Place 1 suppository rectally as needed for constipation. (Patient not taking: Reported on 04/25/2023) 12 suppository 0   ibandronate (BONIVA) 150 MG tablet Take 150 mg by mouth every 28 (twenty-eight) days.     lacosamide (VIMPAT) 200 MG TABS tablet Take 1 tablet (200 mg total) by mouth 2 (two) times daily. 60 tablet 5   latanoprost (XALATAN) 0.005 % ophthalmic solution Place 1 drop into both eyes at bedtime.      lidocaine (XYLOCAINE) 2 % jelly Apply 1 Application topically as needed. 30 mL 1   lisinopril (ZESTRIL) 40 MG tablet Take 40 mg by mouth daily.     Multiple Vitamins-Minerals (WOMENS 50+ MULTI VITAMIN/MIN) TABS Take 1 tablet by mouth daily.     ondansetron (ZOFRAN-ODT) 4 MG disintegrating tablet Take 1 tablet (4 mg total) by mouth every 8 (eight) hours as needed for nausea or vomiting. 20 tablet 0   pantoprazole (PROTONIX) 40 MG tablet Take 1 tablet (40 mg total) by mouth 2 (two) times daily. 60 tablet 0   polyethylene glycol powder (GLYCOLAX/MIRALAX) 17 GM/SCOOP powder  Take 17 g by mouth daily. 255 g 0   primidone (MYSOLINE) 50 MG tablet Take 1 tablet (50 mg total) by mouth 2 (two) times daily. 60 tablet 5   QUEtiapine (SEROQUEL) 25 MG tablet Take 1 tablet (25 mg total) by mouth daily as needed (agitation).     No current facility-administered medications for this visit.    REVIEW OF SYSTEMS:    10 Point review of Systems was done is negative except as noted above.   PHYSICAL EXAMINATION:  .BP (!) 179/66   Pulse 61   Temp 97.9 F (36.6 C)   Resp 18   Wt 100 lb 1.6 oz (45.4 kg)   SpO2 99%  BMI 20.92 kg/m  GENERAL:alert, in no acute distress and comfortable SKIN: no acute rashes, no significant lesions EYES: conjunctiva are pink and non-injected, sclera anicteric OROPHARYNX: MMM, no exudates, no oropharyngeal erythema or ulceration NECK: supple, no JVD LYMPH:  no palpable lymphadenopathy in the cervical, axillary or inguinal regions LUNGS: clear to auscultation b/l with normal respiratory effort HEART: regular rate & rhythm ABDOMEN:  normoactive bowel sounds , non tender, not distended. Extremity: no pedal edema PSYCH: alert & oriented x 3 with fluent speech NEURO: no focal motor/sensory deficits  LABORATORY DATA:  I have reviewed the data as listed      Latest Ref Rng & Units 08/09/2023    1:01 PM 07/06/2023    4:44 AM 05/09/2023   11:32 AM  CBC  WBC 4.0 - 10.5 K/uL 6.9  7.1  8.8   Hemoglobin 12.0 - 15.0 g/dL 40.3  47.4  25.9   Hematocrit 36.0 - 46.0 % 44.0  44.0  43.8   Platelets 150 - 400 K/uL 121  110  103     .    Latest Ref Rng & Units 08/09/2023    1:01 PM 07/06/2023    4:44 AM 05/09/2023   11:32 AM  CMP  Glucose 70 - 99 mg/dL 563  875  643   BUN 8 - 23 mg/dL 12  11  13    Creatinine 0.44 - 1.00 mg/dL 3.29  5.18  8.41   Sodium 135 - 145 mmol/L 140  141  140   Potassium 3.5 - 5.1 mmol/L 5.1  3.6  4.0   Chloride 98 - 111 mmol/L 106  107  103   CO2 22 - 32 mmol/L 30  25  30    Calcium 8.9 - 10.3 mg/dL 9.3  9.3  9.4    Total Protein 6.5 - 8.1 g/dL 7.1   7.0   Total Bilirubin <1.2 mg/dL 0.5   0.7   Alkaline Phos 38 - 126 U/L 55   60   AST 15 - 41 U/L 23   22   ALT 0 - 44 U/L 13   14    . Lab Results  Component Value Date   LDH 174 08/09/2023    02/27/2019 FISH:    RADIOGRAPHIC STUDIES: I have personally reviewed the radiological images as listed and agreed with the findings in the report.  No results found.  ASSESSMENT & PLAN:   #1 CLL-currently in remission Previous CLL prognostic FISH panel without detectable mutations. Has been Rai stage 0. 08/31/2019 CT C/A/P ([id number]) ([id number]) revealed "1. Mild multistation lymphadenopathy in the retroperitoneum and bilateral pelvis as detailed, compatible with lymphoma. Normal size spleen. No thoracic adenopathy."  #2 s/p severe thrombocytopenia with platelet counts around 5k likely related to CLL.  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  Currently no anemia.  #4 h/o intraparenchymal bleed- rpt CT head stable (due to uncontrolled HTN and thrombocytopenia)  PLAN:  -Discussed lab results from today, 08/09/2023, in detail with the patient. CBC shows low but improved platelets from 110 K to 121 K. CMP stable.  -Recommend staying well hydrated, drinking around 2 L of water. -Patient has no notable toxicities from her current dose of Promacta. -Patient has no lab or clinical evidence of CLL progression at this time. -Continue low-dose 25MG  Promacta 3 days a week: Monday, Wednesday, Friday    FOLLOW-UP: Return  to clinic with Dr. Candise Che with labs in 4 months   The total time spent in the appointment was 25 minutes* .  All of the patient's questions were answered with apparent satisfaction. The patient knows to call the clinic with any problems, questions or concerns.   Wyvonnia Lora MD MS AAHIVMS [facility] Hematology/Oncology Physician [facility]  .*Total Encounter Time as defined by the Centers for Medicare and Medicaid Services includes, in addition to the face-to-face time of a patient visit (documented in the note above) non-face-to-face time: obtaining and reviewing outside history, ordering and reviewing medications, tests or procedures, care coordination (communications with other health care professionals or caregivers) and documentation in the medical record.   I,Param Shah,acting as a Neurosurgeon for Wyvonnia Lora, MD.,have documented all relevant documentation on the behalf of Wyvonnia Lora, MD,as directed by  Wyvonnia Lora, MD while in the presence of Wyvonnia Lora, MD.   .I have reviewed the above documentation for accuracy and completeness, and I agree with the above. Johney Maine MD

## 2023-08-15 NOTE — Telephone Encounter (Signed)
Oral Oncology Patient Advocate Encounter  Called to check status of application, representative informed me patient will need to apply for Medicare LIS before approval can be finalized.   I have spoken with the patient's daughter, Arline Asp, and she will assist in completing this process.  The link to the website where this can be completed has been sent to Cindy's email: [email or web address]  Jinger Neighbors, CPhT-Adv Oncology Pharmacy Patient Advocate [facility] Direct Number: [phone]  Fax: [phone]

## 2023-08-24 DIAGNOSIS — D696 Thrombocytopenia, unspecified: Secondary | ICD-10-CM | POA: Diagnosis not present

## 2023-08-24 DIAGNOSIS — Z23 Encounter for immunization: Secondary | ICD-10-CM | POA: Diagnosis not present

## 2023-08-24 DIAGNOSIS — I10 Essential (primary) hypertension: Secondary | ICD-10-CM | POA: Diagnosis not present

## 2023-08-24 DIAGNOSIS — E46 Unspecified protein-calorie malnutrition: Secondary | ICD-10-CM | POA: Diagnosis not present

## 2023-08-24 DIAGNOSIS — G40009 Localization-related (focal) (partial) idiopathic epilepsy and epileptic syndromes with seizures of localized onset, not intractable, without status epilepticus: Secondary | ICD-10-CM | POA: Diagnosis not present

## 2023-08-24 DIAGNOSIS — I700 Atherosclerosis of aorta: Secondary | ICD-10-CM | POA: Diagnosis not present

## 2023-08-24 DIAGNOSIS — Z6821 Body mass index (BMI) 21.0-21.9, adult: Secondary | ICD-10-CM | POA: Diagnosis not present

## 2023-09-19 ENCOUNTER — Other Ambulatory Visit (HOSPITAL_COMMUNITY): Payer: Self-pay

## 2023-10-06 ENCOUNTER — Other Ambulatory Visit (HOSPITAL_COMMUNITY): Payer: Self-pay

## 2023-10-06 NOTE — Telephone Encounter (Signed)
Oral Oncology Patient Advocate Encounter  Called and spoke with Selena Asp to see if she had received the Medicare Extra Help Denial letter. The letter was in the mail from yesterday and she will bring it by the office today for submission to the program.  Selena Branch, CPhT-Adv Oncology Pharmacy Patient Advocate [facility] Direct Number: [phone]  Fax: [phone]

## 2023-10-06 NOTE — Telephone Encounter (Signed)
Oral Oncology Patient Advocate Encounter   Submitted LIS denial letter to [employer].   Letter submitted via e-fax to [phone]   [employer] phone number [phone].   I will continue to check the status until final determination.   Jinger Neighbors, CPhT-Adv Oncology Pharmacy Patient Advocate [facility] Direct Number: [phone]  Fax: [phone]

## 2023-10-07 NOTE — Telephone Encounter (Signed)
Oral Oncology Patient Advocate Encounter   Received notification re-enrollment for assistance for Promacta through [employer] has been approved. Patient may continue to receive their medication at $0 from this program.    [employer] phone number [phone].   Effective dates: 10/07/23 through 09/12/24  I have spoken to the patient.  Jinger Neighbors, CPhT-Adv Oncology Pharmacy Patient Advocate [facility] Direct Number: [phone]  Fax: [phone]

## 2023-10-21 DIAGNOSIS — I10 Essential (primary) hypertension: Secondary | ICD-10-CM | POA: Diagnosis not present

## 2023-10-21 DIAGNOSIS — H401131 Primary open-angle glaucoma, bilateral, mild stage: Secondary | ICD-10-CM | POA: Diagnosis not present

## 2023-10-23 ENCOUNTER — Other Ambulatory Visit: Payer: Self-pay | Admitting: Diagnostic Neuroimaging

## 2023-10-24 NOTE — Telephone Encounter (Signed)
 Last seen on 04/25/23 Follow up scheduled on 11/01/23 Last filled on 08/29/23 #14 tablets (28 day supply) Rx pending to be signed

## 2023-11-01 ENCOUNTER — Encounter: Payer: Self-pay | Admitting: Diagnostic Neuroimaging

## 2023-11-01 ENCOUNTER — Ambulatory Visit: Payer: PPO | Admitting: Diagnostic Neuroimaging

## 2023-11-01 VITALS — BP 155/70 | HR 63 | Ht <= 58 in | Wt 96.6 lb

## 2023-11-01 DIAGNOSIS — G250 Essential tremor: Secondary | ICD-10-CM | POA: Diagnosis not present

## 2023-11-01 DIAGNOSIS — G40009 Localization-related (focal) (partial) idiopathic epilepsy and epileptic syndromes with seizures of localized onset, not intractable, without status epilepticus: Secondary | ICD-10-CM

## 2023-11-01 MED ORDER — PRIMIDONE 50 MG PO TABS: 50.0000 mg | ORAL_TABLET | Freq: Two times a day (BID) | ORAL | 5 refills | Status: DC

## 2023-11-01 MED ORDER — LACOSAMIDE 200 MG PO TABS: 200.0000 mg | ORAL_TABLET | Freq: Two times a day (BID) | ORAL | 5 refills | Status: DC

## 2023-11-01 NOTE — Progress Notes (Signed)
[facility]  PATIENT: Selena Branch DOB: August 13, 1941  REFERRING CLINICIAN: Orpha Bur, MD HISTORY FROM: patient and daughter REASON FOR VISIT: new consult   HISTORICAL  CHIEF COMPLAINT:  Chief Complaint  Patient presents with   Follow-up    Pt in room 7. Daughter in room. Here for seizure follow up. Pt report no recent seizures. On vimpat 200mg .    HISTORY OF PRESENT ILLNESS:   UPDATE (11/01/23, VRP): Since last visit, doing well. Symptoms are stable. No alleviating or aggravating factors. Tolerating meds.  No seizures. Tremors are improved.   UPDATE (04/25/23, VRP): 83 year old female here for evaluation of seizure.  Patient was admitted in May 2024 for confusion.  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  Symptoms improved.  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  Patient discharged home on Vimpat.  No further seizures or confusion.  Does continue to have some progression of some short-term memory loss at home.  Has had some tremor since 2017 with postural, action and resting tremor.  Tremors have worsened over time especially since May 2024.   UPDATE (02/25/23, Dr. 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     Focal electrographic status epilepticus, resolved ICH, POA Acute encephalopathy, stable - Seizures most likely secondary to underlying ICH - Status epilepticus has resolved.  EEG was also improved.  However patient continues to be encephalopathic.  This is most likely secondary to delirium   Recommendations - Continue Vimpat 200mg  BID - Continue seizure precautions, delirium precautions"   UPDATE  (04/07/16, Dr. Frances Furbish): "83 year old right-handed woman with an underlying medical history of hypertension, hyperlipidemia, osteoporosis, glaucoma, and depression, who presents for follow-up consultation of her tremors including hand tremor and lip tremor. The patient is unaccompanied today. I first met her on 01/05/2016, at which time she reported bilateral hand and lip tremors for several months, probably starting around her in December 2016 and she was noted to have a lip tremor by her dentist at the time. 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 I suggested we proceed with a brain MRI. We also talked about pursuing a DaT scan in the future for help with diagnostic clarification. I did not suggest starting her on any new medications at the time."   REVIEW OF SYSTEMS: Full 14 system review of systems performed and negative with exception of: as per HPI.  ALLERGIES: Allergies  Allergen Reactions   Codeine Nausea Only   Evista [Raloxifene] Other (See Comments)    Leg cramps   Actonel [Risedronate] Other (See Comments)    GI intolerance   Boniva [Ibandronate] Other (See Comments)    GI intolerance   Fosamax [Alendronate] Other (See Comments)    GI intolerance   Prednisone Other (See Comments)    Aggression Irritability    HOME MEDICATIONS: Outpatient Medications Prior to Visit  Medication Sig Dispense Refill   amLODipine (NORVASC) 10 MG tablet Take 1 tablet (10 mg total) by mouth at bedtime. 30 tablet 0   atorvastatin (LIPITOR) 40 MG tablet Take 40 mg by mouth daily.      Brinzolamide-Brimonidine (SIMBRINZA) 1-0.2 % SUSP Apply to eye. 1 drop in both eyes twice per day.  Calcium Carb-Cholecalciferol (CALCIUM + VITAMIN D3 PO) Take 1 tablet by mouth daily.     dorzolamide-timolol (COSOPT) 22.3-6.8 MG/ML ophthalmic solution Place 1 drop into both eyes 2 (two) times daily.     eltrombopag (PROMACTA) 25 MG tablet Take 1 tablet (25 mg) on Monday Wednesday and  Friday .take on an empty stomach, 1 hour before a meal or 2 hours after. (Patient taking differently: Take 25 mg by mouth as directed. Take 1 tablet (25 mg) on Monday, Wednesday, Thursday and Saturday. Take on an empty stomach, 1 hour before a meal or 2 hours after.) 30 tablet 11   glycerin adult 2 g suppository Place 1 suppository rectally as needed for constipation. 12 suppository 0   hydrochlorothiazide (MICROZIDE) 12.5 MG capsule Take 12.5 mg by mouth daily.     ibandronate (BONIVA) 150 MG tablet Take 150 mg by mouth every 28 (twenty-eight) days.     lacosamide (VIMPAT) 200 MG TABS tablet TAKE 1 TABLET BY MOUTH TWICE A DAY 60 tablet 5   latanoprost (XALATAN) 0.005 % ophthalmic solution Place 1 drop into both eyes at bedtime.      lidocaine (XYLOCAINE) 2 % jelly Apply 1 Application topically as needed. 30 mL 1   lisinopril (ZESTRIL) 40 MG tablet Take 40 mg by mouth daily.     Multiple Vitamins-Minerals (WOMENS 50+ MULTI VITAMIN/MIN) TABS Take 1 tablet by mouth daily.     ondansetron (ZOFRAN-ODT) 4 MG disintegrating tablet Take 1 tablet (4 mg total) by mouth every 8 (eight) hours as needed for nausea or vomiting. 20 tablet 0   polyethylene glycol powder (GLYCOLAX/MIRALAX) 17 GM/SCOOP powder Take 17 g by mouth daily. 255 g 0   primidone (MYSOLINE) 50 MG tablet Take 1 tablet (50 mg total) by mouth 2 (two) times daily. 60 tablet 5   QUEtiapine (SEROQUEL) 25 MG tablet Take 1 tablet (25 mg total) by mouth daily as needed (agitation).     carvedilol (COREG) 3.125 MG tablet Take 1 tablet (3.125 mg total) by mouth 2 (two) times daily with a meal. 60 tablet 0   pantoprazole (PROTONIX) 40 MG tablet Take 1 tablet (40 mg total) by mouth 2 (two) times daily. 60 tablet 0   No facility-administered medications prior to visit.    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 No pertinent surgical history.  FAMILY HISTORY: Family History  Problem Relation Age of Onset   Stroke Mother    Lung cancer Father    Leukemia Neg Hx    Breast cancer Neg Hx    Ovarian cancer Neg Hx    Colon cancer Neg Hx    Endometrial cancer Neg Hx    Prostate cancer Neg Hx    Pancreatic cancer Neg Hx     SOCIAL HISTORY: Social History   Socioeconomic History   Marital status: Married    Spouse name: Not on file   Number of children: 1   Years of education: HS   Highest education level: Not on file  Occupational History   Occupation: Retired  Tobacco Use   Smoking status: Never   Smokeless tobacco: Never  Vaping Use   Vaping status: Never Used  Substance and Sexual Activity   Alcohol use: No    Alcohol/week: 0.0 standard drinks  of alcohol   Drug use: No   Sexual activity: Yes    Birth control/protection: Post-menopausal  Other Topics Concern   Not on file  Social History Narrative   Drinks 2 cups of coffee      Pt lives with husband   Social Drivers of Corporate investment banker Strain: Not on file  Food Insecurity: No Food Insecurity (03/13/2023)   Hunger Vital Sign    Worried About Running Out of Food in the Last Year: Never true    Ran Out of Food in the Last Year: Never true  Transportation Needs: No Transportation Needs (03/13/2023)   PRAPARE - Administrator, Civil Service (Medical): No    Lack of Transportation (Non-Medical): No  Physical Activity: Not on file  Stress: Not on file  Social Connections: Not on file  Intimate Partner Violence: Not At Risk (03/13/2023)   Humiliation, Afraid, Rape, and Kick questionnaire    Fear of Current or Ex-Partner: No    Emotionally Abused: No    Physically Abused: No    Sexually Abused: No     PHYSICAL EXAM  GENERAL EXAM/CONSTITUTIONAL: Vitals:  Vitals:   11/01/23 1407  BP: (!) 155/70  Pulse: 63  Weight: 96 lb 9.6 oz (43.8 kg)   Height: 4\' 10"  (1.473 m)   Body mass index is 20.19 kg/m. Wt Readings from Last 3 Encounters:  11/01/23 96 lb 9.6 oz (43.8 kg)  08/09/23 100 lb 1.6 oz (45.4 kg)  07/06/23 96 lb (43.5 kg)   Patient is in no distress; well developed, nourished and groomed; neck is supple  CARDIOVASCULAR: Examination of carotid arteries is normal; no carotid bruits Regular rate and rhythm, no murmurs Examination of peripheral vascular system by observation and palpation is normal  EYES: Ophthalmoscopic exam of optic discs and posterior segments is normal; no papilledema or hemorrhages No results found.  MUSCULOSKELETAL: Gait, strength, tone, movements noted in Neurologic exam below  NEUROLOGIC: MENTAL STATUS:      No data to display         awake, alert, oriented to person, place and time recent and remote memory intact normal attention and concentration language fluent, comprehension intact, naming intact fund of knowledge appropriate  CRANIAL NERVE:  2nd - no papilledema on fundoscopic exam 2nd, 3rd, 4th, 6th - pupils equal and reactive to light, visual fields full to confrontation, extraocular muscles intact, no nystagmus 5th - facial sensation symmetric 7th - facial strength symmetric 8th - hearing intact 9th - palate elevates symmetrically, uvula midline 11th - shoulder shrug symmetric 12th - tongue protrusion midline  MOTOR:  normal bulk and tone, full strength in the BUE, BLE NEAR CONTINUOUS ORAL DYSKINESIAS / TREMOR MIXED RESTING, POSTURAL AND ACTION TREMOR IN BUE INCREASED COGWHEELING RIGIDITY IN BUE BRADYKINESIA IN BUE  SENSORY:  normal and symmetric to light touch, temperature, vibration  COORDINATION:  finger-nose-finger, fine finger movements normal  REFLEXES:  deep tendon reflexes SLIGHTLY BRISK and symmetric  GAIT/STATION:  narrow based gait     DIAGNOSTIC DATA (LABS, IMAGING, TESTING) - I reviewed patient records, labs, notes, testing and imaging  myself where available.  Lab Results  Component Value Date   WBC 6.9 08/09/2023   HGB 14.8 08/09/2023   HCT 44.0 08/09/2023   MCV 95.0 08/09/2023   PLT 121 (L) 08/09/2023      Component Value Date/Time   NA 140 08/09/2023 1301   K 5.1 08/09/2023 1301   CL 106 08/09/2023 1301  CO2 30 08/09/2023 1301   GLUCOSE 107 (H) 08/09/2023 1301   BUN 12 08/09/2023 1301   CREATININE 0.85 08/09/2023 1301   CALCIUM 9.3 08/09/2023 1301   PROT 7.1 08/09/2023 1301   ALBUMIN 4.4 08/09/2023 1301   AST 23 08/09/2023 1301   ALT 13 08/09/2023 1301   ALKPHOS 55 08/09/2023 1301   BILITOT 0.5 08/09/2023 1301   GFRNONAA >60 08/09/2023 1301   GFRAA >60 06/06/2020 1000   Lab Results  Component Value Date   CHOL 139 02/05/2023   HDL 64 02/05/2023   LDLCALC 69 02/05/2023   TRIG 31 02/05/2023   CHOLHDL 2.2 02/05/2023   Lab Results  Component Value Date   HGBA1C 5.6 02/05/2023   Lab Results  Component Value Date   VITAMINB12 395 02/12/2023   Lab Results  Component Value Date   TSH 0.618 02/05/2023    02/04/23 MRI brain - No acute intracranial process.     ASSESSMENT AND PLAN  83 y.o. year old female here with:   Dx:  No diagnosis found.     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 Do not participate in activities where a loss of awareness could harm you or someone else. No swimming alone, no tub bathing, no hot tubs, no driving, no operating motorized vehicles (cars, ATVs, motocycles, etc), lawnmowers, power tools or firearms. No standing at heights, such as rooftops, ladders or stairs. Avoid hot objects such as stoves, heaters, open fires. Wear a helmet when riding a bicycle, scooter, skateboard, etc. and avoid areas of traffic. Set your water heater to 120 degrees or less.   ESSENTIAL TREMOR (since ~ 2009; worsened over time;  especially since May 2024; oral + hand tremors) - since ~ 2021; worsened in 2024 - start primidone 50mg  twice a day   MEMORY LOSS (+ parkinsonism, + hospital delirium; concern for dementia with lewy bodies) - safety / supervision issues reviewed - daily physical activity / exercise (at least 15-30 minutes) - eat more plants / vegetables - increase social activities, brain stimulation, games, puzzles, hobbies, crafts, arts, music - aim for at least 7-8 hours sleep per night (or more) - avoid smoking and alcohol - caregiver resources provided - needs help with medications, finances; no driving  Meds ordered this encounter  Medications   lacosamide (VIMPAT) 200 MG TABS tablet    Sig: Take 1 tablet (200 mg total) by mouth 2 (two) times daily.    Dispense:  60 tablet    Refill:  5   primidone (MYSOLINE) 50 MG tablet    Sig: Take 1 tablet (50 mg total) by mouth 2 (two) times daily.    Dispense:  60 tablet    Refill:  5   Return in about 1 year (around 10/31/2024).     Suanne Marker, MD 11/01/2023, 2:22 PM Certified in Neurology, Neurophysiology and Neuroimaging  [facility] [address] [place], [place] [address] [phone]

## 2023-11-14 DIAGNOSIS — H00022 Hordeolum internum right lower eyelid: Secondary | ICD-10-CM | POA: Diagnosis not present

## 2023-12-06 ENCOUNTER — Other Ambulatory Visit: Payer: Self-pay

## 2023-12-06 DIAGNOSIS — C9110 Chronic lymphocytic leukemia of B-cell type not having achieved remission: Secondary | ICD-10-CM

## 2023-12-06 NOTE — Progress Notes (Signed)
 HEMATOLOGY/ONCOLOGY CLINIC NOTE  Date of Service: 12/07/2023   Patient Care Team: Orpha Bur, MD as PCP - General (Family Medicine) Johney Maine, MD as Consulting Physician (Hematology)  CHIEF COMPLAINTS/PURPOSE OF CONSULTATION:  Follow-up for continued evaluation and management of CLL with chronic ITP  HISTORY OF PRESENTING ILLNESS:  Please see previous note for details on initial presentation  INTERVAL HISTORY:  Selena Branch is a 83 y.o. 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    Patient was last seen by me on 08/08/2024 and reported a mechanical fall with trauma to the lips and head causing concussion and dizziness. At the time of the visit, she complained of occasional dizziness.   Today, she is accompanied by her daughter. Patient reports that she has been doing well overall since her last clinical visit.   Patient has been taking her Promacta regularly 3 days a week as prescribed and she has been tolerating it well without any new or severe toxicities.   Her daughter reports that patient's Primidone was previously switched to a different medication for some time, which caused her to have a couple of falls. She has since been switched back to Primidone to manage her tremors and she denies endorsing any recent falls since being on Primidone.    Patient reports normal eating habits and notes that she generally does not each much.   Patient has lost 5 pounds in four months and currently weighs 95 pounds.   She denies any new lump/bumps, unexplained fever, chills, night sweats, change in breathing, new abdominal pain, or change in bowel habits,/urination.   Patient reports previously endorsing mild leg swelling in the left leg, which has recently improved. She also reports previous finger swelling which has resolved.   Her daughter reports that patient has been restarted on Lisinopril to help manage her  blood pressure, which has not been very well-controlled recently. Her blood pressure in clinic today is 178/65.   Her daughter reports that patient's eye drop medication has been recently changed.  Patient denies any other major medication changes.  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  SOCIAL HISTORY: Social History   Socioeconomic History   Marital status: Married    Spouse name: Not on file   Number of children: 1   Years of education: HS   Highest education level: Not on file  Occupational History   Occupation: Retired  Tobacco Use   Smoking status: Never   Smokeless tobacco: Never  Vaping Use   Vaping status: Never Used  Substance and Sexual Activity   Alcohol use: No    Alcohol/week: 0.0 standard drinks of alcohol   Drug use: No   Sexual activity: Yes    Birth control/protection: Post-menopausal  Other Topics Concern   Not on file  Social History Narrative   Drinks 2 cups of coffee      Pt lives with husband   Social Drivers of Corporate investment banker Strain: Not on file  Food Insecurity: No Food Insecurity (03/13/2023)   Hunger Vital Sign    Worried About Running Out of Food in the Last Year: Never true    Ran Out of Food  in the Last Year: Never true  Transportation Needs: No Transportation Needs (03/13/2023)   PRAPARE - Administrator, Civil Service (Medical): No    Lack of Transportation (Non-Medical): No  Physical Activity: Not on file  Stress: Not on file  Social Connections: Not on file  Intimate Partner Violence: Not At Risk (03/13/2023)   Humiliation, Afraid, Rape, and Kick questionnaire    Fear of Current or Ex-Partner: No    Emotionally Abused: No    Physically Abused: No    Sexually Abused: No    FAMILY HISTORY: Family  History  Problem Relation Age of Onset   Stroke Mother    Lung cancer Father    Leukemia Neg Hx    Breast cancer Neg Hx    Ovarian cancer Neg Hx    Colon cancer Neg Hx    Endometrial cancer Neg Hx    Prostate cancer Neg Hx    Pancreatic cancer Neg Hx     ALLERGIES:  is allergic to codeine, evista [raloxifene], actonel [risedronate], boniva [ibandronate], fosamax [alendronate], and prednisone.  MEDICATIONS:  Current Outpatient Medications  Medication Sig Dispense Refill   amLODipine (NORVASC) 10 MG tablet Take 1 tablet (10 mg total) by mouth at bedtime. 30 tablet 0   atorvastatin (LIPITOR) 40 MG tablet Take 40 mg by mouth daily.      Brinzolamide-Brimonidine (SIMBRINZA) 1-0.2 % SUSP Apply to eye. 1 drop in both eyes twice per day.     Calcium Carb-Cholecalciferol (CALCIUM + VITAMIN D3 PO) Take 1 tablet by mouth daily.     carvedilol (COREG) 3.125 MG tablet Take 1 tablet (3.125 mg total) by mouth 2 (two) times daily with a meal. 60 tablet 0   dorzolamide-timolol (COSOPT) 22.3-6.8 MG/ML ophthalmic solution Place 1 drop into both eyes 2 (two) times daily.     eltrombopag (PROMACTA) 25 MG tablet Take 1 tablet (25 mg) on Monday Wednesday and Friday .take on an empty stomach, 1 hour before a meal or 2 hours after. (Patient taking differently: Take 25 mg by mouth as directed. Take 1 tablet (25 mg) on Monday, Wednesday, Thursday and Saturday. Take on an empty stomach, 1 hour before a meal or 2 hours after.) 30 tablet 11   glycerin adult 2 g suppository Place 1 suppository rectally as needed for constipation. 12 suppository 0   hydrochlorothiazide (MICROZIDE) 12.5 MG capsule Take 12.5 mg by mouth daily.     ibandronate (BONIVA) 150 MG tablet Take 150 mg by mouth every 28 (twenty-eight) days.     lacosamide (VIMPAT) 200 MG TABS tablet Take 1 tablet (200 mg total) by mouth 2 (two) times daily. 60 tablet 5   latanoprost (XALATAN) 0.005 % ophthalmic solution Place 1 drop into both eyes at bedtime.       lidocaine (XYLOCAINE) 2 % jelly Apply 1 Application topically as needed. 30 mL 1   lisinopril (ZESTRIL) 40 MG tablet Take 40 mg by mouth daily.     Multiple Vitamins-Minerals (WOMENS 50+ MULTI VITAMIN/MIN) TABS Take 1 tablet by mouth daily.     ondansetron (ZOFRAN-ODT) 4 MG disintegrating tablet Take 1 tablet (4 mg total) by mouth every 8 (eight) hours as needed for nausea or vomiting. 20 tablet 0   pantoprazole (PROTONIX) 40 MG tablet Take 1 tablet (40 mg total) by mouth 2 (two) times daily. 60 tablet 0   polyethylene glycol powder (GLYCOLAX/MIRALAX) 17 GM/SCOOP powder Take 17 g by mouth daily. 255 g 0   primidone (MYSOLINE) 50  MG tablet Take 1 tablet (50 mg total) by mouth 2 (two) times daily. 60 tablet 5   QUEtiapine (SEROQUEL) 25 MG tablet Take 1 tablet (25 mg total) by mouth daily as needed (agitation).     No current facility-administered medications for this visit.    REVIEW OF SYSTEMS:    10 Point review of Systems was done is negative except as noted above.   PHYSICAL EXAMINATION:  .There were no vitals taken for this visit.   GENERAL:alert, in no acute distress and comfortable SKIN: no acute rashes, no significant lesions EYES: conjunctiva are pink and non-injected, sclera anicteric OROPHARYNX: MMM, no exudates, no oropharyngeal erythema or ulceration NECK: supple, no JVD LYMPH:  no palpable lymphadenopathy in the cervical, axillary or inguinal regions LUNGS: clear to auscultation b/l with normal respiratory effort HEART: regular rate & rhythm ABDOMEN:  normoactive bowel sounds , non tender, not distended. Extremity: no pedal edema PSYCH: alert & oriented x 3 with fluent speech NEURO: no focal motor/sensory deficits   LABORATORY DATA:  I have reviewed the data as listed      Latest Ref Rng & Units 08/09/2023    1:01 PM 07/06/2023    4:44 AM 05/09/2023   11:32 AM  CBC  WBC 4.0 - 10.5 K/uL 6.9  7.1  8.8   Hemoglobin 12.0 - 15.0 g/dL 09.8  11.9  14.7   Hematocrit  36.0 - 46.0 % 44.0  44.0  43.8   Platelets 150 - 400 K/uL 121  110  103     .    Latest Ref Rng & Units 08/09/2023    1:01 PM 07/06/2023    4:44 AM 05/09/2023   11:32 AM  CMP  Glucose 70 - 99 mg/dL 829  562  130   BUN 8 - 23 mg/dL 12  11  13    Creatinine 0.44 - 1.00 mg/dL 8.65  7.84  6.96   Sodium 135 - 145 mmol/L 140  141  140   Potassium 3.5 - 5.1 mmol/L 5.1  3.6  4.0   Chloride 98 - 111 mmol/L 106  107  103   CO2 22 - 32 mmol/L 30  25  30    Calcium 8.9 - 10.3 mg/dL 9.3  9.3  9.4   Total Protein 6.5 - 8.1 g/dL 7.1   7.0   Total Bilirubin <1.2 mg/dL 0.5   0.7   Alkaline Phos 38 - 126 U/L 55   60   AST 15 - 41 U/L 23   22   ALT 0 - 44 U/L 13   14    . Lab Results  Component Value Date   LDH 174 08/09/2023    02/27/2019 FISH:    RADIOGRAPHIC STUDIES: I have personally reviewed the radiological images as listed and agreed with the findings in the report.  No results found.  ASSESSMENT & PLAN:   83 y.o. female with:  #1 CLL-currently in remission Previous CLL prognostic FISH panel without detectable mutations. Has been Rai stage 0. 08/31/2019 CT C/A/P ([id number]) ([id number]) revealed "1. Mild multistation lymphadenopathy in the retroperitoneum and bilateral pelvis as detailed, compatible with lymphoma. Normal size spleen. No thoracic adenopathy."  #2 s/p severe thrombocytopenia with platelet counts around 5k likely related to CLL.  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  Currently no anemia.  #4 h/o intraparenchymal bleed- rpt CT head  stable (due to uncontrolled HTN and thrombocytopenia)  PLAN:  -Discussed lab results on 12/07/2023 in detail with patient. CBC normal, showed WBC of 5.5K, hemoglobin of 14.2, and platelets of 110K. -platelets stable -CMP stable -LDH pending at time of clinical visit -did not feel an enlarged liver or spleen during physical  examination -Patient has no notable toxicities from her current dose of Promacta. -Patient has no lab or clinical evidence of CLL progression at this time. -Continue 25MG  Promacta 3 days a week: Monday, Wednesday, Friday  -there is no obvious medication explanation for her weight loss -patient shall return to clinic in 4 months  FOLLOW-UP: Return to clinic with Dr. Candise Che with labs in 4 months   The total time spent in the appointment was *** minutes* .  All of the patient's questions were answered with apparent satisfaction. The patient knows to call the clinic with any problems, questions or concerns.   Wyvonnia Lora MD MS AAHIVMS [facility] Hematology/Oncology Physician [facility]  .*Total Encounter Time as defined by the Centers for Medicare and Medicaid Services includes, in addition to the face-to-face time of a patient visit (documented in the note above) non-face-to-face time: obtaining and reviewing outside history, ordering and reviewing medications, tests or procedures, care coordination (communications with other health care professionals or caregivers) and documentation in the medical record.    I,Mitra Faeizi,acting as a Neurosurgeon for Wyvonnia Lora, MD.,have documented all relevant documentation on the behalf of Wyvonnia Lora, MD,as directed by  Wyvonnia Lora, MD while in the presence of Wyvonnia Lora, MD.  ***

## 2023-12-07 ENCOUNTER — Inpatient Hospital Stay: Payer: PPO

## 2023-12-07 ENCOUNTER — Inpatient Hospital Stay: Payer: PPO | Attending: Hematology | Admitting: Hematology

## 2023-12-07 ENCOUNTER — Other Ambulatory Visit: Payer: Self-pay

## 2023-12-07 VITALS — BP 178/65 | Ht <= 58 in | Wt 95.5 lb

## 2023-12-07 DIAGNOSIS — Z823 Family history of stroke: Secondary | ICD-10-CM | POA: Diagnosis not present

## 2023-12-07 DIAGNOSIS — D693 Immune thrombocytopenic purpura: Secondary | ICD-10-CM | POA: Insufficient documentation

## 2023-12-07 DIAGNOSIS — R251 Tremor, unspecified: Secondary | ICD-10-CM | POA: Diagnosis not present

## 2023-12-07 DIAGNOSIS — C9110 Chronic lymphocytic leukemia of B-cell type not having achieved remission: Secondary | ICD-10-CM

## 2023-12-07 DIAGNOSIS — Z79899 Other long term (current) drug therapy: Secondary | ICD-10-CM | POA: Insufficient documentation

## 2023-12-07 DIAGNOSIS — I10 Essential (primary) hypertension: Secondary | ICD-10-CM | POA: Diagnosis not present

## 2023-12-07 DIAGNOSIS — Z801 Family history of malignant neoplasm of trachea, bronchus and lung: Secondary | ICD-10-CM | POA: Diagnosis not present

## 2023-12-07 DIAGNOSIS — Z8616 Personal history of COVID-19: Secondary | ICD-10-CM | POA: Diagnosis not present

## 2023-12-07 LAB — CBC WITH DIFFERENTIAL (CANCER CENTER ONLY)
Abs Immature Granulocytes: 0.01 10*3/uL (ref 0.00–0.07)
Basophils Absolute: 0 10*3/uL (ref 0.0–0.1)
Basophils Relative: 0 %
Eosinophils Absolute: 0.1 10*3/uL (ref 0.0–0.5)
Eosinophils Relative: 3 %
HCT: 41.7 % (ref 36.0–46.0)
Hemoglobin: 14.2 g/dL (ref 12.0–15.0)
Immature Granulocytes: 0 %
Lymphocytes Relative: 44 %
Lymphs Abs: 2.4 10*3/uL (ref 0.7–4.0)
MCH: 32.3 pg (ref 26.0–34.0)
MCHC: 34.1 g/dL (ref 30.0–36.0)
MCV: 95 fL (ref 80.0–100.0)
Monocytes Absolute: 0.4 10*3/uL (ref 0.1–1.0)
Monocytes Relative: 8 %
Neutro Abs: 2.5 10*3/uL (ref 1.7–7.7)
Neutrophils Relative %: 45 %
Platelet Count: 110 10*3/uL — ABNORMAL LOW (ref 150–400)
RBC: 4.39 MIL/uL (ref 3.87–5.11)
RDW: 12.4 % (ref 11.5–15.5)
WBC Count: 5.5 10*3/uL (ref 4.0–10.5)
nRBC: 0 % (ref 0.0–0.2)

## 2023-12-07 LAB — CMP (CANCER CENTER ONLY)
ALT: 21 U/L (ref 0–44)
AST: 29 U/L (ref 15–41)
Albumin: 4.6 g/dL (ref 3.5–5.0)
Alkaline Phosphatase: 53 U/L (ref 38–126)
Anion gap: 8 (ref 5–15)
BUN: 18 mg/dL (ref 8–23)
CO2: 32 mmol/L (ref 22–32)
Calcium: 9.5 mg/dL (ref 8.9–10.3)
Chloride: 101 mmol/L (ref 98–111)
Creatinine: 0.86 mg/dL (ref 0.44–1.00)
GFR, Estimated: >60 mL/min (ref >60)
Glucose, Bld: 101 mg/dL — ABNORMAL HIGH (ref 70–99)
Potassium: 4.2 mmol/L (ref 3.5–5.1)
Sodium: 141 mmol/L (ref 135–145)
Total Bilirubin: 0.5 mg/dL (ref 0.0–1.2)
Total Protein: 7.1 g/dL (ref 6.5–8.1)

## 2023-12-07 LAB — LACTATE DEHYDROGENASE: LDH: 172 U/L (ref 98–192)

## 2024-01-22 ENCOUNTER — Other Ambulatory Visit: Payer: Self-pay | Admitting: Diagnostic Neuroimaging

## 2024-01-24 DIAGNOSIS — H4010X1 Unspecified open-angle glaucoma, mild stage: Secondary | ICD-10-CM | POA: Diagnosis not present

## 2024-02-21 DIAGNOSIS — H6122 Impacted cerumen, left ear: Secondary | ICD-10-CM | POA: Diagnosis not present

## 2024-03-12 DIAGNOSIS — H9312 Tinnitus, left ear: Secondary | ICD-10-CM | POA: Diagnosis not present

## 2024-04-06 ENCOUNTER — Ambulatory Visit: Admitting: Hematology

## 2024-04-06 ENCOUNTER — Other Ambulatory Visit

## 2024-04-18 ENCOUNTER — Other Ambulatory Visit

## 2024-04-18 ENCOUNTER — Ambulatory Visit: Admitting: Hematology

## 2024-04-26 ENCOUNTER — Other Ambulatory Visit: Payer: Self-pay

## 2024-04-26 DIAGNOSIS — C9110 Chronic lymphocytic leukemia of B-cell type not having achieved remission: Secondary | ICD-10-CM

## 2024-04-27 ENCOUNTER — Inpatient Hospital Stay: Admitting: Hematology

## 2024-04-27 ENCOUNTER — Inpatient Hospital Stay: Attending: Hematology

## 2024-04-27 VITALS — BP 174/60 | HR 59 | Temp 97.7°F | Resp 18 | Wt 93.1 lb

## 2024-04-27 DIAGNOSIS — C9110 Chronic lymphocytic leukemia of B-cell type not having achieved remission: Secondary | ICD-10-CM | POA: Insufficient documentation

## 2024-04-27 DIAGNOSIS — D693 Immune thrombocytopenic purpura: Secondary | ICD-10-CM | POA: Insufficient documentation

## 2024-04-27 LAB — CBC WITH DIFFERENTIAL (CANCER CENTER ONLY)
Abs Immature Granulocytes: 0.01 K/uL (ref 0.00–0.07)
Basophils Absolute: 0 K/uL (ref 0.0–0.1)
Basophils Relative: 0 %
Eosinophils Absolute: 0.2 K/uL (ref 0.0–0.5)
Eosinophils Relative: 2 %
HCT: 39.1 % (ref 36.0–46.0)
Hemoglobin: 13.4 g/dL (ref 12.0–15.0)
Immature Granulocytes: 0 %
Lymphocytes Relative: 35 %
Lymphs Abs: 2.5 K/uL (ref 0.7–4.0)
MCH: 32.5 pg (ref 26.0–34.0)
MCHC: 34.3 g/dL (ref 30.0–36.0)
MCV: 94.9 fL (ref 80.0–100.0)
Monocytes Absolute: 0.6 K/uL (ref 0.1–1.0)
Monocytes Relative: 8 %
Neutro Abs: 3.9 K/uL (ref 1.7–7.7)
Neutrophils Relative %: 55 %
Platelet Count: 100 K/uL — ABNORMAL LOW (ref 150–400)
RBC: 4.12 MIL/uL (ref 3.87–5.11)
RDW: 12.4 % (ref 11.5–15.5)
WBC Count: 7.2 K/uL (ref 4.0–10.5)
nRBC: 0 % (ref 0.0–0.2)

## 2024-04-27 LAB — CMP (CANCER CENTER ONLY)
ALT: 15 U/L (ref 0–44)
AST: 23 U/L (ref 15–41)
Albumin: 4.3 g/dL (ref 3.5–5.0)
Alkaline Phosphatase: 52 U/L (ref 38–126)
Anion gap: 8 (ref 5–15)
BUN: 19 mg/dL (ref 8–23)
CO2: 29 mmol/L (ref 22–32)
Calcium: 9.3 mg/dL (ref 8.9–10.3)
Chloride: 102 mmol/L (ref 98–111)
Creatinine: 0.97 mg/dL (ref 0.44–1.00)
GFR, Estimated: 58 mL/min — ABNORMAL LOW (ref >60)
Glucose, Bld: 92 mg/dL (ref 70–99)
Potassium: 3.7 mmol/L (ref 3.5–5.1)
Sodium: 139 mmol/L (ref 135–145)
Total Bilirubin: 0.5 mg/dL (ref 0.0–1.2)
Total Protein: 6.5 g/dL (ref 6.5–8.1)

## 2024-04-27 LAB — LACTATE DEHYDROGENASE: LDH: 161 U/L (ref 98–192)

## 2024-04-27 NOTE — Progress Notes (Signed)
 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    Patient notes that she is feeling well since her last clinic visit about 4 months ago.  No additional falls. No new symptoms such as fevers chills night sweats unexpected weight loss.  Notes that she is still taking care of the house and has been eating okay. No new lumps or bumps. No abdominal pain or distention. No abnormal bleeding or bruising. Has been compliant with her Promacta . Blood pressure fluctuates but is overall well-controlled.  Slightly higher in clinic today since she has not taken her a.m. medications for blood pressure control.  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  SOCIAL HISTORY: Social History   Socioeconomic History   Marital status: Married    Spouse name: Not on file   Number of children: 1   Years of education: HS   Highest education level: Not on file  Occupational History   Occupation: Retired  Tobacco Use   Smoking status: Never   Smokeless tobacco: Never  Vaping Use   Vaping status: Never Used  Substance and Sexual Activity   Alcohol use: No    Alcohol/week: 0.0 standard drinks of alcohol   Drug use: No   Sexual activity: Yes    Birth control/protection: Post-menopausal  Other  Topics Concern   Not on file  Social History Narrative   Drinks 2 cups of coffee      Pt lives with husband   Social Drivers of Corporate investment banker Strain: Not on file  Food Insecurity: No Food Insecurity (03/13/2023)   Hunger Vital Sign    Worried About Running Out of Food in the Last Year: Never true    Ran Out of Food in the Last Year: Never true  Transportation Needs: No Transportation Needs (03/13/2023)   PRAPARE - Administrator, Civil Service (Medical): No    Lack of Transportation (Non-Medical): No  Physical Activity: Not on file  Stress: Not on file  Social Connections: Not on file  Intimate Partner Violence: Not At Risk (03/13/2023)   Humiliation, Afraid, Rape, and Kick questionnaire    Fear of Current or Ex-Partner: No    Emotionally Abused: No    Physically Abused: No    Sexually Abused: No    FAMILY HISTORY: Family History  Problem Relation Age of Onset   Stroke Mother    Lung cancer Father    Leukemia Neg Hx    Breast cancer Neg Hx    Ovarian cancer Neg Hx    Colon cancer Neg Hx    Endometrial cancer Neg Hx    Prostate cancer Neg Hx    Pancreatic cancer Neg Hx     ALLERGIES:  is allergic to codeine, evista [raloxifene], actonel [risedronate],  boniva  [ibandronate ], fosamax [alendronate], and prednisone .  MEDICATIONS:  Current Outpatient Medications  Medication Sig Dispense Refill   amLODipine  (NORVASC ) 10 MG tablet Take 1 tablet (10 mg total) by mouth at bedtime. 30 tablet 0   atorvastatin  (LIPITOR) 40 MG tablet Take 40 mg by mouth daily.      Brinzolamide -Brimonidine  (SIMBRINZA ) 1-0.2 % SUSP Apply to eye. 1 drop in both eyes twice per day.     Calcium  Carb-Cholecalciferol  (CALCIUM  + VITAMIN D3 PO) Take 1 tablet by mouth daily.     carvedilol  (COREG ) 3.125 MG tablet Take 1 tablet (3.125 mg total) by mouth 2 (two) times daily with a meal. 60 tablet 0   dorzolamide -timolol  (COSOPT ) 22.3-6.8 MG/ML ophthalmic solution Place 1 drop into  both eyes 2 (two) times daily.     eltrombopag  (PROMACTA ) 25 MG tablet Take 1 tablet (25 mg) on Monday Wednesday and Friday .take on an empty stomach, 1 hour before a meal or 2 hours after. (Patient taking differently: Take 25 mg by mouth as directed. Take 1 tablet (25 mg) on Monday, Wednesday, Thursday and Saturday. Take on an empty stomach, 1 hour before a meal or 2 hours after.) 30 tablet 11   glycerin  adult 2 g suppository Place 1 suppository rectally as needed for constipation. 12 suppository 0   hydrochlorothiazide (MICROZIDE) 12.5 MG capsule Take 12.5 mg by mouth daily.     ibandronate  (BONIVA ) 150 MG tablet Take 150 mg by mouth every 28 (twenty-eight) days.     lacosamide  (VIMPAT ) 200 MG TABS tablet Take 1 tablet (200 mg total) by mouth 2 (two) times daily. 60 tablet 5   latanoprost  (XALATAN ) 0.005 % ophthalmic solution Place 1 drop into both eyes at bedtime.      lidocaine  (XYLOCAINE ) 2 % jelly Apply 1 Application topically as needed. 30 mL 1   lisinopril  (ZESTRIL ) 40 MG tablet Take 40 mg by mouth daily.     Multiple Vitamins-Minerals (WOMENS 50+ MULTI VITAMIN/MIN) TABS Take 1 tablet by mouth daily.     ondansetron  (ZOFRAN -ODT) 4 MG disintegrating tablet Take 1 tablet (4 mg total) by mouth every 8 (eight) hours as needed for nausea or vomiting. 20 tablet 0   pantoprazole  (PROTONIX ) 40 MG tablet Take 1 tablet (40 mg total) by mouth 2 (two) times daily. 60 tablet 0   polyethylene glycol powder (GLYCOLAX /MIRALAX ) 17 GM/SCOOP powder Take 17 g by mouth daily. 255 g 0   primidone  (MYSOLINE ) 50 MG tablet Take 1 tablet (50 mg total) by mouth 2 (two) times daily. 60 tablet 5   QUEtiapine  (SEROQUEL ) 25 MG tablet Take 1 tablet (25 mg total) by mouth daily as needed (agitation).     No current facility-administered medications for this visit.    REVIEW OF SYSTEMS:    .10 Point review of Systems was done is negative except as noted above.  PHYSICAL EXAMINATION:  .BP (!) 174/60 Comment: re-check   Pulse (!) 59   Temp 97.7 F (36.5 C)   Resp 18   Wt 93 lb 1.6 oz (42.2 kg)   SpO2 100%   BMI 19.46 kg/m   GENERAL:alert, in no acute distress and comfortable SKIN: no acute rashes, no significant lesions EYES: conjunctiva are pink and non-injected, sclera anicteric OROPHARYNX: MMM, no exudates, no oropharyngeal erythema or ulceration NECK: supple, no JVD LYMPH:  no palpable lymphadenopathy in the cervical, axillary or inguinal regions LUNGS: clear to auscultation b/l with normal respiratory effort HEART: regular rate & rhythm ABDOMEN:  normoactive bowel sounds ,  non tender, not distended. Extremity: no pedal edema PSYCH: alert & oriented x 3 with fluent speech NEURO: no focal motor/sensory deficits   LABORATORY DATA:  I have reviewed the data as listed      Latest Ref Rng & Units 04/27/2024    8:32 AM 12/07/2023    1:35 PM 08/09/2023    1:01 PM  CBC  WBC 4.0 - 10.5 K/uL 7.2  5.5  6.9   Hemoglobin 12.0 - 15.0 g/dL 86.5  85.7  85.1   Hematocrit 36.0 - 46.0 % 39.1  41.7  44.0   Platelets 150 - 400 K/uL 100  110  121     .    Latest Ref Rng & Units 04/27/2024    8:32 AM 12/07/2023    1:35 PM 08/09/2023    1:01 PM  CMP  Glucose 70 - 99 mg/dL 92  898  892   BUN 8 - 23 mg/dL 19  18  12    Creatinine 0.44 - 1.00 mg/dL 9.02  9.13  9.14   Sodium 135 - 145 mmol/L 139  141  140   Potassium 3.5 - 5.1 mmol/L 3.7  4.2  5.1   Chloride 98 - 111 mmol/L 102  101  106   CO2 22 - 32 mmol/L 29  32  30   Calcium  8.9 - 10.3 mg/dL 9.3  9.5  9.3   Total Protein 6.5 - 8.1 g/dL 6.5  7.1  7.1   Total Bilirubin 0.0 - 1.2 mg/dL 0.5  0.5  0.5   Alkaline Phos 38 - 126 U/L 52  53  55   AST 15 - 41 U/L 23  29  23    ALT 0 - 44 U/L 15  21  13     . Lab Results  Component Value Date   LDH 172 12/07/2023    02/27/2019 FISH:    RADIOGRAPHIC STUDIES: I have personally reviewed the radiological images as listed and agreed with the findings in the report.  No results found.  ASSESSMENT &  PLAN:   83 y.o. female with:  #1 CLL-currently in remission Previous CLL prognostic FISH panel without detectable mutations. Has been Rai stage 0. 08/31/2019 CT C/A/P ([id number]) ([id number]) revealed 1. Mild multistation lymphadenopathy in the retroperitoneum and bilateral pelvis as detailed, compatible with lymphoma. Normal size spleen. No thoracic adenopathy.  #2 s/p severe thrombocytopenia with platelet counts around 5k likely related to CLL.  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  Currently no anemia.  #4 h/o intraparenchymal bleed- rpt CT head stable (due to uncontrolled HTN and thrombocytopenia)  PLAN:  -Discussed lab results on 04/27/2024 CBC is showing stable hemoglobin of 13.4, normal WBC count of 7.2k with no lymphocytosis and stable mild thrombocytopenia with a platelet count of 100k - CMP stable - Patient does not have any clinical symptoms suggestive of CLL progression at this time and no bleeding issues. - No lymphadenopathy or hepatosplenomegaly on examination. - No toxicity with her current low dose of Promacta . - Patient is at her platelet goal of more than 50k with her current dose of Promacta  and we shall continue Promacta  25 mg 3 days a week on Monday Wednesday and Friday. - Encourage patient to maintain good p.o. intake and continue supplementation with a high calorie high-protein shake. - Continue vitamin D replacement - Continue follow-up with PCP for management of  other medical issues. We shall see him back in 4 months  FOLLOW-UP: Return to clinic with Dr. Onesimo with labs in 4 months  The total time spent in the appointment was 20 minutes*.  All of the patient's questions were answered with apparent satisfaction. The patient knows to call the clinic with any problems, questions or concerns.   Emaline Onesimo MD MS AAHIVMS [facility] Hematology/Oncology  Physician [facility]  .*Total Encounter Time as defined by the Centers for Medicare and Medicaid Services includes, in addition to the face-to-face time of a patient visit (documented in the note above) non-face-to-face time: obtaining and reviewing outside history, ordering and reviewing medications, tests or procedures, care coordination (communications with other health care professionals or caregivers) and documentation in the medical record.

## 2024-04-29 ENCOUNTER — Other Ambulatory Visit: Payer: Self-pay | Admitting: Diagnostic Neuroimaging

## 2024-06-01 DIAGNOSIS — D693 Immune thrombocytopenic purpura: Secondary | ICD-10-CM | POA: Diagnosis not present

## 2024-06-01 DIAGNOSIS — F03918 Unspecified dementia, unspecified severity, with other behavioral disturbance: Secondary | ICD-10-CM | POA: Diagnosis not present

## 2024-06-01 DIAGNOSIS — G40009 Localization-related (focal) (partial) idiopathic epilepsy and epileptic syndromes with seizures of localized onset, not intractable, without status epilepticus: Secondary | ICD-10-CM | POA: Diagnosis not present

## 2024-06-01 DIAGNOSIS — C9110 Chronic lymphocytic leukemia of B-cell type not having achieved remission: Secondary | ICD-10-CM | POA: Diagnosis not present

## 2024-06-01 DIAGNOSIS — G250 Essential tremor: Secondary | ICD-10-CM | POA: Diagnosis not present

## 2024-06-01 DIAGNOSIS — Z1331 Encounter for screening for depression: Secondary | ICD-10-CM | POA: Diagnosis not present

## 2024-06-01 DIAGNOSIS — Z0000 Encounter for general adult medical examination without abnormal findings: Secondary | ICD-10-CM | POA: Diagnosis not present

## 2024-06-22 ENCOUNTER — Other Ambulatory Visit: Payer: Self-pay | Admitting: Diagnostic Neuroimaging

## 2024-06-22 MED ORDER — LACOSAMIDE 200 MG PO TABS: 200.0000 mg | ORAL_TABLET | Freq: Two times a day (BID) | ORAL | 5 refills | Status: DC

## 2024-06-22 MED ORDER — LACOSAMIDE 200 MG PO TABS: 200.0000 mg | ORAL_TABLET | Freq: Two times a day (BID) | ORAL | 5 refills | Status: AC

## 2024-06-22 NOTE — Telephone Encounter (Signed)
 Pharmacy called to request Medication refill  lacosamide  (VIMPAT ) 200 MG TABS tablet  PT medication is to be sent to    [facility] [id number], [place] - [address] (Ph: [phone])

## 2024-06-22 NOTE — Addendum Note (Signed)
 Addended by: Chin Wachter-JACKSON, Verble Styron L on: 06/22/2024 11:13 AM   Modules accepted: Orders

## 2024-06-22 NOTE — Telephone Encounter (Signed)
 Last filled by patient on 04/17/24 Last office visit : /18/25 Next office visit : 11/06/24

## 2024-06-27 ENCOUNTER — Encounter (INDEPENDENT_AMBULATORY_CARE_PROVIDER_SITE_OTHER): Payer: Self-pay | Admitting: Physician Assistant

## 2024-06-27 ENCOUNTER — Ambulatory Visit (INDEPENDENT_AMBULATORY_CARE_PROVIDER_SITE_OTHER): Admitting: Physician Assistant

## 2024-06-27 ENCOUNTER — Ambulatory Visit (INDEPENDENT_AMBULATORY_CARE_PROVIDER_SITE_OTHER): Admitting: Audiology

## 2024-06-27 VITALS — BP 198/73 | HR 65 | Temp 97.9°F | Ht <= 58 in | Wt 97.0 lb

## 2024-06-27 DIAGNOSIS — H9312 Tinnitus, left ear: Secondary | ICD-10-CM | POA: Diagnosis not present

## 2024-06-27 DIAGNOSIS — H903 Sensorineural hearing loss, bilateral: Secondary | ICD-10-CM

## 2024-06-27 DIAGNOSIS — H905 Unspecified sensorineural hearing loss: Secondary | ICD-10-CM

## 2024-06-27 NOTE — Progress Notes (Signed)
  [address] [place], [place] [address] [phone]  Audiological Evaluation    Name: Selena Branch     DOB:   1941/06/27      MRN:   [id number]                                                                                     Service Date: 06/27/2024     Accompanied by: husband   Patient comes today after Selena Cohen, PA-C sent a referral for a hearing evaluation due to concerns with tinnitus.   Symptoms Yes Details  Hearing loss  [x]  Known hearing loss fro years  Tinnitus  [x]  Reports left ear buzzing since June 2025 when she had her ear cleaned at her PCP  Ear pain/ infections/pressure  []    Balance problems  []    Noise exposure history  []    Previous ear surgeries  []    Family history of hearing loss  []    Amplification  [x]  Reports has worn hearing aids. No longer working.  Other  [x]  Notes in the system reports concerns with memory loss    Otoscopy: Right ear: Clear external ear canal and notable landmarks visualized on the tympanic membrane. Left ear:  Clear external ear canal and notable landmarks visualized on the tympanic membrane.  Tympanometry: Right ear: Type As- Normal external ear canal volume with normal middle ear pressure and low tympanic membrane compliance. Left ear: Type As- Normal external ear canal volume with normal middle ear pressure and low tympanic membrane compliance.    Pure tone Audiometry: Both ears- Normal to severe sensorineural hearing loss from 125 Hz - 8000 Hz.   Speech Audiometry: Right ear- Speech Reception Threshold (SRT) was obtained at 40 dBHL. Left ear-Speech Reception Threshold (SRT) was obtained at 40 dBHL.   Word Recognition Score Tested using NU-6 (recorded) Right ear: 60% was obtained at a presentation level of 85 dBHL with contralateral masking which is deemed as  poor. Left ear: 60% was obtained at a presentation level of 85 dBHL with contralateral masking which is deemed as  poor.   The hearing  test results were completed under headphones and results are deemed to be of fair to good reliability. Test technique:  conventional    Impression: There is not a significant difference in pure-tone thresholds between ears. There is not a significant difference in the word recognition score in between ears.    Recommendations: Follow up with ENT as scheduled for today. Return for a hearing evaluation if concerns with hearing changes arise or per MD recommendation. Consider various tinnitus strategies, including the use of a sound generator, hearing aids, and/or tinnitus retraining therapy.  Consider a communication needs assessment after medical clearance for hearing aids is obtained. A sheet with some clinics that work with hearing aids in the area was provided to the patient. [name] may call her insurance and ask if she has hearing aid benefits and where can she use them.   Selena Branch Selena Branch, AUD

## 2024-06-27 NOTE — Progress Notes (Signed)
 Dear Dr. Delayne, Here is my assessment for our mutual patient, Selena Branch. Thank you for allowing me the opportunity to care for your patient. Please do not hesitate to contact me should you have any other questions. Sincerely, Chyrl Cohen PA-C  Otolaryngology Clinic Note Referring provider: Dr. Delayne HPI:  Selena Branch is a 83 y.o. female kindly referred by Dr. Delayne   The patient is an 56 YOM seen today for evaluation for tinnitus. She notes tonal tinnitus in the left ear that started after having her left ear irrigated for cerumen impaction. She describes it as a low buzzing sound. No associated pain, no dizziness, no ringing. She denies history of ear infections. She notes some baseline hearing loss but reports that her hearing aids no longer work.    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 No pertinent surgical history.  Family History  Problem Relation Age of Onset   Stroke Mother    Lung cancer Father    Leukemia Neg Hx    Breast cancer Neg Hx    Ovarian cancer Neg Hx    Colon cancer Neg Hx    Endometrial cancer Neg Hx    Prostate cancer Neg Hx    Pancreatic cancer Neg Hx      Social Connections: Not on file      Current Outpatient Medications:    amLODipine  (NORVASC ) 10 MG tablet, Take 1 tablet (10 mg total) by mouth at bedtime., Disp: 30 tablet, Rfl: 0   atorvastatin  (LIPITOR) 40 MG tablet, Take 40 mg by mouth daily. , Disp: , Rfl:    Brinzolamide -Brimonidine  (SIMBRINZA ) 1-0.2 % SUSP, Apply to eye. 1 drop in both eyes twice per day., Disp: , Rfl:    Calcium  Carb-Cholecalciferol  (CALCIUM  + VITAMIN D3 PO), Take 1 tablet by mouth daily., Disp: , Rfl:     carvedilol  (COREG ) 3.125 MG tablet, Take 1 tablet (3.125 mg total) by mouth 2 (two) times daily with a meal., Disp: 60 tablet, Rfl: 0   dorzolamide -timolol  (COSOPT ) 22.3-6.8 MG/ML ophthalmic solution, Place 1 drop into both eyes 2 (two) times daily., Disp: , Rfl:    eltrombopag  (PROMACTA ) 25 MG tablet, Take 1 tablet (25 mg) on Monday Wednesday and Friday .take on an empty stomach, 1 hour before a meal or 2 hours after. (Patient taking differently: Take 25 mg by mouth as directed. Take 1 tablet (25 mg) on Monday, Wednesday, Thursday and Saturday. Take on an empty stomach, 1 hour before a meal or 2 hours after.), Disp: 30 tablet, Rfl: 11   glycerin  adult 2 g suppository, Place 1 suppository rectally as needed for constipation., Disp: 12 suppository, Rfl: 0   hydrochlorothiazide (MICROZIDE) 12.5 MG capsule, Take 12.5 mg by mouth daily., Disp: , Rfl:    ibandronate  (BONIVA ) 150 MG tablet, Take 150 mg by mouth every 28 (twenty-eight) days., Disp: , Rfl:    lacosamide  (VIMPAT ) 200 MG TABS tablet, Take 1 tablet (200 mg total) by mouth 2 (two) times daily., Disp: 60 tablet, Rfl: 5   latanoprost  (XALATAN ) 0.005 % ophthalmic solution, Place 1 drop into both eyes at bedtime. , Disp: , Rfl:    lidocaine  (XYLOCAINE ) 2 %  jelly, Apply 1 Application topically as needed., Disp: 30 mL, Rfl: 1   lisinopril  (ZESTRIL ) 40 MG tablet, Take 40 mg by mouth daily., Disp: , Rfl:    Multiple Vitamins-Minerals (WOMENS 50+ MULTI VITAMIN/MIN) TABS, Take 1 tablet by mouth daily., Disp: , Rfl:    ondansetron  (ZOFRAN -ODT) 4 MG disintegrating tablet, Take 1 tablet (4 mg total) by mouth every 8 (eight) hours as needed for nausea or vomiting., Disp: 20 tablet, Rfl: 0   pantoprazole  (PROTONIX ) 40 MG tablet, Take 1 tablet (40 mg total) by mouth 2 (two) times daily., Disp: 60 tablet, Rfl: 0   polyethylene glycol powder (GLYCOLAX /MIRALAX ) 17 GM/SCOOP powder, Take 17 g by mouth daily., Disp: 255 g, Rfl: 0   primidone  (MYSOLINE ) 50 MG tablet,  TAKE 1 TABLET BY MOUTH 2 TIMES A DAY, Disp: 60 tablet, Rfl: 5   QUEtiapine  (SEROQUEL ) 25 MG tablet, Take 1 tablet (25 mg total) by mouth daily as needed (agitation)., Disp: , Rfl:    Physical Exam:   BP (!) 198/73 (BP Location: Right Arm, Patient Position: Sitting) Comment: Did not take morning PB medication  Pulse 65   Temp 97.9 F (36.6 C) (Oral)   Ht 4' 10 (1.473 m)   Wt 97 lb (44 kg)   SpO2 97%   BMI 20.27 kg/m   Pertinent Findings  CN II-XII intact Bilateral EAC clear and TM intact with well pneumatized middle ear spaces Anterior rhinoscopy: Septum midline; bilateral inferior turbinates with no hypertrophy No lesions of oral cavity/oropharynx; dentition within normal limits No obviously palpable neck masses/lymphadenopathy/thyromegaly No respiratory distress or stridor  Seprately Identifiable Procedures:  None  Impression & Plans:  Selena Branch is a 83 y.o. female with the following   Tinnitus-  Left sided tonal tinnitus, no alarming features today.  Reassuring physical exam.  Symmetric sensorineural hearing loss.  I would recommend following up with audiological services for evaluation of hearing aids.  She was given strict return precautions, she verbalized understanding and agreement to today's plan had no further questions or concerns.   - f/u RN   Thank you for allowing me the opportunity to care for your patient. Please do not hesitate to contact me should you have any other questions.  Sincerely, Chyrl Cohen PA-C [facility] Phone: [phone] Fax: [phone]  06/27/2024, 9:50 AM

## 2024-07-10 ENCOUNTER — Encounter: Payer: Self-pay | Admitting: Audiology

## 2024-07-16 ENCOUNTER — Telehealth: Payer: Self-pay

## 2024-07-16 NOTE — Telephone Encounter (Signed)
 Oral Oncology Patient Advocate Encounter   Received notification that patient is due for re-enrollment for assistance for Promacta  through [employer].   Re-enrollment process has been initiated and will be submitted upon completion of necessary documents.   Patient will be bringing in forms she received in the mail to her 11/21 appointment along with the first 2 pages of her tax return.   I will continue to follow until final determination.  Lucie Lamer, CPhT [place]  [facility] Oncology Pharmacy Patient Advocate Specialist [facility] [place] Phone: [phone]  Fax: [phone] [email or web address]

## 2024-08-02 ENCOUNTER — Other Ambulatory Visit: Payer: Self-pay

## 2024-08-02 DIAGNOSIS — C9110 Chronic lymphocytic leukemia of B-cell type not having achieved remission: Secondary | ICD-10-CM

## 2024-08-03 ENCOUNTER — Inpatient Hospital Stay (HOSPITAL_BASED_OUTPATIENT_CLINIC_OR_DEPARTMENT_OTHER): Admitting: Hematology

## 2024-08-03 ENCOUNTER — Inpatient Hospital Stay: Attending: Hematology

## 2024-08-03 VITALS — BP 143/62 | HR 60 | Temp 98.0°F | Resp 17 | Ht <= 58 in | Wt 94.0 lb

## 2024-08-03 DIAGNOSIS — C9110 Chronic lymphocytic leukemia of B-cell type not having achieved remission: Secondary | ICD-10-CM | POA: Insufficient documentation

## 2024-08-03 DIAGNOSIS — Z801 Family history of malignant neoplasm of trachea, bronchus and lung: Secondary | ICD-10-CM | POA: Diagnosis not present

## 2024-08-03 DIAGNOSIS — D693 Immune thrombocytopenic purpura: Secondary | ICD-10-CM | POA: Insufficient documentation

## 2024-08-03 DIAGNOSIS — M25432 Effusion, left wrist: Secondary | ICD-10-CM | POA: Insufficient documentation

## 2024-08-03 DIAGNOSIS — R21 Rash and other nonspecific skin eruption: Secondary | ICD-10-CM | POA: Insufficient documentation

## 2024-08-03 LAB — CMP (CANCER CENTER ONLY)
ALT: 15 U/L (ref 0–44)
AST: 28 U/L (ref 15–41)
Albumin: 4.5 g/dL (ref 3.5–5.0)
Alkaline Phosphatase: 71 U/L (ref 38–126)
Anion gap: 9 (ref 5–15)
BUN: 30 mg/dL — ABNORMAL HIGH (ref 8–23)
CO2: 28 mmol/L (ref 22–32)
Calcium: 9.4 mg/dL (ref 8.9–10.3)
Chloride: 102 mmol/L (ref 98–111)
Creatinine: 1.01 mg/dL — ABNORMAL HIGH (ref 0.44–1.00)
GFR, Estimated: 55 mL/min — ABNORMAL LOW (ref >60)
Glucose, Bld: 85 mg/dL (ref 70–99)
Potassium: 4.2 mmol/L (ref 3.5–5.1)
Sodium: 139 mmol/L (ref 135–145)
Total Bilirubin: 0.3 mg/dL (ref 0.0–1.2)
Total Protein: 6.9 g/dL (ref 6.5–8.1)

## 2024-08-03 LAB — CBC WITH DIFFERENTIAL (CANCER CENTER ONLY)
Abs Immature Granulocytes: 0 K/uL (ref 0.00–0.07)
Basophils Absolute: 0 K/uL (ref 0.0–0.1)
Basophils Relative: 0 %
Eosinophils Absolute: 0.2 K/uL (ref 0.0–0.5)
Eosinophils Relative: 3 %
HCT: 40.1 % (ref 36.0–46.0)
Hemoglobin: 13.8 g/dL (ref 12.0–15.0)
Immature Granulocytes: 0 %
Lymphocytes Relative: 40 %
Lymphs Abs: 2.2 K/uL (ref 0.7–4.0)
MCH: 32.6 pg (ref 26.0–34.0)
MCHC: 34.4 g/dL (ref 30.0–36.0)
MCV: 94.8 fL (ref 80.0–100.0)
Monocytes Absolute: 0.6 K/uL (ref 0.1–1.0)
Monocytes Relative: 10 %
Neutro Abs: 2.6 K/uL (ref 1.7–7.7)
Neutrophils Relative %: 47 %
Platelet Count: 98 K/uL — ABNORMAL LOW (ref 150–400)
RBC: 4.23 MIL/uL (ref 3.87–5.11)
RDW: 12.3 % (ref 11.5–15.5)
WBC Count: 5.6 K/uL (ref 4.0–10.5)
nRBC: 0 % (ref 0.0–0.2)

## 2024-08-03 LAB — LACTATE DEHYDROGENASE: LDH: 223 U/L (ref 105–235)

## 2024-08-03 NOTE — Progress Notes (Signed)
 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  HISTORY OF PRESENTING ILLNESS: Please see previous note for details on initial presentation.   SUMMARY OF ONCOLOGIC HISTORY: Oncology History  CLL (chronic lymphocytic leukemia) (HCC)  03/07/2019 Initial Diagnosis   CLL (chronic lymphocytic leukemia) (HCC)   09/19/2019 - 10/12/2019 Chemotherapy   Patient is on Treatment Plan : NON-HODGKINS LYMPHOMA Rituximab  q7d       INTERVAL HISTORY:  Selena Branch is a 83 y.o. 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 She is accompanied by her daughter today.  she was last seen by me on 04/27/2024; at the time she mentioned experiencing blood pressure fluctuates but is overall well-controlled. She otherwise denies any new symptoms and is feeling well.  Today, she reports swelling in her left wrist. She thinks she may have sprained her wrist. She also mentions a rash on her lower back. Cortisone 10 cream has been reducing the itching. She is otherwise doing well.  She denies any new infection issues, fevers/chills, abdominal pain, new bone pains, new lumps/bumps, leg swelling, or bleeding issues (nose bleeds, gum bleeds, abnormal/spontaneous bruising).  REVIEW OF SYSTEMS:   10 Point review of systems of done and is negative except as noted above.  MEDICAL HISTORY Past Medical History:  Diagnosis Date   Anxiety    CLL (chronic lymphocytic leukemia) (HCC) 11/2018   Glaucoma    Hyperlipemia    Hypertension    Osteoporosis     SURGICAL HISTORY No past surgical history on file.  SOCIAL HISTORY Social History   Tobacco Use   Smoking status: Never   Smokeless tobacco: Never   Vaping Use   Vaping status: Never Used  Substance Use Topics   Alcohol use: No    Alcohol/week: 0.0 standard drinks of alcohol   Drug use: No    Social History   Social History Narrative   Drinks 2 cups of coffee      Pt lives with husband    SOCIAL DRIVERS OF HEALTH SDOH Screenings   Food Insecurity: No Food Insecurity (03/13/2023)  Housing: Low Risk  (03/13/2023)  Transportation Needs: No Transportation Needs (03/13/2023)  Utilities: Not At Risk (03/13/2023)  Depression (PHQ2-9): Low Risk  (09/28/2019)  Tobacco Use: Low Risk  (06/27/2024)     FAMILY HISTORY Family History  Problem Relation Age of Onset   Stroke Mother    Lung cancer Father    Leukemia Neg Hx    Breast cancer Neg Hx    Ovarian cancer Neg Hx    Colon cancer Neg Hx    Endometrial cancer Neg Hx    Prostate cancer Neg Hx    Pancreatic cancer Neg Hx      ALLERGIES: is allergic to codeine, evista [raloxifene], actonel [risedronate], boniva  [ibandronate ], fosamax [alendronate], and prednisone .  MEDICATIONS  Current Outpatient Medications  Medication Sig Dispense Refill   amLODipine  (NORVASC ) 10 MG tablet Take 1 tablet (10 mg total) by mouth at bedtime. 30 tablet 0   atorvastatin  (LIPITOR) 40 MG tablet Take 40 mg by mouth daily.      Brinzolamide -Brimonidine  (SIMBRINZA ) 1-0.2 % SUSP Apply to eye. 1 drop in both eyes twice per day.     Calcium  Carb-Cholecalciferol  (CALCIUM  +  VITAMIN D3 PO) Take 1 tablet by mouth daily.     carvedilol  (COREG ) 3.125 MG tablet Take 1 tablet (3.125 mg total) by mouth 2 (two) times daily with a meal. 60 tablet 0   dorzolamide -timolol  (COSOPT ) 22.3-6.8 MG/ML ophthalmic solution Place 1 drop into both eyes 2 (two) times daily.     eltrombopag  (PROMACTA ) 25 MG tablet Take 1 tablet (25 mg) on Monday Wednesday and Friday .take on an empty stomach, 1 hour before a meal or 2 hours after. (Patient taking differently: Take 25 mg by mouth as directed. Take 1 tablet (25 mg) on Monday,  Wednesday, Thursday and Saturday. Take on an empty stomach, 1 hour before a meal or 2 hours after.) 30 tablet 11   glycerin  adult 2 g suppository Place 1 suppository rectally as needed for constipation. 12 suppository 0   hydrochlorothiazide (MICROZIDE) 12.5 MG capsule Take 12.5 mg by mouth daily.     ibandronate  (BONIVA ) 150 MG tablet Take 150 mg by mouth every 28 (twenty-eight) days.     lacosamide  (VIMPAT ) 200 MG TABS tablet Take 1 tablet (200 mg total) by mouth 2 (two) times daily. 60 tablet 5   latanoprost  (XALATAN ) 0.005 % ophthalmic solution Place 1 drop into both eyes at bedtime.      lidocaine  (XYLOCAINE ) 2 % jelly Apply 1 Application topically as needed. 30 mL 1   lisinopril  (ZESTRIL ) 40 MG tablet Take 40 mg by mouth daily.     Multiple Vitamins-Minerals (WOMENS 50+ MULTI VITAMIN/MIN) TABS Take 1 tablet by mouth daily.     ondansetron  (ZOFRAN -ODT) 4 MG disintegrating tablet Take 1 tablet (4 mg total) by mouth every 8 (eight) hours as needed for nausea or vomiting. 20 tablet 0   pantoprazole  (PROTONIX ) 40 MG tablet Take 1 tablet (40 mg total) by mouth 2 (two) times daily. 60 tablet 0   polyethylene glycol powder (GLYCOLAX /MIRALAX ) 17 GM/SCOOP powder Take 17 g by mouth daily. 255 g 0   primidone  (MYSOLINE ) 50 MG tablet TAKE 1 TABLET BY MOUTH 2 TIMES A DAY 60 tablet 5   QUEtiapine  (SEROQUEL ) 25 MG tablet Take 1 tablet (25 mg total) by mouth daily as needed (agitation).     No current facility-administered medications for this visit.    PHYSICAL EXAMINATION: ECOG PERFORMANCE STATUS: 1 - Symptomatic but completely ambulatory VITALS: Vitals:   08/03/24 0921 08/03/24 0925  BP: (!) 131/53 (!) 143/62  Pulse: 60   Resp: 17   Temp: 98 F (36.7 C)   SpO2: 100%    Filed Weights   08/03/24 0921  Weight: 94 lb (42.6 kg)   Body mass index is 19.65 kg/m.  GENERAL: alert, in no acute distress and comfortable SKIN: no acute rashes, no significant lesions EYES: conjunctiva are pink and  non-injected, sclera anicteric OROPHARYNX: MMM, no exudates, no oropharyngeal erythema or ulceration NECK: supple, no JVD LYMPH:  no palpable lymphadenopathy in the cervical, axillary or inguinal regions LUNGS: clear to auscultation b/l with normal respiratory effort HEART: regular rate & rhythm ABDOMEN:  normoactive bowel sounds , non tender, not distended, no hepatosplenomegaly Extremity: no pedal edema PSYCH: alert & oriented x 3 with fluent speech NEURO: no focal motor/sensory deficits  LABORATORY DATA:   I have reviewed the data as listed     Latest Ref Rng & Units 08/03/2024    8:52 AM 04/27/2024    8:32 AM 12/07/2023    1:35 PM  CBC EXTENDED  WBC 4.0 - 10.5 K/uL 5.6  7.2  5.5   RBC 3.87 - 5.11 MIL/uL 4.23  4.12  4.39   Hemoglobin 12.0 - 15.0 g/dL 86.1  86.5  85.7   HCT 36.0 - 46.0 % 40.1  39.1  41.7   Platelets 150 - 400 K/uL 98  100  110   NEUT# 1.7 - 7.7 K/uL 2.6  3.9  2.5   Lymph# 0.7 - 4.0 K/uL 2.2  2.5  2.4        Latest Ref Rng & Units 08/03/2024    8:52 AM 04/27/2024    8:32 AM 12/07/2023    1:35 PM  CMP  Glucose 70 - 99 mg/dL 85  92  898   BUN 8 - 23 mg/dL 30  19  18    Creatinine 0.44 - 1.00 mg/dL 8.98  9.02  9.13   Sodium 135 - 145 mmol/L 139  139  141   Potassium 3.5 - 5.1 mmol/L 4.2  3.7  4.2   Chloride 98 - 111 mmol/L 102  102  101   CO2 22 - 32 mmol/L 28  29  32   Calcium  8.9 - 10.3 mg/dL 9.4  9.3  9.5   Total Protein 6.5 - 8.1 g/dL 6.9  6.5  7.1   Total Bilirubin 0.0 - 1.2 mg/dL 0.3  0.5  0.5   Alkaline Phos 38 - 126 U/L 71  52  53   AST 15 - 41 U/L 28  23  29    ALT 0 - 44 U/L 15  15  21     . Lab Results  Component Value Date   LDH 223 08/03/2024      02/27/2019 FISH:       RADIOGRAPHIC STUDIES: I have personally reviewed the radiological images as listed and agreed with the findings in the report. No results found.  ASSESSMENT & PLAN:   83 y.o. female with  #1 CLL-currently in remission Previous CLL prognostic FISH panel without  detectable mutations. Has been Rai stage 0. 08/31/2019 CT C/A/P ([id number]) ([id number]) revealed 1. Mild multistation lymphadenopathy in the retroperitoneum and bilateral pelvis as detailed, compatible with lymphoma. Normal size spleen. No thoracic adenopathy.   #2 s/p severe thrombocytopenia with platelet counts around 5k likely related to CLL.  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  Currently no anemia.   #4 h/o intraparenchymal bleed- rpt CT head stable (due to uncontrolled HTN and thrombocytopenia)     PLAN: - Discussed lab results on 08/03/2024 in detail with patient: -WBC counts remain normal -Hemoglobin normal at 13.8 -Lymphocytes are in the normal range -Platelets are remaining stable  -LDH is stable -Blood chem shows mild dehydration -CLL remains in remission with on evidence of progression. -plt are at goal -- continue Promacta  at current dose @ 25mg  po daily -Recommends icing her wrist to reduce swelling -Recommends a topical antifungal on her rash and keeping the are dry in the case the it is a mild yeast infection -Recommends monitoring caffeine consumption to avoid dehydration -Follow-up with Dr. Onesimo in 4 months  FOLLOW-UP in 4 months for labs and follow-up with Dr. Onesimo.  The total time spent in the appointment was 25 minutes* .  All of the patient's questions were answered and the patient knows to call the clinic with any problems, questions, or concerns.  Emaline Onesimo MD MS AAHIVMS [facility] Hematology/Oncology Physician [facility]  *Total Encounter Time as defined by the Centers for Medicare and Medicaid Services includes, in addition to the face-to-face time of a patient visit (documented in the note above) non-face-to-face time: obtaining and reviewing outside history, ordering and reviewing medications, tests or procedures, care  coordination (communications with other health care professionals or caregivers) and documentation in the medical record.  I, Alan Blowers, acting as a neurosurgeon for Emaline Saran, MD.,have documented all relevant documentation on the behalf of Emaline Saran, MD,as directed by  Emaline Saran, MD while in the presence of Emaline Saran, MD.  I have reviewed the above documentation for accuracy and completeness, and I agree with the above.  Baran Kuhrt, MD

## 2024-08-14 NOTE — Telephone Encounter (Signed)
 Oral Oncology Patient Advocate Encounter   Received notification that the application for assistance for Promacta  through [employer] has been approved.   [employer] phone number [phone].   Effective dates: 09/13/24 through 09/12/25  Medication will be filled at [facility].  I have spoken to the patient.  Lucie Lamer, CPhT [place]  [facility] Oncology Pharmacy Patient Advocate Specialist [facility] [place] Phone: [phone]  Fax: [phone] [email or web address]

## 2024-08-14 NOTE — Telephone Encounter (Signed)
 Oral Oncology Patient Advocate Encounter  I have faxed in documentation to [employer] today.  Lucie Lamer, CPhT [place]  [facility] Oncology Pharmacy Patient Advocate Specialist [facility] [place] Phone: [phone]  Fax: [phone] [email or web address]

## 2024-09-11 ENCOUNTER — Other Ambulatory Visit: Payer: Self-pay

## 2024-09-11 DIAGNOSIS — C9110 Chronic lymphocytic leukemia of B-cell type not having achieved remission: Secondary | ICD-10-CM

## 2024-09-11 MED ORDER — ELTROMBOPAG OLAMINE 25 MG PO TABS: ORAL_TABLET | ORAL | 11 refills | Status: DC

## 2024-09-12 ENCOUNTER — Other Ambulatory Visit (HOSPITAL_COMMUNITY): Payer: Self-pay

## 2024-09-14 ENCOUNTER — Other Ambulatory Visit: Payer: Self-pay

## 2024-09-14 DIAGNOSIS — C9110 Chronic lymphocytic leukemia of B-cell type not having achieved remission: Secondary | ICD-10-CM

## 2024-09-14 MED ORDER — ELTROMBOPAG OLAMINE 25 MG PO TABS: ORAL_TABLET | ORAL | 11 refills | Status: AC

## 2024-11-06 ENCOUNTER — Ambulatory Visit: Payer: PPO | Admitting: Diagnostic Neuroimaging

## 2024-11-30 ENCOUNTER — Inpatient Hospital Stay: Admitting: Hematology

## 2024-11-30 ENCOUNTER — Inpatient Hospital Stay: Attending: Hematology
